# Patient Record
Sex: Female | Born: 1940 | Race: White | Hispanic: No | Smoking: Former smoker
Health system: Southern US, Community
[De-identification: ages and names within clinical notes are randomized; demographics above are authoritative.]

## PROBLEM LIST (undated history)

## (undated) DIAGNOSIS — I639 Cerebral infarction, unspecified: Secondary | ICD-10-CM

## (undated) DIAGNOSIS — D649 Anemia, unspecified: Secondary | ICD-10-CM

## (undated) DIAGNOSIS — E507 Other ocular manifestations of vitamin A deficiency: Secondary | ICD-10-CM

## (undated) DIAGNOSIS — R079 Chest pain, unspecified: Secondary | ICD-10-CM

## (undated) DIAGNOSIS — I776 Arteritis, unspecified: Secondary | ICD-10-CM

## (undated) DIAGNOSIS — E039 Hypothyroidism, unspecified: Secondary | ICD-10-CM

## (undated) DIAGNOSIS — H539 Unspecified visual disturbance: Secondary | ICD-10-CM

## (undated) DIAGNOSIS — I4891 Unspecified atrial fibrillation: Secondary | ICD-10-CM

## (undated) DIAGNOSIS — K219 Gastro-esophageal reflux disease without esophagitis: Secondary | ICD-10-CM

## (undated) DIAGNOSIS — E785 Hyperlipidemia, unspecified: Secondary | ICD-10-CM

## (undated) DIAGNOSIS — I10 Essential (primary) hypertension: Secondary | ICD-10-CM

## (undated) HISTORY — DX: Essential (primary) hypertension: I10

## (undated) HISTORY — PX: TONSILLECTOMY: SUR1361

## (undated) HISTORY — PX: CHOLECYSTECTOMY: SHX55

## (undated) HISTORY — DX: Hypothyroidism, unspecified: E03.9

## (undated) HISTORY — PX: OTHER SURGICAL HISTORY: SHX169

## (undated) HISTORY — DX: Chest pain, unspecified: R07.9

## (undated) HISTORY — DX: Unspecified visual disturbance: H53.9

## (undated) HISTORY — DX: Hyperlipidemia, unspecified: E78.5

## (undated) HISTORY — DX: Cerebral infarction, unspecified: I63.9

---

## 2016-08-21 DIAGNOSIS — I639 Cerebral infarction, unspecified: Secondary | ICD-10-CM

## 2016-08-21 HISTORY — DX: Cerebral infarction, unspecified: I63.9

## 2016-12-07 ENCOUNTER — Encounter: Payer: Self-pay | Admitting: *Deleted

## 2017-01-01 ENCOUNTER — Encounter: Payer: Self-pay | Admitting: Physician Assistant

## 2017-01-01 ENCOUNTER — Encounter (INDEPENDENT_AMBULATORY_CARE_PROVIDER_SITE_OTHER): Payer: Self-pay

## 2017-01-01 ENCOUNTER — Ambulatory Visit (INDEPENDENT_AMBULATORY_CARE_PROVIDER_SITE_OTHER): Payer: Medicare Other | Admitting: Physician Assistant

## 2017-01-01 VITALS — BP 130/74 | HR 94 | Ht 67.0 in | Wt 122.4 lb

## 2017-01-01 DIAGNOSIS — I10 Essential (primary) hypertension: Secondary | ICD-10-CM | POA: Insufficient documentation

## 2017-01-01 DIAGNOSIS — I48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 atrial fibrillation: Secondary | ICD-10-CM | POA: Insufficient documentation

## 2017-01-01 DIAGNOSIS — R0602 Shortness of breath: Secondary | ICD-10-CM | POA: Diagnosis not present

## 2017-01-01 DIAGNOSIS — R600 Localized edema: Secondary | ICD-10-CM | POA: Diagnosis not present

## 2017-01-01 DIAGNOSIS — E039 Hypothyroidism, unspecified: Secondary | ICD-10-CM | POA: Insufficient documentation

## 2017-01-01 DIAGNOSIS — I639 Cerebral infarction, unspecified: Secondary | ICD-10-CM

## 2017-01-01 DIAGNOSIS — I481 Persistent atrial fibrillation: Secondary | ICD-10-CM

## 2017-01-01 DIAGNOSIS — M341 CR(E)ST syndrome: Secondary | ICD-10-CM | POA: Diagnosis not present

## 2017-01-01 DIAGNOSIS — E785 Hyperlipidemia, unspecified: Secondary | ICD-10-CM | POA: Insufficient documentation

## 2017-01-01 MED ORDER — CARVEDILOL 12.5 MG PO TABS: 18.7500 mg | ORAL_TABLET | Freq: Two times a day (BID) | ORAL | 3 refills | Status: DC

## 2017-01-01 NOTE — Patient Instructions (Addendum)
Medication Instructions:  Your physician has recommended you make the following change in your medication:  1.  INCREASE the Coreg to 12.5 mg taking 1 1/2 tablet twice a day  Labwork: None orderd  Testing/Procedures: Your physician has requested that you have an echocardiogram 01/08/17 ARRIVE AT 2:45. Echocardiography is a painless test that uses sound waves to create images of your heart. It provides your doctor with information about the size and shape of your heart and how well your heart's chambers and valves are working. This procedure takes approximately one hour. There are no restrictions for this procedure.    Follow-Up: Your physician recommends that you schedule a follow-up appointment in: 01/14/17 ARRIVE AT 11:30  MICHELE LENZE, PA-C   Any Other Special Instructions Will Be Listed Below (If Applicable). Echocardiogram An echocardiogram, or echocardiography, uses sound waves (ultrasound) to produce an image of your heart. The echocardiogram is simple, painless, obtained within a short period of time, and offers valuable information to your health care provider. The images from an echocardiogram can provide information such as:  Evidence of coronary artery disease (CAD).  Heart size.  Heart muscle function.  Heart valve function.  Aneurysm detection.  Evidence of a past heart attack.  Fluid buildup around the heart.  Heart muscle thickening.  Assess heart valve function.  Tell a health care provider about:  Any allergies you have.  All medicines you are taking, including vitamins, herbs, eye drops, creams, and over-the-counter medicines.  Any problems you or family members have had with anesthetic medicines.  Any blood disorders you have.  Any surgeries you have had.  Any medical conditions you have.  Whether you are pregnant or may be pregnant. What happens before the procedure? No special preparation is needed. Eat and drink normally. What happens  during the procedure?  In order to produce an image of your heart, gel will be applied to your chest and a wand-like tool (transducer) will be moved over your chest. The gel will help transmit the sound waves from the transducer. The sound waves will harmlessly bounce off your heart to allow the heart images to be captured in real-time motion. These images will then be recorded.  You may need an IV to receive a medicine that improves the quality of the pictures. What happens after the procedure? You may return to your normal schedule including diet, activities, and medicines, unless your health care provider tells you otherwise. This information is not intended to replace advice given to you by your health care provider. Make sure you discuss any questions you have with your health care provider. Document Released: 04/06/2000 Document Revised: 11/26/2015 Document Reviewed: 12/15/2012 Elsevier Interactive Patient Education  2017 [employer]     If you need a refill on your cardiac medications before your next appointment, please call your pharmacy.

## 2017-01-01 NOTE — Progress Notes (Signed)
Cardiology Office Note    Date:  01/01/2017   ID:  [name], DOB 1940-09-28, MRN [id number]  PCP:  No primary care provider on file.  Cardiologist: New  Chief Complaint  Patient presents with  . New Patient (Initial Visit)    History of Present Illness:    Kathryn DollySharon Dermody is a 76 y.o. female who is being seen today for the evaluation of Atrial fibrillation, HTN,? CHF at the request of Alois Clicheguilar, [place], [place].  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 Patient is living in long-term care facility and hasn't seen a cardiologist in a long time. She was diagnosed with atrial fibrillation and placed on Eliquis 5 mg BID. She was seen by primary care in the long-term care facility and had some peripheral edema and was referred for question of heart failure.  Back in 07/2016 the patient had traumatic brain injury With CVA where she fell home. She was living with her husband at the time of had dementia and did not call EMS right away. It's unknown how long she had a stroke before she was sent to the ER where she was diagnosed with ischemic right MCA stroke. In 09/2016 she suffered another stroke was diagnosed with aphasia. All of the history is taken from referral records.  Patient comes in today alone. Has memory problems and can't tell me where she lived before or if she's had any heart problems. Has no memory whatsoever. Denies any chest pain, palpitations, dyspnea, dyspnea on exertion, dizziness or presyncope. She works with  rehabilitation and says her son lives in town. She says they tell her she short of breath but she doesn't feel like she is short of breath. She doesn't feels her heart racing at all. She doesn't know that she's had atrial fibrillation.  Past Medical History:  Diagnosis Date  . Chest pain syndrome   . Hyperlipidemia   . Hypertension   . Hypothyroidism     Past  Surgical History:  Procedure Laterality Date  . TONSILLECTOMY      Current Medications: Current Meds  Medication Sig  . apixaban (ELIQUIS) 5 MG TABS tablet Take 5 mg by mouth 2 (two) times daily.  Marland Kitchen. aspirin EC 81 MG tablet Take 81 mg by mouth daily.  Marland Kitchen. atorvastatin (LIPITOR) 40 MG tablet Take 40 mg by mouth daily.  . calcium carbonate (OS-CAL - DOSED IN MG OF ELEMENTAL CALCIUM) 1250 (500 Ca) MG tablet Take 1 tablet by mouth daily with breakfast.  . ergocalciferol (VITAMIN D2) 50000 units capsule Take 50,000 Units by mouth once a week.  . ferrous sulfate 325 (65 FE) MG tablet Take 325 mg by mouth daily with breakfast.  . Levothyroxine Sodium (TIROSINT) 88 MCG CAPS Take 88 mcg by mouth daily before breakfast.  . ramipril (ALTACE) 5 MG capsule Take 5 mg by mouth daily.  . vitamin B-12 (CYANOCOBALAMIN) 500 MCG tablet Take 500 mcg by mouth daily.  . [DISCONTINUED] carvedilol (COREG) 12.5 MG tablet Take 12.5 mg by mouth 2 (two) times daily with a meal.     Allergies:   Sulfa antibiotics   Social History   Social History  . Marital status: Widowed    Spouse name: N/A  . Number of children: N/A  . Years of education: N/A   Social History Main Topics  . Smoking status: Former Games developermoker  . Smokeless tobacco: Never Used  . Alcohol use No  .  Drug use: No  . Sexual activity: Not Asked   Other Topics Concern  . None   Social History Narrative  . None     Family History:  The patientFamily history is unknown by patient.   ROS:   Please see the history of present illness.    Review of Systems  Reason unable to perform ROS: Patient had a stroke and can't answer these questions.   All other systems reviewed and are negative.   PHYSICAL EXAM:   VS:  BP 130/74   Pulse 94   Ht  (1.702 m)   Wt 122 lb 6.4 oz (55.5 kg)   SpO2 90%   BMI 19.17 kg/m   Physical Exam  GEN: Well nourished, well developed, in no acute distress  HEENT: normal  Neck: no JVD, carotid bruits, or  masses Cardiac:Irregular irregular with 2/6 systolic murmur at the left sternal border no rubs, or gallops  Respiratory:  clear to auscultation bilaterally, normal work of breathing GI: soft, nontender, nondistended, + BS Ext: +1 edema bilaterally without cyanosis, clubbing. Good distal pulses bilaterally MS: no deformity or atrophy  Neuro:  Alert slightly a phasic but can speak pretty well. Discussed has no memory Psych: euthymic mood   Wt Readings from Last 3 Encounters:  01/01/17 122 lb 6.4 oz (55.5 kg)      Studies/Labs Reviewed:   EKG:  EKG is  ordered today.  The ekg ordered today demonstrates Atrial fibrillation at 94 bpm  Recent Labs: No results found for requested labs within last 8760 hours.   Lipid Panel No results found for: CHOL, TRIG, HDL, CHOLHDL, VLDL, LDLCALC, LDLDIRECT  Additional studies/ records that were reviewed today include:  Records reviewed from nursing home.    ASSESSMENT:    1. Lower extremity edema   2. SOB (shortness of breath)   3. Persistent atrial fibrillation (HCC)   4. Essential hypertension   5. Cerebrovascular accident (CVA), unspecified mechanism (HCC)   6. CREST syndrome (HCC)   7. Hyperlipidemia, unspecified hyperlipidemia type      PLAN:  In order of problems listed above:  Lower extremity edema and shortness of breath but patient doesn't complain of this. History taken from notes from nursing facility. Patient apparently has a prior cardiac history but has no recall because of her CVA and had no one accompany her today. Discussed in detail with Dr.Nahser she could have some edema because her atrial fibrillation heart rate is not controlled. Will increase Coreg to 18.75 mg twice a day. Check 2-D echo for LV function. Come back for follow-up after echo. Hopefully a family member will accompany her.  Atrial fibrillation is to be chronic rate a little fast. Will increase Coreg as dictated above. Continue Eliquis.  Essential  hypertension blood pressure controlled on low-dose Altace  CVA 24/2018 in 09/2016 notes indicate ischemic right MCA stroke but there are no records in care everywhere and she doesn't know where she was living her hospitalized. On aspirin as well   Crest syndrome managed by primary care  Hyperlipidemia on Lipitor managed by primary care.    Medication Adjustments/Labs and Tests Ordered: Current medicines are reviewed at length with the patient today.  Concerns regarding medicines are outlined above.  Medication changes, Labs and Tests ordered today are listed in the Patient Instructions below. Patient Instructions  Medication Instructions:  Your physician has recommended you make the following change in your medication:  1.  INCREASE the Coreg to 12.5 mg taking  1 1/2 tablet twice a day  Labwork: None orderd  Testing/Procedures: Your physician has requested that you have an echocardiogram 01/08/17 ARRIVE AT 2:45. Echocardiography is a painless test that uses sound waves to create images of your heart. It provides your doctor with information about the size and shape of your heart and how well your heart's chambers and valves are working. This procedure takes approximately one hour. There are no restrictions for this procedure.    Follow-Up: Your physician recommends that you schedule a follow-up appointment in: 01/14/17 ARRIVE AT 11:30  MICHELE LENZE, PA-C   Any Other Special Instructions Will Be Listed Below (If Applicable). Echocardiogram An echocardiogram, or echocardiography, uses sound waves (ultrasound) to produce an image of your heart. The echocardiogram is simple, painless, obtained within a short period of time, and offers valuable information to your health care provider. The images from an echocardiogram can provide information such as:  Evidence of coronary artery disease (CAD).  Heart size.  Heart muscle function.  Heart valve function.  Aneurysm  detection.  Evidence of a past heart attack.  Fluid buildup around the heart.  Heart muscle thickening.  Assess heart valve function.  Tell a health care provider about:  Any allergies you have.  All medicines you are taking, including vitamins, herbs, eye drops, creams, and over-the-counter medicines.  Any problems you or family members have had with anesthetic medicines.  Any blood disorders you have.  Any surgeries you have had.  Any medical conditions you have.  Whether you are pregnant or may be pregnant. What happens before the procedure? No special preparation is needed. Eat and drink normally. What happens during the procedure?  In order to produce an image of your heart, gel will be applied to your chest and a wand-like tool (transducer) will be moved over your chest. The gel will help transmit the sound waves from the transducer. The sound waves will harmlessly bounce off your heart to allow the heart images to be captured in real-time motion. These images will then be recorded.  You may need an IV to receive a medicine that improves the quality of the pictures. What happens after the procedure? You may return to your normal schedule including diet, activities, and medicines, unless your health care provider tells you otherwise. This information is not intended to replace advice given to you by your health care provider. Make sure you discuss any questions you have with your health care provider. Document Released: 04/06/2000 Document Revised: 11/26/2015 Document Reviewed: 12/15/2012 Elsevier Interactive Patient Education  2017 [employer].     If you need a refill on your cardiac medications before your next appointment, please call your pharmacy.      Signed, Jacolyn Reedy, PA-C  01/01/2017 2:12 PM    [facility] [address] [place], [place], [place]  [address] Phone: [phone]; Fax: [phone]

## 2017-01-08 ENCOUNTER — Other Ambulatory Visit (HOSPITAL_COMMUNITY): Payer: Medicare Other

## 2017-01-14 ENCOUNTER — Ambulatory Visit: Payer: Medicare Other | Admitting: Physician Assistant

## 2017-01-22 ENCOUNTER — Encounter: Payer: Self-pay | Admitting: Neurology

## 2017-01-22 ENCOUNTER — Ambulatory Visit (INDEPENDENT_AMBULATORY_CARE_PROVIDER_SITE_OTHER): Payer: Medicare Other | Admitting: Neurology

## 2017-01-22 VITALS — BP 96/54 | HR 64 | Ht 67.0 in | Wt 115.8 lb

## 2017-01-22 DIAGNOSIS — I63411 Cerebral infarction due to embolism of right middle cerebral artery: Secondary | ICD-10-CM | POA: Diagnosis not present

## 2017-01-22 NOTE — Patient Instructions (Signed)
Remember to drink plenty of fluid, eat healthy meals and do not skip any meals. Try to eat protein with a every meal and eat a healthy snack such as fruit or nuts in between meals. Try to keep a regular sleep-wake schedule and try to exercise daily, particularly in the form of walking, 20-30 minutes a day, if you can.   As far as your medications are concerned, I would like to suggest: Continue current medications  I would like to see you back in 1 year, sooner if we need to. Please call us with any interim questions, concerns, problems, updates or refill requests.   Our phone number is [phone]. We also have an after hours call service for urgent matters and there is a physician on-call for urgent questions. For any emergencies you know to call 911 or go to the nearest emergency room   

## 2017-01-22 NOTE — Progress Notes (Signed)
[facility]    Provider:  Dr Lucia Gaskins Referring Provider: Alois Cliche, PA-C Primary Care Physician:  Alois Cliche, PA-C  CC:  PMHx stroke, already on Eliquis and Aspirin  HPI:  [name] is a 75 y.o. female here as a referral from Dr. Cephus Richer for strokes. 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 Patient is on long-term anticoagulation with Apixaban and is also on aspirin. She also has a past medical history of crest syndrome, she has not seen a rheumatologist for years. 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 She has hypertension, hypothyroidism, hyperlipidemia, trouble swallowing since her last stroke, atrial fibrillation and embolus to the middle cerebral artery, frequent falls while walking, gait abnormality,. She is on Lipitor for hyperlipidemia and is managed with hypertension medications. I have very limited documentation. She doesn't appear to be having new symptoms and is currently on anticoagulation and antiplatelet therapy which would be very difficult to improve on. Patient suffers from mild aphasia secondary to CVA and possibly memory loss sp limited history. Most information from chart. Patient says she had her stroke somewhere in [place], she doesn't remember. She feels she is doing well, she feels very well. She has a lack of being able "to hear everything on her brain", She can't remember the name of her facility. She is [facility]. She feels depressed. Otherwise feels her strength has improved. No other focal neurologic deficits, associated symptoms, inciting events or modifiable factors. Patient is here alone.  Reviewed notes, labs and imaging from outside physicians, which showed:  Personally reviewed notes, patient has suffered from aphasia secondary to CVA which is chronic. Patient had a traumatic brain injury when she fell at home in April 2018, she was living  with her husband at the time who has dementia and he did not call EMS right away did not realize what was going on, it unknown how long she had the stroke before she was sent to the emergency room, she was diagnosed with an ischemic right MCA stroke. She has crest syndrome. She has hypertension. She has not seen a rheumatologist for years. Patient has hypothyroidism as well and several other medical problems listed above in HPI. PT OT services are ordered. Speech therapy and swallow evaluation has been ordered. Occupational therapy has been ordered for evaluation and treatment of ADLs. She was placed on a mechanical soft diet, she was referred to neurology for embolic ischemic stroke. She was also referred to cardiology, ophthalmology and rheumatology.  Review of Systems: Patient complains of symptoms per HPI as well as the following symptoms: difficulty walking. Pertinent negatives and positives per HPI. All others negative.   Social History   Social History  . Marital status: Legally Separated    Spouse name: N/A  . Number of children: 2  . Years of education: 16   Occupational History  .      retired   Social History Main Topics  . Smoking status: Former Games developer  . Smokeless tobacco: Never Used  . Alcohol use No  . Drug use: No  . Sexual activity: Not on file   Other Topics Concern  . Not on file   Social History Narrative   01/22/17 lives at [facility]    Family History  Problem Relation Age of Onset  . Family history unknown: Yes    Past Medical History:  Diagnosis Date  . Chest pain syndrome   . Hyperlipidemia   .  Hypertension   . Hypothyroidism   . Stroke (HCC) 08/2016  . Vision abnormalities    as result of stroke    Past Surgical History:  Procedure Laterality Date  . CHOLECYSTECTOMY    . leg surgery    . TONSILLECTOMY      Current Outpatient Prescriptions  Medication Sig Dispense Refill  . apixaban (ELIQUIS) 5 MG TABS tablet Take 5 mg by mouth 2  (two) times daily.    Marland Kitchen aspirin EC 81 MG tablet Take 81 mg by mouth daily.    Marland Kitchen atorvastatin (LIPITOR) 40 MG tablet Take 40 mg by mouth daily.    . calcium carbonate (OS-CAL - DOSED IN MG OF ELEMENTAL CALCIUM) 1250 (500 Ca) MG tablet Take 1 tablet by mouth daily with breakfast.    . carvedilol (COREG) 12.5 MG tablet Take 1.5 tablets (18.75 mg total) by mouth 2 (two) times daily. 180 tablet 3  . ergocalciferol (VITAMIN D2) 50000 units capsule Take 50,000 Units by mouth once a week.    . ferrous sulfate 325 (65 FE) MG tablet Take 325 mg by mouth daily with breakfast.    . Levothyroxine Sodium (TIROSINT) 88 MCG CAPS Take 88 mcg by mouth daily before breakfast.    . Loperamide HCl (IMODIUM A-D PO) Take by mouth as needed.    . ramipril (ALTACE) 5 MG capsule Take 5 mg by mouth daily.    . vitamin B-12 (CYANOCOBALAMIN) 500 MCG tablet Take 500 mcg by mouth daily.     No current facility-administered medications for this visit.     Allergies as of 01/22/2017 - Review Complete 01/22/2017  Allergen Reaction Noted  . Sulfa antibiotics Other (See Comments) 01/01/2017    Vitals: BP (!) 96/54   Pulse 64   Ht  (1.702 m)   Wt 115 lb 12.8 oz (52.5 kg)   BMI 18.14 kg/m  Last Weight:  Wt Readings from Last 1 Encounters:  01/22/17 115 lb 12.8 oz (52.5 kg)   Last Height:   Ht Readings from Last 1 Encounters:  01/22/17  (1.702 m)   Physical exam: Exam: Gen: NAD, conversan                 CV: irregular, no MRG. No Carotid Bruits. No peripheral edema, warm, nontender Eyes: Conjunctivae clear without exudates or hemorrhage  Neuro: Detailed Neurologic Exam  Speech:    She has some expressive and receptive aphasia however able to have a conversation and discuss how she feels Cognition:    The patient is oriented to person only    recent and remote memory Impaired;     Impaired attention, concentration,fund of knowledge  .mmse Cranial Nerves:    The pupils are equal, round, and  reactive to light. Attempted fundoscopic exam could not visualize. left homonomous hemianopia.. Extraocular movements are intact. Trigeminal sensation is intact and the muscles of mastication are normal. The face is symmetric. The palate elevates in the midline. Hearing intact. Voice is normal. Shoulder shrug is normal. The tongue has normal motion without fasciculations.   Coordination and gait: No dymetria,  Impaired, needs walker, good strides and stable with walker however small steps and imbalance without walker.  Motor Observation:    no involuntary movements noted. Tone:    Normal muscle tone.    Posture:    erect    Strength:    Strength is dimoinished throughout 3+-4/5 moreso on the left, difficult motor exam due to aphasia and cognitive impairments  Sensation: intact to LT     Reflex Exam:  DTR's:    Deep tendon reflexes in the upper and lower extremities are symmetrical bilaterally.   Toes:    The toes are equival bilaterally.   Clonus:    Clonus is absent.       Assessment/Plan:     76 y.o. female here as a refe60rral from Dr. Cephus Richer for strokes. 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   Hypotension: She is hypotensive today, needs evaluation of her blood pressure medications and monitoring of fluids Swallowing difficult: She is on a soft diet and is being referred to speech and swallow Weakness and difficulty with ADLs: She has PT and OT currently Aphasia: Speech therapy Afib: Continue Eliquis, cardilogy was consulted by pcp Stroke prevention: Continue Eliquis and asa.  Fall precautions  F/u one year.  Cc: Alois Cliche, PA-C  Naomie Dean, MD  [facility] [address] [place], [place] [address]  Phone [phone] Fax [phone]

## 2017-01-24 ENCOUNTER — Other Ambulatory Visit (HOSPITAL_COMMUNITY): Payer: Medicare Other

## 2017-01-29 ENCOUNTER — Other Ambulatory Visit: Payer: Self-pay

## 2017-01-29 ENCOUNTER — Ambulatory Visit (HOSPITAL_COMMUNITY): Payer: Medicare Other | Attending: Internal Medicine

## 2017-01-29 DIAGNOSIS — I10 Essential (primary) hypertension: Secondary | ICD-10-CM | POA: Diagnosis not present

## 2017-01-29 DIAGNOSIS — R600 Localized edema: Secondary | ICD-10-CM | POA: Insufficient documentation

## 2017-01-29 DIAGNOSIS — E785 Hyperlipidemia, unspecified: Secondary | ICD-10-CM | POA: Insufficient documentation

## 2017-01-29 DIAGNOSIS — I081 Rheumatic disorders of both mitral and tricuspid valves: Secondary | ICD-10-CM | POA: Diagnosis not present

## 2017-01-29 DIAGNOSIS — Z87891 Personal history of nicotine dependence: Secondary | ICD-10-CM | POA: Insufficient documentation

## 2017-01-29 DIAGNOSIS — R0602 Shortness of breath: Secondary | ICD-10-CM

## 2017-01-29 DIAGNOSIS — R079 Chest pain, unspecified: Secondary | ICD-10-CM | POA: Insufficient documentation

## 2017-01-29 DIAGNOSIS — Z8673 Personal history of transient ischemic attack (TIA), and cerebral infarction without residual deficits: Secondary | ICD-10-CM | POA: Insufficient documentation

## 2017-01-30 ENCOUNTER — Telehealth: Payer: Self-pay | Admitting: Physician Assistant

## 2017-01-30 NOTE — Telephone Encounter (Signed)
-----   Message from Dyann Kief, PA-C sent at 01/30/2017  7:50 AM EDT ----- 2-D echo shows good heart function, mild LVH. Keep follow-up.

## 2017-01-30 NOTE — Telephone Encounter (Signed)
New message    Pt son is returning call.

## 2017-01-30 NOTE — Telephone Encounter (Signed)
Patient's son (DPR on file) made aware of results. Son verbalizes understanding and plans to keep follow up appointment on 10/16.

## 2017-02-04 NOTE — Progress Notes (Signed)
Cardiology Office Note    Date:  02/05/2017   ID:  Rickey Farrier, DOB 1940/06/30, MRN [id number]  PCP:  Patient, No Pcp Per  Cardiologist:  New Dr. Elease Hashimoto  Chief Complaint  Patient presents with  . Atrial Fibrillation    History of Present Illness:  [name] is a 76 y.o. female who I saw for the first time 01/01/17 for evaluation of atrial fibrillation and question of CHF. 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 She is living in a long-term care facility and was diagnosed with atrial fibrillation and placed on Eliquis 5 mg BID. 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 Her husband with dementia did not call EMS right away. It's unknown how long she had a stroke before she went to the emergency room. In 09/2016 she suffered another stroke and was diagnosed with a fascia.  When I saw her she came to the office alone. She has memory problems and couldn't tell me anything. All history was taken from notes from nursing facility. I discussed her with Dr. Elease Hashimoto who felt she could have some edema because her atrial fibrillation heart rate was not controlled. We increased her Coreg to 18.75 mg twice a day and ordered a 2-D echo which showed normal LVEF 55-60%, mild LVH, mild MR, moderate LAE.   Patient comes into the office today alone once again. Her driver brought the records of another patient so she has no records with her. The driver left. She says she sometimes has anxiety and has to calm herself down but overall she denies any fast heart rates, swelling, shortness of breath. We called [facility] and they don't take her pulse only her blood pressure. We have no orders to sign.     Past Medical History:  Diagnosis Date  . Chest pain syndrome   . Hyperlipidemia   . Hypertension   . Hypothyroidism   . Stroke (HCC) 08/2016  . Vision abnormalities    as result of stroke     Past Surgical History:  Procedure Laterality Date  . CHOLECYSTECTOMY    . leg surgery    . TONSILLECTOMY      Current Medications: Current Meds  Medication Sig  . apixaban (ELIQUIS) 5 MG TABS tablet Take 5 mg by mouth 2 (two) times daily.  Marland Kitchen aspirin EC 81 MG tablet Take 81 mg by mouth daily.  Marland Kitchen atorvastatin (LIPITOR) 40 MG tablet Take 40 mg by mouth daily.  . calcium carbonate (OS-CAL - DOSED IN MG OF ELEMENTAL CALCIUM) 1250 (500 Ca) MG tablet Take 1 tablet by mouth daily with breakfast.  . carvedilol (COREG) 12.5 MG tablet Take 1.5 tablets (18.75 mg total) by mouth 2 (two) times daily.  . ergocalciferol (VITAMIN D2) 50000 units capsule Take 50,000 Units by mouth once a week.  . ferrous sulfate 325 (65 FE) MG tablet Take 325 mg by mouth daily with breakfast.  . Levothyroxine Sodium (TIROSINT) 88 MCG CAPS Take 88 mcg by mouth daily before breakfast.  . Loperamide HCl (IMODIUM A-D PO) Take by mouth as needed.  . vitamin B-12 (CYANOCOBALAMIN) 500 MCG tablet Take 500 mcg by mouth daily.  . [DISCONTINUED] ramipril (ALTACE) 5 MG capsule Take 5 mg by mouth daily.     Allergies:   Sulfa antibiotics   Social History   Social History  . Marital status: Legally Separated  Spouse name: N/A  . Number of children: 2  . Years of education: 16   Occupational History  .      retired   Social History Main Topics  . Smoking status: Former Games developer  . Smokeless tobacco: Never Used  . Alcohol use No  . Drug use: No  . Sexual activity: Not Asked   Other Topics Concern  . None   Social History Narrative   01/22/17 lives at [facility]     Family History:  The patient's   Family history is unknown by patient.   ROS:   Please see the history of present illness.    Review of Systems  Reason unable to perform ROS: CVA.   All other systems reviewed and are negative.   PHYSICAL EXAM:   VS:  BP (!) 80/60   Pulse 72   Resp 16   Ht  (1.676 m)   Wt 116 lb (52.6 kg)   BMI  18.72 kg/m   Physical Exam  GEN: Thin, in no acute distress  Neck: no JVD, carotid bruits, or masses Cardiac:irreg; no murmurs, rubs, or gallops  Respiratory:  clear to auscultation bilaterally, normal work of breathing GI: soft, nontender, nondistended, + BS Ext: without cyanosis, clubbing, or edema, Good distal pulses bilaterally Neuro:  Alert and Oriented x 3, Strength and sensation are intact Psych: euthymic mood, full affect  Wt Readings from Last 3 Encounters:  02/05/17 116 lb (52.6 kg)  01/22/17 115 lb 12.8 oz (52.5 kg)  01/01/17 122 lb 6.4 oz (55.5 kg)      Studies/Labs Reviewed:   EKG:  EKG is not ordered today.    Recent Labs: No results found for requested labs within last 8760 hours.   Lipid Panel No results found for: CHOL, TRIG, HDL, CHOLHDL, VLDL, LDLCALC, LDLDIRECT  Additional studies/ records that were reviewed today include:  2-D echo 10/9/18Study Conclusions   - Left ventricle: The cavity size was normal. Wall thickness was   increased in a pattern of mild LVH. Systolic function was normal.   The estimated ejection fraction was in the range of 55% to 60%.   Wall motion was normal; there were no regional wall motion   abnormalities. The study is not technically sufficient to allow   evaluation of LV diastolic function. - Mitral valve: Mildly thickened leaflets . There was mild   regurgitation. - Left atrium: Moderately dilated. - Right atrium: The atrium was mildly dilated. - Tricuspid valve: There was mild regurgitation. - Pulmonary arteries: PA peak pressure: 18 mm Hg (S). - Inferior vena cava: The vessel was normal in size. The   respirophasic diameter changes were in the normal range (>= 50%),   consistent with normal central venous pressure.   Impressions:   - LVEF 55-60%, mild LVH, normal wall motion, mild MR, moderate LAE,   mild RAE, mild TR, RVSP 18 mmHg, normal IVC.     ASSESSMENT:    1. Persistent atrial fibrillation (HCC)   2.  Essential hypertension   3. 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 CREST syndrome (HCC)   5. Hyperlipidemia, unspecified hyperlipidemia type      PLAN:  In order of problems listed above:  Atrial fibrillation on Eliquis and Coreg 18.75 mg BID controlled rate. F/U as needed. Can be monitored by nursing facility.  Essential hypertension blood pressure low decrease Altace to 2.5 mg daily  Embolic stroke with aphasia  CREST syndrome managed by  primary care.  Hyperlipidemia on lipitor    Medication Adjustments/Labs and Tests Ordered: Current medicines are reviewed at length with the patient today.  Concerns regarding medicines are outlined above.  Medication changes, Labs and Tests ordered today are listed in the Patient Instructions below. There are no Patient Instructions on file for this visit.   Elson Clan, PA-C  02/05/2017 10:47 AM    [facility] [address] [place], [place], [place]  [address] Phone: [phone]; Fax: [phone]

## 2017-02-05 ENCOUNTER — Ambulatory Visit (INDEPENDENT_AMBULATORY_CARE_PROVIDER_SITE_OTHER): Payer: Medicare Other | Admitting: Physician Assistant

## 2017-02-05 ENCOUNTER — Encounter: Payer: Self-pay | Admitting: Physician Assistant

## 2017-02-05 VITALS — BP 80/60 | HR 72 | Resp 16 | Ht 66.0 in | Wt 116.0 lb

## 2017-02-05 DIAGNOSIS — I481 Persistent atrial fibrillation: Secondary | ICD-10-CM

## 2017-02-05 DIAGNOSIS — I63411 Cerebral infarction due to embolism of right middle cerebral artery: Secondary | ICD-10-CM | POA: Diagnosis not present

## 2017-02-05 DIAGNOSIS — M341 CR(E)ST syndrome: Secondary | ICD-10-CM

## 2017-02-05 DIAGNOSIS — E785 Hyperlipidemia, unspecified: Secondary | ICD-10-CM

## 2017-02-05 DIAGNOSIS — I10 Essential (primary) hypertension: Secondary | ICD-10-CM

## 2017-02-05 DIAGNOSIS — I48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 atrial fibrillation: Secondary | ICD-10-CM

## 2017-02-05 MED ORDER — RAMIPRIL 2.5 MG PO CAPS: 2.5000 mg | ORAL_CAPSULE | Freq: Every day | ORAL | 3 refills | Status: DC

## 2017-02-05 NOTE — Patient Instructions (Signed)
Your physician has recommended you make the following change in your medication:  1) change ramipril (Altace) to 2.5 mg once a day  Check pulse with blood pressure daily   Your physician recommends that you schedule a follow-up appointment as needed.

## 2017-08-29 ENCOUNTER — Other Ambulatory Visit (HOSPITAL_COMMUNITY): Payer: Self-pay | Admitting: Family Medicine

## 2017-08-29 DIAGNOSIS — R1319 Other dysphagia: Secondary | ICD-10-CM

## 2017-09-05 ENCOUNTER — Ambulatory Visit (HOSPITAL_COMMUNITY)
Admission: RE | Admit: 2017-09-05 | Discharge: 2017-09-05 | Disposition: A | Discharge status: Home / Self Care | Payer: Medicare Other | Source: Ambulatory Visit | Attending: Family Medicine | Admitting: Family Medicine

## 2017-09-05 ENCOUNTER — Ambulatory Visit (HOSPITAL_COMMUNITY): Payer: Medicare Other

## 2017-09-05 ENCOUNTER — Encounter (HOSPITAL_COMMUNITY): Payer: Medicare Other

## 2017-09-05 DIAGNOSIS — R1319 Other dysphagia: Secondary | ICD-10-CM

## 2017-12-05 ENCOUNTER — Other Ambulatory Visit: Payer: Self-pay | Admitting: Physician Assistant

## 2018-01-22 NOTE — Progress Notes (Deleted)
[facility]  PATIENT: [name] DOB: February 01, 1941   REASON FOR VISIT: *** HISTORY FROM:    HISTORY OF PRESENT ILLNESS:  Shoua Joseph is a 77 y.o. female here as a referral from Dr. Cephus Richer for strokes. 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 Patient is on long-term anticoagulation with Apixaban and is also on aspirin. She also has a past medical history of crest syndrome, she has not seen a rheumatologist for years. 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 She has hypertension, hypothyroidism, hyperlipidemia, trouble swallowing since her last stroke, atrial fibrillation and embolus to the middle cerebral artery, frequent falls while walking, gait abnormality,. She is on Lipitor for hyperlipidemia and is managed with hypertension medications. I have very limited documentation. She doesn't appear to be having new symptoms and is currently on anticoagulation and antiplatelet therapy which would be very difficult to improve on. Patient suffers from mild aphasia secondary to CVA and possibly memory loss sp limited history. Most information from chart. Patient says she had her stroke somewhere in [place], she doesn't remember. She feels she is doing well, she feels very well. She has a lack of being able "to hear everything on her brain", She can't remember the name of her facility. She is [facility]. She feels depressed. Otherwise feels her strength has improved. No other focal neurologic deficits, associated symptoms, inciting events or modifiable factors. Patient is here alone REVIEW OF SYSTEMS: Full 14 system review of systems performed and notable only for those listed, all others are neg:  Constitutional: neg  Cardiovascular: neg Ear/Nose/Throat: neg  Skin: neg Eyes: neg Respiratory: neg Gastroitestinal: neg  Hematology/Lymphatic: neg  Endocrine:  neg Musculoskeletal:neg Allergy/Immunology: neg Neurological: neg Psychiatric: neg Sleep : neg   ALLERGIES: Allergies  Allergen Reactions  . Sulfa Antibiotics Other (See Comments)    Don't remember reaction    HOME MEDICATIONS: Outpatient Medications Prior to Visit  Medication Sig Dispense Refill  . apixaban (ELIQUIS) 5 MG TABS tablet Take 5 mg by mouth 2 (two) times daily.    Marland Kitchen aspirin EC 81 MG tablet Take 81 mg by mouth daily.    Marland Kitchen atorvastatin (LIPITOR) 40 MG tablet Take 40 mg by mouth daily.    . calcium carbonate (OS-CAL - DOSED IN MG OF ELEMENTAL CALCIUM) 1250 (500 Ca) MG tablet Take 1 tablet by mouth daily with breakfast.    . carvedilol (COREG) 12.5 MG tablet GIVE 1.5 TABS (18.75MG ) BY MOUTH TWICE DAILY 270 tablet 0  . ergocalciferol (VITAMIN D2) 50000 units capsule Take 50,000 Units by mouth once a week.    . ferrous sulfate 325 (65 FE) MG tablet Take 325 mg by mouth daily with breakfast.    . Levothyroxine Sodium (TIROSINT) 88 MCG CAPS Take 88 mcg by mouth daily before breakfast.    . Loperamide HCl (IMODIUM A-D PO) Take by mouth as needed.    . ramipril (ALTACE) 2.5 MG capsule Take 1 capsule (2.5 mg total) by mouth daily. 90 capsule 3  . vitamin B-12 (CYANOCOBALAMIN) 500 MCG tablet Take 500 mcg by mouth daily.     No facility-administered medications prior to visit.     PAST MEDICAL HISTORY: Past Medical History:  Diagnosis Date  . Chest pain syndrome   . Hyperlipidemia   . Hypertension   . Hypothyroidism   . Stroke (HCC) 08/2016  . Vision abnormalities    as result of stroke  PAST SURGICAL HISTORY: Past Surgical History:  Procedure Laterality Date  . CHOLECYSTECTOMY    . leg surgery    . TONSILLECTOMY      FAMILY HISTORY: Family History  Family history unknown: Yes    SOCIAL HISTORY: Social History   Socioeconomic History  . Marital status: Legally Separated    Spouse name: Not on file  . Number of children: 2  . Years of education: 20   . Highest education level: Not on file  Occupational History    Comment: retired  Engineer, production  . Financial resource strain: Not on file  . Food insecurity:    Worry: Not on file    Inability: Not on file  . Transportation needs:    Medical: Not on file    Non-medical: Not on file  Tobacco Use  . Smoking status: Former Games developer  . Smokeless tobacco: Never Used  Substance and Sexual Activity  . Alcohol use: No  . Drug use: No  . Sexual activity: Not on file  Lifestyle  . Physical activity:    Days per week: Not on file    Minutes per session: Not on file  . Stress: Not on file  Relationships  . Social connections:    Talks on phone: Not on file    Gets together: Not on file    Attends religious service: Not on file    Active member of club or organization: Not on file    Attends meetings of clubs or organizations: Not on file    Relationship status: Not on file  . Intimate partner violence:    Fear of current or ex partner: Not on file    Emotionally abused: Not on file    Physically abused: Not on file    Forced sexual activity: Not on file  Other Topics Concern  . Not on file  Social History Narrative   01/22/17 lives at [facility]     PHYSICAL EXAM  There were no vitals filed for this visit. There is no height or weight on file to calculate BMI.  Generalized: Well developed, in no acute distress  Head: normocephalic and atraumatic,. Oropharynx benign  Neck: Supple, no carotid bruits  Cardiac: Regular rate rhythm, no murmur  Musculoskeletal: No deformity   Neurological examination   Mentation: Alert oriented to time, place, history taking. Attention span and concentration appropriate. Recent and remote memory intact.  Follows all commands speech and language fluent.   Cranial nerve II-XII: Fundoscopic exam reveals sharp disc margins.Pupils were equal round reactive to light extraocular movements were full, visual field were full on confrontational test.  Facial sensation and strength were normal. hearing was intact to finger rubbing bilaterally. Uvula tongue midline. head turning and shoulder shrug were normal and symmetric.Tongue protrusion into cheek strength was normal. Motor: normal bulk and tone, full strength in the BUE, BLE, fine finger movements normal, no pronator drift. No focal weakness Sensory: normal and symmetric to light touch, pinprick, and  Vibration, proprioception  Coordination: finger-nose-finger, heel-to-shin bilaterally, no dysmetria Reflexes: Brachioradialis 2/2, biceps 2/2, triceps 2/2, patellar 2/2, Achilles 2/2, plantar responses were flexor bilaterally. Gait and Station: Rising up from seated position without assistance, normal stance,  moderate stride, good arm swing, smooth turning, able to perform tiptoe, and heel walking without difficulty. Tandem gait is steady  DIAGNOSTIC DATA (LABS, IMAGING, TESTING) - I reviewed patient records, labs, notes, testing and imaging myself where available.  No results found for: WBC, HGB, HCT, MCV, PLT  No results found for: NA, K, CL, CO2, GLUCOSE, BUN, CREATININE, CALCIUM, PROT, ALBUMIN, AST, ALT, ALKPHOS, BILITOT, GFRNONAA, GFRAA No results found for: CHOL, HDL, LDLCALC, LDLDIRECT, TRIG, CHOLHDL No results found for: ZOXW9U No results found for: VITAMINB12 No results found for: TSH  ***  ASSESSMENT AND PLAN  77 y.o. year old female  has a past medical history of Chest pain syndrome, Hyperlipidemia, Hypertension, Hypothyroidism, Stroke (HCC) (08/2016), and Vision abnormalities. here with ***  77 y.o. female here as a referral from Dr. Cephus Richer for strokes. 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   Hypotension: She  is hypotensive today, needs evaluation of her blood pressure medications and monitoring of fluids Swallowing difficult: She is on a soft diet and is being referred to speech and swallow Weakness and difficulty with ADLs: She has PT and OT currently Aphasia: Speech therapy Afib: Continue Eliquis, cardilogy was consulted by pcp Stroke prevention: Continue Eliquis and asa.  Fall precautions  F/u one year.  Nilda Riggs, [facility], [facility], APRN  [facility] [address], [address] [place], [place] [address] [phone]

## 2018-01-23 ENCOUNTER — Ambulatory Visit: Payer: Medicare Other | Admitting: Nurse Practitioner

## 2018-06-12 ENCOUNTER — Emergency Department (HOSPITAL_COMMUNITY): Payer: Medicare Other

## 2018-06-12 ENCOUNTER — Emergency Department (HOSPITAL_COMMUNITY)
Admission: EM | Admit: 2018-06-12 | Discharge: 2018-06-12 | Disposition: A | Discharge status: Home / Self Care | Payer: Medicare Other | Attending: Emergency Medicine | Admitting: Emergency Medicine

## 2018-06-12 ENCOUNTER — Encounter (HOSPITAL_COMMUNITY): Payer: Self-pay | Admitting: *Deleted

## 2018-06-12 ENCOUNTER — Other Ambulatory Visit: Payer: Self-pay

## 2018-06-12 DIAGNOSIS — F0390 Unspecified dementia without behavioral disturbance: Secondary | ICD-10-CM | POA: Insufficient documentation

## 2018-06-12 DIAGNOSIS — E039 Hypothyroidism, unspecified: Secondary | ICD-10-CM | POA: Insufficient documentation

## 2018-06-12 DIAGNOSIS — Z8673 Personal history of transient ischemic attack (TIA), and cerebral infarction without residual deficits: Secondary | ICD-10-CM | POA: Insufficient documentation

## 2018-06-12 DIAGNOSIS — I10 Essential (primary) hypertension: Secondary | ICD-10-CM | POA: Diagnosis not present

## 2018-06-12 DIAGNOSIS — Y998 Other external cause status: Secondary | ICD-10-CM | POA: Diagnosis not present

## 2018-06-12 DIAGNOSIS — Y92129 Unspecified place in nursing home as the place of occurrence of the external cause: Secondary | ICD-10-CM | POA: Diagnosis not present

## 2018-06-12 DIAGNOSIS — Z9049 Acquired absence of other specified parts of digestive tract: Secondary | ICD-10-CM | POA: Diagnosis not present

## 2018-06-12 DIAGNOSIS — S0083XA Contusion of other part of head, initial encounter: Secondary | ICD-10-CM | POA: Diagnosis not present

## 2018-06-12 DIAGNOSIS — Y9389 Activity, other specified: Secondary | ICD-10-CM | POA: Diagnosis not present

## 2018-06-12 DIAGNOSIS — M25562 Pain in left knee: Secondary | ICD-10-CM | POA: Diagnosis not present

## 2018-06-12 DIAGNOSIS — W0110XA Fall on same level from slipping, tripping and stumbling with subsequent striking against unspecified object, initial encounter: Secondary | ICD-10-CM | POA: Insufficient documentation

## 2018-06-12 DIAGNOSIS — M542 Cervicalgia: Secondary | ICD-10-CM | POA: Diagnosis not present

## 2018-06-12 DIAGNOSIS — Z7982 Long term (current) use of aspirin: Secondary | ICD-10-CM | POA: Diagnosis not present

## 2018-06-12 DIAGNOSIS — Z87891 Personal history of nicotine dependence: Secondary | ICD-10-CM | POA: Diagnosis not present

## 2018-06-12 DIAGNOSIS — Z7901 Long term (current) use of anticoagulants: Secondary | ICD-10-CM | POA: Diagnosis not present

## 2018-06-12 DIAGNOSIS — Z79899 Other long term (current) drug therapy: Secondary | ICD-10-CM | POA: Diagnosis not present

## 2018-06-12 DIAGNOSIS — W19XXXA Unspecified fall, initial encounter: Secondary | ICD-10-CM

## 2018-06-12 DIAGNOSIS — S0990XA Unspecified injury of head, initial encounter: Secondary | ICD-10-CM | POA: Diagnosis present

## 2018-06-12 DIAGNOSIS — T148XXA Other injury of unspecified body region, initial encounter: Secondary | ICD-10-CM

## 2018-06-12 DIAGNOSIS — M25561 Pain in right knee: Secondary | ICD-10-CM | POA: Insufficient documentation

## 2018-06-12 MED ORDER — ACETAMINOPHEN 500 MG PO TABS
1000.0000 mg | ORAL_TABLET | Freq: Once | ORAL | Status: AC
  Administered 2018-06-12: 1000 mg via ORAL
  Filled 2018-06-12: qty 2

## 2018-06-12 NOTE — ED Provider Notes (Signed)
[facility] EMERGENCY DEPARTMENT Provider Note   CSN: [id number] Arrival date & time: 06/12/18  2104    History   Chief Complaint No chief complaint on file.   HPI [name] is a 78 y.o. female.     HPI   78 year old female with a history of CVA, hypertension, hyperlipidemia, atrial fibrillation on Eliquis, CREST syndrome, dementia, who presents from [facility] with concern for fall.  Patient is unable to state date or location, but she does describe a coherent history of walking from the bathroom, tripping and falling hitting her knees and her head.  Reports she has pain over her forehead.  Reports that she has neck pain from the collar.  Denies numbness, weakness.  Reports that she has some knee pain.  Per report, she reports chronic knee pain.  Patient reports she fell on her knees.  Denies any other symptoms.  Reports chronic memory problems but is working on them.   Past Medical History:  Diagnosis Date  . Chest pain syndrome   . Hyperlipidemia   . Hypertension   . Hypothyroidism   . Stroke (HCC) 08/2016  . Vision abnormalities    as result of stroke    Patient Active Problem List   Diagnosis Date Noted  . 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 CVA (cerebral vascular accident) (HCC) 01/01/2017  . Hypertension 01/01/2017  . Hyperlipidemia 01/01/2017  . Persistent atrial fibrillation 01/01/2017  . Hypothyroidism 01/01/2017  . 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  . Lower extremity edema 01/01/2017    Past Surgical History:  Procedure Laterality Date  . CHOLECYSTECTOMY    . leg surgery    . TONSILLECTOMY       OB History   No obstetric history on file.      Home Medications    Prior to Admission medications   Medication Sig Start Date End Date Taking? Authorizing Provider  apixaban (ELIQUIS) 5 MG TABS tablet Take 5 mg by mouth 2 (two) times daily.    [provider]  aspirin EC 81 MG  tablet Take 81 mg by mouth daily.    [provider]  atorvastatin (LIPITOR) 40 MG tablet Take 40 mg by mouth daily.    [provider]  calcium carbonate (OS-CAL - DOSED IN MG OF ELEMENTAL CALCIUM) 1250 (500 Ca) MG tablet Take 1 tablet by mouth daily with breakfast.    [provider]  carvedilol (COREG) 12.5 MG tablet GIVE 1.5 TABS (18.75MG ) BY MOUTH TWICE DAILY 12/05/17   Dyann Kief, PA-C  ergocalciferol (VITAMIN D2) 50000 units capsule Take 50,000 Units by mouth once a week.    [provider]  ferrous sulfate 325 (65 FE) MG tablet Take 325 mg by mouth daily with breakfast.    [provider]  Levothyroxine Sodium (TIROSINT) 88 MCG CAPS Take 88 mcg by mouth daily before breakfast.    [provider]  Loperamide HCl (IMODIUM A-D PO) Take by mouth as needed.    [provider]  ramipril (ALTACE) 2.5 MG capsule Take 1 capsule (2.5 mg total) by mouth daily. 02/05/17   Dyann Kief, PA-C  vitamin B-12 (CYANOCOBALAMIN) 500 MCG tablet Take 500 mcg by mouth daily.    [provider]    Family History Family History  Family history unknown: Yes    Social History Social History   Tobacco Use  . Smoking status: Former Games developer  . Smokeless tobacco: Never Used  Substance Use Topics  . Alcohol use: No  . Drug use: No     Allergies   Sulfa antibiotics   Review of Systems Review of Systems  Unable to perform ROS: Dementia     Physical Exam Updated Vital Signs BP (!) 121/95   Pulse (!) 31   Temp 97.6 F (36.4 C) (Oral)   Resp 12   SpO2 100%   Physical Exam Vitals signs and nursing note reviewed.  Constitutional:      General: She is not in acute distress.    Appearance: She is well-developed. She is not diaphoretic.  HENT:     Head: Normocephalic.     Comments: Large right forehead hematoma Periorbital ecchymoses right  Eyes:     Conjunctiva/sclera: Conjunctivae normal.  Neck:      Musculoskeletal: Normal range of motion.  Cardiovascular:     Rate and Rhythm: Normal rate and regular rhythm.     Heart sounds: Normal heart sounds. No murmur. No friction rub. No gallop.   Pulmonary:     Effort: Pulmonary effort is normal. No respiratory distress.     Breath sounds: Normal breath sounds. No wheezing or rales.  Abdominal:     General: There is no distension.     Palpations: Abdomen is soft.     Tenderness: There is no abdominal tenderness. There is no guarding.  Musculoskeletal:     Cervical back: She exhibits tenderness.     Thoracic back: She exhibits no tenderness and no bony tenderness.     Lumbar back: She exhibits no tenderness and no bony tenderness.  Skin:    General: Skin is warm and dry.     Findings: No erythema or rash.  Neurological:     Mental Status: She is alert.     Comments: Oriented to self, "room downstairs", year 21---I can't remember      ED Treatments / Results  Labs (all labs ordered are listed, but only abnormal results are displayed) Labs Reviewed - No data to display  EKG None  Radiology Dg Knee 2 Views Left  Result Date: 06/12/2018 CLINICAL DATA:  Larey Seat landing on knees. EXAM: LEFT KNEE - 1-2 VIEW; RIGHT KNEE - 1-2 VIEW COMPARISON:  None. FINDINGS: RIGHT knee: No fracture deformity or dislocation. Severe patellofemoral and lateral compartment narrowing with lateral compartment periarticular sclerosis and mild marginal spurring. Faint intra-articular calcifications seen with CPPD. No destructive bony lesions. Moderate vascular calcifications. LEFT: No fracture deformity or dislocation. Moderate tricompartmental joint space narrowing with minimal marginal spurring. Faint intra-articular calcifications seen with CPPD. No destructive bony lesions. Moderate vascular calcifications. IMPRESSION: RIGHT knee: No acute fracture deformity or dislocation. Severe lateral and patellofemoral compartment osteoarthrosis. LEFT knee: Fracture deformity or  dislocation. Moderate tricompartmental osteoarthrosis. Electronically Signed   By: Awilda Metro M.D.   On: 06/12/2018 22:30   Dg Knee 2 Views Right  Result Date: 06/12/2018 CLINICAL DATA:  Larey Seat landing on knees. EXAM: LEFT KNEE - 1-2 VIEW; RIGHT KNEE - 1-2 VIEW COMPARISON:  None. FINDINGS: RIGHT knee: No fracture deformity or dislocation. Severe patellofemoral and lateral compartment narrowing with lateral compartment periarticular sclerosis and mild marginal spurring. Faint intra-articular calcifications seen with CPPD. No destructive bony lesions. Moderate vascular calcifications. LEFT: No fracture deformity or dislocation. Moderate tricompartmental joint space narrowing with minimal marginal spurring. Faint intra-articular calcifications seen with CPPD. No destructive bony lesions. Moderate vascular calcifications. IMPRESSION: RIGHT knee: No acute fracture deformity or dislocation. Severe lateral and patellofemoral compartment osteoarthrosis. LEFT knee:  Fracture deformity or dislocation. Moderate tricompartmental osteoarthrosis. Electronically Signed   By: Awilda Metro M.D.   On: 06/12/2018 22:30   Ct Head Wo Contrast  Result Date: 06/12/2018 CLINICAL DATA:  78 year old female status post unwitnessed fall. Bruising to the eye. On Eliquis. EXAM: CT HEAD WITHOUT CONTRAST CT CERVICAL SPINE WITHOUT CONTRAST TECHNIQUE: Multidetector CT imaging of the head and cervical spine was performed following the standard protocol without intravenous contrast. Multiplanar CT image reconstructions of the cervical spine were also generated. COMPARISON:  Modified barium swallow 09/05/2017. FINDINGS: CT HEAD FINDINGS Brain: Chronic encephalomalacia throughout the left temporal, occipital, and parietal lobe with ex vacuo enlargement of the left lateral ventricle. Superimposed patchy and confluent bilateral cerebral white matter hypodensity. No midline shift, ventriculomegaly, mass effect, evidence of mass lesion,  intracranial hemorrhage or evidence of cortically based acute infarction. Vascular: Extensive Calcified atherosclerosis at the skull base. Skull: No acute osseous abnormality identified. Sinuses/Orbits: Visualized paranasal sinuses and mastoids are well pneumatized. Other: Broad-based right forehead hematoma measures up to 13 millimeters in thickness. Underlying right frontal bone appears intact. No other acute scalp soft tissue finding. Orbits soft tissues appear negative. CT CERVICAL SPINE FINDINGS Alignment: Degenerative appearing anterolisthesis of C3 on C4, C4 on C5, C6 on C7, and C7 on T1. Degenerative appearing retrolisthesis of C5 on C6. Bilateral posterior element alignment remains normal. Skull base and vertebrae: Visualized skull base is intact. No atlanto-occipital dissociation. Osteopenia. No acute osseous abnormality identified. Soft tissues and spinal canal: No prevertebral fluid or swelling. No visible canal hematoma. Negative noncontrast neck soft tissues aside from calcified carotid atherosclerosis. Disc levels: C4-C5 ankylosis. Severe facet degeneration elsewhere. Advanced disc degeneration except at C2-C3. Up to mild degenerative spinal stenosis. Upper chest: Mild degenerative appearing anterolisthesis also in the upper thoracic spine. Visible upper thoracic levels appear grossly intact. Negative lung apices. Mildly dilated and fluid-filled proximal esophagus (series 9, image 77). IMPRESSION: 1. Right forehead hematoma without underlying skull fracture. 2. No acute intracranial abnormality. Large chronic left PCA territory infarct with ex vacuo enlargement of the left lateral ventricle. 3.  No acute traumatic injury identified in the cervical spine. 4. 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 5. Fluid-filled and mildly dilated upper thoracic esophagus. Esophageal dysmotility was also suspected on the 2019 comparison. Electronically Signed   By: Odessa Fleming M.D.   On:  06/12/2018 22:43   Ct Cervical Spine Wo Contrast  Result Date: 06/12/2018 CLINICAL DATA:  78 year old female status post unwitnessed fall. Bruising to the eye. On Eliquis. EXAM: CT HEAD WITHOUT CONTRAST CT CERVICAL SPINE WITHOUT CONTRAST TECHNIQUE: Multidetector CT imaging of the head and cervical spine was performed following the standard protocol without intravenous contrast. Multiplanar CT image reconstructions of the cervical spine were also generated. COMPARISON:  Modified barium swallow 09/05/2017. FINDINGS: CT HEAD FINDINGS Brain: Chronic encephalomalacia throughout the left temporal, occipital, and parietal lobe with ex vacuo enlargement of the left lateral ventricle. Superimposed patchy and confluent bilateral cerebral white matter hypodensity. No midline shift, ventriculomegaly, mass effect, evidence of mass lesion, intracranial hemorrhage or evidence of cortically based acute infarction. Vascular: Extensive Calcified atherosclerosis at the skull base. Skull: No acute osseous abnormality identified. Sinuses/Orbits: Visualized paranasal sinuses and mastoids are well pneumatized. Other: Broad-based right forehead hematoma measures up to 13 millimeters in thickness. Underlying right frontal bone appears intact. No other acute scalp soft tissue finding. Orbits soft tissues appear negative. CT CERVICAL SPINE FINDINGS Alignment: Degenerative appearing anterolisthesis of C3 on C4, C4 on C5, C6  on C7, and C7 on T1. Degenerative appearing retrolisthesis of C5 on C6. Bilateral posterior element alignment remains normal. Skull base and vertebrae: Visualized skull base is intact. No atlanto-occipital dissociation. Osteopenia. No acute osseous abnormality identified. Soft tissues and spinal canal: No prevertebral fluid or swelling. No visible canal hematoma. Negative noncontrast neck soft tissues aside from calcified carotid atherosclerosis. Disc levels: C4-C5 ankylosis. Severe facet degeneration elsewhere.  Advanced disc degeneration except at C2-C3. Up to mild degenerative spinal stenosis. Upper chest: Mild degenerative appearing anterolisthesis also in the upper thoracic spine. Visible upper thoracic levels appear grossly intact. Negative lung apices. Mildly dilated and fluid-filled proximal esophagus (series 9, image 77). IMPRESSION: 1. Right forehead hematoma without underlying skull fracture. 2. No acute intracranial abnormality. Large chronic left PCA territory infarct with ex vacuo enlargement of the left lateral ventricle. 3.  No acute traumatic injury identified in the cervical spine. 4. 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 5. Fluid-filled and mildly dilated upper thoracic esophagus. Esophageal dysmotility was also suspected on the 2019 comparison. Electronically Signed   By: Odessa FlemingH  Hall M.D.   On: 06/12/2018 22:43    Procedures Procedures (including critical care time)  Medications Ordered in ED Medications  acetaminophen (TYLENOL) tablet 1,000 mg (1,000 mg Oral Given 06/12/18 2304)     Initial Impression / Assessment and Plan / ED Course  I have reviewed the triage vital signs and the nursing notes.  Pertinent labs & imaging results that were available during my care of the patient were reviewed by me and considered in my medical decision making (see chart for details).        78 year old female with a history of CVA, hypertension, hyperlipidemia, atrial fibrillation on Eliquis, CREST syndrome, dementia, who [facility] with concern for fall.  CT head and cervical spine done which show no sign of intracranial bleed or fracture.  X-rays of bilateral knees show no sign of fracture.  She denies any other injuries by history and physical exam.  She is at baseline per report, and has no other concerns.  Recommend Tylenol, ice for scalp hematoma, and primary care physician follow-up.  Final Clinical Impressions(s) / ED Diagnoses   Final  diagnoses:  Fall, initial encounter  Hematoma    ED Discharge Orders    None       Alvira MondaySchlossman, Jaire Pinkham, MD 06/13/18 [phone]

## 2018-06-12 NOTE — ED Triage Notes (Signed)
Pt arrives via [facility] from [facility] in [place]. Per EMS report, she was walking to her room and fell (unwitnessed) forward, hit her head over the right eye. Hematoma and bruising to the eye. No neck or back pain. She is on Eliquis. Hx of MI, stroke, dementia (per nursing facility staff pt is at baseline, oriented to self only). Also c/o bilateral knee pain (caretakers report pt is always c/o knee pain). Ccollar in place. En route, 124/52, hr 50, 98% RA, CBG 157.

## 2018-06-12 NOTE — ED Notes (Signed)
Ptar called for pt 

## 2018-06-12 NOTE — ED Notes (Signed)
Patient ambulated to bathroom with assistance.

## 2019-12-09 ENCOUNTER — Other Ambulatory Visit: Payer: Self-pay

## 2019-12-09 ENCOUNTER — Encounter (HOSPITAL_COMMUNITY): Payer: Self-pay

## 2019-12-09 ENCOUNTER — Emergency Department (HOSPITAL_COMMUNITY): Payer: Medicare Other

## 2019-12-09 ENCOUNTER — Emergency Department (HOSPITAL_COMMUNITY)
Admission: EM | Admit: 2019-12-09 | Discharge: 2019-12-09 | Disposition: A | Discharge status: Home / Self Care | Payer: Medicare Other | Attending: Emergency Medicine | Admitting: Emergency Medicine

## 2019-12-09 DIAGNOSIS — S0181XA Laceration without foreign body of other part of head, initial encounter: Secondary | ICD-10-CM | POA: Diagnosis present

## 2019-12-09 DIAGNOSIS — M25511 Pain in right shoulder: Secondary | ICD-10-CM | POA: Insufficient documentation

## 2019-12-09 DIAGNOSIS — W010XXA Fall on same level from slipping, tripping and stumbling without subsequent striking against object, initial encounter: Secondary | ICD-10-CM | POA: Diagnosis not present

## 2019-12-09 DIAGNOSIS — W19XXXA Unspecified fall, initial encounter: Secondary | ICD-10-CM

## 2019-12-09 DIAGNOSIS — Z87891 Personal history of nicotine dependence: Secondary | ICD-10-CM | POA: Diagnosis not present

## 2019-12-09 DIAGNOSIS — Y999 Unspecified external cause status: Secondary | ICD-10-CM | POA: Insufficient documentation

## 2019-12-09 DIAGNOSIS — I639 Cerebral infarction, unspecified: Secondary | ICD-10-CM | POA: Diagnosis not present

## 2019-12-09 DIAGNOSIS — Z7901 Long term (current) use of anticoagulants: Secondary | ICD-10-CM | POA: Diagnosis not present

## 2019-12-09 DIAGNOSIS — M341 CR(E)ST syndrome: Secondary | ICD-10-CM | POA: Insufficient documentation

## 2019-12-09 DIAGNOSIS — Y9289 Other specified places as the place of occurrence of the external cause: Secondary | ICD-10-CM | POA: Insufficient documentation

## 2019-12-09 DIAGNOSIS — Z7982 Long term (current) use of aspirin: Secondary | ICD-10-CM | POA: Insufficient documentation

## 2019-12-09 DIAGNOSIS — E785 Hyperlipidemia, unspecified: Secondary | ICD-10-CM | POA: Insufficient documentation

## 2019-12-09 DIAGNOSIS — I10 Essential (primary) hypertension: Secondary | ICD-10-CM | POA: Diagnosis not present

## 2019-12-09 DIAGNOSIS — Y939 Activity, unspecified: Secondary | ICD-10-CM | POA: Insufficient documentation

## 2019-12-09 DIAGNOSIS — E039 Hypothyroidism, unspecified: Secondary | ICD-10-CM | POA: Diagnosis not present

## 2019-12-09 DIAGNOSIS — I4811 Longstanding persistent atrial fibrillation: Secondary | ICD-10-CM | POA: Diagnosis not present

## 2019-12-09 LAB — COMPREHENSIVE METABOLIC PANEL
ALT: 9 U/L (ref 0–44)
AST: 16 U/L (ref 15–41)
Albumin: 3.5 g/dL (ref 3.5–5.0)
Alkaline Phosphatase: 55 U/L (ref 38–126)
Anion gap: 8 (ref 5–15)
BUN: 16 mg/dL (ref 8–23)
CO2: 25 mmol/L (ref 22–32)
Calcium: 9.1 mg/dL (ref 8.9–10.3)
Chloride: 105 mmol/L (ref 98–111)
Creatinine, Ser: 1.01 mg/dL — ABNORMAL HIGH (ref 0.44–1.00)
GFR calc Af Amer: >60 mL/min (ref >60)
GFR calc non Af Amer: 53 mL/min — ABNORMAL LOW (ref >60)
Glucose, Bld: 101 mg/dL — ABNORMAL HIGH (ref 70–99)
Potassium: 4.3 mmol/L (ref 3.5–5.1)
Sodium: 138 mmol/L (ref 135–145)
Total Bilirubin: 0.7 mg/dL (ref 0.3–1.2)
Total Protein: 5.9 g/dL — ABNORMAL LOW (ref 6.5–8.1)

## 2019-12-09 LAB — CBC WITH DIFFERENTIAL/PLATELET
Abs Immature Granulocytes: 0.02 10*3/uL (ref 0.00–0.07)
Basophils Absolute: 0 10*3/uL (ref 0.0–0.1)
Basophils Relative: 1 %
Eosinophils Absolute: 0.1 10*3/uL (ref 0.0–0.5)
Eosinophils Relative: 1 %
HCT: 31 % — ABNORMAL LOW (ref 36.0–46.0)
Hemoglobin: 9.4 g/dL — ABNORMAL LOW (ref 12.0–15.0)
Immature Granulocytes: 0 %
Lymphocytes Relative: 27 %
Lymphs Abs: 1.6 10*3/uL (ref 0.7–4.0)
MCH: 27.7 pg (ref 26.0–34.0)
MCHC: 30.3 g/dL (ref 30.0–36.0)
MCV: 91.4 fL (ref 80.0–100.0)
Monocytes Absolute: 0.7 10*3/uL (ref 0.1–1.0)
Monocytes Relative: 12 %
Neutro Abs: 3.7 10*3/uL (ref 1.7–7.7)
Neutrophils Relative %: 59 %
Platelets: 244 10*3/uL (ref 150–400)
RBC: 3.39 MIL/uL — ABNORMAL LOW (ref 3.87–5.11)
RDW: 17 % — ABNORMAL HIGH (ref 11.5–15.5)
WBC: 6.2 10*3/uL (ref 4.0–10.5)
nRBC: 0 % (ref 0.0–0.2)

## 2019-12-09 NOTE — ED Triage Notes (Addendum)
Pt arrived via EMS from [place] [place]. Pt states she fell on her face in her room landing on her chin. Laceration noted to chin. Pt denies LOC, pt is on Eliquis. Pt states she has some discomfort in her right shoulder after the fall. Pt is a/o x2. Pt states she walks with a cane.

## 2019-12-09 NOTE — ED Notes (Signed)
RN gave pt [place] sandwich bag and gingerale.

## 2019-12-09 NOTE — Discharge Instructions (Addendum)
Get help right away if: Your wound reopens and is draining. Your wound becomes red, swollen, hot, or tender. You develop a rash after the glue is applied. You have increasing pain in the wound. You have a red streak going away from the wound. You have pus coming from the wound. You have increased bleeding. You have a fever. You have shaking chills. You notice a bad smell coming from the wound. Your wound or the adhesive breaks open. 

## 2019-12-09 NOTE — ED Provider Notes (Signed)
[facility] EMERGENCY DEPARTMENT Provider Note   CSN: [id number] Arrival date & time: 12/09/19  1205     History Chief Complaint  Patient presents with  . Fall  . Laceration     chin    [name] is a 79 y.o. 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  Patient states that she tripped this morning and hit her chin on the floor.  She is a little bit of pain in her right shoulder but is able to move it.  She denies losing consciousness.  She denies any pain in her teeth.  She did not know that she was taking Eliquis.  She has no other complaints at this time. HPI     Past Medical History:  Diagnosis Date  . Chest pain syndrome   . Hyperlipidemia   . Hypertension   . Hypothyroidism   . Stroke (HCC) 08/2016  . Vision abnormalities    as result of stroke    Patient Active Problem List   Diagnosis Date Noted  . 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 CVA (cerebral vascular accident) (HCC) 01/01/2017  . Hypertension 01/01/2017  . Hyperlipidemia 01/01/2017  . Persistent atrial fibrillation (HCC) 01/01/2017  . Hypothyroidism 01/01/2017  . 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  . Lower extremity edema 01/01/2017    Past Surgical History:  Procedure Laterality Date  . CHOLECYSTECTOMY    . leg surgery    . TONSILLECTOMY       OB History   No obstetric history on file.     Family History  Family history unknown: Yes    Social History   Tobacco Use  . Smoking status: Former Games developer  . Smokeless tobacco: Never Used  Substance Use Topics  . Alcohol use: No  . Drug use: No    Home Medications Prior to Admission medications   Medication Sig Start Date End Date Taking? Authorizing Provider  apixaban (ELIQUIS) 5 MG TABS  tablet Take 5 mg by mouth 2 (two) times daily.    [provider]  aspirin EC 81 MG tablet Take 81 mg by mouth daily.    [provider]  atorvastatin (LIPITOR) 40 MG tablet Take 40 mg by mouth daily.    [provider]  calcium carbonate (OS-CAL - DOSED IN MG OF ELEMENTAL CALCIUM) 1250 (500 Ca) MG tablet Take 1 tablet by mouth daily with breakfast.    [provider]  carvedilol (COREG) 12.5 MG tablet GIVE 1.5 TABS (18.75MG ) BY MOUTH TWICE DAILY 12/05/17   Dyann Kief, PA-C  ergocalciferol (VITAMIN D2) 50000 units capsule Take 50,000 Units by mouth once a week.    [provider]  ferrous sulfate 325 (65 FE) MG tablet Take 325 mg by mouth daily with breakfast.    [provider]  Levothyroxine Sodium (TIROSINT) 88 MCG CAPS Take 88 mcg by mouth daily before breakfast.    [provider]  Loperamide HCl (IMODIUM A-D PO) Take by mouth as needed.    [provider]  ramipril (ALTACE) 2.5 MG capsule Take 1 capsule (2.5 mg total) by mouth daily. 02/05/17   Dyann Kief, PA-C  vitamin B-12 (CYANOCOBALAMIN) 500 MCG tablet Take 500 mcg by mouth daily.    [provider]    Allergies    Sulfa antibiotics  Review of Systems   Review of Systems Ten systems reviewed and are negative for acute change, except as noted in the HPI.   Physical Exam Updated Vital Signs BP 138/85 (BP Location: Right Arm)   Pulse 76   Temp 98.6 F (37 C) (Oral)   Resp 18   Ht 5\' 6"  (1.676 m)   Wt 52.6 kg   SpO2 95%   BMI 18.72 kg/m   Physical Exam Vitals and nursing note reviewed.  Constitutional:      General: She is not in acute distress.    Appearance: She is well-developed. She is not diaphoretic.  HENT:     Head: Normocephalic.     Comments: 2 cm submental laceration Eyes:     General: No scleral icterus.    Conjunctiva/sclera: Conjunctivae normal.  Cardiovascular:     Rate and Rhythm: Normal rate and regular  rhythm.     Heart sounds: Normal heart sounds. No murmur heard.  No friction rub. No gallop.   Pulmonary:     Effort: Pulmonary effort is normal. No respiratory distress.     Breath sounds: Normal breath sounds.  Abdominal:     General: Bowel sounds are normal. There is no distension.     Palpations: Abdomen is soft. There is no mass.     Tenderness: There is no abdominal tenderness. There is no guarding.  Musculoskeletal:     Cervical back: Normal range of motion.     Comments: Multiple missing digits on both hands.  Skin:    General: Skin is warm and dry.  Neurological:     Mental Status: She is alert and oriented to person, place, and time.  Psychiatric:        Behavior: Behavior normal.     ED Results / Procedures / Treatments   Labs (all labs ordered are listed, but only abnormal results are displayed) Labs Reviewed  CBC WITH DIFFERENTIAL/PLATELET - Abnormal; Notable for the following components:      Result Value   RBC 3.39 (*)    Hemoglobin 9.4 (*)    HCT 31.0 (*)    RDW 17.0 (*)    All other components within normal limits  COMPREHENSIVE METABOLIC PANEL - Abnormal; Notable for the following components:   Glucose, Bld 101 (*)    Creatinine, Ser 1.01 (*)    Total Protein 5.9 (*)    GFR calc non Af Amer 53 (*)    All other components within normal limits    EKG None  Radiology CT HEAD WO CONTRAST  Result Date: 12/09/2019 CLINICAL DATA:  Facial trauma. Additional provided: Fall, neck pain. EXAM: CT HEAD WITHOUT CONTRAST CT CERVICAL SPINE WITHOUT CONTRAST TECHNIQUE: Multidetector CT imaging of the head and cervical spine was performed following the standard protocol without intravenous contrast. Multiplanar CT image reconstructions of the cervical spine were also generated. COMPARISON:  CT head/cervical spine 06/12/2018 FINDINGS: CT HEAD FINDINGS Brain: Redemonstrated chronic encephalomalacia within portions of the left temporal, parietal and occipital lobes. Ex  vacuo dilatation of the left lateral ventricle. Stable background mild generalized parenchymal atrophy and moderate chronic small vessel ischemic disease. There is no acute intracranial hemorrhage. No acute demarcated cortical infarct is identified. No extra-axial fluid collection. No evidence of intracranial mass. No midline shift. Vascular: No hyperdense vessel.  Atherosclerotic calcifications Skull: Normal. Negative for fracture or focal lesion. Sinuses/Orbits: Visualized orbits show no acute finding. Mild ethmoid sinus mucosal thickening. No significant mastoid effusion. CT CERVICAL SPINE FINDINGS Alignment: As  before, there is 2 mm C2-C3 grade 1 anterolisthesis, 4 mm C3-C4 grade 1 anterolisthesis. 2 mm C4-C5 grade 1 anterolisthesis and 2 mm C5-C6 grade 1 anterolisthesis. Trace grade 1 anterolisthesis also present at C6-C7, C7-T1, T1-T2 and T2-T3. Skull base and vertebrae: The basion-dental and atlanto-dental intervals are maintained.No evidence of acute fracture to the cervical spine. Soft tissues and spinal canal: No prevertebral fluid or swelling. No visible canal hematoma. Disc levels: Unchanged cervical spondylosis. This includes advanced disc space narrowing at the C3-C4 through C6-C7 levels. Redemonstrated C4-C5 ankylosis. Multilevel uncovertebral and facet hypertrophy. Upper chest: No consolidation within the imaged lung apices. No visible pneumothorax. IMPRESSION: CT head: 1. No evidence of acute intracranial abnormality. 2. Redemonstrated large chronic infarct within portions of the left temporal, parietal and occipital lobes. 3. Stable background mild generalized parenchymal atrophy and moderate chronic small vessel ischemic disease. 4. Mild ethmoid sinus mucosal thickening. CT cervical spine: 1. No evidence of acute fracture to the cervical spine. 2. Unchanged multilevel grade 1 spondylolisthesis. Most notably, there is 4 mm grade 1 anterolisthesis at C3-C4. 3. Unchanged advanced cervical spondylosis  as described. Electronically Signed   By: Jackey LogeKyle  Golden DO   On: 12/09/2019 13:42   CT CERVICAL SPINE WO CONTRAST  Result Date: 12/09/2019 CLINICAL DATA:  Facial trauma. Additional provided: Fall, neck pain. EXAM: CT HEAD WITHOUT CONTRAST CT CERVICAL SPINE WITHOUT CONTRAST TECHNIQUE: Multidetector CT imaging of the head and cervical spine was performed following the standard protocol without intravenous contrast. Multiplanar CT image reconstructions of the cervical spine were also generated. COMPARISON:  CT head/cervical spine 06/12/2018 FINDINGS: CT HEAD FINDINGS Brain: Redemonstrated chronic encephalomalacia within portions of the left temporal, parietal and occipital lobes. Ex vacuo dilatation of the left lateral ventricle. Stable background mild generalized parenchymal atrophy and moderate chronic small vessel ischemic disease. There is no acute intracranial hemorrhage. No acute demarcated cortical infarct is identified. No extra-axial fluid collection. No evidence of intracranial mass. No midline shift. Vascular: No hyperdense vessel.  Atherosclerotic calcifications Skull: Normal. Negative for fracture or focal lesion. Sinuses/Orbits: Visualized orbits show no acute finding. Mild ethmoid sinus mucosal thickening. No significant mastoid effusion. CT CERVICAL SPINE FINDINGS Alignment: As before, there is 2 mm C2-C3 grade 1 anterolisthesis, 4 mm C3-C4 grade 1 anterolisthesis. 2 mm C4-C5 grade 1 anterolisthesis and 2 mm C5-C6 grade 1 anterolisthesis. Trace grade 1 anterolisthesis also present at C6-C7, C7-T1, T1-T2 and T2-T3. Skull base and vertebrae: The basion-dental and atlanto-dental intervals are maintained.No evidence of acute fracture to the cervical spine. Soft tissues and spinal canal: No prevertebral fluid or swelling. No visible canal hematoma. Disc levels: Unchanged cervical spondylosis. This includes advanced disc space narrowing at the C3-C4 through C6-C7 levels. Redemonstrated C4-C5 ankylosis.  Multilevel uncovertebral and facet hypertrophy. Upper chest: No consolidation within the imaged lung apices. No visible pneumothorax. IMPRESSION: CT head: 1. No evidence of acute intracranial abnormality. 2. Redemonstrated large chronic infarct within portions of the left temporal, parietal and occipital lobes. 3. Stable background mild generalized parenchymal atrophy and moderate chronic small vessel ischemic disease. 4. Mild ethmoid sinus mucosal thickening. CT cervical spine: 1. No evidence of acute fracture to the cervical spine. 2. Unchanged multilevel grade 1 spondylolisthesis. Most notably, there is 4 mm grade 1 anterolisthesis at C3-C4. 3. Unchanged advanced cervical spondylosis as described. Electronically Signed   By: Jackey LogeKyle  Golden DO   On: 12/09/2019 13:42   DG Pelvis Portable  Result Date: 12/09/2019 CLINICAL DATA:  Fall, pelvic pain EXAM: PORTABLE PELVIS  1-2 VIEWS COMPARISON:  None. FINDINGS: Moderately displaced right inferior pubic ramus fracture. Nondisplaced right superior pubic ramus fracture. SI joints and pubic symphysis appear intact without diastasis. Bilateral hip joints are intact without evidence of fracture or dislocation. Prominent vascular calcifications. IMPRESSION: Moderately displaced right inferior pubic ramus fracture. Nondisplaced right superior pubic ramus fracture. Electronically Signed   By: [name] D.O.   On: 12/09/2019 13:25   DG Chest Port 1 View  Result Date: 12/09/2019 CLINICAL DATA:  Chest pain after fall EXAM: PORTABLE CHEST 1 VIEW COMPARISON:  None. FINDINGS: Mild cardiomegaly. Atherosclerotic calcification of the aortic knob. Hyperexpanded lungs. No focal airspace consolidation, pleural effusion, or pneumothorax. No acute osseous findings. IMPRESSION: 1. No acute cardiopulmonary findings. 2. Mild cardiomegaly. 3. COPD. Electronically Signed   By: [name] D.O.   On: 12/09/2019 13:23   DG Shoulder Right Port  Result Date: 12/09/2019 CLINICAL  DATA:  Right shoulder pain after fall EXAM: PORTABLE RIGHT SHOULDER COMPARISON:  None. FINDINGS: No acute fracture or dislocation. Humeral head is slightly high-riding relative to the glenoid suggesting underlying rotator cuff pathology. Mild arthropathy of the glenohumeral and acromioclavicular joints. No focal soft tissue abnormality. IMPRESSION: 1. No acute fracture or dislocation of the right shoulder. 2. Mild degenerative changes of the right shoulder with findings suggestive of underlying rotator cuff pathology. Electronically Signed   By: [name] D.O.   On: 12/09/2019 13:27    Procedures .Marland KitchenLaceration Repair  Date/Time: 12/09/2019 10:03 PM Performed by: Arthor Captain, PA-C Authorized by: Arthor Captain, PA-C   Consent:    Consent obtained:  Verbal   Consent given by:  Patient   Risks discussed:  Infection, need for additional repair, pain, poor cosmetic result and poor wound healing   Alternatives discussed:  No treatment and delayed treatment Universal protocol:    Procedure explained and questions answered to patient or proxy's satisfaction: yes     Relevant documents present and verified: yes     Test results available and properly labeled: yes     Imaging studies available: yes     Required blood products, implants, devices, and special equipment available: yes     Site/side marked: yes     Immediately prior to procedure, a time out was called: yes     Patient identity confirmed:  Verbally with patient Laceration details:    Location:  Face   Face location:  Chin   Length (cm):  2 Repair type:    Repair type:  Simple Exploration:    Wound exploration: wound explored through full range of motion   Treatment:    Area cleansed with:  Betadine   Amount of cleaning:  Standard   Irrigation solution:  Sterile saline Skin repair:    Repair method:  Tissue adhesive Approximation:    Approximation:  Close Post-procedure details:    Dressing:  Open (no dressing)    Patient tolerance of procedure:  Tolerated well, no immediate complications   (including critical care time)  Medications Ordered in ED Medications - No data to display  ED Course  I have reviewed the triage vital signs and the nursing notes.  Pertinent labs & imaging results that were available during my care of the patient were reviewed by me and considered in my medical decision making (see chart for details).  Clinical Course as of Dec 09 2202  Wed Dec 09, 2019  1354 X-ray shows pelvic fracture.  Case discussed with Dr. Ferdie Ping states that he cannot tell  if it is old or new.  I evaluated the patient.  I was able to actively and passively range the hip without any pelvic pain, no pain with pressure to the pelvis and palpation of the bones.  DG Pelvis Portable [AH]    Clinical Course User Index [AH] Arthor Captain, PA-C   MDM Rules/Calculators/A&P                         79 year old female here with mechanical fall and chin laceration.  Patient on Eliquis for permanent A. fib and called as a level 2 trauma. I ordered interpreted and reviewed patient's labs.  CMP and CBC without significant abnormality.  Patient does have low hemoglobin and I do not have prior for comparison.  I ordered interpreted and reviewed radiologic images including chest, pelvis, shoulder plain films and CT head and C-spine.  Questionable pelvic fracture new versus old.  Patient does not remember fracturing her pelvis but is a very poor historian.  She has no pain with passive or active range of motion of the hips, palpation of the pelvis and is ambulatory with a cane and I have low suspicion that this is an acute fracture.  The remainder of the patient's images are without significant abnormality.  I repaired the laceration with Dermabond.  Patient appears otherwise appropriate to return to her skilled nursing facility. Final Clinical Impression(s) / ED Diagnoses Final diagnoses:  Fall  Facial laceration, initial  encounter  Fall, initial encounter    Rx / DC Orders ED Discharge Orders    None       Arthor Captain, PA-C 12/09/19 2207    Pricilla Loveless, MD 12/10/19 [phone]

## 2019-12-09 NOTE — Progress Notes (Signed)
Orthopedic Tech Progress Note Patient Details:  [name] 03/08/1941 [id number] Level 2 trauma Patient ID: [name], female   DOB: [birth year], 79 y.o.   MRN: [id number]   [name] 12/09/2019, 1:22 PM

## 2020-06-20 ENCOUNTER — Encounter: Payer: Self-pay | Admitting: Podiatry

## 2020-06-20 ENCOUNTER — Other Ambulatory Visit: Payer: Medicare Other

## 2020-06-20 ENCOUNTER — Other Ambulatory Visit: Payer: Self-pay

## 2020-06-20 ENCOUNTER — Ambulatory Visit (INDEPENDENT_AMBULATORY_CARE_PROVIDER_SITE_OTHER): Payer: Medicare Other | Admitting: Podiatry

## 2020-06-20 ENCOUNTER — Ambulatory Visit (INDEPENDENT_AMBULATORY_CARE_PROVIDER_SITE_OTHER): Payer: Medicare Other

## 2020-06-20 DIAGNOSIS — M19072 Primary osteoarthritis, left ankle and foot: Secondary | ICD-10-CM | POA: Diagnosis not present

## 2020-06-20 DIAGNOSIS — L84 Corns and callosities: Secondary | ICD-10-CM

## 2020-06-20 DIAGNOSIS — M2142 Flat foot [pes planus] (acquired), left foot: Secondary | ICD-10-CM

## 2020-06-20 DIAGNOSIS — M79672 Pain in left foot: Secondary | ICD-10-CM | POA: Diagnosis not present

## 2020-06-20 DIAGNOSIS — M76821 Posterior tibial tendinitis, right leg: Secondary | ICD-10-CM

## 2020-06-20 DIAGNOSIS — M2042 Other hammer toe(s) (acquired), left foot: Secondary | ICD-10-CM

## 2020-06-20 DIAGNOSIS — M2141 Flat foot [pes planus] (acquired), right foot: Secondary | ICD-10-CM

## 2020-06-20 DIAGNOSIS — M79671 Pain in right foot: Secondary | ICD-10-CM | POA: Diagnosis not present

## 2020-06-20 DIAGNOSIS — M76822 Posterior tibial tendinitis, left leg: Secondary | ICD-10-CM

## 2020-06-20 NOTE — Progress Notes (Signed)
°  Subjective:  Patient ID: [name], female    DOB: May 09, 1940,  MRN: [id number]  Chief Complaint  Patient presents with   Foot Pain    Bilateral foot pain. Left foot is worse than right. She is having more pain in the left foot. They are wanting to figure out a way to get custom shoes.    80 y.o. female presents with the above complaint. History confirmed with patient.  She is here with her sisters that live in [place].  She has some memory issues secondary to previous stroke.  She had foot surgery on her left foot many years ago when she lived in [place].  Does not recall who the surgeon was or why she had it.  Her primary complaint is a large plantar callus on the plantar left foot.  There should not custom shoes or bracing to repeat this  Objective:  Physical Exam: warm, good capillary refill, no trophic changes or ulcerative lesions, normal DP and PT pulses and normal sensory exam. Venous insufficiency bilaterally with varicose veins.  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  Well-healed surgical incision of the left foot along the posterior tibial tendon and dorsal midfoot.  She has large plantar bony prominence with callus formation which is painful to touch.  Radiographs: X-ray of both feet: Severe pes planus with left worse than right, there is collapse of the arch and previous attempted talonavicular arthrodesis with a broken screw, 2 culture anchors, one still navicular wound in the dorsal soft tissues, seemingly from previous flexor digitorum longus transfer.  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 Assessment:   1. Pain in both feet   2. Hammertoe of left foot   3. Posterior tibial tendon dysfunction (PTTD) of both lower extremities   4. Arthritis of left foot   5. Pes planus of both feet   6. Callus of foot      Plan:   Patient was evaluated and treated and all questions answered.  I discussed with the patient and her family in detail the etiology of the painful plantar deformity and callus that she has.  We also reviewed her x-rays in detail and discussed what I thought she had from her previous foot surgery.  Assessment she does have severe end-stage hindfoot and midfoot arthritis, the only definitive treatment was be significant multiple metatarsal and hindfoot fusions, and her age and with her previous cardiac and stroke history do not think this would be a good idea.  We also discussed a supportive option as such as a AFO brace, she does not have significant subtalar joint or ankle pain.  I think her chief complaint of the painful plantar prominence and the callus would best be served with an offloading accommodative multidensity insert.  I wrote him a prescription to have this completed angle with a custom orthopedic shoe.  I would agree to the callus to a level of tolerance, discussed with him that this is something we could do but is likely not covered by Medicare she does not have qualifying medical risk factors, if this is something duration we could continue to do this but would likely have to pay out-of-pocket.  I also recommended urea cream which they purchased today for the callus.  Return in about 3 months (around 09/17/2020) for callus.

## 2020-06-21 ENCOUNTER — Other Ambulatory Visit: Payer: Self-pay | Admitting: Podiatry

## 2020-06-21 DIAGNOSIS — M2141 Flat foot [pes planus] (acquired), right foot: Secondary | ICD-10-CM

## 2020-09-20 ENCOUNTER — Ambulatory Visit: Payer: Medicare Other | Admitting: Podiatry

## 2020-12-13 DIAGNOSIS — E559 Vitamin D deficiency, unspecified: Secondary | ICD-10-CM | POA: Insufficient documentation

## 2021-02-10 ENCOUNTER — Telehealth (HOSPITAL_COMMUNITY): Payer: Self-pay | Admitting: *Deleted

## 2021-02-10 NOTE — Telephone Encounter (Signed)
Attempted to contact transportation about scheduling OP MBS for patient. Left VM. RKEEL

## 2021-02-18 ENCOUNTER — Other Ambulatory Visit: Payer: Self-pay

## 2021-02-18 ENCOUNTER — Emergency Department (HOSPITAL_COMMUNITY)
Admission: EM | Admit: 2021-02-18 | Discharge: 2021-02-19 | Disposition: A | Discharge status: Home / Self Care | Payer: Medicare Other | Attending: Emergency Medicine | Admitting: Emergency Medicine

## 2021-02-18 ENCOUNTER — Encounter (HOSPITAL_COMMUNITY): Payer: Self-pay | Admitting: Emergency Medicine

## 2021-02-18 DIAGNOSIS — I10 Essential (primary) hypertension: Secondary | ICD-10-CM | POA: Diagnosis not present

## 2021-02-18 DIAGNOSIS — Z79899 Other long term (current) drug therapy: Secondary | ICD-10-CM | POA: Insufficient documentation

## 2021-02-18 DIAGNOSIS — M79605 Pain in left leg: Secondary | ICD-10-CM | POA: Diagnosis present

## 2021-02-18 DIAGNOSIS — L03116 Cellulitis of left lower limb: Secondary | ICD-10-CM | POA: Insufficient documentation

## 2021-02-18 DIAGNOSIS — I48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 atrial fibrillation: Secondary | ICD-10-CM | POA: Insufficient documentation

## 2021-02-18 DIAGNOSIS — Z7901 Long term (current) use of anticoagulants: Secondary | ICD-10-CM | POA: Diagnosis not present

## 2021-02-18 DIAGNOSIS — Z87891 Personal history of nicotine dependence: Secondary | ICD-10-CM | POA: Diagnosis not present

## 2021-02-18 DIAGNOSIS — Z7982 Long term (current) use of aspirin: Secondary | ICD-10-CM | POA: Diagnosis not present

## 2021-02-18 DIAGNOSIS — E039 Hypothyroidism, unspecified: Secondary | ICD-10-CM | POA: Diagnosis not present

## 2021-02-18 DIAGNOSIS — M7122 Synovial cyst of popliteal space [Baker], left knee: Secondary | ICD-10-CM | POA: Insufficient documentation

## 2021-02-18 LAB — CBC WITH DIFFERENTIAL/PLATELET
Abs Immature Granulocytes: 0.05 10*3/uL (ref 0.00–0.07)
Basophils Absolute: 0 10*3/uL (ref 0.0–0.1)
Basophils Relative: 0 %
Eosinophils Absolute: 0.1 10*3/uL (ref 0.0–0.5)
Eosinophils Relative: 1 %
HCT: 34.7 % — ABNORMAL LOW (ref 36.0–46.0)
Hemoglobin: 11.1 g/dL — ABNORMAL LOW (ref 12.0–15.0)
Immature Granulocytes: 1 %
Lymphocytes Relative: 13 %
Lymphs Abs: 1.2 10*3/uL (ref 0.7–4.0)
MCH: 28 pg (ref 26.0–34.0)
MCHC: 32 g/dL (ref 30.0–36.0)
MCV: 87.6 fL (ref 80.0–100.0)
Monocytes Absolute: 1 10*3/uL (ref 0.1–1.0)
Monocytes Relative: 10 %
Neutro Abs: 7.1 10*3/uL (ref 1.7–7.7)
Neutrophils Relative %: 75 %
Platelets: 245 10*3/uL (ref 150–400)
RBC: 3.96 MIL/uL (ref 3.87–5.11)
RDW: 14.6 % (ref 11.5–15.5)
WBC: 9.4 10*3/uL (ref 4.0–10.5)
nRBC: 0 % (ref 0.0–0.2)

## 2021-02-18 LAB — COMPREHENSIVE METABOLIC PANEL
ALT: 10 U/L (ref 0–44)
AST: 15 U/L (ref 15–41)
Albumin: 3.4 g/dL — ABNORMAL LOW (ref 3.5–5.0)
Alkaline Phosphatase: 72 U/L (ref 38–126)
Anion gap: 8 (ref 5–15)
BUN: 14 mg/dL (ref 8–23)
CO2: 27 mmol/L (ref 22–32)
Calcium: 8.6 mg/dL — ABNORMAL LOW (ref 8.9–10.3)
Chloride: 98 mmol/L (ref 98–111)
Creatinine, Ser: 0.73 mg/dL (ref 0.44–1.00)
GFR, Estimated: >60 mL/min (ref >60)
Glucose, Bld: 114 mg/dL — ABNORMAL HIGH (ref 70–99)
Potassium: 3.7 mmol/L (ref 3.5–5.1)
Sodium: 133 mmol/L — ABNORMAL LOW (ref 135–145)
Total Bilirubin: 0.5 mg/dL (ref 0.3–1.2)
Total Protein: 6.1 g/dL — ABNORMAL LOW (ref 6.5–8.1)

## 2021-02-18 MED ORDER — CEPHALEXIN 500 MG PO CAPS: 500.0000 mg | ORAL_CAPSULE | Freq: Four times a day (QID) | ORAL | 0 refills | Status: DC

## 2021-02-18 MED ORDER — CEPHALEXIN 500 MG PO CAPS
500.0000 mg | ORAL_CAPSULE | Freq: Once | ORAL | Status: AC
  Administered 2021-02-18: 500 mg via ORAL
  Filled 2021-02-18: qty 1

## 2021-02-18 NOTE — Discharge Instructions (Signed)
Return in the morning to get an ultrasound of your leg.  Radiology will tell you what time to come.  If that ultrasound is negative for a blood clot then you should continue the antibiotics and see your family doctor later this week.

## 2021-02-18 NOTE — ED Triage Notes (Signed)
Pt c/o of left leg swelling/redness & drainage.

## 2021-02-18 NOTE — ED Provider Notes (Signed)
[facility] EMERGENCY DEPARTMENT Provider Note   CSN: [id number] Arrival date & time: 02/18/21  1817     History Chief Complaint  Patient presents with   Wound Check    [name] is a 80 y.o. female.  Patient complains of swelling to left lower leg.  Patient complains of some pain I will  The history is provided by the patient and medical records. No language interpreter was used.  Wound Check This is a new problem. The current episode started more than 2 days ago. The problem occurs constantly. The problem has not changed since onset.Pertinent negatives include no chest pain, no abdominal pain and no headaches. Nothing aggravates the symptoms. Nothing relieves the symptoms. She has tried nothing for the symptoms.      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     Family History  Family history unknown: Yes    Social History   Tobacco Use   Smoking status: Former   Smokeless tobacco: Never  Substance Use Topics   Alcohol use: No   Drug use: No    Home Medications Prior to Admission medications   Medication Sig Start Date End Date Taking? Authorizing Provider  apixaban (ELIQUIS) 5 MG TABS tablet Take 5 mg by mouth 2 (two) times daily.   Yes [provider]  aspirin  EC 81 MG tablet Take 81 mg by mouth daily.   Yes [provider]  atorvastatin (LIPITOR) 20 MG tablet Take 40 mg by mouth daily.   Yes [provider]  calcium carbonate (TUMS - DOSED IN MG ELEMENTAL CALCIUM) 500 MG chewable tablet Chew 1 tablet by mouth 2 (two) times daily with a meal.   Yes [provider]  carvedilol (COREG) 12.5 MG tablet GIVE 1.5 TABS (18.75MG ) BY MOUTH TWICE DAILY 12/05/17  Yes Dyann Kief, PA-C  cephALEXin (KEFLEX) 500 MG capsule Take 1 capsule (500 mg total) by mouth 4 (four) times daily. 02/18/21  Yes Bethann Berkshire, MD  cholecalciferol (VITAMIN D3) 25 MCG (1000 UNIT) tablet Take 1,000 Units by mouth daily.   Yes [provider]  escitalopram (LEXAPRO) 5 MG tablet Take 2.5 mg by mouth at bedtime.   Yes [provider]  ferrous sulfate 325 (65 FE) MG tablet Take 325 mg by mouth daily with breakfast.   Yes [provider]  Levothyroxine Sodium 75 MCG CAPS Take 75 mcg by mouth daily before breakfast.   Yes [provider]  Menthol, Topical Analgesic, (BIOFREEZE) 4 % GEL Apply 1 application topically See admin instructions. Apply to right shoulder twice a day   Yes [provider]  mirtazapine (REMERON) 15 MG tablet Take 15 mg by mouth at bedtime.  Yes [provider]  omeprazole (PRILOSEC) 20 MG capsule Take 20 mg by mouth daily.   Yes [provider]  promethazine (PHENERGAN) 12.5 MG tablet Take 12.5 mg by mouth daily as needed for nausea or vomiting.   Yes [provider]  Propylene Glycol (SYSTANE BALANCE OP) Place 1 drop into both eyes 2 (two) times daily.   Yes [provider]  ramipril (ALTACE) 2.5 MG capsule Take 1 capsule (2.5 mg total) by mouth daily. 02/05/17  Yes Dyann Kief, PA-C  vitamin B-12 (CYANOCOBALAMIN) 500 MCG tablet Take 500 mcg by mouth daily.   Yes [provider]  calcium carbonate (OS-CAL - DOSED IN MG OF ELEMENTAL CALCIUM) 1250  (500 Ca) MG tablet Take 1 tablet by mouth daily with breakfast. Patient not taking: Reported on 02/18/2021    [provider]  ergocalciferol (VITAMIN D2) 50000 units capsule Take 50,000 Units by mouth once a week. Patient not taking: No sig reported    [provider]  Loperamide HCl (IMODIUM A-D PO) Take by mouth as needed. Patient not taking: No sig reported    [provider]    Allergies    Sulfa antibiotics  Review of Systems   Review of Systems  Constitutional:  Negative for appetite change and fatigue.  HENT:  Negative for congestion, ear discharge and sinus pressure.   Eyes:  Negative for discharge.  Respiratory:  Negative for cough.   Cardiovascular:  Negative for chest pain.  Gastrointestinal:  Negative for abdominal pain and diarrhea.  Genitourinary:  Negative for frequency and hematuria.  Musculoskeletal:  Negative for back pain.       Swelling left lower leg  Skin:  Negative for rash.  Neurological:  Negative for seizures and headaches.  Psychiatric/Behavioral:  Negative for hallucinations.    Physical Exam Updated Vital Signs BP 118/70   Pulse 90   Temp 98.1 F (36.7 C) (Oral)   Resp 15   Ht 5\' 6"  (1.676 m)   Wt 59 kg   SpO2 100%   BMI 20.98 kg/m   Physical Exam Vitals and nursing note reviewed.  Constitutional:      Appearance: She is well-developed.  HENT:     Head: Normocephalic.     Nose: Nose normal.  Eyes:     General: No scleral icterus.    Conjunctiva/sclera: Conjunctivae normal.  Neck:     Thyroid: No thyromegaly.  Cardiovascular:     Rate and Rhythm: Normal rate and regular rhythm.     Heart sounds: No murmur heard.   No friction rub. No gallop.  Pulmonary:     Breath sounds: No stridor. No wheezing or rales.  Chest:     Chest wall: No tenderness.  Abdominal:     General: There is no distension.     Tenderness: There is no abdominal tenderness. There is no rebound.  Musculoskeletal:     Cervical back:  Neck supple.     Comments: Patient has redness and swelling to left lower leg  Lymphadenopathy:     Cervical: No cervical adenopathy.  Skin:    Findings: No erythema or rash.  Neurological:     Mental Status: She is oriented to person, place, and time.     Motor: No abnormal muscle tone.     Coordination: Coordination normal.  Psychiatric:        Behavior: Behavior normal.    ED Results / Procedures / Treatments   Labs (all labs ordered are  listed, but only abnormal results are displayed) Labs Reviewed  CBC WITH DIFFERENTIAL/PLATELET - Abnormal; Notable for the following components:      Result Value   Hemoglobin 11.1 (*)    HCT 34.7 (*)    All other components within normal limits  COMPREHENSIVE METABOLIC PANEL - Abnormal; Notable for the following components:   Sodium 133 (*)    Glucose, Bld 114 (*)    Calcium 8.6 (*)    Total Protein 6.1 (*)    Albumin 3.4 (*)    All other components within normal limits    EKG None  Radiology No results found.  Procedures Procedures   Medications Ordered in ED Medications  cephALEXin (KEFLEX) capsule 500 mg (has no administration in time range)    ED Course  I have reviewed the triage vital signs and the nursing notes.  Pertinent labs & imaging results that were available during my care of the patient were reviewed by me and considered in my medical decision making (see chart for details).    MDM Rules/Calculators/A&P                           Labs unremarkable.  Patient will be treated for cellulitis with Keflex and will also get a DVT study tomorrow follow-up with her PCP Final Clinical Impression(s) / ED Diagnoses Final diagnoses:  Cellulitis of left lower leg    Rx / DC Orders ED Discharge Orders          Ordered    US Venous Img Lower Unilateral Left        02/18/21 2217    cephALEXin (KEFLEX) 500 MG capsule  4 times daily        02/18/21 2218             Bethann Berkshire, MD 02/18/21 2224

## 2021-02-18 NOTE — ED Notes (Signed)
Pt dressed ; waiting on ems transport to facility.

## 2021-02-18 NOTE — ED Notes (Signed)
Ems arrived

## 2021-02-18 NOTE — ED Notes (Signed)
No answer from facility.

## 2021-02-18 NOTE — ED Notes (Addendum)
Ems arrived to get patient to send back to facility. Pt states that she does not want to go back. Edp to room with rn and ems. Stated that pt is still to be d.c and will need to go back with hx of cognitive impairment.  Facility called and updated on d/c and plan and her hesitation to be sent back., caregiver state that she is the same way over there.

## 2021-02-20 ENCOUNTER — Other Ambulatory Visit: Payer: Self-pay

## 2021-02-20 ENCOUNTER — Emergency Department (HOSPITAL_COMMUNITY)
Admission: EM | Admit: 2021-02-20 | Discharge: 2021-02-21 | Disposition: A | Discharge status: Home / Self Care | Payer: Medicare Other | Attending: Emergency Medicine | Admitting: Emergency Medicine

## 2021-02-20 ENCOUNTER — Encounter (HOSPITAL_COMMUNITY): Payer: Self-pay | Admitting: *Deleted

## 2021-02-20 ENCOUNTER — Telehealth (HOSPITAL_COMMUNITY): Payer: Self-pay | Admitting: Emergency Medicine

## 2021-02-20 ENCOUNTER — Ambulatory Visit (HOSPITAL_COMMUNITY)
Admission: RE | Admit: 2021-02-20 | Discharge: 2021-02-20 | Disposition: A | Discharge status: Home / Self Care | Payer: Medicare Other | Source: Ambulatory Visit | Attending: Emergency Medicine | Admitting: Emergency Medicine

## 2021-02-20 DIAGNOSIS — Z79899 Other long term (current) drug therapy: Secondary | ICD-10-CM | POA: Diagnosis not present

## 2021-02-20 DIAGNOSIS — Z7901 Long term (current) use of anticoagulants: Secondary | ICD-10-CM | POA: Insufficient documentation

## 2021-02-20 DIAGNOSIS — E039 Hypothyroidism, unspecified: Secondary | ICD-10-CM | POA: Insufficient documentation

## 2021-02-20 DIAGNOSIS — N3001 Acute cystitis with hematuria: Secondary | ICD-10-CM | POA: Insufficient documentation

## 2021-02-20 DIAGNOSIS — Z87891 Personal history of nicotine dependence: Secondary | ICD-10-CM | POA: Insufficient documentation

## 2021-02-20 DIAGNOSIS — I10 Essential (primary) hypertension: Secondary | ICD-10-CM | POA: Diagnosis not present

## 2021-02-20 DIAGNOSIS — M7122 Synovial cyst of popliteal space [Baker], left knee: Secondary | ICD-10-CM | POA: Insufficient documentation

## 2021-02-20 DIAGNOSIS — F0390 Unspecified dementia without behavioral disturbance: Secondary | ICD-10-CM | POA: Insufficient documentation

## 2021-02-20 DIAGNOSIS — Z7982 Long term (current) use of aspirin: Secondary | ICD-10-CM | POA: Insufficient documentation

## 2021-02-20 DIAGNOSIS — R300 Dysuria: Secondary | ICD-10-CM | POA: Diagnosis present

## 2021-02-20 DIAGNOSIS — I48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 atrial fibrillation: Secondary | ICD-10-CM | POA: Insufficient documentation

## 2021-02-20 DIAGNOSIS — M79605 Pain in left leg: Secondary | ICD-10-CM | POA: Insufficient documentation

## 2021-02-20 DIAGNOSIS — Z8673 Personal history of transient ischemic attack (TIA), and cerebral infarction without residual deficits: Secondary | ICD-10-CM | POA: Diagnosis not present

## 2021-02-20 LAB — CBC WITH DIFFERENTIAL/PLATELET
Abs Immature Granulocytes: 0.04 10*3/uL (ref 0.00–0.07)
Basophils Absolute: 0 10*3/uL (ref 0.0–0.1)
Basophils Relative: 0 %
Eosinophils Absolute: 0.1 10*3/uL (ref 0.0–0.5)
Eosinophils Relative: 1 %
HCT: 33.5 % — ABNORMAL LOW (ref 36.0–46.0)
Hemoglobin: 10.7 g/dL — ABNORMAL LOW (ref 12.0–15.0)
Immature Granulocytes: 1 %
Lymphocytes Relative: 16 %
Lymphs Abs: 1.3 10*3/uL (ref 0.7–4.0)
MCH: 28.3 pg (ref 26.0–34.0)
MCHC: 31.9 g/dL (ref 30.0–36.0)
MCV: 88.6 fL (ref 80.0–100.0)
Monocytes Absolute: 0.9 10*3/uL (ref 0.1–1.0)
Monocytes Relative: 10 %
Neutro Abs: 6 10*3/uL (ref 1.7–7.7)
Neutrophils Relative %: 72 %
Platelets: 235 10*3/uL (ref 150–400)
RBC: 3.78 MIL/uL — ABNORMAL LOW (ref 3.87–5.11)
RDW: 14.6 % (ref 11.5–15.5)
WBC: 8.4 10*3/uL (ref 4.0–10.5)
nRBC: 0 % (ref 0.0–0.2)

## 2021-02-20 LAB — URINALYSIS, ROUTINE W REFLEX MICROSCOPIC
Bilirubin Urine: NEGATIVE
Glucose, UA: 150 mg/dL — AB
Ketones, ur: NEGATIVE mg/dL
Leukocytes,Ua: NEGATIVE
Nitrite: NEGATIVE
Protein, ur: NEGATIVE mg/dL
RBC / HPF: >50 RBC/hpf — ABNORMAL HIGH (ref 0–5)
Specific Gravity, Urine: 1.006 (ref 1.005–1.030)
pH: 6 (ref 5.0–8.0)

## 2021-02-20 LAB — CBG MONITORING, ED
Glucose-Capillary: 59 mg/dL — ABNORMAL LOW (ref 70–99)
Glucose-Capillary: 60 mg/dL — ABNORMAL LOW (ref 70–99)
Glucose-Capillary: 101 mg/dL — ABNORMAL HIGH (ref 70–99)
Glucose-Capillary: 79 mg/dL (ref 70–99)

## 2021-02-20 LAB — BASIC METABOLIC PANEL
Anion gap: 9 (ref 5–15)
BUN: 16 mg/dL (ref 8–23)
CO2: 25 mmol/L (ref 22–32)
Calcium: 8.3 mg/dL — ABNORMAL LOW (ref 8.9–10.3)
Chloride: 99 mmol/L (ref 98–111)
Creatinine, Ser: 0.69 mg/dL (ref 0.44–1.00)
GFR, Estimated: >60 mL/min (ref >60)
Glucose, Bld: 83 mg/dL (ref 70–99)
Potassium: 4 mmol/L (ref 3.5–5.1)
Sodium: 133 mmol/L — ABNORMAL LOW (ref 135–145)

## 2021-02-20 MED ORDER — SODIUM CHLORIDE 0.9 % IV BOLUS
1000.0000 mL | Freq: Once | INTRAVENOUS | Status: AC
  Administered 2021-02-20: 1000 mL via INTRAVENOUS

## 2021-02-20 MED ORDER — DEXTROSE 50 % IV SOLN
50.0000 mL | Freq: Once | INTRAVENOUS | Status: AC
  Administered 2021-02-20: 50 mL via INTRAVENOUS
  Filled 2021-02-20: qty 50

## 2021-02-20 MED ORDER — SODIUM CHLORIDE 0.9 % IV SOLN
2.0000 g | Freq: Once | INTRAVENOUS | Status: AC
  Administered 2021-02-20: 2 g via INTRAVENOUS
  Filled 2021-02-20: qty 20

## 2021-02-20 NOTE — Telephone Encounter (Signed)
Patient returns for home health. In speaking with nursing facility they feel patient would benefit from home health/nursing. Will place this order. DVT study negative. Patient does have a baker's cyst.

## 2021-02-20 NOTE — Discharge Instructions (Signed)
Continue taking the Keflex antibiotic.  Follow-up with your family doctor 2 to 3 days to check your urine culture

## 2021-02-20 NOTE — ED Triage Notes (Signed)
Patient here to checked for UTI

## 2021-02-20 NOTE — ED Provider Notes (Addendum)
Blood pressure 110/68, pulse 94, temperature 98.1 F (36.7 C), temperature source Oral, resp. rate 17, height 5\' 6"  (1.676 m), weight 59 kg, SpO2 100 %.  In short, [name] is a 80 y.o. female with a chief complaint of Wound Check .  Refer to the original H&P for additional details.   Patient arrives with health aid. 76 reviewed. Plan for PCP follow up. Health aid to arrange. Spoke with staff at facility and placed order for home nursing and PT/OT.   IMPRESSION: 1. Negative for left lower extremity DVT. 2. Left Baker's cyst.     Electronically Signed   By: [place] M.D.   On: 02/20/2021 10:14   02/22/2021, MD 02/20/21 1306    02/22/21, MD 02/20/21 [phone]

## 2021-02-20 NOTE — ED Notes (Signed)
Ensure plus and peanut butter cheese crackers for blood sugar.

## 2021-02-20 NOTE — ED Notes (Signed)
Pt had large bm using bedside commode and assistance.

## 2021-02-20 NOTE — ED Notes (Signed)
Attempted in and out x2 on pt, both tubes clogged, no urine return in either.

## 2021-02-20 NOTE — ED Provider Notes (Signed)
[facility] EMERGENCY DEPARTMENT Provider Note   CSN: [id number] Arrival date & time: 02/20/21  1354     History No chief complaint on file.   [name] is a 80 y.o. female.  Patient with dementia and questional urinary tract infection.  Patient was sent over from the nursing home to check for UTI  The history is provided by the nursing home and medical records. No language interpreter was used.  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     Family History  Family history unknown: Yes    Social History   Tobacco Use   Smoking status: Former   Smokeless tobacco: Never  Substance Use Topics   Alcohol use: No   Drug use: No    Home Medications Prior to Admission medications   Medication Sig Start Date End Date Taking? Authorizing Provider  apixaban (ELIQUIS) 5 MG TABS tablet Take 5 mg by mouth 2 (two) times daily.   Yes [provider]  aspirin EC 81 MG tablet Take 81 mg by mouth daily.   Yes [provider]  atorvastatin  (LIPITOR) 20 MG tablet Take 40 mg by mouth daily.   Yes [provider]  calcium carbonate (TUMS - DOSED IN MG ELEMENTAL CALCIUM) 500 MG chewable tablet Chew 1 tablet by mouth 2 (two) times daily with a meal.   Yes [provider]  carvedilol (COREG) 12.5 MG tablet GIVE 1.5 TABS (18.75MG ) BY MOUTH TWICE DAILY Patient taking differently: Take 12.5 mg by mouth See admin instructions. Take 1 and 1/2 tablet by mouth twice a day 12/05/17  Yes Imogene Burn, PA-C  cholecalciferol (VITAMIN D3) 25 MCG (1000 UNIT) tablet Take 1,000 Units by mouth daily.   Yes [provider]  escitalopram (LEXAPRO) 5 MG tablet Take 2.5 mg by mouth at bedtime.   Yes [provider]  ferrous sulfate 325 (65 FE) MG tablet Take 325 mg by mouth daily with breakfast.   Yes [provider]  Levothyroxine Sodium 75 MCG CAPS Take 75 mcg by mouth daily before breakfast.   Yes [provider]  Menthol, Topical Analgesic, (BIOFREEZE) 4 % GEL Apply 1 application topically See admin instructions. Apply to right shoulder twice a day   Yes [provider]  mirtazapine (REMERON) 15 MG tablet Take 15 mg by mouth at bedtime.   Yes [provider]  omeprazole (PRILOSEC) 20 MG capsule Take 20 mg by mouth daily.  Yes [provider]  promethazine (PHENERGAN) 12.5 MG tablet Take 12.5 mg by mouth daily as needed for nausea or vomiting.   Yes [provider]  Propylene Glycol (SYSTANE BALANCE OP) Place 1 drop into both eyes 2 (two) times daily.   Yes [provider]  ramipril (ALTACE) 2.5 MG capsule Take 1 capsule (2.5 mg total) by mouth daily. 02/05/17  Yes Dyann Kief, PA-C  vitamin B-12 (CYANOCOBALAMIN) 500 MCG tablet Take 500 mcg by mouth daily.   Yes [provider]  calcium carbonate (OS-CAL - DOSED IN MG OF ELEMENTAL CALCIUM) 1250 (500 Ca) MG tablet Take 1 tablet by mouth daily with breakfast. Patient not taking: Reported on  02/18/2021    [provider]  cephALEXin (KEFLEX) 500 MG capsule Take 1 capsule (500 mg total) by mouth 4 (four) times daily. 02/18/21   Bethann Berkshire, MD  ergocalciferol (VITAMIN D2) 50000 units capsule Take 50,000 Units by mouth once a week. Patient not taking: No sig reported    [provider]  Loperamide HCl (IMODIUM A-D PO) Take by mouth as needed. Patient not taking: No sig reported    [provider]    Allergies    Sulfa antibiotics  Review of Systems   Review of Systems  Unable to perform ROS: Dementia  Genitourinary:  Positive for dysuria.   Physical Exam Updated Vital Signs BP 103/67   Pulse (!) 56   Temp 97.6 F (36.4 C) (Oral)   Resp 16   SpO2 100%   Physical Exam Vitals and nursing note reviewed.  Constitutional:      Appearance: She is well-developed.  HENT:     Head: Normocephalic.     Mouth/Throat:     Mouth: Mucous membranes are moist.  Eyes:     General: No scleral icterus.    Conjunctiva/sclera: Conjunctivae normal.  Neck:     Thyroid: No thyromegaly.  Cardiovascular:     Rate and Rhythm: Normal rate and regular rhythm.     Heart sounds: No murmur heard.   No friction rub. No gallop.  Pulmonary:     Breath sounds: No stridor. No wheezing or rales.  Chest:     Chest wall: No tenderness.  Abdominal:     General: There is no distension.     Tenderness: There is no abdominal tenderness. There is no rebound.  Musculoskeletal:        General: Normal range of motion.     Cervical back: Neck supple.  Lymphadenopathy:     Cervical: No cervical adenopathy.  Skin:    Findings: No erythema or rash.  Neurological:     Mental Status: She is alert.     Motor: No abnormal muscle tone.     Coordination: Coordination normal.     Comments: Alert oriented to person only  Psychiatric:        Behavior: Behavior normal.    ED Results / Procedures / Treatments   Labs (all labs ordered are listed, but only abnormal results  are displayed) Labs Reviewed  URINALYSIS, ROUTINE W REFLEX MICROSCOPIC - Abnormal; Notable for the following components:      Result Value   Glucose, UA 150 (*)    Hgb urine dipstick LARGE (*)    RBC / HPF >50 (*)    Bacteria, UA RARE (*)    All other components within normal limits  CBC WITH DIFFERENTIAL/PLATELET - Abnormal; Notable for the following components:   RBC 3.78 (*)  Hemoglobin 10.7 (*)    HCT 33.5 (*)    All other components within normal limits  BASIC METABOLIC PANEL - Abnormal; Notable for the following components:   Sodium 133 (*)    Calcium 8.3 (*)    All other components within normal limits  CBG MONITORING, ED - Abnormal; Notable for the following components:   Glucose-Capillary 60 (*)    All other components within normal limits  CBG MONITORING, ED - Abnormal; Notable for the following components:   Glucose-Capillary 59 (*)    All other components within normal limits  CBG MONITORING, ED - Abnormal; Notable for the following components:   Glucose-Capillary 101 (*)    All other components within normal limits  URINE CULTURE  CBG MONITORING, ED    EKG None  Radiology US Venous Img Lower Unilateral Left  Result Date: 02/20/2021 CLINICAL DATA:  Left lower extremity swelling and pain EXAM: LEFT LOWER EXTREMITY VENOUS DOPPLER ULTRASOUND TECHNIQUE: Gray-scale sonography with compression, as well as color and duplex ultrasound, were performed to evaluate the deep venous system(s) from the level of the common femoral vein through the popliteal and proximal calf veins. COMPARISON:  None. FINDINGS: VENOUS Normal compressibility of the common femoral, superficial femoral, and popliteal veins, as well as the visualized calf veins. Visualized portions of profunda femoral vein and great saphenous vein unremarkable. No filling defects to suggest DVT on grayscale or color Doppler imaging. Doppler waveforms show normal direction of venous flow, normal respiratory plasticity  and response to augmentation. There is limited visualization of calf veins due to overlying bandage, but the seen veins are patent. Limited views of the contralateral common femoral vein are unremarkable. Subcutaneous edema at the level of the leg. Ovoid collection in the left popliteal fossa measuring 29 x 22 x 9 mm. IMPRESSION: 1. Negative for left lower extremity DVT. 2. Left Baker's cyst. Electronically Signed   By: Jorje Guild M.D.   On: 02/20/2021 10:14    Procedures Procedures   Medications Ordered in ED Medications  sodium chloride 0.9 % bolus 1,000 mL (0 mLs Intravenous Stopped 02/20/21 1846)  cefTRIAXone (ROCEPHIN) 2 g in sodium chloride 0.9 % 100 mL IVPB (0 g Intravenous Stopped 02/20/21 1824)  dextrose 50 % solution 50 mL (50 mLs Intravenous Given 02/20/21 1821)    ED Course  I have reviewed the triage vital signs and the nursing notes.  Pertinent labs & imaging results that were available during my care of the patient were reviewed by me and considered in my medical decision making (see chart for details).    MDM Rules/Calculators/A&P                           Patient with urinary tract infection.  She is given a shot of Rocephin and will continue the Keflex she started yesterday and follow-up with her PCP Final Clinical Impression(s) / ED Diagnoses Final diagnoses:  Acute cystitis with hematuria    Rx / DC Orders ED Discharge Orders     None        Milton Ferguson, MD 02/24/21 [phone]

## 2021-02-22 LAB — URINE CULTURE: Culture: NO GROWTH

## 2021-02-28 ENCOUNTER — Telehealth (HOSPITAL_COMMUNITY): Payer: Self-pay | Admitting: *Deleted

## 2021-02-28 NOTE — Telephone Encounter (Signed)
2nd attempt to contact transportation to schedule patient for OP MBS. Left VM. RKEEL

## 2021-03-10 ENCOUNTER — Telehealth (HOSPITAL_COMMUNITY): Payer: Self-pay

## 2021-03-10 NOTE — Telephone Encounter (Signed)
2nd attempted to contact Assisted Living to schedule patient for OP MBS - left voicemail.

## 2021-04-20 ENCOUNTER — Ambulatory Visit (INDEPENDENT_AMBULATORY_CARE_PROVIDER_SITE_OTHER): Payer: Medicare Other | Admitting: Podiatry

## 2021-04-20 ENCOUNTER — Other Ambulatory Visit: Payer: Self-pay

## 2021-04-20 DIAGNOSIS — L89892 Pressure ulcer of other site, stage 2: Secondary | ICD-10-CM

## 2021-04-20 MED ORDER — MUPIROCIN 2 % EX OINT: 1.0000 "application " | TOPICAL_OINTMENT | Freq: Two times a day (BID) | CUTANEOUS | 2 refills | Status: DC

## 2021-04-23 DIAGNOSIS — R1312 Dysphagia, oropharyngeal phase: Secondary | ICD-10-CM | POA: Insufficient documentation

## 2021-04-23 DIAGNOSIS — R1310 Dysphagia, unspecified: Secondary | ICD-10-CM | POA: Insufficient documentation

## 2021-04-25 NOTE — Progress Notes (Signed)
°  Subjective:  Patient ID: [name], female    DOB: January 30, 1941,  MRN: [id number]  Chief Complaint  Patient presents with   Callouses      L large callus on bottom of foot     81 y.o. female presents with the above complaint. History confirmed with patient.  She is here with a care provider.  She lives in assisted nursing facility.  She is on Eliquis.  Objective:  Physical Exam: warm, good capillary refill, no trophic changes or ulcerative lesions, normal DP and PT pulses, and normal sensory exam. Left Foot: Prominent bone and plantar midfoot there is a full-thickness ulceration deep to a preulcerative callus measuring 0.6 x 0.3 x 0.3 cm with exposed subcutaneous tissue no signs of infection cellulitis or exposed tendinous structures or bone.     Assessment:   1. Pressure ulcer of left foot, stage 2 ([place])      Plan:  Patient was evaluated and treated and all questions answered.  Ulcer left foot -We discussed the etiology and factors that are a part of the wound healing process.  We also discussed the risk of infection both soft tissue and osteomyelitis from open ulceration.  Discussed the risk of limb loss if this happens or worsens. -Debridement as below. -Dressed with Iodosorb, DSD. -Continue home dressing changes daily with 4 x 4 gauze and Bactroban which I gave her a prescription for -Continue off-loading with surgical shoe and peg assist device. -Vascular testing pulses are palpable -HgbA1c: She is nondiabetic -Last antibiotics: N/A   Procedure: Excisional Debridement of Wound Rationale: Removal of non-viable soft tissue from the wound to promote healing.  Anesthesia: none Post-Debridement Wound Measurements: 0.6 cm x 0.3 cm x 0.3 cm  Type of Debridement: Sharp Excisional Tissue Removed: Non-viable soft tissue Depth of Debridement: subcutaneous tissue. Technique: Sharp excisional debridement to bleeding, viable wound base.  Dressing: Dry, sterile, compression  dressing. Disposition: Patient tolerated procedure well.         Return in about 3 weeks (around 05/11/2021) for wound care.

## 2021-05-11 ENCOUNTER — Ambulatory Visit (INDEPENDENT_AMBULATORY_CARE_PROVIDER_SITE_OTHER): Payer: Medicare Other | Admitting: Podiatry

## 2021-05-11 ENCOUNTER — Other Ambulatory Visit: Payer: Self-pay

## 2021-05-11 DIAGNOSIS — M2142 Flat foot [pes planus] (acquired), left foot: Secondary | ICD-10-CM

## 2021-05-11 DIAGNOSIS — Q6680 Congenital vertical talus deformity, unspecified foot: Secondary | ICD-10-CM

## 2021-05-11 DIAGNOSIS — M2141 Flat foot [pes planus] (acquired), right foot: Secondary | ICD-10-CM

## 2021-05-11 DIAGNOSIS — L89892 Pressure ulcer of other site, stage 2: Secondary | ICD-10-CM | POA: Diagnosis not present

## 2021-05-11 NOTE — Progress Notes (Signed)
°  Subjective:  Patient ID: [name], female    DOB: 25-Nov-1940,  MRN: [id number]  Chief Complaint  Patient presents with   Callouses      Left foot  - large callus on bottom of foot ; h/o ulcer    81 y.o. female presents with the above complaint. History confirmed with patient.  She is here with a care provider.  She lives in assisted nursing facility.  She is on Eliquis.  Interval history: Since this visit they have been applying the mupirocin ointment as directed and wearing the surgical shoe with the offloading insert and this is helped quite a bit.  She says it feels much better than it did.  Objective:  Physical Exam: warm, good capillary refill, no trophic changes or ulcerative lesions, normal DP and PT pulses, and normal sensory exam. Left Foot: Prominent bone and plantar midfoot there is a full-thickness ulceration deep to a preulcerative callus measuring 0.2 x 0.4 x 0.2 cm with exposed subcutaneous tissue no signs of infection cellulitis or exposed tendinous structures or bone.  She is severe rocker-bottom foot deformity with prominence of the likely cuboid and talonavicular joint on the plantar surface underlying the ulcer      Assessment:   1. Pressure ulcer of left foot, stage 2 ([place])   2. Pes planus of both feet   3. Rocker bottom foot deformity      Plan:  Patient was evaluated and treated and all questions answered.  Ulcer left foot -We discussed the etiology and factors that are a part of the wound healing process.  We also discussed the risk of infection both soft tissue and osteomyelitis from open ulceration.  Discussed the risk of limb loss if this happens or worsens. -Debridement as below. -Dressed with Iodosorb, DSD. -Continue home dressing changes daily with 4 x 4 gauze and Bactroban -Continue off-loading with surgical shoe and peg assist device. -Vascular testing pulses are palpable -HgbA1c: She is nondiabetic -Last antibiotics: N/A -She seems  to be healing well at this point.  I debrided the ulcer again today as noted below.  Long-term I think we need to look towards a offloading solution such as a neuropathic EC walker which I discussed with my orthotist today Aaron Edelman Little.  She will be scheduled to see him as well at the same time for her next visit.    Procedure: Excisional Debridement of Wound Rationale: Removal of non-viable soft tissue from the wound to promote healing.  Anesthesia: none Post-Debridement Wound Measurements: 0.2 to 0.4 x 0.2 type of Debridement: Sharp Excisional Tissue Removed: Non-viable soft tissue Depth of Debridement: subcutaneous tissue. Technique: Sharp excisional debridement to bleeding, viable wound base.  Dressing: Dry, sterile, compression dressing. Disposition: Patient tolerated procedure well.         Return in about 3 weeks (around 06/01/2021) for wound care.

## 2021-06-05 ENCOUNTER — Ambulatory Visit: Payer: Medicare Other | Admitting: Podiatry

## 2021-06-06 ENCOUNTER — Other Ambulatory Visit: Payer: Self-pay

## 2021-06-06 ENCOUNTER — Ambulatory Visit: Payer: Medicare Other

## 2021-06-06 ENCOUNTER — Ambulatory Visit (INDEPENDENT_AMBULATORY_CARE_PROVIDER_SITE_OTHER): Payer: Medicare Other | Admitting: Podiatry

## 2021-06-06 DIAGNOSIS — L97522 Non-pressure chronic ulcer of other part of left foot with fat layer exposed: Secondary | ICD-10-CM | POA: Diagnosis not present

## 2021-06-06 DIAGNOSIS — Q6680 Congenital vertical talus deformity, unspecified foot: Secondary | ICD-10-CM

## 2021-06-06 DIAGNOSIS — M21962 Unspecified acquired deformity of left lower leg: Secondary | ICD-10-CM

## 2021-06-06 DIAGNOSIS — L89892 Pressure ulcer of other site, stage 2: Secondary | ICD-10-CM

## 2021-06-06 NOTE — Progress Notes (Signed)
Patient seen for evaluation for [facility] Neu[facility]. Discussed insurance and out of pocket cost with patient. Patient would like to submit a precertification to verify out of pocket cost before proceeding with fabrication. Precert to be generated and patient to be contacted to determine next step in plan of care.

## 2021-06-06 NOTE — Patient Instructions (Addendum)
Foot ulcer improving in size. New small ulcer on 4th toe today.  Continue mupirocin to foot ulcer daily with bandage. Begin applying to fourth toe as well and change daily with gauze and tape (or coban / compression wrap).    Continue using the offloading shoe for now. She was fitted for a more long term offloading brace/boot.

## 2021-06-11 ENCOUNTER — Encounter: Payer: Self-pay | Admitting: Podiatry

## 2021-06-11 NOTE — Progress Notes (Signed)
°  Subjective:  Patient ID: [name], female    DOB: 03-21-1941,  MRN: [id number]  Chief Complaint  Patient presents with   Foot Ulcer       L large callus on bottom of foot/ WOUND CARE    81 y.o. female presents with the above complaint. History confirmed with patient.  She is here with a care provider.  She lives in assisted nursing facility.  She is on Eliquis.  Interval history: Still applying mupirocin ointment and using the peg assist shoe.  Seems like is making some improvement.  Objective:  Physical Exam: warm, good capillary refill, no trophic changes or ulcerative lesions, normal DP and PT pulses, and normal sensory exam. Left Foot: Prominent bone and plantar midfoot there is a full-thickness ulceration deep to a preulcerative callus measuring 0.2 x 0.2 x 0.2 cm with exposed subcutaneous tissue no signs of infection cellulitis or exposed tendinous structures or bone.  There is also a 0.2 time 0.2 x 0.2 cm full-thickness ulceration to subcutaneous tissue of the fourth toe.  She has severe rocker-bottom foot deformity with prominence of the likely cuboid and talonavicular joint on the plantar surface underlying the ulcer         Assessment:   1. Pressure ulcer of left foot, stage 2 ([place])   2. Ulcer of toe, left, with fat layer exposed ([place])   3. Rocker bottom foot deformity   4. Acquired deformity of left ankle      Plan:  Patient was evaluated and treated and all questions answered.  Ulcer left foot -We discussed the etiology and factors that are a part of the wound healing process.  We also discussed the risk of infection both soft tissue and osteomyelitis from open ulceration.  Discussed the risk of limb loss if this happens or worsens. -Debridement as below. -Dressed with Iodosorb, DSD. -Continue home dressing changes daily with 4 x 4 gauze and Bactroban -Continue off-loading with surgical shoe and peg assist device. -Vascular testing pulses are  palpable -HgbA1c: She is nondiabetic -Last antibiotics: N/A -She is avadomide orthotist today Aaron Edelman Little and was fitted for a neuropathic walker.  Procedure: Excisional Debridement of Wound Rationale: Removal of non-viable soft tissue from the wound to promote healing.  Anesthesia: none Post-Debridement Wound Measurements: 0.2 to 0.3 x 0.2 plantar midfoot and 0.2 x 0.2 x 0.2 cm fourth toe plantar  type of Debridement: Sharp Excisional Tissue Removed: Non-viable soft tissue Depth of Debridement: subcutaneous tissue. Technique: Sharp excisional debridement to bleeding, viable wound base.  Dressing: Dry, sterile, compression dressing. Disposition: Patient tolerated procedure well.         Return in about 4 weeks (around 07/04/2021) for wound care.

## 2021-07-06 ENCOUNTER — Ambulatory Visit: Payer: Medicare Other | Admitting: Podiatry

## 2021-07-07 ENCOUNTER — Encounter (HOSPITAL_COMMUNITY): Payer: Self-pay | Admitting: Emergency Medicine

## 2021-07-07 ENCOUNTER — Emergency Department (HOSPITAL_COMMUNITY): Payer: Medicare Other

## 2021-07-07 ENCOUNTER — Other Ambulatory Visit: Payer: Self-pay

## 2021-07-07 ENCOUNTER — Emergency Department (HOSPITAL_COMMUNITY)
Admission: EM | Admit: 2021-07-07 | Discharge: 2021-07-08 | Disposition: A | Discharge status: Skilled Nursing Facility | Payer: Medicare Other | Attending: Emergency Medicine | Admitting: Emergency Medicine

## 2021-07-07 DIAGNOSIS — E039 Hypothyroidism, unspecified: Secondary | ICD-10-CM | POA: Diagnosis not present

## 2021-07-07 DIAGNOSIS — J1282 Pneumonia due to coronavirus disease 2019: Secondary | ICD-10-CM

## 2021-07-07 DIAGNOSIS — Z79899 Other long term (current) drug therapy: Secondary | ICD-10-CM | POA: Diagnosis not present

## 2021-07-07 DIAGNOSIS — U071 COVID-19: Secondary | ICD-10-CM | POA: Diagnosis not present

## 2021-07-07 DIAGNOSIS — R0602 Shortness of breath: Secondary | ICD-10-CM | POA: Diagnosis not present

## 2021-07-07 DIAGNOSIS — Z7901 Long term (current) use of anticoagulants: Secondary | ICD-10-CM | POA: Diagnosis not present

## 2021-07-07 DIAGNOSIS — R770 Abnormality of albumin: Secondary | ICD-10-CM | POA: Insufficient documentation

## 2021-07-07 DIAGNOSIS — E876 Hypokalemia: Secondary | ICD-10-CM | POA: Insufficient documentation

## 2021-07-07 DIAGNOSIS — D649 Anemia, unspecified: Secondary | ICD-10-CM | POA: Diagnosis not present

## 2021-07-07 DIAGNOSIS — F0390 Unspecified dementia without behavioral disturbance: Secondary | ICD-10-CM | POA: Diagnosis not present

## 2021-07-07 DIAGNOSIS — R7989 Other specified abnormal findings of blood chemistry: Secondary | ICD-10-CM | POA: Diagnosis not present

## 2021-07-07 DIAGNOSIS — J189 Pneumonia, unspecified organism: Secondary | ICD-10-CM | POA: Diagnosis not present

## 2021-07-07 DIAGNOSIS — I10 Essential (primary) hypertension: Secondary | ICD-10-CM | POA: Insufficient documentation

## 2021-07-07 DIAGNOSIS — R5383 Other fatigue: Secondary | ICD-10-CM | POA: Diagnosis present

## 2021-07-07 DIAGNOSIS — Z7982 Long term (current) use of aspirin: Secondary | ICD-10-CM | POA: Insufficient documentation

## 2021-07-07 HISTORY — DX: Gastro-esophageal reflux disease without esophagitis: K21.9

## 2021-07-07 HISTORY — DX: Arteritis, unspecified: I77.6

## 2021-07-07 HISTORY — DX: Other ocular manifestations of vitamin A deficiency: E50.7

## 2021-07-07 HISTORY — DX: Unspecified atrial fibrillation: I48.91

## 2021-07-07 HISTORY — DX: Anemia, unspecified: D64.9

## 2021-07-07 LAB — COMPREHENSIVE METABOLIC PANEL
ALT: 13 U/L (ref 0–44)
AST: 22 U/L (ref 15–41)
Albumin: 3.3 g/dL — ABNORMAL LOW (ref 3.5–5.0)
Alkaline Phosphatase: 89 U/L (ref 38–126)
Anion gap: 10 (ref 5–15)
BUN: 25 mg/dL — ABNORMAL HIGH (ref 8–23)
CO2: 28 mmol/L (ref 22–32)
Calcium: 8.6 mg/dL — ABNORMAL LOW (ref 8.9–10.3)
Chloride: 99 mmol/L (ref 98–111)
Creatinine, Ser: 0.94 mg/dL (ref 0.44–1.00)
GFR, Estimated: >60 mL/min (ref >60)
Glucose, Bld: 88 mg/dL (ref 70–99)
Potassium: 3.4 mmol/L — ABNORMAL LOW (ref 3.5–5.1)
Sodium: 137 mmol/L (ref 135–145)
Total Bilirubin: 0.6 mg/dL (ref 0.3–1.2)
Total Protein: 6.5 g/dL (ref 6.5–8.1)

## 2021-07-07 LAB — TROPONIN I (HIGH SENSITIVITY)
Troponin I (High Sensitivity): 16 ng/L (ref <18)
Troponin I (High Sensitivity): 15 ng/L (ref <18)

## 2021-07-07 LAB — CBC WITH DIFFERENTIAL/PLATELET
Abs Immature Granulocytes: 0.03 10*3/uL (ref 0.00–0.07)
Basophils Absolute: 0 10*3/uL (ref 0.0–0.1)
Basophils Relative: 0 %
Eosinophils Absolute: 0 10*3/uL (ref 0.0–0.5)
Eosinophils Relative: 0 %
HCT: 30.2 % — ABNORMAL LOW (ref 36.0–46.0)
Hemoglobin: 9.1 g/dL — ABNORMAL LOW (ref 12.0–15.0)
Immature Granulocytes: 0 %
Lymphocytes Relative: 21 %
Lymphs Abs: 2.1 10*3/uL (ref 0.7–4.0)
MCH: 26.8 pg (ref 26.0–34.0)
MCHC: 30.1 g/dL (ref 30.0–36.0)
MCV: 88.8 fL (ref 80.0–100.0)
Monocytes Absolute: 0.7 10*3/uL (ref 0.1–1.0)
Monocytes Relative: 8 %
Neutro Abs: 6.9 10*3/uL (ref 1.7–7.7)
Neutrophils Relative %: 71 %
Platelets: 199 10*3/uL (ref 150–400)
RBC: 3.4 MIL/uL — ABNORMAL LOW (ref 3.87–5.11)
RDW: 17.1 % — ABNORMAL HIGH (ref 11.5–15.5)
WBC: 9.8 10*3/uL (ref 4.0–10.5)
nRBC: 0 % (ref 0.0–0.2)

## 2021-07-07 LAB — BRAIN NATRIURETIC PEPTIDE: B Natriuretic Peptide: 179 pg/mL — ABNORMAL HIGH (ref 0.0–100.0)

## 2021-07-07 LAB — PROCALCITONIN: Procalcitonin: 1.36 ng/mL

## 2021-07-07 MED ORDER — ESCITALOPRAM OXALATE 5 MG PO TABS
2.5000 mg | ORAL_TABLET | Freq: Every day | ORAL | Status: DC
  Filled 2021-07-07 (×3): qty 1

## 2021-07-07 MED ORDER — CARVEDILOL 3.125 MG PO TABS
18.7500 mg | ORAL_TABLET | Freq: Once | ORAL | Status: AC
  Administered 2021-07-07: 18.75 mg via ORAL
  Filled 2021-07-07: qty 2

## 2021-07-07 MED ORDER — APIXABAN 5 MG PO TABS
5.0000 mg | ORAL_TABLET | Freq: Two times a day (BID) | ORAL | Status: DC
  Administered 2021-07-07: 5 mg via ORAL
  Filled 2021-07-07: qty 1

## 2021-07-07 MED ORDER — AZITHROMYCIN 250 MG PO TABS
500.0000 mg | ORAL_TABLET | Freq: Every day | ORAL | Status: DC
  Administered 2021-07-07: 500 mg via ORAL
  Filled 2021-07-07: qty 2

## 2021-07-07 MED ORDER — AMOXICILLIN-POT CLAVULANATE 875-125 MG PO TABS: 1.0000 | ORAL_TABLET | Freq: Two times a day (BID) | ORAL | Status: DC

## 2021-07-07 MED ORDER — CEFDINIR 300 MG PO CAPS
300.0000 mg | ORAL_CAPSULE | Freq: Two times a day (BID) | ORAL | Status: DC
  Administered 2021-07-07: 300 mg via ORAL
  Filled 2021-07-07: qty 1

## 2021-07-07 MED ORDER — AZITHROMYCIN 250 MG PO TABS: 250.0000 mg | ORAL_TABLET | Freq: Every day | ORAL | 0 refills | Status: DC

## 2021-07-07 MED ORDER — CEFDINIR 300 MG PO CAPS
300.0000 mg | ORAL_CAPSULE | Freq: Two times a day (BID) | ORAL | 0 refills | Status: DC
Start: 2021-07-07 — End: 2021-08-14

## 2021-07-07 MED ORDER — MOLNUPIRAVIR EUA 200MG CAPSULE
4.0000 | ORAL_CAPSULE | Freq: Two times a day (BID) | ORAL | Status: DC
  Administered 2021-07-07: 800 mg via ORAL
  Filled 2021-07-07: qty 4

## 2021-07-07 MED ORDER — MIRTAZAPINE 15 MG PO TABS
15.0000 mg | ORAL_TABLET | Freq: Every day | ORAL | Status: DC
Start: 2021-07-07 — End: 2021-07-08
  Administered 2021-07-07: 15 mg via ORAL
  Filled 2021-07-07: qty 1

## 2021-07-07 NOTE — Consult Note (Addendum)
?                               ? ?                                                    ? ?Patient Demographics ? ?[name], is a 81 y.o. female   MRN: [id number]   DOB - [birth year] ? ?Admit Date - 07/07/2021   ? ?Outpatient Primary MD for the patient is Default, Provider, MD ? ?Consult requested in the Hospital by Milton Ferguson, MD, On 07/07/2021  ? ? ?Reason for consult :COVID-19 infection, evaluation for need of admission ? ? ?With History of - ? ?Past Medical History:  ?Diagnosis Date  ? Anemia   ? Atrial fibrillation ([place])   ? Chest pain syndrome   ? GERD (gastroesophageal reflux disease)   ? Hyperlipidemia   ? Hypertension   ? Hypothyroidism   ? Stroke [facility]) 08/2016  ? Vasculitis ([place])   ? digital vasculitits per nursing home fl2  ? Vision abnormalities   ? as result of stroke  ? Xerophthalmia   ?   ? ?Past Surgical History:  ?Procedure Laterality Date  ? CHOLECYSTECTOMY    ? leg surgery    ? TONSILLECTOMY    ? ? ?in for  ? ?Chief Complaint  ?Patient presents with  ? Shortness of Breath  ?  ? ?HPI ? ?[name]  is a 81 y.o. female, last medical history of atrial fibrillation, on Eliquis, chronic diastolic CHF, history of crest syndrome, CVA with aphasia, chin, hyperlipidemia, patient lives at assisted living facility, she was brought to ED for low saturation reading, patient was recently diagnosed with COVID-19 infection, she reports she is vaccinated against COVID, her main complaints is dyspnea with activity, has cough, fever, she denies any poor appetite or decreased fluid intake, patient was brought by facility due to low O2 sats, EMS stated they were 99% on room air when they arrived, while in ED she maintained between 95 to 100% on room air, she does report generalized weakness, denies any focal deficits, fever, chills, she currently denies any cough or productive sputum. ?-ED her saturation 95 to 100% on room air, without significant lab abnormalities besides baseline anemia with hemoglobin  of 9, chest x-ray significant for new patchy infiltrate in the left lower lung fields suggesting pneumonia, and small bilateral pleural effusions more so on the left side, her procalcitonin mildly elevated at 1.35, [facility] hospitalist consulted to evaluate for need of admission. ? ? ? ?Review of Systems   ? ?In addition to the HPI above,  ? ?A full 10 point Review of Systems was done, except as stated above, all other Review of Systems were negative. ? ? ?Social History ?Social History  ? ?Tobacco Use  ? Smoking status: Former  ? Smokeless tobacco: Never  ?Substance Use Topics  ? Alcohol use: No  ? ? ?Family History ?Family History  ?Family history unknown: Yes  ? ? ?Prior to Admission medications   ?Medication Sig Start Date End Date Taking? Authorizing Provider  ?apixaban (ELIQUIS) 5 MG TABS tablet Take 5 mg by mouth 2 (two) times daily.   Yes [provider]  ?aspirin EC 81 MG tablet Take 81 mg by mouth daily.  Yes [provider]  ?atorvastatin (LIPITOR) 20 MG tablet Take 40 mg by mouth daily.   Yes [provider]  ?calcium carbonate (TUMS - DOSED IN MG ELEMENTAL CALCIUM) 500 MG chewable tablet Chew 1 tablet by mouth 2 (two) times daily with a meal.   Yes [provider]  ?carvedilol (COREG) 12.5 MG tablet GIVE 1.5 TABS (18.75MG ) BY MOUTH TWICE DAILY ?Patient taking differently: Take 12.5 mg by mouth See admin instructions. Take 1 and 1/2 tablet by mouth twice a day 12/05/17  Yes Imogene Burn, PA-C  ?cholecalciferol (VITAMIN D3) 25 MCG (1000 UNIT) tablet Take 1,000 Units by mouth daily.   Yes [provider]  ?escitalopram (LEXAPRO) 5 MG tablet Take 2.5 mg by mouth at bedtime.   Yes [provider]  ?ferrous sulfate 325 (65 FE) MG tablet Take 325 mg by mouth daily with breakfast.   Yes [provider]  ?furosemide (LASIX) 20 MG tablet Take 20 mg by mouth daily.   Yes [provider]  ?Levothyroxine Sodium 75 MCG CAPS Take 75 mcg by mouth  daily before breakfast.   Yes [provider]  ?Menthol, Topical Analgesic, (BIOFREEZE) 4 % GEL Apply 1 application topically See admin instructions. Apply to right shoulder twice a day   Yes [provider]  ?mirtazapine (REMERON) 15 MG tablet Take 15 mg by mouth at bedtime.   Yes [provider]  ?mupirocin ointment (BACTROBAN) 2 % Apply 1 application topically 2 (two) times daily. 04/20/21  Yes McDonald, Stephan Minister, DPM  ?omeprazole (PRILOSEC) 20 MG capsule Take 20 mg by mouth daily.   Yes [provider]  ?promethazine (PHENERGAN) 12.5 MG tablet Take 12.5 mg by mouth daily as needed for nausea or vomiting.   Yes [provider]  ?Propylene Glycol (SYSTANE BALANCE OP) Place 1 drop into both eyes 2 (two) times daily.   Yes [provider]  ?ramipril (ALTACE) 2.5 MG capsule Take 1 capsule (2.5 mg total) by mouth daily. 02/05/17  Yes Imogene Burn, PA-C  ?vitamin B-12 (CYANOCOBALAMIN) 500 MCG tablet Take 500 mcg by mouth daily.   Yes [provider]  ? ? ?Anti-infectives (From admission, onward)  ? ? Start     Dose/Rate Route Frequency Ordered Stop  ? 07/07/21 2200  molnupiravir EUA (LAGEVRIO) capsule 800 mg       ? 4 capsule Oral 2 times daily 07/07/21 2113 07/12/21 2159  ? 07/07/21 2200  amoxicillin-clavulanate (AUGMENTIN) 875-125 MG per tablet 1 tablet  Status:  Discontinued       ? 1 tablet Oral Every 12 hours 07/07/21 2155 07/07/21 2156  ? 07/07/21 2200  azithromycin (ZITHROMAX) tablet 500 mg       ? 500 mg Oral Daily 07/07/21 2156 07/12/21 0959  ? 07/07/21 2200  cefdinir (OMNICEF) capsule 300 mg       ? 300 mg Oral Every 12 hours 07/07/21 2156 07/12/21 2159  ? ?  ? ? ?Scheduled Meds: ? apixaban  5 mg Oral BID  ? azithromycin  500 mg Oral Daily  ? carvedilol  18.75 mg Oral Once  ? cefdinir  300 mg Oral Q12H  ? escitalopram  2.5 mg Oral QHS  ? mirtazapine  15 mg Oral QHS  ? molnupiravir EUA  4 capsule Oral BID  ? ?Continuous Infusions: ?PRN  Meds:. ? ?Allergies  ?Allergen Reactions  ? Sulfa Antibiotics Other (See Comments)  ?  Don't remember reaction  ? ? ?Physical Exam ? ?Vitals ? ?  Blood pressure (!) 119/105, pulse (!) 103, temperature 98.5 ?F (36.9 ?C), temperature source Oral, resp. rate 16, height 5\' 6"  (1.676 m), weight 55.7 kg, SpO2 92 %. ? ? ?1. General thin appearing appearing female, sitting in bed in no apparent distress ? ?2. Normal affect and insight, Not Suicidal or Homicidal, Awake Alert, Oriented X 3. ? ?3. No F.N deficits, ALL C.Nerves Intact, Strength 5/5 all 4 extremities, Sensation intact all 4 extremities, Plantars down going. ? ?4. Ears and Eyes appear Normal, Conjunctivae clear, PERRLA. Moist Oral Mucosa. ? ?5. Supple Neck, No JVD, No cervical lymphadenopathy appriciated, No Carotid Bruits. ? ?6. Symmetrical Chest wall movement, Good air movement bilaterally, CTAB. ? ?7.  Irregular irregular , No Gallops, Rubs or Murmurs, No Parasternal Heave. ? ?8. Positive Bowel Sounds, Abdomen Soft, No tenderness, No organomegaly appriciated,No rebound -guarding or rigidity. ? ?9.  No Cyanosis, Normal Skin Turgor, No Skin Rash or Bruise.  Patient with typical crest syndrome skin in her extremities and face, and ? ?Data Review ? ?CBC ?Recent Labs  ?Lab 07/07/21 ?1357  ?WBC 9.8  ?HGB 9.1*  ?HCT 30.2*  ?PLT 199  ?MCV 88.8  ?MCH 26.8  ?MCHC 30.1  ?RDW 17.1*  ?LYMPHSABS 2.1  ?MONOABS 0.7  ?EOSABS 0.0  ?BASOSABS 0.0  ? ?------------------------------------------------------------------------------------------------------------------ ? ?Chemistries  ?Recent Labs  ?Lab 07/07/21 ?1357  ?NA 137  ?K 3.4*  ?CL 99  ?CO2 28  ?GLUCOSE 88  ?BUN 25*  ?CREATININE 0.94  ?CALCIUM 8.6*  ?AST 22  ?ALT 13  ?ALKPHOS 89  ?BILITOT 0.6  ? ?------------------------------------------------------------------------------------------------------------------ ?estimated creatinine clearance is 42.7 mL/min (by C-G formula based on SCr of 0.94  mg/dL). ?------------------------------------------------------------------------------------------------------------------ ?No results for input(s): TSH, T4TOTAL, T3FREE, THYROIDAB in the last 72 hours. ? ?Invalid input(s): FREET3 ? ? ?Coagulation

## 2021-07-07 NOTE — ED Provider Notes (Addendum)
?[facility] EMERGENCY DEPARTMENT ?Provider Note ? ? ?CSN: [id number] ?Arrival date & time: 07/07/21  1256 ? ?  ? ?History ?Chief Complaint  ?Patient presents with  ? Shortness of Breath  ? ? ?[name] is a 81 y.o. female with history of hypertension, stroke, A-fib, dementia, hypothyroidism, crest syndrome, hyperlipidemia presents to the emergency department for evaluation of fatigue, runny nose, nasal congestion and some hoarseness with occasional productive cough since yesterday.  Patient reports she tested COVID positive yesterday while at her assisted living facility.  She denies any recorded fever, ear pain, chest pain, hemoptysis, abdominal pain, nausea, or vomiting.  She denies any leg swelling.  The patient is not sure of the rest of her medical history or daily medications due to her dementia. ? ? ?Shortness of Breath ?Associated symptoms: cough   ?Associated symptoms: no abdominal pain, no chest pain, no fever, no headaches, no sore throat and no vomiting   ? ?  ? ?Home Medications ?Prior to Admission medications   ?Medication Sig Start Date End Date Taking? Authorizing Provider  ?apixaban (ELIQUIS) 5 MG TABS tablet Take 5 mg by mouth 2 (two) times daily.   Yes [provider]  ?aspirin EC 81 MG tablet Take 81 mg by mouth daily.   Yes [provider]  ?atorvastatin (LIPITOR) 20 MG tablet Take 40 mg by mouth daily.   Yes [provider]  ?calcium carbonate (TUMS - DOSED IN MG ELEMENTAL CALCIUM) 500 MG chewable tablet Chew 1 tablet by mouth 2 (two) times daily with a meal.   Yes [provider]  ?carvedilol (COREG) 12.5 MG tablet GIVE 1.5 TABS (18.75MG ) BY MOUTH TWICE DAILY ?Patient taking differently: Take 12.5 mg by mouth See admin instructions. Take 1 and 1/2 tablet by mouth twice a day 12/05/17  Yes Dyann Kief, PA-C  ?cholecalciferol (VITAMIN D3) 25 MCG (1000 UNIT) tablet Take 1,000 Units by mouth daily.   Yes [provider]  ?escitalopram (LEXAPRO)  5 MG tablet Take 2.5 mg by mouth at bedtime.   Yes [provider]  ?ferrous sulfate 325 (65 FE) MG tablet Take 325 mg by mouth daily with breakfast.   Yes [provider]  ?furosemide (LASIX) 20 MG tablet Take 20 mg by mouth daily.   Yes [provider]  ?Levothyroxine Sodium 75 MCG CAPS Take 75 mcg by mouth daily before breakfast.   Yes [provider]  ?Menthol, Topical Analgesic, (BIOFREEZE) 4 % GEL Apply 1 application topically See admin instructions. Apply to right shoulder twice a day   Yes [provider]  ?mirtazapine (REMERON) 15 MG tablet Take 15 mg by mouth at bedtime.   Yes [provider]  ?mupirocin ointment (BACTROBAN) 2 % Apply 1 application topically 2 (two) times daily. 04/20/21  Yes McDonald, Rachelle Hora, DPM  ?omeprazole (PRILOSEC) 20 MG capsule Take 20 mg by mouth daily.   Yes [provider]  ?promethazine (PHENERGAN) 12.5 MG tablet Take 12.5 mg by mouth daily as needed for nausea or vomiting.   Yes [provider]  ?Propylene Glycol (SYSTANE BALANCE OP) Place 1 drop into both eyes 2 (two) times daily.   Yes [provider]  ?ramipril (ALTACE) 2.5 MG capsule Take 1 capsule (2.5 mg total) by mouth daily. 02/05/17  Yes Dyann Kief, PA-C  ?vitamin B-12 (CYANOCOBALAMIN) 500 MCG tablet Take 500 mcg by mouth daily.   Yes [provider]  ?azithromycin (ZITHROMAX Z-PAK) 250 MG tablet Take 1 tablet (250  mg total) by mouth daily. 07/07/21  Yes Achille Rich, PA-C  ?cefdinir (OMNICEF) 300 MG capsule Take 1 capsule (300 mg total) by mouth 2 (two) times daily. 07/07/21  Yes Achille Rich, PA-C  ?   ? ?Allergies    ?Sulfa antibiotics   ? ?Review of Systems   ?Review of Systems  ?Constitutional:  Negative for chills and fever.  ?HENT:  Positive for congestion and rhinorrhea. Negative for sore throat.   ?Respiratory:  Positive for cough and shortness of breath.   ?Cardiovascular:  Negative for chest pain.   ?Gastrointestinal:  Negative for abdominal pain, diarrhea, nausea and vomiting.  ?Genitourinary:  Negative for dysuria and hematuria.  ?Musculoskeletal:  Negative for myalgias.  ?Neurological:  Negative for headaches.  ? ?Physical Exam ?Updated Vital Signs ?BP (!) 119/105   Pulse (!) 103   Temp 98.5 ?F (36.9 ?C) (Oral)   Resp 16   Ht 5\' 6"  (1.676 m)   Wt 55.7 kg   SpO2 92%   BMI 19.82 kg/m?  ?Physical Exam ?Constitutional:   ?   Appearance: She is not toxic-appearing.  ?   Comments: Frail, cachectic appearing female  ?HENT:  ?   Mouth/Throat:  ?   Mouth: Mucous membranes are moist.  ?   Pharynx: No pharyngeal swelling or oropharyngeal exudate.  ?   Comments: Moist mucous membranes.  No pharyngeal erythema, edema, or exudate noted.  Uvula midline.  Airway patent. ?Cardiovascular:  ?   Rate and Rhythm: Normal rate. Rhythm irregular.  ?   Comments: Irregularly irregular rhythm.  Patient with known history of A-fib. ?Pulmonary:  ?   Effort: Pulmonary effort is normal. No respiratory distress.  ?   Breath sounds: Normal breath sounds. No decreased breath sounds or wheezing.  ?   Comments: Lungs are clear to auscultation bilaterally.  She speaks in full sentences with ease.  She is satting well on room air without any increased work of breathing.  No respiratory distress, accessory muscle use, tripoding, nasal flaring, or cyanosis present. ?Chest:  ?   Chest wall: No tenderness.  ?Abdominal:  ?   Palpations: Abdomen is soft.  ?   Tenderness: There is no guarding or rebound.  ?Musculoskeletal:  ?   Cervical back: Normal range of motion.  ?   Comments: Post-op shoe on left foot. The patient has multiple digit amputations and doesn't remember why. Looks chronic.  ?Lymphadenopathy:  ?   Cervical: No cervical adenopathy.  ?Neurological:  ?   Mental Status: She is alert.  ?   Comments: A&O x1  ? ? ?ED Results / Procedures / Treatments   ?Labs ?(all labs ordered are listed, but only abnormal results are displayed) ?Labs  Reviewed  ?CBC WITH DIFFERENTIAL/PLATELET - Abnormal; Notable for the following components:  ?    Result Value  ? RBC 3.40 (*)   ? Hemoglobin 9.1 (*)   ? HCT 30.2 (*)   ? RDW 17.1 (*)   ? All other components within normal limits  ?COMPREHENSIVE METABOLIC PANEL - Abnormal; Notable for the following components:  ? Potassium 3.4 (*)   ? BUN 25 (*)   ? Calcium 8.6 (*)   ? Albumin 3.3 (*)   ? All other components within normal limits  ?BRAIN NATRIURETIC PEPTIDE - Abnormal; Notable for the following components:  ? B Natriuretic Peptide 179.0 (*)   ? All other components within normal limits  ?PROCALCITONIN  ?PROCALCITONIN  ?TROPONIN I (HIGH SENSITIVITY)  ?TROPONIN I (HIGH  SENSITIVITY)  ? ? ?EKG ?EKG Interpretation ? ?Date/Time:  Friday July 07 2021 14:06:13 EDT ?Ventricular Rate:  91 ?PR Interval:    ?QRS Duration: 88 ?QT Interval:  369 ?QTC Calculation: 454 ?R Axis:   91 ?Text Interpretation: Atrial fibrillation Right axis deviation Consider left ventricular hypertrophy 12 Lead; Mason-Likar Confirmed by Meridee ScoreButler, Michael [phone]([id number]) on 07/07/2021 2:21:32 PM ? ?Radiology ?DG Chest Port 1 View ? ?Result Date: 07/07/2021 ?CLINICAL DATA:  Shortness of breath EXAM: PORTABLE CHEST 1 VIEW COMPARISON:  12/09/2019 FINDINGS: Transverse diameter of heart is increased. New patchy infiltrate is seen in the left lower lung fields. There is blunting of both lateral CP angles, more so on the left side. There is no pneumothorax. IMPRESSION: There is new patchy infiltrate in the left lower lung fields suggesting pneumonia in the left lower lobe. Small bilateral pleural effusions, more so on the left side. Electronically Signed   By: Ernie AvenaPalani  Rathinasamy M.D.   On: 07/07/2021 13:54   ? ?Procedures ?Procedures  ? ? ?Medications Ordered in ED ?Medications  ?molnupiravir EUA (LAGEVRIO) capsule 800 mg (has no administration in time range)  ?carvedilol (COREG) tablet 18.75 mg (has no administration in time range)  ?escitalopram (LEXAPRO) tablet 2.5 mg  (has no administration in time range)  ?mirtazapine (REMERON) tablet 15 mg (has no administration in time range)  ?apixaban (ELIQUIS) tablet 5 mg (has no administration in time range)  ?azithromycin (ZITHROMAX) tablet 500

## 2021-07-07 NOTE — Discharge Instructions (Signed)
**  She has been given her nightly Eliquis, carvedilol, and remeron** ? ?This patient was seen here today for her SOB symptoms in the setting of COVID. Her XRAY shows pneumonia. We have given her first dose of antibiotics here. I have printed out a prescription for her other antibiotics that will need to be filled. The prescription is attached to the paperwork. If she has any worsening SOB, chest pain, fever not controlled by Tylenol, vomiting, or abdominal pain, please return to the nearest ER.  ?

## 2021-07-07 NOTE — ED Triage Notes (Signed)
Pt arrive via [facility] from [facility]. Pt has no complaints aside from feeling mildly sob when ambulating and recently diagnosed with covid. Facility reports low O2 sats, but ems states they were 99% on RA when they arrived. Pt currently 98% on room air. Reports some generalized weakness as well.  ?

## 2021-07-07 NOTE — ED Notes (Signed)
This nurse called facility, spoke with Rodney Booze, informed that EMS is not transporting pt back to facility as they do not have any extra trucks to do so, facility is responsible for transporting their residents and they would need to provide this. Rodney Booze reports she will call [facility] and then call me back.  ?

## 2021-07-07 NOTE — ED Notes (Signed)
AC to bring Lagevrio from main pharmacy ?

## 2021-07-07 NOTE — ED Notes (Signed)
Spoke with [facility] Rodney Booze) to give pt update. Facility informed that pt will need to be transported via EMS back to facility ?

## 2021-07-08 NOTE — ED Notes (Signed)
Bedside commode placed at bedside.  

## 2021-07-08 NOTE — ED Notes (Signed)
Reggie R from [facility] here to pick up pt. Pt using this bathroom at this time. ?

## 2021-08-03 ENCOUNTER — Ambulatory Visit (INDEPENDENT_AMBULATORY_CARE_PROVIDER_SITE_OTHER): Payer: Medicare Other | Admitting: Podiatry

## 2021-08-03 DIAGNOSIS — Q6680 Congenital vertical talus deformity, unspecified foot: Secondary | ICD-10-CM

## 2021-08-03 DIAGNOSIS — Z872 Personal history of diseases of the skin and subcutaneous tissue: Secondary | ICD-10-CM

## 2021-08-03 NOTE — Progress Notes (Signed)
?  Subjective:  ?Patient ID: [name], female    DOB: 1940/09/15,  MRN: [id number] ? ?Chief Complaint  ?Patient presents with  ? Foot Ulcer  ?   WOUND CARE; LEFT FOOT  ? ? ?81 y.o. female presents with the above complaint. History confirmed with patient.  She is here with a care provider.  She lives in assisted nursing facility.  She is on Eliquis. ? ?Interval history: ?Overall doing well they have been using mupirocin ointment and shoe still. ? ?Objective:  ?Physical Exam: ?warm, good capillary refill, no trophic changes or ulcerative lesions, normal DP and PT pulses, and normal sensory exam. ?Left Foot: Prominent bone and plantar midfoot the ulcerations have completely healed..  She has severe rocker-bottom foot deformity with prominence of the likely cuboid and talonavicular joint on the plantar surface underlying the previous ulcer spot ? ? ? ? ?Assessment:  ? ?1. Rocker bottom foot deformity   ?2. History of foot ulcer   ? ? ? ?Plan:  ?Patient was evaluated and treated and all questions answered. ? ?Ulcer left foot ?-Overall doing very well and ulceration has completely healed.  I discussed the risk of recurrence.  I do think a supportive brace to offload her rocker-bottom foot deformity still beneficial and we are working on looking at precertification for this.  I think she can return to regular shoe gear at this point.  I will see her back in 2 months for reevaluation for possibility of recurrence.  Sooner if it recurs before then. ? ?   ? ? ?Return in about 2 months (around 10/03/2021) for wound check.  ? ?

## 2021-08-07 ENCOUNTER — Inpatient Hospital Stay (HOSPITAL_COMMUNITY): Payer: Medicare Other

## 2021-08-07 ENCOUNTER — Other Ambulatory Visit: Payer: Self-pay

## 2021-08-07 ENCOUNTER — Inpatient Hospital Stay (HOSPITAL_COMMUNITY)
Admission: EM | Admit: 2021-08-07 | Discharge: 2021-08-14 | DRG: 871 | Disposition: A | Discharge status: Skilled Nursing Facility | Payer: Medicare Other | Source: Skilled Nursing Facility | Attending: Internal Medicine | Admitting: Internal Medicine

## 2021-08-07 ENCOUNTER — Encounter (HOSPITAL_COMMUNITY): Payer: Self-pay

## 2021-08-07 ENCOUNTER — Emergency Department (HOSPITAL_COMMUNITY): Payer: Medicare Other

## 2021-08-07 DIAGNOSIS — I10 Essential (primary) hypertension: Secondary | ICD-10-CM | POA: Diagnosis present

## 2021-08-07 DIAGNOSIS — Z79899 Other long term (current) drug therapy: Secondary | ICD-10-CM | POA: Diagnosis not present

## 2021-08-07 DIAGNOSIS — I4891 Unspecified atrial fibrillation: Secondary | ICD-10-CM | POA: Diagnosis not present

## 2021-08-07 DIAGNOSIS — E876 Hypokalemia: Secondary | ICD-10-CM | POA: Diagnosis present

## 2021-08-07 DIAGNOSIS — E039 Hypothyroidism, unspecified: Secondary | ICD-10-CM | POA: Diagnosis present

## 2021-08-07 DIAGNOSIS — F0390 Unspecified dementia without behavioral disturbance: Secondary | ICD-10-CM | POA: Diagnosis not present

## 2021-08-07 DIAGNOSIS — D638 Anemia in other chronic diseases classified elsewhere: Secondary | ICD-10-CM

## 2021-08-07 DIAGNOSIS — I48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 atrial fibrillation: Secondary | ICD-10-CM | POA: Diagnosis not present

## 2021-08-07 DIAGNOSIS — H539 Unspecified visual disturbance: Secondary | ICD-10-CM | POA: Diagnosis present

## 2021-08-07 DIAGNOSIS — K219 Gastro-esophageal reflux disease without esophagitis: Secondary | ICD-10-CM | POA: Diagnosis present

## 2021-08-07 DIAGNOSIS — E785 Hyperlipidemia, unspecified: Secondary | ICD-10-CM | POA: Diagnosis present

## 2021-08-07 DIAGNOSIS — J13 Pneumonia due to Streptococcus pneumoniae: Secondary | ICD-10-CM | POA: Diagnosis present

## 2021-08-07 DIAGNOSIS — J9601 Acute respiratory failure with hypoxia: Secondary | ICD-10-CM | POA: Diagnosis not present

## 2021-08-07 DIAGNOSIS — L89152 Pressure ulcer of sacral region, stage 2: Secondary | ICD-10-CM | POA: Diagnosis not present

## 2021-08-07 DIAGNOSIS — R6521 Severe sepsis with septic shock: Secondary | ICD-10-CM | POA: Diagnosis not present

## 2021-08-07 DIAGNOSIS — I69398 Other sequelae of cerebral infarction: Secondary | ICD-10-CM

## 2021-08-07 DIAGNOSIS — A403 Sepsis due to Streptococcus pneumoniae: Principal | ICD-10-CM | POA: Diagnosis present

## 2021-08-07 DIAGNOSIS — I639 Cerebral infarction, unspecified: Secondary | ICD-10-CM | POA: Diagnosis present

## 2021-08-07 DIAGNOSIS — Z7901 Long term (current) use of anticoagulants: Secondary | ICD-10-CM | POA: Diagnosis not present

## 2021-08-07 DIAGNOSIS — Z882 Allergy status to sulfonamides status: Secondary | ICD-10-CM

## 2021-08-07 DIAGNOSIS — Z20822 Contact with and (suspected) exposure to covid-19: Secondary | ICD-10-CM | POA: Diagnosis not present

## 2021-08-07 DIAGNOSIS — Z7982 Long term (current) use of aspirin: Secondary | ICD-10-CM | POA: Diagnosis not present

## 2021-08-07 DIAGNOSIS — Z87891 Personal history of nicotine dependence: Secondary | ICD-10-CM

## 2021-08-07 DIAGNOSIS — J189 Pneumonia, unspecified organism: Secondary | ICD-10-CM | POA: Diagnosis not present

## 2021-08-07 DIAGNOSIS — R7881 Bacteremia: Secondary | ICD-10-CM | POA: Diagnosis not present

## 2021-08-07 DIAGNOSIS — A419 Sepsis, unspecified organism: Secondary | ICD-10-CM | POA: Diagnosis present

## 2021-08-07 DIAGNOSIS — B953 Streptococcus pneumoniae as the cause of diseases classified elsewhere: Secondary | ICD-10-CM | POA: Diagnosis not present

## 2021-08-07 DIAGNOSIS — G9341 Metabolic encephalopathy: Secondary | ICD-10-CM | POA: Diagnosis not present

## 2021-08-07 DIAGNOSIS — R6520 Severe sepsis without septic shock: Secondary | ICD-10-CM

## 2021-08-07 DIAGNOSIS — D649 Anemia, unspecified: Secondary | ICD-10-CM

## 2021-08-07 LAB — BLOOD GAS, VENOUS
Acid-Base Excess: 0.5 mmol/L (ref 0.0–2.0)
Bicarbonate: 26 mmol/L (ref 20.0–28.0)
Drawn by: 21310
FIO2: 75 %
O2 Saturation: 98.1 %
Patient temperature: 36.7
pCO2, Ven: 43 mmHg — ABNORMAL LOW (ref 44–60)
pH, Ven: 7.38 (ref 7.25–7.43)
pO2, Ven: 112 mmHg — ABNORMAL HIGH (ref 32–45)

## 2021-08-07 LAB — CBC WITH DIFFERENTIAL/PLATELET
Abs Immature Granulocytes: 0.14 10*3/uL — ABNORMAL HIGH (ref 0.00–0.07)
Basophils Absolute: 0 10*3/uL (ref 0.0–0.1)
Basophils Relative: 0 %
Eosinophils Absolute: 0 10*3/uL (ref 0.0–0.5)
Eosinophils Relative: 0 %
HCT: 25.9 % — ABNORMAL LOW (ref 36.0–46.0)
Hemoglobin: 8.2 g/dL — ABNORMAL LOW (ref 12.0–15.0)
Immature Granulocytes: 1 %
Lymphocytes Relative: 3 %
Lymphs Abs: 0.6 10*3/uL — ABNORMAL LOW (ref 0.7–4.0)
MCH: 25.9 pg — ABNORMAL LOW (ref 26.0–34.0)
MCHC: 31.7 g/dL (ref 30.0–36.0)
MCV: 82 fL (ref 80.0–100.0)
Monocytes Absolute: 0.3 10*3/uL (ref 0.1–1.0)
Monocytes Relative: 1 %
Neutro Abs: 22.8 10*3/uL — ABNORMAL HIGH (ref 1.7–7.7)
Neutrophils Relative %: 95 %
Platelets: 258 10*3/uL (ref 150–400)
RBC: 3.16 MIL/uL — ABNORMAL LOW (ref 3.87–5.11)
RDW: 17.1 % — ABNORMAL HIGH (ref 11.5–15.5)
WBC: 23.9 10*3/uL — ABNORMAL HIGH (ref 4.0–10.5)
nRBC: 0 % (ref 0.0–0.2)

## 2021-08-07 LAB — COMPREHENSIVE METABOLIC PANEL
ALT: 13 U/L (ref 0–44)
AST: 23 U/L (ref 15–41)
Albumin: 2.6 g/dL — ABNORMAL LOW (ref 3.5–5.0)
Alkaline Phosphatase: 102 U/L (ref 38–126)
Anion gap: 10 (ref 5–15)
BUN: 26 mg/dL — ABNORMAL HIGH (ref 8–23)
CO2: 25 mmol/L (ref 22–32)
Calcium: 7.9 mg/dL — ABNORMAL LOW (ref 8.9–10.3)
Chloride: 95 mmol/L — ABNORMAL LOW (ref 98–111)
Creatinine, Ser: 0.99 mg/dL (ref 0.44–1.00)
GFR, Estimated: 58 mL/min — ABNORMAL LOW (ref >60)
Glucose, Bld: 123 mg/dL — ABNORMAL HIGH (ref 70–99)
Potassium: 2.8 mmol/L — ABNORMAL LOW (ref 3.5–5.1)
Sodium: 130 mmol/L — ABNORMAL LOW (ref 135–145)
Total Bilirubin: 1.2 mg/dL (ref 0.3–1.2)
Total Protein: 6.4 g/dL — ABNORMAL LOW (ref 6.5–8.1)

## 2021-08-07 LAB — RESP PANEL BY RT-PCR (FLU A&B, COVID) ARPGX2
Influenza A by PCR: NEGATIVE
Influenza B by PCR: NEGATIVE
SARS Coronavirus 2 by RT PCR: NEGATIVE

## 2021-08-07 LAB — LACTIC ACID, PLASMA
Lactic Acid, Venous: 1.5 mmol/L (ref 0.5–1.9)
Lactic Acid, Venous: 2.6 mmol/L (ref 0.5–1.9)
Lactic Acid, Venous: 1.5 mmol/L (ref 0.5–1.9)

## 2021-08-07 LAB — MAGNESIUM: Magnesium: 1.1 mg/dL — ABNORMAL LOW (ref 1.7–2.4)

## 2021-08-07 MED ORDER — SODIUM CHLORIDE 0.9 % IV SOLN
2.0000 g | INTRAVENOUS | Status: DC
  Administered 2021-08-07: 2 g via INTRAVENOUS
  Filled 2021-08-07: qty 20

## 2021-08-07 MED ORDER — NOREPINEPHRINE 4 MG/250ML-% IV SOLN
2.0000 ug/min | INTRAVENOUS | Status: DC
  Administered 2021-08-07 – 2021-08-09 (×2): 2 ug/min via INTRAVENOUS
  Filled 2021-08-07 (×2): qty 250

## 2021-08-07 MED ORDER — LACTATED RINGERS IV BOLUS (SEPSIS)
1000.0000 mL | Freq: Once | INTRAVENOUS | Status: AC
  Administered 2021-08-07: 1000 mL via INTRAVENOUS

## 2021-08-07 MED ORDER — POTASSIUM CHLORIDE 10 MEQ/100ML IV SOLN
10.0000 meq | INTRAVENOUS | Status: AC
  Administered 2021-08-07 (×2): 10 meq via INTRAVENOUS
  Filled 2021-08-07 (×2): qty 100

## 2021-08-07 MED ORDER — LACTATED RINGERS IV BOLUS (SEPSIS)
500.0000 mL | Freq: Once | INTRAVENOUS | Status: AC
  Administered 2021-08-07: 500 mL via INTRAVENOUS

## 2021-08-07 MED ORDER — LACTATED RINGERS IV BOLUS (SEPSIS)
250.0000 mL | Freq: Once | INTRAVENOUS | Status: AC
  Administered 2021-08-07: 250 mL via INTRAVENOUS

## 2021-08-07 MED ORDER — LORAZEPAM 2 MG/ML IJ SOLN
0.5000 mg | Freq: Once | INTRAMUSCULAR | Status: AC
  Administered 2021-08-07: 0.5 mg via INTRAVENOUS
  Filled 2021-08-07: qty 1

## 2021-08-07 MED ORDER — SODIUM CHLORIDE 0.9 % IV SOLN
500.0000 mg | INTRAVENOUS | Status: DC
  Administered 2021-08-07: 500 mg via INTRAVENOUS
  Filled 2021-08-07: qty 5

## 2021-08-07 MED ORDER — SODIUM CHLORIDE 0.9 % IV SOLN
250.0000 mL | INTRAVENOUS | Status: DC
  Administered 2021-08-08: 250 mL via INTRAVENOUS

## 2021-08-07 MED ORDER — LACTATED RINGERS IV BOLUS
1000.0000 mL | Freq: Once | INTRAVENOUS | Status: AC
  Administered 2021-08-07: 1000 mL via INTRAVENOUS

## 2021-08-07 MED ORDER — LACTATED RINGERS IV SOLN
INTRAVENOUS | Status: DC
  Administered 2021-08-07: 1000 mL via INTRAVENOUS

## 2021-08-07 MED ORDER — VANCOMYCIN HCL 1250 MG/250ML IV SOLN
1250.0000 mg | Freq: Once | INTRAVENOUS | Status: AC
  Administered 2021-08-08: 1250 mg via INTRAVENOUS
  Filled 2021-08-07: qty 250

## 2021-08-07 MED ORDER — SODIUM CHLORIDE 0.9 % IV SOLN
2.0000 g | Freq: Once | INTRAVENOUS | Status: AC
  Administered 2021-08-08: 2 g via INTRAVENOUS
  Filled 2021-08-07: qty 12.5

## 2021-08-07 MED ORDER — MAGNESIUM SULFATE 2 GM/50ML IV SOLN
2.0000 g | Freq: Once | INTRAVENOUS | Status: AC
  Administered 2021-08-07: 2 g via INTRAVENOUS
  Filled 2021-08-07: qty 50

## 2021-08-07 NOTE — Assessment & Plan Note (Addendum)
Due to pneumococcal pneumonia with bacteremia.  ?-Required low dose levophed to maintain pressures despite IV fluid administration.  ?- Weaned off levophed am 08/09/21 ?-lactic acid peaked 2.8>>1.5 ?- continued ceftriaxone during hospitalization ?-sepsis physiology resolved ?

## 2021-08-07 NOTE — ED Notes (Signed)
Pt has increased RR,  o2 sat 95-97%.  Pt reports that she is uncomfortable and is having difficulty breathing.  Md informed and RT called.    ?

## 2021-08-07 NOTE — ED Triage Notes (Signed)
Patient arrives via [facility] from [facility]. Patient states she feels short  of breath. Facility reported that patient oxygen sat 70% on room air. EMS placed pt on O2 at 4l/min via [place] sat increase to 89% Patient currently denies chest pain. ?

## 2021-08-07 NOTE — Assessment & Plan Note (Addendum)
-  EKG shows atrial fibrillation.   ?-on Eliquis and carvedilol PTA ?-Hold carvedilol with hypotension initially ?-started IV metoprolol in lieu of coreg due to RVR>>d/c home with metoprolol 75 mg bid ?-restart apixaban ?-echo EF 50-55%, elevated PASP, mod TR ?-add diltiazem po>>increase to 60 mg q 6 hours>>change to cardizem CD 240  ?

## 2021-08-07 NOTE — Assessment & Plan Note (Addendum)
-  Continue ceftriaxone. MRSA negative ?-DC vancomycin. ?-urine streptococcus pneumoniae antigen positive ?-personally reviewed CXR--bilateral opacities ?

## 2021-08-07 NOTE — ED Provider Notes (Signed)
?[place] ?Provider Note ? ? ?CSN: [id number] ?Arrival date & time: 08/07/21  1704 ? ?  ? ?History ? ?Chief Complaint  ?Patient presents with  ? hypoxia  ? ? ?[name] is a 81 y.o. female. ? ?Patient brought in from [facility].  Patient has a history of dementia.  Brought in by EMS for shortness of breath.  Facility reported patient's oxygen sats were 70% on room air EMS put her on 4 L nasal cannula increased to about 89%.  Patient denies any pain.  But she is working hard to breathe respiratory rates upper in the 30s.  Heart rate around 114 blood pressure initially was 94/62.  One of our PAs did screening on her and started her on sepsis protocol. ?Past medical history significant for chest pain syndrome hypertension hypothyroidism hyperlipidemia stroke in 2018 visual abnormalities secondary to stroke history of atrial fibrillation gastroesophageal reflux disease.  Patient's had gallbladder removed.  Based on her medication list it appears she is on Eliquis.  No paperwork stating that she is a DNR. ? ? ? ?  ? ?Home Medications ?Prior to Admission medications   ?Medication Sig Start Date End Date Taking? Authorizing Provider  ?apixaban (ELIQUIS) 5 MG TABS tablet Take 5 mg by mouth 2 (two) times daily.    [provider]  ?aspirin EC 81 MG tablet Take 81 mg by mouth daily.    [provider]  ?atorvastatin (LIPITOR) 20 MG tablet Take 40 mg by mouth daily.    [provider]  ?azithromycin (ZITHROMAX Z-PAK) 250 MG tablet Take 1 tablet (250 mg total) by mouth daily. 07/07/21   Sherrell Puller, PA-C  ?calcium carbonate (TUMS - DOSED IN MG ELEMENTAL CALCIUM) 500 MG chewable tablet Chew 1 tablet by mouth 2 (two) times daily with a meal.    [provider]  ?carvedilol (COREG) 12.5 MG tablet GIVE 1.5 TABS (18.75MG ) BY MOUTH TWICE DAILY ?Patient taking differently: Take 12.5 mg by mouth See admin instructions. Take 1 and 1/2 tablet by mouth twice a day 12/05/17    Imogene Burn, PA-C  ?cefdinir (OMNICEF) 300 MG capsule Take 1 capsule (300 mg total) by mouth 2 (two) times daily. 07/07/21   Sherrell Puller, PA-C  ?cholecalciferol (VITAMIN D3) 25 MCG (1000 UNIT) tablet Take 1,000 Units by mouth daily.    [provider]  ?escitalopram (LEXAPRO) 5 MG tablet Take 2.5 mg by mouth at bedtime.    [provider]  ?ferrous sulfate 325 (65 FE) MG tablet Take 325 mg by mouth daily with breakfast.    [provider]  ?furosemide (LASIX) 20 MG tablet Take 20 mg by mouth daily.    [provider]  ?Levothyroxine Sodium 75 MCG CAPS Take 75 mcg by mouth daily before breakfast.    [provider]  ?Menthol, Topical Analgesic, (BIOFREEZE) 4 % GEL Apply 1 application topically See admin instructions. Apply to right shoulder twice a day    [provider]  ?mirtazapine (REMERON) 15 MG tablet Take 15 mg by mouth at bedtime.    [provider]  ?mupirocin ointment (BACTROBAN) 2 % Apply 1 application topically 2 (two) times daily. 04/20/21   Criselda Peaches, DPM  ?omeprazole (PRILOSEC) 20 MG capsule Take 20 mg by mouth daily.    [provider]  ?promethazine (PHENERGAN) 12.5 MG tablet Take 12.5 mg by mouth daily as needed for nausea or vomiting.    [provider]  ?Propylene Glycol (SYSTANE  BALANCE OP) Place 1 drop into both eyes 2 (two) times daily.    [provider]  ?ramipril (ALTACE) 2.5 MG capsule Take 1 capsule (2.5 mg total) by mouth daily. 02/05/17   Imogene Burn, PA-C  ?vitamin B-12 (CYANOCOBALAMIN) 500 MCG tablet Take 500 mcg by mouth daily.    [provider]  ?   ? ?Allergies    ?Sulfa antibiotics   ? ?Review of Systems   ?Review of Systems  ?Unable to perform ROS: Dementia  ? ?Physical Exam ?Updated Vital Signs ?BP 104/77   Pulse (!) 102   Temp 98.7 ?F (37.1 ?C) (Oral)   Resp (!) 29   Ht 1.676 m (5\' 6" )   Wt 55 kg   SpO2 95%   BMI 19.57 kg/m?  ?Physical Exam ?Vitals and  nursing note reviewed.  ?Constitutional:   ?   General: She is in acute distress.  ?   Appearance: She is well-developed. She is ill-appearing.  ?HENT:  ?   Head: Normocephalic and atraumatic.  ?Eyes:  ?   Conjunctiva/sclera: Conjunctivae normal.  ?Cardiovascular:  ?   Rate and Rhythm: Normal rate and regular rhythm.  ?   Heart sounds: No murmur heard. ?Pulmonary:  ?   Effort: Respiratory distress present.  ?   Breath sounds: Rhonchi present. No wheezing or rales.  ?Abdominal:  ?   General: There is no distension.  ?   Palpations: Abdomen is soft.  ?   Tenderness: There is no abdominal tenderness.  ?Musculoskeletal:     ?   General: No swelling.  ?   Cervical back: Normal range of motion and neck supple. No rigidity.  ?   Right lower leg: No edema.  ?   Left lower leg: No edema.  ?Skin: ?   General: Skin is warm and dry.  ?   Capillary Refill: Capillary refill takes less than 2 seconds.  ?Neurological:  ?   Mental Status: She is alert. Mental status is at baseline.  ?Psychiatric:     ?   Mood and Affect: Mood normal.  ? ? ?ED Results / Procedures / Treatments   ?Labs ?(all labs ordered are listed, but only abnormal results are displayed) ?Labs Reviewed  ?CBC WITH DIFFERENTIAL/PLATELET - Abnormal; Notable for the following components:  ?    Result Value  ? WBC 23.9 (*)   ? RBC 3.16 (*)   ? Hemoglobin 8.2 (*)   ? HCT 25.9 (*)   ? MCH 25.9 (*)   ? RDW 17.1 (*)   ? Neutro Abs 22.8 (*)   ? Lymphs Abs 0.6 (*)   ? Abs Immature Granulocytes 0.14 (*)   ? All other components within normal limits  ?COMPREHENSIVE METABOLIC PANEL - Abnormal; Notable for the following components:  ? Sodium 130 (*)   ? Potassium 2.8 (*)   ? Chloride 95 (*)   ? Glucose, Bld 123 (*)   ? BUN 26 (*)   ? Calcium 7.9 (*)   ? Total Protein 6.4 (*)   ? Albumin 2.6 (*)   ? GFR, Estimated 58 (*)   ? All other components within normal limits  ?RESP PANEL BY RT-PCR (FLU A&B, COVID) [id number]  ?CULTURE, BLOOD (ROUTINE X 2)  ?CULTURE, BLOOD (ROUTINE X 2)  ?LACTIC  ACID, PLASMA  ?LACTIC ACID, PLASMA  ? ? ?EKG ?EKG Interpretation ? ?Date/Time:  Monday August 07 2021 17:29:56 EDT ?Ventricular Rate:  117 ?PR Interval:    ?QRS Duration:  84 ?QT Interval:  306 ?QTC Calculation: 427 ?R Axis:   34 ?Text Interpretation: Atrial fibrillation Confirmed by Fredia Sorrow [phone]) on 08/07/2021 5:36:31 PM ? ?Radiology ?DG Chest 2 View ? ?Result Date: 08/07/2021 ?CLINICAL DATA:  Short of breath, atrial fibrillation, weakness EXAM: CHEST - 2 VIEW COMPARISON:  07/07/2021 FINDINGS: Frontal and lateral views of the chest demonstrate stable enlargement the cardiac silhouette. Since the prior exam, dense right upper lobe consolidation has developed. Increased density at the right hilum, new since prior study, may reflect adenopathy. There is progressive consolidation at the lung bases, with enlarging small bilateral pleural effusions. No pneumothorax. No acute bony abnormalities. IMPRESSION: 1. Progressive multifocal bilateral airspace disease, greatest in the right upper lobe. Findings most consistent with pneumonia. 2. Right hilar prominence may reflect reactive adenopathy. 3. Small bilateral pleural effusions, increased since prior exam. Electronically Signed   By: Randa Ngo M.D.   On: 08/07/2021 18:52  ? ?DG Chest Port 1 View ? ?Result Date: 08/07/2021 ?CLINICAL DATA:  Shortness of breath. EXAM: PORTABLE CHEST 1 VIEW COMPARISON:  Chest radiograph dated 08/07/2021. FINDINGS: Bilateral pulmonary opacities involving the right upper lobe and bibasilar lungs similar to earlier radiograph. Findings most consistent with multi lobar pneumonia. Underlying mass is not excluded clinical correlation and follow-up to resolution recommended. Probable small bilateral pleural effusions. No pneumothorax. Mild cardiomegaly. Atherosclerotic calcification of the aorta. Osteopenia with degenerative changes of the spine. No acute osseous pathology. IMPRESSION: No significant interval change. Electronically Signed    By: Anner Crete M.D.   On: 08/07/2021 21:20   ? ?Procedures ?Procedures  ? ? ?Medications Ordered in ED ?Medications  ?lactated ringers infusion (has no administration in time range)  ?cefTRIAXone (RO

## 2021-08-07 NOTE — H&P (Addendum)
?History and Physical  ? ? ?[name] TGP:[id number] DOB: [birth year] DOA: 08/07/2021 ? ?PCP: Default, Provider, MD  ? ?Patient coming from: [facility] ? ?I have personally briefly reviewed patient's old medical records in [facility] ? ?Chief Complaint: Difficulty breathing, hypoxia ? ?HPI: [name] is a 81 y.o. female with medical history significant for hypertension, atrial fibrillation, CVA. ?Patient was brought to the ED from nursing home with reports of difficulty breathing, O2 sats with 70% on room air, per EMS she was placed on 4 L O2 sat improved to 89%.  On my evaluation patient is awake and alert, agitated, speech is confused. ?I talked to patient's son who is power of attorney-[name].  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  If his mother's breathing gets worse, he would like her to be intubated.  She is a full code. ? ?Patient was in the ED a month ago 3/17-COVID test was positive, chest x-ray showed pneumonia.  She was not septic and not hypoxic.  She was discharged to complete 5 days of cefdinir and azithromycin. ? ?ED Course: Temperature 98.7.  Heart rate 102-114.  Respiratory rate 20-36.  Blood pressure systolic initially in the low 309M now dropping to 83 systolic..  O2 sats had dropped to 89% on 3 L.  She was placed on BiPAP. ?WBC 23.9.  Potassium 2.8.  Lactic acid 1.5 > 2.6. ?VBG showed pH of 7.3, PCO2 of 43. ?0.5 mg of Ativan given without improvement in agitation. ? ?2.7 L of fluid given.  IV ceftriaxone and azithromycin started. ?Hospitalist to admit. ? ?Review of Systems: Unable to ascertain due to baseline dementia.. ? ?Past Medical History:  ?Diagnosis Date  ? Anemia   ? Atrial fibrillation (HCC)   ? Chest pain syndrome   ? GERD (gastroesophageal reflux disease)   ?  Hyperlipidemia   ? Hypertension   ? Hypothyroidism   ? Stroke [facility]) 08/2016  ? 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 Vision abnormalities   ? as result of stroke  ? Xerophthalmia   ? ? ?Past Surgical History:  ?Procedure Laterality Date  ? CHOLECYSTECTOMY    ? leg surgery    ? TONSILLECTOMY    ? ? ? reports that she has quit smoking. She has never used smokeless tobacco. She reports that she does not drink alcohol and does not use drugs. ? ?Allergies  ?Allergen Reactions  ? Sulfa Antibiotics Other (See Comments)  ?  Don't remember reaction  ? ? ?Family History  ?Family history unknown: Yes  ? ? ?Prior to Admission medications   ?Medication Sig Start Date End Date Taking? Authorizing Provider  ?apixaban (ELIQUIS) 5 MG TABS tablet Take 5 mg by mouth 2 (two) times daily.    [provider]  ?aspirin EC 81 MG tablet Take 81 mg by mouth daily.    [provider]  ?atorvastatin (LIPITOR) 20 MG tablet Take 40 mg by mouth daily.    [provider]  ?azithromycin (ZITHROMAX Z-PAK) 250 MG tablet Take 1 tablet (250 mg total) by mouth daily. 07/07/21   Achille Rich, PA-C  ?calcium carbonate (TUMS - DOSED IN MG ELEMENTAL CALCIUM) 500 MG chewable tablet Chew 1 tablet by mouth 2 (two) times daily  with a meal.    [provider]  ?carvedilol (COREG) 12.5 MG tablet GIVE 1.5 TABS (18.75MG ) BY MOUTH TWICE DAILY ?Patient taking differently: Take 12.5 mg by mouth See admin instructions. Take 1 and 1/2 tablet by mouth twice a day 12/05/17   Imogene Burn, PA-C  ?cefdinir (OMNICEF) 300 MG capsule Take 1 capsule (300 mg total) by mouth 2 (two) times daily. 07/07/21   Sherrell Puller, PA-C  ?cholecalciferol (VITAMIN D3) 25 MCG (1000 UNIT) tablet Take 1,000 Units by mouth daily.    [provider]  ?escitalopram (LEXAPRO) 5 MG tablet Take 2.5 mg by mouth at bedtime.    [provider]  ?ferrous sulfate 325 (65 FE) MG tablet Take 325 mg by mouth daily with  breakfast.    [provider]  ?furosemide (LASIX) 20 MG tablet Take 20 mg by mouth daily.    [provider]  ?Levothyroxine Sodium 75 MCG CAPS Take 75 mcg by mouth daily before breakfast.    [provider]  ?Menthol, Topical Analgesic, (BIOFREEZE) 4 % GEL Apply 1 application topically See admin instructions. Apply to right shoulder twice a day    [provider]  ?mirtazapine (REMERON) 15 MG tablet Take 15 mg by mouth at bedtime.    [provider]  ?mupirocin ointment (BACTROBAN) 2 % Apply 1 application topically 2 (two) times daily. 04/20/21   Criselda Peaches, DPM  ?omeprazole (PRILOSEC) 20 MG capsule Take 20 mg by mouth daily.    [provider]  ?promethazine (PHENERGAN) 12.5 MG tablet Take 12.5 mg by mouth daily as needed for nausea or vomiting.    [provider]  ?Propylene Glycol (SYSTANE BALANCE OP) Place 1 drop into both eyes 2 (two) times daily.    [provider]  ?ramipril (ALTACE) 2.5 MG capsule Take 1 capsule (2.5 mg total) by mouth daily. 02/05/17   Imogene Burn, PA-C  ?vitamin B-12 (CYANOCOBALAMIN) 500 MCG tablet Take 500 mcg by mouth daily.    [provider]  ? ? ?Physical Exam: Exam limited by altered mental status ?Vitals:  ? 08/07/21 1900 08/07/21 1930 08/07/21 2030 08/07/21 2049  ?BP: 107/63 105/60 104/77   ?Pulse: 70 (!) 110  (!) 102  ?Resp: (!) 29 (!) 36 (!) 30 (!) 29  ?Temp:      ?TempSrc:      ?SpO2: (!) 80% 93% 97% 95%  ?Weight:      ?Height:      ? ? ?Constitutional: On BiPAP, agitated  ?Vitals:  ? 08/07/21 1900 08/07/21 1930 08/07/21 2030 08/07/21 2049  ?BP: 107/63 105/60 104/77   ?Pulse: 70 (!) 110  (!) 102  ?Resp: (!) 29 (!) 36 (!) 30 (!) 29  ?Temp:      ?TempSrc:      ?SpO2: (!) 80% 93% 97% 95%  ?Weight:      ?Height:      ? ?Eyes: PERRL, lids and conjunctivae normal ?ENMT: Mucous membranes are moist.  ?Neck: normal, supple, no masses, no thyromegaly ?Respiratory: On BiPAP, agitated, equal air  entry, ?Cardiovascular: Tachycardic, regular rate and rhythm, no murmurs / rubs / gallops.  Trace bilateral pitting extremity edema.   ?Abdomen: no tenderness, no masses palpated. No hepatosplenomegaly. Bowel sounds positive.  ?Musculoskeletal: no clubbing / cyanosis. No joint deformity upper and lower extremities.  ?Skin: no rashes, lesions, ulcers. No induration ?Neurologic: Limited exam, moving all extremities.  ?Psychiatric: Agitated, confused.  Not able to answer questions. ? ?Labs on  Admission: I have personally reviewed following labs and imaging studies ? ?CBC: ?Recent Labs  ?Lab 08/07/21 ?1759  ?WBC 23.9*  ?NEUTROABS 22.8*  ?HGB 8.2*  ?HCT 25.9*  ?MCV 82.0  ?PLT 258  ? ?Basic Metabolic Panel: ?Recent Labs  ?Lab 08/07/21 ?1759  ?NA 130*  ?K 2.8*  ?CL 95*  ?CO2 25  ?GLUCOSE 123*  ?BUN 26*  ?CREATININE 0.99  ?CALCIUM 7.9*  ? ?GFR: ?Estimated Creatinine Clearance: 40 mL/min (by C-G formula based on SCr of 0.99 mg/dL). ?Liver Function Tests: ?Recent Labs  ?Lab 08/07/21 ?1759  ?AST 23  ?ALT 13  ?ALKPHOS 102  ?BILITOT 1.2  ?PROT 6.4*  ?ALBUMIN 2.6*  ? ? ?Radiological Exams on Admission: ?DG Chest 2 View ? ?Result Date: 08/07/2021 ?CLINICAL DATA:  Short of breath, atrial fibrillation, weakness EXAM: CHEST - 2 VIEW COMPARISON:  07/07/2021 FINDINGS: Frontal and lateral views of the chest demonstrate stable enlargement the cardiac silhouette. Since the prior exam, dense right upper lobe consolidation has developed. Increased density at the right hilum, new since prior study, may reflect adenopathy. There is progressive consolidation at the lung bases, with enlarging small bilateral pleural effusions. No pneumothorax. No acute bony abnormalities. IMPRESSION: 1. Progressive multifocal bilateral airspace disease, greatest in the right upper lobe. Findings most consistent with pneumonia. 2. Right hilar prominence may reflect reactive adenopathy. 3. Small bilateral pleural effusions, increased since prior exam.  Electronically Signed   By: Randa Ngo M.D.   On: 08/07/2021 18:52  ? ?DG Chest Port 1 View ? ?Result Date: 08/07/2021 ?CLINICAL DATA:  Shortness of breath. EXAM: PORTABLE CHEST 1 VIEW COMPARISON:  Chest radiograph dated 0

## 2021-08-07 NOTE — Assessment & Plan Note (Addendum)
-   supplemented ?Mag 1.8 ?

## 2021-08-07 NOTE — Assessment & Plan Note (Addendum)
secondary to multifocal pneumonia and severe sepsis.  ?-overall improved significantly from admission.  ?-now able to carry conversation and follow commands ?-son states pt is near baseline ?

## 2021-08-08 DIAGNOSIS — J13 Pneumonia due to Streptococcus pneumoniae: Secondary | ICD-10-CM

## 2021-08-08 DIAGNOSIS — G9341 Metabolic encephalopathy: Secondary | ICD-10-CM | POA: Diagnosis not present

## 2021-08-08 DIAGNOSIS — B953 Streptococcus pneumoniae as the cause of diseases classified elsewhere: Secondary | ICD-10-CM

## 2021-08-08 DIAGNOSIS — D649 Anemia, unspecified: Secondary | ICD-10-CM

## 2021-08-08 DIAGNOSIS — J9601 Acute respiratory failure with hypoxia: Secondary | ICD-10-CM | POA: Diagnosis not present

## 2021-08-08 DIAGNOSIS — R7881 Bacteremia: Secondary | ICD-10-CM

## 2021-08-08 DIAGNOSIS — I10 Essential (primary) hypertension: Secondary | ICD-10-CM

## 2021-08-08 DIAGNOSIS — A419 Sepsis, unspecified organism: Secondary | ICD-10-CM | POA: Diagnosis not present

## 2021-08-08 DIAGNOSIS — R6521 Severe sepsis with septic shock: Secondary | ICD-10-CM

## 2021-08-08 DIAGNOSIS — D638 Anemia in other chronic diseases classified elsewhere: Secondary | ICD-10-CM

## 2021-08-08 LAB — BLOOD CULTURE ID PANEL (REFLEXED) - BCID2

## 2021-08-08 LAB — URINALYSIS, ROUTINE W REFLEX MICROSCOPIC
Bilirubin Urine: NEGATIVE
Glucose, UA: NEGATIVE mg/dL
Hgb urine dipstick: NEGATIVE
Ketones, ur: NEGATIVE mg/dL
Leukocytes,Ua: NEGATIVE
Nitrite: NEGATIVE
Protein, ur: 30 mg/dL — AB
Specific Gravity, Urine: 1.015 (ref 1.005–1.030)
pH: 5 (ref 5.0–8.0)

## 2021-08-08 LAB — IRON AND TIBC
Iron: 9 ug/dL — ABNORMAL LOW (ref 28–170)
Saturation Ratios: 7 % — ABNORMAL LOW (ref 10.4–31.8)
TIBC: 136 ug/dL — ABNORMAL LOW (ref 250–450)
UIBC: 127 ug/dL

## 2021-08-08 LAB — BASIC METABOLIC PANEL
Anion gap: 9 (ref 5–15)
BUN: 25 mg/dL — ABNORMAL HIGH (ref 8–23)
CO2: 24 mmol/L (ref 22–32)
Calcium: 7.7 mg/dL — ABNORMAL LOW (ref 8.9–10.3)
Chloride: 98 mmol/L (ref 98–111)
Creatinine, Ser: 0.95 mg/dL (ref 0.44–1.00)
GFR, Estimated: >60 mL/min (ref >60)
Glucose, Bld: 127 mg/dL — ABNORMAL HIGH (ref 70–99)
Potassium: 3.7 mmol/L (ref 3.5–5.1)
Sodium: 131 mmol/L — ABNORMAL LOW (ref 135–145)

## 2021-08-08 LAB — TYPE AND SCREEN
ABO/RH(D): A POS
Antibody Screen: NEGATIVE

## 2021-08-08 LAB — FERRITIN: Ferritin: 296 ng/mL (ref 11–307)

## 2021-08-08 LAB — CBC
HCT: 23.4 % — ABNORMAL LOW (ref 36.0–46.0)
Hemoglobin: 7.5 g/dL — ABNORMAL LOW (ref 12.0–15.0)
MCH: 26.4 pg (ref 26.0–34.0)
MCHC: 32.1 g/dL (ref 30.0–36.0)
MCV: 82.4 fL (ref 80.0–100.0)
Platelets: 234 10*3/uL (ref 150–400)
RBC: 2.84 MIL/uL — ABNORMAL LOW (ref 3.87–5.11)
RDW: 17.2 % — ABNORMAL HIGH (ref 11.5–15.5)
WBC: 25.6 10*3/uL — ABNORMAL HIGH (ref 4.0–10.5)
nRBC: 0 % (ref 0.0–0.2)

## 2021-08-08 LAB — RETICULOCYTES
Immature Retic Fract: 20.1 % — ABNORMAL HIGH (ref 2.3–15.9)
RBC.: 3.14 MIL/uL — ABNORMAL LOW (ref 3.87–5.11)
Retic Count, Absolute: 29.2 10*3/uL (ref 19.0–186.0)
Retic Ct Pct: 0.9 % (ref 0.4–3.1)

## 2021-08-08 LAB — HEMOGLOBIN AND HEMATOCRIT, BLOOD
HCT: 26 % — ABNORMAL LOW (ref 36.0–46.0)
Hemoglobin: 8.1 g/dL — ABNORMAL LOW (ref 12.0–15.0)

## 2021-08-08 LAB — ABO/RH: ABO/RH(D): A POS

## 2021-08-08 LAB — VITAMIN B12: Vitamin B-12: 2062 pg/mL — ABNORMAL HIGH (ref 180–914)

## 2021-08-08 LAB — MRSA NEXT GEN BY PCR, NASAL: MRSA by PCR Next Gen: NOT DETECTED

## 2021-08-08 LAB — MAGNESIUM: Magnesium: 1.6 mg/dL — ABNORMAL LOW (ref 1.7–2.4)

## 2021-08-08 LAB — STREP PNEUMONIAE URINARY ANTIGEN: Strep Pneumo Urinary Antigen: POSITIVE — AB

## 2021-08-08 LAB — FOLATE: Folate: 9.9 ng/mL (ref >5.9)

## 2021-08-08 MED ORDER — ACETAMINOPHEN 650 MG RE SUPP: 650.0000 mg | Freq: Four times a day (QID) | RECTAL | Status: DC | PRN

## 2021-08-08 MED ORDER — ACETAMINOPHEN 325 MG PO TABS: 650.0000 mg | ORAL_TABLET | Freq: Four times a day (QID) | ORAL | Status: DC | PRN

## 2021-08-08 MED ORDER — SODIUM CHLORIDE 0.9 % IV SOLN: 2.0000 g | Freq: Two times a day (BID) | INTRAVENOUS | Status: DC

## 2021-08-08 MED ORDER — POTASSIUM CHLORIDE 10 MEQ/100ML IV SOLN
10.0000 meq | INTRAVENOUS | Status: AC
  Administered 2021-08-08 (×2): 10 meq via INTRAVENOUS
  Filled 2021-08-08 (×2): qty 100

## 2021-08-08 MED ORDER — ENOXAPARIN SODIUM 40 MG/0.4ML IJ SOSY
40.0000 mg | PREFILLED_SYRINGE | INTRAMUSCULAR | Status: DC
  Administered 2021-08-08 – 2021-08-09 (×2): 40 mg via SUBCUTANEOUS
  Filled 2021-08-08 (×2): qty 0.4

## 2021-08-08 MED ORDER — ONDANSETRON HCL 4 MG PO TABS: 4.0000 mg | ORAL_TABLET | Freq: Four times a day (QID) | ORAL | Status: DC | PRN

## 2021-08-08 MED ORDER — CHLORHEXIDINE GLUCONATE CLOTH 2 % EX PADS
6.0000 | MEDICATED_PAD | Freq: Every day | CUTANEOUS | Status: DC
  Administered 2021-08-08 – 2021-08-14 (×6): 6 via TOPICAL

## 2021-08-08 MED ORDER — POLYETHYLENE GLYCOL 3350 17 G PO PACK: 17.0000 g | PACK | Freq: Every day | ORAL | Status: DC | PRN

## 2021-08-08 MED ORDER — VANCOMYCIN HCL 750 MG/150ML IV SOLN
750.0000 mg | INTRAVENOUS | Status: DC
  Filled 2021-08-08: qty 150

## 2021-08-08 MED ORDER — POTASSIUM CHLORIDE IN NACL 20-0.9 MEQ/L-% IV SOLN
INTRAVENOUS | Status: AC
  Filled 2021-08-08: qty 1000

## 2021-08-08 MED ORDER — MAGNESIUM SULFATE 2 GM/50ML IV SOLN
2.0000 g | Freq: Once | INTRAVENOUS | Status: AC
Start: 2021-08-08 — End: 2021-08-08
  Administered 2021-08-08: 2 g via INTRAVENOUS
  Filled 2021-08-08: qty 50

## 2021-08-08 MED ORDER — ONDANSETRON HCL 4 MG/2ML IJ SOLN: 4.0000 mg | Freq: Four times a day (QID) | INTRAMUSCULAR | Status: DC | PRN

## 2021-08-08 MED ORDER — SODIUM CHLORIDE 0.9 % IV SOLN
2.0000 g | INTRAVENOUS | Status: DC
  Administered 2021-08-08 – 2021-08-13 (×6): 2 g via INTRAVENOUS
  Filled 2021-08-08 (×6): qty 20

## 2021-08-08 NOTE — ED Notes (Signed)
Pt checked, Pt dry, [place] in place ?

## 2021-08-08 NOTE — Assessment & Plan Note (Addendum)
No bleeding noted.  ?- Recheck H/H with anemia panel, T&S with intention to transfuse if remains hypotensive with hgb < 8 or if hgb < 7. ?-iron saturation 7%, ferritin 296 ?-B12 2062 ?-folate 9.9 ?-restarted ferrous sulfate ?

## 2021-08-08 NOTE — Progress Notes (Signed)
RT took patient off of BIPAP and placed on 4L nasal cannula. Patient is tolerating well at this time. BIPAP remains at bedside on standby. Will monitor as needed. ?

## 2021-08-08 NOTE — Progress Notes (Signed)
?Progress Note ? ?Patient: [name] [id number] DOB: [birth year]  ?DOA: 08/07/2021  DOS: 08/08/2021  ?  ?Brief hospital course: ?[name] is a 81 y.o. female with medical history significant for hypertension, atrial fibrillation, CVA. ?Patient was brought to the ED from nursing home with reports of difficulty breathing, O2 sats with 70% on room air, per EMS she was placed on 4 L O2 sat improved to 89%.  On my evaluation patient is awake and alert, agitated, speech is confused. ?I talked to patient's son who is power of attorney-[name].  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  If his mother's breathing gets worse, he would like her to be intubated.  She is a full code. ?  ?Patient was in the ED a month ago 3/17-COVID test was positive, chest x-ray showed pneumonia.  She was not septic and not hypoxic.  She was discharged to complete 5 days of cefdinir and azithromycin. ?  ?ED Course: Temperature 98.7.  Heart rate 102-114.  Respiratory rate 20-36.  Blood pressure systolic initially in the low 100s now dropping to 83 systolic..  O2 sats had dropped to 89% on 3 L.  She was placed on BiPAP. ?WBC 23.9.  Potassium 2.8.  Lactic acid 1.5 > 2.6. ?VBG showed pH of 7.3, PCO2 of 43. ?0.5 mg of Ativan given without improvement in agitation. ?  ?2.7 L of fluid given.  IV ceftriaxone and azithromycin started. ?Hospitalist to admit. ? ?Hospital Course: Patient's blood cultures have grown S. pneumoniae, so antibiotics narrowed to ceftriaxone. Respiratory status has improved, weaned off BiPAP to nasal cannula.  ? ?Assessment and Plan: ?* Pneumococcal pneumonia ([place]) ?- Continue ceftriaxone. MRSA negative, can DC vancomycin. ? ?Septic shock ([place]) ?Due to pneumococcal pneumonia with bacteremia. Requiring low dose  levophed to maintain pressures despite IV fluid administration.  ?- Wean pressors as able. Lactic acid improving.  ?- Abx ? ?Acute respiratory failure with hypoxia ([place]) ?- Transition off BiPAP since mentation improved and oxygenation maintained well with decreased FiO2. ? ?Acute metabolic encephalopathy ?Likely secondary to multifocal pneumonia and severe sepsis. This has overall improved significantly from admission.  ?- Treat infection and monitor closely. Now that more coherent, if stable off BiPAP can start diet. ? ?Anemia ?Unclear etiology, though iron and B12 supplements are on home med list. Does have mildly low hgb at available baseline. No bleeding noted.  ?- Recheck H/H with anemia panel, T&S with intention to transfuse if remains hypotensive with hgb < 8 or if hgb < 7. ? ?Pneumococcal bacteremia ?- Repeat Cx's in AM to confirm clearance. Would need echo if persistently positive.  ?- CTX 2g ? ?Hypokalemia ?- Resolved with supplementation ?- Hypomagnesemia persists, will repeat supplement. ? ?Persistent atrial fibrillation ([place]) ?EKG shows atrial fibrillation.  She is on Eliquis and carvedilol. ?-Hold carvedilol with hypotension ?-Hold Eliquis for now, n.p.o. status resume with improvement in mental status. ? ? ?Pressure Injury 08/08/21 Sacrum Anterior Stage 2 -  Partial thickness loss of dermis presenting as a shallow open injury with a red, pink wound bed without slough. Redness (Active)  ?08/08/21 0845  ?Location: Sacrum  ?Location Orientation: Anterior  ?Staging: Stage 2 -  Partial thickness loss of dermis presenting as a shallow open injury with a red, pink wound bed without  slough.  ?Wound Description (Comments): Redness  ?Present on Admission: Yes  ?Dressing Type Foam - Lift dressing to assess site every shift 08/08/21 1150  ? ?Subjective: Pt feeling less weak, would like to come off BiPAP this AM. Shortness of breath improved. Still on levo. No chest pain. ? ?Objective: ?Vitals:  ? 08/08/21 1515  08/08/21 1530 08/08/21 1545 08/08/21 1546  ?BP: 117/76 (!) 81/42 112/65   ?Pulse:  (!) 116 82 (!) 112  ?Resp: (!) 24 (!) 26 (!) 27 (!) 24  ?Temp:    (!) 97.2 ?F (36.2 ?C)  ?TempSrc:    Oral  ?SpO2:  96% 95%   ?Weight:      ?Height:      ? ?Gen: 81 y.o. female in no distress ?Pulm: Nonlabored with BiPAP, pretty distinct right sided diminished sounds without crackles or wheezes. ?CV: Regular rate and rhythm. No murmur, rub, or gallop. No JVD, trace dependent edema. ?GI: Abdomen soft, non-tender, non-distended, with normoactive bowel sounds.  ?Ext: Warm, no deformities ?Skin: No rashes, lesions or ulcers on visualized skin. ?Neuro: Alert and incompletely oriented, no meningismus and no focal neurological deficits on limited exam. ?Psych: Euthymic, calm. ? ?Data Personally reviewed: ?CBC: ?Recent Labs  ?Lab 08/07/21 ?1759 08/08/21 ?0410  ?WBC 23.9* 25.6*  ?NEUTROABS 22.8*  --   ?HGB 8.2* 7.5*  ?HCT 25.9* 23.4*  ?MCV 82.0 82.4  ?PLT 258 234  ? ?Basic Metabolic Panel: ?Recent Labs  ?Lab 08/07/21 ?1759 08/08/21 ?0410  ?NA 130* 131*  ?K 2.8* 3.7  ?CL 95* 98  ?CO2 25 24  ?GLUCOSE 123* 127*  ?BUN 26* 25*  ?CREATININE 0.99 0.95  ?CALCIUM 7.9* 7.7*  ?MG 1.1* 1.6*  ? ?GFR: ?Estimated Creatinine Clearance: 39.7 mL/min (by C-G formula based on SCr of 0.95 mg/dL). ?Liver Function Tests: ?Recent Labs  ?Lab 08/07/21 ?1759  ?AST 23  ?ALT 13  ?ALKPHOS 102  ?BILITOT 1.2  ?PROT 6.4*  ?ALBUMIN 2.6*  ? ? ?Urine analysis: ?   ?Component Value Date/Time  ? [place] YELLOW 08/08/2021 0118  ? APPEARANCEUR CLEAR 08/08/2021 0118  ? LABSPEC 1.015 08/08/2021 0118  ? PHURINE 5.0 08/08/2021 0118  ? GLUCOSEU NEGATIVE 08/08/2021 0118  ? [place] NEGATIVE 08/08/2021 0118  ? [place] NEGATIVE 08/08/2021 0118  ? [place] NEGATIVE 08/08/2021 0118  ? PROTEINUR 30 (A) 08/08/2021 0118  ? NITRITE NEGATIVE 08/08/2021 0118  ? LEUKOCYTESUR NEGATIVE 08/08/2021 0118  ? ?Recent Results (from the past 240 hour(s))  ?Resp Panel by RT-PCR (Flu A&B, Covid)  Nasopharyngeal Swab     Status: None  ? Collection Time: 08/07/21  5:50 PM  ? Specimen: Nasopharyngeal Swab; Nasopharyngeal(NP) swabs in vial transport medium  ?Result Value Ref Range Status  ? SARS Coronavirus 2 by RT PCR NEGATIVE NEGATIVE Final  ?  Comment: (NOTE) ?SARS-CoV-2 target nucleic acids are NOT DETECTED. ? ?The SARS-CoV-2 RNA is generally detectable in upper respiratory ?specimens during the acute phase of infection. The lowest ?concentration of SARS-CoV-2 viral copies this assay can detect is ?138 copies/mL. A negative result does not preclude SARS-Cov-2 ?infection and should not be used as the sole basis for treatment or ?other patient management decisions. A negative result may occur with  ?improper specimen collection/handling, submission of specimen other ?than nasopharyngeal swab, presence of viral mutation(s) within the ?areas targeted by this assay, and inadequate number of viral ?copies(<138 copies/mL). A negative result must be combined with ?clinical observations, patient history, and epidemiological ?information. The expected result is Negative. ? ?Fact Sheet for Patients:  ?  EntrepreneurPulse.com.au ? ?Fact Sheet for Healthcare Providers:  ?IncredibleEmployment.be ? ?This test is no t yet approved or cleared by the [place] FDA and  ?has been authorized for detection and/or diagnosis of SARS-CoV-2 by ?FDA under an Emergency Use Authorization (EUA). This EUA will remain  ?in effect (meaning this test can be used) for the duration of the ?COVID-19 declaration under Section 564(b)(1) of the Act, 21 ?U.S.C.section 360bbb-3(b)(1), unless the authorization is terminated  ?or revoked sooner.  ? ? ?  ? Influenza A by PCR NEGATIVE NEGATIVE Final  ? Influenza B by PCR NEGATIVE NEGATIVE Final  ?  Comment: (NOTE) ?The Xpert Xpress SARS-CoV-2/FLU/RSV plus assay is intended as an aid ?in the diagnosis of influenza from Nasopharyngeal swab specimens and ?should not be  used as a sole basis for treatment. Nasal washings and ?aspirates are unacceptable for Xpert Xpress SARS-CoV-2/FLU/RSV ?testing. ? ?Fact Sheet for Patients: ?EntrepreneurPulse.com.au ? ?Fact Sheet fo

## 2021-08-08 NOTE — Assessment & Plan Note (Addendum)
-   Transition off BiPAP since mentation improved and oxygenation maintained well with decreased FiO2. ?-now stable on 3L with saturation 94-96% ?D/c home with 3L [place] ?

## 2021-08-08 NOTE — Assessment & Plan Note (Addendum)
-   CTX 2g daily ?-follow repeat blood cultures--neg to date ?D/c home with 3 more days cefdinir to complete 10 days of therapy ?

## 2021-08-08 NOTE — Progress Notes (Signed)
Patient currently on 3L [place] with sat of 94% and seems to be tolerating.  Bipap is at bedside if needed.  Will continue to monitor. ?

## 2021-08-08 NOTE — Progress Notes (Signed)
PHARMACY - PHYSICIAN COMMUNICATION ?CRITICAL VALUE ALERT - BLOOD CULTURE IDENTIFICATION (BCID) ? ?[name] is an 81 y.o. female who presented to [facility] on 08/07/2021 with a chief complaint of SOB ? ?Assessment:  Streptococcus pneumoniae in blood (include suspected source if known) ? ?Name of physician (or Provider) Contacted: Dr Jarvis Newcomer ? ?Current antibiotics: cefepime and vancomycin ? ?Changes to prescribed antibiotics recommended:  ?Recommendations accepted by provider ? ?Results for orders placed or performed during the hospital encounter of 08/07/21  ?Blood Culture ID Panel (Reflexed) (Collected: 08/07/2021  7:10 PM)  ?Result Value Ref Range  ? Enterococcus faecalis NOT DETECTED NOT DETECTED  ? Enterococcus Faecium NOT DETECTED NOT DETECTED  ? Listeria monocytogenes NOT DETECTED NOT DETECTED  ? Staphylococcus species NOT DETECTED NOT DETECTED  ? Staphylococcus aureus (BCID) NOT DETECTED NOT DETECTED  ? Staphylococcus epidermidis NOT DETECTED NOT DETECTED  ? Staphylococcus lugdunensis NOT DETECTED NOT DETECTED  ? Streptococcus species DETECTED (A) NOT DETECTED  ? Streptococcus agalactiae NOT DETECTED NOT DETECTED  ? Streptococcus pneumoniae DETECTED (A) NOT DETECTED  ? Streptococcus pyogenes NOT DETECTED NOT DETECTED  ? A.calcoaceticus-baumannii NOT DETECTED NOT DETECTED  ? Bacteroides fragilis NOT DETECTED NOT DETECTED  ? Enterobacterales NOT DETECTED NOT DETECTED  ? Enterobacter cloacae complex NOT DETECTED NOT DETECTED  ? Escherichia coli NOT DETECTED NOT DETECTED  ? Klebsiella aerogenes NOT DETECTED NOT DETECTED  ? Klebsiella oxytoca NOT DETECTED NOT DETECTED  ? Klebsiella pneumoniae NOT DETECTED NOT DETECTED  ? Proteus species NOT DETECTED NOT DETECTED  ? Salmonella species NOT DETECTED NOT DETECTED  ? Serratia marcescens NOT DETECTED NOT DETECTED  ? Haemophilus influenzae NOT DETECTED NOT DETECTED  ? Neisseria meningitidis NOT DETECTED NOT DETECTED  ? Pseudomonas aeruginosa NOT DETECTED NOT DETECTED   ? Stenotrophomonas maltophilia NOT DETECTED NOT DETECTED  ? Candida albicans NOT DETECTED NOT DETECTED  ? Candida auris NOT DETECTED NOT DETECTED  ? Candida glabrata NOT DETECTED NOT DETECTED  ? Candida krusei NOT DETECTED NOT DETECTED  ? Candida parapsilosis NOT DETECTED NOT DETECTED  ? Candida tropicalis NOT DETECTED NOT DETECTED  ? Cryptococcus neoformans/gattii NOT DETECTED NOT DETECTED  ? ? ?[name] ?08/08/2021  10:50 AM ? ?

## 2021-08-08 NOTE — Progress Notes (Signed)
Pharmacy Antibiotic Note ? ?[name] is a 81 y.o. female admitted on 08/07/2021 with worsening pneumonia despite outpt treatment.  Pharmacy has been consulted for vancomycin and cefepime dosing. ? ?Plan: ?Vancomycin 1250mg  x1 then 750mg  IV Q24H. Goal AUC 400-550.  Expected AUC 500.  SCr 0.99.  ?Cefepime 2g IV Q12H. ? ?Height: 5\' 6"  (167.6 cm) ?Weight: 55 kg (121 lb 4.1 oz) ?IBW/kg (Calculated) : 59.3 ? ?Temp (24hrs), Avg:98.7 ?F (37.1 ?C), Min:98.7 ?F (37.1 ?C), Max:98.7 ?F (37.1 ?C) ? ?Recent Labs  ?Lab 08/07/21 ?1759 08/07/21 ?2047 08/07/21 ?2313  ?WBC 23.9*  --   --   ?CREATININE 0.99  --   --   ?LATICACIDVEN 1.5 2.6* 1.5  ?  ?Estimated Creatinine Clearance: 40 mL/min (by C-G formula based on SCr of 0.99 mg/dL).   ? ?Allergies  ?Allergen Reactions  ? Sulfa Antibiotics Other (See Comments)  ?  Don't remember reaction  ? ? ?Thank you for allowing pharmacy to be a part of this patient?s care. ? ?2048, PharmD, BCPS  ?08/08/2021 12:38 AM ? ?

## 2021-08-09 DIAGNOSIS — G9341 Metabolic encephalopathy: Secondary | ICD-10-CM | POA: Diagnosis not present

## 2021-08-09 DIAGNOSIS — A419 Sepsis, unspecified organism: Secondary | ICD-10-CM | POA: Diagnosis not present

## 2021-08-09 DIAGNOSIS — J9601 Acute respiratory failure with hypoxia: Secondary | ICD-10-CM | POA: Diagnosis not present

## 2021-08-09 LAB — CBC
HCT: 25.9 % — ABNORMAL LOW (ref 36.0–46.0)
Hemoglobin: 8 g/dL — ABNORMAL LOW (ref 12.0–15.0)
MCH: 25.4 pg — ABNORMAL LOW (ref 26.0–34.0)
MCHC: 30.9 g/dL (ref 30.0–36.0)
MCV: 82.2 fL (ref 80.0–100.0)
Platelets: 304 10*3/uL (ref 150–400)
RBC: 3.15 MIL/uL — ABNORMAL LOW (ref 3.87–5.11)
RDW: 17.2 % — ABNORMAL HIGH (ref 11.5–15.5)
WBC: 21.4 10*3/uL — ABNORMAL HIGH (ref 4.0–10.5)
nRBC: 0 % (ref 0.0–0.2)

## 2021-08-09 LAB — GLUCOSE, CAPILLARY: Glucose-Capillary: 108 mg/dL — ABNORMAL HIGH (ref 70–99)

## 2021-08-09 LAB — BASIC METABOLIC PANEL
Anion gap: 7 (ref 5–15)
BUN: 25 mg/dL — ABNORMAL HIGH (ref 8–23)
CO2: 23 mmol/L (ref 22–32)
Calcium: 8.1 mg/dL — ABNORMAL LOW (ref 8.9–10.3)
Chloride: 104 mmol/L (ref 98–111)
Creatinine, Ser: 0.81 mg/dL (ref 0.44–1.00)
GFR, Estimated: >60 mL/min (ref >60)
Glucose, Bld: 98 mg/dL (ref 70–99)
Potassium: 3.6 mmol/L (ref 3.5–5.1)
Sodium: 134 mmol/L — ABNORMAL LOW (ref 135–145)

## 2021-08-09 LAB — LEGIONELLA PNEUMOPHILA SEROGP 1 UR AG: L. pneumophila Serogp 1 Ur Ag: NEGATIVE

## 2021-08-09 LAB — URINE CULTURE: Culture: NO GROWTH

## 2021-08-09 LAB — MAGNESIUM: Magnesium: 2.1 mg/dL (ref 1.7–2.4)

## 2021-08-09 MED ORDER — LEVALBUTEROL HCL 0.63 MG/3ML IN NEBU
0.6300 mg | INHALATION_SOLUTION | Freq: Three times a day (TID) | RESPIRATORY_TRACT | Status: DC
  Administered 2021-08-09 – 2021-08-13 (×12): 0.63 mg via RESPIRATORY_TRACT
  Filled 2021-08-09 (×11): qty 3

## 2021-08-09 MED ORDER — ATORVASTATIN CALCIUM 10 MG PO TABS
20.0000 mg | ORAL_TABLET | Freq: Every day | ORAL | Status: DC
  Administered 2021-08-09 – 2021-08-14 (×6): 20 mg via ORAL
  Filled 2021-08-09 (×5): qty 1
  Filled 2021-08-09: qty 2

## 2021-08-09 MED ORDER — METOPROLOL TARTRATE 25 MG PO TABS
12.5000 mg | ORAL_TABLET | Freq: Two times a day (BID) | ORAL | Status: DC
  Administered 2021-08-09 – 2021-08-10 (×3): 12.5 mg via ORAL
  Filled 2021-08-09 (×3): qty 1

## 2021-08-09 MED ORDER — APIXABAN 5 MG PO TABS
5.0000 mg | ORAL_TABLET | Freq: Two times a day (BID) | ORAL | Status: DC
  Administered 2021-08-09 – 2021-08-14 (×11): 5 mg via ORAL
  Filled 2021-08-09 (×11): qty 1

## 2021-08-09 MED ORDER — FERROUS SULFATE 325 (65 FE) MG PO TABS
325.0000 mg | ORAL_TABLET | Freq: Every day | ORAL | Status: DC
  Administered 2021-08-09 – 2021-08-14 (×6): 325 mg via ORAL
  Filled 2021-08-09 (×6): qty 1

## 2021-08-09 MED ORDER — LEVOTHYROXINE SODIUM 75 MCG PO CAPS
75.0000 ug | ORAL_CAPSULE | Freq: Every day | ORAL | Status: DC
Start: 2021-08-10 — End: 2021-08-09

## 2021-08-09 MED ORDER — LEVALBUTEROL HCL 0.63 MG/3ML IN NEBU
0.6300 mg | INHALATION_SOLUTION | Freq: Three times a day (TID) | RESPIRATORY_TRACT | Status: DC
Start: 2021-08-09 — End: 2021-08-09
  Administered 2021-08-09: 0.63 mg via RESPIRATORY_TRACT
  Filled 2021-08-09: qty 3

## 2021-08-09 MED ORDER — LEVOTHYROXINE SODIUM 75 MCG PO TABS
75.0000 ug | ORAL_TABLET | Freq: Every day | ORAL | Status: DC
  Administered 2021-08-09 – 2021-08-14 (×6): 75 ug via ORAL
  Filled 2021-08-09 (×6): qty 1

## 2021-08-09 NOTE — Progress Notes (Signed)
?  ?       ?PROGRESS NOTE ? ?[name] [id number] DOB: Apr 09, 1941 DOA: 08/07/2021 ?PCP: Default, Provider, MD ? ?Brief History:  ?81 y.o. female with medical history significant for hypertension, atrial fibrillation, CVA. ?Patient was brought to the ED from nursing home with reports of difficulty breathing, O2 sats with 70% on room air, per EMS she was placed on 4 L O2 sat improved to 89%.  On my evaluation patient is awake and alert, agitated, speech is confused. ?I talked to patient's son who is power of attorney-[name].  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  If his mother's breathing gets worse, he would like her to be intubated.  She is a full code. ?  ?Patient was in the ED a month ago 3/17-COVID test was positive, chest x-ray showed pneumonia.  She was not septic and not hypoxic.  She was discharged to complete 5 days of cefdinir and azithromycin. ?  ?ED Course: Temperature 98.7.  Heart rate 102-114.  Respiratory rate 20-36.  Blood pressure systolic initially in the low 100s now dropping to 83 systolic..  O2 sats had dropped to 89% on 3 L.  She was placed on BiPAP. ?WBC 23.9.  Potassium 2.8.  Lactic acid 1.5 > 2.6. ?VBG showed pH of 7.3, PCO2 of 43. ?0.5 mg of Ativan given without improvement in agitation. ?  ?2.7 L of fluid given.  IV ceftriaxone and azithromycin started. ?Hospitalist to admit. ?  ?Hospital Course: Patient's blood cultures have grown S. pneumoniae, so antibiotics narrowed to ceftriaxone. Respiratory status has improved, weaned off BiPAP to nasal cannula.  Weaned off levophed on 08/09/21 AM  ? ?Assessment/Plan: ? ? ?Principal Problem: ?  Pneumococcal pneumonia ([place]) ?Active Problems: ?  Acute metabolic encephalopathy ?  Acute respiratory failure with hypoxia ([place]) ?  Septic shock  ([place]) ?  CVA (cerebral vascular accident) [facility]) ?  Hypertension ?  Persistent atrial fibrillation ([place]) ?  Hypothyroidism ?  Hypokalemia ?  Pneumococcal bacteremia ?  Anemia ?  Hypomagnesemia ? ?Assessment and Plan: ?* Pneumococcal pneumonia ([place]) ?- Continue ceftriaxone. MRSA negative ?-DC vancomycin. ?-personally reviewed CXR--bilateral opacities ? ?Septic shock ([place]) ?Due to pneumococcal pneumonia with bacteremia.  ?-Required low dose levophed to maintain pressures despite IV fluid administration.  ?- Weaned off levophed am 08/09/21 ?-lactic acid peaked 2.8>>1.5 ?- continue ceftriaxone ? ?Acute respiratory failure with hypoxia ([place]) ?- Transition off BiPAP since mentation improved and oxygenation maintained well with decreased FiO2. ? ?Acute metabolic encephalopathy ?Likely secondary to multifocal pneumonia and severe sepsis. This has overall improved significantly from admission.  ?- Treat infection and monitor closely. Now that more coherent, if stable off BiPAP can start diet. ? ?Hypomagnesemia ?repleted ? ?Anemia ?Unclear etiology, though iron and B12 supplements are on home med list. Does have mildly low hgb at available baseline. No bleeding noted.  ?- Recheck H/H with anemia panel, T&S with intention to transfuse if remains hypotensive with hgb < 8 or if hgb < 7. ? ?Pneumococcal bacteremia ?- Repeat Cx's in AM to confirm clearance. Would need echo if persistently positive.  ?- CTX 2g daily ?-follow repeat blood cultures ? ?Hypokalemia ?- Resolved with supplementation ? ?Hypothyroidism ?Restart synthroid ? ?Persistent atrial fibrillation ([place]) ?-EKG shows atrial fibrillation.  She is on  Eliquis and carvedilol PTA ?-Hold carvedilol with hypotension initially ?-start metoprolol in lieu of coreg due to RVR ?-restart apixaban ? ?Hypertension ?Holding ramipril and coreg due to hypotension initially ?-starting metoprolol in lieu of coreg due to afib RVR ? ? ? ? ? ? ?Family Communication:   son updated  4/19 ? ?Consultants:  none ? ?Code Status:  FULL  ? ?DVT Prophylaxis:  apixaban ? ? ?Procedures: ?As Listed in Progress Note Above ? ?Antibiotics: ?Ceftriaxone 4/17>> ? ?RN Pressure Injury Documentation: ?Pressure Injury 08/08/21 Sacrum Anterior Stage 2 -  Partial thickness loss of dermis presenting as a shallow open injury with a red, pink wound bed without slough. Redness (Active)  ?08/08/21 0845  ?Location: Sacrum  ?Location Orientation: Anterior  ?Staging: Stage 2 -  Partial thickness loss of dermis presenting as a shallow open injury with a red, pink wound bed without slough.  ?Wound Description (Comments): Redness  ?Present on Admission: Yes  ?Dressing Type Foam - Lift dressing to assess site every shift 08/08/21 2015  ? ? ? ? ? ? ?Subjective: ?Patient continues to have sob, but is improving.  Denies cp, n/v/d, abd pain, f/c ? ?Objective: ?Vitals:  ? 08/09/21 0400 08/09/21 0500 08/09/21 0600 08/09/21 0700  ?BP: (!) 113/53 132/89 111/60 115/75  ?Pulse: (!) 105 (!) 113 96 (!) 104  ?Resp: (!) 26 (!) 32 (!) 22 (!) 21  ?Temp: 97.7 ?F (36.5 ?C)     ?TempSrc: Oral     ?SpO2: 97% 96% 98% 96%  ?Weight:      ?Height:      ? ? ?Intake/Output Summary (Last 24 hours) at 08/09/2021 0907 ?Last data filed at 08/09/2021 [id number] ?Gross per 24 hour  ?Intake 2397.3 ml  ?Output 750 ml  ?Net 1647.3 ml  ? ?Weight change: 7.8 kg ?Exam: ? ?General:  Pt is alert, follows commands appropriately, not in acute distress ?HEENT: No icterus, No thrush, No neck mass, [place]/AT ?Cardiovascular: RRR, S1/S2, no rubs, no gallops ?Respiratory: bilateral rales.  No wheeze ?Abdomen: Soft/+BS, non tender, non distended, no guarding ?Extremities: 1 + LE edema, No lymphangitis, No petechiae, No rashes, no synovitis ? ? ?Data Reviewed: ?I have personally reviewed following labs and imaging studies ?Basic Metabolic Panel: ?Recent Labs  ?Lab 08/07/21 ?1759 08/08/21 ?0410 08/09/21 ?0351  ?NA 130* 131* 134*  ?K 2.8* 3.7 3.6  ?CL 95* 98 104  ?CO2 25 24 23   ?GLUCOSE  123* 127* 98  ?BUN 26* 25* 25*  ?CREATININE 0.99 0.95 0.81  ?CALCIUM 7.9* 7.7* 8.1*  ?MG 1.1* 1.6* 2.1  ? ?Liver Function Tests: ?Recent Labs  ?Lab 08/07/21 ?1759  ?AST 23  ?ALT 13  ?ALKPHOS 102  ?BILITOT 1.2  ?PROT 6.4*  ?ALBUMIN 2.6*  ? ?No results for input(s): LIPASE, AMYLASE in the last 168 hours. ?No results for input(s): AMMONIA in the last 168 hours. ?Coagulation Profile: ?No results for input(s): INR, PROTIME in the last 168 hours. ?CBC: ?Recent Labs  ?Lab 08/07/21 ?1759 08/08/21 ?0410 08/08/21 ?1705 08/09/21 ?0351  ?WBC 23.9* 25.6*  --  21.4*  ?NEUTROABS 22.8*  --   --   --   ?HGB 8.2* 7.5* 8.1* 8.0*  ?HCT 25.9* 23.4* 26.0* 25.9*  ?MCV 82.0 82.4  --  82.2  ?PLT 258 234  --  304  ? ?Cardiac Enzymes: ?No results for input(s): CKTOTAL, CKMB, CKMBINDEX, TROPONINI in the last 168 hours. ?BNP: ?Invalid input(s): POCBNP ?CBG: ?Recent Labs  ?Lab 08/09/21 ?0726  ?GLUCAP 108*  ? ?HbA1C: ?No  results for input(s): HGBA1C in the last 72 hours. ?Urine analysis: ?   ?Component Value Date/Time  ? [place] YELLOW 08/08/2021 0118  ? APPEARANCEUR CLEAR 08/08/2021 0118  ? LABSPEC 1.015 08/08/2021 0118  ? PHURINE 5.0 08/08/2021 0118  ? GLUCOSEU NEGATIVE 08/08/2021 0118  ? [place] NEGATIVE 08/08/2021 0118  ? [place] NEGATIVE 08/08/2021 0118  ? [place] NEGATIVE 08/08/2021 0118  ? PROTEINUR 30 (A) 08/08/2021 0118  ? NITRITE NEGATIVE 08/08/2021 0118  ? LEUKOCYTESUR NEGATIVE 08/08/2021 0118  ? ?Sepsis Labs: ?@[email or web address] (procalcitonin:4,lacticidven:4) ?) ?Recent Results (from the past 240 hour(s))  ?Resp Panel by RT-PCR (Flu A&B, Covid) Nasopharyngeal Swab     Status: None  ? Collection Time: 08/07/21  5:50 PM  ? Specimen: Nasopharyngeal Swab; Nasopharyngeal(NP) swabs in vial transport medium  ?Result Value Ref Range Status  ? SARS Coronavirus 2 by RT PCR NEGATIVE NEGATIVE Final  ?  Comment: (NOTE) ?SARS-CoV-2 target nucleic acids are NOT DETECTED. ? ?The SARS-CoV-2 RNA is generally detectable in upper respiratory ?specimens  during the acute phase of infection. The lowest ?concentration of SARS-CoV-2 viral copies this assay can detect is ?138 copies/mL. A negative result does not preclude SARS-Cov-2 ?infection and should not be used as

## 2021-08-09 NOTE — Hospital Course (Addendum)
81 y.o. female with medical history significant for hypertension, atrial fibrillation, CVA. ?Patient was brought to the ED from nursing home with reports of difficulty breathing, O2 sats with 70% on room air, per EMS she was placed on 4 L O2 sat improved to 89%.  On my evaluation patient is awake and alert, agitated, speech is confused. ?I talked to patient's son who is power of attorney-[name].  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  If his mother's breathing gets worse, he would like her to be intubated.  She is a full code. ?  ?Patient was in the ED a month ago 3/17-COVID test was positive, chest x-ray showed pneumonia.  She was not septic and not hypoxic.  She was discharged to complete 5 days of cefdinir and azithromycin. ?  ?ED Course: Temperature 98.7.  Heart rate 102-114.  Respiratory rate 20-36.  Blood pressure systolic initially in the low 737T now dropping to 83 systolic..  O2 sats had dropped to 89% on 3 L.  She was placed on BiPAP. ?WBC 23.9.  Potassium 2.8.  Lactic acid 1.5 > 2.6. ?VBG showed pH of 7.3, PCO2 of 43. ?0.5 mg of Ativan given without improvement in agitation. ?  ?2.7 L of fluid given.  IV ceftriaxone and azithromycin started. ?Hospitalist to admit. ?  ?Hospital Course: Patient's blood cultures have grown S. pneumoniae, so antibiotics narrowed to ceftriaxone. Respiratory status has improved, weaned off BiPAP to  2Lnasal cannula.  Weaned off levophed on 08/09/21 AM.  Afib RVR with HR in 150s on 4/20, so IV lopressor ordered and Echo ordered.  HRs remained elevated with IV lopressor titration, so diltiazem was started. ?

## 2021-08-09 NOTE — Assessment & Plan Note (Addendum)
Holding ramipril and coreg due to hypotension initially ?-starting metoprolol in lieu of coreg due to afib RVR ?-now on diltiazem for afib ?-restart ramipril ?

## 2021-08-09 NOTE — TOC Initial Note (Signed)
Transition of Care (TOC) - Initial/Assessment Note  ? ? ?Patient Details  ?Name: [name] ?MRN: [id number] ?Date of Birth: 02-28-1941 ? ?Transition of Care (TOC) CM/SW Contact:    ?Salome Arnt, LCSW ?Phone Number: ?08/09/2021, 2:39 PM ? ?Clinical Narrative:  Pt admitted due to multifocal pneumonia. Pt has been a resident at [facility] for about 6 months. Her son, Alroy Dust reports he is HCPOA. Pt oriented to self only per chart. Alroy Dust requests return to [facility] when medically stable. Per Aram Beecham at [facility], pt requires assist with bathing and dressing. She ambulates with a walker and occasional stand-by assist. Okay to return if ALF appropriate. PT evaluation pending. TOC will continue to follow.                ? ? ?Expected Discharge Plan: Assisted Living ?Barriers to Discharge: Continued Medical Work up ? ? ?Patient Goals and CMS Choice ?Patient states their goals for this hospitalization and ongoing recovery are:: return to ALF ?  ?Choice offered to / list presented to : Adult Children ? ?Expected Discharge Plan and Services ?Expected Discharge Plan: Assisted Living ?In-house Referral: Clinical Social Work ?  ?Post Acute Care Choice: Resumption of Svcs/PTA Provider ?Living arrangements for the past 2 months: [place] ?                ?  ?  ?  ?  ?  ?  ?  ?  ?  ?  ? ?Prior Living Arrangements/Services ?Living arrangements for the past 2 months: [place] ?  ?Patient language and need for interpreter reviewed:: Yes ?Do you feel safe going back to the place where you live?: Yes      ?Need for Family Participation in Patient Care: Yes (Comment) ?Care giver support system in place?: Yes (comment) ?Current home services: DME (walker) ?Criminal Activity/Legal Involvement Pertinent to Current Situation/Hospitalization: No - Comment as needed ? ?Activities of Daily Living ?Home Assistive Devices/Equipment: Gilford Rile (specify type) ?ADL Screening (condition at time of  admission) ?Patient's cognitive ability adequate to safely complete daily activities?: Yes ?Is the patient deaf or have difficulty hearing?: No ?Does the patient have difficulty seeing, even when wearing glasses/contacts?: No ?Does the patient have difficulty concentrating, remembering, or making decisions?: Yes ?Patient able to express need for assistance with ADLs?: Yes ?Does the patient have difficulty dressing or bathing?: Yes ?Independently performs ADLs?: No ?Communication: Independent ?Dressing (OT): Needs assistance ?Is this a change from baseline?: Pre-admission baseline ?Grooming: Needs assistance ?Is this a change from baseline?: Pre-admission baseline ?Feeding: Independent ?Bathing: Dependent ?Is this a change from baseline?: Pre-admission baseline ?Toileting: Needs assistance ?Is this a change from baseline?: Pre-admission baseline ?In/Out Bed: Independent with device (comment) Gilford Rile) ?Walks in Home: Independent with device (comment) Gilford Rile) ?Does the patient have difficulty walking or climbing stairs?: No ?Weakness of Legs: None ?Weakness of Arms/Hands: None ? ?Permission Sought/Granted ?  ?  ?   ? Permission granted to share info w AGENCY: [place] granted to share info w Relationship: ALF ?   ? ?Emotional Assessment ?  ?Attitude/Demeanor/Rapport: Unable to Assess ?Affect (typically observed): Unable to Assess ?Orientation: : Oriented to Self ?Alcohol / Substance Use: Not Applicable ?Psych Involvement: No (comment) ? ?Admission diagnosis:  Acute respiratory failure with hypoxia ([place]) [J96.01] ?Multifocal pneumonia [J18.9] ?Community acquired pneumonia of right upper lobe of lung [J18.9] ?Sepsis, due to unspecified organism, unspecified whether acute organ dysfunction present ([place]) [A41.9] ?Patient Active Problem List  ?  Diagnosis Date Noted  ? Hypomagnesemia 08/09/2021  ? Pneumococcal bacteremia 08/08/2021  ? Anemia of chronic disease   ? Pneumococcal pneumonia ([place]) 08/07/2021  ?  Acute metabolic encephalopathy 123456  ? Acute respiratory failure with hypoxia ([place]) 08/07/2021  ? Septic shock ([place]) 08/07/2021  ? Hypokalemia 08/07/2021  ? COVID-19 virus infection 07/07/2021  ? CAP (community acquired pneumonia) 07/07/2021  ? 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 CVA (cerebral vascular accident) ([place]) 01/01/2017  ? Hypertension 01/01/2017  ? Hyperlipidemia 01/01/2017  ? Persistent atrial fibrillation ([place]) 01/01/2017  ? Hypothyroidism 01/01/2017  ? 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 Lower extremity edema 01/01/2017  ? ?PCP:  Default, Provider, MD ?Pharmacy:   ?[facility], [address] ?[place] [address] ?Phone: [phone] Fax: [phone] ? ?[place], [address] ?[address] ?[place] [address] ?Phone: [phone] Fax: [phone] ? ? ? ? ?Social Determinants of Health (SDOH) Interventions ?  ? ?Readmission Risk Interventions ? ?  08/09/2021  ?  2:34 PM  ?Readmission Risk Prevention Plan  ?Transportation Screening Complete  ?[place] or Home Care Consult Complete  ?Social Work Consult for [place] Planning/Counseling Complete  ?Palliative Care Screening Not Applicable  ?Medication Review Press photographer) Complete  ? ? ? ?

## 2021-08-09 NOTE — Assessment & Plan Note (Addendum)
repleted ?Increased mag ox to bid ?

## 2021-08-09 NOTE — Assessment & Plan Note (Signed)
Restart synthroid ?

## 2021-08-10 ENCOUNTER — Inpatient Hospital Stay (HOSPITAL_COMMUNITY): Payer: Medicare Other

## 2021-08-10 DIAGNOSIS — A419 Sepsis, unspecified organism: Secondary | ICD-10-CM | POA: Diagnosis not present

## 2021-08-10 DIAGNOSIS — I4891 Unspecified atrial fibrillation: Secondary | ICD-10-CM

## 2021-08-10 DIAGNOSIS — G9341 Metabolic encephalopathy: Secondary | ICD-10-CM | POA: Diagnosis not present

## 2021-08-10 DIAGNOSIS — J9601 Acute respiratory failure with hypoxia: Secondary | ICD-10-CM | POA: Diagnosis not present

## 2021-08-10 DIAGNOSIS — E876 Hypokalemia: Secondary | ICD-10-CM | POA: Diagnosis not present

## 2021-08-10 LAB — CULTURE, BLOOD (ROUTINE X 2)
Special Requests: ADEQUATE
Special Requests: ADEQUATE

## 2021-08-10 LAB — ECHOCARDIOGRAM COMPLETE
AR max vel: 1.86 cm2
AV Area VTI: 1.76 cm2
AV Area mean vel: 1.66 cm2
AV Mean grad: 3 mmHg
AV Peak grad: 5.4 mmHg
Ao pk vel: 1.16 m/s
Area-P 1/2: 4.41 cm2
Height: 63 in
MV VTI: 2.16 cm2
S' Lateral: 2.9 cm
Weight: 2317.48 oz

## 2021-08-10 LAB — BASIC METABOLIC PANEL
Anion gap: 9 (ref 5–15)
BUN: 23 mg/dL (ref 8–23)
CO2: 22 mmol/L (ref 22–32)
Calcium: 8.1 mg/dL — ABNORMAL LOW (ref 8.9–10.3)
Chloride: 103 mmol/L (ref 98–111)
Creatinine, Ser: 0.78 mg/dL (ref 0.44–1.00)
GFR, Estimated: >60 mL/min (ref >60)
Glucose, Bld: 83 mg/dL (ref 70–99)
Potassium: 3.7 mmol/L (ref 3.5–5.1)
Sodium: 134 mmol/L — ABNORMAL LOW (ref 135–145)

## 2021-08-10 LAB — CBC
HCT: 26.6 % — ABNORMAL LOW (ref 36.0–46.0)
Hemoglobin: 8.2 g/dL — ABNORMAL LOW (ref 12.0–15.0)
MCH: 25.6 pg — ABNORMAL LOW (ref 26.0–34.0)
MCHC: 30.8 g/dL (ref 30.0–36.0)
MCV: 83.1 fL (ref 80.0–100.0)
Platelets: 264 10*3/uL (ref 150–400)
RBC: 3.2 MIL/uL — ABNORMAL LOW (ref 3.87–5.11)
RDW: 17.2 % — ABNORMAL HIGH (ref 11.5–15.5)
WBC: 19.2 10*3/uL — ABNORMAL HIGH (ref 4.0–10.5)
nRBC: 0 % (ref 0.0–0.2)

## 2021-08-10 LAB — MAGNESIUM: Magnesium: 1.9 mg/dL (ref 1.7–2.4)

## 2021-08-10 MED ORDER — CHLORHEXIDINE GLUCONATE 0.12 % MT SOLN
15.0000 mL | Freq: Two times a day (BID) | OROMUCOSAL | Status: DC
  Administered 2021-08-10 – 2021-08-14 (×7): 15 mL via OROMUCOSAL
  Filled 2021-08-10 (×8): qty 15

## 2021-08-10 MED ORDER — DILTIAZEM HCL 25 MG/5ML IV SOLN
10.0000 mg | Freq: Once | INTRAVENOUS | Status: AC
  Administered 2021-08-10: 10 mg via INTRAVENOUS
  Filled 2021-08-10: qty 5

## 2021-08-10 MED ORDER — ORAL CARE MOUTH RINSE
15.0000 mL | Freq: Two times a day (BID) | OROMUCOSAL | Status: DC
  Administered 2021-08-11 – 2021-08-13 (×5): 15 mL via OROMUCOSAL

## 2021-08-10 MED ORDER — GUAIFENESIN-DM 100-10 MG/5ML PO SYRP
5.0000 mL | ORAL_SOLUTION | ORAL | Status: DC | PRN
  Administered 2021-08-10: 5 mL via ORAL
  Filled 2021-08-10: qty 5

## 2021-08-10 MED ORDER — DILTIAZEM LOAD VIA INFUSION: 10.0000 mg | Freq: Once | INTRAVENOUS | Status: DC

## 2021-08-10 MED ORDER — METOPROLOL TARTRATE 5 MG/5ML IV SOLN
5.0000 mg | Freq: Four times a day (QID) | INTRAVENOUS | Status: DC
  Administered 2021-08-10 (×3): 5 mg via INTRAVENOUS
  Filled 2021-08-10 (×3): qty 5

## 2021-08-10 MED ORDER — METOPROLOL TARTRATE 5 MG/5ML IV SOLN
7.5000 mg | Freq: Four times a day (QID) | INTRAVENOUS | Status: DC
  Administered 2021-08-10 – 2021-08-14 (×13): 7.5 mg via INTRAVENOUS
  Filled 2021-08-10 (×13): qty 10

## 2021-08-10 NOTE — Evaluation (Signed)
Physical Therapy Evaluation ?Patient Details ?Name: [name] ?MRN: [id number] ?DOB: 21-Aug-1940 ?Today's Date: 08/10/2021 ? ?History of Present Illness ? [name] is a 82 y.o. female with medical history significant for hypertension, atrial fibrillation, CVA.  Patient was brought to the ED from nursing home with reports of difficulty breathing, O2 sats with 70% on room air, per EMS she was placed on 4 L O2 sat improved to 89%.  On my evaluation patient is awake and alert, agitated, speech is confused.  I talked to patient's son who is power of attorney-[name].  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  If his mother's breathing gets worse, he would like her to be intubated.  She is a full code. ?  ?Clinical Impression ? Patient demonstrates slow labored movement for sitting up at bedside with c/o pain left foot with pressure/movement, unsteady on feet and limited to a few side steps before having to sit mostly due to c/o SOB, fatigue and HR increased to 150's.  Patient tolerated sitting up in chair after therapy - nursing staff notified.  Patient will benefit from continued skilled physical therapy in hospital and recommended venue below to increase strength, balance, endurance for safe ADLs and gait.  ? ?   ?   ? ?Recommendations for follow up therapy are one component of a multi-disciplinary discharge planning process, led by the attending physician.  Recommendations may be updated based on patient status, additional functional criteria and insurance authorization. ? ?Follow Up Recommendations Skilled nursing-short term rehab (<3 hours/day) ? ?  ?Assistance Recommended at Discharge Intermittent Supervision/Assistance  ?Patient can return home with the following ? A lot of help with  bathing/dressing/bathroom;A little help with bathing/dressing/bathroom;Help with stairs or ramp for entrance;Assistance with cooking/housework ? ?  ?Equipment Recommendations None recommended by PT  ?Recommendations for Other Services ?    ?  ?Functional Status Assessment Patient has had a recent decline in their functional status and demonstrates the ability to make significant improvements in function in a reasonable and predictable amount of time.  ? ?  ?Precautions / Restrictions Precautions ?Precautions: Fall ?Restrictions ?Weight Bearing Restrictions: No  ? ?  ? ?Mobility ? Bed Mobility ?Overal bed mobility: Needs Assistance ?Bed Mobility: Supine to Sit ?  ?  ?Supine to sit: Min guard, Min assist ?  ?  ?General bed mobility comments: increased time, labored movement ?  ? ?Transfers ?Overall transfer level: Needs assistance ?Equipment used: Rolling walker (2 wheels) ?Transfers: Sit to/from Stand, Bed to chair/wheelchair/BSC ?Sit to Stand: Min assist ?  ?Step pivot transfers: Min assist, Mod assist ?  ?  ?  ?General transfer comment: slow labored movement ?  ? ?Ambulation/Gait ?Ambulation/Gait assistance: Mod assist ?Gait Distance (Feet): 4 Feet ?Assistive device: Rolling walker (2 wheels) ?Gait Pattern/deviations: Decreased step length - right, Decreased step length - left, Decreased stride length ?Gait velocity: decreased ?  ?  ?General Gait Details: limited to a few slow labored unsteady side steps before having to sit due to fatigue, SOB ? ?Stairs ?  ?  ?  ?  ?  ? ?Wheelchair Mobility ?  ? ?Modified Rankin (Stroke Patients Only) ?  ? ?  ? ?Balance Overall balance assessment: Needs assistance ?Sitting-balance support: Feet supported, No upper extremity supported ?Sitting balance-Leahy Scale:  Fair ?Sitting balance - Comments: fair/good seated at EOB ?  ?Standing balance support: During functional activity, No upper extremity supported ?Standing balance-Leahy Scale: Poor ?Standing balance comment: fair/poor  using RW ?  ?  ?  ?  ?  ?  ?  ?  ?  ?  ?  ?   ? ? ? ?Pertinent Vitals/Pain Pain Assessment ?Pain Assessment: Faces ?Faces Pain Scale: Hurts little more ?Pain Location: left foot ?Pain Descriptors / Indicators: Grimacing, Guarding, Sore ?Pain Intervention(s): Limited activity within patient's tolerance, Monitored during session, Repositioned  ? ? ?Home Living Family/patient expects to be discharged to:: Assisted living ?  ?  ?  ?  ?  ?  ?  ?  ?Home Equipment: Conservation officer, nature (2 wheels) ?   ?  ?Prior Function Prior Level of Function : Needs assist ?  ?  ?  ?Physical Assist : Mobility (physical);ADLs (physical) ?Mobility (physical): Bed mobility;Transfers;Gait;Stairs ?  ?Mobility Comments: household ambulator using RW ?ADLs Comments: assisted by ALF staff ?  ? ? ?Hand Dominance  ?   ? ?  ?Extremity/Trunk Assessment  ? Upper Extremity Assessment ?Upper Extremity Assessment: Generalized weakness ?  ? ?Lower Extremity Assessment ?Lower Extremity Assessment: Generalized weakness ?  ? ?Cervical / Trunk Assessment ?Cervical / Trunk Assessment: Normal  ?Communication  ?    ?Cognition Arousal/Alertness: Awake/alert ?Behavior During Therapy: [facility] for tasks assessed/performed, Anxious ?Overall Cognitive Status: History of cognitive impairments - at baseline ?  ?  ?  ?  ?  ?  ?  ?  ?  ?  ?  ?  ?  ?  ?  ?  ?  ?  ?  ? ?  ?General Comments   ? ?  ?Exercises    ? ?Assessment/Plan  ?  ?PT Assessment Patient needs continued PT services  ?PT Problem List Decreased strength;Decreased activity tolerance;Decreased balance;Decreased mobility ? ?   ?  ?PT Treatment Interventions DME instruction;Gait training;Stair training;Functional mobility training;Therapeutic activities;Therapeutic exercise;Patient/family education;Balance training   ? ?PT Goals (Current goals can be found in the Care Plan section)  ?Acute Rehab PT Goals ?Patient Stated Goal: return home with ALF staff to assist ?PT Goal Formulation: With patient ?Time For Goal  Achievement: 08/24/21 ?Potential to Achieve Goals: Good ? ?  ?Frequency Min 3X/week ?  ? ? ?Co-evaluation   ?  ?  ?  ?  ? ? ?  ?AM-PAC PT "6 Clicks" Mobility  ?Outcome Measure Help needed turning from your back to your side while in a flat bed without using bedrails?: A Little ?Help needed moving from lying on your back to sitting on the side of a flat bed without using bedrails?: A Little ?Help needed moving to and from a bed to a chair (including a wheelchair)?: A Lot ?Help needed standing up from a chair using your arms (e.g., wheelchair or bedside chair)?: A Little ?Help needed to walk in hospital room?: A Lot ?Help needed climbing 3-5 steps with a railing? : A Lot ?6 Click Score: 15 ? ?  ?End of Session Equipment Utilized During Treatment: Oxygen ?Activity Tolerance: Patient tolerated treatment well;Patient limited by fatigue ?Patient left: in chair;with call bell/phone within reach ?Nurse Communication: Mobility status ?PT Visit Diagnosis: Unsteadiness on feet (R26.81);Other abnormalities of gait and mobility (R26.89);Muscle weakness (generalized) (M62.81) ?  ? ?Time: [id number] ?PT Time Calculation (min) (ACUTE ONLY): 22 min ? ? ?Charges:   PT Evaluation ?$PT Eval Moderate Complexity: 1 Mod ?PT Treatments ?$Therapeutic Activity: 8-22 mins ?  ?   ? ? ?  4:02 PM, 08/10/21 ?Lonell Grandchild, MPT ?Physical Therapist with  ?[facility] ?[phone] office ?[phone] mobile phone ? ? ?

## 2021-08-10 NOTE — Plan of Care (Signed)

## 2021-08-10 NOTE — Progress Notes (Signed)
?  ?       ?PROGRESS NOTE ? ?[name] [id number] DOB: January 04, 1941 DOA: 08/07/2021 ?PCP: Default, Provider, MD ? ?Brief History:  ?81 y.o. female with medical history significant for hypertension, atrial fibrillation, CVA. ?Patient was brought to the ED from nursing home with reports of difficulty breathing, O2 sats with 70% on room air, per EMS she was placed on 4 L O2 sat improved to 89%.  On my evaluation patient is awake and alert, agitated, speech is confused. ?I talked to patient's son who is power of attorney-[name].  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  If his mother's breathing gets worse, he would like her to be intubated.  She is a full code. ?  ?Patient was in the ED a month ago 3/17-COVID test was positive, chest x-ray showed pneumonia.  She was not septic and not hypoxic.  She was discharged to complete 5 days of cefdinir and azithromycin. ?  ?ED Course: Temperature 98.7.  Heart rate 102-114.  Respiratory rate 20-36.  Blood pressure systolic initially in the low 100s now dropping to 83 systolic..  O2 sats had dropped to 89% on 3 L.  She was placed on BiPAP. ?WBC 23.9.  Potassium 2.8.  Lactic acid 1.5 > 2.6. ?VBG showed pH of 7.3, PCO2 of 43. ?0.5 mg of Ativan given without improvement in agitation. ?  ?2.7 L of fluid given.  IV ceftriaxone and azithromycin started. ?Hospitalist to admit. ?  ?Hospital Course: Patient's blood cultures have grown S. pneumoniae, so antibiotics narrowed to ceftriaxone. Respiratory status has improved, weaned off BiPAP to nasal cannula.  Weaned off levophed on 08/09/21 AM  ? ? ?Assessment and Plan: ?* Septic shock ([place]) ?Due to pneumococcal pneumonia with bacteremia.  ?-Required low dose levophed to maintain pressures despite IV fluid administration.  ?- Weaned  off levophed am 08/09/21 ?-lactic acid peaked 2.8>>1.5 ?- continue ceftriaxone ? ?Acute metabolic encephalopathy ?secondary to multifocal pneumonia and severe sepsis.  ?-overall improved significantly from admission.  ?- Treat infection and monitor closely.  ? ?Pneumococcal pneumonia ([place]) ?- Continue ceftriaxone. MRSA negative ?-DC vancomycin. ?-urine streptococcus pneumoniae antigen positive ?-personally reviewed CXR--bilateral opacities ? ?Acute respiratory failure with hypoxia ([place]) ?- Transition off BiPAP since mentation improved and oxygenation maintained well with decreased FiO2. ?-now stable on 2L ? ?Persistent atrial fibrillation ([place]) ?-EKG shows atrial fibrillation.   ?-on Eliquis and carvedilol PTA ?-Hold carvedilol with hypotension initially ?-increase IV metoprolol in lieu of coreg due to RVR ?-restart apixaban ?-echo ? ?Hypomagnesemia ?repleted ? ?Anemia of chronic disease ?No bleeding noted.  ?- Recheck H/H with anemia panel, T&S with intention to transfuse if remains hypotensive with hgb < 8 or if hgb < 7. ?-iron saturation 7%, ferritin 296 ?-B12 2062 ?-folate 9.9 ?-restarted ferrous sulfate ? ?Pneumococcal bacteremia ? - CTX 2g daily ?-follow repeat blood cultures--neg to date ? ?Hypokalemia ?- supplemented ? ?Hypothyroidism ?Restart synthroid ? ?Hypertension ?Holding ramipril and coreg due to hypotension initially ?-starting metoprolol in lieu of coreg due to afib RVR ? ?Family Communication:   son updated 4/20 ?  ?Consultants:  none ?  ?Code Status:  FULL  ?  ?DVT Prophylaxis:  apixaban ?  ?  ?Procedures: ?As Listed in Progress Note Above ?  ?Antibiotics: ?Ceftriaxone 4/17>> ? ? ? ? ? ? ?  RN Pressure Injury Documentation: ?Pressure Injury 08/08/21 Sacrum Anterior Stage 2 -  Partial thickness loss of dermis presenting as a shallow open injury with a red, pink wound bed without slough. Redness (Active)  ?08/08/21 0845  ?Location: Sacrum  ?Location Orientation: Anterior  ?Staging: Stage 2 -  Partial  thickness loss of dermis presenting as a shallow open injury with a red, pink wound bed without slough.  ?Wound Description (Comments): Redness  ?Present on Admission: Yes  ?Dressing Type Foam - Lift dressing to assess site every shift 08/10/21 0840  ? ? ? ? ? ? ?Subjective: ?Patient complains of dyspnea on exertion.  Denies f/c, cp, n/v/d, abd pain. ? ?Objective: ?Vitals:  ? 08/10/21 1245 08/10/21 1300 08/10/21 1400 08/10/21 1500  ?BP:  137/73 (!) 121/100 (!) 136/95  ?Pulse: (!) 114 (!) 103 (!) 104 (!) 128  ?Resp: 19 (!) 22 17 14   ?Temp:      ?TempSrc:      ?SpO2: 100% 95% 100% 92%  ?Weight:      ?Height:      ? ? ?Intake/Output Summary (Last 24 hours) at 08/10/2021 1552 ?Last data filed at 08/10/2021 1502 ?Gross per 24 hour  ?Intake 100 ml  ?Output 550 ml  ?Net -450 ml  ? ?Weight change: 2.9 kg ?Exam: ? ?General:  Pt is alert, follows commands appropriately, not in acute distress ?HEENT: No icterus, No thrush, No neck mass, [place]/AT ?Cardiovascular: IRRR, S1/S2, no rubs, no gallops ?Respiratory: bibasilar rales, L>R. No wheeze ?Abdomen: Soft/+BS, non tender, non distended, no guarding ?Extremities: No edema, No lymphangitis, No petechiae, No rashes, no synovitis ? ? ?Data Reviewed: ?I have personally reviewed following labs and imaging studies ?Basic Metabolic Panel: ?Recent Labs  ?Lab 08/07/21 ?1759 08/08/21 ?0410 08/09/21 ?[id number] 08/10/21 ?[id number]  ?NA 130* 131* 134* 134*  ?K 2.8* 3.7 3.6 3.7  ?CL 95* 98 104 103  ?CO2 25 24 23 22   ?GLUCOSE 123* 127* 98 83  ?BUN 26* 25* 25* 23  ?CREATININE 0.99 0.95 0.81 0.78  ?CALCIUM 7.9* 7.7* 8.1* 8.1*  ?MG 1.1* 1.6* 2.1 1.9  ? ?Liver Function Tests: ?Recent Labs  ?Lab 08/07/21 ?1759  ?AST 23  ?ALT 13  ?ALKPHOS 102  ?BILITOT 1.2  ?PROT 6.4*  ?ALBUMIN 2.6*  ? ?No results for input(s): LIPASE, AMYLASE in the last 168 hours. ?No results for input(s): AMMONIA in the last 168 hours. ?Coagulation Profile: ?No results for input(s): INR, PROTIME in the last 168 hours. ?CBC: ?Recent Labs  ?Lab  08/07/21 ?1759 08/08/21 ?0410 08/08/21 ?1705 08/09/21 ?[id number] 08/10/21 ?[id number]  ?WBC 23.9* 25.6*  --  21.4* 19.2*  ?NEUTROABS 22.8*  --   --   --   --   ?HGB 8.2* 7.5* 8.1* 8.0* 8.2*  ?HCT 25.9* 23.4* 26.0* 25.9* 26.6*  ?MCV 82.0 82.4  --  82.2 83.1  ?PLT 258 234  --  304 264  ? ?Cardiac Enzymes: ?No results for input(s): CKTOTAL, CKMB, CKMBINDEX, TROPONINI in the last 168 hours. ?BNP: ?Invalid input(s): POCBNP ?CBG: ?Recent Labs  ?Lab 08/09/21 ?0726  ?GLUCAP 108*  ? ?HbA1C: ?No results for input(s): HGBA1C in the last 72 hours. ?Urine analysis: ?   ?Component Value Date/Time  ? [place] YELLOW 08/08/2021 0118  ? APPEARANCEUR CLEAR 08/08/2021 0118  ? LABSPEC 1.015 08/08/2021 0118  ? PHURINE 5.0 08/08/2021 0118  ? GLUCOSEU NEGATIVE 08/08/2021 0118  ? [place] NEGATIVE 08/08/2021 0118  ? [place] NEGATIVE 08/08/2021 0118  ? [place] NEGATIVE 08/08/2021 0118  ? PROTEINUR 30 (  A) 08/08/2021 0118  ? NITRITE NEGATIVE 08/08/2021 0118  ? LEUKOCYTESUR NEGATIVE 08/08/2021 0118  ? ?Sepsis Labs: ?@LABRCNTIP (procalcitonin:4,lacticidven:4) ?) ?Recent Results (from the past 240 hour(s))  ?Resp Panel by RT-PCR (Flu A&B, Covid) Nasopharyngeal Swab     Status: None  ? Collection Time: 08/07/21  5:50 PM  ? Specimen: Nasopharyngeal Swab; Nasopharyngeal(NP) swabs in vial transport medium  ?Result Value Ref Range Status  ? SARS Coronavirus 2 by RT PCR NEGATIVE NEGATIVE Final  ?  Comment: (NOTE) ?SARS-CoV-2 target nucleic acids are NOT DETECTED. ? ?The SARS-CoV-2 RNA is generally detectable in upper respiratory ?specimens during the acute phase of infection. The lowest ?concentration of SARS-CoV-2 viral copies this assay can detect is ?138 copies/mL. A negative result does not preclude SARS-Cov-2 ?infection and should not be used as the sole basis for treatment or ?other patient management decisions. A negative result may occur with  ?improper specimen collection/handling, submission of specimen other ?than nasopharyngeal swab, presence of  viral mutation(s) within the ?areas targeted by this assay, and inadequate number of viral ?copies(<138 copies/mL). A negative result must be combined with ?clinical observations, patient history, and e

## 2021-08-10 NOTE — Plan of Care (Signed)
?  Problem: Acute Rehab PT Goals(only PT should resolve) ?Goal: Pt Will Go Supine/Side To Sit ?Outcome: Progressing ?Flowsheets (Taken 08/10/2021 1604) ?Pt will go Supine/Side to Sit: ? with min guard assist ? with minimal assist ?Goal: Patient Will Transfer Sit To/From Stand ?Outcome: Progressing ?Flowsheets (Taken 08/10/2021 1604) ?Patient will transfer sit to/from stand: ? with min guard assist ? with minimal assist ?Goal: Pt Will Transfer Bed To Chair/Chair To Bed ?Outcome: Progressing ?Flowsheets (Taken 08/10/2021 1604) ?Pt will Transfer Bed to Chair/Chair to Bed: ? with min assist ? min guard assist ?Goal: Pt Will Ambulate ?Outcome: Progressing ?Flowsheets (Taken 08/10/2021 1604) ?Pt will Ambulate: ? 25 feet ? with minimal assist ? with moderate assist ? with rolling walker ?  ?4:04 PM, 08/10/21 ?Lonell Grandchild, MPT ?Physical Therapist with [place] ?[facility] ?[phone] office ?[phone] mobile phone ? ?

## 2021-08-10 NOTE — Progress Notes (Addendum)
RT was going to pull BIPAP machine out of room (because it looked like patient had not wore machine since 4/18) but the nightshift RN informed this RT that patient was placed on BIPAP several times during the day today and yesterday. This RT could not find any documentation that the patient was on BIPAP. Left unit at bedside if needed and told RN to call RT if patient needed BIPAP so that proper documentation could be done and we could keep track of how often patient is needing BIPAP. ?

## 2021-08-10 NOTE — Progress Notes (Signed)
*  PRELIMINARY RESULTS* ?Echocardiogram ?2D Echocardiogram has been performed. ? ?Carolyne Fiscal ?08/10/2021, 12:18 PM ?

## 2021-08-11 DIAGNOSIS — J9601 Acute respiratory failure with hypoxia: Secondary | ICD-10-CM | POA: Diagnosis not present

## 2021-08-11 DIAGNOSIS — D638 Anemia in other chronic diseases classified elsewhere: Secondary | ICD-10-CM | POA: Diagnosis not present

## 2021-08-11 DIAGNOSIS — G9341 Metabolic encephalopathy: Secondary | ICD-10-CM | POA: Diagnosis not present

## 2021-08-11 DIAGNOSIS — A419 Sepsis, unspecified organism: Secondary | ICD-10-CM | POA: Diagnosis not present

## 2021-08-11 LAB — BASIC METABOLIC PANEL
Anion gap: 10 (ref 5–15)
BUN: 27 mg/dL — ABNORMAL HIGH (ref 8–23)
CO2: 23 mmol/L (ref 22–32)
Calcium: 8.3 mg/dL — ABNORMAL LOW (ref 8.9–10.3)
Chloride: 105 mmol/L (ref 98–111)
Creatinine, Ser: 0.72 mg/dL (ref 0.44–1.00)
GFR, Estimated: >60 mL/min (ref >60)
Glucose, Bld: 90 mg/dL (ref 70–99)
Potassium: 3.6 mmol/L (ref 3.5–5.1)
Sodium: 138 mmol/L (ref 135–145)

## 2021-08-11 LAB — CBC
HCT: 25.8 % — ABNORMAL LOW (ref 36.0–46.0)
Hemoglobin: 7.9 g/dL — ABNORMAL LOW (ref 12.0–15.0)
MCH: 25.1 pg — ABNORMAL LOW (ref 26.0–34.0)
MCHC: 30.6 g/dL (ref 30.0–36.0)
MCV: 81.9 fL (ref 80.0–100.0)
Platelets: 286 10*3/uL (ref 150–400)
RBC: 3.15 MIL/uL — ABNORMAL LOW (ref 3.87–5.11)
RDW: 17.4 % — ABNORMAL HIGH (ref 11.5–15.5)
WBC: 15.9 10*3/uL — ABNORMAL HIGH (ref 4.0–10.5)
nRBC: 0 % (ref 0.0–0.2)

## 2021-08-11 LAB — MAGNESIUM: Magnesium: 1.8 mg/dL (ref 1.7–2.4)

## 2021-08-11 MED ORDER — MAGNESIUM OXIDE -MG SUPPLEMENT 400 (240 MG) MG PO TABS
400.0000 mg | ORAL_TABLET | Freq: Every day | ORAL | Status: DC
  Administered 2021-08-11 – 2021-08-12 (×2): 400 mg via ORAL
  Filled 2021-08-11 (×2): qty 1

## 2021-08-11 MED ORDER — DILTIAZEM HCL 30 MG PO TABS
30.0000 mg | ORAL_TABLET | Freq: Four times a day (QID) | ORAL | Status: DC
  Administered 2021-08-11 – 2021-08-12 (×5): 30 mg via ORAL
  Filled 2021-08-11 (×6): qty 1

## 2021-08-11 NOTE — Progress Notes (Signed)
?  ?       ?PROGRESS NOTE ? ?[name] BUL:[id number] DOB: Aug 25, 1940 DOA: 08/07/2021 ?PCP: Default, Provider, MD ? ?Brief History:  ?81 y.o. female with medical history significant for hypertension, atrial fibrillation, CVA. ?Patient was brought to the ED from nursing home with reports of difficulty breathing, O2 sats with 70% on room air, per EMS she was placed on 4 L O2 sat improved to 89%.  On my evaluation patient is awake and alert, agitated, speech is confused. ?I talked to patient's son who is power of attorney-[name].  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  If his mother's breathing gets worse, he would like her to be intubated.  She is a full code. ?  ?Patient was in the ED a month ago 3/17-COVID test was positive, chest x-ray showed pneumonia.  She was not septic and not hypoxic.  She was discharged to complete 5 days of cefdinir and azithromycin. ?  ?ED Course: Temperature 98.7.  Heart rate 102-114.  Respiratory rate 20-36.  Blood pressure systolic initially in the low 321Y now dropping to 83 systolic..  O2 sats had dropped to 89% on 3 L.  She was placed on BiPAP. ?WBC 23.9.  Potassium 2.8.  Lactic acid 1.5 > 2.6. ?VBG showed pH of 7.3, PCO2 of 43. ?0.5 mg of Ativan given without improvement in agitation. ?  ?2.7 L of fluid given.  IV ceftriaxone and azithromycin started. ?Hospitalist to admit. ?  ?Hospital Course: Patient's blood cultures have grown S. pneumoniae, so antibiotics narrowed to ceftriaxone. Respiratory status has improved, weaned off BiPAP to  2Lnasal cannula.  Weaned off levophed on 08/09/21 AM.  Afib RVR with HR in 150s on 4/20, so IV lopressor ordered and Echo ordered  ? ?Assessment/Plan: ? ? ?Principal Problem: ?  Septic shock (HCC) ?Active Problems: ?  Acute metabolic  encephalopathy ?  Pneumococcal pneumonia (HCC) ?  Acute respiratory failure with hypoxia (HCC) ?  Persistent atrial fibrillation (HCC) ?  Hypertension ?  Hypothyroidism ?  Hypokalemia ?  Pneumococcal bacteremia ?  Anemia of chronic disease ?  Hypomagnesemia ? ?Assessment and Plan: ?* Septic shock (HCC) ?Due to pneumococcal pneumonia with bacteremia.  ?-Required low dose levophed to maintain pressures despite IV fluid administration.  ?- Weaned off levophed am 08/09/21 ?-lactic acid peaked 2.8>>1.5 ?- continue ceftriaxone ? ?Acute metabolic encephalopathy ?secondary to multifocal pneumonia and severe sepsis.  ?-overall improved significantly from admission.  ?-now able to carry conversation and follow commands ? ?Pneumococcal pneumonia (HCC) ?- Continue ceftriaxone. MRSA negative ?-DC vancomycin. ?-urine streptococcus pneumoniae antigen positive ?-personally reviewed CXR--bilateral opacities ? ?Acute respiratory failure with hypoxia (HCC) ?- Transition off BiPAP since mentation improved and oxygenation maintained well with decreased FiO2. ?-now stable on 2L>>weaned to RA ? ?Persistent atrial fibrillation (HCC) ?-EKG shows atrial fibrillation.   ?-on Eliquis and carvedilol PTA ?-Hold carvedilol with hypotension initially ?-increase IV metoprolol in lieu of coreg due to RVR ?-restart apixaban ?-echo EF 50-55%, elevated PASP, mod TR ?-HR increases to 130-140 with any activity ?-add diltiazem po ? ?Hypomagnesemia ?repleted ? ?Anemia of chronic disease ?No bleeding noted.  ?- Recheck H/H with anemia panel, T&S with intention to transfuse if remains hypotensive with hgb < 8 or if hgb < 7. ?-iron saturation 7%,  ferritin 296 ?-B12 2062 ?-folate 9.9 ?-restarted ferrous sulfate ? ?Pneumococcal bacteremia ? - CTX 2g daily ?-follow repeat blood cultures--neg to date ? ?Hypokalemia ?- supplemented ?Mag 1.8 ? ?Hypothyroidism ?Restart synthroid ? ?Hypertension ?Holding ramipril and coreg due to hypotension initially ?-starting  metoprolol in lieu of coreg due to afib RVR ? ?Family Communication:   son updated 4/20 ?  ?Consultants:  none ?  ?Code Status:  FULL  ?  ?DVT Prophylaxis:  apixaban ?  ?  ?Procedures: ?As Listed in Progress Note Above ?  ?Antibiotics: ?Ceftriaxone 4/17>> ?  ? ? ?RN Pressure Injury Documentation: ?Pressure Injury 08/08/21 Sacrum Anterior Stage 2 -  Partial thickness loss of dermis presenting as a shallow open injury with a red, pink wound bed without slough. Redness (Active)  ?08/08/21 0845  ?Location: Sacrum  ?Location Orientation: Anterior  ?Staging: Stage 2 -  Partial thickness loss of dermis presenting as a shallow open injury with a red, pink wound bed without slough.  ?Wound Description (Comments): Redness  ?Present on Admission: Yes  ?Dressing Type Foam - Lift dressing to assess site every shift 08/11/21 0956  ? ? ? ? ? ? ?Subjective: ?Patient denies fevers, chills, headache, chest pain, dyspnea, nausea, vomiting, diarrhea, abdominal pain, dysuria, hematuria, ? ? ?Objective: ?Vitals:  ? 08/11/21 1000 08/11/21 1004 08/11/21 1026 08/11/21 1100  ?BP: (!) 126/94   138/86  ?Pulse: (!) 130 (!) 138 (!) 129 (!) 112  ?Resp: (!) 30 (!) 22 20 20   ?Temp:      ?TempSrc:      ?SpO2: (!) 89% 90% 91% 90%  ?Weight:      ?Height:      ? ? ?Intake/Output Summary (Last 24 hours) at 08/11/2021 1139 ?Last data filed at 08/11/2021 0900 ?Gross per 24 hour  ?Intake 369.72 ml  ?Output 350 ml  ?Net 19.72 ml  ? ?Weight change: -2.6 kg ?Exam: ? ?General:  Pt is alert, follows commands appropriately, not in acute distress ?HEENT: No icterus, No thrush, No neck mass, [place]/AT ?Cardiovascular: IRRR, S1/S2, no rubs, no gallops ?Respiratory:diminshed BS.  No wheeze ?Abdomen: Soft/+BS, non tender, non distended, no guarding ?Extremities: trace LE edema, No lymphangitis, No petechiae, No rashes, no synovitis ? ? ?Data Reviewed: ?I have personally reviewed following labs and imaging studies ?Basic Metabolic Panel: ?Recent Labs  ?Lab 08/07/21 ?1759  08/08/21 ?0410 08/09/21 ?0351 08/10/21 ?08/12/21 08/11/21 ?08/13/21  ?NA 130* 131* 134* 134* 138  ?K 2.8* 3.7 3.6 3.7 3.6  ?CL 95* 98 104 103 105  ?CO2 25 24 23 22 23   ?GLUCOSE 123* 127* 98 83 90  ?BUN 26* 25* 25* 23 27*  ?CREATININE 0.99 0.95 0.81 0.78 0.72  ?CALCIUM 7.9* 7.7* 8.1* 8.1* 8.3*  ?MG 1.1* 1.6* 2.1 1.9 1.8  ? ?Liver Function Tests: ?Recent Labs  ?Lab 08/07/21 ?1759  ?AST 23  ?ALT 13  ?ALKPHOS 102  ?BILITOT 1.2  ?PROT 6.4*  ?ALBUMIN 2.6*  ? ?No results for input(s): LIPASE, AMYLASE in the last 168 hours. ?No results for input(s): AMMONIA in the last 168 hours. ?Coagulation Profile: ?No results for input(s): INR, PROTIME in the last 168 hours. ?CBC: ?Recent Labs  ?Lab 08/07/21 ?1759 08/08/21 ?0410 08/08/21 ?1705 08/09/21 ?0351 08/10/21 ?08/11/21 08/11/21 ?7846  ?WBC 23.9* 25.6*  --  21.4* 19.2* 15.9*  ?NEUTROABS 22.8*  --   --   --   --   --   ?HGB 8.2* 7.5* 8.1* 8.0* 8.2* 7.9*  ?HCT 25.9* 23.4* 26.0* 25.9* 26.6* 25.8*  ?  MCV 82.0 82.4  --  82.2 83.1 81.9  ?PLT 258 234  --  304 264 286  ? ?Cardiac Enzymes: ?No results for input(s): CKTOTAL, CKMB, CKMBINDEX, TROPONINI in the last 168 hours. ?BNP: ?Invalid input(s): POCBNP ?CBG: ?Recent Labs  ?Lab 08/09/21 ?0726  ?GLUCAP 108*  ? ?HbA1C: ?No results for input(s): HGBA1C in the last 72 hours. ?Urine analysis: ?   ?Component Value Date/Time  ? COLORUR[place] YELLOW 08/08/2021 0118  ? APPEARANCEUR CLEAR 08/08/2021 0118  ? LABSPEC 1.015 08/08/2021 0118  ? PHURINE 5.0 08/08/2021 0118  ? GLUCOSEU NEGATIVE 08/08/2021 0118  ? HGBUR NEGATIVE 08/08/2021 0118  ? BILIRUBINUR NEGATIVE 08/08/2021 0118  ? KETONESUR NEGATIVE 08/08/2021 0118  ? PROTEINUR 30 (A) 08/08/2021 0118  ? NITRITE NEGATIVE 08/08/2021 0118  ? LEUKOCYTESUR NEGATIVE 08/08/2021 0118  ? ?Sepsis Labs: ?@[email or web address] (procalcitonin:4,lacticidven:4) ?) ?Recent Results (from the past 240 hour(s))  ?Resp Panel by RT-PCR (Flu A&B, Covid) Nasopharyngeal Swab     Status: None  ? Collection Time: 08/07/21  5:50 PM  ? Specimen:  Nasopharyngeal Swab; Nasopharyngeal(NP) swabs in vial transport medium  ?Result Value Ref Range Status  ? SARS Coronavirus 2 by RT PCR NEGATIVE NEGATIVE Final  ?  Comment: (NOTE) ?SARS-CoV-2 target nucleic acids are NOT DETE

## 2021-08-11 NOTE — TOC Progression Note (Signed)
Transition of Care (TOC) - Progression Note  ? ? ?Patient Details  ?Name: [name] ?MRN: [id number] ?Date of Birth: 04-21-1941 ? ?Transition of Care (TOC) CM/SW Contact  ?Kathryn Bleak, RN ?Phone Number: ?08/11/2021, 10:29 AM ? ?Clinical Narrative:   Review PT note with [name] at [facility], she is at baseline. She will accept back. Patient needing the weekend to triturate medications. Updated [name] discharge planning for Monday.  ? ?Expected Discharge Plan: Assisted Living ?Barriers to Discharge: Continued Medical Work up ? ?Expected Discharge Plan and Services ?Expected Discharge Plan: Assisted Living ?In-house Referral: Clinical Social Work ?  ?Post Acute Care Choice: Resumption of Svcs/PTA Provider ?Living arrangements for the past 2 months: Assisted Living Facility ?               ?  ? Readmission Risk Interventions ? ?  08/11/2021  ? 10:29 AM 08/09/2021  ?  2:34 PM  ?Readmission Risk Prevention Plan  ?Transportation Screening Complete Complete  ?PCP or Specialist Appt within 3-5 Days Complete   ?HRI or Home Care Consult Complete Complete  ?Social Work Consult for Recovery Care Planning/Counseling Complete Complete  ?Palliative Care Screening Not Applicable Not Applicable  ?Medication Review Oceanographer) Complete Complete  ? ? ?

## 2021-08-11 NOTE — Progress Notes (Signed)
Patient has been off BIPAP all night and done well. Unit still at bedside if needed. ?

## 2021-08-11 NOTE — Progress Notes (Signed)
When walking in the room patient had the nasal cannula on, but no O2 flow going through it. Oxygen saturations were stable anywhere from 91% and above on room air. I asked the patient if she was on any oxygen at home and she said no, she still thought she was on oxygen at the moment so I asked if she wanted to take the cannula off to see how she would do. She stated she felt she wasn't ready for that yet. Since patient's O2 sat's are stable, I just left the cannula on the patient for comfort, although no oxygen is flowing through it at the moment. Will continue to monitor.  ?

## 2021-08-12 DIAGNOSIS — G9341 Metabolic encephalopathy: Secondary | ICD-10-CM | POA: Diagnosis not present

## 2021-08-12 DIAGNOSIS — E876 Hypokalemia: Secondary | ICD-10-CM | POA: Diagnosis not present

## 2021-08-12 DIAGNOSIS — A419 Sepsis, unspecified organism: Secondary | ICD-10-CM | POA: Diagnosis not present

## 2021-08-12 DIAGNOSIS — J9601 Acute respiratory failure with hypoxia: Secondary | ICD-10-CM | POA: Diagnosis not present

## 2021-08-12 LAB — MAGNESIUM: Magnesium: 1.7 mg/dL (ref 1.7–2.4)

## 2021-08-12 LAB — BASIC METABOLIC PANEL
Anion gap: 8 (ref 5–15)
BUN: 21 mg/dL (ref 8–23)
CO2: 25 mmol/L (ref 22–32)
Calcium: 8.3 mg/dL — ABNORMAL LOW (ref 8.9–10.3)
Chloride: 106 mmol/L (ref 98–111)
Creatinine, Ser: 0.7 mg/dL (ref 0.44–1.00)
GFR, Estimated: >60 mL/min (ref >60)
Glucose, Bld: 104 mg/dL — ABNORMAL HIGH (ref 70–99)
Potassium: 3.4 mmol/L — ABNORMAL LOW (ref 3.5–5.1)
Sodium: 139 mmol/L (ref 135–145)

## 2021-08-12 MED ORDER — DILTIAZEM HCL 60 MG PO TABS
60.0000 mg | ORAL_TABLET | Freq: Four times a day (QID) | ORAL | Status: DC
  Administered 2021-08-12 – 2021-08-13 (×3): 60 mg via ORAL
  Filled 2021-08-12 (×2): qty 1

## 2021-08-12 MED ORDER — POTASSIUM CHLORIDE CRYS ER 20 MEQ PO TBCR
20.0000 meq | EXTENDED_RELEASE_TABLET | Freq: Once | ORAL | Status: AC
  Administered 2021-08-12: 20 meq via ORAL
  Filled 2021-08-12: qty 1

## 2021-08-12 MED ORDER — MAGNESIUM OXIDE -MG SUPPLEMENT 400 (240 MG) MG PO TABS
400.0000 mg | ORAL_TABLET | Freq: Two times a day (BID) | ORAL | Status: DC
Start: 2021-08-12 — End: 2021-08-14
  Administered 2021-08-12 – 2021-08-14 (×4): 400 mg via ORAL
  Filled 2021-08-12 (×4): qty 1

## 2021-08-12 NOTE — Progress Notes (Addendum)
?  ?       ?PROGRESS NOTE ? ?[name] [id number] DOB: 15-Jan-1941 DOA: 08/07/2021 ?PCP: Default, Provider, MD ? ?Brief History:  ?81 y.o. female with medical history significant for hypertension, atrial fibrillation, CVA. ?Patient was brought to the ED from nursing home with reports of difficulty breathing, O2 sats with 70% on room air, per EMS she was placed on 4 L O2 sat improved to 89%.  On my evaluation patient is awake and alert, agitated, speech is confused. ?I talked to patient's son who is power of attorney-[name].  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  If his mother's breathing gets worse, he would like her to be intubated.  She is a full code. ?  ?Patient was in the ED a month ago 3/17-COVID test was positive, chest x-ray showed pneumonia.  She was not septic and not hypoxic.  She was discharged to complete 5 days of cefdinir and azithromycin. ?  ?ED Course: Temperature 98.7.  Heart rate 102-114.  Respiratory rate 20-36.  Blood pressure systolic initially in the low 100s now dropping to 83 systolic..  O2 sats had dropped to 89% on 3 L.  She was placed on BiPAP. ?WBC 23.9.  Potassium 2.8.  Lactic acid 1.5 > 2.6. ?VBG showed pH of 7.3, PCO2 of 43. ?0.5 mg of Ativan given without improvement in agitation. ?  ?2.7 L of fluid given.  IV ceftriaxone and azithromycin started. ?Hospitalist to admit. ?  ?Hospital Course: Patient's blood cultures have grown S. pneumoniae, so antibiotics narrowed to ceftriaxone. Respiratory status has improved, weaned off BiPAP to  2Lnasal cannula.  Weaned off levophed on 08/09/21 AM.  Afib RVR with HR in 150s on 4/20, so IV lopressor ordered and Echo ordered  ? ? ? ?Assessment and Plan: ?* Septic shock ([place]) ?Due to pneumococcal pneumonia with bacteremia.  ?-Required  low dose levophed to maintain pressures despite IV fluid administration.  ?- Weaned off levophed am 08/09/21 ?-lactic acid peaked 2.8>>1.5 ?- continue ceftriaxone ? ?Acute metabolic encephalopathy ?secondary to multifocal pneumonia and severe sepsis.  ?-overall improved significantly from admission.  ?-now able to carry conversation and follow commands ?-pleasantly confused ? ?Pneumococcal pneumonia ([place]) ?- Continue ceftriaxone. MRSA negative ?-DC vancomycin. ?-urine streptococcus pneumoniae antigen positive ?-personally reviewed CXR--bilateral opacities ? ?Acute respiratory failure with hypoxia ([place]) ?- Transition off BiPAP since mentation improved and oxygenation maintained well with decreased FiO2. ?-now stable on 2L>>weaned to RA ? ?Persistent atrial fibrillation ([place]) ?-EKG shows atrial fibrillation.   ?-on Eliquis and carvedilol PTA ?-Hold carvedilol with hypotension initially ?-increase IV metoprolol in lieu of coreg due to RVR ?-restart apixaban ?-echo EF 50-55%, elevated PASP, mod TR ?-HR increases to 130-140 with any activity ?-add diltiazem po>>increase to 60 mg q 6 hours ? ?Hypomagnesemia ?repleted ?Increase mag ox to bid ? ?Anemia of chronic disease ?No bleeding noted.  ?- Recheck H/H with anemia panel, T&S with intention to transfuse if remains hypotensive with hgb < 8 or if hgb < 7. ?-iron saturation 7%, ferritin 296 ?-B12 2062 ?-folate 9.9 ?-restarted ferrous sulfate ? ?Pneumococcal bacteremia ? - CTX 2g daily ?-follow repeat blood cultures--neg to date ? ?Hypokalemia ?- supplemented ?Mag 1.8 ? ?Hypothyroidism ?Restart synthroid ? ?Hypertension ?Holding ramipril and coreg due to hypotension initially ?-starting  metoprolol in lieu of coreg due to afib RVR ? ? ?Family Communication:   son updated 4/20 ?  ?Consultants:  none ?  ?Code Status:  FULL  ?  ?DVT Prophylaxis:  apixaban ?  ?  ?Procedures: ?As Listed in Progress Note Above ?  ?Antibiotics: ?Ceftriaxone 4/17>> ?  ? ? ? ? ?RN Pressure Injury  Documentation: ?Pressure Injury 08/08/21 Sacrum Anterior Stage 2 -  Partial thickness loss of dermis presenting as a shallow open injury with a red, pink wound bed without slough. Redness (Active)  ?08/08/21 0845  ?Location: Sacrum  ?Location Orientation: Anterior  ?Staging: Stage 2 -  Partial thickness loss of dermis presenting as a shallow open injury with a red, pink wound bed without slough.  ?Wound Description (Comments): Redness  ?Present on Admission: Yes  ?Dressing Type Foam - Lift dressing to assess site every shift 08/12/21 0820  ? ? ? ? ? ? ?Subjective: ?Patient denies fevers, chills, headache, chest pain, dyspnea, nausea, vomiting, diarrhea, abdominal pain, ? ? ?Objective: ?Vitals:  ? 08/12/21 1502 08/12/21 1605 08/12/21 1630 08/12/21 1700  ?BP: (!) 147/77 (!) 157/84  (!) 156/100  ?Pulse:  73  (!) 102  ?Resp: (!) 22 (!) 23  (!) 22  ?Temp:   98 ?F (36.7 ?C)   ?TempSrc:   Oral   ?SpO2: 98% 92%  94%  ?Weight:      ?Height:      ? ? ?Intake/Output Summary (Last 24 hours) at 08/12/2021 1803 ?Last data filed at 08/11/2021 2341 ?Gross per 24 hour  ?Intake 150 ml  ?Output --  ?Net 150 ml  ? ?Weight change:  ?Exam: ? ?General:  Pt is alert, follows commands appropriately, not in acute distress ?HEENT: No icterus, No thrush, No neck mass, [place]/AT ?Cardiovascular: IRRR, S1/S2, no rubs, no gallops ?Respiratory: bibasilar rales L>R ?Abdomen: Soft/+BS, non tender, non distended, no guarding ?Extremities: No edema, No lymphangitis, No petechiae, No rashes, no synovitis ? ? ?Data Reviewed: ?I have personally reviewed following labs and imaging studies ?Basic Metabolic Panel: ?Recent Labs  ?Lab 08/08/21 ?0410 08/09/21 ?0351 08/10/21 ?0352 08/11/21 ?[id number] 08/12/21 ?[id number]  ?NA 131* 134* 134* 138 139  ?K 3.7 3.6 3.7 3.6 3.4*  ?CL 98 104 103 105 106  ?CO2 24 23 22 23 25   ?GLUCOSE 127* 98 83 90 104*  ?BUN 25* 25* 23 27* 21  ?CREATININE 0.95 0.81 0.78 0.72 0.70  ?CALCIUM 7.7* 8.1* 8.1* 8.3* 8.3*  ?MG 1.6* 2.1 1.9 1.8 1.7  ? ?Liver  Function Tests: ?Recent Labs  ?Lab 08/07/21 ?1759  ?AST 23  ?ALT 13  ?ALKPHOS 102  ?BILITOT 1.2  ?PROT 6.4*  ?ALBUMIN 2.6*  ? ?No results for input(s): LIPASE, AMYLASE in the last 168 hours. ?No results for input(s): AMMONIA in the last 168 hours. ?Coagulation Profile: ?No results for input(s): INR, PROTIME in the last 168 hours. ?CBC: ?Recent Labs  ?Lab 08/07/21 ?1759 08/08/21 ?0410 08/08/21 ?1705 08/09/21 ?0351 08/10/21 ?[id number] 08/11/21 ?[id number]  ?WBC 23.9* 25.6*  --  21.4* 19.2* 15.9*  ?NEUTROABS 22.8*  --   --   --   --   --   ?HGB 8.2* 7.5* 8.1* 8.0* 8.2* 7.9*  ?HCT 25.9* 23.4* 26.0* 25.9* 26.6* 25.8*  ?MCV 82.0 82.4  --  82.2 83.1 81.9  ?PLT 258 234  --  304 264 286  ? ?Cardiac Enzymes: ?No results for input(s): CKTOTAL, CKMB, CKMBINDEX, TROPONINI in the last 168 hours. ?BNP: ?Invalid input(s): POCBNP ?CBG: ?Recent Labs  ?  Lab 08/09/21 ?0726  ?GLUCAP 108*  ? ?HbA1C: ?No results for input(s): HGBA1C in the last 72 hours. ?Urine analysis: ?   ?Component Value Date/Time  ? [place] YELLOW 08/08/2021 0118  ? APPEARANCEUR CLEAR 08/08/2021 0118  ? LABSPEC 1.015 08/08/2021 0118  ? PHURINE 5.0 08/08/2021 0118  ? GLUCOSEU NEGATIVE 08/08/2021 0118  ? [place] NEGATIVE 08/08/2021 0118  ? [place] NEGATIVE 08/08/2021 0118  ? [place] NEGATIVE 08/08/2021 0118  ? PROTEINUR 30 (A) 08/08/2021 0118  ? NITRITE NEGATIVE 08/08/2021 0118  ? LEUKOCYTESUR NEGATIVE 08/08/2021 0118  ? ?Sepsis Labs: ?[email or web address] (procalcitonin:4,lacticidven:4) ?) ?Recent Results (from the past 240 hour(s))  ?Resp Panel by RT-PCR (Flu A&B, Covid) Nasopharyngeal Swab     Status: None  ? Collection Time: 08/07/21  5:50 PM  ? Specimen: Nasopharyngeal Swab; Nasopharyngeal(NP) swabs in vial transport medium  ?Result Value Ref Range Status  ? SARS Coronavirus 2 by RT PCR NEGATIVE NEGATIVE Final  ?  Comment: (NOTE) ?SARS-CoV-2 target nucleic acids are NOT DETECTED. ? ?The SARS-CoV-2 RNA is generally detectable in upper respiratory ?specimens during the acute phase  of infection. The lowest ?concentration of SARS-CoV-2 viral copies this assay can detect is ?138 copies/mL. A negative result does not preclude SARS-Cov-2 ?infection and should not be used as the sole basis f

## 2021-08-13 DIAGNOSIS — R7881 Bacteremia: Secondary | ICD-10-CM | POA: Diagnosis not present

## 2021-08-13 DIAGNOSIS — G9341 Metabolic encephalopathy: Secondary | ICD-10-CM | POA: Diagnosis not present

## 2021-08-13 DIAGNOSIS — J9601 Acute respiratory failure with hypoxia: Secondary | ICD-10-CM | POA: Diagnosis not present

## 2021-08-13 DIAGNOSIS — A419 Sepsis, unspecified organism: Secondary | ICD-10-CM | POA: Diagnosis not present

## 2021-08-13 LAB — BASIC METABOLIC PANEL
Anion gap: 6 (ref 5–15)
BUN: 19 mg/dL (ref 8–23)
CO2: 28 mmol/L (ref 22–32)
Calcium: 8.3 mg/dL — ABNORMAL LOW (ref 8.9–10.3)
Chloride: 106 mmol/L (ref 98–111)
Creatinine, Ser: 0.57 mg/dL (ref 0.44–1.00)
GFR, Estimated: >60 mL/min (ref >60)
Glucose, Bld: 100 mg/dL — ABNORMAL HIGH (ref 70–99)
Potassium: 3.7 mmol/L (ref 3.5–5.1)
Sodium: 140 mmol/L (ref 135–145)

## 2021-08-13 LAB — CBC
HCT: 26.8 % — ABNORMAL LOW (ref 36.0–46.0)
Hemoglobin: 8.4 g/dL — ABNORMAL LOW (ref 12.0–15.0)
MCH: 26 pg (ref 26.0–34.0)
MCHC: 31.3 g/dL (ref 30.0–36.0)
MCV: 83 fL (ref 80.0–100.0)
Platelets: 296 10*3/uL (ref 150–400)
RBC: 3.23 MIL/uL — ABNORMAL LOW (ref 3.87–5.11)
RDW: 17.5 % — ABNORMAL HIGH (ref 11.5–15.5)
WBC: 15.3 10*3/uL — ABNORMAL HIGH (ref 4.0–10.5)
nRBC: 0.1 % (ref 0.0–0.2)

## 2021-08-13 LAB — MAGNESIUM: Magnesium: 1.8 mg/dL (ref 1.7–2.4)

## 2021-08-13 MED ORDER — DILTIAZEM HCL ER COATED BEADS 240 MG PO CP24
240.0000 mg | ORAL_CAPSULE | Freq: Every day | ORAL | Status: DC
  Administered 2021-08-13 – 2021-08-14 (×2): 240 mg via ORAL
  Filled 2021-08-13 (×2): qty 1

## 2021-08-13 MED ORDER — LEVALBUTEROL HCL 0.63 MG/3ML IN NEBU: 0.6300 mg | INHALATION_SOLUTION | Freq: Four times a day (QID) | RESPIRATORY_TRACT | Status: DC | PRN

## 2021-08-13 NOTE — Discharge Summary (Addendum)
?Physician Discharge Summary ?  ?Patient: [name] MRN: [id number] DOB: Apr 06, 1941  ?Admit date:     08/07/2021  ?Discharge date: 08/14/2021  ?Discharge Physician: Shanon Brow Denora Wysocki  ? ?PCP: Default, Provider, MD  ? ?Recommendations at discharge:  ? Please follow up with primary care provider within 1-2 weeks ? Please repeat BMP and CBC and mag in one week ? ? ?Hospital Course: ?81 y.o. female with medical history significant for hypertension, atrial fibrillation, CVA. ?Patient was brought to the ED from nursing home with reports of difficulty breathing, O2 sats with 70% on room air, per EMS she was placed on 4 L O2 sat improved to 89%.  On my evaluation patient is awake and alert, agitated, speech is confused. ?I talked to patient's son who is power of attorney-[name].  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  If his mother's breathing gets worse, he would like her to be intubated.  She is a full code. ?  ?Patient was in the ED a month ago 3/17-COVID test was positive, chest x-ray showed pneumonia.  She was not septic and not hypoxic.  She was discharged to complete 5 days of cefdinir and azithromycin. ?  ?ED Course: Temperature 98.7.  Heart rate 102-114.  Respiratory rate 20-36.  Blood pressure systolic initially in the low 100s now dropping to 83 systolic..  O2 sats had dropped to 89% on 3 L.  She was placed on BiPAP. ?WBC 23.9.  Potassium 2.8.  Lactic acid 1.5 > 2.6. ?VBG showed pH of 7.3, PCO2 of 43. ?0.5 mg of Ativan given without improvement in agitation. ?  ?2.7 L of fluid given.  IV ceftriaxone and azithromycin started. ?Hospitalist to admit. ?  ?Hospital Course: Patient's blood cultures have grown S. pneumoniae, so antibiotics narrowed to ceftriaxone. Respiratory status has improved, weaned off BiPAP to   2Lnasal cannula.  Weaned off levophed on 08/09/21 AM.  Afib RVR with HR in 150s on 4/20, so IV lopressor ordered and Echo ordered.  HRs remained elevated with IV lopressor titration, so diltiazem was started. ? ?Assessment and Plan: ?* Septic shock ([place]) ?Due to pneumococcal pneumonia with bacteremia.  ?-Required low dose levophed to maintain pressures despite IV fluid administration.  ?- Weaned off levophed am 08/09/21 ?-lactic acid peaked 2.8>>1.5 ?- continued ceftriaxone during hospitalization ?-sepsis physiology resolved ? ?Pneumococcal pneumonia ([place]) ?-Continue ceftriaxone. MRSA negative ?-DC vancomycin. ?-urine streptococcus pneumoniae antigen positive ?-personally reviewed CXR--bilateral opacities ? ?Acute respiratory failure with hypoxia ([place]) ?- Transition off BiPAP since mentation improved and oxygenation maintained well with decreased FiO2. ?-now stable on 3L with saturation 94-96% ?D/c home with 3L [place] ? ?Persistent atrial fibrillation ([place]) ?-EKG shows atrial fibrillation.   ?-on Eliquis and carvedilol PTA ?-Hold carvedilol with hypotension initially ?-started IV metoprolol in lieu of coreg due to RVR>>d/c home with metoprolol 75 mg bid ?-restart apixaban ?-echo EF 50-55%, elevated PASP, mod TR ?-add diltiazem po>>increase to 60 mg q 6 hours>>change to cardizem CD 240  ? ?Acute metabolic encephalopathy ?secondary to multifocal pneumonia and severe sepsis.  ?-overall improved significantly from admission.  ?-now able to carry conversation and follow commands ?-son states pt is near baseline ? ?Hypomagnesemia ?repleted ?Increased mag ox to bid ? ?Anemia of chronic disease ?No bleeding noted.  ?- Recheck H/H  with anemia panel, T&S with intention to transfuse if remains hypotensive with hgb < 8 or if hgb < 7. ?-iron saturation 7%, ferritin 296 ?-B12 2062 ?-folate 9.9 ?-restarted ferrous sulfate ? ?Pneumococcal bacteremia ? - CTX 2g daily ?-follow repeat blood cultures--neg to date ?D/c home with 3 more days  cefdinir to complete 10 days of therapy ? ?Hypokalemia ?- supplemented ?Mag 1.8 ? ?Hypothyroidism ?Restart synthroid ? ?Hypertension ?Holding ramipril and coreg due to hypotension initially ?-starting metoprolol in lieu of coreg due to afib RVR ?-now on diltiazem for afib ?-restart ramipril ? ? ? ? ?  ? ? ?Consultants: none ?Procedures performed: none  ?Disposition: Home ?Diet recommendation:  ?Cardiac diet ?DISCHARGE MEDICATION: ?Allergies as of 08/14/2021   ? ?   Reactions  ? Sulfa Antibiotics Other (See Comments)  ? Don't remember reaction  ? ?  ? ?  ?Medication List  ?  ? ?STOP taking these medications   ? ?azithromycin 250 MG tablet ?Commonly known as: Zithromax Z-Pak ?  ?carvedilol 12.5 MG tablet ?Commonly known as: COREG ?  ?mupirocin ointment 2 % ?Commonly known as: BACTROBAN ?  ? ?  ? ?TAKE these medications   ? ?apixaban 5 MG Tabs tablet ?Commonly known as: ELIQUIS ?Take 5 mg by mouth 2 (two) times daily. ?  ?aspirin EC 81 MG tablet ?Take 81 mg by mouth daily. ?  ?atorvastatin 20 MG tablet ?Commonly known as: LIPITOR ?Take 20 mg by mouth daily. ?  ?Biofreeze 4 % Gel ?Generic drug: Menthol (Topical Analgesic) ?Apply 1 application topically See admin instructions. Apply to right shoulder twice a day ?  ?calcium carbonate 500 MG chewable tablet ?Commonly known as: TUMS - dosed in mg elemental calcium ?Chew 1 tablet by mouth 2 (two) times daily with a meal. ?  ?cefdinir 300 MG capsule ?Commonly known as: OMNICEF ?Take 1 capsule (300 mg total) by mouth every 12 (twelve) hours. ?Start taking on: August 15, 2021 ?What changed: when to take this ?  ?cholecalciferol 25 MCG (1000 UNIT) tablet ?Commonly known as: VITAMIN D3 ?Take 1,000 Units by mouth daily. ?  ?diltiazem 240 MG 24 hr capsule ?Commonly known as: CARDIZEM CD ?Take 1 capsule (240 mg total) by mouth daily. ?  ?escitalopram 5 MG tablet ?Commonly known as: LEXAPRO ?Take 2.5 mg by mouth at bedtime. ?  ?ferrous sulfate 325 (65 FE) MG tablet ?Take 325 mg by  mouth daily with breakfast. ?  ?furosemide 20 MG tablet ?Commonly known as: LASIX ?Take 20 mg by mouth daily. ?  ?Levothyroxine Sodium 75 MCG Caps ?Take 75 mcg by mouth daily before breakfast. ?  ?magnesium oxide 400 (240 Mg) MG tablet ?Commonly known as: MAG-OX ?Take 1 tablet (400 mg total) by mouth 2 (two) times daily. ?  ?Metoprolol Tartrate 75 MG Tabs ?Take 75 mg by mouth 2 (two) times daily. ?  ?mirtazapine 15 MG tablet ?Commonly known as: REMERON ?Take 15 mg by mouth at bedtime. ?  ?omeprazole 20 MG capsule ?Commonly known as: PRILOSEC ?Take 20 mg by mouth daily. ?  ?promethazine 12.5 MG tablet ?Commonly known as: PHENERGAN ?Take 12.5 mg by mouth daily as needed for nausea or vomiting. ?  ?ramipril 2.5 MG capsule ?Commonly known as: ALTACE ?Take 1 capsule (2.5 mg total) by mouth daily. ?  ?SYSTANE BALANCE OP ?Place 1 drop into both eyes 2 (two) times daily. ?  ?vitamin B-12 500 MCG tablet ?Commonly known as: CYANOCOBALAMIN ?Take 500 mcg by mouth daily. ?  ? ?  ? ?  ?  ? ? ?  ?  Durable Medical Equipment  ?(From admission, onward)  ?  ? ? ?  ? ?  Start     Ordered  ? 08/14/21 0802  For home use only DME oxygen  Once       ?Question Answer Comment  ?Length of Need 6 Months   ?Mode or (Route) Nasal cannula   ?Liters per Minute 3   ?Frequency Continuous (stationary and portable oxygen unit needed)   ?Oxygen conserving device Yes   ?Oxygen delivery system Gas   ?  ? 08/14/21 0801  ? ?  ?  ? ?  ? ? ?Discharge Exam: ?Filed Weights  ? 08/08/21 0835 08/10/21 0519 08/11/21 0600  ?Weight: 62.8 kg 65.7 kg 63.1 kg  ? ?HEENT:  [place]/AT, No thrush, no icterus ?CV:  IRRR, no rub, no S3, no S4 ?Lung:  bibasilar rales, R>L ?Abd:  soft/+BS, NT ?Ext:  No edema, no lymphangitis, no synovitis, no rash ? ? ?Condition at discharge: stable ? ?The results of significant diagnostics from this hospitalization (including imaging, microbiology, ancillary and laboratory) are listed below for reference.  ? ?Imaging Studies: ?DG Chest 2  View ? ?Result Date: 08/07/2021 ?CLINICAL DATA:  Short of breath, atrial fibrillation, weakness EXAM: CHEST - 2 VIEW COMPARISON:  07/07/2021 FINDINGS: Frontal and lateral views of the chest demonstrate stable enlargem

## 2021-08-13 NOTE — Progress Notes (Signed)
?  ?       ?PROGRESS NOTE ? ?Marsh DollySharon Cueto [id number] DOB: 10/30/1940 DOA: 08/07/2021 ?PCP: Default, Provider, MD ? ?Brief History:  ?81 y.o. female with medical history significant for hypertension, atrial fibrillation, CVA. ?Patient was brought to the ED from nursing home with reports of difficulty breathing, O2 sats with 70% on room air, per EMS she was placed on 4 L O2 sat improved to 89%.  On my evaluation patient is awake and alert, agitated, speech is confused. ?I talked to patient's son who is power of attorney-[name].  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  If his mother's breathing gets worse, he would like her to be intubated.  She is a full code. ?  ?Patient was in the ED a month ago 3/17-COVID test was positive, chest x-ray showed pneumonia.  She was not septic and not hypoxic.  She was discharged to complete 5 days of cefdinir and azithromycin. ?  ?ED Course: Temperature 98.7.  Heart rate 102-114.  Respiratory rate 20-36.  Blood pressure systolic initially in the low [id number] now dropping to 83 systolic..  O2 sats had dropped to 89% on 3 L.  She was placed on BiPAP. ?WBC 23.9.  Potassium 2.8.  Lactic acid 1.5 > 2.6. ?VBG showed pH of 7.3, PCO2 of 43. ?0.5 mg of Ativan given without improvement in agitation. ?  ?2.7 L of fluid given.  IV ceftriaxone and azithromycin started. ?Hospitalist to admit. ?  ?Hospital Course: Patient's blood cultures have grown S. pneumoniae, so antibiotics narrowed to ceftriaxone. Respiratory status has improved, weaned off BiPAP to  2Lnasal cannula.  Weaned off levophed on 08/09/21 AM.  Afib RVR with HR in 150s on 4/20, so IV lopressor ordered and Echo ordered.  HRs remained elevated with IV lopressor titration, so diltiazem was started.  ? ? ?Assessment and Plan: ?*  Septic shock ([facility]) ?Due to pneumococcal pneumonia with bacteremia.  ?-Required low dose levophed to maintain pressures despite IV fluid administration.  ?- Weaned off levophed am 08/09/21 ?-lactic acid peaked 2.8>>1.5 ?- continue ceftriaxone ?-sepsis physiology resolved ? ?Pneumococcal pneumonia ([facility]) ?- Continue ceftriaxone. MRSA negative ?-DC vancomycin. ?-urine streptococcus pneumoniae antigen positive ?-personally reviewed CXR--bilateral opacities ? ?Acute respiratory failure with hypoxia ([facility]) ?- Transition off BiPAP since mentation improved and oxygenation maintained well with decreased FiO2. ?-now stable on 2L with saturation 94-96% ? ?Persistent atrial fibrillation ([facility]) ?-EKG shows atrial fibrillation.   ?-on Eliquis and carvedilol PTA ?-Hold carvedilol with hypotension initially ?-started IV metoprolol in lieu of coreg due to RVR ?-restart apixaban ?-echo EF 50-55%, elevated PASP, mod TR ?-add diltiazem po>>increase to 60 mg q 6 hours>>change to cardizem CD 240  ? ?Acute metabolic encephalopathy ?secondary to multifocal pneumonia and severe sepsis.  ?-overall improved significantly from admission.  ?-now able to carry conversation and follow commands ?-pleasantly confused ? ?Hypomagnesemia ?repleted ?Increased mag ox to bid ? ?Anemia of chronic disease ?No bleeding noted.  ?- Recheck H/H with anemia panel, T&S with intention to transfuse if remains hypotensive with hgb < 8 or if hgb < 7. ?-iron saturation 7%, ferritin 296 ?-B12 2062 ?-folate 9.9 ?-restarted ferrous sulfate ? ?Pneumococcal bacteremia ? - CTX 2g daily ?-follow repeat blood cultures--neg to date ? ?Hypokalemia ?- supplemented ?Mag 1.8 ? ?Hypothyroidism ?  Restart synthroid ? ?Hypertension ?Holding ramipril and coreg due to hypotension initially ?-starting metoprolol in lieu of coreg due to afib RVR ?-now on diltiazem for afib ? ? ? ? ? ? ? ? ? ?Family Communication:   son updated 4/23 ? ?Consultants:  none ? ?Code Status:  FULL  ? ?DVT  Prophylaxis:  apixaban ? ? ?Procedures: ?As Listed in Progress Note Above ? ?Antibiotics: ?Ceftriaxone 4/17>> ? ?RN Pressure Injury Documentation: ?Pressure Injury 08/08/21 Sacrum Anterior Stage 2 -  Partial thickness loss of dermis presenting as a shallow open injury with a red, pink wound bed without slough. Redness (Active)  ?08/08/21 0845  ?Location: Sacrum  ?Location Orientation: Anterior  ?Staging: Stage 2 -  Partial thickness loss of dermis presenting as a shallow open injury with a red, pink wound bed without slough.  ?Wound Description (Comments): Redness  ?Present on Admission: Yes  ?Dressing Type Foam - Lift dressing to assess site every shift 08/13/21 0729  ? ? ? ? ? ? ?Subjective: ?Patient denies fevers, chills, headache, chest pain, dyspnea, nausea, vomiting, diarrhea, abdominal pain, dysuria, hematuria, hematochezia, and melena. ? ? ?Objective: ?Vitals:  ? 08/13/21 0700 08/13/21 0729 08/13/21 0800 08/13/21 0900  ?BP: 121/72  (!) 124/57 119/63  ?Pulse: 87 91 (!) 103 (!) 45  ?Resp: (!) 22 (!) 26 19 (!) 26  ?Temp:  (!) 97.4 ?F (36.3 ?C)    ?TempSrc:  Oral    ?SpO2: 97% 93% 92% 93%  ?Weight:      ?Height:      ? ?No intake or output data in the 24 hours ending 08/13/21 0957 ?Weight change:  ?Exam: ? ?General:  Pt is alert, follows commands appropriately, not in acute distress ?HEENT: No icterus, No thrush, No neck mass, [place]/AT ?Cardiovascular: IRRR, S1/S2, no rubs, no gallops ?Respiratory: bibasilar rales; no wheeze ?Abdomen: Soft/+BS, non tender, non distended, no guarding ?Extremities: No edema, No lymphangitis, No petechiae, No rashes, no synovitis ? ? ?Data Reviewed: ?I have personally reviewed following labs and imaging studies ?Basic Metabolic Panel: ?Recent Labs  ?Lab 08/09/21 ?0351 08/10/21 ?0352 08/11/21 ?4944 08/12/21 ?9675 08/13/21 ?9163  ?NA 134* 134* 138 139 140  ?K 3.6 3.7 3.6 3.4* 3.7  ?CL 104 103 105 106 106  ?CO2 23 22 23 25 28   ?GLUCOSE 98 83 90 104* 100*  ?BUN 25* 23 27* 21 19   ?CREATININE 0.81 0.78 0.72 0.70 0.57  ?CALCIUM 8.1* 8.1* 8.3* 8.3* 8.3*  ?MG 2.1 1.9 1.8 1.7 1.8  ? ?Liver Function Tests: ?Recent Labs  ?Lab 08/07/21 ?1759  ?AST 23  ?ALT 13  ?ALKPHOS 102  ?BILITOT 1.2  ?PROT 6.4*  ?ALBUMIN 2.6*  ? ?No results for input(s): LIPASE, AMYLASE in the last 168 hours. ?No results for input(s): AMMONIA in the last 168 hours. ?Coagulation Profile: ?No results for input(s): INR, PROTIME in the last 168 hours. ?CBC: ?Recent Labs  ?Lab 08/07/21 ?1759 08/08/21 ?0410 08/08/21 ?1705 08/09/21 ?0351 08/10/21 ?08/12/21 08/11/21 ?08/13/21 08/13/21 ?08/15/21  ?WBC 23.9* 25.6*  --  21.4* 19.2* 15.9* 15.3*  ?NEUTROABS 22.8*  --   --   --   --   --   --   ?HGB 8.2* 7.5* 8.1* 8.0* 8.2* 7.9* 8.4*  ?HCT 25.9* 23.4* 26.0* 25.9* 26.6* 25.8* 26.8*  ?MCV 82.0 82.4  --  82.2 83.1 81.9 83.0  ?PLT 258 234  --  304 264 286 296  ? ?Cardiac Enzymes: ?No results for input(s): CKTOTAL, CKMB, CKMBINDEX, TROPONINI in the last  168 hours. ?BNP: ?Invalid input(s): POCBNP ?CBG: ?Recent Labs  ?Lab 08/09/21 ?0726  ?GLUCAP 108*  ? ?HbA1C: ?No results for input(s): HGBA1C in the last 72 hours. ?Urine analysis: ?   ?Component Value Date/Time  ? COLORURINE YELLOW 08/08/2021 0118  ? APPEARANCEUR CLEAR 08/08/2021 0118  ? LABSPEC 1.015 08/08/2021 0118  ? PHURINE 5.0 08/08/2021 0118  ? GLUCOSEU NEGATIVE 08/08/2021 0118  ? HGBUR NEGATIVE 08/08/2021 0118  ? BILIRUBINUR NEGATIVE 08/08/2021 0118  ? KETONESUR NEGATIVE 08/08/2021 0118  ? PROTEINUR 30 (A) 08/08/2021 0118  ? NITRITE NEGATIVE 08/08/2021 0118  ? LEUKOCYTESUR NEGATIVE 08/08/2021 0118  ? ?Sepsis Labs: ?@[email or web address] (procalcitonin:4,lacticidven:4) ?) ?Recent Results (from the past 240 hour(s))  ?Resp Panel by RT-PCR (Flu A&B, Covid) Nasopharyngeal Swab     Status: None  ? Collection Time: 08/07/21  5:50 PM  ? Specimen: Nasopharyngeal Swab; Nasopharyngeal(NP) swabs in vial transport medium  ?Result Value Ref Range Status  ? SARS Coronavirus 2 by RT PCR NEGATIVE NEGATIVE Final  ?  Comment:  (NOTE) ?SARS-CoV-2 target nucleic acids are NOT DETECTED. ? ?The SARS-CoV-2 RNA is generally detectable in upper respiratory ?specimens during the acute phase of infection. The lowest ?concentration of SARS-CoV-2 viral copies th

## 2021-08-14 DIAGNOSIS — G9341 Metabolic encephalopathy: Secondary | ICD-10-CM | POA: Diagnosis not present

## 2021-08-14 DIAGNOSIS — A419 Sepsis, unspecified organism: Secondary | ICD-10-CM | POA: Diagnosis not present

## 2021-08-14 DIAGNOSIS — J9601 Acute respiratory failure with hypoxia: Secondary | ICD-10-CM | POA: Diagnosis not present

## 2021-08-14 DIAGNOSIS — I48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 atrial fibrillation: Secondary | ICD-10-CM | POA: Diagnosis not present

## 2021-08-14 LAB — BASIC METABOLIC PANEL
Anion gap: 9 (ref 5–15)
BUN: 16 mg/dL (ref 8–23)
CO2: 27 mmol/L (ref 22–32)
Calcium: 8.6 mg/dL — ABNORMAL LOW (ref 8.9–10.3)
Chloride: 106 mmol/L (ref 98–111)
Creatinine, Ser: 0.67 mg/dL (ref 0.44–1.00)
GFR, Estimated: >60 mL/min (ref >60)
Glucose, Bld: 114 mg/dL — ABNORMAL HIGH (ref 70–99)
Potassium: 4 mmol/L (ref 3.5–5.1)
Sodium: 142 mmol/L (ref 135–145)

## 2021-08-14 LAB — MAGNESIUM: Magnesium: 1.8 mg/dL (ref 1.7–2.4)

## 2021-08-14 MED ORDER — SODIUM CHLORIDE 0.9 % IV SOLN
2.0000 g | INTRAVENOUS | Status: DC
  Administered 2021-08-14: 2 g via INTRAVENOUS
  Filled 2021-08-14: qty 20

## 2021-08-14 MED ORDER — CEFDINIR 300 MG PO CAPS
300.0000 mg | ORAL_CAPSULE | Freq: Two times a day (BID) | ORAL | Status: DC
Start: 2021-08-15 — End: 2021-08-14

## 2021-08-14 MED ORDER — METOPROLOL TARTRATE 75 MG PO TABS: 75.0000 mg | ORAL_TABLET | Freq: Two times a day (BID) | ORAL | 1 refills | Status: DC

## 2021-08-14 MED ORDER — METOPROLOL TARTRATE 50 MG PO TABS
75.0000 mg | ORAL_TABLET | Freq: Two times a day (BID) | ORAL | Status: DC
  Administered 2021-08-14: 75 mg via ORAL
  Filled 2021-08-14: qty 1

## 2021-08-14 MED ORDER — MAGNESIUM OXIDE -MG SUPPLEMENT 400 (240 MG) MG PO TABS: 400.0000 mg | ORAL_TABLET | Freq: Two times a day (BID) | ORAL | 1 refills | Status: DC

## 2021-08-14 MED ORDER — DILTIAZEM HCL ER COATED BEADS 240 MG PO CP24: 240.0000 mg | ORAL_CAPSULE | Freq: Every day | ORAL | 1 refills | Status: DC

## 2021-08-14 MED ORDER — CEFDINIR 300 MG PO CAPS
300.0000 mg | ORAL_CAPSULE | Freq: Two times a day (BID) | ORAL | 0 refills | Status: DC
Start: 2021-08-15 — End: 2021-11-16

## 2021-08-14 NOTE — Plan of Care (Signed)

## 2021-08-14 NOTE — Plan of Care (Signed)
  Problem: Activity: Goal: Ability to tolerate increased activity will improve Outcome: Progressing   Problem: Clinical Measurements: Goal: Ability to maintain a body temperature in the normal range will improve Outcome: Progressing   

## 2021-08-14 NOTE — NC FL2 (Signed)
?[place] MEDICAID FL2 LEVEL OF CARE SCREENING TOOL  ?  ? ?IDENTIFICATION  ?Patient Name: ?[name] Birthdate: 1940/05/08 Sex: female Admission Date (Current Location): ?08/07/2021  ?[place] and [place] Number: [facility] ?[id number] Ama and Address:  ?[facility] [address] [place] ?     Provider Number: ?[id number]  ?Attending Physician Name and Address:  ?Orson Eva, MD ? Relative Name and Phone Number:  ?  ?   ?Current Level of Care: ?Hospital Recommended Level of Care: ?Assisted Living Facility Prior Approval Number: ?  ? ?Date Approved/Denied: ?  PASRR Number: ?  ? ?Discharge Plan: ?Other (Comment) (ALF) ?  ? ?Current Diagnoses: ?Patient Active Problem List  ? Diagnosis Date Noted  ? Hypomagnesemia 08/09/2021  ? Pneumococcal bacteremia 08/08/2021  ? Anemia of chronic disease   ? Pneumococcal pneumonia ([place]) 08/07/2021  ? Acute metabolic encephalopathy [id number]  ? Acute respiratory failure with hypoxia ([place]) 08/07/2021  ? Septic shock ([place]) 08/07/2021  ? Hypokalemia 08/07/2021  ? COVID-19 virus infection 07/07/2021  ? CAP (community acquired pneumonia) 07/07/2021  ? 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 Hypertension 01/01/2017  ? Hyperlipidemia 01/01/2017  ? Persistent atrial fibrillation ([place]) 01/01/2017  ? Hypothyroidism 01/01/2017  ? 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 Lower extremity edema 01/01/2017  ? ? ?Orientation RESPIRATION BLADDER Height & Weight   ?  ?Self ? O2 (3L) Incontinent Weight: 139 lb 1.8 oz (63.1 kg) ?Height:  5\' 3"  (160 cm)  ?BEHAVIORAL SYMPTOMS/MOOD NEUROLOGICAL BOWEL NUTRITION STATUS  ?    Incontinent Diet (Heart healthy)  ?AMBULATORY STATUS COMMUNICATION OF NEEDS Skin   ?Extensive Assist Verbally Other (Comment) (Stage II to sacrum with foam dressing. Redness to buttocks.) ?  ?  ?  ?    ?     ?     ? ? ?Personal Care Assistance Level of Assistance  ?Bathing, Feeding, Dressing Bathing Assistance: Maximum  assistance ?Feeding assistance: Limited assistance ?Dressing Assistance: Maximum assistance ?   ? ?Functional Limitations Info  ?Sight, Hearing, Speech Sight Info: Adequate ?Hearing Info: Adequate ?Speech Info: Adequate  ? ? ?SPECIAL CARE FACTORS FREQUENCY  ?PT (By licensed PT), OT (By licensed OT)   ?  ?PT Frequency: home health ?OT Frequency: home health ?  ?  ?  ?   ? ? ?Contractures    ? ? ?Additional Factors Info  ?Code Status, Allergies, Psychotropic Code Status Info: Full code ?Allergies Info: Sulfa antibiotics ?Psychotropic Info: Lexapro, Remeron ?  ?  ?   ? ?Current Medications (08/14/2021):  This is the current hospital active medication list ?Current Facility-Administered Medications  ?Medication Dose Route Frequency Provider Last Rate Last Admin  ? 0.9 %  sodium chloride infusion  250 mL Intravenous Continuous Tat, David, MD   Stopped at 08/09/21 2014  ? acetaminophen (TYLENOL) tablet 650 mg  650 mg Oral Q6H PRN Tat, David, MD      ? Or  ? acetaminophen (TYLENOL) suppository 650 mg  650 mg Rectal Q6H PRN Tat, Shanon Brow, MD      ? apixaban [place]) tablet 5 mg  5 mg Oral BID Tat, Shanon Brow, MD   5 mg at 08/13/21 2150  ? atorvastatin (LIPITOR) tablet 20 mg  20 mg Oral Daily Tat, Shanon Brow, MD   20 mg at 08/13/21 1057  ? [START ON 08/15/2021] cefdinir (OMNICEF) capsule 300 mg  300 mg Oral Q12H Tat, David, MD      ? cefTRIAXone (ROCEPHIN) 2 g  in sodium chloride 0.9 % 100 mL IVPB  2 g Intravenous Q24H Tat, David, MD      ? chlorhexidine (PERIDEX) 0.12 % solution 15 mL  15 mL Mouth Rinse BID Orson Eva, MD   15 mL at 08/13/21 2149  ? Chlorhexidine Gluconate Cloth 2 % PADS 6 each  6 each Topical [id number] Orson Eva, MD   6 each at 08/14/21 0541  ? diltiazem (CARDIZEM CD) 24 hr capsule 240 mg  240 mg Oral Daily Tat, David, MD   240 mg at 08/13/21 1058  ? ferrous sulfate tablet 325 mg  325 mg Oral Q breakfast Tat, Shanon Brow, MD   325 mg at 08/13/21 0739  ? guaiFENesin-dextromethorphan (ROBITUSSIN DM) 100-10 MG/5ML syrup 5 mL  5 mL  Oral Q4H PRN Tat, David, MD   5 mL at 08/10/21 1817  ? levalbuterol (XOPENEX) nebulizer solution 0.63 mg  0.63 mg Nebulization Q6H PRN Tat, Shanon Brow, MD      ? levothyroxine (SYNTHROID) tablet 75 mcg  75 mcg Oral QAC breakfast Tat, Shanon Brow, MD   75 mcg at 08/14/21 0541  ? magnesium oxide (MAG-OX) tablet 400 mg  400 mg Oral BID Orson Eva, MD   400 mg at 08/13/21 2151  ? MEDLINE mouth rinse  15 mL Mouth Rinse q12n4p Orson Eva, MD   15 mL at 08/13/21 1742  ? metoprolol tartrate (LOPRESSOR) tablet 75 mg  75 mg Oral BID Tat, David, MD      ? norepinephrine (LEVOPHED) 4mg  in 262mL (0.016 mg/mL) premix infusion  2-10 mcg/min Intravenous Titrated Orson Eva, MD   Stopped at 08/09/21 [phone]  ? ondansetron (ZOFRAN) tablet 4 mg  4 mg Oral Q6H PRN Tat, David, MD      ? Or  ? ondansetron (ZOFRAN) injection 4 mg  4 mg Intravenous Q6H PRN Tat, David, MD      ? polyethylene glycol (MIRALAX / GLYCOLAX) packet 17 g  17 g Oral Daily PRN Orson Eva, MD      ? ? ? ?Discharge Medications: ?TAKE these medications   ?  ?apixaban 5 MG Tabs tablet ?Commonly known as: ELIQUIS ?Take 5 mg by mouth 2 (two) times daily. ?   ?aspirin EC 81 MG tablet ?Take 81 mg by mouth daily. ?   ?atorvastatin 20 MG tablet ?Commonly known as: LIPITOR ?Take 20 mg by mouth daily. ?   ?Biofreeze 4 % Gel ?Generic drug: Menthol (Topical Analgesic) ?Apply 1 application topically See admin instructions. Apply to right shoulder twice a day ?   ?calcium carbonate 500 MG chewable tablet ?Commonly known as: TUMS - dosed in mg elemental calcium ?Chew 1 tablet by mouth 2 (two) times daily with a meal. ?   ?cefdinir 300 MG capsule ?Commonly known as: OMNICEF ?Take 1 capsule (300 mg total) by mouth every 12 (twelve) hours. ?Start taking on: August 15, 2021 ?What changed: when to take this ?   ?cholecalciferol 25 MCG (1000 UNIT) tablet ?Commonly known as: VITAMIN D3 ?Take 1,000 Units by mouth daily. ?   ?diltiazem 240 MG 24 hr capsule ?Commonly known as: CARDIZEM CD ?Take 1 capsule  (240 mg total) by mouth daily. ?   ?escitalopram 5 MG tablet ?Commonly known as: LEXAPRO ?Take 2.5 mg by mouth at bedtime. ?   ?ferrous sulfate 325 (65 FE) MG tablet ?Take 325 mg by mouth daily with breakfast. ?   ?furosemide 20 MG tablet ?Commonly known as: LASIX ?Take 20 mg by mouth daily. ?   ?Levothyroxine Sodium  [place] ?Take 75 mcg by mouth daily before breakfast. ?   ?magnesium oxide 400 (240 Mg) MG tablet ?Commonly known as: MAG-OX ?Take 1 tablet (400 mg total) by mouth 2 (two) times daily. ?   ?Metoprolol Tartrate 75 MG Tabs ?Take 75 mg by mouth 2 (two) times daily. ?   ?mirtazapine 15 MG tablet ?Commonly known as: REMERON ?Take 15 mg by mouth at bedtime. ?   ?omeprazole 20 MG capsule ?Commonly known as: PRILOSEC ?Take 20 mg by mouth daily. ?   ?promethazine 12.5 MG tablet ?Commonly known as: PHENERGAN ?Take 12.5 mg by mouth daily as needed for nausea or vomiting. ?   ?ramipril 2.5 MG capsule ?Commonly known as: ALTACE ?Take 1 capsule (2.5 mg total) by mouth daily. ?   ?SYSTANE BALANCE OP ?Place 1 drop into both eyes 2 (two) times daily. ?   ?vitamin B-12 500 MCG tablet ?Commonly known as: CYANOCOBALAMIN ?Take 500 mcg by mouth daily. ?   ? ? ?Relevant Imaging Results: ? ?Relevant Lab Results: ? ? ?Additional Information ?  ? ?Salome Arnt, LCSW ? ? ? ? ?

## 2021-08-14 NOTE — Progress Notes (Signed)
Patient suffers from acute respiratory failure with hypoxia which impairs their ability to perform daily activities like ambulating in the home. A walker will not resolve issue with performing activities of daily living. A wheelchair will allow patient to safely perform daily activities. Patient can safely propel the wheelchair in the home or has a caregiver who can provide assistance. ? ?

## 2021-08-14 NOTE — Plan of Care (Signed)
?  Problem: Activity: ?Goal: Ability to tolerate increased activity will improve ?08/14/2021 1627 by Karolee Ohs, RN ?Outcome: Adequate for Discharge ?08/14/2021 6568 by Karolee Ohs, RN ?Outcome: Progressing ?  ?Problem: Clinical Measurements: ?Goal: Ability to maintain a body temperature in the normal range will improve ?08/14/2021 1627 by Karolee Ohs, RN ?Outcome: Adequate for Discharge ?08/14/2021 1275 by Karolee Ohs, RN ?Outcome: Progressing ?  ?Problem: Respiratory: ?Goal: Ability to maintain adequate ventilation will improve ?Outcome: Adequate for Discharge ?Goal: Ability to maintain a clear airway will improve ?Outcome: Adequate for Discharge ?  ?Problem: Education: ?Goal: Knowledge of General Education information will improve ?Description: Including pain rating scale, medication(s)/side effects and non-pharmacologic comfort measures ?Outcome: Adequate for Discharge ?  ?Problem: Health Behavior/Discharge Planning: ?Goal: Ability to manage health-related needs will improve ?Outcome: Adequate for Discharge ?  ?Problem: Clinical Measurements: ?Goal: Ability to maintain clinical measurements within normal limits will improve ?Outcome: Adequate for Discharge ?Goal: Will remain free from infection ?Outcome: Adequate for Discharge ?Goal: Diagnostic test results will improve ?Outcome: Adequate for Discharge ?Goal: Respiratory complications will improve ?Outcome: Adequate for Discharge ?Goal: Cardiovascular complication will be avoided ?Outcome: Adequate for Discharge ?  ?Problem: Activity: ?Goal: Risk for activity intolerance will decrease ?Outcome: Adequate for Discharge ?  ?Problem: Nutrition: ?Goal: Adequate nutrition will be maintained ?Outcome: Adequate for Discharge ?  ?Problem: Coping: ?Goal: Level of anxiety will decrease ?Outcome: Adequate for Discharge ?  ?Problem: Elimination: ?Goal: Will not experience complications related to bowel motility ?Outcome: Adequate for Discharge ?Goal: Will not  experience complications related to urinary retention ?Outcome: Adequate for Discharge ?  ?Problem: Pain Managment: ?Goal: General experience of comfort will improve ?Outcome: Adequate for Discharge ?  ?Problem: Safety: ?Goal: Ability to remain free from injury will improve ?Outcome: Adequate for Discharge ?  ?Problem: Skin Integrity: ?Goal: Risk for impaired skin integrity will decrease ?Outcome: Adequate for Discharge ?  ?Problem: Acute Rehab PT Goals(only PT should resolve) ?Goal: Pt Will Go Supine/Side To Sit ?Outcome: Adequate for Discharge ?Goal: Patient Will Transfer Sit To/From Stand ?Outcome: Adequate for Discharge ?Goal: Pt Will Transfer Bed To Chair/Chair To Bed ?Outcome: Adequate for Discharge ?Goal: Pt Will Ambulate ?Outcome: Adequate for Discharge ?  ?

## 2021-08-14 NOTE — Care Management Important Message (Signed)
Important Message ? ?Patient Details  ?Name: [name] ?MRN: [id number] ?Date of Birth: 1940-08-07 ? ? ?Medicare Important Message Given:  Yes ? ? ? ? ?Tommy Medal ?08/14/2021, 12:17 PM ?

## 2021-08-14 NOTE — Progress Notes (Signed)
Nsg Discharge Note ? ?Admit Date:  08/07/2021 ?Discharge date: 08/14/2021 ?  ?Tonianne Fine to be D/C'd [facility] per MD order.  AVS completed.   ?Removed Ivs-CDI. Reviewed d/c paperwork with patient. Answered all questions. [facility] came to transport patient and belongings-oxygen tank, oxygen concentrator, and wheelchair.  ? ? ?Discharge Medication: ?Allergies as of 08/14/2021   ? ?   Reactions  ? Sulfa Antibiotics Other (See Comments)  ? Don't remember reaction  ? ?  ? ?  ?Medication List  ?  ? ?STOP taking these medications   ? ?azithromycin 250 MG tablet ?Commonly known as: Zithromax Z-Pak ?  ?carvedilol 12.5 MG tablet ?Commonly known as: COREG ?  ?mupirocin ointment 2 % ?Commonly known as: BACTROBAN ?  ? ?  ? ?TAKE these medications   ? ?apixaban 5 MG Tabs tablet ?Commonly known as: ELIQUIS ?Take 5 mg by mouth 2 (two) times daily. ?  ?aspirin EC 81 MG tablet ?Take 81 mg by mouth daily. ?  ?atorvastatin 20 MG tablet ?Commonly known as: LIPITOR ?Take 20 mg by mouth daily. ?  ?Biofreeze 4 % Gel ?Generic drug: Menthol (Topical Analgesic) ?Apply 1 application topically See admin instructions. Apply to right shoulder twice a day ?  ?calcium carbonate 500 MG chewable tablet ?Commonly known as: TUMS - dosed in mg elemental calcium ?Chew 1 tablet by mouth 2 (two) times daily with a meal. ?  ?cefdinir 300 MG capsule ?Commonly known as: OMNICEF ?Take 1 capsule (300 mg total) by mouth every 12 (twelve) hours. ?Start taking on: August 15, 2021 ?What changed: when to take this ?  ?cholecalciferol 25 MCG (1000 UNIT) tablet ?Commonly known as: VITAMIN D3 ?Take 1,000 Units by mouth daily. ?  ?diltiazem 240 MG 24 hr capsule ?Commonly known as: CARDIZEM CD ?Take 1 capsule (240 mg total) by mouth daily. ?  ?escitalopram 5 MG tablet ?Commonly known as: LEXAPRO ?Take 2.5 mg by mouth at bedtime. ?  ?ferrous sulfate 325 (65 FE) MG tablet ?Take 325 mg by mouth daily with breakfast. ?  ?furosemide 20 MG tablet ?Commonly known as:  LASIX ?Take 20 mg by mouth daily. ?  ?Levothyroxine Sodium 75 MCG Caps ?Take 75 mcg by mouth daily before breakfast. ?  ?magnesium oxide 400 (240 Mg) MG tablet ?Commonly known as: MAG-OX ?Take 1 tablet (400 mg total) by mouth 2 (two) times daily. ?  ?Metoprolol Tartrate 75 MG Tabs ?Take 75 mg by mouth 2 (two) times daily. ?  ?mirtazapine 15 MG tablet ?Commonly known as: REMERON ?Take 15 mg by mouth at bedtime. ?  ?omeprazole 20 MG capsule ?Commonly known as: PRILOSEC ?Take 20 mg by mouth daily. ?  ?promethazine 12.5 MG tablet ?Commonly known as: PHENERGAN ?Take 12.5 mg by mouth daily as needed for nausea or vomiting. ?  ?ramipril 2.5 MG capsule ?Commonly known as: ALTACE ?Take 1 capsule (2.5 mg total) by mouth daily. ?  ?SYSTANE BALANCE OP ?Place 1 drop into both eyes 2 (two) times daily. ?  ?vitamin B-12 500 MCG tablet ?Commonly known as: CYANOCOBALAMIN ?Take 500 mcg by mouth daily. ?  ? ?  ? ?  ?  ? ? ?  ?Durable Medical Equipment  ?(From admission, onward)  ?  ? ? ?  ? ?  Start     Ordered  ? 08/14/21 0830  For home use only DME lightweight manual wheelchair with seat cushion  Once       ?Comments: Patient suffers from sepsis, falls, deconditining which impairs their  ability to perform daily activities like toileting in the home.  A walker will not resolve  ?issue with performing activities of daily living. A wheelchair will allow patient to safely perform daily activities. Patient is not able to propel themselves in the home using a standard weight wheelchair due to general weakness. Patient can self propel in the lightweight wheelchair. Length of need Lifetime. ?Accessories: elevating leg rests (ELRs), wheel locks, extensions and anti-tippers.  ? 08/14/21 0830  ? 08/14/21 0802  For home use only DME oxygen  Once       ?Question Answer Comment  ?Length of Need 6 Months   ?Mode or (Route) Nasal cannula   ?Liters per Minute 3   ?Frequency Continuous (stationary and portable oxygen unit needed)   ?Oxygen conserving  device Yes   ?Oxygen delivery system Gas   ?  ? 08/14/21 0801  ? ?  ?  ? ?  ? ? ?Discharge Assessment: ?Vitals:  ? 08/14/21 0935 08/14/21 1435  ?BP: (!) 116/92 (!) 136/95  ?Pulse: (!) 103 96  ?Resp:  20  ?Temp:  (!) 97.4 ?F (36.3 ?C)  ?SpO2:  94%  ? Skin clean, dry and intact without evidence of skin break down, no evidence of skin tears noted. ?IV catheter discontinued intact. Site without signs and symptoms of complications - no redness or edema noted at insertion site, patient denies c/o pain - only slight tenderness at site.  Dressing with slight pressure applied. ? ?D/c Instructions-Education: ?Discharge instructions given to patient/family with verbalized understanding. ?D/c education completed with patient/family including follow up instructions, medication list, d/c activities limitations if indicated, with other d/c instructions as indicated by MD - patient able to verbalize understanding, all questions fully answered. ?Patient instructed to return to ED, call 911, or call MD for any changes in condition.  ?Patient escorted via WC, and D/C home via private auto. ? ?Karolee Ohs, RN ?08/14/2021 5:14 PM  ?

## 2021-08-14 NOTE — Progress Notes (Signed)
SATURATION QUALIFICATIONS: (This note is used to comply with regulatory documentation for home oxygen) ?  ?Patient Saturations on Room Air at Rest = 87 ?  ?Patient Saturations on Room Air while Ambulating = n/a ?  ?Patient Saturations on 3 Liters of oxygen while Ambulating = 95 ?  ?Please briefly explain why patient needs home oxygen: To maintain 02 sat at 90% or above during ambulation. ? ? ?Catarina Hartshorn, DO ?

## 2021-08-14 NOTE — TOC Transition Note (Signed)
Transition of Care (TOC) - CM/SW Discharge Note ? ? ?Patient Details  ?Name: [name] ?MRN: [id number] ?Date of Birth: [birth year] ? ?Transition of Care (TOC) CM/SW Contact:  ?Karn Cassis, LCSW ?Phone Number: ?08/14/2021, 1:57 PM ? ? ?Clinical Narrative:  Pt d/c today back to [facility]. Pt's son and facility aware and agreeable. Pt needs home O2 and wheelchair. Discussed with [name] at [facility] who requests [facility]. Referral made. Awaiting delivery of DME and then transport will be set up with [facility] as [facility] does not have wheelchair [name]. Referred to [facility] for home health PT, OT, RN at [name]'s request. Clydie Braun with [facility] accepts and aware of d/c today. D/C summary and FL2 sent to facility.    ? ? ? ?Final next level of care: Assisted Living ?Barriers to Discharge: Barriers Resolved ? ? ?Patient Goals and CMS Choice ?Patient states their goals for this hospitalization and ongoing recovery are:: return to ALF ?  ?Choice offered to / list presented to : Adult Children ? ?Discharge Placement ?  ?           ?  ?Patient to be transferred to facility by: [facility] ?Name of family member notified: [name]- son ?Patient and family notified of of transfer: 08/14/21 ? ?Discharge Plan and Services ?In-house Referral: Clinical Social Work ?  ?Post Acute Care Choice: Resumption of Svcs/PTA Provider          ?DME Arranged: Wheelchair manual, Oxygen ?DME Agency: [facility] ?Date DME Agency Contacted: 08/14/21 ?Time DME Agency Contacted: 1357 ?Representative spoke with at DME Agency: Morrie Sheldon ?HH Arranged: RN, PT, OT ?HH Agency: [facility] ?Date HH Agency Contacted: 08/14/21 ?Time HH Agency Contacted: 1357 ?Representative spoke with at [facility] Agency: Clydie Braun ? ?Social Determinants of Health (SDOH) Interventions ?  ? ? ?Readmission Risk Interventions ? ?  08/11/2021  ? 10:29 AM 08/09/2021  ?  2:34 PM  ?Readmission Risk Prevention Plan  ?Transportation Screening Complete Complete  ?PCP or Specialist  Appt within 3-5 Days Complete   ?HRI or Home Care Consult Complete Complete  ?Social Work Consult for Recovery Care Planning/Counseling Complete Complete  ?Palliative Care Screening Not Applicable Not Applicable  ?Medication Review Oceanographer) Complete Complete  ? ? ? ? ? ?

## 2021-08-15 ENCOUNTER — Encounter (HOSPITAL_COMMUNITY): Payer: Self-pay | Admitting: *Deleted

## 2021-08-15 ENCOUNTER — Other Ambulatory Visit: Payer: Self-pay

## 2021-08-15 ENCOUNTER — Emergency Department (HOSPITAL_COMMUNITY)
Admission: EM | Admit: 2021-08-15 | Discharge: 2021-08-15 | Disposition: A | Discharge status: Home / Self Care | Payer: Medicare Other | Attending: Emergency Medicine | Admitting: Emergency Medicine

## 2021-08-15 ENCOUNTER — Emergency Department (HOSPITAL_COMMUNITY): Payer: Medicare Other

## 2021-08-15 DIAGNOSIS — Z7982 Long term (current) use of aspirin: Secondary | ICD-10-CM | POA: Insufficient documentation

## 2021-08-15 DIAGNOSIS — J441 Chronic obstructive pulmonary disease with (acute) exacerbation: Secondary | ICD-10-CM

## 2021-08-15 DIAGNOSIS — I480 Paroxysmal atrial fibrillation: Secondary | ICD-10-CM

## 2021-08-15 DIAGNOSIS — Z7901 Long term (current) use of anticoagulants: Secondary | ICD-10-CM | POA: Diagnosis not present

## 2021-08-15 DIAGNOSIS — R0602 Shortness of breath: Secondary | ICD-10-CM | POA: Diagnosis present

## 2021-08-15 MED ORDER — METOPROLOL TARTRATE 25 MG PO TABS
75.0000 mg | ORAL_TABLET | Freq: Once | ORAL | Status: AC
  Administered 2021-08-15: 75 mg via ORAL
  Filled 2021-08-15: qty 1

## 2021-08-15 MED ORDER — CEFTRIAXONE SODIUM 1 G IJ SOLR
1.0000 g | Freq: Once | INTRAMUSCULAR | Status: AC
  Administered 2021-08-15: 1 g via INTRAMUSCULAR
  Filled 2021-08-15: qty 10

## 2021-08-15 NOTE — Discharge Instructions (Signed)
Patient needs to take her metoprolol as prescribed when she was discharged yesterday ?

## 2021-08-15 NOTE — ED Triage Notes (Signed)
Pt brought in by [facility] for c/o sob and weakness; pt was 82% on 3L and ems increased O2 to 6L and pt increased to 96% ?

## 2021-08-15 NOTE — ED Notes (Signed)
Pt is dry and dressed ready for transport back to center ?

## 2021-08-15 NOTE — ED Provider Notes (Signed)
?[facility] ?Provider Note ? ? ?CSN: [id number] ?Arrival date & time: 08/15/21  1058 ? ?  ? ?History ? ?Chief Complaint  ?Patient presents with  ? Shortness of Breath  ? ? ?[name] is a 81 y.o. female. ? ?Patient with a history of pneumonia and atrial fibs.  Patient was discharged from the hospital yesterday with pneumonia and was supposed to start metoprolol 75 mg twice a day along with Rocephin once a day at the nursing home.  The nursing home sent her here for shortness of breath and rapid A-fib.  She did not receive her metoprolol there ? ?The history is provided by the patient and the nursing home. No language interpreter was used.  ?Shortness of Breath ?Severity:  Mild ?Onset quality:  Sudden ?Timing:  Intermittent ?Progression:  Resolved ?Chronicity:  Recurrent ?Context: activity   ?Relieved by:  Nothing ?Worsened by:  Nothing ?Ineffective treatments:  None tried ?Associated symptoms: no abdominal pain, no chest pain, no cough, no headaches and no rash   ? ?  ? ?Home Medications ?Prior to Admission medications   ?Medication Sig Start Date End Date Taking? Authorizing Provider  ?apixaban (ELIQUIS) 5 MG TABS tablet Take 5 mg by mouth 2 (two) times daily.    [provider]  ?aspirin EC 81 MG tablet Take 81 mg by mouth daily.    [provider]  ?atorvastatin (LIPITOR) 20 MG tablet Take 20 mg by mouth daily.    [provider]  ?calcium carbonate (TUMS - DOSED IN MG ELEMENTAL CALCIUM) 500 MG chewable tablet Chew 1 tablet by mouth 2 (two) times daily with a meal.    [provider]  ?cefdinir (OMNICEF) 300 MG capsule Take 1 capsule (300 mg total) by mouth every 12 (twelve) hours. 08/15/21   Orson Eva, MD  ?cholecalciferol (VITAMIN D3) 25 MCG (1000 UNIT) tablet Take 1,000 Units by mouth daily.    [provider]  ?diltiazem (CARDIZEM CD) 240 MG 24 hr capsule Take 1 capsule (240 mg total) by mouth daily. 08/14/21   Orson Eva, MD  ?escitalopram  (LEXAPRO) 5 MG tablet Take 2.5 mg by mouth at bedtime.    [provider]  ?ferrous sulfate 325 (65 FE) MG tablet Take 325 mg by mouth daily with breakfast.    [provider]  ?furosemide (LASIX) 20 MG tablet Take 20 mg by mouth daily.    [provider]  ?Levothyroxine Sodium 75 MCG CAPS Take 75 mcg by mouth daily before breakfast.    [provider]  ?magnesium oxide (MAG-OX) 400 (240 Mg) MG tablet Take 1 tablet (400 mg total) by mouth 2 (two) times daily. 08/14/21   Orson Eva, MD  ?Menthol, Topical Analgesic, (BIOFREEZE) 4 % GEL Apply 1 application topically See admin instructions. Apply to right shoulder twice a day    [provider]  ?Metoprolol Tartrate 75 MG TABS Take 75 mg by mouth 2 (two) times daily. 08/14/21   Orson Eva, MD  ?mirtazapine (REMERON) 15 MG tablet Take 15 mg by mouth at bedtime.    [provider]  ?omeprazole (PRILOSEC) 20 MG capsule Take 20 mg by mouth daily.    [provider]  ?promethazine (PHENERGAN) 12.5 MG tablet Take 12.5 mg by mouth daily as needed for nausea or vomiting.    [provider]  ?Propylene Glycol (SYSTANE BALANCE OP) Place 1 drop into both eyes 2 (two) times daily.    [provider]  ?ramipril (ALTACE)  2.5 MG capsule Take 1 capsule (2.5 mg total) by mouth daily. 02/05/17   Imogene Burn, PA-C  ?vitamin B-12 (CYANOCOBALAMIN) 500 MCG tablet Take 500 mcg by mouth daily.    [provider]  ?   ? ?Allergies    ?Sulfa antibiotics   ? ?Review of Systems   ?Review of Systems  ?Constitutional:  Negative for appetite change and fatigue.  ?HENT:  Negative for congestion, ear discharge and sinus pressure.   ?Eyes:  Negative for discharge.  ?Respiratory:  Positive for shortness of breath. Negative for cough.   ?Cardiovascular:  Negative for chest pain.  ?Gastrointestinal:  Negative for abdominal pain and diarrhea.  ?Genitourinary:  Negative for frequency and hematuria.  ?Musculoskeletal:   Negative for back pain.  ?Skin:  Negative for rash.  ?Neurological:  Negative for seizures and headaches.  ?Psychiatric/Behavioral:  Negative for hallucinations.   ? ?Physical Exam ?Updated Vital Signs ?BP (!) 142/99   Pulse (!) 103   Temp (!) 97.5 ?F (36.4 ?C) (Oral)   Resp (!) 27   Ht 5\' 3"  (1.6 m)   Wt 63 kg   SpO2 94%   BMI 24.60 kg/m?  ?Physical Exam ?Vitals and nursing note reviewed.  ?Constitutional:   ?   Appearance: She is well-developed.  ?HENT:  ?   Head: Normocephalic.  ?   Nose: Nose normal.  ?Eyes:  ?   General: No scleral icterus. ?   Conjunctiva/sclera: Conjunctivae normal.  ?Neck:  ?   Thyroid: No thyromegaly.  ?Cardiovascular:  ?   Rate and Rhythm: Tachycardia present. Rhythm irregular.  ?   Heart sounds: No murmur heard. ?  No friction rub. No gallop.  ?Pulmonary:  ?   Breath sounds: No stridor. No wheezing or rales.  ?Chest:  ?   Chest wall: No tenderness.  ?Abdominal:  ?   General: There is no distension.  ?   Tenderness: There is no abdominal tenderness. There is no rebound.  ?Musculoskeletal:     ?   General: Normal range of motion.  ?   Cervical back: Neck supple.  ?Lymphadenopathy:  ?   Cervical: No cervical adenopathy.  ?Skin: ?   Findings: No erythema or rash.  ?Neurological:  ?   Mental Status: She is alert and oriented to person, place, and time.  ?   Motor: No abnormal muscle tone.  ?   Coordination: Coordination normal.  ?Psychiatric:     ?   Behavior: Behavior normal.  ? ? ?ED Results / Procedures / Treatments   ?Labs ?(all labs ordered are listed, but only abnormal results are displayed) ?Labs Reviewed - No data to display ? ?EKG ?None ? ?Radiology ?DG Chest Port 1 View ? ?Result Date: 08/15/2021 ?CLINICAL DATA:  Shortness of breath, hypoxia EXAM: PORTABLE CHEST 1 VIEW COMPARISON:  Previous studies including the examination of 08/07/2021 FINDINGS: Transverse diameter of heart is increased. There are alveolar infiltrates in the right upper lobe with interval worsening. There is  increased density in both lower lung fields with blunting of costophrenic angles with interval worsening. There is no pneumothorax. IMPRESSION: Multifocal pneumonia with interval worsening. There is possible increase in size of bilateral pleural effusions. Electronically Signed   By: Elmer Picker M.D.   On: 08/15/2021 12:52   ? ?Procedures ?Procedures  ? ? ?Medications Ordered in ED ?Medications  ?metoprolol tartrate (LOPRESSOR) tablet 75 mg (75 mg Oral Given 08/15/21 1259)  ?cefTRIAXone (ROCEPHIN) injection 1 g (1 g Intramuscular Given 08/15/21  1614)  ? ? ?ED Course/ Medical Decision Making/ A&P ?Patient was given metoprolol and her heart rate came down to the normal range.  We contacted the nursing home and told them that she needed to get her metoprolol and Rocephin as ordered.  Also the patient's O2 sats on nasal oxygen the same milliliters she was discharged on was 95% ?                        ?Medical Decision Making ?Amount and/or Complexity of Data Reviewed ?Radiology: ordered. ? ?Risk ?Prescription drug management. ? ?This patient presents to the ED for concern of rapid A-fib and shortness of breath, this involves an extensive number of treatment options, and is a complaint that carries with it a high risk of complications and morbidity.  The differential diagnosis includes COPD, resolving pneumonia, atrial fibs ? ? ?Co morbidities that complicate the patient evaluation ? ?Atrial fibs ? ? ?Additional history obtained: ? ?Additional history obtained from nursing home ?External records from outside source obtained and reviewed including hospital records ? ? ?Lab Tests: ? ?No labs ordered ? ?Imaging Studies ordered: ? ?I ordered imaging studies including chest x-ray ?I independently visualized and interpreted imaging which showed pneumonia as seen with admission from yesterday ?I agree with the radiologist interpretation ? ? ?Cardiac Monitoring: / EKG: ? ?The patient was maintained on a cardiac monitor.   I personally viewed and interpreted the cardiac monitored which showed an underlying rhythm of: Atrial fibs ? ? ?Consultations Obtained: ? ?No consulted ? ?Problem List / ED Course / Critical intervention

## 2021-08-16 LAB — CULTURE, BLOOD (ROUTINE X 2)
Culture: NO GROWTH
Culture: NO GROWTH
Special Requests: ADEQUATE
Special Requests: ADEQUATE

## 2021-08-21 ENCOUNTER — Other Ambulatory Visit: Payer: Self-pay

## 2021-08-21 ENCOUNTER — Emergency Department (HOSPITAL_COMMUNITY)
Admission: EM | Admit: 2021-08-21 | Discharge: 2021-08-22 | Disposition: A | Discharge status: Home / Self Care | Payer: Medicare Other | Attending: Emergency Medicine | Admitting: Emergency Medicine

## 2021-08-21 ENCOUNTER — Emergency Department (HOSPITAL_COMMUNITY): Payer: Medicare Other

## 2021-08-21 ENCOUNTER — Encounter (HOSPITAL_COMMUNITY): Payer: Self-pay | Admitting: Emergency Medicine

## 2021-08-21 DIAGNOSIS — I700 Atherosclerosis of aorta: Secondary | ICD-10-CM | POA: Diagnosis not present

## 2021-08-21 DIAGNOSIS — E039 Hypothyroidism, unspecified: Secondary | ICD-10-CM | POA: Insufficient documentation

## 2021-08-21 DIAGNOSIS — I10 Essential (primary) hypertension: Secondary | ICD-10-CM | POA: Insufficient documentation

## 2021-08-21 DIAGNOSIS — I4891 Unspecified atrial fibrillation: Secondary | ICD-10-CM | POA: Diagnosis not present

## 2021-08-21 DIAGNOSIS — K6289 Other specified diseases of anus and rectum: Secondary | ICD-10-CM | POA: Insufficient documentation

## 2021-08-21 DIAGNOSIS — K649 Unspecified hemorrhoids: Secondary | ICD-10-CM | POA: Insufficient documentation

## 2021-08-21 DIAGNOSIS — Z79899 Other long term (current) drug therapy: Secondary | ICD-10-CM | POA: Diagnosis not present

## 2021-08-21 DIAGNOSIS — R935 Abnormal findings on diagnostic imaging of other abdominal regions, including retroperitoneum: Secondary | ICD-10-CM | POA: Insufficient documentation

## 2021-08-21 DIAGNOSIS — Z7982 Long term (current) use of aspirin: Secondary | ICD-10-CM | POA: Insufficient documentation

## 2021-08-21 DIAGNOSIS — Z7901 Long term (current) use of anticoagulants: Secondary | ICD-10-CM | POA: Insufficient documentation

## 2021-08-21 LAB — BASIC METABOLIC PANEL
Anion gap: 7 (ref 5–15)
BUN: 15 mg/dL (ref 8–23)
CO2: 30 mmol/L (ref 22–32)
Calcium: 8.1 mg/dL — ABNORMAL LOW (ref 8.9–10.3)
Chloride: 102 mmol/L (ref 98–111)
Creatinine, Ser: 0.82 mg/dL (ref 0.44–1.00)
GFR, Estimated: >60 mL/min (ref >60)
Glucose, Bld: 107 mg/dL — ABNORMAL HIGH (ref 70–99)
Potassium: 4 mmol/L (ref 3.5–5.1)
Sodium: 139 mmol/L (ref 135–145)

## 2021-08-21 LAB — CBC WITH DIFFERENTIAL/PLATELET
Abs Immature Granulocytes: 0.13 10*3/uL — ABNORMAL HIGH (ref 0.00–0.07)
Basophils Absolute: 0.1 10*3/uL (ref 0.0–0.1)
Basophils Relative: 0 %
Eosinophils Absolute: 0.1 10*3/uL (ref 0.0–0.5)
Eosinophils Relative: 0 %
HCT: 32.3 % — ABNORMAL LOW (ref 36.0–46.0)
Hemoglobin: 9.7 g/dL — ABNORMAL LOW (ref 12.0–15.0)
Immature Granulocytes: 1 %
Lymphocytes Relative: 6 %
Lymphs Abs: 1.1 10*3/uL (ref 0.7–4.0)
MCH: 26 pg (ref 26.0–34.0)
MCHC: 30 g/dL (ref 30.0–36.0)
MCV: 86.6 fL (ref 80.0–100.0)
Monocytes Absolute: 0.8 10*3/uL (ref 0.1–1.0)
Monocytes Relative: 4 %
Neutro Abs: 15.9 10*3/uL — ABNORMAL HIGH (ref 1.7–7.7)
Neutrophils Relative %: 89 %
Platelets: 397 10*3/uL (ref 150–400)
RBC: 3.73 MIL/uL — ABNORMAL LOW (ref 3.87–5.11)
RDW: 20.2 % — ABNORMAL HIGH (ref 11.5–15.5)
WBC: 18.1 10*3/uL — ABNORMAL HIGH (ref 4.0–10.5)
nRBC: 0 % (ref 0.0–0.2)

## 2021-08-21 LAB — POC OCCULT BLOOD, ED: Lab: POSITIVE

## 2021-08-21 MED ORDER — MIRTAZAPINE 15 MG PO TABS
15.0000 mg | ORAL_TABLET | Freq: Every day | ORAL | Status: DC
  Administered 2021-08-21: 15 mg via ORAL
  Filled 2021-08-21: qty 1

## 2021-08-21 MED ORDER — IOHEXOL 300 MG/ML  SOLN
100.0000 mL | Freq: Once | INTRAMUSCULAR | Status: AC | PRN
  Administered 2021-08-21: 75 mL via INTRAVENOUS

## 2021-08-21 MED ORDER — APIXABAN 5 MG PO TABS
5.0000 mg | ORAL_TABLET | Freq: Two times a day (BID) | ORAL | Status: DC
  Administered 2021-08-21: 5 mg via ORAL
  Filled 2021-08-21: qty 1

## 2021-08-21 NOTE — ED Triage Notes (Signed)
Pt to the ED with [facility] from [facility] with complaints of hemorrhoids. Pt does complain of leg pain during triage. ? ?

## 2021-08-21 NOTE — ED Notes (Signed)
2 unsuccessful Iv attempts by this nurse  ?

## 2021-08-21 NOTE — Discharge Instructions (Addendum)
Based on what you and staff at your place of residence described, I suspect that you had a rectal prolapse.  This was resolved by the time you got to the emergency department.  On your CT imaging, there was some mild inflammation of the rectal area.  Take medicine to help with stool softening to help with this.  If you experience another rectal prolapse, you can apply gentle pressure to put the rectum back in place.  This is something that you should discuss with a surgeon.  This can be done in the clinic.  There is a telephone number below that you can call to set up this follow-up appointment.  If you experience a prolapse that is not able to go back in place, you should return to the emergency department. ? ?On CT scan, there was also a collection of fluid around the area of your right hip.  From what you described, this is something that has been identified before.  Talk to your primary care doctor about this and they may want to do repeat imaging at some point to ensure that this fluid collection is not getting any worse. ?

## 2021-08-21 NOTE — ED Notes (Signed)
Notified [facility] of patient needing transportation back to [facility]. ?

## 2021-08-21 NOTE — ED Provider Notes (Signed)
?[facility] EMERGENCY DEPARTMENT ?Provider Note ? ? ?CSN: [id number] ?Arrival date & time: 08/21/21  1514 ? ?  ? ?History ? ?Chief Complaint  ?Patient presents with  ? Hemorrhoids  ? ? ?[name] is a 81 y.o. female. ? ?HPI ?Patient presents from skilled nursing facility for concern of possible hemorrhoids.  This was first noticed today.  At the time, patient has some discomfort.  A staff member at her facility identified an area of swelling and took a picture of the new finding and showed it to the patient.  Patient states that it "looked like hell".  On further questioning, she does state that there was a red mass.  She is not sure if this was following a bowel movement or urination.  She states that since this episode, she has had resolution of her discomfort.  Her medical history includes HTN, HLD, atrial fibrillation, crest syndrome, CVA, GERD, anemia, and hypothyroidism.  She is on 3 L of supplemental oxygen at baseline. ? ?History per staff at facility: Patient had a new finding in the area of her rectum that appeared to be a possible hemorrhoid.  This was first identified today.  Nurse practitioner advised them to send her to the ED. ?  ? ?Home Medications ?Prior to Admission medications   ?Medication Sig Start Date End Date Taking? Authorizing Provider  ?apixaban (ELIQUIS) 5 MG TABS tablet Take 5 mg by mouth 2 (two) times daily.   Yes [provider]  ?aspirin EC 81 MG tablet Take 81 mg by mouth daily.   Yes [provider]  ?atorvastatin (LIPITOR) 20 MG tablet Take 20 mg by mouth daily.   Yes [provider]  ?calcium carbonate (TUMS - DOSED IN MG ELEMENTAL CALCIUM) 500 MG chewable tablet Chew 1 tablet by mouth 2 (two) times daily with a meal.   Yes [provider]  ?cefdinir (OMNICEF) 300 MG capsule Take 1 capsule (300 mg total) by mouth every 12 (twelve) hours. 08/15/21  Yes Tat, Onalee Hua, MD  ?cholecalciferol (VITAMIN D3) 25 MCG (1000 UNIT) tablet Take 1,000 Units by  mouth daily.   Yes [provider]  ?diltiazem (CARDIZEM CD) 240 MG 24 hr capsule Take 1 capsule (240 mg total) by mouth daily. 08/14/21  Yes Tat, Onalee Hua, MD  ?escitalopram (LEXAPRO) 5 MG tablet Take 2.5 mg by mouth at bedtime.   Yes [provider]  ?ferrous sulfate 325 (65 FE) MG tablet Take 325 mg by mouth daily with breakfast.   Yes [provider]  ?furosemide (LASIX) 20 MG tablet Take 20 mg by mouth daily.   Yes [provider]  ?levothyroxine (SYNTHROID) 75 MCG tablet Take 75 mcg by mouth daily. 08/19/21  Yes [provider]  ?magnesium oxide (MAG-OX) 400 (240 Mg) MG tablet Take 1 tablet (400 mg total) by mouth 2 (two) times daily. 08/14/21  Yes Tat, Onalee Hua, MD  ?Menthol, Topical Analgesic, (BIOFREEZE) 4 % GEL Apply 1 application topically See admin instructions. Apply to right shoulder twice a day   Yes [provider]  ?Metoprolol Tartrate 75 MG TABS Take 75 mg by mouth 2 (two) times daily. 08/14/21  Yes Tat, Onalee Hua, MD  ?mirtazapine (REMERON) 15 MG tablet Take 15 mg by mouth at bedtime.   Yes [provider]  ?omeprazole (PRILOSEC) 20 MG capsule Take 20 mg by mouth daily.   Yes [provider]  ?Propylene Glycol (SYSTANE BALANCE OP) Place 1 drop into both eyes 2 (two) times daily.  Yes [provider]  ?ramipril (ALTACE) 2.5 MG capsule Take 1 capsule (2.5 mg total) by mouth daily. 02/05/17  Yes Dyann Kief, PA-C  ?vitamin B-12 (CYANOCOBALAMIN) 500 MCG tablet Take 500 mcg by mouth daily.   Yes [provider]  ?   ? ?Allergies    ?Sulfa antibiotics   ? ?Review of Systems   ?Review of Systems  ?Gastrointestinal:  Positive for rectal pain.  ?All other systems reviewed and are negative. ? ?Physical Exam ?Updated Vital Signs ?BP 128/63   Pulse 70   Temp 98.2 ?F (36.8 ?C)   Resp 18   Ht 5\' 3"  (1.6 m)   Wt 63 kg   SpO2 96%   BMI 24.60 kg/m?  ?Physical Exam ?Vitals and nursing note reviewed. Exam conducted with a  chaperone present.  ?Constitutional:   ?   General: She is not in acute distress. ?   Appearance: Normal appearance. She is well-developed and normal weight. She is not ill-appearing, toxic-appearing or diaphoretic.  ?HENT:  ?   Head: Normocephalic and atraumatic.  ?   Right Ear: External ear normal.  ?   Left Ear: External ear normal.  ?   Nose: Nose normal.  ?   Mouth/Throat:  ?   Mouth: Mucous membranes are moist.  ?   Pharynx: Oropharynx is clear.  ?Eyes:  ?   Extraocular Movements: Extraocular movements intact.  ?   Conjunctiva/sclera: Conjunctivae normal.  ?Cardiovascular:  ?   Rate and Rhythm: Normal rate and regular rhythm.  ?   Heart sounds: No murmur heard. ?Pulmonary:  ?   Effort: Pulmonary effort is normal. No respiratory distress.  ?Abdominal:  ?   Palpations: Abdomen is soft.  ?   Tenderness: There is no abdominal tenderness.  ?Genitourinary: ?   Comments: No evidence of urinary or rectal prolapse. No hemmorhoids. There is evidence of dried blood in her diaper.  No masses appreciated on DRE.  No gross blood on DRE.  Small area of swelling on distal aspect of left gluteal region without tenderness.   ?Musculoskeletal:     ?   General: No swelling or tenderness.  ?   Cervical back: Normal range of motion and neck supple.  ?Skin: ?   General: Skin is warm and dry.  ?   Capillary Refill: Capillary refill takes less than 2 seconds.  ?   Coloration: Skin is not jaundiced.  ?   Findings: No bruising.  ?Neurological:  ?   General: No focal deficit present.  ?   Mental Status: She is alert.  ?   Cranial Nerves: No cranial nerve deficit.  ?   Sensory: No sensory deficit.  ?   Motor: No weakness.  ?   Coordination: Coordination normal.  ?Psychiatric:     ?   Mood and Affect: Mood normal.     ?   Behavior: Behavior normal.     ?   Thought Content: Thought content normal.     ?   Judgment: Judgment normal.  ? ? ?ED Results / Procedures / Treatments   ?Labs ?(all labs ordered are listed, but only abnormal results  are displayed) ?Labs Reviewed  ?CBC WITH DIFFERENTIAL/PLATELET - Abnormal; Notable for the following components:  ?    Result Value  ? WBC 18.1 (*)   ? RBC 3.73 (*)   ? Hemoglobin 9.7 (*)   ? HCT 32.3 (*)   ? RDW 20.2 (*)   ? Neutro Abs 15.9 (*)   ?  Abs Immature Granulocytes 0.13 (*)   ? All other components within normal limits  ?BASIC METABOLIC PANEL - Abnormal; Notable for the following components:  ? Glucose, Bld 107 (*)   ? Calcium 8.1 (*)   ? All other components within normal limits  ?POC OCCULT BLOOD, ED - Abnormal  ? ? ?EKG ?None ? ?Radiology ?CT PELVIS W CONTRAST ? ?Result Date: 08/21/2021 ?CLINICAL DATA:  Evaluate for abscess/fistula.  Hemorrhoids. EXAM: CT PELVIS WITH CONTRAST TECHNIQUE: Multidetector CT imaging of the pelvis was performed using the standard protocol following the bolus administration of intravenous contrast. RADIATION DOSE REDUCTION: This exam was performed according to the departmental dose-optimization program which includes automated exposure control, adjustment of the mA and/or kV according to patient size and/or use of iterative reconstruction technique. CONTRAST:  75mL OMNIPAQUE IOHEXOL 300 MG/ML  SOLN COMPARISON:  None. FINDINGS: Urinary Tract:  No abnormality visualized. Bowel: No dilated bowel loops are seen. There is sigmoid colon diverticulosis without evidence for acute diverticulitis. There is diffuse rectal wall thickening. No perianal fluid collection or fistula identified. Vascular/Lymphatic: There are atherosclerotic calcifications of the aorta and iliac arteries. Aorta is normal in size. No enlarged lymph nodes are seen. Reproductive: Uterus is surgically absent. Adnexa are within normal limits. Other: Small amount of free fluid in the pelvis. No focal hernia. There is diffuse subcutaneous edema. Musculoskeletal: There is a 2.1 x 3.4 by 4.6 cm fluid collection lateral to and adjacent to the right greater trochanter without significant surrounding enhancement or soft  tissue gas. The bones are osteopenic. Degenerative changes affect the spine. There is a healed right inferior pubic ramus fracture. IMPRESSION: 1. Rectal soft tissue thickening worrisome for proctitis. No evidence for a

## 2021-10-03 ENCOUNTER — Ambulatory Visit: Payer: Medicare Other

## 2021-10-03 ENCOUNTER — Ambulatory Visit (INDEPENDENT_AMBULATORY_CARE_PROVIDER_SITE_OTHER): Payer: Medicare Other | Admitting: Podiatry

## 2021-10-03 DIAGNOSIS — Q6680 Congenital vertical talus deformity, unspecified foot: Secondary | ICD-10-CM

## 2021-10-03 DIAGNOSIS — M21962 Unspecified acquired deformity of left lower leg: Secondary | ICD-10-CM

## 2021-10-03 DIAGNOSIS — Z872 Personal history of diseases of the skin and subcutaneous tissue: Secondary | ICD-10-CM

## 2021-10-03 DIAGNOSIS — M2141 Flat foot [pes planus] (acquired), right foot: Secondary | ICD-10-CM

## 2021-10-03 NOTE — Progress Notes (Signed)
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 Acquired deformity of left ankle  M21.962     3. Pes planus of both feet  M21.41    M21.42     4. History of foot ulcer  Z87.2         GOALS, NECESSITIES, & JUSTIFICATIONS Recommended Device: [place] AFO EC Neurowalker Color:    Black Closure:   Velcro  Laterality HCPCS Code Description Justification  left [id number] Plastic orthosis, custom molded from a model of the patient, custom fabricated, includes casting and cast preparation. Necessary to provide triplanar support to the foot/ankle complex  left L2330 Addition to lower extremity, lacer molded to patient model Necessary to ensure secure hold of orthosis to patient's limb  left L2820 Addition to lower extremity orthosis, soft interface for molded plastic below knee section Necessary to relieve pressure on bony prominences  left L3400 Metatarsal bar wedge, rocker Necessary to allow for roll-over in single limb stance with a locked ankle    I certify that [name] qualifies for and will benefit from an ankle foot orthosis used during ambulation based on meeting all of the following criteria;   The patient is: - Ambulatory, and - Has weakness or deformity of the foot and ankle, and - Requires stabilization for medical reasons, and - Has the potential to benefit functionally  The patient's medical record contains sufficient documentation of the patients medical condition to substantiate the  necessity for the type and quantity of the items ordered.  The goals of this therapy: - Improve Mobility - Improve Lower Extremity Stability - Decrease Pain - Facilitate Soft Tissue Healing - Facilitate Immobilization, healing and treatment of an injury  Necessity of Ankle Foot Orthotic molded to patient model: A custom (vs. prefabricated) ankle foot orthosis has been prescribed based on the following criteria which are specific to the condition of this patient; - The patient could not be fit with a prefabricated AFO - The condition necessitating the orthosis is expected to be permanent or of longstanding duration (more than 6 months) - There is need to control the ankle or foot in more than one plane - The patient has a documented neurological, circulatory, or orthopedic condition that requires custom fabrication over a model to prevent tissue injury - The patient has a healing fracture that lacks normal anatomical integrity or anthropometric proportions  I hereby certify that the ankle foot orthotic described above is a rigid or semi-rigid device which is used for the purpose of supporting a weak or deformed body member or restricting or eliminating motion in a diseased or injured part of the body. It is designed to provide support and counterforce on the limb or body part that is being braced. In my opinion, the custom molded ankle foot orthosis is both reasonable and necessary in reference to accepted standards of medical practice in the treatment  of the patient condition and rehabilitation.  ACTIONS PERFORMED Patient was evaluated and casted for Specialty  AFO via [employer] Cast[employer] Sock. Procedure was explained to patient and caregiver. Patient tolerated procedure. patient and caregiver selected device color and closure method.   PLAN Patient to return in four to six weeks for fitting and delivery of device. Plan of care was explained to and agreed upon by patient and caregiver. All questions  were answered and concerns addressed.

## 2021-10-06 ENCOUNTER — Encounter: Payer: Self-pay | Admitting: Podiatry

## 2021-10-06 NOTE — Progress Notes (Signed)
  Subjective:  Patient ID: [name], female    DOB: 1940-06-11,  MRN: [id number]  Chief Complaint  Patient presents with   Wound Check    WOUND CARE; LEFT FOOT    81 y.o. female presents with the above complaint. History confirmed with patient.  She is here with a care provider.  She lives in assisted nursing facility.  She is on Eliquis.  Interval history: The wounds remain healed  Objective:  Physical Exam: warm, good capillary refill, no trophic changes or ulcerative lesions, normal DP and PT pulses, and normal sensory exam. Left Foot: Prominent bone and plantar midfoot the ulcerations have remained completely healed..  She has severe rocker-bottom foot deformity with prominence of the likely cuboid and talonavicular joint on the plantar surface underlying the previous ulcer spot     Assessment:   1. Rocker bottom foot deformity   2. History of foot ulcer   3. Acquired deformity of left ankle      Plan:  Patient was evaluated and treated and all questions answered.  Ulcer left foot -Overall doing well the wounds remained healed.  We again discussed recurrence.  She was casted for an AFO offloading brace neuropathic walker today to accommodate her rocker-bottom foot deformity as well as reduce the risk of recurrence of ulceration.  I will see her back in 2 months for follow-up hopefully brace is completed by then.  For now continue WBAT in the surgical shoe with offloading insert.       No follow-ups on file.

## 2021-10-31 ENCOUNTER — Ambulatory Visit: Payer: Medicare Other | Admitting: General Surgery

## 2021-11-16 ENCOUNTER — Encounter: Payer: Self-pay | Admitting: Surgery

## 2021-11-16 ENCOUNTER — Ambulatory Visit (INDEPENDENT_AMBULATORY_CARE_PROVIDER_SITE_OTHER): Payer: Medicare Other | Admitting: Surgery

## 2021-11-16 VITALS — BP 124/77 | HR 76 | Temp 98.3°F | Resp 16

## 2021-11-16 DIAGNOSIS — K640 First degree hemorrhoids: Secondary | ICD-10-CM

## 2021-11-16 MED ORDER — DOCUSATE SODIUM 100 MG PO CAPS: 100.0000 mg | ORAL_CAPSULE | Freq: Every day | ORAL | 0 refills | Status: DC

## 2021-11-16 MED ORDER — POLYETHYLENE GLYCOL 3350 17 G PO PACK: 17.0000 g | PACK | Freq: Every day | ORAL | 0 refills | Status: AC | PRN

## 2021-11-16 NOTE — Patient Instructions (Signed)
-  Take Colace (stool softener) 100 mg daily  -Take Miralax as needed for constipation -No need for surgery for hemorrhoids at this time -Call if having further issues with rectal pain

## 2021-11-17 NOTE — Progress Notes (Signed)
[facility] History and Physical  Reason for Referral: Rectal pain  Referring Physician: Lubertha Sayres, FNP  Chief Complaint   New Patient (Initial Visit)     [name] is a 81 y.o. female.  HPI: Patient presents for evaluation for rectal pain.  She had a visit to the emergency department at [facility] on 5/1 for evaluation of rectal pain.  At that time she was noted to have no significant abnormalities or hemorrhoids on examination and was thought to have had a rectal prolapse that had since reduced.  She had underwent a CT of the pelvis, which demonstrated rectal soft tissue thickening worrisome for proctitis, no evidence of abscess or fistula, and fluid collection adjacent to right greater trochanter.  Since that time, she does not believe that she has been having any further rectal pain.  She has been having normal bowel movements without any blood.  She currently denies abdominal pain, nausea, and vomiting.  She does have dementia and therefore is not a great historian.  She takes Eliquis and baby aspirin daily.  Her surgical history significant for cholecystectomy.  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 Allergies as of 11/16/2021        Reactions   Sulfa Antibiotics Other (See Comments)   Don't remember reaction        Medication List        Accurate as of November 16, 2021 11:59 PM. If you have any questions, ask your nurse or doctor.          STOP taking these medications    cefdinir 300 MG capsule Commonly known as: OMNICEF Stopped by: Mariel Gaudin A Asencion Guisinger, DO   cholecalciferol 25 MCG (1000 UNIT) tablet Commonly known as: VITAMIN D3 Stopped by: Keyleigh Manninen A Naviah Belfield, DO   magnesium oxide 400 (240 Mg) MG tablet Commonly known as: MAG-OX Stopped by: Clorinda Wyble A Kasi Lasky, DO       TAKE these medications    apixaban 5 MG Tabs tablet Commonly known as: ELIQUIS Take 5 mg by mouth 2 (two) times daily.   aspirin EC 81 MG tablet Take 81 mg by mouth daily.   atorvastatin 20 MG tablet Commonly known as: LIPITOR Take 20 mg by mouth daily.   Biofreeze 4 % Gel Generic drug: Menthol (Topical Analgesic) Apply 1 application topically See admin instructions. Apply to right shoulder twice a day   calcium carbonate 500 MG chewable tablet Commonly known as: TUMS - dosed in mg elemental calcium Chew 1 tablet by mouth 2 (two) times daily with a meal.   clindamycin 150 MG  capsule Commonly known as: CLEOCIN Take 150 mg by mouth in the morning and at bedtime.   clobetasol 0.05 % external solution Commonly known as: TEMOVATE Apply 1 Application topically every other day.   diltiazem 240 MG 24 hr capsule Commonly known as: CARDIZEM CD Take 1 capsule (240 mg total) by mouth daily.   docusate sodium 100 MG capsule Commonly known as: Colace Take 1 capsule (100 mg total) by mouth daily. Started by: Gustavus Messing Malic Rosten, DO   escitalopram 5 MG tablet Commonly known as: LEXAPRO Take 2.5 mg by mouth at bedtime.   ferrous sulfate 325 (65 FE) MG tablet Take 325 mg by mouth daily with breakfast.   furosemide 20 MG tablet Commonly known as: LASIX Take 20 mg by mouth 2 (two) times daily.    levothyroxine 112 MCG tablet Commonly known as: SYNTHROID Take 112 mcg by mouth daily before breakfast. What changed: Another medication with the same name was removed. Continue taking this medication, and follow the directions you see here. Changed by: Selah Zelman A Sly Parlee, DO   Metoprolol Tartrate 75 MG Tabs Take 75 mg by mouth 2 (two) times daily.   mirtazapine 15 MG tablet Commonly known as: REMERON Take 15 mg by mouth at bedtime.   omeprazole 20 MG capsule Commonly known as: PRILOSEC Take 20 mg by mouth daily.   polyethylene glycol 17 g packet Commonly known as: MIRALAX / GLYCOLAX Take 17 g by mouth daily as needed for mild constipation or moderate constipation. Started by: Gustavus Messing Shelvy Perazzo, DO   promethazine 12.5 MG tablet Commonly known as: PHENERGAN Take 12.5 mg by mouth daily as needed for nausea or vomiting.   ramipril 2.5 MG capsule Commonly known as: ALTACE Take 1 capsule (2.5 mg total) by mouth daily.   SYSTANE BALANCE OP Place 1 drop into both eyes 2 (two) times daily.   vitamin B-12 500 MCG tablet Commonly known as: CYANOCOBALAMIN Take 500 mcg by mouth daily.         ROS:  Constitutional: negative for chills, fatigue, and fevers Eyes: negative for visual disturbance and pain Ears, nose, mouth, throat, and face: negative for ear drainage, sore throat, and sinus problems Respiratory: negative for cough, wheezing, and shortness of breath Cardiovascular: negative for chest pain and palpitations Gastrointestinal: negative for abdominal pain, nausea, reflux symptoms, and vomiting Genitourinary:negative for dysuria, frequency, and urinary retention Integument/breast: negative for dryness and rash Hematologic/lymphatic: negative for bleeding and lymphadenopathy Musculoskeletal:negative for back pain, neck pain, and joint pain Neurological: negative for dizziness, tremors, and weakness Endocrine: negative for temperature intolerance  Blood  pressure 124/77, pulse 76, temperature 98.3 F (36.8 C), temperature source Oral, resp. rate 16, SpO2 95 %. Physical Exam Vitals reviewed.  Constitutional:      Appearance: Normal appearance.  HENT:     Head: Normocephalic and atraumatic.  Eyes:     Extraocular Movements: Extraocular movements intact.     Pupils: Pupils are equal, round, and reactive to light.  Cardiovascular:     Rate and Rhythm: Normal rate and regular rhythm.  Pulmonary:     Effort: Pulmonary effort is normal.     Breath sounds: Normal breath sounds.  Abdominal:     General: There is no distension.     Palpations: Abdomen is soft.     Tenderness: There is no abdominal tenderness.  Genitourinary:    Comments: Digital rectal exam performed with normal anorectal tone, small internal hemorrhoids and external hemorrhoid skin tags noted, no evidence of thrombosed  hemorrhoid, no palpable masses or blood noted Musculoskeletal:        General: Normal range of motion.     Cervical back: Normal range of motion.  Skin:    General: Skin is warm and dry.  Neurological:     General: No focal deficit present.     Mental Status: She is alert. Mental status is at baseline.  Psychiatric:        Mood and Affect: Mood normal.        Behavior: Behavior normal.     Results: No results found for this or any previous visit (from the past 48 hour(s)).  No results found.   Assessment & Plan:  [name] is a 81 y.o. female who presents for evaluation of rectal pain.  -Patient's symptoms of rectal pain have since resolved from her ED evaluation.  While she does have hemorrhoids on rectal exam noted, if she is not currently tender from these hemorrhoids, would would recommend against any surgical intervention -Recommend bowel regimen to keep her stools regular.  Prescriptions provided for daily Colace 100 mg and PRN MiraLAX for constipation -Advised her to call if she is having further issues of rectal pain -Follow up as  needed  All questions were answered to the satisfaction of the patient and nurse tech.   Theophilus Kinds, DO [facility] [address] [place] [place], [place] [address] [phone] (office)

## 2021-12-05 ENCOUNTER — Ambulatory Visit (INDEPENDENT_AMBULATORY_CARE_PROVIDER_SITE_OTHER): Payer: Medicare Other | Admitting: Podiatry

## 2021-12-05 ENCOUNTER — Ambulatory Visit (INDEPENDENT_AMBULATORY_CARE_PROVIDER_SITE_OTHER): Payer: Medicare Other

## 2021-12-05 DIAGNOSIS — R609 Edema, unspecified: Secondary | ICD-10-CM

## 2021-12-05 DIAGNOSIS — R600 Localized edema: Secondary | ICD-10-CM

## 2021-12-05 DIAGNOSIS — M19072 Primary osteoarthritis, left ankle and foot: Secondary | ICD-10-CM | POA: Diagnosis not present

## 2021-12-05 NOTE — Progress Notes (Signed)
  Subjective:  Patient ID: [name], female    DOB: 1941-02-27,  MRN: [id number]  Chief Complaint  Patient presents with   Foot Problem    swelling in left foot no known injury    81 y.o. female presents with the above complaint. History confirmed with patient.  She is here with a care provider.  She lives in assisted nursing facility.  She is on Eliquis.  Interval history: She developed swelling its not painful she did not injure it or fall on it.  Slowly has been developing.  Her records indicate she has been started on Lasix for this  Objective:  Physical Exam: warm, good capillary refill, no trophic changes or ulcerative lesions, normal DP and PT pulses, and normal sensory exam. Left Foot: +1 pitting edema throughout the left lower extremity, negative Homan or Pratt sign.  No evidence of DVT.  No tenderness   Radiographs show significant midfoot arthritis, prior failed flatfoot reconstruction  Assessment:   1. Peripheral edema   2. Arthritis of left foot      Plan:  Patient was evaluated and treated and all questions answered.  We discussed etiology and treatment options of peripheral edema and how this relates to arthritis.  She is on Lasix and this may help to alleviate some of this.  I recommend elevating the legs when off of them as well as compression stockings.  They will be working this and I put this in my recommendations to the facility.   Return if symptoms worsen or fail to improve.

## 2021-12-22 ENCOUNTER — Encounter (HOSPITAL_COMMUNITY): Payer: Self-pay

## 2021-12-22 ENCOUNTER — Other Ambulatory Visit: Payer: Self-pay

## 2021-12-22 ENCOUNTER — Emergency Department (HOSPITAL_COMMUNITY)
Admission: EM | Admit: 2021-12-22 | Discharge: 2021-12-22 | Disposition: A | Discharge status: Skilled Nursing Facility | Payer: Medicare Other | Attending: Emergency Medicine | Admitting: Emergency Medicine

## 2021-12-22 DIAGNOSIS — Z7901 Long term (current) use of anticoagulants: Secondary | ICD-10-CM | POA: Insufficient documentation

## 2021-12-22 DIAGNOSIS — Z7982 Long term (current) use of aspirin: Secondary | ICD-10-CM | POA: Insufficient documentation

## 2021-12-22 DIAGNOSIS — K649 Unspecified hemorrhoids: Secondary | ICD-10-CM

## 2021-12-22 DIAGNOSIS — K644 Residual hemorrhoidal skin tags: Secondary | ICD-10-CM | POA: Insufficient documentation

## 2021-12-22 DIAGNOSIS — K625 Hemorrhage of anus and rectum: Secondary | ICD-10-CM | POA: Diagnosis present

## 2021-12-22 DIAGNOSIS — F0390 Unspecified dementia without behavioral disturbance: Secondary | ICD-10-CM | POA: Diagnosis not present

## 2021-12-22 LAB — CBC WITH DIFFERENTIAL/PLATELET
Abs Immature Granulocytes: 0.05 10*3/uL (ref 0.00–0.07)
Basophils Absolute: 0 10*3/uL (ref 0.0–0.1)
Basophils Relative: 0 %
Eosinophils Absolute: 0 10*3/uL (ref 0.0–0.5)
Eosinophils Relative: 0 %
HCT: 34.9 % — ABNORMAL LOW (ref 36.0–46.0)
Hemoglobin: 10.5 g/dL — ABNORMAL LOW (ref 12.0–15.0)
Immature Granulocytes: 0 %
Lymphocytes Relative: 5 %
Lymphs Abs: 0.7 10*3/uL (ref 0.7–4.0)
MCH: 25.2 pg — ABNORMAL LOW (ref 26.0–34.0)
MCHC: 30.1 g/dL (ref 30.0–36.0)
MCV: 83.7 fL (ref 80.0–100.0)
Monocytes Absolute: 1.5 10*3/uL — ABNORMAL HIGH (ref 0.1–1.0)
Monocytes Relative: 10 %
Neutro Abs: 13.3 10*3/uL — ABNORMAL HIGH (ref 1.7–7.7)
Neutrophils Relative %: 85 %
Platelets: 241 10*3/uL (ref 150–400)
RBC: 4.17 MIL/uL (ref 3.87–5.11)
RDW: 18.1 % — ABNORMAL HIGH (ref 11.5–15.5)
WBC: 15.7 10*3/uL — ABNORMAL HIGH (ref 4.0–10.5)
nRBC: 0 % (ref 0.0–0.2)

## 2021-12-22 LAB — COMPREHENSIVE METABOLIC PANEL
ALT: 14 U/L (ref 0–44)
AST: 22 U/L (ref 15–41)
Albumin: 3.4 g/dL — ABNORMAL LOW (ref 3.5–5.0)
Alkaline Phosphatase: 96 U/L (ref 38–126)
Anion gap: 9 (ref 5–15)
BUN: 27 mg/dL — ABNORMAL HIGH (ref 8–23)
CO2: 26 mmol/L (ref 22–32)
Calcium: 8.9 mg/dL (ref 8.9–10.3)
Chloride: 104 mmol/L (ref 98–111)
Creatinine, Ser: 0.99 mg/dL (ref 0.44–1.00)
GFR, Estimated: 58 mL/min — ABNORMAL LOW (ref >60)
Glucose, Bld: 143 mg/dL — ABNORMAL HIGH (ref 70–99)
Potassium: 3.3 mmol/L — ABNORMAL LOW (ref 3.5–5.1)
Sodium: 139 mmol/L (ref 135–145)
Total Bilirubin: 0.8 mg/dL (ref 0.3–1.2)
Total Protein: 7 g/dL (ref 6.5–8.1)

## 2021-12-22 LAB — HEMOGLOBIN AND HEMATOCRIT, BLOOD
HCT: 33.6 % — ABNORMAL LOW (ref 36.0–46.0)
HCT: 33.1 % — ABNORMAL LOW (ref 36.0–46.0)
Hemoglobin: 10.2 g/dL — ABNORMAL LOW (ref 12.0–15.0)
Hemoglobin: 10.3 g/dL — ABNORMAL LOW (ref 12.0–15.0)

## 2021-12-22 LAB — TYPE AND SCREEN
ABO/RH(D): A POS
Antibody Screen: NEGATIVE

## 2021-12-22 LAB — PROTIME-INR
INR: 1.9 — ABNORMAL HIGH (ref 0.8–1.2)
Prothrombin Time: 21.8 seconds — ABNORMAL HIGH (ref 11.4–15.2)

## 2021-12-22 MED ORDER — HYDROCORTISONE (PERIANAL) 2.5 % EX CREA: 1.0000 | TOPICAL_CREAM | Freq: Two times a day (BID) | CUTANEOUS | 0 refills | Status: AC

## 2021-12-22 NOTE — Discharge Instructions (Addendum)
[name] has bleeding hemorrhoids on her exam today.  I recommend that you HOLD eliquis for today and tomorrow (12/22/21 and 12/23/21) and then resume the eliquis.  I also recommend you apply the hemorrhoid cream to her anus.  Please have her PCP or facility provider recheck her hemoglobin level in 2 days.  Her hemoglobin was stable on repeat checks here at 10.5 and 10.3 after multiple hours.

## 2021-12-22 NOTE — ED Provider Notes (Signed)
[facility] EMERGENCY DEPARTMENT Provider Note   CSN: [id number] Arrival date & time: 12/22/21  3299     History  Chief Complaint  Patient presents with   Rectal Bleeding    [name] is a 81 y.o. female who is on Eliquis presenting from nursing facility with concern for bright red blood in her stool.  They noticed this this morning.  The patient has no acute complaints, does not feel lightheaded, does not have abdominal pain.  She does have a history of dementia and cannot provide a reliable history otherwise.  HPI     Home Medications Prior to Admission medications   Medication Sig Start Date End Date Taking? Authorizing Provider  apixaban (ELIQUIS) 5 MG TABS tablet Take 5 mg by mouth 2 (two) times daily.   Yes [provider]  aspirin EC 81 MG tablet Take 81 mg by mouth daily.   Yes [provider]  atorvastatin (LIPITOR) 20 MG tablet Take 20 mg by mouth daily.   Yes [provider]  calcium carbonate (TUMS - DOSED IN MG ELEMENTAL CALCIUM) 500 MG chewable tablet Chew 1 tablet by mouth 2 (two) times daily with a meal.   Yes [provider]  clobetasol (TEMOVATE) 0.05 % external solution Apply 1 Application topically every other day.   Yes [provider]  diltiazem (CARDIZEM CD) 240 MG 24 hr capsule Take 1 capsule (240 mg total) by mouth daily. 08/14/21  Yes Tat, Onalee Hua, MD  docusate sodium (COLACE) 100 MG capsule Take 1 capsule (100 mg total) by mouth daily. 11/16/21 11/16/22 Yes Pappayliou, Santina Evans A, DO  escitalopram (LEXAPRO) 5 MG tablet Take 2.5 mg by mouth at bedtime.   Yes [provider]  ferrous sulfate 325 (65 FE) MG tablet Take 325 mg by mouth daily with breakfast.   Yes [provider]  furosemide (LASIX) 20 MG tablet Take 20 mg by mouth 2 (two) times daily.   Yes [provider]  hydrocortisone (ANUSOL-HC) 2.5 % rectal cream Place 1 Application rectally 2 (two) times daily for 10 days. 12/22/21  01/01/22 Yes Cid Agena, Kermit Balo, MD  levothyroxine (SYNTHROID) 112 MCG tablet Take 112 mcg by mouth daily before breakfast.   Yes [provider]  Menthol, Topical Analgesic, (BIOFREEZE) 4 % GEL Apply 1 application topically See admin instructions. Apply to right shoulder twice a day   Yes [provider]  Metoprolol Tartrate 75 MG TABS Take 75 mg by mouth 2 (two) times daily. 08/14/21  Yes Tat, Onalee Hua, MD  mirtazapine (REMERON) 15 MG tablet Take 15 mg by mouth at bedtime.   Yes [provider]  omeprazole (PRILOSEC) 20 MG capsule Take 20 mg by mouth daily.   Yes [provider]  polyethylene glycol (MIRALAX / GLYCOLAX) 17 g packet Take 17 g by mouth daily as needed for mild constipation or moderate constipation. 11/16/21  Yes Pappayliou, Santina Evans A, DO  promethazine (PHENERGAN) 12.5 MG tablet Take 12.5 mg by mouth daily as needed for nausea or vomiting.   Yes [provider]  Propylene Glycol (SYSTANE BALANCE OP) Place 1 drop into both eyes 2 (two) times daily.   Yes [provider]  ramipril (ALTACE) 2.5 MG capsule Take 1 capsule (2.5 mg total) by mouth daily. 02/05/17  Yes Dyann Kief, PA-C  vitamin B-12 (CYANOCOBALAMIN) 500 MCG tablet Take 500 mcg by mouth daily.   Yes [provider]      Allergies    Sulfa antibiotics  Review of Systems   Review of Systems  Physical Exam Updated Vital Signs BP (!) 141/62   Pulse 82   Temp 97.7 F (36.5 C) (Oral)   Resp 18   Ht 5\' 3"  (1.6 m)   Wt 63 kg   SpO2 99%   BMI 24.60 kg/m  Physical Exam Constitutional:      General: She is not in acute distress. HENT:     Head: Normocephalic and atraumatic.  Eyes:     Conjunctiva/sclera: Conjunctivae normal.     Pupils: Pupils are equal, round, and reactive to light.  Cardiovascular:     Rate and Rhythm: Normal rate and regular rhythm.  Pulmonary:     Effort: Pulmonary effort is normal. No respiratory distress.  Genitourinary:     Comments: Patient does have some external hemorrhoids which are bleeding and irritated, bright red blood mixed with stool, but also blood on internal digital rectal exam Skin:    General: Skin is warm and dry.  Neurological:     General: No focal deficit present.     Mental Status: She is alert. Mental status is at baseline.  Psychiatric:        Mood and Affect: Mood normal.        Behavior: Behavior normal.     ED Results / Procedures / Treatments   Labs (all labs ordered are listed, but only abnormal results are displayed) Labs Reviewed  CBC WITH DIFFERENTIAL/PLATELET - Abnormal; Notable for the following components:      Result Value   WBC 15.7 (*)    Hemoglobin 10.5 (*)    HCT 34.9 (*)    MCH 25.2 (*)    RDW 18.1 (*)    Neutro Abs 13.3 (*)    Monocytes Absolute 1.5 (*)    All other components within normal limits  COMPREHENSIVE METABOLIC PANEL - Abnormal; Notable for the following components:   Potassium 3.3 (*)    Glucose, Bld 143 (*)    BUN 27 (*)    Albumin 3.4 (*)    GFR, Estimated 58 (*)    All other components within normal limits  PROTIME-INR - Abnormal; Notable for the following components:   Prothrombin Time 21.8 (*)    INR 1.9 (*)    All other components within normal limits  HEMOGLOBIN AND HEMATOCRIT, BLOOD - Abnormal; Notable for the following components:   Hemoglobin 10.2 (*)    HCT 33.6 (*)    All other components within normal limits  HEMOGLOBIN AND HEMATOCRIT, BLOOD - Abnormal; Notable for the following components:   Hemoglobin 10.3 (*)    HCT 33.1 (*)    All other components within normal limits  TYPE AND SCREEN    EKG None  Radiology No results found.  Procedures Procedures    Medications Ordered in ED Medications - No data to display  ED Course/ Medical Decision Making/ A&P Clinical Course as of 12/22/21 1704  Fri Dec 22, 2021  1302 Second hemoglobin was drawn a little early, shows 10.2 which is within the margin of error on  repeat hgb level.  We'll recheck at 3 pm [MT]    Clinical Course User Index [MT] Rasheen Bells, Dec 24, 2021, MD                           Medical Decision Making Amount and/or Complexity of Data Reviewed Labs: ordered.  Risk Prescription drug management.   Blood in stool/diapers  Ddx  includes bleeding hemorrhoids vs diverticular bleed vs other Blood is bright consistent with lower GI source Pt observed for period of 7 hours in ED, remained stable with vitals, repeat hgb trended and stable.  I suspect this is likely bleeding hemorrhoid which I can visualize Recommend holding eliquis 1-2 days and application of proctocream   Hospitalization was considered but I believe this is a relatively minor bleed and stable for close outpatient f/u.  Encouraged facility to have patient hgb rechecked in 1-2 days.  Remainder of labs personally reviewed and no emergent findings Chronic WBC elevation noted - no clear nidus of infection; no significant rectal tenderness or fluctuance to suspect abscess.  No GI pain to suspect diverticulitis.        Final Clinical Impression(s) / ED Diagnoses Final diagnoses:  Hemorrhoids, unspecified hemorrhoid type    Rx / DC Orders ED Discharge Orders          Ordered    hydrocortisone (ANUSOL-HC) 2.5 % rectal cream  2 times daily        12/22/21 1539              Breton Berns, Kermit Balo, MD 12/22/21 1706

## 2021-12-22 NOTE — ED Triage Notes (Signed)
BIB by [facility] with complaints of N/V/D that started this am around 0230. Staff reports that patient had bright red blood in stool prompting ED eval. Patient reports hx of same. Denies any symptoms at this time. Per facility staff patient has Dementia. Currently on Eliquis. Only complaint to EMS was generalized weakness.

## 2022-01-04 ENCOUNTER — Telehealth: Payer: Self-pay | Admitting: *Deleted

## 2022-01-04 NOTE — Telephone Encounter (Signed)
Received call from facility that patient is currently at.   Reports that patient noted increased pain and blood at rectum. States that patient was seen in ER and advised to follow up with PCP. States that facility NP evaluated patient and recommended follow up to assess if excision is appropriate.   Appointment scheduled.

## 2022-01-05 NOTE — Telephone Encounter (Signed)
Noted  

## 2022-01-09 ENCOUNTER — Encounter: Payer: Self-pay | Admitting: Surgery

## 2022-01-09 ENCOUNTER — Telehealth: Payer: Self-pay | Admitting: Gastroenterology

## 2022-01-09 ENCOUNTER — Ambulatory Visit (INDEPENDENT_AMBULATORY_CARE_PROVIDER_SITE_OTHER): Payer: Medicare Other | Admitting: Surgery

## 2022-01-09 VITALS — BP 118/76 | HR 77 | Temp 97.9°F | Resp 14 | Ht 63.0 in | Wt 130.0 lb

## 2022-01-09 DIAGNOSIS — K640 First degree hemorrhoids: Secondary | ICD-10-CM

## 2022-01-09 NOTE — Telephone Encounter (Signed)
Mandy:  Can you arrange a new patient appt for me due to hemorrhoids? Thanks!

## 2022-01-10 NOTE — Progress Notes (Signed)
[facility] Note   HPI:  81 y.o. Female presents to clinic for follow-up of hemorrhoids.  She was recently sent to the emergency department by her facility for rectal bleeding.  Her hemoglobin was noted to be stable at that time, and she was discharged with follow-up with her primary care doctor.  Her primary care doctor subsequently referred her to me for evaluation for hemorrhoid excision.  She denies any pain with bowel movements, and is currently having daily bowel movements.  She also denies any bleeding with bowel movements since evaluation in the ED.  She is taking Eliquis and an 80mg  aspirin daily.  Review of Systems:  All other review of systems: otherwise negative   Vital Signs:  BP 118/76   Pulse 77   Temp 97.9 F (36.6 C) (Oral)   Resp 14   Ht 5\' 3"  (1.6 m)   Wt 130 lb (59 kg)   SpO2 90%   BMI 23.03 kg/m    Physical Exam:  Physical Exam Vitals reviewed.  Constitutional:      Appearance: Normal appearance.  Abdominal:     General: There is no distension.     Palpations: Abdomen is soft.     Tenderness: There is no abdominal tenderness.  Genitourinary:    Comments: Small external hemorrhoid skin tags noted without evidence of thrombosis or bleeding Neurological:     Mental Status: She is alert.    Laboratory studies: None   Imaging:  None  Assessment:  81 y.o. yo Female who presents for evaluation of hemorrhoids.  Plan:  -I again explained to the patient that hemorrhoid surgery is one of the most painful surgeries that I perform.  Given that she has no pain related to her hemorrhoids, I would recommend against hemorrhoid excision -Since she had a small amount of bleeding, she may benefit from hemorrhoidal banding, which is a much more tolerated procedure than a full hemorrhoid excision -Referral sent to GI, they will call the patient directly to schedule an appointment -Follow up as needed  All of the above recommendations were discussed  with the patient and patient's caregiver, and all of patient's and caregiver's questions were answered to their expressed satisfaction.  Graciella Freer, DO [facility] [address] [place], [place] [address] [phone] (office)

## 2022-02-07 ENCOUNTER — Ambulatory Visit: Payer: Medicare Other | Admitting: Gastroenterology

## 2022-02-21 ENCOUNTER — Ambulatory Visit (INDEPENDENT_AMBULATORY_CARE_PROVIDER_SITE_OTHER): Payer: Medicare Other

## 2022-02-21 DIAGNOSIS — M19072 Primary osteoarthritis, left ankle and foot: Secondary | ICD-10-CM

## 2022-02-21 DIAGNOSIS — Q6680 Congenital vertical talus deformity, unspecified foot: Secondary | ICD-10-CM

## 2022-02-21 DIAGNOSIS — M21962 Unspecified acquired deformity of left lower leg: Secondary | ICD-10-CM

## 2022-02-21 DIAGNOSIS — R609 Edema, unspecified: Secondary | ICD-10-CM

## 2022-02-21 NOTE — Progress Notes (Signed)
Patient in office today to pickup Specialty AFO brace.   Patient tried on the [facility] Neurowalker black in color with Velcro. Patient was able to ambulate around the room without difficult.   Patient reports being satisfied with the fit and feel of the speciality AFO brace.   Advised patient and care give to call the office with any questions, comments, or concerns. Patient and caregiver both verbalized understanding.   Order # [id number]

## 2022-03-13 ENCOUNTER — Ambulatory Visit: Payer: Medicare Other | Admitting: Gastroenterology

## 2022-04-26 ENCOUNTER — Ambulatory Visit (INDEPENDENT_AMBULATORY_CARE_PROVIDER_SITE_OTHER): Payer: Medicare Other

## 2022-04-26 DIAGNOSIS — Q6680 Congenital vertical talus deformity, unspecified foot: Secondary | ICD-10-CM

## 2022-05-01 ENCOUNTER — Ambulatory Visit: Payer: Medicare Other | Admitting: Podiatry

## 2022-05-02 ENCOUNTER — Telehealth: Payer: Self-pay | Admitting: *Deleted

## 2022-05-02 ENCOUNTER — Ambulatory Visit (INDEPENDENT_AMBULATORY_CARE_PROVIDER_SITE_OTHER): Payer: Medicare Other | Admitting: Podiatry

## 2022-05-02 ENCOUNTER — Encounter: Payer: Self-pay | Admitting: Podiatry

## 2022-05-02 DIAGNOSIS — Q6680 Congenital vertical talus deformity, unspecified foot: Secondary | ICD-10-CM

## 2022-05-02 DIAGNOSIS — L97522 Non-pressure chronic ulcer of other part of left foot with fat layer exposed: Secondary | ICD-10-CM

## 2022-05-02 MED ORDER — CEPHALEXIN 500 MG PO CAPS: 500.0000 mg | ORAL_CAPSULE | Freq: Three times a day (TID) | ORAL | 0 refills | Status: DC

## 2022-05-02 MED ORDER — MUPIROCIN 2 % EX OINT: 1.0000 | TOPICAL_OINTMENT | Freq: Every day | CUTANEOUS | 2 refills | Status: DC

## 2022-05-02 NOTE — Progress Notes (Signed)
  Subjective:  Patient ID: [name], female    DOB: 1940-09-25,  MRN: [id number]  Chief Complaint  Patient presents with   Foot Ulcer    wound after wearing afo on Right foot    82 y.o. female presents with the above complaint. History confirmed with patient.  She returns for follow-up she has been wearing the AFO, her aide with her today discusses that she has been wearing the AFO nearly constantly even sleeping on it and has rubbed a sore on the great toe  Objective:  Physical Exam: warm, good capillary refill, no trophic changes or ulcerative lesions, normal DP and PT pulses, normal sensory exam, and left hallux IPJ dorsal medial there is a full-thickness ulceration measuring 0.7 x 1.0 x 0.5 cm.  Some erythema surrounding this.  Serous drainage.  No exposed bone tendon or joint cellulitis or purulence.  No malodor fibrous wound bed.     Assessment:   1. 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 Rocker bottom foot deformity      Plan:  Patient was evaluated and treated and all questions answered.  Regarding her AFO and deformity I discussed with her that I would have this adjusted by [facility] and a referral order was given to them to schedule this to be adjusted for extra padding and offloading of the great toe.  Long-term this has benefited her rocker-bottom deformity and plantar prominence and I think she will still need this long-term.  Ulcer left great toe -We discussed the etiology and factors that are a part of the wound healing process.  We also discussed the risk of infection both soft tissue and osteomyelitis from open ulceration.  Discussed the risk of limb loss if this happens or worsens. -Debridement as below. -Dressed with Iodosorb., DSD. -Continue home dressing changes daily with 4 x 4 gauze and mupirocin -Continue off-loading with surgical shoe and peg assist device.  Which she has -Last antibiotics: Rx Keflex sent to  pharmacy   Procedure: Excisional Debridement of Wound Rationale: Removal of non-viable soft tissue from the wound to promote healing.  Anesthesia: none Post-Debridement Wound Measurements: Noted above Type of Debridement: Sharp Excisional Tissue Removed: Non-viable soft tissue Depth of Debridement: subcutaneous tissue. Technique: Sharp excisional debridement to bleeding, viable wound base.  Dressing: Dry, sterile, compression dressing. Disposition: Patient tolerated procedure well.    No follow-ups on file.       No follow-ups on file.

## 2022-05-02 NOTE — Telephone Encounter (Signed)
[name] with [facility] is requesting orders for wound care and how often should the patient be seen? Please advise.

## 2022-05-03 NOTE — Progress Notes (Signed)
Patient presented to the office regarding her AFO brace. Her aide states that it has caused a wound. Dr Sherryle Lis was unable to see the patient today. Patients aide stated that they have taken her to see her PCP and she was given an oral antibiotic. Advised her to continue the oral medication and follow up with Dr Sherryle Lis. Appointment scheduled for 05/02/2022.

## 2022-05-03 NOTE — Telephone Encounter (Signed)
Order has been faxed over today

## 2022-05-21 ENCOUNTER — Ambulatory Visit: Payer: Medicare Other | Admitting: Podiatry

## 2022-06-12 ENCOUNTER — Encounter (HOSPITAL_BASED_OUTPATIENT_CLINIC_OR_DEPARTMENT_OTHER): Payer: Medicare Other | Attending: General Surgery | Admitting: General Surgery

## 2022-06-12 DIAGNOSIS — I48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 atrial fibrillation: Secondary | ICD-10-CM | POA: Insufficient documentation

## 2022-06-12 DIAGNOSIS — Z7901 Long term (current) use of anticoagulants: Secondary | ICD-10-CM | POA: Insufficient documentation

## 2022-06-12 DIAGNOSIS — M1990 Unspecified osteoarthritis, unspecified site: Secondary | ICD-10-CM | POA: Insufficient documentation

## 2022-06-12 DIAGNOSIS — I10 Essential (primary) hypertension: Secondary | ICD-10-CM | POA: Diagnosis not present

## 2022-06-12 DIAGNOSIS — L97522 Non-pressure chronic ulcer of other part of left foot with fat layer exposed: Secondary | ICD-10-CM | POA: Diagnosis present

## 2022-06-13 NOTE — Progress Notes (Addendum)
HODES, Sheretha ([id number]) [id number] Page 1 of 11 Visit Report for 06/12/2022 Chief Complaint Document Details Patient Name: Date of Service: [name] RO N 06/12/2022 12:30 PM Medical Record Number: [id number] Patient Account Number: [id number] Date of Birth/Sex: Treating RN: [birth year] (82 y.o. F) Primary Care Provider: Jenne Pane, [facility] Other Clinician: Referring Provider: Treating Provider/Extender: Devoria Albe, [facility] Weeks in Treatment: 0 Information Obtained from: Patient Chief Complaint Patient seen for complaints of Non-Healing Wound. Electronic Signature(s) Signed: 06/12/2022 1:44:10 PM By: Fredirick Maudlin MD FACS Entered By: Fredirick Maudlin on 06/12/2022 13:44:10 -------------------------------------------------------------------------------- Debridement Details Patient Name: Date of Service: [name] [facility] RO N 06/12/2022 12:30 PM Medical Record Number: [id number] Patient Account Number: [id number] Date of Birth/Sex: Treating RN: 1940-05-03 (82 y.o. Harlow Ohms Primary Care Provider: Jenne Pane, [facility] Other Clinician: Referring Provider: Treating Provider/Extender: Devoria Albe, [facility] Weeks in Treatment: 0 Debridement Performed for Assessment: Wound #1 Left,Dorsal T Great oe Performed By: Physician Fredirick Maudlin, MD Debridement Type: Debridement Level of Consciousness (Pre-procedure): Awake and Alert Pre-procedure Verification/Time Out Yes - 13:24 Taken: Start Time: 13:24 Pain Control: Lidocaine 4% T opical Solution T Area Debrided (L x W): otal 0.7 (cm) x 0.8 (cm) = 0.56 (cm) Tissue and other material debrided: Non-Viable, Slough, Slough Level: Non-Viable Tissue Debridement Description: Selective/Open Wound Instrument: Curette Bleeding: Minimum Hemostasis Achieved: Pressure Response to Treatment: Procedure was tolerated well Level of Consciousness (Post- Awake and Alert procedure): Post  Debridement Measurements of Total Wound Length: (cm) 0.7 Width: (cm) 0.8 Depth: (cm) 0.1 Volume: (cm) 0.044 Character of Wound/Ulcer Post Debridement: Improved Post Procedure Diagnosis Same as Pre-procedure Hellums, [place] ([id number]) [phone].pdf Page 2 of 11 Notes scribed for Dr. Celine Ahr by Adline Peals, RN Electronic Signature(s) Signed: 06/12/2022 3:07:11 PM By: Fredirick Maudlin MD FACS Signed: 06/12/2022 4:08:42 PM By: Adline Peals Entered By: Adline Peals on 06/12/2022 13:25:30 -------------------------------------------------------------------------------- Debridement Details Patient Name: Date of Service: [name] [facility] RO N 06/12/2022 12:30 PM Medical Record Number: [id number] Patient Account Number: [id number] Date of Birth/Sex: Treating RN: 09-Sep-1940 (82 y.o. Harlow Ohms Primary Care Provider: Jenne Pane, [facility] Other Clinician: Referring Provider: Treating Provider/Extender: Devoria Albe, [facility] Weeks in Treatment: 0 Debridement Performed for Assessment: Wound #2 Left,Lateral T Great oe Performed By: Physician Fredirick Maudlin, MD Debridement Type: Debridement Level of Consciousness (Pre-procedure): Awake and Alert Pre-procedure Verification/Time Out Yes - 13:24 Taken: Start Time: 13:24 Pain Control: Lidocaine 4% T opical Solution T Area Debrided (L x W): otal 0.5 (cm) x 0.5 (cm) = 0.25 (cm) Tissue and other material debrided: Non-Viable, Slough, Slough Level: Non-Viable Tissue Debridement Description: Selective/Open Wound Instrument: Curette Bleeding: Minimum Hemostasis Achieved: Pressure Response to Treatment: Procedure was tolerated well Level of Consciousness (Post- Awake and Alert procedure): Post Debridement Measurements of Total Wound Length: (cm) 1.9 Width: (cm) 1.5 Depth: (cm) 0.1 Volume: (cm) 0.224 Character of Wound/Ulcer Post Debridement: Improved Post Procedure Diagnosis Same as  Pre-procedure Notes scribed for Dr. Celine Ahr by Adline Peals, RN Electronic Signature(s) Signed: 06/12/2022 3:07:11 PM By: Fredirick Maudlin MD FACS Signed: 06/12/2022 4:08:42 PM By: Adline Peals Entered By: Adline Peals on 06/12/2022 13:27:18 -------------------------------------------------------------------------------- HPI Details Patient Name: Date of Service: [name], [facility] RO N 06/12/2022 12:30 PM Medical Record Number: [id number] Patient Account Number: [id number] Date of Birth/Sex: Treating RN: 1941-01-03 (82 y.o. F) Primary Care Provider: Jenne Pane, [facility] Other Clinician: IZARIA, [name] ([id number]) [id number] Page 3 of 11 Referring Provider: Treating Provider/Extender: Fredirick Maudlin SHO KES, [facility] Weeks in Treatment: 0  History of Present Illness HPI Description: ADMISSION 06/12/2022 This is an 82 year old woman with a history of CVA, crest syndrome, atrial fibrillation, rocker-bottom foot deformity. 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 She has been followed by podiatry for this and I am not entirely clear as to how she came to be referred here. She resides in an assisted living facility. It is not clear what they have been putting on her wound, but on intake, she was noted to have denuded skin on her medial third toe, as well as ulcers on her dorsal great toe and lateral great toe. There is slough accumulation on both of the toe ulcers. There was an odor noted at intake, but after her foot was washed, the odor dissipated. Her toes are folded on top of each other creating areas of abrasion and pockets for moisture collection, which seems to be the primary cause of her ulceration. Electronic Signature(s) Signed: 06/12/2022 1:49:14 PM By: Fredirick Maudlin MD FACS Previous Signature: 06/12/2022 1:47:50 PM Version By: Fredirick Maudlin MD FACS Entered By: Fredirick Maudlin on 06/12/2022  13:49:14 -------------------------------------------------------------------------------- Physical Exam Details Patient Name: Date of Service: [name] [facility] RO N 06/12/2022 12:30 PM Medical Record Number: [id number] Patient Account Number: [id number] Date of Birth/Sex: Treating RN: 1941-03-13 (82 y.o. F) Primary Care Provider: Jenne Pane, [facility] Other Clinician: Referring Provider: Treating Provider/Extender: Fredirick Maudlin SHO KES, [facility] Weeks in Treatment: 0 Constitutional . . . . No acute distress. Respiratory Normal work of breathing on room air. Notes 06/12/2022: She has denuded skin on her medial third toe, as well as ulcers on her dorsal great toe and lateral great toe. There is slough accumulation on both of the toe ulcers. Her toes are folded on top of each other creating areas of abrasion and pockets for moisture collection. Electronic Signature(s) Signed: 06/12/2022 1:50:22 PM By: Fredirick Maudlin MD FACS Previous Signature: 06/12/2022 1:50:09 PM Version By: Fredirick Maudlin MD FACS Entered By: Fredirick Maudlin on 06/12/2022 13:50:22 -------------------------------------------------------------------------------- Physician Orders Details Patient Name: Date of Service: [name] [facility] RO N 06/12/2022 12:30 PM Medical Record Number: [id number] Patient Account Number: [id number] Date of Birth/Sex: Treating RN: 28-Mar-1941 (82 y.o. 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 - Dr. Celine Ahr - room 2 Return A Anesthetic (In clinic) Topical Lidocaine 4% applied to wound bed Bathing/ Shower/ Hygiene May shower and wash wound with soap and water. - keep toes and in between very dry Wound Treatment Wound #1 - T Great oe Wound Laterality: Dorsal, Left Cleanser: Soap and Water Every Other Day/30 Days Discharge Instructions: May shower and wash wound with dial antibacterial soap and water prior to dressing change. Cleanser: Wound Cleanser Every Other Day/30 Days Discharge Instructions: Cleanse the wound with wound cleanser prior to applying a clean dressing using gauze sponges, not tissue or cotton balls. Prim Dressing: [place] 2x2 (in/in) Every  Other Day/30 Days ary Discharge Instructions: Apply to wound bed as instructed Secondary Dressing: Woven Gauze Sponge, Non-Sterile 4x4 in Every Other Day/30 Days Discharge Instructions: Apply over primary dressing as directed. Secured With: Scientist, forensic, Sterile 4x75 (in/in) Every Other Day/30 Days Discharge Instructions: Secure with stretch gauze as directed. Secured With: Transpore Surgical Tape, 2x10 (in/yd) Every Other Day/30 Days Discharge Instructions: Secure dressing with tape as directed. Wound #2 - T Great oe Wound Laterality: Left, Lateral Cleanser: Soap and Water Every Other Day/30 Days Discharge Instructions: May shower and wash wound with dial antibacterial soap and water prior to dressing change. Cleanser: Wound Cleanser Every Other Day/30 Days Discharge Instructions: Cleanse the wound with wound cleanser prior to applying a clean dressing using gauze sponges, not tissue or cotton balls. Prim Dressing: Sorbalgon AG Dressing 2x2 (in/in) Every Other  Day/30 Days ary Discharge Instructions: Apply to wound bed as instructed Secondary Dressing: Woven Gauze Sponge, Non-Sterile 4x4 in Every Other Day/30 Days Discharge Instructions: Apply over primary dressing as directed. Secured With: Scientist, forensic, Sterile 4x75 (in/in) Every Other Day/30 Days Discharge Instructions: Secure with stretch gauze as directed. Secured With: Transpore Surgical Tape, 2x10 (in/yd) Every Other Day/30 Days Discharge Instructions: Secure dressing with tape as directed. Wound #3 - T Third oe Wound Laterality: Left, Medial Cleanser: Soap and Water Every Other Day/30 Days Discharge Instructions: May shower and wash wound with dial antibacterial soap and water prior to dressing change. Cleanser: Wound Cleanser Every Other Day/30 Days Discharge Instructions: Cleanse the wound with wound cleanser prior to applying a clean dressing using gauze sponges, not tissue or cotton balls. Prim Dressing: Sorbalgon AG Dressing 2x2 (in/in) Every Other Day/30 Days ary Discharge Instructions: Apply to wound bed as instructed Secondary Dressing: Woven Gauze Sponge, Non-Sterile 4x4 in Every Other Day/30 Days Discharge Instructions: Apply over primary dressing as directed. Secured With: Scientist, forensic, Sterile 4x75 (in/in) Every Other Day/30 Days Discharge Instructions: Secure with stretch gauze as directed. Secured With: Transpore Surgical Tape, 2x10 (in/yd) Every Other Day/30 Days Discharge Instructions: Secure dressing with tape as directed. Joseph, Kathryn ([id number]) [id number] Page 5 of 11 Patient Medications llergies: Sulfa (Sulfonamide Antibiotics) A Notifications Medication Indication Start End 06/12/2022 lidocaine DOSE topical 4 % cream - cream topical Electronic Signature(s) Signed: 06/12/2022 3:07:11 PM By: Fredirick Maudlin MD FACS Entered By: Fredirick Maudlin on 06/12/2022  13:50:37 -------------------------------------------------------------------------------- Problem List Details Patient Name: Date of Service: [name] [facility] RO N 06/12/2022 12:30 PM Medical Record Number: [id number] Patient Account Number: [id number] Date of Birth/Sex: Treating RN: 13-Oct-1940 (82 y.o. 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 06/12/2022 No Yes 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 06/12/2022 No Yes 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 atrial fibrillation 06/12/2022 No Yes Z79.01 Long term (current) use of anticoagulants 06/12/2022 No Yes Inactive Problems Resolved Problems Electronic Signature(s) Signed: 06/12/2022 1:41:41 PM By: Fredirick Maudlin MD FACS Previous Signature: 06/12/2022 12:43:59 PM Version By: Fredirick Maudlin MD FACS Entered By: Fredirick Maudlin on 06/12/2022 [name], [name] ([id number] Page 6 of 11 --------------------------------------------------------------------------------  Progress Note Details Patient Name: Date of Service: [name] 06/12/2022 12:30 PM Medical Record Number: [id number] Patient Account Number: [id number] Date of Birth/Sex: Treating RN: 12/27/1940 (82 y.o. F) Primary Care Provider: Jenne Pane, [facility] Other Clinician: Referring Provider: Treating Provider/Extender: Devoria Albe, [facility] Weeks in Treatment: 0 Subjective Chief Complaint Information obtained from  Patient Patient seen for complaints of Non-Healing Wound. History of Present Illness (HPI) ADMISSION 06/12/2022 This is an 82 year old woman with a history of CVA, crest syndrome, atrial fibrillation, rocker-bottom foot deformity. 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 She has been followed by podiatry for this and I am not entirely clear as to how she came to be referred here. She resides in an assisted living facility. It is not clear what they have been putting on her wound, but on intake, she was noted to have denuded skin on her medial third toe, as well as ulcers on her dorsal great toe and lateral great toe. There is slough accumulation on both of the toe ulcers. There was an odor noted at intake, but after her foot was washed, the odor dissipated. Her toes are folded on top of each other creating areas of abrasion and pockets for moisture collection, which seems to be the primary cause of her ulceration. Patient History Information obtained from Patient. Allergies Sulfa (Sulfonamide Antibiotics) Family History Unknown History. Social History Former smoker - smoked when she was young, Marital Status - Widowed, Alcohol Use - Never, Drug Use - No History, Caffeine Use - Daily. Medical History Hematologic/Lymphatic Patient has history of Anemia Cardiovascular Patient has history of Angina - a-fib, Hypertension, Vasculitis Endocrine Denies history of Type I Diabetes, Type II Diabetes Musculoskeletal Patient has history of Osteoarthritis Hospitalization/Surgery History - cholecystectomy. - leg surgery. - tonsillectomy. 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 Eyes Complains or has  symptoms of Vision Changes - d/t stroke. Denies complaints or symptoms of Dry Eyes, Glasses / Contacts, xerophthalmia Ear/Nose/Mouth/Throat Denies complaints or symptoms of Chronic sinus problems or rhinitis. Respiratory Denies complaints or symptoms of Chronic or frequent coughs, Shortness of Breath. Gastrointestinal Denies complaints or symptoms of Frequent diarrhea, Nausea, Vomiting. Genitourinary Denies complaints or symptoms of Frequent urination. Integumentary (Skin) Complains or has symptoms of Wounds. Psychiatric [name], [name] ([id number]) [id number] Page 7 of 11 Denies complaints or symptoms of Claustrophobia. Objective Constitutional No acute distress. Vitals Time Taken: 12:50 PM, Height: 67 in, Source: Stated, Weight: 153 lbs, Source: Stated, BMI: 24, Temperature: 97.5 F, Pulse: 70 bpm, Respiratory Rate: 16 breaths/min, Blood Pressure: 100/57 mmHg. Respiratory Normal work of breathing on room air. General Notes: 06/12/2022: She has denuded skin on her medial third toe, as well as ulcers on her dorsal great toe and lateral great toe. There is slough accumulation on both of the toe ulcers. Her toes are folded on top of each other creating areas of abrasion and pockets for moisture collection. Integumentary (Hair, Skin) Wound #1 status is Open. Original cause of wound was Gradually Appeared. The date acquired was: 05/24/2021. The wound is located on the Left,Dorsal T oe Great. The wound measures 0.7cm length x 0.8cm width x 0.1cm depth; 0.44cm^2 area and 0.044cm^3 volume. There is Fat Layer (Subcutaneous Tissue) exposed. There is no tunneling or undermining noted. There is a  medium amount of serosanguineous drainage noted. The wound margin is distinct with the outline attached to the wound base. There is small (1-33%) red granulation within the wound bed. There is a large (67-100%) amount of necrotic tissue within the wound bed including Adherent  Slough. The periwound skin appearance had no abnormalities noted for texture. The periwound skin appearance exhibited: Dry/Scaly, Maceration, Rubor. Periwound temperature was noted as No Abnormality. Wound #2 status is Open. Original cause of wound was Gradually Appeared. The date acquired was: 05/24/2021. The wound is located on the Left,Lateral T oe Great. The wound measures 1.9cm length x 1.5cm width x 0.1cm depth; 2.238cm^2 area and 0.224cm^3 volume. There is Fat Layer (Subcutaneous Tissue) exposed. There is no tunneling or undermining noted. There is a medium amount of serosanguineous drainage noted. The wound margin is distinct with the outline attached to the wound base. There is small (1-33%) red granulation within the wound bed. There is a large (67-100%) amount of necrotic tissue within the wound bed. The periwound skin appearance had no abnormalities noted for texture. The periwound skin appearance exhibited: Maceration, Rubor. Periwound temperature was noted as No Abnormality. Wound #3 status is Open. Original cause of wound was Gradually Appeared. The date acquired was: 05/24/2021. The wound is located on the Left,Medial T oe Third. The wound measures 1cm length x 0.7cm width x 0.1cm depth; 0.55cm^2 area and 0.055cm^3 volume. There is Fat Layer (Subcutaneous Tissue) exposed. There is no tunneling or undermining noted. There is a medium amount of serosanguineous drainage noted. The wound margin is distinct with the outline attached to the wound base. There is small (1-33%) red granulation within the wound bed. There is a large (67-100%) amount of necrotic tissue within the wound bed including Adherent Slough. The periwound skin appearance had no abnormalities noted for texture. The periwound skin appearance exhibited: Maceration, Rubor. Periwound temperature was noted as No Abnormality. 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 There was a Selective/Open Wound Non-Viable Tissue o oe Debridement with a total area of 0.56 sq cm performed by Fredirick Maudlin, MD. With the following instrument(s): Curette to remove Non-Viable tissue/material. Material removed includes [facility] after achieving pain control using Lidocaine 4% Topical Solution. No specimens were taken. A time out was conducted at 13:24, prior to the start of the procedure. A Minimum amount of bleeding was controlled with Pressure. The procedure was tolerated well. Post Debridement Measurements: 0.7cm length x 0.8cm width x 0.1cm depth; 0.044cm^3 volume. Character of Wound/Ulcer Post Debridement is improved. Post procedure Diagnosis Wound #1: Same as Pre-Procedure General Notes: scribed for Dr. Celine Ahr by Adline Peals, RN. Wound #2 Pre-procedure diagnosis of Wound #2 is a T be determined located on the Left,Lateral T Great . There was a Selective/Open Wound Non-Viable Tissue o oe Debridement with a total area of 0.25 sq cm performed by Fredirick Maudlin, MD. With the following instrument(s): Curette to remove Non-Viable tissue/material. Material removed includes [facility] after achieving pain control using Lidocaine 4% Topical Solution. No specimens were taken. A time out was conducted at 13:24, prior to the start of the procedure.  A Minimum amount of bleeding was controlled with Pressure. The procedure was tolerated well. Post Debridement Measurements: 1.9cm length x 1.5cm width x 0.1cm depth; 0.224cm^3 volume. Character of Wound/Ulcer Post Debridement is  improved. [name], [name] ([id number]) [id number] Page 8 of 11 Post procedure Diagnosis Wound #2: Same as Pre-Procedure General Notes: scribed for Dr. Celine Ahr by Adline Peals, RN. Plan Follow-up Appointments: Return Appointment in 1 week. - Dr. Celine Ahr - room 2 Anesthetic: (In clinic) Topical Lidocaine 4% applied to wound bed Bathing/ Shower/ Hygiene: May shower and wash wound with soap and water. - keep toes and in between very dry The following medication(s) was prescribed: lidocaine topical 4 % cream cream topical was prescribed at facility WOUND #1: - T Great Wound Laterality: Dorsal, Left oe Cleanser: Soap and Water Every Other Day/30 Days Discharge Instructions: May shower and wash wound with dial antibacterial soap and water prior to dressing change. Cleanser: Wound Cleanser Every Other Day/30 Days Discharge Instructions: Cleanse the wound with wound cleanser prior to applying a clean dressing using gauze sponges, not tissue or cotton balls. Prim Dressing: Sorbalgon AG Dressing 2x2 (in/in) Every Other Day/30 Days ary Discharge Instructions: Apply to wound bed as instructed Secondary Dressing: Woven Gauze Sponge, Non-Sterile 4x4 in Every Other Day/30 Days Discharge Instructions: Apply over primary dressing as directed. Secured With: Scientist, forensic, Sterile 4x75 (in/in) Every Other Day/30 Days Discharge Instructions: Secure with stretch gauze as directed. Secured With: Transpore Surgical T ape, 2x10 (in/yd) Every Other Day/30 Days Discharge Instructions: Secure dressing with tape as directed. WOUND #2: - T Great Wound Laterality: Left, Lateral oe Cleanser: Soap and Water Every Other Day/30 Days Discharge Instructions: May shower and wash wound with dial antibacterial soap and water prior to dressing change. Cleanser: Wound Cleanser Every Other Day/30 Days Discharge Instructions: Cleanse the wound with wound cleanser prior to  applying a clean dressing using gauze sponges, not tissue or cotton balls. Prim Dressing: Sorbalgon AG Dressing 2x2 (in/in) Every Other Day/30 Days ary Discharge Instructions: Apply to wound bed as instructed Secondary Dressing: Woven Gauze Sponge, Non-Sterile 4x4 in Every Other Day/30 Days Discharge Instructions: Apply over primary dressing as directed. Secured With: Scientist, forensic, Sterile 4x75 (in/in) Every Other Day/30 Days Discharge Instructions: Secure with stretch gauze as directed. Secured With: Transpore Surgical T ape, 2x10 (in/yd) Every Other Day/30 Days Discharge Instructions: Secure dressing with tape as directed. WOUND #3: - T Third Wound Laterality: Left, Medial oe Cleanser: Soap and Water Every Other Day/30 Days Discharge Instructions: May shower and wash wound with dial antibacterial soap and water prior to dressing change. Cleanser: Wound Cleanser Every Other Day/30 Days Discharge Instructions: Cleanse the wound with wound cleanser prior to applying a clean dressing using gauze sponges, not tissue or cotton balls. Prim Dressing: Sorbalgon AG Dressing 2x2 (in/in) Every Other Day/30 Days ary Discharge Instructions: Apply to wound bed as instructed Secondary Dressing: Woven Gauze Sponge, Non-Sterile 4x4 in Every Other Day/30 Days Discharge Instructions: Apply over primary dressing as directed. Secured With: Scientist, forensic, Sterile 4x75 (in/in) Every Other Day/30 Days Discharge Instructions: Secure with stretch gauze as directed. Secured With: Transpore Surgical T ape, 2x10 (in/yd) Every Other Day/30 Days Discharge Instructions: Secure dressing with tape as directed. 06/12/2022: This is an 82 year old woman who has multiple foot deformities and presents with several wounds on her left foot. There is denuded skin on her medial third toe, as well as ulcers on her dorsal great toe and lateral  great toe. There is slough accumulation on  both of the toe ulcers. There was an odor noted at intake, but after her foot was washed, the odor dissipated. Her toes are folded on top of each other creating areas of abrasion and pockets for moisture collection, which seems to be the primary cause of her ulceration. I used a curette to debride the slough from her dorsal and lateral great toe ulcers. We will apply silver alginate to her wounds and make sure that there is gauze in between her toes to help alleviate friction and moisture accumulation. I put a note to her facility that it is critical that her toes are dried thoroughly after bathing and when performing wound care. She already has a surgical shoe so she will continue to wear that. Follow-up in 1 week. Electronic Signature(s) Signed: 06/12/2022 1:52:47 PM By: Fredirick Maudlin MD FACS Entered By: Fredirick Maudlin on 06/12/2022 13:52:47 -------------------------------------------------------------------------------- HxROS Details Patient Name: Date of Service: [name], [name] RO N 06/12/2022 12:30 PM Marcelline Deist, [name] ([id number] Page 9 of 11 Medical Record Number: [id number] Patient Account Number: [id number] Date of Birth/Sex: Treating RN: 1940/11/07 (82 y.o. Harlow Ohms Primary Care Provider: Jenne Pane, [facility] Other Clinician: Referring Provider: Treating Provider/Extender: Fredirick Maudlin SHO Carlynn Spry, [facility] Weeks in Treatment: 0 Information Obtained From Patient Constitutional Symptoms (General Health) Complaints and Symptoms: Negative for: Fatigue; Fever; Chills; Marked Weight Change Eyes Complaints and Symptoms: Positive for: Vision Changes - d/t stroke Negative for: Dry Eyes; Glasses / Contacts Review of System Notes: xerophthalmia Ear/Nose/Mouth/Throat Complaints and Symptoms: Negative for: Chronic sinus problems or rhinitis Respiratory Complaints and Symptoms: Negative for: Chronic or frequent coughs; Shortness of  Breath Gastrointestinal Complaints and Symptoms: Negative for: Frequent diarrhea; Nausea; Vomiting Genitourinary Complaints and Symptoms: Negative for: Frequent urination Integumentary (Skin) Complaints and Symptoms: Positive for: Wounds Psychiatric Complaints and Symptoms: Negative for: Claustrophobia Hematologic/Lymphatic Medical History: Positive for: Anemia Cardiovascular Medical History: Positive for: Angina - a-fib; Hypertension; 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 Endocrine Medical History: Negative for: Type I Diabetes; Type II Diabetes Past Medical History Notes: hypothyroidism Immunological Musculoskeletal Medical History: Positive for: 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 Joseph, Kathryn ([id number]) [phone].pdf Page 10 of 11 Neurologic Medical History: Past Medical History Notes: stroke Oncologic Immunizations Pneumococcal Vaccine: Received Pneumococcal Vaccination: Yes Received Pneumococcal Vaccination On or After 60th Birthday: Yes Implantable Devices None Hospitalization / Surgery History Type of Hospitalization/Surgery cholecystectomy leg surgery tonsillectomy Family and Social History Unknown History: Yes; Former smoker - smoked when she was young; Marital Status - Widowed; Alcohol Use: Never; Drug Use: No History; Caffeine Use: Daily; Financial Concerns: No; Food, Clothing or Shelter Needs: No; Support System Lacking: No; Transportation Concerns: No Engineer, maintenance) Signed: 06/12/2022 3:07:11 PM By: Fredirick Maudlin MD FACS Signed: 06/12/2022 4:08:42 PM By: Adline Peals Entered By: Adline Peals on 06/12/2022 12:53:59 -------------------------------------------------------------------------------- SuperBill Details Patient Name: Date of Service: [name] [facility] RO N 06/12/2022 Medical Record Number: [id number] Patient Account Number: [id number] Date of  Birth/Sex: Treating RN: [birth year] (82 y.o. 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 Z79.01 Long term (current) use of anticoagulants Facility Procedures : CPT4 Code: [id number] Description: 99213 - WOUND CARE VISIT-LEV 3 EST PT Modifier: 25 Quantity: 1 Physician Procedures : CPT4 Code Description Modifier [id number] - WC PHYS LEVEL 4 - NEW PT 25 ICD-10 Diagnosis Description L97.522 Non-pressure chronic ulcer of other part of left foot with fat layer exposed L97.521 Non-pressure chronic ulcer of other part of left foot  limited to breakdown of skin 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

## 2022-06-13 NOTE — Progress Notes (Signed)
Joseph, Kathryn ([id number]) [id number] Page 1 of 4 Visit Report for 06/12/2022 Abuse Risk Screen Details Patient Name: Date of Service: [name] 06/12/2022 12:30 PM Medical Record Number: [id number] Patient Account Number: [id number] Date of Birth/Sex: Treating RN: 02/10/1941 (82 y.o. [name] Primary Care Jaymes Revels: Jenne Pane, [facility] Other Clinician: Referring Camara Rosander: Treating Robynne Roat/Extender: Fredirick Maudlin SHO Carlynn Spry, [facility] Weeks in Treatment: 0 Abuse Risk Screen Items Answer ABUSE RISK SCREEN: Has anyone close to you tried to hurt or harm you recentlyo No Do you feel uncomfortable with anyone in your familyo No Has anyone forced you do things that you didnt want to doo No Electronic Signature(s) Signed: 06/12/2022 4:08:42 PM By: Adline Peals Entered By: Adline Peals on 06/12/2022 12:54:08 -------------------------------------------------------------------------------- Activities of Daily Living Details Patient Name: Date of ServiceJannette Joseph 06/12/2022 12:30 PM Medical Record Number: [id number] Patient Account Number: [id number] Date of Birth/Sex: Treating RN: 18-Jun-1940 (82 y.o. [name] Primary Care Stacee Earp: Jenne Pane, [facility] Other Clinician: Referring Aften Lipsey: Treating Aayliah Rotenberry/Extender: Fredirick Maudlin SHO Carlynn Spry, [facility] Weeks in Treatment: 0 Activities of Daily Living Items Answer Activities of Daily Living (Please select one for each item) Drive Automobile Not Able T Medications ake Completely Able Use T elephone Completely Able Care for Appearance Completely Able Use T oilet Completely Able Bath / Shower Completely Able Dress Self Completely Able Feed Self Completely Able Walk Need Assistance Get In / Out Bed Completely [place] Need Assistance Shop for Self Need Assistance Electronic Signature(s) Signed:  06/12/2022 4:08:42 PM By: Adline Peals Entered By: Adline Peals on 06/12/2022 12:54:47 [place], [place] ([id number]) [id number] Page 2 of 4 -------------------------------------------------------------------------------- Education Screening Details Patient Name: Date of Service: [name] 06/12/2022 12:30 PM Medical Record Number: [id number] Patient Account Number: [id number] Date of Birth/Sex: Treating RN: Dec 09, 1940 (82 y.o. [name] Primary Care Chyrel Taha: Jenne Pane, [facility] Other Clinician: Referring Vanya Carberry: Treating Kamden Reber/Extender: Devoria Albe, [facility] Weeks in Treatment: 0 Primary Learner Assessed: Patient Learning Preferences/Education Level/Primary Language Learning Preference: Explanation, Demonstration, Video, Printed Material Highest Education Level: College or Above Preferred Language: English Cognitive Barrier Language Barrier: No Translator Needed: No Memory Deficit: No Emotional Barrier: No Cultural/Religious Beliefs Affecting Medical Care: No Physical Barrier Impaired Vision: Yes Impaired Hearing: No Decreased Hand dexterity: No Knowledge/Comprehension Knowledge Level: Medium Comprehension Level: Medium Ability to understand written instructions: Medium Ability to understand verbal instructions: Medium Motivation Anxiety Level: Calm Cooperation: Cooperative Education Importance: Acknowledges Need Interest in Health Problems: Asks Questions Perception: Coherent Willingness to Engage in Self-Management Medium Activities: Readiness to Engage in Self-Management Medium Activities: Electronic Signature(s) Signed: 06/12/2022 4:08:42 PM By: Adline Peals Entered By: Adline Peals on 06/12/2022 12:55:41 -------------------------------------------------------------------------------- Fall Risk Assessment Details Patient Name: Date of Service: [place], [facility] RO N 06/12/2022  12:30 PM Medical Record Number: [id number] Patient Account Number: [id number] Date of Birth/Sex: Treating RN: [birth year] (82 y.o. [name] Primary Care Gennifer Potenza: Jenne Pane, [facility] Other Clinician: Referring Ordean Fouts: Treating Nailyn Dearinger/Extender: Devoria Albe, [facility] Weeks in Treatment: 0 Fall Risk Assessment Items Have you had 2 or more falls in the last 12 monthso 0 No [place], [place] ([id number]) [phone] [id number] Page 3 of 4 Have you had any fall that resulted in injury in the last 12 monthso 0 No FALLS RISK SCREEN History of falling - immediate or within 3 months 0 No Secondary diagnosis (Do you have 2 or more medical diagnoseso)  15 Yes Ambulatory aid None/bed rest/wheelchair/nurse 0 Yes Crutches/cane/walker 15 Yes Furniture 0 No Intravenous therapy Access/Saline/Heparin Lock 0 No Gait/Transferring Normal/ bed rest/ wheelchair 0 Yes Weak (short steps with or without shuffle, stooped but able to lift head while walking, may seek 0 No support from furniture) Impaired (short steps with shuffle, may have difficulty arising from chair, head down, impaired 0 No balance) Mental Status Oriented to own ability 0 No Electronic Signature(s) Signed: 06/12/2022 4:08:42 PM By: Adline Peals Entered By: Adline Peals on 06/12/2022 12:56:04 -------------------------------------------------------------------------------- Foot Assessment Details Patient Name: Date of Service: Danton Clap [facility] RO N 06/12/2022 12:30 PM Medical Record Number: [id number] Patient Account Number: [id number] Date of Birth/Sex: Treating RN: 06-12-1940 (82 y.o. [name] Primary Care Chaia Ikard: Jenne Pane, [facility] Other Clinician: Referring Shireen Rayburn: Treating Kearston Putman/Extender: Fredirick Maudlin SHO Carlynn Spry, [facility] Weeks in Treatment: 0 Foot Assessment Items Site Locations + = Sensation present, - = Sensation absent, C = Callus, U = Ulcer R = Redness, W =  Warmth, M = Maceration, PU = Pre-ulcerative lesion F = Fissure, S = Swelling, D = Dryness Assessment Right: Left: Other Deformity: No No Prior Foot Ulcer: No No Prior Amputation: No No Charcot Joint: No No Ambulatory Status: Ambulatory With Help Assistance Device: Britteney Grambo, Ivin Booty ([id number]) [id number] Page 4 of 4 Gait: Steady Electronic Signature(s) Signed: 06/12/2022 4:08:42 PM By: Adline Peals Entered By: Adline Peals on 06/12/2022 13:00:53 -------------------------------------------------------------------------------- Nutrition Risk Screening Details Patient Name: Date of Service: [name] 06/12/2022 12:30 PM Medical Record Number: [id number] Patient Account Number: [id number] Date of Birth/Sex: Treating RN: 11-Jan-1941 (82 y.o. [name] Primary Care Dak Szumski: Jenne Pane, [facility] Other Clinician: Referring Lashawne Dura: Treating Miro Balderson/Extender: Fredirick Maudlin SHO KES, [facility] Weeks in Treatment: 0 Height (in): 67 Weight (lbs): 153 Body Mass Index (BMI): 24 Nutrition Risk Screening Items Score Screening NUTRITION RISK SCREEN: I have an illness or condition that made me change the kind and/or amount of food I eat 0 No I eat fewer than two meals per day 0 No I eat few fruits and vegetables, or milk products 0 No I have three or more drinks of beer, liquor or wine almost every day 0 No I have tooth or mouth problems that make it hard for me to eat 0 No I don't always have enough money to buy the food I need 0 No I eat alone most of the time 0 No I take three or more different prescribed or over-the-counter drugs a day 1 Yes Without wanting to, I have lost or gained 10 pounds in the last six months 0 No I am not always physically able to shop, cook and/or feed myself 2 Yes Nutrition Protocols Good Risk Protocol Moderate Risk Protocol 0 Provide education on nutrition High Risk Proctocol Risk Level:  Moderate Risk Score: 3 Electronic Signature(s) Signed: 06/12/2022 4:08:42 PM By: Adline Peals Entered By: Adline Peals on 06/12/2022 12:56:26

## 2022-06-13 NOTE — Progress Notes (Signed)
[name], [name] ([id number]) [id number] Page 1 of 10 Visit Report for 06/12/2022 Allergy List Details Patient Name: Date of Service: [name] RO N 06/12/2022 12:30 PM Medical Record Number: [id number] Patient Account Number: [id number] Date of Birth/Sex: Treating RN: Jul 06, 1940 (82 y.o. [name] Primary Care Enzio Buchler: Jenne Pane, [facility] Other Clinician: Referring Dafna Romo: Treating Jadzia Ibsen/Extender: Fredirick Maudlin SHO KES, [facility] Weeks in Treatment: 0 Allergies Active Allergies Sulfa (Sulfonamide Antibiotics) Allergy Notes Electronic Signature(s) Signed: 06/12/2022 4:08:42 PM By: Adline Peals Entered By: Adline Peals on 06/12/2022 12:48:12 -------------------------------------------------------------------------------- Arrival Information Details Patient Name: Date of Service: [name] [facility] RO N 06/12/2022 12:30 PM Medical Record Number: [id number] Patient Account Number: [id number] Date of Birth/Sex: Treating RN: [birth year] (82 y.o. [name] Primary Care Emrys Mceachron: Jenne Pane, [facility] Other Clinician: Referring Eleonora Peeler: Treating Marcos Ruelas/Extender: Devoria Albe, [facility] Weeks in Treatment: 0 Visit Information Patient Arrived: Wheel Chair Arrival Time: 12:46 Accompanied By: friend Transfer Assistance: Manual Patient Identification Verified: Yes Secondary Verification Process Completed: Yes Electronic Signature(s) Signed: 06/12/2022 4:08:42 PM By: Adline Peals Entered By: Adline Peals on 06/12/2022 12:47:55 -------------------------------------------------------------------------------- Clinic Level of Care Assessment Details Patient Name: Date of Service: [name] 06/12/2022 12:30 PM Medical Record Number: [id number] Patient Account Number: [id number] Date of Birth/Sex: Treating RN: November 21, 1940 (82 y.o. [name] Primary Care Franciszek Platten: Jenne Pane, [facility] Other  Clinician: LILLIS, BEIER ([id number]) [id number] Page 2 of 10 Referring Cailan General: Treating Selisa Tensley/Extender: Fredirick Maudlin SHO KES, [facility] Weeks in Treatment: 0 Clinic Level of Care Assessment Items TOOL 1 Quantity Score X- 1 0 Use when EandM and Procedure is performed on INITIAL visit ASSESSMENTS - Nursing Assessment / Reassessment X- 1 20 General Physical Exam (combine w/ comprehensive assessment (listed just below) when performed on new pt. evals) X- 1 25 Comprehensive Assessment (HX, ROS, Risk Assessments, Wounds Hx, etc.) ASSESSMENTS - Wound and Skin Assessment / Reassessment []$  - 0 Dermatologic / Skin Assessment (not related to wound area) ASSESSMENTS - Ostomy and/or Continence Assessment and Care []$  - 0 Incontinence Assessment and Management []$  - 0 Ostomy Care Assessment and Management (repouching, etc.) PROCESS - Coordination of Care X - Simple Patient / Family Education for ongoing care 1 15 []$  - 0 Complex (extensive) Patient / Family Education for ongoing care X- 1 10 Staff obtains Programmer, systems, Records, T Results / Process Orders est []$  - 0 Staff telephones HHA, Nursing Homes / Clarify orders / etc []$  - 0 Routine Transfer to another Facility (non-emergent condition) []$  - 0 Routine Hospital Admission (non-emergent condition) X- 1 15 New Admissions / Biomedical engineer / Ordering NPWT Apligraf, etc. , []$  - 0 Emergency Hospital Admission (emergent condition) PROCESS - Special Needs []$  - 0 Pediatric / Minor Patient Management []$  - 0 Isolation Patient Management []$  - 0 Hearing / Language / Visual special needs []$  - 0 Assessment of Community assistance (transportation, D/C planning, etc.) []$  - 0 Additional assistance / Altered mentation []$  - 0 Support Surface(s) Assessment (bed, cushion, seat, etc.) INTERVENTIONS - Miscellaneous []$  - 0 External ear exam []$  - 0 Patient Transfer (multiple staff / Civil Service fast streamer / Similar  devices) []$  - 0 Simple Staple / Suture removal (25 or less) []$  - 0 Complex Staple / Suture removal (26 or more) []$  - 0 Hypo/Hyperglycemic Management (do not check if billed separately) X- 1 15 Ankle / Brachial Index (ABI) - do not check if billed separately Has the patient been seen at the hospital within the last three years: Yes  Total Score: 100 Level Of Care: New/Established - Level 3 Electronic Signature(s) Signed: 06/12/2022 4:08:42 PM By: Adline Peals Entered By: Adline Peals on 06/12/2022 13:44:14 Encounter Discharge Information Details -------------------------------------------------------------------------------- [name] ([id number]) [id number] Page 3 of 10 Patient Name: Date of Service: [name] 06/12/2022 12:30 PM Medical Record Number: [id number] Patient Account Number: [id number] Date of Birth/Sex: Treating RN: 1940/06/25 (82 y.o. [name] Primary Care Jakyiah Briones: Jenne Pane, [facility] Other Clinician: Referring Zaden Sako: Treating Toa Mia/Extender: Fredirick Maudlin SHO Carlynn Spry, [facility] Weeks in Treatment: 0 Encounter Discharge Information Items Post Procedure Vitals Discharge Condition: Stable Temperature (F): 97.5 Ambulatory Status: Wheelchair Pulse (bpm): 70 Discharge Destination: [place] Respiratory Rate (breaths/min): 16 Telephoned: No Blood Pressure (mmHg): 100/57 Orders Sent: Yes Transportation: Private Auto Accompanied By: friend Schedule Follow-up Appointment: Yes Clinical Summary of Care: Patient Declined Electronic Signature(s) Signed: 06/12/2022 4:08:42 PM By: Adline Peals Entered By: Adline Peals on 06/12/2022 13:45:03 -------------------------------------------------------------------------------- Lower Extremity Assessment Details Patient Name: Date of Service: [name] RO N 06/12/2022 12:30 PM Medical Record Number: [id number] Patient Account Number:  [id number] Date of Birth/Sex: Treating RN: April 30, 1940 (82 y.o. [name] Primary Care Brynnlie Unterreiner: Jenne Pane, [facility] Other Clinician: Referring Shaheem Pichon: Treating Emilene Roma/Extender: Fredirick Maudlin SHO KES, [facility] Weeks in Treatment: 0 Edema Assessment Assessed: [Left: No] [Right: No] [Left: Edema] [Right: :] Calf Left: Right: Point of Measurement: From Medial Instep 37 cm Ankle Left: Right: Point of Measurement: From Medial Instep 24.5 cm Vascular Assessment Pulses: Dorsalis Pedis Palpable: [Left:No] Blood Pressure: Brachial: [Left:100] Ankle: [Left:Dorsalis Pedis: 88 0.88] Electronic Signature(s) Signed: 06/12/2022 4:08:42 PM By: Adline Peals Entered By: Adline Peals on 06/12/2022 13:11:39 Tieken, Liley ([id number].pdf Page 4 of 10 -------------------------------------------------------------------------------- Multi Wound Chart Details Patient Name: Date of Service: [name] 06/12/2022 12:30 PM Medical Record Number: [id number] Patient Account Number: [id number] Date of Birth/Sex: Treating RN: 09/06/1940 (82 y.o. F) Primary Care Maniya Donovan: Jenne Pane, [facility] Other Clinician: Referring Candiss Galeana: Treating Giulian Goldring/Extender: Fredirick Maudlin SHO Carlynn Spry, [facility] Weeks in Treatment: 0 Vital Signs Height(in): 60 Pulse(bpm): 77 Weight(lbs): 153 Blood Pressure(mmHg): 100/57 Body Mass Index(BMI): 24 Temperature(F): 97.5 Respiratory Rate(breaths/min): 16 [1:Photos: No Photos Left, Dorsal T Great oe Wound Location: Gradually Appeared Wounding Event: T be determined o Primary Etiology: Anemia, Angina, Hypertension, Comorbid History: Vasculitis, Osteoarthritis 05/24/2021 Date Acquired: 0 Weeks of Treatment: Open Wound  Status: No Wound Recurrence: 0.7x0.8x0.1 Measurements L x W x D (cm) 0.44 A (cm) : rea 0.044 Volume (cm) : 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 Classification: Support Structures Medium Exudate A mount:  Serosanguineous Exudate Type: red, brown Exudate Color:  Distinct, outline attached Wound Margin: Small (1-33%) Granulation A mount: Red Granulation Quality: Large (67-100%) Necrotic A mount: Fat Layer (Subcutaneous Tissue): Yes Fat Layer (Subcutaneous Tissue): Yes Fat Layer (Subcutaneous Tissue): Yes Exposed  Structures: Fascia: No Tendon: No Muscle: No Joint: No Bone: No None Epithelialization: Debridement - Selective/Open Wound Debridement - Selective/Open Wound N/A Debridement: Pre-procedure Verification/Time Out 13:24 Taken: Lidocaine 4% Topical Solution  Pain Control: Slough Tissue Debrided: Non-Viable Tissue Level: 0.56 Debridement A (sq cm): rea Curette Instrument: Minimum Bleeding: Pressure Hemostasis A chieved: Procedure was tolerated well Debridement Treatment Response: 0.7x0.8x0.1 Post Debridement  Measurements L x W x D (cm) 0.044 Post Debridement Volume: (cm) No Abnormalities Noted Periwound Skin Texture: Maceration: Yes Periwound Skin Moisture: Dry/Scaly: Yes Rubor: Yes Periwound Skin Color: No Abnormality Temperature: Debridement Procedures  Performed:] [2:No Photos Left, Medial T Great oe Gradually Appeared T be determined Anemia, Angina, Hypertension,  Vasculitis, Osteoarthritis 05/24/2021 0 Open No 1.9x1.5x0.1 2.238 0.224 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 Support Structures Medium  Serosanguineous red, brown Distinct, outline attached Small (1-33%) Red Large (67-100%) Fascia: No Tendon: No Muscle: No Joint: No Bone: No None 13:24 Lidocaine 4% Topical Solution Slough Non-Viable Tissue 0.25 Curette Minimum Pressure Procedure was  tolerated well 1.9x1.5x0.1 0.224 No Abnormalities Noted Maceration: Yes Rubor: Yes No Abnormality Debridement] [3:No Photos Left, Medial T Third oe Gradually Appeared o Anemia, Angina, Hypertension, Vasculitis, Osteoarthritis 05/24/2021 0 Open No 1x0.7x0.1  0.55 0.055 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 Support Structures Medium Serosanguineous red, brown Distinct, outline  attached Small (1-33%) Red Large (67-100%) Fascia: No Tendon: No Muscle: No Joint: No Bone: No None N/A N/A N/A N/A N/A N/A N/A N/A N/A N/A  N/A No Abnormalities Noted Maceration: Yes Rubor: Yes No Abnormality N/A] Treatment Notes Electronic Signature(s) Signed: 06/12/2022 1:43:43 PM By: Fredirick Maudlin MD [place], [place] ([id number]) [id number] Page 5 of 10 Entered By: Fredirick Maudlin on 06/12/2022 13:43:42 -------------------------------------------------------------------------------- Multi-Disciplinary Care Plan Details Patient Name: Date of Service: [name] 06/12/2022 12:30 PM Medical Record Number: [id number] Patient Account Number: [id number] Date of Birth/Sex: Treating RN: 09-11-1940 (82 y.o. [name] Primary Care Aleksandra Raben: Jenne Pane, [facility] Other Clinician: Referring Chael Urenda: Treating Ky Rumple/Extender: Fredirick Maudlin SHO Carlynn Spry, [facility] Weeks in Treatment: 0 Active Inactive Necrotic Tissue Nursing Diagnoses: Impaired tissue integrity related to necrotic/devitalized tissue Knowledge deficit related to management of necrotic/devitalized tissue Goals: Necrotic/devitalized tissue will be minimized in the wound bed Date Initiated: 06/12/2022 Target Resolution Date: 07/27/2022 Goal Status: Active Patient/caregiver will verbalize understanding of reason and process for debridement of necrotic tissue Date Initiated: 06/12/2022 Target Resolution Date: 07/27/2022 Goal Status: Active Interventions: Assess patient pain level pre-, during and post procedure and prior to discharge Provide education on necrotic tissue and debridement process Treatment Activities: Apply topical anesthetic as ordered : 06/12/2022 Notes: Wound/Skin Impairment Nursing Diagnoses: Impaired tissue integrity Knowledge deficit related to ulceration/compromised skin integrity Goals: Patient/caregiver will verbalize understanding of skin care regimen Date  Initiated: 06/12/2022 Target Resolution Date: 07/27/2022 Goal Status: Active Interventions: Assess ulceration(s) every visit Treatment Activities: Skin care regimen initiated : 06/12/2022 Topical wound management initiated : 06/12/2022 Notes: Electronic Signature(s) Signed: 06/12/2022 4:08:42 PM By: Adline Peals Entered By: Adline Peals on 06/12/2022 13:43:58 [name], [name] ([id number].pdf Page 6 of 10 -------------------------------------------------------------------------------- Pain Assessment Details Patient Name: Date of Service: [name] 06/12/2022 12:30 PM Medical Record Number: [id number] Patient Account Number: [id number] Date of Birth/Sex: Treating RN: Jun 20, 1940 (82 y.o. [name] Primary Care Chele Cornell: Jenne Pane, [facility] Other Clinician: Referring Jasper Hanf: Treating Justen Fonda/Extender: Fredirick Maudlin SHO Carlynn Spry, [facility] Weeks in Treatment: 0 Active Problems Location of Pain Severity and Description of Pain Patient Has Paino No Site Locations Rate the pain. Current Pain Level: 0 Pain Management and Medication Current Pain Management: Electronic Signature(s) Signed: 06/12/2022 4:08:42 PM By: Adline Peals Entered By: Adline Peals on 06/12/2022 13:15:38 -------------------------------------------------------------------------------- Patient/Caregiver Education Details Patient Name: Date of Service: [name] RO N 2/20/2024andnbsp12:30 PM Medical Record Number: [id number] Patient Account Number: [id number] Date of Birth/Gender: Treating RN: 01/03/1941 (81 y.o. [name] Primary Care Physician: Jenne Pane, [facility] Other Clinician: Referring Physician: Treating Physician/Extender: Devoria Albe, [facility] Weeks in Treatment: 0 Education Assessment Education Provided To: Patient Education Topics Provided Wound Debridement: Methods: Explain/Verbal Responses: Reinforcements  needed, State content correctly [place], [place] ([id number]) [id number].pdf Page 7 of 10 Electronic Signature(s) Signed: 06/12/2022 4:08:42 PM By: Sabas Sous  By: Adline Peals on 06/12/2022 13:20:52 -------------------------------------------------------------------------------- Wound Assessment Details Patient Name: Date of Service: [name] 06/12/2022 12:30 PM Medical Record Number: [id number] Patient Account Number: [id number] Date of Birth/Sex: Treating RN: 08-04-1940 (82 y.o. [name] Primary Care Osiris Charles: Jenne Pane, [facility] Other Clinician: Referring Nachelle Negrette: Treating Kelleigh Skerritt/Extender: Fredirick Maudlin SHO Carlynn Spry, [facility] Weeks in Treatment: 0 Wound Status Wound Number: 1 Primary Etiology: T be determined o Wound Location: Left, Dorsal T Great oe Wound Status: Open Wounding Event: Gradually Appeared Comorbid History: Anemia, Angina, Hypertension, Vasculitis, Osteoarthritis Date Acquired: 05/24/2021 Weeks Of Treatment: 0 Clustered Wound: No Wound Measurements Length: (cm) 0.7 Width: (cm) 0.8 Depth: (cm) 0.1 Area: (cm) 0.44 Volume: (cm) 0.044 % Reduction in Area: % Reduction in Volume: Epithelialization: None Tunneling: No Undermining: No Wound Description Classification: 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 Support Structures Wound Margin: Distinct, outline attached Exudate Amount: Medium Exudate Type: Serosanguineous Exudate Color: red, brown Foul Odor After Cleansing: No Slough/Fibrino Yes Wound Bed Granulation Amount: Small (1-33%) Exposed Structure Granulation Quality: Red Fascia Exposed: No Necrotic Amount: Large (67-100%) Fat Layer (Subcutaneous Tissue) Exposed: Yes Necrotic Quality: Adherent Slough Tendon Exposed: No Muscle Exposed: No Joint Exposed: No Bone Exposed: No Periwound Skin Texture Texture Color No Abnormalities Noted: Yes No Abnormalities Noted: No Rubor: Yes Moisture No  Abnormalities Noted: No Temperature / Pain Dry / Scaly: Yes Temperature: No Abnormality Maceration: Yes Electronic Signature(s) Signed: 06/12/2022 4:08:42 PM By: Adline Peals Entered By: Adline Peals on 06/12/2022 13:08:36 [name], [name] ([id number].pdf Page 8 of 10 -------------------------------------------------------------------------------- Wound Assessment Details Patient Name: Date of Service: [name] 06/12/2022 12:30 PM Medical Record Number: [id number] Patient Account Number: [id number] Date of Birth/Sex: Treating RN: August 01, 1940 (82 y.o. F) Primary Care Honorio Devol: Jenne Pane, [facility] Other Clinician: Referring Justun Anaya: Treating Ebrima Ranta/Extender: Fredirick Maudlin SHO Carlynn Spry, [facility] Weeks in Treatment: 0 Wound Status Wound Number: 2 Primary Etiology: T be determined o Wound Location: Left, Lateral T Great oe Wound Status: Open Wounding Event: Gradually Appeared Comorbid History: Anemia, Angina, Hypertension, Vasculitis, Osteoarthritis Date Acquired: 05/24/2021 Weeks Of Treatment: 0 Clustered Wound: No Wound Measurements Length: (cm) 1.9 Width: (cm) 1.5 Depth: (cm) 0.1 Area: (cm) 2.238 Volume: (cm) 0.224 % Reduction in Area: % Reduction in Volume: Epithelialization: None Tunneling: No Undermining: No Wound Description Classification: 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 Support Wound Margin: Distinct, outline attached Exudate Amount: Medium Exudate Type: Serosanguineous Exudate Color: red, brown Structures Foul Odor After Cleansing: No Slough/Fibrino Yes Wound Bed Granulation Amount: Small (1-33%) Exposed Structure Granulation Quality: Red Fascia Exposed: No Necrotic Amount: Large (67-100%) Fat Layer (Subcutaneous Tissue) Exposed: Yes Tendon Exposed: No Muscle Exposed: No Joint Exposed: No Bone Exposed: No Periwound Skin Texture Texture Color No Abnormalities Noted: Yes No Abnormalities Noted: No Rubor:  Yes Moisture No Abnormalities Noted: No Temperature / Pain Maceration: Yes Temperature: No Abnormality Electronic Signature(s) Signed: 06/12/2022 1:48:21 PM By: Fredirick Maudlin MD FACS Previous Signature: 06/12/2022 1:43:22 PM Version By: Fredirick Maudlin MD FACS Entered By: Fredirick Maudlin on 06/12/2022 13:48:21 -------------------------------------------------------------------------------- Wound Assessment Details Patient Name: Date of Service: [name] [facility] RO N 06/12/2022 12:30 PM Medical Record Number: [id number] Patient Account Number: [id number] Date of Birth/Sex: Treating RN: 09-13-1940 (82 y.o. [name] Primary Care Donnie Panik: Jenne Pane, [facility] Other Clinician: Referring Keltie Labell: Treating Riot Barrick/Extender: Devoria Albe, [place], Ivin Booty ([id number]) [id number] Page 9 of 10 Weeks in Treatment: 0 Wound Status Wound Number: 3 Primary Etiology: T be determined o Wound Location: Left, Medial T Third oe  Wound Status: Open Wounding Event: Gradually Appeared Comorbid History: Anemia, Angina, Hypertension, Vasculitis, Osteoarthritis Date Acquired: 05/24/2021 Weeks Of Treatment: 0 Clustered Wound: No Wound Measurements Length: (cm) 1 Width: (cm) 0.7 Depth: (cm) 0.1 Area: (cm) 0.55 Volume: (cm) 0.055 % Reduction in Area: % Reduction in Volume: Epithelialization: None Tunneling: No Undermining: No Wound Description Classification: 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 Support Structures Wound Margin: Distinct, outline attached Exudate Amount: Medium Exudate Type: Serosanguineous Exudate Color: red, brown Foul Odor After Cleansing: No Slough/Fibrino Yes Wound Bed Granulation Amount: Small (1-33%) Exposed Structure Granulation Quality: Red Fascia Exposed: No Necrotic Amount: Large (67-100%) Fat Layer (Subcutaneous Tissue) Exposed: Yes Necrotic Quality: Adherent Slough Tendon Exposed: No Muscle Exposed: No Joint  Exposed: No Bone Exposed: No Periwound Skin Texture Texture Color No Abnormalities Noted: Yes No Abnormalities Noted: No Rubor: Yes Moisture No Abnormalities Noted: No Temperature / Pain Maceration: Yes Temperature: No Abnormality Electronic Signature(s) Signed: 06/12/2022 4:08:42 PM By: Adline Peals Entered By: Adline Peals on 06/12/2022 13:13:54 -------------------------------------------------------------------------------- Vitals Details Patient Name: Date of Service: [name], [facility] RO N 06/12/2022 12:30 PM Medical Record Number: [id number] Patient Account Number: [id number] Date of Birth/Sex: Treating RN: 1940/09/06 (82 y.o. [name] Primary Care Juaquina Machnik: Jenne Pane, [facility] Other Clinician: Referring Katriel Cutsforth: Treating Hillel Card/Extender: Fredirick Maudlin SHO Carlynn Spry, [facility] Weeks in Treatment: 0 Vital Signs Time Taken: 12:50 Temperature (F): 97.5 Height (in): 67 Pulse (bpm): 70 Source: Stated Respiratory Rate (breaths/min): 16 Weight (lbs): 153 Blood Pressure (mmHg): 100/57 Source: Stated Reference Range: 80 - 120 mg / dl Body Mass Index (BMI): 24 Electronic Signature(s) Signed: 06/12/2022 4:08:42 PM By: Marye Round, [name] ([id number]) By: Janice Norrie.pdf Page 10 of 10 Signed: 06/12/2022 4:08:42 PM aylor Entered By: Adline Peals on 06/12/2022 12:50:49

## 2022-06-20 ENCOUNTER — Encounter (HOSPITAL_BASED_OUTPATIENT_CLINIC_OR_DEPARTMENT_OTHER): Payer: Medicare Other | Admitting: General Surgery

## 2022-06-20 DIAGNOSIS — L97522 Non-pressure chronic ulcer of other part of left foot with fat layer exposed: Secondary | ICD-10-CM | POA: Diagnosis not present

## 2022-06-21 NOTE — Progress Notes (Signed)
[name], [name] ([id number]) [id number] Page 1 of 10 Visit Report for 06/20/2022 Chief Complaint Document Details Patient Name: Date of Service: [name] RO N 06/20/2022 1:30 PM Medical Record Number: [id number] Patient Account Number: [id number] Date of Birth/Sex: Treating RN: 04-May-1940 (82 y.o. F) Primary Care Provider: Jenne Pane, [facility] Other Clinician: Referring Provider: Treating Provider/Extender: Devoria Albe, [facility] Weeks in Treatment: 1 Information Obtained from: Patient Chief Complaint Patient seen for complaints of Non-Healing Wound. Electronic Signature(s) Signed: 06/20/2022 2:30:06 PM By: Fredirick Maudlin MD FACS Entered By: Fredirick Maudlin on 06/20/2022 14:30:06 -------------------------------------------------------------------------------- Debridement Details Patient Name: Date of Service: [name], [name] RO N 06/20/2022 1:30 PM Medical Record Number: [id number] Patient Account Number: [id number] Date of Birth/Sex: Treating RN: Jun 24, 1940 (82 y.o. [name] Primary Care Provider: Jenne Pane, [facility] Other Clinician: Referring Provider: Treating Provider/Extender: Devoria Albe, [facility] Weeks in Treatment: 1 Debridement Performed for Assessment: Wound #1 Left,Dorsal T Great oe Performed By: Physician Fredirick Maudlin, MD Debridement Type: Debridement Level of Consciousness (Pre-procedure): Awake and Alert Pre-procedure Verification/Time Out Yes - 14:21 Taken: Start Time: 14:21 Pain Control: Lidocaine 4% T opical Solution T Area Debrided (L x W): otal 0.5 (cm) x 0.6 (cm) = 0.3 (cm) Tissue and other material debrided: Non-Viable, Slough, Slough Level: Non-Viable Tissue Debridement Description: Selective/Open Wound Instrument: Curette Bleeding: Minimum Hemostasis Achieved: Pressure Response to Treatment: Procedure was tolerated well Level of Consciousness (Post- Awake and Alert procedure): Post  Debridement Measurements of Total Wound Length: (cm) 0.5 Width: (cm) 0.6 Depth: (cm) 0.1 Volume: (cm) 0.024 Character of Wound/Ulcer Post Debridement: Improved Post Procedure Diagnosis Same as Pre-procedure Notes Scribed for Dr. Celine Ahr by Adline Peals, RN Electronic Signature(s) Signed: 06/20/2022 3:42:50 PM By: Adline Peals Signed: 06/20/2022 4:13:19 PM By: Fredirick Maudlin MD FACS Entered By: Adline Peals on 06/20/2022 14:22:18 Bucklin, Skyleen ([id number]) [id number] Page 2 of 10 -------------------------------------------------------------------------------- Debridement Details Patient Name: Date of Service: [name] 06/20/2022 1:30 PM Medical Record Number: [id number] Patient Account Number: [id number] Date of Birth/Sex: Treating RN: 09-15-1940 (83 y.o. [name] Primary Care Provider: Jenne Pane, [facility] Other Clinician: Referring Provider: Treating Provider/Extender: Devoria Albe, [facility] Weeks in Treatment: 1 Debridement Performed for Assessment: Wound #3 Left,Medial T Third oe Performed By: Physician Fredirick Maudlin, MD Debridement Type: Debridement Level of Consciousness (Pre-procedure): Awake and Alert Pre-procedure Verification/Time Out Yes - 14:21 Taken: Start Time: 14:21 Pain Control: Lidocaine 4% T opical Solution T Area Debrided (L x W): otal 1 (cm) x 1 (cm) = 1 (cm) Tissue and other material debrided: Non-Viable, Slough, Slough Level: Non-Viable Tissue Debridement Description: Selective/Open Wound Instrument: Curette Bleeding: Minimum Hemostasis Achieved: Pressure Response to Treatment: Procedure was tolerated well Level of Consciousness (Post- Awake and Alert procedure): Post Debridement Measurements of Total Wound Length: (cm) 1 Width: (cm) 1 Depth: (cm) 0.1 Volume: (cm) 0.079 Character of Wound/Ulcer Post Debridement: Improved Post Procedure Diagnosis Same as  Pre-procedure Notes Scribed for Dr. Celine Ahr by Adline Peals, RN Electronic Signature(s) Signed: 06/20/2022 3:42:50 PM By: Adline Peals Signed: 06/20/2022 4:13:19 PM By: Fredirick Maudlin MD FACS Entered By: Adline Peals on 06/20/2022 14:23:32 -------------------------------------------------------------------------------- HPI Details Patient Name: Date of Service: [name], [facility] RO N 06/20/2022 1:30 PM Medical Record Number: [id number] Patient Account Number: [id number] Date of Birth/Sex: Treating RN: 04-06-1941 (82 y.o. F) Primary Care Provider: Jenne Pane, [facility] Other Clinician: Referring Provider: Treating Provider/Extender: Devoria Albe, [facility] Weeks in Treatment: 1 History of Present Illness HPI Description: ADMISSION 06/12/2022  This is an 82 year old woman with a history of CVA, crest syndrome, atrial fibrillation, rocker-bottom foot deformity. 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 She has been followed by podiatry for this and I am not entirely clear as to how she came to be referred here. She resides in an assisted living facility. It is not clear what they have been putting on her wound, but on intake, she was noted to have denuded skin on her medial third toe, as well as ulcers on her dorsal great toe and lateral great toe. There is slough accumulation on both of the toe ulcers. There was an odor noted at intake, but after her foot was washed, the odor dissipated. Her toes are folded on top of each other creating areas of abrasion and pockets for moisture collection, which seems to be the primary cause of her ulceration. 06/20/2022: The skin between her toes and on the ball of her foot is completely macerated. There has been more tissue breakdown. The wound on her great toe has some slough accumulation. Electronic Signature(s) Signed: 06/20/2022 2:31:27 PM By: Fredirick Maudlin MD [place], Ivin Booty  ([id number]) By: Fredirick Maudlin MD FACS [phone] Page 3 of 10 Signed: 06/20/2022 2:31:27 PM Entered By: Fredirick Maudlin on 06/20/2022 14:31:26 -------------------------------------------------------------------------------- Physical Exam Details Patient Name: Date of Service: [name] 06/20/2022 1:30 PM Medical Record Number: [id number] Patient Account Number: [id number] Date of Birth/Sex: Treating RN: May 10, 1940 (82 y.o. F) Primary Care Provider: Jenne Pane, [facility] Other Clinician: Referring Provider: Treating Provider/Extender: Fredirick Maudlin SHO KES, [facility] Weeks in Treatment: 1 Constitutional . . . . no acute distress. Respiratory Normal work of breathing on room air. Notes 06/20/2022: The skin between her toes and on the ball of her foot is completely macerated. There has been more tissue breakdown. The wound on her great toe has some slough accumulation. Electronic Signature(s) Signed: 06/20/2022 2:32:18 PM By: Fredirick Maudlin MD FACS Entered By: Fredirick Maudlin on 06/20/2022 14:32:18 -------------------------------------------------------------------------------- Physician Orders Details Patient Name: Date of Service: [name], [facility] RO N 06/20/2022 1:30 PM Medical Record Number: [id number] Patient Account Number: [id number] Date of Birth/Sex: Treating RN: 24-Apr-1940 (82 y.o. 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 - Dr. Celine Ahr - room 2 Return A Anesthetic (In clinic) Topical Lidocaine 4% applied to wound bed Bathing/ Shower/ Hygiene May shower and wash wound with soap and water. - keep toes and in between very dry Wound Treatment Wound #1 - T Great oe Wound Laterality: Dorsal, Left Cleanser: Soap and Water 1 x Per Day/30 Days Discharge Instructions: May shower and wash wound with dial antibacterial soap and water prior to dressing change. Cleanser: Wound Cleanser 1 x Per Day/30 Days Discharge Instructions: Cleanse the wound with wound cleanser prior to applying a clean dressing using gauze sponges, not tissue or cotton balls. Prim Dressing: Sorbalgon AG Dressing 2x2 (in/in) 1 x Per Day/30 Days ary Discharge  Instructions: Apply to wound bed as instructed KRZYWICKI, [place] ([id number]) [id number].pdf Page 4 of 10 Secondary Dressing: Woven Gauze Sponge, Non-Sterile 4x4 in 1 x Per Day/30 Days Discharge Instructions: Apply over primary dressing as directed. Secured With: Scientist, forensic, Sterile 4x75 (in/in) 1 x Per Day/30 Days Discharge Instructions: Secure with stretch gauze as directed. Secured With: Transpore Surgical Tape, 2x10 (in/yd) 1 x Per Day/30 Days Discharge Instructions: Secure dressing with tape as directed. Wound #2 - T Great oe Wound Laterality: Left, Lateral Cleanser: Soap and Water 1 x Per Day/30 Days Discharge Instructions: May shower and wash wound with dial antibacterial soap and water prior to dressing change. Cleanser: Wound Cleanser 1 x Per Day/30 Days Discharge Instructions: Cleanse the wound with wound cleanser prior to applying a clean dressing using gauze sponges, not tissue or cotton balls. Prim Dressing: Sorbalgon AG Dressing 2x2 (in/in) 1 x Per Day/30 Days ary Discharge  Instructions: Apply to wound bed as instructed Secondary Dressing: Woven Gauze Sponge, Non-Sterile 4x4 in 1 x Per Day/30 Days Discharge Instructions: Apply over primary dressing as directed. Secured With: Scientist, forensic, Sterile 4x75 (in/in) 1 x Per Day/30 Days Discharge Instructions: Secure with stretch gauze as directed. Secured With: Transpore Surgical Tape, 2x10 (in/yd) 1 x Per Day/30 Days Discharge Instructions: Secure dressing with tape as directed. Wound #3 - T Third oe Wound Laterality: Left, Medial Cleanser: Soap and Water 1 x Per Day/30 Days Discharge Instructions: May shower and wash wound with dial antibacterial soap and water prior to dressing change. Cleanser: Wound Cleanser 1 x Per Day/30 Days Discharge Instructions: Cleanse the wound with wound cleanser prior to applying a clean dressing using gauze sponges, not tissue or cotton balls. Prim Dressing: Sorbalgon AG Dressing 2x2 (in/in) 1 x Per Day/30 Days ary Discharge Instructions: Apply to wound bed as instructed Secondary Dressing: Woven Gauze Sponge, Non-Sterile 4x4 in 1 x Per Day/30 Days Discharge Instructions: Apply over primary dressing as directed. Secured With: Scientist, forensic, Sterile 4x75 (in/in) 1 x Per Day/30 Days Discharge Instructions: Secure with stretch gauze as directed. Secured With: Transpore Surgical Tape, 2x10 (in/yd) 1 x Per Day/30 Days Discharge Instructions: Secure dressing with tape as directed. Wound #4 - Foot Wound Laterality: Plantar, Left Cleanser: Soap and Water 1 x Per Day/30 Days Discharge Instructions: May shower and wash wound with dial antibacterial soap and water prior to dressing change. Cleanser: Wound Cleanser 1 x Per Day/30 Days Discharge Instructions: Cleanse the wound with wound cleanser prior to applying a clean dressing using gauze sponges, not tissue or cotton balls. Prim Dressing: Sorbalgon AG Dressing 2x2 (in/in) 1 x Per Day/30  Days ary Discharge Instructions: Apply to wound bed as instructed Secondary Dressing: Woven Gauze Sponge, Non-Sterile 4x4 in 1 x Per Day/30 Days Discharge Instructions: Apply over primary dressing as directed. Secured With: Scientist, forensic, Sterile 4x75 (in/in) 1 x Per Day/30 Days Discharge Instructions: Secure with stretch gauze as directed. Secured With: Transpore Surgical Tape, 2x10 (in/yd) 1 x Per Day/30 Days Discharge Instructions: Secure dressing with tape as directed. Patient Medications llergies: Sulfa (Sulfonamide Antibiotics) A Notifications Medication Indication Start End 06/20/2022 lidocaine DOSE topical 4 % cream - cream topical Detweiler, [place] ([id number]) [phone].pdf Page 5 of 10 Electronic Signature(s) Signed: 06/20/2022 4:13:19 PM By: Fredirick Maudlin MD FACS Entered By: Fredirick Maudlin on 06/20/2022 14:32:31 -------------------------------------------------------------------------------- Problem List Details Patient Name: Date of Service: [name], [facility] RO N 06/20/2022 1:30 PM Medical Record Number:  [id number] Patient Account Number: [id number] Date of Birth/Sex: Treating RN: [birth year] (82 y.o. 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 06/12/2022 No Yes 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 06/12/2022 No Yes 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 atrial fibrillation 06/12/2022 No Yes Z79.01 Long term (current) use of  anticoagulants 06/12/2022 No Yes Inactive Problems Resolved Problems Electronic Signature(s) Signed: 06/20/2022 2:28:00 PM By: Fredirick Maudlin MD FACS Entered By: Fredirick Maudlin on 06/20/2022 14:28:00 -------------------------------------------------------------------------------- Progress Note Details Patient Name: Date of Service: [name], [facility] RO N 06/20/2022 1:30 PM Medical Record Number: [id number] Patient Account Number: [id number] Date of Birth/Sex: Treating RN: 12-Aug-1940 (82 y.o. F) Primary Care Provider: Jenne Pane, [facility] Other Clinician: Referring Provider: Treating Provider/Extender: Devoria Albe, [facility] Weeks in Treatment: [place], [place] ([id number]) [id number] Page 6 of 10 Subjective Chief Complaint Information obtained from Patient Patient seen for complaints of Non-Healing Wound. History of Present Illness (HPI) ADMISSION 06/12/2022 This is an 82 year old woman with a history of CVA, crest syndrome, atrial fibrillation, rocker-bottom foot deformity. 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 She has been followed by podiatry for this and I am not entirely clear as to how she came to be referred here. She resides in an assisted living facility. It is not clear what they have been putting on her wound, but on intake, she was noted to have denuded skin on her medial third toe, as well as ulcers on her dorsal great toe and lateral great toe. There is slough accumulation on both of the toe ulcers. There was an odor noted at intake, but after her foot was washed, the odor dissipated. Her toes are folded on top of each other creating areas of abrasion and pockets for moisture collection, which seems to be the primary cause of her ulceration. 06/20/2022: The skin between her toes and on the ball of her foot is completely macerated. There has been more tissue breakdown. The wound on her great  toe has some slough accumulation. Patient History Information obtained from Patient. Family History Unknown History. Social History Former smoker - smoked when she was young, Marital Status - Widowed, Alcohol Use - Never, Drug Use - No History, Caffeine Use - Daily. Medical History Hematologic/Lymphatic Patient has history of Anemia Cardiovascular Patient has history of Angina - a-fib, Hypertension, Vasculitis Endocrine Denies history of Type I Diabetes, Type II Diabetes Musculoskeletal Patient has history of Osteoarthritis Hospitalization/Surgery History - cholecystectomy. - leg surgery. - tonsillectomy. 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 Vitals Time Taken: 1:51 AM, Height: 67 in, Weight: 153 lbs, BMI: 24, Temperature: 97.5 F, Pulse: 96 bpm, Respiratory Rate: 20 breaths/min, Blood Pressure: 121/71 mmHg. Respiratory Normal work of breathing on room air. General Notes: 06/20/2022: The skin between her toes and on the ball of her foot is completely macerated. There has been more tissue breakdown. The wound on her great toe has some slough accumulation. Integumentary (Hair, Skin) Wound #1 status is Open.  Original cause of wound was Footwear Injury. The date acquired was: 05/24/2021. The wound has been in treatment 1 weeks. The wound is located on the Left,Dorsal T Great. The wound measures 0.5cm length x 0.6cm width x 0.1cm depth; 0.236cm^2 area and 0.024cm^3 volume. There oe is Fat Layer (Subcutaneous Tissue) exposed. There is no tunneling or undermining noted. There is a medium amount of serosanguineous drainage noted. The wound margin is distinct with the outline attached to the wound base. There is small (1-33%) red granulation within the wound bed. There is a large (67-100%) amount of necrotic tissue within the wound bed  including Adherent Slough. The periwound skin appearance had no abnormalities noted for texture. The periwound skin appearance exhibited: Dry/Scaly, Maceration, Rubor. Periwound temperature was noted as No Abnormality. Wound #2 status is Open. Original cause of wound was Shear/Friction. The date acquired was: 05/24/2021. The wound has been in treatment 1 weeks. The wound is located on the [employer]. The wound measures 1.3cm length x 2.6cm width x 0.1cm depth; 2.655cm^2 area and 0.265cm^3 volume. There is Fat oe Layer (Subcutaneous Tissue) exposed. There is no tunneling or undermining noted. There is a medium amount of serosanguineous drainage noted. The wound margin is distinct with the outline attached to the wound base. There is small (1-33%) red granulation within the wound bed. There is a large (67-100%) amount of Gan, Jeniffer ([id number]) [id number].pdf Page 7 of 10 necrotic tissue within the wound bed including Adherent Slough. The periwound skin appearance had no abnormalities noted for texture. The periwound skin appearance exhibited: Maceration, Rubor. Periwound temperature was noted as No Abnormality. Wound #3 status is Open. Original cause of wound was Shear/Friction. The date acquired was: 05/24/2021. The wound has been in treatment 1 weeks. The wound is located on the Left,Medial T Third. The wound measures 1cm length x 1cm width x 0.1cm depth; 0.785cm^2 area and 0.079cm^3 volume. There is Fat oe Layer (Subcutaneous Tissue) exposed. There is no tunneling or undermining noted. There is a medium amount of serosanguineous drainage noted. The wound margin is distinct with the outline attached to the wound base. There is small (1-33%) red granulation within the wound bed. There is a large (67-100%) amount of necrotic tissue within the wound bed including Adherent Slough. The periwound skin appearance had no abnormalities noted for texture. The periwound  skin appearance exhibited: Maceration, Rubor. Periwound temperature was noted as No Abnormality. Wound #4 status is Open. Original cause of wound was Skin T ear/Laceration. The date acquired was: 06/20/2022. The wound is located on the [place]. The wound measures 0.2cm length x 0.2cm width x 0.1cm depth; 0.031cm^2 area and 0.003cm^3 volume. There is Fat Layer (Subcutaneous Tissue) exposed. There is no tunneling or undermining noted. There is a medium amount of serosanguineous drainage noted. The wound margin is distinct with the outline attached to the wound base. There is small (1-33%) red granulation within the wound bed. There is a large (67-100%) amount of necrotic tissue within the wound bed including Adherent Slough. The periwound skin appearance had no abnormalities noted for texture. The periwound skin appearance exhibited: Dry/Scaly, Maceration, Rubor. Periwound temperature was noted as No Abnormality. 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 There was a Selective/Open Wound Non-Viable Tissue Debridement oe with a total area of 0.3 sq cm performed by Fredirick Maudlin, MD. With the following instrument(s): Curette to remove Non-Viable tissue/material. Material removed includes [facility] after achieving pain control using Lidocaine 4% Topical Solution. No specimens were taken. A time out was conducted at 14:21, prior to the start of the procedure. A Minimum amount of bleeding was controlled with Pressure. The procedure was  tolerated well. Post Debridement Measurements: 0.5cm length x 0.6cm width x 0.1cm depth; 0.024cm^3 volume. Character of Wound/Ulcer Post Debridement is improved. Post procedure Diagnosis Wound #1: Same as Pre-Procedure General Notes: Scribed for Dr. Celine Ahr by Adline Peals, RN. Wound #3 Pre-procedure diagnosis of Wound #3 is an Abrasion located on the Left,Medial T Third . There was a Selective/Open Wound Non-Viable Tissue Debridement oe with a total area of 1 sq cm performed by Fredirick Maudlin, MD. With the following instrument(s): Curette to remove Non-Viable tissue/material. Material removed includes [facility] after achieving pain control using Lidocaine 4% Topical Solution. No specimens were taken. A time out was conducted at 14:21, prior to the start of the procedure. A Minimum amount of bleeding was controlled with Pressure. The procedure was tolerated well. Post Debridement Measurements: 1cm length x 1cm width x 0.1cm depth; 0.079cm^3 volume. Character of Wound/Ulcer Post Debridement is improved. Post procedure Diagnosis Wound #3: Same as Pre-Procedure General Notes: Scribed for Dr. Celine Ahr by Adline Peals, RN. Plan Follow-up Appointments: Return Appointment in 1 week. - Dr. Celine Ahr - room 2 Anesthetic: (In clinic) Topical Lidocaine 4% applied to wound bed Bathing/ Shower/ Hygiene: May shower and wash wound with soap and water. - keep toes and in between very dry The following medication(s) was prescribed: lidocaine topical 4 % cream cream topical was prescribed at facility WOUND #1: - T Great Wound Laterality: Dorsal, Left oe Cleanser: Soap and Water 1 x Per Day/30 Days Discharge Instructions: May shower and wash wound with dial antibacterial soap and water prior to dressing change. Cleanser: Wound Cleanser 1 x Per Day/30 Days Discharge Instructions: Cleanse the wound with wound cleanser prior to applying a clean dressing using gauze sponges, not tissue or cotton balls. Prim  Dressing: Sorbalgon AG Dressing 2x2 (in/in) 1 x Per Day/30 Days ary Discharge Instructions: Apply to wound bed as instructed Secondary Dressing: Woven Gauze Sponge, Non-Sterile 4x4 in 1 x Per Day/30 Days Discharge Instructions: Apply over primary dressing as directed. Secured With: Scientist, forensic, Sterile 4x75 (in/in) 1 x Per Day/30 Days Discharge Instructions: Secure with stretch gauze as directed. Secured With: Transpore Surgical T ape, 2x10 (in/yd) 1 x Per Day/30 Days Discharge Instructions: Secure dressing with tape as directed. WOUND #2: Darien Ramus Wound Laterality: Left, jaylinne slappey, [place] ([id number]) [id number] Page 8 of 10 Cleanser: Soap and Water 1 x Per Day/30 Days Discharge Instructions: May shower and wash wound with dial antibacterial soap and water prior to dressing change. Cleanser: Wound Cleanser 1 x Per Day/30 Days Discharge Instructions: Cleanse the wound with wound cleanser prior to applying a clean dressing using gauze sponges, not tissue or cotton balls. Prim Dressing: Sorbalgon AG Dressing 2x2 (in/in) 1 x Per Day/30 Days ary Discharge Instructions: Apply to wound bed as instructed Secondary Dressing: Woven Gauze Sponge, Non-Sterile 4x4 in 1 x Per Day/30 Days Discharge Instructions: Apply over primary dressing as directed. Secured With: Scientist, forensic,  Sterile 4x75 (in/in) 1 x Per Day/30 Days Discharge Instructions: Secure with stretch gauze as directed. Secured With: Transpore Surgical Tape, 2x10 (in/yd) 1 x Per Day/30 Days Discharge Instructions: Secure dressing with tape as directed. WOUND #3: - T Third Wound Laterality: Left, Medial oe Cleanser: Soap and Water 1 x Per Day/30 Days Discharge Instructions: May shower and wash wound with dial antibacterial soap and water prior to dressing change. Cleanser: Wound Cleanser 1 x Per Day/30 Days Discharge Instructions: Cleanse the wound  with wound cleanser prior to applying a clean dressing using gauze sponges, not tissue or cotton balls. Prim Dressing: Sorbalgon AG Dressing 2x2 (in/in) 1 x Per Day/30 Days ary Discharge Instructions: Apply to wound bed as instructed Secondary Dressing: Woven Gauze Sponge, Non-Sterile 4x4 in 1 x Per Day/30 Days Discharge Instructions: Apply over primary dressing as directed. Secured With: Scientist, forensic, Sterile 4x75 (in/in) 1 x Per Day/30 Days Discharge Instructions: Secure with stretch gauze as directed. Secured With: Transpore Surgical Tape, 2x10 (in/yd) 1 x Per Day/30 Days Discharge Instructions: Secure dressing with tape as directed. WOUND #4: - Foot Wound Laterality: Plantar, Left Cleanser: Soap and Water 1 x Per Day/30 Days Discharge Instructions: May shower and wash wound with dial antibacterial soap and water prior to dressing change. Cleanser: Wound Cleanser 1 x Per Day/30 Days Discharge Instructions: Cleanse the wound with wound cleanser prior to applying a clean dressing using gauze sponges, not tissue or cotton balls. Prim Dressing: Sorbalgon AG Dressing 2x2 (in/in) 1 x Per Day/30 Days ary Discharge Instructions: Apply to wound bed as instructed Secondary Dressing: Woven Gauze Sponge, Non-Sterile 4x4 in 1 x Per Day/30 Days Discharge Instructions: Apply over primary dressing as directed. Secured With: Scientist, forensic, Sterile 4x75 (in/in) 1 x Per Day/30 Days Discharge Instructions: Secure with stretch gauze as directed. Secured With: Transpore Surgical Tape, 2x10 (in/yd) 1 x Per Day/30 Days Discharge Instructions: Secure dressing with tape as directed. 06/20/2022: The skin between her toes and on the ball of her foot is completely macerated. There has been more tissue breakdown. The wound on her great toe has some slough accumulation. I used a curette to debride slough from the third toe and the great toe wounds. I am not sure how why  her toes or not being properly dried at her facility. I wrote a note asking them to be sure to thoroughly dry between all of her toes, even using a blow dryer on the cool setting to facilitate this. I am concerned that her toe deformities are going to lead to ongoing tissue breakdown unless this is carefully monitored. We will continue silver alginate to all sites. Follow-up in 1 week. Electronic Signature(s) Signed: 06/20/2022 2:33:37 PM By: Fredirick Maudlin MD FACS Entered By: Fredirick Maudlin on 06/20/2022 14:33:37 -------------------------------------------------------------------------------- HxROS Details Patient Name: Date of Service: [name], [facility] RO N 06/20/2022 1:30 PM Medical Record Number: [id number] Patient Account Number: [id number] Date of Birth/Sex: Treating RN: 05/22/1940 (82 y.o. 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 [name], [name] ([id number]) [phone].pdf Page 9 of 10 Endocrine Medical History: Negative for: Type I Diabetes; Type II Diabetes Past Medical History Notes: hypothyroidism Musculoskeletal Medical History: Positive for: 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 Neurologic Medical  History: Past Medical History Notes: stroke Immunizations Pneumococcal Vaccine: Received Pneumococcal Vaccination: Yes Received Pneumococcal Vaccination On or After 60th Birthday: Yes Implantable Devices None Hospitalization / Surgery History Type of Hospitalization/Surgery cholecystectomy leg surgery tonsillectomy Family and Social History Unknown History: Yes; Former  smoker - smoked when she was young; Marital Status - Widowed; Alcohol Use: Never; Drug Use: No History; Caffeine Use: Daily; Financial Concerns: No; Food, Clothing or Shelter Needs: No; Support System Lacking: No; Transportation Concerns: No Electronic Signature(s) Signed: 06/20/2022 4:13:19 PM By: Fredirick Maudlin MD FACS Entered By: Fredirick Maudlin on 06/20/2022 14:31:52 -------------------------------------------------------------------------------- SuperBill Details Patient Name: Date of Service: [name], [facility] RO N 06/20/2022 Medical Record Number: [id number] Patient Account Number: [id number] Date of Birth/Sex: Treating RN: 1940-06-05 (82 y.o. 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 Z79.01 Long term (current) use of anticoagulants Facility Procedures : [place] CPT4 Code: [id number] , [place] ([id number] L Description: 97597 - DEBRIDE WOUND 1ST 20 SQ CM OR < ICD-10 Diagnosis Description L97.522 Non-pressure chronic ulcer of other part of left foot with fat layer exposed 4) [id number] 97.521 Non-pressure chronic ulcer of other part of left foot  limited to breakdown of skin Modifier: [id number] Quantity: 1 27.pdf Page 10 of 10 Physician Procedures : CPT4 Code Description Modifier [id number] - WC PHYS LEVEL 3 - EST PT 25 ICD-10 Diagnosis Description L97.522 Non-pressure chronic ulcer of other part of left foot with fat layer exposed L97.521 Non-pressure  chronic ulcer of other part of left foot  limited to breakdown of skin 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

## 2022-06-21 NOTE — Progress Notes (Signed)
Kathryn [name] ([id number]) [id number] Page 1 of 11 Visit Report for 06/20/2022 Arrival Information Details Patient Name: Date of Service: Kathryn [name] RO N 06/20/2022 1:30 PM Medical Record Number: [id number] Patient Account Number: [id number] Date of Birth/Sex: Treating RN: [birth year] (82 y.o. F) Primary Care Kathryn [name]: Kathryn [name], [facility] [name] Other Clinician: Referring Kathryn [name]: Treating Kathryn [name]/Extender: Kathryn [name], [name] Weeks in Treatment: 1 Visit Information History Since Last Visit All ordered tests and consults were completed: No Patient Arrived: Kathryn [name] Added or deleted any medications: No Arrival Time: 13:51 Any new allergies or adverse reactions: No Accompanied By: staff Had a fall or experienced change in No Transfer Assistance: None activities of daily living that may affect Patient Identification Verified: Yes risk of falls: Secondary Verification Process Completed: Yes Signs or symptoms of abuse/neglect since last visito No Hospitalized since last visit: No Implantable device outside of the clinic excluding No cellular tissue based products placed in the center since last visit: Pain Present Now: No Electronic Signature(s) Signed: 06/20/2022 2:12:12 PM By: Kathryn [name] Entered By: Kathryn [name] on 06/20/2022 13:51:50 -------------------------------------------------------------------------------- Encounter Discharge Information Details Patient Name: Date of Service: Kathryn [name], Kathryn [name] RO N 06/20/2022 1:30 PM Medical Record Number: [id number] Patient Account Number: [id number] Date of Birth/Sex: Treating RN: [birth year] Kathryn [name] (82 y.o. Kathryn [name] Primary Care Kathryn [name]: Kathryn [name], [place] [facility] [name] Other Clinician: Referring Kathryn [name]: Treating Kathryn [name]/Extender: Kathryn [name] Kathryn [name], [name], [facility] Weeks in Treatment: 1 Encounter Discharge Information Items Post Procedure Vitals Discharge Condition: Stable Temperature (F): 97.5 Ambulatory Status:  Wheelchair Pulse (bpm): 96 Discharge Destination: Kathryn [name] Respiratory Rate (breaths/min): 20 Telephoned: No Blood Pressure (mmHg): 121/71 Orders Sent: Yes Transportation: Private Auto Accompanied By: caregiver Schedule Follow-up Appointment: Yes Clinical Summary of Care: Patient Declined Electronic Signature(s) Signed: 06/20/2022 3:42:50 PM By: Kathryn [name] Entered By: Kathryn [name] on 06/20/2022 [name]:39:38 -------------------------------------------------------------------------------- Lower Extremity Assessment Details Patient Name: Date of Service: Kathryn [name] RO N 06/20/2022 1:30 PM Medical Record Number: [id number] Patient Account Number: [id number] Date of Birth/Sex: Treating RN: [birth year] [name] (82 y.o. Kathryn [name] Primary Care Kathryn [name]: Kathryn [name], [facility] Other Clinician: Referring Kennedie Pardoe: Treating Netasha Wehrli/Extender: Kathryn [name] Kathryn [name] Weeks in Treatment: 1 Edema Assessment Left: [Left: Right] [Right: :] Assessed: [Left: No] [Right: No] [Left: Edema] [Right: :] Calf Left: Right: Point of Measurement: From Medial Instep 37 cm Ankle Left: Right: Point of Measurement: From Medial Instep 24.5 cm Electronic Signature(s) Signed: 06/20/2022 3:42:50 PM By: Kathryn [name] Entered By: Kathryn [name] on 06/20/2022 [name]:06:58 -------------------------------------------------------------------------------- Multi Wound Chart Details Patient Name: Date of Service: Kathryn [name], [facility] [name] RO N 06/20/2022 1:30 PM Medical Record Number: [id number] Patient Account Number: [id number] Date of Birth/Sex: Treating RN: Kathryn [name][birth year] (82 y.o. F) Primary Care Jacere Pangborn: Kathryn [name], [facility] [name] [place] Other Clinician: Referring Zamiya Dillard: Treating Josi Roediger/Extender: Kathryn [name], [name] Weeks in Treatment: 1 Vital Signs Height(in): 28 Pulse(bpm): 40 Weight(lbs): 153 Blood Pressure(mmHg): 121/71 Body Mass Index(BMI): 24 Temperature(F): 97.5 Respiratory  Rate(breaths/min): 20 [1:Photos:] Left, Dorsal T Great oe Left, Lateral T Great oe Left, Medial T Third oe Wound Location: Footwear Injury Shear/Friction Shear/Friction Wounding Event: Abrasion Abrasion Abrasion Primary Etiology: Anemia, Angina, Hypertension, Anemia, Angina, Hypertension, Anemia, Angina, Hypertension, Comorbid History: Vasculitis, Osteoarthritis Vasculitis, Osteoarthritis Vasculitis, Osteoarthritis 05/24/2021 05/24/2021 05/24/2021 Date Acquired: '1 1 1 '$ Weeks of Treatment: Open Open Open Wound Status: No No No Wound Recurrence: 0.5x0.6x0.1 1.3x2.6x0.1 1x1x0.1 Measurements L x W x D (cm) 0.236 2.655 0.785 A (cm) : rea 0.024 0.265 0.079 Volume (cm) : 46.40% -18.60% -42.70% % Reduction in Area: 45.50% -  18.30% -43.60% % Reduction in Volume: 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 Classification: Support Structures Support Structures Support Structures Medium Medium Medium Exudate Amount: Serosanguineous Serosanguineous Serosanguineous Exudate Type: red, brown red, brown red, brown Exudate Color: Distinct, outline attached Distinct, outline attached Distinct, outline attached Wound Margin: Small (1-33%) Small (1-33%) Small (1-33%) Granulation Amount: Red Red Red Granulation Quality: Large (67-100%) Large (67-100%) Large (67-100%) Necrotic Amount: Fat Layer (Subcutaneous Tissue): Yes Fat Layer (Subcutaneous Tissue): Yes Fat Layer (Subcutaneous Tissue): Yes Exposed Structures: Fascia: No Fascia: No Fascia: No Tendon: No Tendon: No Tendon: No Muscle: No Muscle: No Muscle: No Joint: No Joint: No Joint: No Bone: No Bone: No Bone: No None None None Epithelialization: [name], [name] ([id number] Page 3 of 11 Debridement - Selective/Open Wound N/A Debridement - Selective/Open Wound Debridement: [name]:21 N/A [name]:21 Pre-procedure Verification/Time  Out Taken: Lidocaine 4% Topical Solution N/A Lidocaine 4% Topical Solution Pain Control: [place] Tissue Debrided: Non-Viable Tissue N/A Non-Viable Tissue Level: 0.3 N/A 1 Debridement A (sq cm): rea Curette N/A Curette Instrument: Minimum N/A Minimum Bleeding: Pressure N/A Pressure Hemostasis A chieved: Procedure was tolerated well N/A Procedure was tolerated well Debridement Treatment Response: 0.5x0.6x0.1 N/A 1x1x0.1 Post Debridement Measurements L x W x D (cm) 0.024 N/A 0.079 Post Debridement Volume: (cm) No Abnormalities Noted No Abnormalities Noted No Abnormalities Noted Periwound Skin Texture: Maceration: Yes Maceration: Yes Maceration: Yes Periwound Skin Moisture: Dry/Scaly: Yes Rubor: Yes Rubor: Yes Rubor: Yes Periwound Skin Color: No Abnormality No Abnormality No Abnormality Temperature: Debridement N/A Debridement Procedures Performed: Wound Number: 4 N/A N/A Photos: N/A N/A Left, Plantar Foot N/A N/A Wound Location: Skin T ear/Laceration N/A N/A Wounding Event: Skin T ear N/A N/A Primary Etiology: Anemia, Angina, Hypertension, N/A N/A Comorbid History: Vasculitis, Osteoarthritis 06/20/2022 N/A N/A Date A cquired: 0 N/A N/A Weeks of Treatment: Open N/A N/A Wound Status: No N/A N/A Wound Recurrence: 0.2x0.2x0.1 N/A N/A Measurements L x W x D (cm) 0.031 N/A N/A A (cm) : rea 0.003 N/A N/A Volume (cm) : N/A N/A N/A % Reduction in A rea: N/A N/A N/A % Reduction in Volume: 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 N/A N/A Classification: Support Structures Medium N/A N/A Exudate A mount: Serosanguineous N/A N/A Exudate Type: red, brown N/A N/A Exudate Color: Distinct, outline attached N/A N/A Wound Margin: Small (1-33%) N/A N/A Granulation A mount: Red N/A N/A Granulation Quality: Large (67-100%) N/A N/A Necrotic A mount: Fat Layer (Subcutaneous Tissue): Yes N/A N/A Exposed Structures: Fascia: No Tendon: No Muscle:  No Joint: No Bone: No None N/A N/A Epithelialization: N/A N/A N/A Debridement: N/A N/A N/A Pain Control: N/A N/A N/A Tissue Debrided: N/A N/A N/A Level: N/A N/A N/A Debridement A (sq cm): rea N/A N/A N/A Instrument: N/A N/A N/A Bleeding: N/A N/A N/A Hemostasis A chieved: Debridement Treatment Response: N/A N/A N/A Post Debridement Measurements L x N/A N/A N/A W x D (cm) N/A N/A N/A Post Debridement Volume: (cm) No Abnormalities Noted N/A N/A Periwound Skin Texture: Maceration: Yes N/A N/A Periwound Skin Moisture: Dry/Scaly: Yes Rubor: Yes N/A N/A Periwound Skin Color: No Abnormality N/A N/A Temperature: N/A N/A N/A Procedures Performed: Treatment Notes Lien, [place] ([id number]) [phone].pdf Page 4 of 11 Electronic Signature(s) Signed: 06/20/2022 2:28:12 PM By: Kathryn Maudlin MD FACS Entered By: Kathryn [name] on 06/20/2022 [name]:28:12 -------------------------------------------------------------------------------- Multi-Disciplinary Care Plan Details Patient Name: Date of Service: Kathryn [place], Kathryn [name] RO N 06/20/2022 1:30 PM Medical Record Number: [id number] Patient Account Number: [id number] Date of  Birth/Sex: Treating RN: [name]-Feb-Kathryn [name] (82 y.o. Kathryn [name] Primary Care Tanya Crothers: Kathryn [name], [facility] Other Clinician: Referring Mykayla Brinton: Treating Azarian Starace/Extender: Kathryn [name], [facility] Weeks in Treatment: 1 Active Inactive Necrotic Tissue Nursing Diagnoses: Impaired tissue integrity related to necrotic/devitalized tissue Knowledge deficit related to management of necrotic/devitalized tissue Goals: Necrotic/devitalized tissue will be minimized in the wound bed Date Initiated: 06/12/2022 Target Resolution Date: 07/27/2022 Goal Status: Active Patient/caregiver will verbalize understanding of reason and process for debridement of necrotic tissue Date Initiated: 06/12/2022 Target Resolution Date: 07/27/2022 Goal Status:  Active Interventions: Assess patient pain level pre-, during and post procedure and prior to discharge Provide education on necrotic tissue and debridement process Treatment Activities: Apply topical anesthetic as ordered : 06/12/2022 Notes: Wound/Skin Impairment Nursing Diagnoses: Impaired tissue integrity Knowledge deficit related to ulceration/compromised skin integrity Goals: Patient/caregiver will verbalize understanding of skin care regimen Date Initiated: 06/12/2022 Target Resolution Date: 07/27/2022 Goal Status: Active Interventions: Assess ulceration(s) every visit Treatment Activities: Skin care regimen initiated : 06/12/2022 Topical wound management initiated : 06/12/2022 Notes: Electronic Signature(s) Signed: 06/20/2022 3:42:50 PM By: Kathryn [name] Entered By: Kathryn [name] on 06/20/2022 [name]:38:30 -------------------------------------------------------------------------------- Pain Assessment Details Patient Name: Date of Service: Kathryn [name] [facility] RO N 06/20/2022 1:30 PM Medical Record Number: [id number] Patient Account Number: [id number] Date of Birth/Sex: Treating RN: Kathryn [name][birth year] (82 y.o. F) Primary Care Aarsh Fristoe: Kathryn [name], [facility] Other Clinician: NAZARI, TOURANGEAU ([id number]) [id number] Page 5 of 11 Referring Gaetana Kawahara: Treating Lilla Callejo/Extender: Kathryn [name] Kathryn [name], [facility] Weeks in Treatment: 1 Active Problems Location of Pain Severity and Description of Pain Patient Has Paino No Site Locations Pain Management and Medication Current Pain Management: Electronic Signature(s) Signed: 06/20/2022 2:12:12 PM By: Kathryn [name] Entered By: Kathryn [name] on 06/20/2022 13:52:24 -------------------------------------------------------------------------------- Patient/Caregiver Education Details Patient Name: Date of Service: Kathryn [name] [facility] RO N 2/28/2024andnbsp1:30 PM Medical Record Number: [id number] Patient Account Number: [id number] Date of  Birth/Gender: Treating RN: [birth year]Kathryn [name] (82 y.o. Kathryn [name] Primary Care Physician: Kathryn [name], [facility] [name] Other Clinician: Referring Physician: Treating Physician/Extender: Kathryn [name], [facility] [name] Weeks in Treatment: 1 Education Assessment Education Provided To: Patient Education Topics Provided Wound/Skin Impairment: Methods: Explain/Verbal Responses: Reinforcements needed, State content correctly Electronic Signature(s) Signed: 06/20/2022 3:42:50 PM By: Kathryn [name] Entered By: Kathryn [name] on 06/20/2022 [name]:38:42 -------------------------------------------------------------------------------- Wound Assessment Details Patient Name: Date of Service: Kathryn [name] [facility] RO N 06/20/2022 1:30 PM Medical Record Number: [id number] Patient Account Number: [id number] Date of Birth/Sex: Treating RN: [birth year]Kathryn [name] (82 y.o. Kathryn [name] Primary Care Kevan Prouty: Kathryn [name], [facility] [name] Other Clinician: Referring Leilynn Pilat: Treating Teshaun Olarte/Extender: Kathryn [name], [facility] Weeks in Treatment: [place], [place] ([id number]) [id number] Page 6 of 11 Wound Status Wound Number: 1 Primary Etiology: Abrasion Wound Location: Left, Dorsal T Great oe Wound Status: Open Wounding Event: Footwear Injury Comorbid History: Anemia, Angina, Hypertension, Vasculitis, Osteoarthritis Date Acquired: 05/24/2021 Weeks Of Treatment: 1 Clustered Wound: No Photos Wound Measurements Length: (cm) 0.5 Width: (cm) 0.6 Depth: (cm) 0.1 Area: (cm) 0.236 Volume: (cm) 0.024 % Reduction in Area: 46.4% % Reduction in Volume: 45.5% Epithelialization: None Tunneling: No Undermining: No Wound Description Classification: 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 Support Structures Wound Margin: Distinct, outline attached Exudate Amount: Medium Exudate Type: Serosanguineous Exudate Color: red, brown Foul Odor After Cleansing: No Slough/Fibrino Yes Wound Bed Granulation Amount:  Small (1-33%) Exposed Structure Granulation Quality: Red Fascia Exposed: No Necrotic Amount: Large (67-100%) Fat Layer (Subcutaneous Tissue) Exposed: Yes Necrotic Quality: Adherent Slough Tendon Exposed: No Muscle Exposed: No Joint Exposed: No Bone Exposed: No  Periwound Skin Texture Texture Color No Abnormalities Noted: Yes No Abnormalities Noted: No Rubor: Yes Moisture No Abnormalities Noted: No Temperature / Pain Dry / Scaly: Yes Temperature: No Abnormality Maceration: Yes Treatment Notes Wound #1 (Toe Great) Wound Laterality: Dorsal, Left Cleanser Soap and Water Discharge Instruction: May shower and wash wound with dial antibacterial soap and water prior to dressing change. Wound Cleanser Discharge Instruction: Cleanse the wound with wound cleanser prior to applying a clean dressing using gauze sponges, not tissue or cotton balls. Peri-Wound Care Topical Primary Dressing Sorbalgon AG Dressing 2x2 (in/in) Discharge Instruction: Apply to wound bed as instructed Secondary Dressing TALITA, MATIN ([id number]) [id number] Page 7 of 11 Woven Gauze Sponge, Non-Sterile 4x4 in Discharge Instruction: Apply over primary dressing as directed. Secured With Conforming Stretch Gauze Bandage Roll, Sterile 4x75 (in/in) Discharge Instruction: Secure with stretch gauze as directed. Transpore Surgical Tape, 2x10 (in/yd) Discharge Instruction: Secure dressing with tape as directed. Compression Wrap Compression Stockings Add-Ons Electronic Signature(s) Signed: 06/20/2022 3:42:50 PM By: Kathryn [name] Entered By: Kathryn [name] on 06/20/2022 [name]:13:34 -------------------------------------------------------------------------------- Wound Assessment Details Patient Name: Date of Service: Kathryn [name] [facility] [name] RO N 06/20/2022 1:30 PM Medical Record Number: [id number] Patient Account Number: [id number] Date of Birth/Sex: Treating RN: [birth year] (82 y.o. Kathryn [name] Primary Care Phoebie Shad: Kathryn [name], [facility] Other Clinician: Referring Dade Rodin: Treating Taite Baldassari/Extender: Kathryn [name] Kathryn [name], [facility] Weeks in Treatment: 1 Wound Status Wound Number: 2 Primary Etiology: Abrasion Wound Location: Left, Lateral T Great oe Wound Status: Open Wounding Event: Shear/Friction Comorbid History: Anemia, Angina, Hypertension, Vasculitis, Osteoarthritis Date Acquired: 05/24/2021 Weeks Of Treatment: 1 Clustered Wound: No Photos Wound Measurements Length: (cm) 1.3 Width: (cm) 2.6 Depth: (cm) 0.1 Area: (cm) 2.655 Volume: (cm) 0.265 % Reduction in Area: -18.6% % Reduction in Volume: -18.3% Epithelialization: None Tunneling: No Undermining: No Wound Description Classification: 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 Support Structures Wound Margin: Distinct, outline attached Exudate Amount: Medium Exudate Type: Serosanguineous Exudate Color: red, brown Foul Odor After Cleansing: No Slough/Fibrino Yes Wound Bed Granulation Amount: Small (1-33%) Exposed Structure Granulation Quality: Red Fascia Exposed: No Necrotic Amount: Large (67-100%) Fat Layer (Subcutaneous Tissue) Exposed: Yes Necrotic Quality: Adherent Slough Tendon Exposed: No Muscle Exposed: No Joint Exposed: No Kathryn [name], Kathryn [name] ([id number] Page 8 of 11 Bone Exposed: No Periwound Skin Texture Texture Color No Abnormalities Noted: Yes No Abnormalities Noted: No Rubor: Yes Moisture No Abnormalities Noted: No Temperature / Pain Maceration: Yes Temperature: No Abnormality Treatment Notes Wound #2 (Toe Great) Wound Laterality: Left, Lateral Cleanser Soap and Water Discharge Instruction: May shower and wash wound with dial antibacterial soap and water prior to dressing change. Wound Cleanser Discharge Instruction: Cleanse the wound with wound cleanser prior to applying a clean dressing using gauze sponges, not tissue or cotton  balls. Peri-Wound Care Topical Primary Dressing Sorbalgon AG Dressing 2x2 (in/in) Discharge Instruction: Apply to wound bed as instructed Secondary Dressing Woven Gauze Sponge, Non-Sterile 4x4 in Discharge Instruction: Apply over primary dressing as directed. Secured With Conforming Stretch Gauze Bandage Roll, Sterile 4x75 (in/in) Discharge Instruction: Secure with stretch gauze as directed. Transpore Surgical Tape, 2x10 (in/yd) Discharge Instruction: Secure dressing with tape as directed. Compression Wrap Compression Stockings Add-Ons Electronic Signature(s) Signed: 06/20/2022 3:42:50 PM By: Kathryn [name] Entered By: Kathryn [name] on 06/20/2022 [name]:[name]:11 -------------------------------------------------------------------------------- Wound Assessment Details Patient Name: Date of Service: Kathryn [name] RO N 06/20/2022 1:30 PM Medical Record Number: [id number] Patient Account Number: [id number] Date of Birth/Sex: Treating RN: [birth year]-Kathryn [name] (82 y.o. Kathryn [name] Primary Care Hayato Guaman: Joseph  Kathryn [name] [facility] Other Clinician: Referring Oneda Duffett: Treating Iana Buzan/Extender: Kathryn [name] KES, WENDY Weeks in Treatment: 1 Wound Status Wound Number: 3 Primary Etiology: Abrasion Wound Location: Left, Medial T Third oe Wound Status: Open Wounding Event: Shear/Friction Comorbid History: Anemia, Angina, Hypertension, Vasculitis, Osteoarthritis Date Acquired: 05/24/2021 Weeks Of Treatment: 1 Clustered Wound: No Photos Kathryn [name], Kathryn [name] ([id number]) [id number] Page 9 of 11 Wound Measurements Length: (cm) 1 Width: (cm) 1 Depth: (cm) 0.1 Area: (cm) 0.785 Volume: (cm) 0.079 % Reduction in Area: -42.7% % Reduction in Volume: -43.6% Epithelialization: None Tunneling: No Undermining: No Wound Description Classification: 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 Support Structures Wound Margin: Distinct, outline attached Exudate Amount: Medium Exudate  Type: Serosanguineous Exudate Color: red, brown Foul Odor After Cleansing: No Slough/Fibrino Yes Wound Bed Granulation Amount: Small (1-33%) Exposed Structure Granulation Quality: Red Fascia Exposed: No Necrotic Amount: Large (67-100%) Fat Layer (Subcutaneous Tissue) Exposed: Yes Necrotic Quality: Adherent Slough Tendon Exposed: No Muscle Exposed: No Joint Exposed: No Bone Exposed: No Periwound Skin Texture Texture Color No Abnormalities Noted: Yes No Abnormalities Noted: No Rubor: Yes Moisture No Abnormalities Noted: No Temperature / Pain Maceration: Yes Temperature: No Abnormality Treatment Notes Wound #3 (Toe Third) Wound Laterality: Left, Medial Cleanser Soap and Water Discharge Instruction: May shower and wash wound with dial antibacterial soap and water prior to dressing change. Wound Cleanser Discharge Instruction: Cleanse the wound with wound cleanser prior to applying a clean dressing using gauze sponges, not tissue or cotton balls. Peri-Wound Care Topical Primary Dressing Sorbalgon AG Dressing 2x2 (in/in) Discharge Instruction: Apply to wound bed as instructed Secondary Dressing Woven Gauze Sponge, Non-Sterile 4x4 in Discharge Instruction: Apply over primary dressing as directed. Secured With Conforming Stretch Gauze Bandage Roll, Sterile 4x75 (in/in) Discharge Instruction: Secure with stretch gauze as directed. Transpore Surgical Tape, 2x10 (in/yd) Discharge Instruction: Secure dressing with tape as directed. Compression Kathryn [name], Kathryn [name] ([id number]) [id number] Page 10 of 11 Compression Stockings Add-Ons Electronic Signature(s) Signed: 06/20/2022 3:42:50 PM By: Sabas Sous By: Kathryn [name] on 06/20/2022 [name]:[name]:39 -------------------------------------------------------------------------------- Wound Assessment Details Patient Name: Date of Service: Kathryn [name] RO N 06/20/2022 1:30 PM Medical Record  Number: [id number] Patient Account Number: [id number] Date of Birth/Sex: Treating RN: [birth year]-Kathryn [name] (82 y.o. Kathryn [name] Primary Care Izetta Sakamoto: Kathryn [name], [place] [name] Other Clinician: Referring Angeligue Bowne: Treating Marcie Shearon/Extender: Kathryn [name] Kathryn [name], [name] Weeks in Treatment: 1 Wound Status Wound Number: 4 Primary Etiology: Skin Tear Wound Location: Left, Plantar Foot Wound Status: Open Wounding Event: Skin Tear/Laceration Comorbid History: Anemia, Angina, Hypertension, Vasculitis, Osteoarthritis Date Acquired: 06/20/2022 Weeks Of Treatment: 0 Clustered Wound: No Photos Wound Measurements Length: (cm) 0.2 Width: (cm) 0.2 Depth: (cm) 0.1 Area: (cm) 0.031 Volume: (cm) 0.003 % Reduction in Area: % Reduction in Volume: Epithelialization: None Tunneling: No Undermining: No Wound Description Classification: 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 Support Structures Wound Margin: Distinct, outline attached Exudate Amount: Medium Exudate Type: Serosanguineous Exudate Color: red, brown Foul Odor After Cleansing: No Slough/Fibrino Yes Wound Bed Granulation Amount: Small (1-33%) Exposed Structure Granulation Quality: Red Fascia Exposed: No Necrotic Amount: Large (67-100%) Fat Layer (Subcutaneous Tissue) Exposed: Yes Necrotic Quality: Adherent Slough Tendon Exposed: No Muscle Exposed: No Joint Exposed: No Bone Exposed: No Periwound Skin Texture Texture Color No Abnormalities Noted: Yes No Abnormalities Noted: No Rubor: Yes Moisture No Abnormalities Noted: No Temperature / Pain Dry / Scaly: Yes Temperature: No Abnormality Maceration: Yes Kathryn [name], Kathryn [name] ([id number].pdf Page 11 of 11 Treatment Notes Wound #4 (Foot) Wound Laterality: Plantar, Left Cleanser Soap and Water Discharge Instruction:  May shower and wash wound with dial antibacterial soap and water prior to dressing change. Wound Cleanser Discharge Instruction: Cleanse the  wound with wound cleanser prior to applying a clean dressing using gauze sponges, not tissue or cotton balls. Peri-Wound Care Topical Primary Dressing Sorbalgon AG Dressing 2x2 (in/in) Discharge Instruction: Apply to wound bed as instructed Secondary Dressing Woven Gauze Sponge, Non-Sterile 4x4 in Discharge Instruction: Apply over primary dressing as directed. Secured With Conforming Stretch Gauze Bandage Roll, Sterile 4x75 (in/in) Discharge Instruction: Secure with stretch gauze as directed. Transpore Surgical Tape, 2x10 (in/yd) Discharge Instruction: Secure dressing with tape as directed. Compression Wrap Compression Stockings Add-Ons Electronic Signature(s) Signed: 06/20/2022 3:42:50 PM By: Kathryn [name] Entered By: Kathryn [name] on 06/20/2022 [name]:15:08 -------------------------------------------------------------------------------- Vitals Details Patient Name: Date of Service: Kathryn [name], [facility] [name] RO N 06/20/2022 1:30 PM Medical Record Number: [id number] Patient Account Number: [id number] Date of Birth/Sex: Treating RN: Kathryn [name], Kathryn [name] (82 y.o. F) Primary Care Ginni Eichler: Kathryn [name], [facility] [name] Other Clinician: Referring Phelan Schadt: Treating Ariyona Eid/Extender: Kathryn [name], [employer] [name] Weeks in Treatment: 1 Vital Signs Time Taken: 01:51 Temperature (F): 97.5 Height (in): 67 Pulse (bpm): 96 Weight (lbs): 153 Respiratory Rate (breaths/min): 20 Body Mass Index (BMI): 24 Blood Pressure (mmHg): 121/71 Reference Range: 80 - 120 mg / dl Electronic Signature(s) Signed: 06/20/2022 2:12:12 PM By: Kathryn [name] Entered By: Kathryn [name] on 06/20/2022 13:52:17

## 2022-06-27 ENCOUNTER — Encounter (HOSPITAL_BASED_OUTPATIENT_CLINIC_OR_DEPARTMENT_OTHER): Payer: Medicare Other | Attending: General Surgery | Admitting: General Surgery

## 2022-06-27 ENCOUNTER — Inpatient Hospital Stay (HOSPITAL_COMMUNITY)
Admission: EM | Admit: 2022-06-27 | Discharge: 2022-07-03 | DRG: 683 | Disposition: A | Discharge status: Home / Self Care | Payer: Medicare Other | Source: Ambulatory Visit | Attending: Internal Medicine | Admitting: Internal Medicine

## 2022-06-27 ENCOUNTER — Other Ambulatory Visit: Payer: Self-pay

## 2022-06-27 ENCOUNTER — Emergency Department (HOSPITAL_COMMUNITY): Payer: Medicare Other

## 2022-06-27 ENCOUNTER — Encounter (HOSPITAL_COMMUNITY): Payer: Self-pay | Admitting: Internal Medicine

## 2022-06-27 DIAGNOSIS — E785 Hyperlipidemia, unspecified: Secondary | ICD-10-CM | POA: Diagnosis present

## 2022-06-27 DIAGNOSIS — N179 Acute kidney failure, unspecified: Principal | ICD-10-CM | POA: Diagnosis present

## 2022-06-27 DIAGNOSIS — E871 Hypo-osmolality and hyponatremia: Secondary | ICD-10-CM | POA: Diagnosis present

## 2022-06-27 DIAGNOSIS — Z8673 Personal history of transient ischemic attack (TIA), and cerebral infarction without residual deficits: Secondary | ICD-10-CM

## 2022-06-27 DIAGNOSIS — M1990 Unspecified osteoarthritis, unspecified site: Secondary | ICD-10-CM | POA: Insufficient documentation

## 2022-06-27 DIAGNOSIS — K219 Gastro-esophageal reflux disease without esophagitis: Secondary | ICD-10-CM | POA: Diagnosis present

## 2022-06-27 DIAGNOSIS — Z7901 Long term (current) use of anticoagulants: Secondary | ICD-10-CM

## 2022-06-27 DIAGNOSIS — R001 Bradycardia, unspecified: Secondary | ICD-10-CM | POA: Diagnosis present

## 2022-06-27 DIAGNOSIS — I48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 atrial fibrillation: Secondary | ICD-10-CM | POA: Insufficient documentation

## 2022-06-27 DIAGNOSIS — E872 Acidosis, unspecified: Secondary | ICD-10-CM | POA: Diagnosis present

## 2022-06-27 DIAGNOSIS — L089 Local infection of the skin and subcutaneous tissue, unspecified: Secondary | ICD-10-CM | POA: Diagnosis present

## 2022-06-27 DIAGNOSIS — Z79899 Other long term (current) drug therapy: Secondary | ICD-10-CM

## 2022-06-27 DIAGNOSIS — Z79890 Hormone replacement therapy (postmenopausal): Secondary | ICD-10-CM

## 2022-06-27 DIAGNOSIS — L97522 Non-pressure chronic ulcer of other part of left foot with fat layer exposed: Secondary | ICD-10-CM | POA: Diagnosis not present

## 2022-06-27 DIAGNOSIS — I10 Essential (primary) hypertension: Secondary | ICD-10-CM | POA: Diagnosis present

## 2022-06-27 DIAGNOSIS — Z7982 Long term (current) use of aspirin: Secondary | ICD-10-CM

## 2022-06-27 DIAGNOSIS — M341 CR(E)ST syndrome: Secondary | ICD-10-CM | POA: Insufficient documentation

## 2022-06-27 DIAGNOSIS — Z881 Allergy status to other antibiotic agents status: Secondary | ICD-10-CM

## 2022-06-27 DIAGNOSIS — Z882 Allergy status to sulfonamides status: Secondary | ICD-10-CM

## 2022-06-27 DIAGNOSIS — E039 Hypothyroidism, unspecified: Secondary | ICD-10-CM | POA: Diagnosis present

## 2022-06-27 DIAGNOSIS — D649 Anemia, unspecified: Secondary | ICD-10-CM | POA: Diagnosis present

## 2022-06-27 DIAGNOSIS — Z87891 Personal history of nicotine dependence: Secondary | ICD-10-CM

## 2022-06-27 LAB — URINALYSIS, W/ REFLEX TO CULTURE (INFECTION SUSPECTED)
Bilirubin Urine: NEGATIVE
Glucose, UA: NEGATIVE mg/dL
Ketones, ur: NEGATIVE mg/dL
Leukocytes,Ua: NEGATIVE
Nitrite: NEGATIVE
Protein, ur: NEGATIVE mg/dL
Specific Gravity, Urine: 1.005 (ref 1.005–1.030)
pH: 5 (ref 5.0–8.0)

## 2022-06-27 LAB — CBC WITH DIFFERENTIAL/PLATELET
Abs Immature Granulocytes: 0.06 10*3/uL (ref 0.00–0.07)
Basophils Absolute: 0.1 10*3/uL (ref 0.0–0.1)
Basophils Relative: 1 %
Eosinophils Absolute: 0.1 10*3/uL (ref 0.0–0.5)
Eosinophils Relative: 1 %
HCT: 32.7 % — ABNORMAL LOW (ref 36.0–46.0)
Hemoglobin: 10 g/dL — ABNORMAL LOW (ref 12.0–15.0)
Immature Granulocytes: 1 %
Lymphocytes Relative: 23 %
Lymphs Abs: 2 10*3/uL (ref 0.7–4.0)
MCH: 26.3 pg (ref 26.0–34.0)
MCHC: 30.6 g/dL (ref 30.0–36.0)
MCV: 86.1 fL (ref 80.0–100.0)
Monocytes Absolute: 0.7 10*3/uL (ref 0.1–1.0)
Monocytes Relative: 8 %
Neutro Abs: 6 10*3/uL (ref 1.7–7.7)
Neutrophils Relative %: 66 %
Platelets: 264 10*3/uL (ref 150–400)
RBC: 3.8 MIL/uL — ABNORMAL LOW (ref 3.87–5.11)
RDW: 18.8 % — ABNORMAL HIGH (ref 11.5–15.5)
WBC: 8.9 10*3/uL (ref 4.0–10.5)
nRBC: 0 % (ref 0.0–0.2)

## 2022-06-27 LAB — COMPREHENSIVE METABOLIC PANEL
ALT: 10 U/L (ref 0–44)
AST: 18 U/L (ref 15–41)
Albumin: 3.9 g/dL (ref 3.5–5.0)
Alkaline Phosphatase: 88 U/L (ref 38–126)
Anion gap: 11 (ref 5–15)
BUN: 53 mg/dL — ABNORMAL HIGH (ref 8–23)
CO2: 21 mmol/L — ABNORMAL LOW (ref 22–32)
Calcium: 8.7 mg/dL — ABNORMAL LOW (ref 8.9–10.3)
Chloride: 98 mmol/L (ref 98–111)
Creatinine, Ser: 1.55 mg/dL — ABNORMAL HIGH (ref 0.44–1.00)
GFR, Estimated: 34 mL/min — ABNORMAL LOW (ref >60)
Glucose, Bld: 98 mg/dL (ref 70–99)
Potassium: 4.3 mmol/L (ref 3.5–5.1)
Sodium: 130 mmol/L — ABNORMAL LOW (ref 135–145)
Total Bilirubin: 0.5 mg/dL (ref 0.3–1.2)
Total Protein: 7.4 g/dL (ref 6.5–8.1)

## 2022-06-27 LAB — BRAIN NATRIURETIC PEPTIDE: B Natriuretic Peptide: 254.9 pg/mL — ABNORMAL HIGH (ref 0.0–100.0)

## 2022-06-27 LAB — TROPONIN I (HIGH SENSITIVITY)
Troponin I (High Sensitivity): 6 ng/L (ref <18)
Troponin I (High Sensitivity): 5 ng/L (ref <18)

## 2022-06-27 LAB — PROTIME-INR
INR: 1.7 — ABNORMAL HIGH (ref 0.8–1.2)
Prothrombin Time: 20.1 seconds — ABNORMAL HIGH (ref 11.4–15.2)

## 2022-06-27 LAB — LACTIC ACID, PLASMA
Lactic Acid, Venous: 2.2 mmol/L (ref 0.5–1.9)
Lactic Acid, Venous: 2.5 mmol/L (ref 0.5–1.9)
Lactic Acid, Venous: 6 mmol/L (ref 0.5–1.9)

## 2022-06-27 LAB — MRSA NEXT GEN BY PCR, NASAL: MRSA by PCR Next Gen: NOT DETECTED

## 2022-06-27 MED ORDER — ESCITALOPRAM OXALATE 5 MG PO TABS
2.5000 mg | ORAL_TABLET | Freq: Every day | ORAL | Status: DC
  Administered 2022-06-27 – 2022-07-02 (×6): 2.5 mg via ORAL
  Filled 2022-06-27 (×7): qty 1

## 2022-06-27 MED ORDER — SODIUM CHLORIDE 0.9 % IV SOLN: INTRAVENOUS | Status: AC

## 2022-06-27 MED ORDER — POLYETHYLENE GLYCOL 3350 17 G PO PACK: 17.0000 g | PACK | Freq: Every day | ORAL | Status: DC | PRN

## 2022-06-27 MED ORDER — ACETAMINOPHEN 325 MG PO TABS
650.0000 mg | ORAL_TABLET | Freq: Four times a day (QID) | ORAL | Status: DC | PRN
  Administered 2022-06-30 – 2022-07-02 (×4): 650 mg via ORAL
  Filled 2022-06-27 (×4): qty 2

## 2022-06-27 MED ORDER — ASPIRIN 81 MG PO TBEC
81.0000 mg | DELAYED_RELEASE_TABLET | Freq: Every day | ORAL | Status: DC
  Administered 2022-06-28 – 2022-07-03 (×6): 81 mg via ORAL
  Filled 2022-06-27 (×6): qty 1

## 2022-06-27 MED ORDER — FERROUS SULFATE 325 (65 FE) MG PO TABS
325.0000 mg | ORAL_TABLET | Freq: Three times a day (TID) | ORAL | Status: DC
  Administered 2022-06-28 – 2022-07-03 (×17): 325 mg via ORAL
  Filled 2022-06-27 (×17): qty 1

## 2022-06-27 MED ORDER — SODIUM CHLORIDE 0.9 % IV BOLUS
500.0000 mL | Freq: Once | INTRAVENOUS | Status: AC
  Administered 2022-06-27: 500 mL via INTRAVENOUS

## 2022-06-27 MED ORDER — ONDANSETRON HCL 4 MG PO TABS: 4.0000 mg | ORAL_TABLET | Freq: Four times a day (QID) | ORAL | Status: DC | PRN

## 2022-06-27 MED ORDER — ACETAMINOPHEN 650 MG RE SUPP: 650.0000 mg | Freq: Four times a day (QID) | RECTAL | Status: DC | PRN

## 2022-06-27 MED ORDER — LEVOTHYROXINE SODIUM 112 MCG PO TABS
112.0000 ug | ORAL_TABLET | Freq: Every day | ORAL | Status: DC
  Administered 2022-06-28 – 2022-07-03 (×6): 112 ug via ORAL
  Filled 2022-06-27 (×6): qty 1

## 2022-06-27 MED ORDER — ONDANSETRON HCL 4 MG/2ML IJ SOLN
4.0000 mg | Freq: Four times a day (QID) | INTRAMUSCULAR | Status: DC | PRN
  Administered 2022-06-27: 4 mg via INTRAVENOUS
  Filled 2022-06-27: qty 2

## 2022-06-27 MED ORDER — MIRTAZAPINE 15 MG PO TABS
15.0000 mg | ORAL_TABLET | Freq: Every day | ORAL | Status: DC
  Administered 2022-06-27 – 2022-07-02 (×6): 15 mg via ORAL
  Filled 2022-06-27 (×6): qty 1

## 2022-06-27 MED ORDER — APIXABAN 2.5 MG PO TABS
2.5000 mg | ORAL_TABLET | Freq: Two times a day (BID) | ORAL | Status: DC
  Administered 2022-06-27 – 2022-07-03 (×12): 2.5 mg via ORAL
  Filled 2022-06-27 (×12): qty 1

## 2022-06-27 MED ORDER — LACTATED RINGERS IV BOLUS: 500.0000 mL | Freq: Once | INTRAVENOUS | Status: DC

## 2022-06-27 MED ORDER — PANTOPRAZOLE SODIUM 40 MG PO TBEC
40.0000 mg | DELAYED_RELEASE_TABLET | Freq: Every day | ORAL | Status: DC
  Administered 2022-06-27 – 2022-07-03 (×7): 40 mg via ORAL
  Filled 2022-06-27 (×7): qty 1

## 2022-06-27 MED ORDER — LACTATED RINGERS IV BOLUS
1000.0000 mL | Freq: Once | INTRAVENOUS | Status: AC
  Administered 2022-06-27: 1000 mL via INTRAVENOUS

## 2022-06-27 MED ORDER — ATORVASTATIN CALCIUM 20 MG PO TABS
20.0000 mg | ORAL_TABLET | Freq: Every day | ORAL | Status: DC
  Administered 2022-06-28 – 2022-07-03 (×6): 20 mg via ORAL
  Filled 2022-06-27 (×6): qty 1

## 2022-06-27 NOTE — ED Notes (Signed)
Pt from [facility]- [phone] Transported by Rae Halsted [phone]

## 2022-06-27 NOTE — ED Triage Notes (Addendum)
Pt sent from wound care center for Bp 88/52 and HR 49. Bp in triage 94/50 with HR 70 Pt denies sob, dizziness, cp, fever, bodyaches, chills

## 2022-06-27 NOTE — ED Notes (Signed)
Manual bp 94/50

## 2022-06-27 NOTE — H&P (Signed)
History and Physical    Patient: [name] [id number] DOB: 08/02/1940 DOA: 06/27/2022 DOS: the patient was seen and examined on 06/27/2022 PCP: Lubertha Sayres, FNP  Patient coming from: Home  Chief Complaint:  Chief Complaint  Patient presents with   Hypotension   HPI: [name] is a 82 y.o. female with medical history significant of anemia, atrial fibrillation, chest pain syndrome, GERD, hyperlipidemia, hypertension, hypothyroidism, history of CVA, history of digital vasculitis, unspecified vision abnormalities, xerophthalmia who was sent from the wound care center due to hypotension and bradycardia.  She has been weak and occasionally mildly lightheaded. No fever, chills or night sweats. No sore throat, rhinorrhea, dyspnea, wheezing or hemoptysis.  No chest pain, palpitations, diaphoresis, PND, orthopnea or pitting edema of the lower extremities.  No appetite changes, abdominal pain, diarrhea, constipation, melena or hematochezia.  No flank pain, dysuria, frequency or hematuria.  No polyuria, polydipsia, polyphagia or blurred vision.  ED course: Initial vital signs were temperature 97.2 F, pulse 70, respiration 20, BP 94/50 mmHg O2 sat 90% on room air.  The patient received NS 500 mL bolus x 1.  Lab work: CBC showed a white count of 8.9, hemoglobin 10.0 g/dL platelets 264.  PT 20.1 and INR 1.7.  Troponin x 2 normal.  CMP showed a sodium 130 and CO2 of 21 mmol/L with a normal anion gap.  BUN was 53, creatinine 1.55 and calcium 8.7 mg/dL.  The rest of the CMP measurements were normal.  Lactic acid was 2.2 mmol/L.  Imaging: Portable 1 view chest radiograph showed resolution of prior heterogenous opacities and prior bilateral pleural effusions.   Review of Systems: As mentioned in the history of present illness. All other systems reviewed and are negative. 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 She has never used smokeless tobacco. She reports that she does not drink alcohol and does not use drugs.  Allergies  Allergen Reactions   Sulfa Antibiotics Other (See Comments)    Reaction     Family History  Family history unknown: Yes    Prior to Admission medications   Medication Sig Start Date End Date Taking? Authorizing Provider  apixaban (ELIQUIS) 5 MG TABS tablet Take 5 mg by mouth 2 (two) times daily.   Yes [provider]  aspirin EC 81 MG tablet Take 81 mg by mouth daily.   Yes [provider]  atorvastatin (LIPITOR) 20 MG tablet Take 20 mg by mouth daily.   Yes [provider]  calcium carbonate (TUMS - DOSED IN MG ELEMENTAL CALCIUM) 500 MG chewable tablet Chew 1 tablet by mouth 2 (two) times daily with a meal.   Yes [provider]  clobetasol (TEMOVATE) 0.05 % external solution Apply 1 Application topically every other day.   Yes [provider]  diltiazem (CARDIZEM CD) 240 MG 24 hr capsule Take 1 capsule (240 mg total) by mouth daily.  08/14/21  Yes Tat, Shanon Brow, MD  docusate sodium (COLACE) 100 MG capsule Take 1 capsule (100 mg total) by mouth daily. 11/16/21 11/16/22 Yes Pappayliou, Barnetta Chapel A, DO  escitalopram (LEXAPRO) 5 MG tablet Take 2.5 mg by mouth at bedtime.   Yes [provider]  ferrous sulfate 325 (65 FE) MG tablet Take 325 mg by mouth 3 (three) times daily with meals.   Yes [provider]  furosemide (LASIX) 20 MG tablet Take 20 mg by mouth 2 (two) times daily.   Yes [provider]  levothyroxine (SYNTHROID)  112 MCG tablet Take 112 mcg by mouth daily before breakfast.   Yes [provider]  Menthol, Topical Analgesic, (BIOFREEZE) 4 % GEL Apply 1 application topically See admin instructions. Apply to right shoulder twice a day   Yes [provider]  Metoprolol Tartrate 75 MG TABS Take 75 mg by mouth 2 (two) times daily. 08/14/21  Yes Tat, Shanon Brow, MD  mirtazapine (REMERON) 15 MG tablet Take 15 mg by mouth at bedtime.   Yes [provider]  mupirocin ointment (BACTROBAN) 2 % Apply 1 Application topically daily. 05/02/22  Yes McDonald, Stephan Minister, DPM  omeprazole (PRILOSEC) 20 MG capsule Take 20 mg by mouth daily.   Yes [provider]  polyethylene glycol (MIRALAX / GLYCOLAX) 17 g packet Take 17 g by mouth daily as needed for mild constipation or moderate constipation. 11/16/21  Yes Pappayliou, Barnetta Chapel A, DO  promethazine (PHENERGAN) 12.5 MG tablet Take 12.5 mg by mouth daily as needed for nausea or vomiting.   Yes [provider]  Propylene Glycol (SYSTANE BALANCE OP) Place 1 drop into both eyes 2 (two) times daily.   Yes [provider]  ramipril (ALTACE) 2.5 MG capsule Take 1 capsule (2.5 mg total) by mouth daily. 02/05/17  Yes Imogene Burn, PA-C  cephALEXin (KEFLEX) 500 MG capsule Take 1 capsule (500 mg total) by mouth 3 (three) times daily. Patient not taking: Reported on 06/27/2022 05/02/22   Criselda Peaches, DPM  vitamin B-12 (CYANOCOBALAMIN) 500 MCG tablet Take 500 mcg by mouth daily. Patient not taking: Reported on 06/27/2022    [provider]    Physical Exam: Vitals:   06/27/22 1230 06/27/22 1245 06/27/22 1300 06/27/22 1330  BP: (!) 95/53 (!) 96/44 (!) 91/46 100/76  Pulse: (!) 107 81 67 (!) 106  Resp: '16 14 15 16  '$ Temp:      TempSrc:      SpO2:   (!) 88%   Weight:       Physical Exam Vitals and nursing note reviewed.  Constitutional:      General: She is awake. She is not in acute distress. HENT:     Head: Normocephalic.      Nose: No rhinorrhea.     Mouth/Throat:     Mouth: Mucous membranes are moist.  Eyes:     General: No scleral icterus.    Pupils: Pupils are equal, round, and reactive to light.  Neck:     Vascular: No JVD.  Cardiovascular:     Rate and Rhythm: Bradycardia present. Rhythm irregular.     Heart sounds: S1 normal and S2 normal.  Pulmonary:     Effort: Pulmonary effort is normal.     Breath sounds: Normal breath sounds.  Abdominal:     General: Bowel sounds are normal. There is no distension.     Palpations: Abdomen is soft.     Tenderness: There is no abdominal tenderness.  There is no guarding.  Musculoskeletal:     Cervical back: Neck supple.     Right lower leg: No edema.     Left lower leg: No edema.  Skin:    General: Skin is warm and dry.  Neurological:     General: No focal deficit present.     Mental Status: She is alert and oriented to person, place, and time.  Psychiatric:        Mood and Affect: Mood normal.        Behavior: Behavior normal. Behavior is cooperative.     Data Reviewed:  Results are pending, will review when available.  Assessment and Plan: Principal Problem:   AKI (acute kidney injury) ([place]) Observation/telemetry. Continue IV fluids. Hold ARB/ACE. Hold diuretic. Avoid hypotension. Avoid nephrotoxins. Monitor intake and output. Monitor renal function electrolytes.  Active Problems:   Persistent atrial fibrillation (HCC) Continue apixaban at lower dose per pharmacy recommendation. Hold calcium channel and beta-blocker for now. Resume in a.m. once GFR, blood pressure and bradycardia better.    Hypertension Hold antihypertensives for now. Continue to monitor blood pressure closely.    Hyperlipidemia Continue atorvastatin 20 mg p.o. daily.    Hypothyroidism Continue levothyroxine 112 mcg p.o. daily.    Normocytic anemia Monitor hematocrit and hemoglobin.      Advance Care Planning:   Code Status: Full Code   Consults:    Family Communication:   Severity of Illness: The appropriate patient status for this patient is OBSERVATION. Observation status is judged to be reasonable and necessary in order to provide the required intensity of service to ensure the patient's safety. The patient's presenting symptoms, physical exam findings, and initial radiographic and laboratory data in the context of their medical condition is felt to place them at decreased risk for further clinical deterioration. Furthermore, it is anticipated that the patient will be medically stable for discharge from the hospital within 2 midnights of admission.   Author: Reubin Milan, MD 06/27/2022 2:17 PM  For on call review [email or web address].   This document was prepared using Dragon voice recognition software and may contain some unintended transcription errors.

## 2022-06-27 NOTE — Progress Notes (Signed)
   06/27/22 1557  Provider Notification  Provider Name/Title Dr. [name]  Date Provider Notified 06/27/22  Time Provider Notified 1557  Method of Notification Page  Notification Reason Critical Result  Test performed and critical result lactic acid 2.5  Date Critical Result Received 06/27/22  Time Critical Result Received 1553  Provider response See new orders  Date of Provider Response 06/27/22  Time of Provider Response 1559

## 2022-06-27 NOTE — ED Provider Notes (Signed)
[place] EMERGENCY DEPARTMENT AT [facility] Provider Note   CSN: [id number] Arrival date & time: 06/27/22  1116     History  Chief Complaint  Patient presents with   Hypotension    [name] is a 82 y.o. female.  82 year old female with prior medical history as detailed below presents for evaluation.  Patient was at wound care clinic.  She was having her left foot evaluated.  She has a chronic wound to the left foot.  Staff at wound care clinic noted that her heart rate was down into the 40s.  Patient was sent to the ED for evaluation.  Patient is without acute complaint.  She denies syncope, lightheadedness, chest pain, shortness of breath, fever, nausea, vomiting, other complaint.  The history is provided by the patient and medical records.       Home Medications Prior to Admission medications   Medication Sig Start Date End Date Taking? Authorizing Provider  apixaban (ELIQUIS) 5 MG TABS tablet Take 5 mg by mouth 2 (two) times daily.   Yes [provider]  aspirin EC 81 MG tablet Take 81 mg by mouth daily.   Yes [provider]  atorvastatin (LIPITOR) 20 MG tablet Take 20 mg by mouth daily.   Yes [provider]  calcium carbonate (TUMS - DOSED IN MG ELEMENTAL CALCIUM) 500 MG chewable tablet Chew 1 tablet by mouth 2 (two) times daily with a meal.   Yes [provider]  clobetasol (TEMOVATE) 0.05 % external solution Apply 1 Application topically every other day.   Yes [provider]  diltiazem (CARDIZEM CD) 240 MG 24 hr capsule Take 1 capsule (240 mg total) by mouth daily. 08/14/21  Yes Tat, Shanon Brow, MD  docusate sodium (COLACE) 100 MG capsule Take 1 capsule (100 mg total) by mouth daily. 11/16/21 11/16/22 Yes Pappayliou, Barnetta Chapel A, DO  escitalopram (LEXAPRO) 5 MG tablet Take 2.5 mg by mouth at bedtime.   Yes [provider]  ferrous sulfate 325 (65 FE) MG tablet Take 325 mg by mouth 3 (three) times daily  with meals.   Yes [provider]  furosemide (LASIX) 20 MG tablet Take 20 mg by mouth 2 (two) times daily.   Yes [provider]  levothyroxine (SYNTHROID) 112 MCG tablet Take 112 mcg by mouth daily before breakfast.   Yes [provider]  Menthol, Topical Analgesic, (BIOFREEZE) 4 % GEL Apply 1 application topically See admin instructions. Apply to right shoulder twice a day   Yes [provider]  Metoprolol Tartrate 75 MG TABS Take 75 mg by mouth 2 (two) times daily. 08/14/21  Yes Tat, Shanon Brow, MD  mirtazapine (REMERON) 15 MG tablet Take 15 mg by mouth at bedtime.   Yes [provider]  mupirocin ointment (BACTROBAN) 2 % Apply 1 Application topically daily. 05/02/22  Yes McDonald, Stephan Minister, DPM  omeprazole (PRILOSEC) 20 MG capsule Take 20 mg by mouth daily.   Yes [provider]  polyethylene glycol (MIRALAX / GLYCOLAX) 17 g packet Take 17 g by mouth daily as needed for mild constipation or moderate constipation. 11/16/21  Yes Pappayliou, Barnetta Chapel A, DO  promethazine (PHENERGAN) 12.5 MG tablet Take 12.5 mg by mouth daily as needed for nausea or vomiting.   Yes [provider]  Propylene Glycol (SYSTANE BALANCE OP) Place 1 drop into both eyes 2 (two) times daily.   Yes [provider]  ramipril (ALTACE) 2.5 MG capsule Take 1 capsule (2.5 mg total) by  mouth daily. 02/05/17  Yes Imogene Burn, PA-C  cephALEXin (KEFLEX) 500 MG capsule Take 1 capsule (500 mg total) by mouth 3 (three) times daily. Patient not taking: Reported on 06/27/2022 05/02/22   Criselda Peaches, DPM  vitamin B-12 (CYANOCOBALAMIN) 500 MCG tablet Take 500 mcg by mouth daily. Patient not taking: Reported on 06/27/2022    [provider]      Allergies    Sulfa antibiotics    Review of Systems   Review of Systems  All other systems reviewed and are negative.   Physical Exam Updated Vital Signs BP (!) 95/54   Pulse (!) 55   Temp (!) 97.2 F (36.2 C)  (Axillary)   Resp 15   Wt 59 kg   SpO2 93%   BMI 23.04 kg/m  Physical Exam Vitals and nursing note reviewed.  Constitutional:      General: She is not in acute distress.    Appearance: Normal appearance. She is well-developed.  HENT:     Head: Normocephalic and atraumatic.     Mouth/Throat:     Mouth: Mucous membranes are dry.  Eyes:     Conjunctiva/sclera: Conjunctivae normal.     Pupils: Pupils are equal, round, and reactive to light.  Cardiovascular:     Rate and Rhythm: Bradycardia present. Rhythm irregular.     Heart sounds: Normal heart sounds.  Pulmonary:     Effort: Pulmonary effort is normal. No respiratory distress.     Breath sounds: Normal breath sounds.  Abdominal:     General: There is no distension.     Palpations: Abdomen is soft.     Tenderness: There is no abdominal tenderness.  Musculoskeletal:        General: No deformity. Normal range of motion.     Cervical back: Normal range of motion and neck supple.  Skin:    General: Skin is warm and dry.     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  No surrounding erythema or suggestion of cellulitis.  Patient with prior history of CREST syndrome.  Distal extremities with changes consistent with same.  Neurological:     General: No focal deficit present.     Mental Status: She is alert.     Comments: Alert, oriented to person, place, time.  Patient is pleasantly confused when it comes to specifics as to her current medications etc.     ED Results / Procedures / Treatments   Labs (all labs ordered are listed, but only abnormal results are displayed) Labs Reviewed  CBC WITH DIFFERENTIAL/PLATELET - Abnormal; Notable for the following components:      Result Value   RBC 3.80 (*)    Hemoglobin 10.0 (*)    HCT 32.7 (*)    RDW 18.8 (*)    All other components within normal limits  PROTIME-INR - Abnormal; Notable for the following components:   Prothrombin Time 20.1 (*)    INR 1.7 (*)    All other components  within normal limits  BRAIN NATRIURETIC PEPTIDE - Abnormal; Notable for the following components:   B Natriuretic Peptide 254.9 (*)    All other components within normal limits  COMPREHENSIVE METABOLIC PANEL - Abnormal; Notable for the following components:   Sodium 130 (*)    CO2 21 (*)    BUN 53 (*)    Creatinine, Ser 1.55 (*)    Calcium 8.7 (*)    GFR, Estimated 34 (*)    All other components within normal  limits  LACTIC ACID, PLASMA - Abnormal; Notable for the following components:   Lactic Acid, Venous 2.2 (*)    All other components within normal limits  CULTURE, BLOOD (ROUTINE X 2)  CULTURE, BLOOD (ROUTINE X 2)  LACTIC ACID, PLASMA  URINALYSIS, W/ REFLEX TO CULTURE (INFECTION SUSPECTED)  TROPONIN I (HIGH SENSITIVITY)  TROPONIN I (HIGH SENSITIVITY)    EKG EKG Interpretation  Date/Time:  Wednesday June 27 2022 11:34:56 EST Ventricular Rate:  44 PR Interval:    QRS Duration: 91 QT Interval:  463 QTC Calculation: 396 R Axis:   91 Text Interpretation: Atrial fibrillation Right axis deviation Consider left ventricular hypertrophy Borderline T abnormalities, anterior leads Confirmed by Dene Gentry [id number]) on 06/27/2022 11:42:44 AM  Radiology DG Chest Port 1 View  Result Date: 06/27/2022 CLINICAL DATA:  Shortness of breath.  Hypotension and bradycardia. EXAM: PORTABLE CHEST 1 VIEW COMPARISON:  Chest radiographs 08/15/2021, 08/07/2021, 07/07/2021 FINDINGS: Cardiac silhouette is again moderately enlarged. Moderate calcifications are again seen within the aortic arch. Resolution of the prior heterogeneous airspace opacities within the right upper lobe and right lower lobe. Resolution of the prior bilateral pleural effusions. No pneumothorax. No acute skeletal abnormality. IMPRESSION: Resolution of the prior heterogeneous airspace opacities within the right upper lobe and right lower lobe. Resolution of the prior bilateral pleural effusions. Electronically Signed   By: Yvonne Kendall  M.D.   On: 06/27/2022 12:53    Procedures Procedures    Medications Ordered in ED Medications  sodium chloride 0.9 % bolus 500 mL (500 mLs Intravenous New Bag/Given 06/27/22 1304)    ED Course/ Medical Decision Making/ A&P                             Medical Decision Making Amount and/or Complexity of Data Reviewed Labs: ordered. Radiology: ordered.  Risk Decision regarding hospitalization.    Medical Screen Complete  This patient presented to the ED with complaint of bradycardia.  This complaint involves an extensive number of treatment options. The initial differential diagnosis includes, but is not limited to, metabolic abnormality, ACS, arrhythmia, etc.  This presentation is: Acute, Chronic, Self-Limited, Previously Undiagnosed, Uncertain Prognosis, Complicated, Systemic Symptoms, and Threat to Life/Bodily Function  Patient with history of A-fib presents from wound care clinic for evaluation of apparent bradycardia.  Patient is asymptomatic.  Onset of symptoms is unclear.  Patient with longstanding history of A-fib.  Heart rate in the ED is in the 40s to 50s.  Patient's blood pressure is for is hovering in the 0000000 to [id number] systolic.  Exam is suggestive of mild dehydration.  Workup suggest same.  Patient with mild AKI.  Patient appears to be on both beta-blocker and possibly Cardizem for rate control of her A-fib.  Gentle hydration initiated here in the ED.  Case discussed briefly with [facility] cardiology.  They agree with ED plan of care.  Cardiology will consult if hospitalist service requires.    Hospitalist service is aware of case and will evaluate for admission.  Patient's son contacted through phone contact.  He confirms patient's FULL CODE STATUS.   Additional history obtained:  External records from outside sources obtained and reviewed including prior ED visits and prior Inpatient records.    Lab Tests:  I ordered and personally interpreted labs.   The pertinent results include: CBC, CMP, lactic acid, INR, BMP, troponin   Imaging Studies ordered:  I ordered imaging studies including CXR  I independently  visualized and interpreted obtained imaging which showed NAD I agree with the radiologist interpretation.   Cardiac Monitoring:  The patient was maintained on a cardiac monitor.  I personally viewed and interpreted the cardiac monitor which showed an underlying rhythm of: AFIB, brady   Medicines ordered:  I ordered medication including IVF  for dehydration  Reevaluation of the patient after these medicines showed that the patient: improved   Problem List / ED Course:  Bradycardia, AKI   Reevaluation:  After the interventions noted above, I reevaluated the patient and found that they have: improved   Disposition:  After consideration of the diagnostic results and the patients response to treatment, I feel that the patent would benefit from admission.          Final Clinical Impression(s) / ED Diagnoses Final diagnoses:  AKI (acute kidney injury) ([place])  Bradycardia    Rx / DC Orders ED Discharge Orders     None         Valarie Merino, MD 06/27/22 1428

## 2022-06-27 NOTE — Progress Notes (Signed)
Dr. [name] made aware of difficulty obtaining O2 sats due to pt hx of digital vasculitis causing multiple amputations. Ear and forehead not working. ED also reported difficulty obtaining O2 sats. Dr. [name] acknowledged and states to attempt and document when unable to obtain due to above mentioned hx.

## 2022-06-27 NOTE — ED Notes (Signed)
Provided pt with sandwich, crackers and coffee. No other request at this time.

## 2022-06-28 ENCOUNTER — Observation Stay (HOSPITAL_COMMUNITY): Payer: Medicare Other

## 2022-06-28 DIAGNOSIS — Z881 Allergy status to other antibiotic agents status: Secondary | ICD-10-CM | POA: Diagnosis not present

## 2022-06-28 DIAGNOSIS — N179 Acute kidney failure, unspecified: Secondary | ICD-10-CM | POA: Diagnosis not present

## 2022-06-28 DIAGNOSIS — E039 Hypothyroidism, unspecified: Secondary | ICD-10-CM | POA: Diagnosis present

## 2022-06-28 DIAGNOSIS — R001 Bradycardia, unspecified: Secondary | ICD-10-CM | POA: Diagnosis present

## 2022-06-28 DIAGNOSIS — Z7901 Long term (current) use of anticoagulants: Secondary | ICD-10-CM | POA: Diagnosis not present

## 2022-06-28 DIAGNOSIS — E871 Hypo-osmolality and hyponatremia: Secondary | ICD-10-CM | POA: Diagnosis present

## 2022-06-28 DIAGNOSIS — Z882 Allergy status to sulfonamides status: Secondary | ICD-10-CM | POA: Diagnosis not present

## 2022-06-28 DIAGNOSIS — E785 Hyperlipidemia, unspecified: Secondary | ICD-10-CM | POA: Diagnosis present

## 2022-06-28 DIAGNOSIS — D649 Anemia, unspecified: Secondary | ICD-10-CM | POA: Diagnosis present

## 2022-06-28 DIAGNOSIS — Z87891 Personal history of nicotine dependence: Secondary | ICD-10-CM | POA: Diagnosis not present

## 2022-06-28 DIAGNOSIS — Z8673 Personal history of transient ischemic attack (TIA), and cerebral infarction without residual deficits: Secondary | ICD-10-CM | POA: Diagnosis not present

## 2022-06-28 DIAGNOSIS — Z7982 Long term (current) use of aspirin: Secondary | ICD-10-CM | POA: Diagnosis not present

## 2022-06-28 DIAGNOSIS — Z79890 Hormone replacement therapy (postmenopausal): Secondary | ICD-10-CM | POA: Diagnosis not present

## 2022-06-28 DIAGNOSIS — Z79899 Other long term (current) drug therapy: Secondary | ICD-10-CM | POA: Diagnosis not present

## 2022-06-28 DIAGNOSIS — I10 Essential (primary) hypertension: Secondary | ICD-10-CM | POA: Diagnosis present

## 2022-06-28 DIAGNOSIS — K219 Gastro-esophageal reflux disease without esophagitis: Secondary | ICD-10-CM | POA: Diagnosis present

## 2022-06-28 DIAGNOSIS — E872 Acidosis, unspecified: Secondary | ICD-10-CM | POA: Diagnosis present

## 2022-06-28 DIAGNOSIS — I48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 atrial fibrillation: Secondary | ICD-10-CM | POA: Diagnosis present

## 2022-06-28 DIAGNOSIS — L089 Local infection of the skin and subcutaneous tissue, unspecified: Secondary | ICD-10-CM | POA: Diagnosis present

## 2022-06-28 LAB — COMPREHENSIVE METABOLIC PANEL
ALT: 9 U/L (ref 0–44)
AST: 14 U/L — ABNORMAL LOW (ref 15–41)
Albumin: 3 g/dL — ABNORMAL LOW (ref 3.5–5.0)
Alkaline Phosphatase: 71 U/L (ref 38–126)
Anion gap: 7 (ref 5–15)
BUN: 42 mg/dL — ABNORMAL HIGH (ref 8–23)
CO2: 22 mmol/L (ref 22–32)
Calcium: 8.2 mg/dL — ABNORMAL LOW (ref 8.9–10.3)
Chloride: 109 mmol/L (ref 98–111)
Creatinine, Ser: 1.19 mg/dL — ABNORMAL HIGH (ref 0.44–1.00)
GFR, Estimated: 46 mL/min — ABNORMAL LOW (ref >60)
Glucose, Bld: 90 mg/dL (ref 70–99)
Potassium: 3.8 mmol/L (ref 3.5–5.1)
Sodium: 138 mmol/L (ref 135–145)
Total Bilirubin: 0.6 mg/dL (ref 0.3–1.2)
Total Protein: 5.9 g/dL — ABNORMAL LOW (ref 6.5–8.1)

## 2022-06-28 LAB — CBC
HCT: 30.3 % — ABNORMAL LOW (ref 36.0–46.0)
Hemoglobin: 9 g/dL — ABNORMAL LOW (ref 12.0–15.0)
MCH: 26 pg (ref 26.0–34.0)
MCHC: 29.7 g/dL — ABNORMAL LOW (ref 30.0–36.0)
MCV: 87.6 fL (ref 80.0–100.0)
Platelets: 210 10*3/uL (ref 150–400)
RBC: 3.46 MIL/uL — ABNORMAL LOW (ref 3.87–5.11)
RDW: 18.9 % — ABNORMAL HIGH (ref 11.5–15.5)
WBC: 7.4 10*3/uL (ref 4.0–10.5)
nRBC: 0 % (ref 0.0–0.2)

## 2022-06-28 LAB — LACTIC ACID, PLASMA: Lactic Acid, Venous: 1.5 mmol/L (ref 0.5–1.9)

## 2022-06-28 LAB — PROCALCITONIN: Procalcitonin: <0.1 ng/mL

## 2022-06-28 MED ORDER — ENSURE ENLIVE PO LIQD
237.0000 mL | Freq: Three times a day (TID) | ORAL | Status: DC
  Administered 2022-06-28 – 2022-07-03 (×15): 237 mL via ORAL

## 2022-06-28 MED ORDER — LACTATED RINGERS IV BOLUS
1000.0000 mL | Freq: Once | INTRAVENOUS | Status: AC
  Administered 2022-06-28: 1000 mL via INTRAVENOUS

## 2022-06-28 MED ORDER — METRONIDAZOLE 500 MG/100ML IV SOLN
500.0000 mg | Freq: Two times a day (BID) | INTRAVENOUS | Status: DC
  Administered 2022-06-28 – 2022-07-01 (×7): 500 mg via INTRAVENOUS
  Filled 2022-06-28 (×7): qty 100

## 2022-06-28 MED ORDER — VANCOMYCIN HCL 1250 MG/250ML IV SOLN
1250.0000 mg | Freq: Once | INTRAVENOUS | Status: AC
  Administered 2022-06-28: 1250 mg via INTRAVENOUS
  Filled 2022-06-28: qty 250

## 2022-06-28 MED ORDER — VANCOMYCIN HCL IN DEXTROSE 1-5 GM/200ML-% IV SOLN: 1000.0000 mg | INTRAVENOUS | Status: DC

## 2022-06-28 MED ORDER — SODIUM CHLORIDE 0.9 % IV SOLN
2.0000 g | INTRAVENOUS | Status: DC
  Administered 2022-06-28 – 2022-07-01 (×4): 2 g via INTRAVENOUS
  Filled 2022-06-28 (×4): qty 20

## 2022-06-28 MED ORDER — SODIUM CHLORIDE 0.9 % IV SOLN: INTRAVENOUS | Status: DC

## 2022-06-28 NOTE — Consult Note (Signed)
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 Seen in the outpatient wound care center by Dr. Celine Ahr last week on 06/20/22.Previously followed by podiatric medicine. Wound type:full thickness,neuropathic Pressure Injury POA: N/A Measurement:Per Dr. Celine Ahr on 06/20/22 after debridement: 1cm x 1cm x 0.1cm Wound bed:red, moist Drainage (amount, consistency, odor) serous, moderate Periwound:Macerated. Dr. Celine Ahr has requested that gauze be placed between digits to absorb moisture, we will continue this in house Dressing procedure/placement/frequency:I will continue the POC put in place by Dr. Celine Ahr last week and patient to follow with her in the outpatient wound care center as directed.  Foot is to be cleansed with soap and water, rinsed and dried thoroughly. As noted above, folded gauze is to be placed between the digits to prevent maceration. Topical care to the wounds at the great toe and other digits is with silver hydrofiber (Aquacel Ag+ Advantage, [employer] # [id number]) secured with Kerlix roll gauze/paper tape. I have added a pressure redistribution heel boot for elevation (Prevalon) and a sacral foam for PI prevention.  [facility] nursing team will not follow, but will remain available to this patient, the nursing and medical teams.  Please re-consult if needed.  Thank you for inviting [place] to participate in this patient's Plan of Care.  Maudie Flakes, MSN, RN, CNS, [place], Serita Grammes, [employer], [employer] phone:  [phone]

## 2022-06-28 NOTE — Progress Notes (Signed)
[name] ([id number]) [id number] Page 1 of 8 Visit Report for 06/27/2022 Chief Complaint Document Details Patient Name: Date of Service: [name] RO N 06/27/2022 10:45 A M Medical Record Number: [id number] Patient Account Number: [id number] Date of Birth/Sex: Treating RN: 1941/03/01 (82 y.o. F) Primary Care Provider: Jenne Pane, [facility] Other Clinician: Referring Provider: Treating Provider/Extender: Devoria Albe, [facility] Weeks in Treatment: 2 Information Obtained from: Patient Chief Complaint Patient seen for complaints of Non-Healing Wound. Electronic Signature(s) Signed: 06/27/2022 12:17:14 PM By: Fredirick Maudlin MD FACS Entered By: Fredirick Maudlin on 06/27/2022 12:17:14 -------------------------------------------------------------------------------- HPI Details Patient Name: Date of Service: [name], [name] 06/27/2022 10:45 A M Medical Record Number: [id number] Patient Account Number: [id number] Date of Birth/Sex: Treating RN: Dec 02, 1940 (82 y.o. F) Primary Care Provider: Jenne Pane, [facility] Other Clinician: Referring Provider: Treating Provider/Extender: Devoria Albe, [facility] Weeks in Treatment: 2 History of Present Illness HPI Description: ADMISSION 06/12/2022 This is an 82 year old woman with a history of CVA, crest syndrome, atrial fibrillation, rocker-bottom foot deformity. 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 She has been followed by podiatry for this and I am not entirely clear as to how she came to be referred here. She resides in an assisted living facility. It is not clear what they have been putting on her wound, but on intake, she was noted to have denuded skin on her medial third toe, as well as ulcers on her dorsal great toe and lateral great toe. There is slough accumulation on both of the toe ulcers. There was an odor noted at intake, but after her foot was  washed, the odor dissipated. Her toes are folded on top of each other creating areas of abrasion and pockets for moisture collection, which seems to be the primary cause of her ulceration. 06/20/2022: The skin between her toes and on the ball of her foot is completely macerated. There has been more tissue breakdown. The wound on her great toe has some slough accumulation. 06/27/2022: No change to her wounds today. There has been no further deterioration, but no significant improvement. She was both hypotensive and bradycardic on intake. Electronic Signature(s) Signed: 06/27/2022 12:18:00 PM By: Fredirick Maudlin MD FACS Entered By: Fredirick Maudlin on 06/27/2022 12:18:00 -------------------------------------------------------------------------------- Physical Exam Details Patient Name: Date of Service: [name] [facility] RO N 06/27/2022 10:45 A M Medical Record Number: [id number] Patient Account Number: [id number] Date of Birth/Sex: Treating RN: July 29, 1940 (82 y.o. F) Primary Care Provider: Jenne Pane, [facility] Other Clinician: Referring Provider: Treating Provider/Extender: Fredirick Maudlin SHO KES, [facility] Weeks in Treatment: 2 Constitutional Hypotensive. Bradycardic. . . no acute distress. Respiratory [name], [name] ([id number]) [id number] Page 2 of 8 Normal work of breathing on room air. Notes 06/27/2022: No change to her wounds today. There has been no further deterioration, but no significant improvement. Electronic Signature(s) Signed: 06/27/2022 12:19:23 PM By: Fredirick Maudlin MD FACS Entered By: Fredirick Maudlin on 06/27/2022 12:19:23 -------------------------------------------------------------------------------- Physician Orders Details Patient Name: Date of Service: [name], [facility] RO N 06/27/2022 10:45 A M Medical Record Number: [id number] Patient Account Number: [id number] Date of Birth/Sex: Treating RN: 1940-06-13 (81 y.o. 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 - Dr. Celine Ahr - room 2 - pt to go ED due to too low BP and heart rate Return A Anesthetic (In clinic) Topical Lidocaine 4% applied to wound bed Bathing/ Shower/ Hygiene May shower and wash wound with soap and water. - keep toes and in between very dry Wound Treatment Wound #1 - T Great oe Wound Laterality: Dorsal, Left Cleanser: Soap and Water 1 x Per Day/30 Days Discharge Instructions: May shower and wash wound with dial antibacterial soap and water prior to dressing change. Cleanser: Wound Cleanser 1 x Per Day/30 Days Discharge Instructions: Cleanse the wound with wound cleanser prior to applying a clean dressing using gauze sponges, not tissue or cotton balls. Prim Dressing: Sorbalgon AG Dressing 2x2 (in/in) 1 x Per Day/30 Days ary Discharge Instructions: Apply to wound bed as instructed Secondary Dressing: Woven Gauze Sponge, Non-Sterile 4x4 in 1 x Per Day/30 Days Discharge Instructions: Apply over primary dressing as directed. Secured With: Scientist, forensic, Sterile 4x75 (in/in) 1 x Per Day/30 Days Discharge Instructions: Secure with stretch gauze as directed. Secured With: Transpore Surgical Tape, 2x10 (in/yd) 1 x Per Day/30 Days Discharge Instructions: Secure dressing with tape as directed. Wound  #2 - T Great oe Wound Laterality: Left, Lateral Cleanser: Soap and Water 1 x Per Day/30 Days Discharge Instructions: May shower and wash wound with dial antibacterial soap and water prior to dressing change. Cleanser: Wound Cleanser 1 x Per Day/30 Days Discharge Instructions: Cleanse the wound with wound cleanser prior to applying a clean dressing using gauze sponges, not tissue or cotton balls. Prim Dressing: Sorbalgon AG Dressing 2x2 (in/in) 1 x Per Day/30 Days ary Discharge Instructions: Apply to wound bed as instructed Joseph, Kathryn ([id number]) [phone].pdf Page 3 of 8 Secondary Dressing: Woven Gauze Sponge, Non-Sterile 4x4 in 1 x Per Day/30 Days Discharge Instructions: Apply over primary dressing as directed. Secured With: Scientist, forensic, Sterile 4x75 (in/in) 1 x Per Day/30 Days Discharge Instructions: Secure with stretch gauze as directed. Secured With: Transpore Surgical Tape, 2x10 (in/yd) 1 x Per Day/30 Days Discharge Instructions: Secure dressing with tape as directed. Wound #3 - T Third oe Wound Laterality: Left, Medial Cleanser: Soap and Water 1 x Per Day/30 Days Discharge Instructions: May shower and wash wound with dial antibacterial soap and water prior to dressing change. Cleanser: Wound Cleanser 1 x Per Day/30 Days Discharge Instructions: Cleanse the wound with wound cleanser prior to applying a clean dressing using gauze sponges, not tissue or cotton balls. Prim Dressing: Sorbalgon AG Dressing 2x2 (in/in) 1 x Per Day/30 Days ary Discharge Instructions: Apply to wound bed as instructed Secondary Dressing: Woven Gauze Sponge, Non-Sterile 4x4 in 1 x Per Day/30 Days Discharge Instructions: Apply over primary dressing as directed. Secured With: Scientist, forensic, Sterile 4x75 (in/in) 1 x Per Day/30 Days Discharge Instructions: Secure with stretch gauze as directed. Secured With: Transpore Surgical Tape,  2x10 (in/yd) 1 x Per Day/30 Days Discharge Instructions: Secure dressing with tape as directed. Wound #4 - Foot Wound Laterality: Plantar, Left Cleanser: Soap and Water 1 x Per Day/30 Days Discharge Instructions: May shower and wash wound with dial antibacterial soap and water prior to dressing change. Cleanser: Wound Cleanser 1 x  Per Day/30 Days Discharge Instructions: Cleanse the wound with wound cleanser prior to applying a clean dressing using gauze sponges, not tissue or cotton balls. Prim Dressing: Sorbalgon AG Dressing 2x2 (in/in) 1 x Per Day/30 Days ary Discharge Instructions: Apply to wound bed as instructed Secondary Dressing: Woven Gauze Sponge, Non-Sterile 4x4 in 1 x Per Day/30 Days Discharge Instructions: Apply over primary dressing as directed. Secured With: Scientist, forensic, Sterile 4x75 (in/in) 1 x Per Day/30 Days Discharge Instructions: Secure with stretch gauze as directed. Secured With: Transpore Surgical Tape, 2x10 (in/yd) 1 x Per Day/30 Days Discharge Instructions: Secure dressing with tape as directed. Patient Medications llergies: Sulfa (Sulfonamide Antibiotics) A Notifications Medication Indication Start End 06/27/2022 lidocaine DOSE topical 4 % cream - cream topical Electronic Signature(s) Signed: 06/27/2022 12:55:37 PM By: Fredirick Maudlin MD FACS Entered By: Fredirick Maudlin on 06/27/2022 12:19:42 -------------------------------------------------------------------------------- Problem List Details Patient Name: Date of Service: [name], [facility] RO N 06/27/2022 10:45 A M Medical Record Number: [id number] Patient Account Number: [id number] Date of Birth/Sex: Treating RN: 07/17/1940 (82 y.o. F) Primary Care Provider: Jenne Pane, [facility] Other Clinician: Referring Provider: Treating Provider/Extender: Devoria Albe, [facility] Weeks in Treatment: [place], [place] ([id number]) [id number] Page 4 of 8 Active  Problems ICD-10 Encounter Code Description Active Date MDM Diagnosis L97.522 Non-pressure chronic ulcer of other part of left foot with fat layer exposed 06/12/2022 No Yes L97.521 Non-pressure chronic ulcer of other part of left foot limited to breakdown of 06/12/2022 No Yes skin I10 Essential (primary) hypertension 06/12/2022 No Yes 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 06/12/2022 No Yes 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 06/12/2022 No Yes 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 F) Primary Care Provider: Jenne Pane, [facility] Other Clinician: Referring Provider: Treating Provider/Extender: Devoria Albe, [facility] Weeks in Treatment: 2 Subjective Chief Complaint Information obtained from Patient Patient seen for complaints of Non-Healing Wound. History of Present Illness (HPI) ADMISSION 06/12/2022 This is an 82 year old woman with a history of CVA, crest syndrome, atrial fibrillation, rocker-bottom foot deformity. 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 She has been followed by podiatry for this and I am not entirely clear as to how she came to be referred here. She resides in an assisted living facility. It is not clear what they have been putting on her wound, but on intake, she was noted to have denuded skin on her medial third toe, as well as ulcers on her dorsal  great toe and lateral great toe. There is slough accumulation on both of the toe ulcers. There was an odor noted at intake, but after her foot was washed, the odor dissipated. Her toes are folded on top of each other creating areas of abrasion and pockets for moisture collection, which seems to be the primary cause of her ulceration. 06/20/2022: The skin between her toes and on the ball of her foot is completely macerated. There has been more tissue breakdown. The wound on her great toe has some slough accumulation. 06/27/2022: No change to her wounds today.  There has been no further deterioration, but no significant improvement. She was both hypotensive and bradycardic on intake. Kathryn, Joseph ([id number]) [id number] Page 5 of 8 Patient History Information obtained from Patient. Family History Unknown History. Social History Former smoker - smoked when she was young, Marital Status - Widowed, Alcohol Use - Never, Drug Use - No History, Caffeine Use - Daily. Medical History Hematologic/Lymphatic Patient has history of Anemia Cardiovascular Patient has history of Angina - a-fib, Hypertension, Vasculitis Endocrine Denies history of Type I Diabetes, Type II Diabetes Musculoskeletal Patient has history of Osteoarthritis Hospitalization/Surgery History - cholecystectomy. - leg surgery. - tonsillectomy. 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 Bradycardic. no acute distress. Vitals Time Taken: 10:22 AM, Height: 67 in, Weight: 153 lbs, BMI: 24, Temperature: 97.6 F, Pulse: 42 bpm, Respiratory Rate: 18 breaths/min, Blood Pressure: 78/54 mmHg. General Notes: BP and HR readings too low, advised to go to ED after visit. Water provided to patient. Respiratory Normal work of breathing on room air. General Notes:  06/27/2022: No change to her wounds today. There has been no further deterioration, but no significant improvement. Integumentary (Hair, Skin) Wound #1 status is Open. Original cause of wound was Footwear Injury. The date acquired was: 05/24/2021. The wound has been in treatment 2 weeks. The wound is located on the Left,Dorsal T Great. The wound measures 0.5cm length x 0.7cm width x 0.1cm depth; 0.275cm^2 area and 0.027cm^3 volume. There oe is Fat Layer (Subcutaneous Tissue) exposed. There is no tunneling or undermining noted. There is a medium amount of serosanguineous drainage noted. The wound margin is distinct with the outline attached to the wound base. There is small (1-33%) red granulation within the wound bed. There is a large (67-100%) amount of necrotic tissue within the wound bed including Adherent Slough. The periwound skin appearance had no abnormalities noted for texture. The periwound skin appearance exhibited: Dry/Scaly, Maceration, Rubor. Periwound temperature was noted as No Abnormality. Wound #2 status is Open. Original cause of wound was Shear/Friction. The date acquired was: 05/24/2021. The wound has been in treatment 2 weeks. The wound is located on the [employer]. The wound measures 1cm length x 1.2cm width x 0.1cm depth; 0.942cm^2 area and 0.094cm^3 volume. There is Fat oe Layer (Subcutaneous Tissue) exposed. There is a medium amount of serosanguineous drainage noted. The wound margin is distinct with the outline attached to the wound base. There is small (1-33%) red granulation within the wound bed. There is a large (67-100%) amount of necrotic tissue within the wound bed including Adherent Slough. The periwound skin appearance had no abnormalities noted for texture. The periwound skin appearance exhibited: Maceration, Rubor. Periwound temperature was noted as No Abnormality. Wound #3 status is Open. Original cause of wound was Shear/Friction. The date acquired was:  05/24/2021. The wound has been in treatment 2 weeks. The wound is located on the Left,Medial T Third. The wound measures 0.5cm length x 1cm width x 0.1cm depth; 0.393cm^2 area and 0.039cm^3 volume. There is Fat oe Layer (Subcutaneous Tissue) exposed. There is a medium amount of serosanguineous drainage noted. The wound margin is distinct with the outline attached to the wound base. There is small (1-33%) red granulation within the wound bed. There is a large (67-100%) amount of necrotic tissue within the wound bed including Adherent Slough. The periwound skin appearance had no abnormalities noted for texture. The periwound skin appearance exhibited: Maceration, Rubor. Periwound temperature was  noted as No Abnormality. Wound #4 status is Open. Original cause of wound was Skin T ear/Laceration. The date acquired was: 06/20/2022. The wound has been in treatment 1 weeks. The wound is located on the [place]. The wound measures 0.1cm length x 0.1cm width x 0.1cm depth; 0.008cm^2 area and 0.001cm^3 volume. There is no tunneling or undermining noted. There is a none present amount of drainage noted. The wound margin is distinct with the outline attached to the wound base. There is no granulation within the wound bed. There is no necrotic tissue within the wound bed. The periwound skin appearance had no abnormalities noted for texture. The periwound skin appearance had no abnormalities noted for moisture. The periwound skin appearance exhibited: Rubor. Periwound temperature was noted as No Abnormality. 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 - Dr. Celine Ahr - room 2 - pt to go ED due to too low BP and heart rate Anesthetic: (In clinic) Topical Lidocaine 4% applied to wound bed Bathing/ Shower/ Hygiene: May shower and wash wound with soap and water. - keep toes and in between very dry The following medication(s) was prescribed: lidocaine topical 4 % cream cream topical was prescribed at facility WOUND #1: - T Great Wound Laterality: Dorsal, Left oe Cleanser: Soap and Water 1 x Per Day/30 Days Discharge Instructions: May shower and wash wound with dial antibacterial soap and water prior to dressing change. Cleanser: Wound Cleanser 1 x Per Day/30 Days Discharge Instructions: Cleanse the wound with wound cleanser prior to applying a clean dressing using gauze sponges, not tissue or cotton balls. Prim Dressing: Sorbalgon AG Dressing 2x2 (in/in) 1 x Per Day/30 Days ary Discharge Instructions: Apply to wound bed as instructed Secondary Dressing: Woven Gauze Sponge, Non-Sterile 4x4 in 1 x Per Day/30 Days Discharge Instructions: Apply over primary dressing as directed. Secured With: Scientist, forensic, Sterile 4x75 (in/in) 1 x Per Day/30 Days Discharge Instructions: Secure with stretch gauze as directed. Secured With: Transpore Surgical T ape, 2x10 (in/yd) 1 x Per Day/30 Days Discharge Instructions: Secure dressing with tape as directed. WOUND #2: - T Great Wound Laterality: Left, Lateral oe Cleanser: Soap and Water 1 x Per Day/30 Days Discharge Instructions: May shower and wash wound with dial antibacterial soap and water prior to dressing change. Cleanser: Wound Cleanser 1 x Per Day/30 Days Discharge Instructions: Cleanse the wound with wound cleanser prior to applying a clean dressing using gauze sponges, not tissue or cotton balls. Prim Dressing: Sorbalgon AG Dressing 2x2 (in/in) 1  x Per Day/30 Days ary Discharge Instructions: Apply to wound bed as instructed Secondary Dressing: Woven Gauze Sponge, Non-Sterile 4x4 in 1 x Per Day/30 Days Discharge Instructions: Apply over primary dressing as directed. Secured With: Scientist, forensic, Sterile 4x75 (in/in) 1 x Per Day/30 Days Discharge Instructions: Secure with stretch gauze as directed. Secured With: Transpore Surgical T ape, 2x10 (in/yd) 1 x Per Day/30 Days Discharge Instructions: Secure dressing with tape as directed. WOUND #3: - T Third Wound Laterality: Left, Medial oe Cleanser: Soap and Water 1 x Per Day/30 Days Discharge Instructions: May shower and  wash wound with dial antibacterial soap and water prior to dressing change. Cleanser: Wound Cleanser 1 x Per Day/30 Days Discharge Instructions: Cleanse the wound with wound cleanser prior to applying a clean dressing using gauze sponges, not tissue or cotton balls. Prim Dressing: Sorbalgon AG Dressing 2x2 (in/in) 1 x Per Day/30 Days ary Discharge Instructions: Apply to wound bed as instructed Secondary Dressing: Woven Gauze Sponge, Non-Sterile 4x4 in 1 x Per Day/30 Days Discharge Instructions: Apply over primary dressing as directed. Secured With: Scientist, forensic, Sterile 4x75 (in/in) 1 x Per Day/30 Days Discharge Instructions: Secure with stretch gauze as directed. Secured With: Transpore Surgical T ape, 2x10 (in/yd) 1 x Per Day/30 Days Discharge Instructions: Secure dressing with tape as directed. WOUND #4: - Foot Wound Laterality: Plantar, Left Cleanser: Soap and Water 1 x Per Day/30 Days Discharge Instructions: May shower and wash wound with dial antibacterial soap and water prior to dressing change. Cleanser: Wound Cleanser 1 x Per Day/30 Days Discharge Instructions: Cleanse the wound with wound cleanser prior to applying a clean dressing using gauze sponges, not tissue or cotton balls. Prim Dressing: Sorbalgon AG  Dressing 2x2 (in/in) 1 x Per Day/30 Days ary Discharge Instructions: Apply to wound bed as instructed Secondary Dressing: Woven Gauze Sponge, Non-Sterile 4x4 in 1 x Per Day/30 Days Discharge Instructions: Apply over primary dressing as directed. Secured With: Scientist, forensic, Sterile 4x75 (in/in) 1 x Per Day/30 Days Discharge Instructions: Secure with stretch gauze as directed. Secured With: Transpore Surgical T ape, 2x10 (in/yd) 1 x Per Day/30 Days Discharge Instructions: Secure dressing with tape as directed. 06/27/2022: No change to her wounds today. There has been no further deterioration, but no significant improvement. No debridement was necessary today. We will continue silver alginate. The aide from the patient's care facility indicated that a different wound care nurse was there this week and used foam instead of gauze and silver alginate between the patient's toes, which in the past has resulted in more moisture being retained. I made a specific note on the patient's paperwork to not use foam to try and minimize this occurrence. Given that the blood pressure and heart rate obtained [name], [name] ([id number]) [phone].pdf Page 7 of 8 today are unusual for her and she was feeling more tired than is normal for her, we elected to take her to the emergency department for further evaluation and management. Of note, she did receive both of her blood pressure medications, ramipril and diltiazem this morning. She will follow-up in 1 week. Electronic Signature(s) Signed: 06/27/2022 12:21:31 PM By: Fredirick Maudlin MD FACS Entered By: Fredirick Maudlin on 06/27/2022 12:21:31 -------------------------------------------------------------------------------- HxROS Details Patient Name: Date of Service: [name], [facility] RO N 06/27/2022 10:45 A M Medical Record Number: [id number] Patient Account Number: [id number] Date of Birth/Sex: Treating RN: Feb 18, 1941  (82 y.o. F) Primary Care Provider: Jenne Pane, [facility] Other Clinician: Referring Provider: Treating Provider/Extender: Devoria Albe, [facility] Weeks in Treatment: 2 Information Obtained From Patient Hematologic/Lymphatic Medical History: Positive for: Anemia Cardiovascular Medical History: Positive for: Angina - a-fib; Hypertension; 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 Endocrine Medical History: Negative for: Type I Diabetes; Type II Diabetes Past Medical History Notes: hypothyroidism Musculoskeletal Medical History: Positive for: 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 Neurologic Medical History: Past Medical History Notes: stroke Immunizations Pneumococcal Vaccine: Received Pneumococcal Vaccination: Yes Received Pneumococcal Vaccination On or After 60th Birthday: Yes Implantable Devices None Hospitalization / Surgery History Type of Hospitalization/Surgery cholecystectomy  leg surgery tonsillectomy Family and Social History Unknown History: Yes; Former smoker - smoked when she was young; Marital Status - Widowed; Alcohol Use: Never; Drug Use: No History; Caffeine Use: Daily; Financial Concerns: No; Food, Clothing or Shelter Needs: No; Support System Lacking: No; Transportation Concerns: No Electronic Signature(s) Signed: 06/27/2022 12:55:37 PM By: Fredirick Maudlin MD FACS [name], [name] ([id number]) By: Fredirick Maudlin MD FACS [phone].pdf Page 8 of 8 Signed: 06/27/2022 12:55:37 PM Entered By: Fredirick Maudlin on 06/27/2022 12:18:11 -------------------------------------------------------------------------------- SuperBill Details Patient Name: Date of Service: [name] [facility] RO N 06/27/2022 Medical Record Number: [id number] Patient Account Number: [id number] Date of Birth/Sex: Treating RN: [birth year] (82 y.o. 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 atrial fibrillation Z79.01 Long term (current) use of anticoagulants Facility Procedures : CPT4 Code: [id number] Description: 99213 - WOUND CARE VISIT-LEV 3 EST PT Modifier: Quantity: 1 Physician Procedures : CPT4 Code Description Modifier [id number] - WC PHYS LEVEL 3 - EST PT ICD-10 Diagnosis Description L97.522 Non-pressure chronic ulcer of other part of left foot with fat layer exposed L97.521 Non-pressure chronic ulcer of other part of left foot  limited to breakdown of skin 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

## 2022-06-28 NOTE — Progress Notes (Addendum)
PROGRESS NOTE    [name]  [id number] DOB: 1940/07/20 DOA: 06/27/2022 PCP: Lubertha Sayres, FNP   Brief Narrative: 82 year old with past medical history significant for anemia, A-fib, chest pain syndrome, GERD, hyperlipidemia, hypertension, hypothyroidism, history of CVA, history of digital vasculitis, unspecified facial abnormality, who presented from wound care center due to hypotension and bradycardia.  Patient reports feeling weak and having occasionally lightheaded.  Evaluation in the ED she was found to have a blood pressure 94/50, heart rate in the 70s.  She received IV bolus.  Lactic acid 2.2 overnight increased to 6.  Chest x-ray showed resolution of prior heterogeneous opacities and prior bilateral pleural effusion.   Assessment & Plan:   Principal Problem:   AKI (acute kidney injury) ([place]) Active Problems:   Persistent atrial fibrillation (HCC)   Hypertension   Hyperlipidemia   Hypothyroidism   Normocytic anemia   1-Lactic acidosis, Hypotension; Concern for infection left foot. She has mild redness.  Repeat lactic acid.  Received IV fluids.  Continue to hold metoprolol , Cardizem.  Started on IV antibiotics to cover foot infection.   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  Continue with wound care.  X ray ordered.  Started IV antibiotics.   Bradycardia: hold Cardizem and metoprolol/  Will need to adjust medications prior to discharge.   Hyponatremia; Received IV fluids.  Monitor.   AKI: in setting hypotension.  Improved with Fluids.  Continue to hold diuretics and ramipril.   Persistent A-fib: Holding metoprolol and Cardizem due to bradycardia and hypotension.   Hypertension:  Hold metoprolol, Cardizem due to hypotension.   Hypothyroidism: Continue with synthroid.   Normocytic anemia: Monitor hb.     Estimated body mass index is 19.03 kg/m as calculated from the  following:   Height as of this encounter: '5\' 7"'$  (1.702 m).   Weight as of this encounter: 55.1 kg.   DVT prophylaxis: eliquis Code Status: Wishes to be full code Family Communication: son over phone Disposition Plan:  Status is: Observation The patient remains OBS appropriate and will d/c before 2 midnights.    Consultants:  None  Procedures:    Antimicrobials:    Subjective: She is feeling better. Last week she did had some episode of diarrhea every other day.  She denies cough. She had some times dysuria   Objective: Vitals:   06/27/22 1835 06/27/22 2015 06/28/22 0026 06/28/22 0404  BP:  (!) 96/46 (!) 97/50 (!) 100/52  Pulse:  71 (!) 56 61  Resp:  '18 16 18  '$ Temp:  97.7 F (36.5 C) 97.7 F (36.5 C) 97.7 F (36.5 C)  TempSrc:  Oral Oral Oral  SpO2:   94% 95%  Weight: 55.1 kg     Height: '5\' 7"'$  (1.702 m)       Intake/Output Summary (Last 24 hours) at 06/28/2022 0749 Last data filed at 06/28/2022 0430 Gross per 24 hour  Intake 60 ml  Output 950 ml  Net -890 ml   Filed Weights   06/27/22 1127 06/27/22 1835  Weight: 59 kg 55.1 kg    Examination:  General exam: Appears calm and comfortable  Respiratory system: Clear to auscultation. Respiratory effort normal. Cardiovascular system: S1 & S2 heard, RRR.  Gastrointestinal system: Abdomen is nondistended, soft and nontender. Central nervous system: Alert and oriented.  Extremities: Symmetric 5 x 5 power. Skin: Left foot with redness, deformity toes.     Data Reviewed: I have personally  reviewed following labs and imaging studies  CBC: Recent Labs  Lab 06/27/22 1159 06/28/22 0431  WBC 8.9 7.4  NEUTROABS 6.0  --   HGB 10.0* 9.0*  HCT 32.7* 30.3*  MCV 86.1 87.6  PLT 264 [id number]   Basic Metabolic Panel: Recent Labs  Lab 06/27/22 1159 06/28/22 0431  NA 130* 138  K 4.3 3.8  CL 98 109  CO2 21* 22  GLUCOSE 98 90  BUN 53* 42*  CREATININE 1.55* 1.19*  CALCIUM 8.7* 8.2*   GFR: Estimated Creatinine  Clearance: 32.8 mL/min (A) (by C-G formula based on SCr of 1.19 mg/dL (H)). Liver Function Tests: Recent Labs  Lab 06/27/22 1159 06/28/22 0431  AST 18 14*  ALT 10 9  ALKPHOS 88 71  BILITOT 0.5 0.6  PROT 7.4 5.9*  ALBUMIN 3.9 3.0*   No results for input(s): "LIPASE", "AMYLASE" in the last 168 hours. No results for input(s): "AMMONIA" in the last 168 hours. Coagulation Profile: Recent Labs  Lab 06/27/22 1159  INR 1.7*   Cardiac Enzymes: No results for input(s): "CKTOTAL", "CKMB", "CKMBINDEX", "TROPONINI" in the last 168 hours. BNP (last 3 results) No results for input(s): "PROBNP" in the last 8760 hours. HbA1C: No results for input(s): "HGBA1C" in the last 72 hours. CBG: No results for input(s): "GLUCAP" in the last 168 hours. Lipid Profile: No results for input(s): "CHOL", "HDL", "LDLCALC", "TRIG", "CHOLHDL", "LDLDIRECT" in the last 72 hours. Thyroid Function Tests: No results for input(s): "TSH", "T4TOTAL", "FREET4", "T3FREE", "THYROIDAB" in the last 72 hours. Anemia Panel: No results for input(s): "VITAMINB12", "FOLATE", "FERRITIN", "TIBC", "IRON", "RETICCTPCT" in the last 72 hours. Sepsis Labs: Recent Labs  Lab 06/27/22 1159 06/27/22 1430 06/27/22 1802  LATICACIDVEN 2.2* 2.5* 6.0*    Recent Results (from the past 240 hour(s))  MRSA Next Gen by PCR, Nasal     Status: None   Collection Time: 06/27/22  9:22 PM   Specimen: Nasal Mucosa; Nasal Swab  Result Value Ref Range Status   MRSA by PCR Next Gen NOT DETECTED NOT DETECTED Final    Comment: (NOTE) The GeneXpert MRSA Assay (FDA approved for NASAL specimens only), is one component of a comprehensive MRSA colonization surveillance program. It is not intended to diagnose MRSA infection nor to guide or monitor treatment for MRSA infections. Test performance is not FDA approved in patients less than 64 years old. Performed at [facility], [place] [address] [place], [place] [address]           Radiology Studies: [facility] Chest Port 1 View  Result Date: 06/27/2022 CLINICAL DATA:  Shortness of breath.  Hypotension and bradycardia. EXAM: PORTABLE CHEST 1 VIEW COMPARISON:  Chest radiographs 08/15/2021, 08/07/2021, 07/07/2021 FINDINGS: Cardiac silhouette is again moderately enlarged. Moderate calcifications are again seen within the aortic arch. Resolution of the prior heterogeneous airspace opacities within the right upper lobe and right lower lobe. Resolution of the prior bilateral pleural effusions. No pneumothorax. No acute skeletal abnormality. IMPRESSION: Resolution of the prior heterogeneous airspace opacities within the right upper lobe and right lower lobe. Resolution of the prior bilateral pleural effusions. Electronically Signed   By: Yvonne Kendall M.D.   On: 06/27/2022 12:53        Scheduled Meds:  apixaban  2.5 mg Oral BID   aspirin EC  81 mg Oral Daily   atorvastatin  20 mg Oral Daily   escitalopram  2.5 mg Oral QHS   ferrous sulfate  325 mg Oral TID WC  levothyroxine  112 mcg Oral QAC breakfast   mirtazapine  15 mg Oral QHS   pantoprazole  40 mg Oral Daily   Continuous Infusions:  sodium chloride 100 mL/hr at 06/28/22 0307   cefTRIAXone (ROCEPHIN)  IV 2 g (06/28/22 0739)   metronidazole 500 mg (06/28/22 0741)   vancomycin       LOS: 0 days    Time spent: 35 minutes    Masiah Woody A Zahira Brummond, MD [facility]   If 7PM-7AM, please contact night-coverage [email or web address]  06/28/2022, 7:49 AM

## 2022-06-28 NOTE — TOC Initial Note (Signed)
Transition of Care [facility]) - Initial/Assessment Note    Patient Details  Name: [name] MRN: [id number] Date of Birth: [birth year]  [facility] CM/SW Contact:    Henrietta Dine, RN Phone Number: 06/28/2022, 10:20 AM  Clinical Narrative:                 TOC for d/c needs; pt oriented x 1; called pt's son Morton Stall [phone]); he says pt is from [facility] and he plans for her to return at d/c; spoke w/ Cristie Hem, owner of facility; she verified pt's residency and level of care; Alex says pt is on oxygen, and needs help w/ dressing and baths; pt has chronic wounds chronic wounds; see note per WOC; TOC will follow.  Expected Discharge Plan:  ([place]) Barriers to Discharge: Continued Medical Work up   Patient Goals and CMS Choice Patient states their goals for this hospitalization and ongoing recovery are:: per pt's son Morton Stall back to [place]          Expected Discharge Plan and Services   Discharge Planning Services: CM Consult   Living arrangements for the past 2 months: [place]                                      Prior Living Arrangements/Services Living arrangements for the past 2 months: [place] Lives with:: Facility Resident Patient language and need for interpreter reviewed:: Yes Do you feel safe going back to the place where you live?: Yes      Need for Family Participation in Patient Care: Yes (Comment) Care giver support system in place?: Yes (comment) Current home services: DME ([facility], wheel chair. home, oxygen) Criminal Activity/Legal Involvement Pertinent to Current Situation/Hospitalization: No - Comment as needed  Activities of Daily Living Home Assistive Devices/Equipment: Environmental consultant (specify type), Wheelchair ADL Screening (condition at time of admission) Patient's cognitive ability adequate to safely complete daily activities?: No Is the  patient deaf or have difficulty hearing?: Yes Does the patient have difficulty seeing, even when wearing glasses/contacts?: No Does the patient have difficulty concentrating, remembering, or making decisions?: Yes Patient able to express need for assistance with ADLs?: Yes Does the patient have difficulty dressing or bathing?: Yes Independently performs ADLs?: No Communication: Independent Dressing (OT): Needs assistance Is this a change from baseline?: Pre-admission baseline Grooming: Needs assistance Is this a change from baseline?: Pre-admission baseline Feeding: Independent Bathing: Dependent Is this a change from baseline?: Pre-admission baseline Toileting: Needs assistance Is this a change from baseline?: Pre-admission baseline In/Out Bed: Needs assistance Is this a change from baseline?: Pre-admission baseline Walks in Home: Needs assistance Does the patient have difficulty walking or climbing stairs?: No Weakness of Legs: Both Weakness of Arms/Hands: Both  Permission Sought/Granted Permission sought to share information with : Case Manager Permission granted to share information with : Yes, Verbal Permission Granted        Permission granted to share info w Relationship: Morton Stall (son) [phone]     Emotional Assessment   Attitude/Demeanor/Rapport: Unable to Assess Affect (typically observed): Unable to Assess Orientation: : Oriented to Self Alcohol / Substance Use: Not Applicable Psych Involvement: No (comment)  Admission diagnosis:  Bradycardia [R00.1] AKI (acute kidney injury) ([place]) [N17.9] Patient Active Problem List   Diagnosis Date Noted   AKI (acute kidney injury) ([place]) 06/27/2022   Normocytic anemia  06/27/2022   Hypomagnesemia 08/09/2021   Pneumococcal bacteremia 08/08/2021   Anemia of chronic disease    Pneumococcal pneumonia ([place]) 123456   Acute metabolic encephalopathy 123456   Acute respiratory failure with hypoxia ([place]) 08/07/2021    Septic shock ([place]) 08/07/2021   Hypokalemia 08/07/2021   COVID-19 virus infection 07/07/2021   CAP (community acquired pneumonia) 07/07/2021   Dysphagia, oropharyngeal phase 04/23/2021   Vitamin D deficiency A999333   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, [place], [address] [place], [place] [address] Phone: [phone] Fax: [phone]     Social Determinants of Health (SDOH) Social History: SDOH Screenings   Food Insecurity: No Food Insecurity (06/27/2022)  Housing: Low Risk  (06/27/2022)  Transportation Needs: No Transportation Needs (06/27/2022)  Utilities: Not At Risk (06/27/2022)  Tobacco Use: Medium Risk (06/27/2022)   SDOH Interventions:     Readmission Risk Interventions    08/11/2021   10:29 AM 08/09/2021    2:34 PM  Readmission Risk Prevention Plan  Transportation Screening Complete Complete  PCP or Specialist Appt within 3-5 Days Complete   HRI or Home Care Consult Complete Complete  Social Work Consult for [place] Planning/Counseling Complete Complete  Palliative Care Screening Not Applicable Not Applicable  Medication Review Press photographer) Complete Complete

## 2022-06-28 NOTE — Progress Notes (Signed)
Pharmacy Antibiotic Note  [name] is a 82 y.o. female admitted on 06/27/2022 with hypotension. Lactic acid trending up overnight. Patient remains afebrile. SCr 1.55 on admission, down to 1.19 today (baseline < 1.0). Pharmacy has been consulted for vancomycin dosing for sepsis.  Plan: -Vancomycin 1250 mg IV x 1 followed by 1000 mg IV q36h -Rocephin/flagyl per MD -Continue to follow renal function, cultures and clinical progress for dose adjustments and de-escalation as indicated  Height: '5\' 7"'$  (170.2 cm) Weight: 55.1 kg (121 lb 7.6 oz) IBW/kg (Calculated) : 61.6  Temp (24hrs), Avg:97.6 F (36.4 C), Min:97.2 F (36.2 C), Max:97.8 F (36.6 C)  Recent Labs  Lab 06/27/22 1159 06/27/22 1430 06/27/22 1802 06/28/22 0431  WBC 8.9  --   --  7.4  CREATININE 1.55*  --   --  1.19*  LATICACIDVEN 2.2* 2.5* 6.0*  --     Estimated Creatinine Clearance: 32.8 mL/min (A) (by C-G formula based on SCr of 1.19 mg/dL (H)).    Allergies  Allergen Reactions   Sulfa Antibiotics Other (See Comments)    Reaction     Antimicrobials this admission: Rocephin 3/7 >> Flagyl 3/7 >> Vancomycin 3/7 >>  Dose adjustments this admission: NA  Microbiology results: 3/6 BCx: ngtd 3/6 MRSA PCR: negative  Thank you for allowing pharmacy to be a part of this patient's care.  Tawnya Crook, PharmD, BCPS Clinical Pharmacist 06/28/2022 10:28 AM

## 2022-06-29 DIAGNOSIS — N179 Acute kidney failure, unspecified: Secondary | ICD-10-CM | POA: Diagnosis not present

## 2022-06-29 LAB — CREATININE, SERUM
Creatinine, Ser: 0.87 mg/dL (ref 0.44–1.00)
GFR, Estimated: >60 mL/min (ref >60)

## 2022-06-29 MED ORDER — METOPROLOL TARTRATE 25 MG PO TABS
12.5000 mg | ORAL_TABLET | Freq: Two times a day (BID) | ORAL | Status: DC
  Administered 2022-06-29 (×2): 12.5 mg via ORAL
  Filled 2022-06-29 (×2): qty 1

## 2022-06-29 MED ORDER — VANCOMYCIN HCL 750 MG/150ML IV SOLN
750.0000 mg | INTRAVENOUS | Status: DC
  Administered 2022-06-29 – 2022-07-01 (×3): 750 mg via INTRAVENOUS
  Filled 2022-06-29 (×4): qty 150

## 2022-06-29 MED ORDER — MELATONIN 5 MG PO TABS
10.0000 mg | ORAL_TABLET | Freq: Every evening | ORAL | Status: DC | PRN
  Administered 2022-06-29 – 2022-06-30 (×2): 10 mg via ORAL
  Filled 2022-06-29 (×2): qty 2

## 2022-06-29 MED ORDER — METOPROLOL TARTRATE 25 MG PO TABS: 25.0000 mg | ORAL_TABLET | Freq: Two times a day (BID) | ORAL | Status: DC

## 2022-06-29 NOTE — Progress Notes (Signed)
SORANGEL, LOPRESTI ([id number]) [id number] Page 1 of 13 Visit Report for 06/27/2022 Arrival Information Details Patient Name: Date of Service: [name] 06/27/2022 10:45 A M Medical Record Number: [id number] Patient Account Number: [id number] Date of Birth/Sex: Treating RN: April 03, 1941 (82 y.o. [name], Lovena Le Primary Care Taelor Waymire: Jenne Pane, [facility] Other Clinician: Referring Marquita Lias: Treating [name]/Extender: Devoria Albe, [facility] Weeks in Treatment: 2 Visit Information History Since Last Visit Added or deleted any medications: No Patient Arrived: Wheel Chair Any new allergies or adverse reactions: No Arrival Time: 10:19 Had a fall or experienced change in No Accompanied By: caregiver activities of daily living that may affect Transfer Assistance: Manual risk of falls: Patient Identification Verified: Yes Signs or symptoms of abuse/neglect since last visito No Secondary Verification Process Completed: Yes Hospitalized since last visit: No Implantable device outside of the clinic excluding No cellular tissue based products placed in the center since last visit: Has Dressing in Place as Prescribed: Yes Pain Present Now: No Electronic Signature(s) Signed: 06/27/2022 4:53:16 PM By: Adline Peals Entered By: Adline Peals on 06/27/2022 10:22:38 -------------------------------------------------------------------------------- Clinic Level of Care Assessment Details Patient Name: Date of Service: [name] 06/27/2022 10:45 A M Medical Record Number: [id number] Patient Account Number: [id number] Date of Birth/Sex: Treating RN: 1940/05/07 (82 y.o. [name] Primary Care Kahealani Yankovich: Jenne Pane, [facility] Other Clinician: Referring Ainara Eldridge: Treating Sindhu Nguyen/Extender: Fredirick Maudlin SHO Carlynn Spry, [facility] Weeks in Treatment: 2 Clinic Level of Care Assessment Items TOOL 4 Quantity Score X- 1 0 Use when only an EandM is  performed on FOLLOW-UP visit ASSESSMENTS - Nursing Assessment / Reassessment X- 1 10 Reassessment of Co-morbidities (includes updates in patient status) X- 1 5 Reassessment of Adherence to Treatment Plan ASSESSMENTS - Wound and Skin A ssessment / Reassessment X - Simple Wound Assessment / Reassessment - one wound 1 5 '[]'$  - 0 Complex Wound Assessment / Reassessment - multiple wounds '[]'$  - 0 Dermatologic / Skin Assessment (not related to wound area) ASSESSMENTS - Focused Assessment '[]'$  - 0 Circumferential Edema Measurements - multi extremities '[]'$  - 0 Nutritional Assessment / Counseling / Intervention '[]'$  - 0 Lower Extremity Assessment (monofilament, tuning fork, pulses) '[]'$  - 0 Peripheral Arterial Disease Assessment (using hand held doppler) ASSESSMENTS - Ostomy and/or Continence Assessment and Care '[]'$  - 0 Incontinence Assessment and Management '[]'$  - 0 Ostomy Care Assessment and Management (repouching, etc.) PROCESS - Coordination of Care X - Simple Patient / Family Education for ongoing care 1 [place], [place] ([id number]) [phone].pdf Page 2 of 13 '[]'$  - 0 Complex (extensive) Patient / Family Education for ongoing care X- 1 10 Staff obtains Programmer, systems, Records, T Results / Process Orders est '[]'$  - 0 Staff telephones HHA, Nursing Homes / Clarify orders / etc '[]'$  - 0 Routine Transfer to another Facility (non-emergent condition) X- 1 10 Routine Hospital Admission (non-emergent condition) '[]'$  - 0 New Admissions / Biomedical engineer / Ordering NPWT Apligraf, etc. , '[]'$  - 0 Emergency Hospital Admission (emergent condition) X- 1 10 Simple Discharge Coordination '[]'$  - 0 Complex (extensive) Discharge Coordination PROCESS - Special Needs '[]'$  - 0 Pediatric / Minor Patient Management '[]'$  - 0 Isolation Patient Management '[]'$  - 0 Hearing / Language / Visual special needs '[]'$  - 0 Assessment of Community assistance (transportation, D/C planning, etc.) '[]'$  -  0 Additional assistance / Altered mentation '[]'$  - 0 Support Surface(s) Assessment (bed, cushion, seat, etc.) INTERVENTIONS - Wound Cleansing / Measurement '[]'$  - 0 Simple Wound Cleansing - one wound  X- 1 5 Complex Wound Cleansing - multiple wounds '[]'$  - 0 Wound Imaging (photographs - any number of wounds) '[]'$  - 0 Wound Tracing (instead of photographs) '[]'$  - 0 Simple Wound Measurement - one wound X- 4 5 Complex Wound Measurement - multiple wounds INTERVENTIONS - Wound Dressings X - Small Wound Dressing one or multiple wounds 1 10 '[]'$  - 0 Medium Wound Dressing one or multiple wounds '[]'$  - 0 Large Wound Dressing one or multiple wounds X- 1 5 Application of Medications - topical '[]'$  - 0 Application of Medications - injection INTERVENTIONS - Miscellaneous '[]'$  - 0 External ear exam '[]'$  - 0 Specimen Collection (cultures, biopsies, blood, body fluids, etc.) '[]'$  - 0 Specimen(s) / Culture(s) sent or taken to Lab for analysis '[]'$  - 0 Patient Transfer (multiple staff / Civil Service fast streamer / Similar devices) '[]'$  - 0 Simple Staple / Suture removal (25 or less) '[]'$  - 0 Complex Staple / Suture removal (26 or more) '[]'$  - 0 Hypo / Hyperglycemic Management (close monitor of Blood Glucose) '[]'$  - 0 Ankle / Brachial Index (ABI) - do not check if billed separately X- 1 5 Vital Signs Has the patient been seen at the hospital within the last three years: Yes Total Score: 110 Level Of Care: New/Established - Level 3 Electronic Signature(s) Signed: 06/27/2022 4:53:16 PM By: Adline Peals Entered By: Adline Peals on 06/27/2022 11:30:03 [place], [place] ([id number] Page 3 of 13 -------------------------------------------------------------------------------- Encounter Discharge Information Details Patient Name: Date of Service: [name] 06/27/2022 10:45 A M Medical Record Number: [id number] Patient Account Number: [id number] Date of Birth/Sex: Treating  RN: Jul 11, 1940 (82 y.o. [name] Primary Care Alistar Mcenery: Jenne Pane, [facility] Other Clinician: Referring Aviyon Hocevar: Treating Mindel Friscia/Extender: Fredirick Maudlin SHO Carlynn Spry, [facility] Weeks in Treatment: 2 Encounter Discharge Information Items Discharge Condition: Stable Ambulatory Status: Wheelchair Discharge Destination: Emergency Room Telephoned: No Orders Sent: Yes Transportation: Private Auto Accompanied By: caregiver Schedule Follow-up Appointment: Yes Clinical Summary of Care: Patient Declined Electronic Signature(s) Signed: 06/27/2022 4:53:16 PM By: Sabas Sous By: Adline Peals on 06/27/2022 11:30:43 -------------------------------------------------------------------------------- Lower Extremity Assessment Details Patient Name: Date of Service: [name] 06/27/2022 10:45 A M Medical Record Number: [id number] Patient Account Number: [id number] Date of Birth/Sex: Treating RN: [birth year] (82 y.o. [name] Primary Care Korissa Horsford: Jenne Pane, [facility] Other Clinician: Referring Demosthenes Virnig: Treating Garry Bochicchio/Extender: Fredirick Maudlin SHO KES, [facility] Weeks in Treatment: 2 Edema Assessment Assessed: [Left: No] [Right: No] [Left: Edema] [Right: :] Calf Left: Right: Point of Measurement: From Medial Instep 37 cm Ankle Left: Right: Point of Measurement: From Medial Instep 24.5 cm Electronic Signature(s) Signed: 06/27/2022 4:53:16 PM By: Adline Peals Entered By: Adline Peals on 06/27/2022 10:30:24 -------------------------------------------------------------------------------- Multi Wound Chart Details Patient Name: Date of Service: Danton Clap, [facility] [name] 06/27/2022 10:45 A M Medical Record Number: [id number] Patient Account Number: [id number] Date of Birth/Sex: Treating RN: 1941/03/19 (82 y.o. F) Primary Care Malayna Noori: Jenne Pane, [facility] Other Clinician: Referring Franchot Pollitt: Treating Anshi Jalloh/Extender: Fredirick Maudlin SHO Carlynn Spry, [facility] Weeks in  Treatment: 2 Vital Signs Height(in): 67 Pulse(bpm): 42 Weight(lbs): 153 Blood Pressure(mmHg): 78/54 [place], [place] ([id number]) [id number].pdf Page 4 of 13 Body Mass Index(BMI): 24 Temperature(F): 97.6 Respiratory Rate(breaths/min): 18 [1:Photos: No Photos Left, Dorsal T Great oe Wound Location: Footwear Injury Wounding Event: Abrasion Primary Etiology: Anemia, Angina, Hypertension, Comorbid History: Vasculitis, Osteoarthritis 05/24/2021 Date Acquired: 2 Weeks of Treatment: Open Wound Status: No  Wound Recurrence: 0.5x0.7x0.1 Measurements L x W x D (cm) 0.275 A (cm) :  rea 0.027 Volume (cm) : 37.50% % Reduction in Area: 38.60% % Reduction in Volume: 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 Classification: Support Structures Medium Exudate Amount:  Serosanguineous Exudate Type: red, brown Exudate Color: Distinct, outline attached Wound Margin: Small (1-33%) Granulation Amount: Red Granulation Quality: Large (67-100%) Necrotic Amount: Fat Layer (Subcutaneous Tissue): Yes Fat Layer (Subcutaneous  Tissue): Yes Fat Layer (Subcutaneous Tissue): Yes Exposed Structures: Fascia: No Tendon: No Muscle: No Joint: No Bone: No None Epithelialization: No Abnormalities Noted Periwound Skin Texture: Maceration: Yes Periwound Skin Moisture: Dry/Scaly: Yes  Rubor: Yes Periwound Skin Color: No Abnormality Temperature:] [2:No Photos Left, Lateral T Great oe Shear/Friction Abrasion Anemia, Angina, Hypertension, Vasculitis, Osteoarthritis 05/24/2021 2 Open No 1x1.2x0.1 0.942 0.094 57.90% 58.00% Full Thickness  Without Exposed Support Structures Medium Serosanguineous red, brown Distinct, outline attached Small (1-33%) Red Large (67-100%) Fascia: No Tendon: No Muscle: No Joint: No Bone: No None No Abnormalities Noted Maceration: Yes Rubor: Yes No Abnormality]  [3:No Photos Left, Medial T Third oe Shear/Friction Abrasion Anemia, Angina, Hypertension, Vasculitis, Osteoarthritis 05/24/2021 2 Open No 0.5x1x0.1 0.393  0.039 28.50% 29.10% 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 Support Structures Medium Serosanguineous red,  brown Distinct, outline attached Small (1-33%) Red Large (67-100%) Fascia: No Tendon: No Muscle: No Joint: No Bone: No None No Abnormalities Noted Maceration: Yes Rubor: Yes No Abnormality] Wound Number: 4 [name] [name] Photos: No Photos [name] [name] Left, Plantar Foot [name] [name] Wound Location: Skin T ear/Laceration [name] [name] Wounding Event: Skin T ear [name] [name] Primary Etiology: Anemia, Angina, Hypertension, [name] [name] Comorbid History: Vasculitis, Osteoarthritis 06/20/2022 [name] [name] Date Acquired: 1 [name] [name] Weeks of Treatment: Open [name] [name] Wound Status: No [name] [name] Wound Recurrence: 0.1x0.1x0.1 [name] [name] Measurements L x W x D (cm) 0.008 [name] [name] A (cm) : rea 0.001 [name] [name] Volume (cm) : 74.20% [name] [name] % Reduction in Area: 66.70% [name] [name] % Reduction in Volume: 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 [name] [name] Classification: Support Structures None Present [name] [name] Exudate Amount: [name] [name] [name] Exudate Type: [name] [name] [name] Exudate Color: Distinct, outline attached [name] [name] Wound Margin: None Present (0%) [name] [name] Granulation Amount: [name] [name] [name] Granulation Quality: None Present (0%) [name] [name] Necrotic Amount: Fascia: No [name] [name] Exposed Structures: Fat Layer (Subcutaneous Tissue): No Tendon: No Muscle: No Joint: No Bone: No Large (67-100%) [name] [name] Epithelialization: No Abnormalities Noted [name] [name] Periwound Skin Texture: Maceration: Yes [name] [name] Periwound Skin Moisture: Dry/Scaly: Yes Rubor: Yes [name] [name] Periwound Skin Color: No Abnormality [name] [name] Temperature: Treatment Notes Lazen[place], Lou ([id number]) [phone].pdf Page 5 of 13 Wound #1 (Toe Great) Wound Laterality: Dorsal, Left Cleanser Soap and Water Discharge Instruction: May shower and wash wound with dial antibacterial soap and water prior to dressing change. Wound Cleanser Discharge Instruction: Cleanse the  wound with wound cleanser prior to applying a clean dressing using gauze sponges, not tissue or cotton balls. Peri-Wound Care Topical Primary Dressing Sorbalgon AG Dressing 2x2 (in/in) Discharge Instruction: Apply to wound bed as instructed Secondary Dressing Woven Gauze Sponge, Non-Sterile 4x4 in Discharge Instruction: Apply over primary dressing as directed. Secured With Conforming Stretch Gauze Bandage Roll, Sterile 4x75 (in/in) Discharge Instruction: Secure with stretch gauze as directed. Transpore Surgical Tape, 2x10 (in/yd) Discharge Instruction: Secure dressing with tape as directed. Compression Wrap Compression Stockings Add-Ons Wound #2 (Toe Great) Wound Laterality: Left, Lateral Cleanser Soap and Water Discharge Instruction: May shower and wash wound with dial antibacterial soap and water prior to dressing change. Wound Cleanser Discharge Instruction: Cleanse the wound with  wound cleanser prior to applying a clean dressing using gauze sponges, not tissue or cotton balls. Peri-Wound Care Topical Primary Dressing Sorbalgon AG Dressing 2x2 (in/in) Discharge Instruction: Apply to wound bed as instructed Secondary Dressing Woven Gauze Sponge, Non-Sterile 4x4 in Discharge Instruction: Apply over primary dressing as directed. Secured With Conforming Stretch Gauze Bandage Roll, Sterile 4x75 (in/in) Discharge Instruction: Secure with stretch gauze as directed. Transpore Surgical Tape, 2x10 (in/yd) Discharge Instruction: Secure dressing with tape as directed. Compression Wrap Compression Stockings Add-Ons Wound #3 (Toe Third) Wound Laterality: Left, Medial Cleanser Soap and Water Discharge Instruction: May shower and wash wound with dial antibacterial soap and water prior to dressing change. Wound Cleanser Discharge Instruction: Cleanse the wound with wound cleanser prior to applying a clean dressing using gauze sponges, not tissue or cotton balls. Peri-Wound  Care Topical [place], [place] ([id number]) [id number] Page 6 of 13 Primary Dressing Sorbalgon AG Dressing 2x2 (in/in) Discharge Instruction: Apply to wound bed as instructed Secondary Dressing Woven Gauze Sponge, Non-Sterile 4x4 in Discharge Instruction: Apply over primary dressing as directed. Secured With Conforming Stretch Gauze Bandage Roll, Sterile 4x75 (in/in) Discharge Instruction: Secure with stretch gauze as directed. Transpore Surgical Tape, 2x10 (in/yd) Discharge Instruction: Secure dressing with tape as directed. Compression Wrap Compression Stockings Add-Ons Wound #4 (Foot) Wound Laterality: Plantar, Left Cleanser Soap and Water Discharge Instruction: May shower and wash wound with dial antibacterial soap and water prior to dressing change. Wound Cleanser Discharge Instruction: Cleanse the wound with wound cleanser prior to applying a clean dressing using gauze sponges, not tissue or cotton balls. Peri-Wound Care Topical Primary Dressing Sorbalgon AG Dressing 2x2 (in/in) Discharge Instruction: Apply to wound bed as instructed Secondary Dressing Woven Gauze Sponge, Non-Sterile 4x4 in Discharge Instruction: Apply over primary dressing as directed. Secured With Conforming Stretch Gauze Bandage Roll, Sterile 4x75 (in/in) Discharge Instruction: Secure with stretch gauze as directed. Transpore Surgical Tape, 2x10 (in/yd) Discharge Instruction: Secure dressing with tape as directed. Compression Wrap Compression Stockings Add-Ons Electronic Signature(s) Signed: 06/27/2022 12:17:00 PM By: Fredirick Maudlin MD FACS Entered By: Fredirick Maudlin on 06/27/2022 12:16:59 -------------------------------------------------------------------------------- Multi-Disciplinary Care Plan Details Patient Name: Date of Service: Danton Clap, [facility] [name] 06/27/2022 10:45 A M Medical Record Number: [id number] Patient Account Number: [id number] Date of Birth/Sex: Treating  RN: [birth year] (82 y.o. [name] Primary Care Marilin Kofman: Jenne Pane, [facility] Other Clinician: Referring Raneen Jaffer: Treating Delno Blaisdell/Extender: Devoria Albe, [facility] Weeks in Treatment: [id number] [place], [place] ([id number]) [id number] Page 7 of 13 Necrotic Tissue Nursing Diagnoses: Impaired tissue integrity related to necrotic/devitalized tissue Knowledge deficit related to management of necrotic/devitalized tissue Goals: Necrotic/devitalized tissue will be minimized in the wound bed Date Initiated: 06/12/2022 Target Resolution Date: 07/27/2022 Goal Status: Active Patient/caregiver will verbalize understanding of reason and process for debridement of necrotic tissue Date Initiated: 06/12/2022 Target Resolution Date: 07/27/2022 Goal Status: Active Interventions: Assess patient pain level pre-, during and post procedure and prior to discharge Provide education on necrotic tissue and debridement process Treatment Activities: Apply topical anesthetic as ordered : 06/12/2022 Notes: Wound/Skin Impairment Nursing Diagnoses: Impaired tissue integrity Knowledge deficit related to ulceration/compromised skin integrity Goals: Patient/caregiver will verbalize understanding of skin care regimen Date Initiated: 06/12/2022 Target Resolution Date: 07/27/2022 Goal Status: Active Interventions: Assess ulceration(s) every visit Treatment Activities: Skin care regimen initiated : 06/12/2022 Topical wound management initiated : 06/12/2022 Notes: Electronic Signature(s) Signed: 06/27/2022 4:53:16 PM By: Adline Peals Entered By: Adline Peals on 06/27/2022 11:28:52 -------------------------------------------------------------------------------- Pain Assessment Details Patient Name: Date of Service:  [name] [facility] [name] 06/27/2022 10:45 A M Medical Record Number: [id number] Patient Account Number: [id number] Date of Birth/Sex: Treating  RN: October 29, 1940 (82 y.o. [name] Primary Care Kary Colaizzi: Jenne Pane, [facility] Other Clinician: Referring Gabbi Whetstone: Treating Marquan Vokes/Extender: Devoria Albe, [facility] Weeks in Treatment: 2 Active Problems Location of Pain Severity and Description of Pain Patient Has Paino No Site Locations Rate the pain. KELLEA, POTTHOFF ([id number]) [id number] Page 8 of 13 Rate the pain. Current Pain Level: 0 Pain Management and Medication Current Pain Management: Electronic Signature(s) Signed: 06/27/2022 4:53:16 PM By: Adline Peals Entered By: Adline Peals on 06/27/2022 10:25:31 -------------------------------------------------------------------------------- Patient/Caregiver Education Details Patient Name: Date of Service: Danton Clap, [facility] [name] 3/6/2024andnbsp10:45 A M Medical Record Number: [id number] Patient Account Number: [id number] Date of Birth/Gender: Treating RN: 06/03/1940 (82 y.o. [name] Primary Care Physician: Jenne Pane, [facility] Other Clinician: Referring Physician: Treating Physician/Extender: Devoria Albe, [facility] Weeks in Treatment: 2 Education Assessment Education Provided To: Patient Education Topics Provided Wound/Skin Impairment: Methods: Explain/Verbal Responses: Reinforcements needed, State content correctly Electronic Signature(s) Signed: 06/27/2022 4:53:16 PM By: Adline Peals Entered By: Adline Peals on 06/27/2022 11:29:02 -------------------------------------------------------------------------------- Wound Assessment Details Patient Name: Date of Service: Danton Clap [facility] [name] 06/27/2022 10:45 A M Medical Record Number: [id number] Patient Account Number: [id number] Date of Birth/Sex: Treating RN: 1940-06-02 (82 y.o. [name] Primary Care Kirby Cortese: Jenne Pane, [facility] Other Clinician: Referring Cerise Lieber: Treating Taytem Ghattas/Extender: Fredirick Maudlin SHO KES, [facility] Weeks in  Treatment: 2 Wound Status Wound Number: 1 Primary Etiology: Abrasion Wound Location: Left, Dorsal T Great oe Wound Status: Open Wounding Event: Footwear Injury Comorbid History: Anemia, Angina, Hypertension, Vasculitis, Osteoarthritis Date Acquired: 05/24/2021 Weeks Of Treatment: 2 Clustered Wound: No [place], [place] ([id number]) [phone].pdf Page 9 of 13 Wound Measurements Length: (cm) 0.5 Width: (cm) 0.7 Depth: (cm) 0.1 Area: (cm) 0.275 Volume: (cm) 0.027 % Reduction in Area: 37.5% % Reduction in Volume: 38.6% Epithelialization: None Tunneling: No Undermining: No Wound Description Classification: 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 Support Structures Wound Margin: Distinct, outline attached Exudate Amount: Medium Exudate Type: Serosanguineous Exudate Color: red, brown Foul Odor After Cleansing: No Slough/Fibrino Yes Wound Bed Granulation Amount: Small (1-33%) Exposed Structure Granulation Quality: Red Fascia Exposed: No Necrotic Amount: Large (67-100%) Fat Layer (Subcutaneous Tissue) Exposed: Yes Necrotic Quality: Adherent Slough Tendon Exposed: No Muscle Exposed: No Joint Exposed: No Bone Exposed: No Periwound Skin Texture Texture Color No Abnormalities Noted: Yes No Abnormalities Noted: No Rubor: Yes Moisture No Abnormalities Noted: No Temperature / Pain Dry / Scaly: Yes Temperature: No Abnormality Maceration: Yes Treatment Notes Wound #1 (Toe Great) Wound Laterality: Dorsal, Left Cleanser Soap and Water Discharge Instruction: May shower and wash wound with dial antibacterial soap and water prior to dressing change. Wound Cleanser Discharge Instruction: Cleanse the wound with wound cleanser prior to applying a clean dressing using gauze sponges, not tissue or cotton balls. Peri-Wound Care Topical Primary Dressing Sorbalgon AG Dressing 2x2 (in/in) Discharge Instruction: Apply to wound bed as instructed Secondary Dressing Woven  Gauze Sponge, Non-Sterile 4x4 in Discharge Instruction: Apply over primary dressing as directed. Secured With Conforming Stretch Gauze Bandage Roll, Sterile 4x75 (in/in) Discharge Instruction: Secure with stretch gauze as directed. Transpore Surgical Tape, 2x10 (in/yd) Discharge Instruction: Secure dressing with tape as directed. Compression Wrap Compression Stockings Add-Ons Electronic Signature(s) Signed: 06/27/2022 4:53:16 PM By: Adline Peals Entered By: Adline Peals on 06/27/2022 10:39:43 Goren, Joselle ([id number] Page 10 of 13 -------------------------------------------------------------------------------- Wound Assessment Details Patient Name: Date of Service:  [name] [facility] [name] 06/27/2022 10:45 A M Medical Record Number: [id number] Patient Account Number: [id number] Date of Birth/Sex: Treating RN: [birth year] (82 y.o. [name] Primary Care Bracken Moffa: Jenne Pane, [facility] Other Clinician: Referring Rucha Wissinger: Treating Adrieana Fennelly/Extender: Fredirick Maudlin SHO Carlynn Spry, [facility] Weeks in Treatment: 2 Wound Status Wound Number: 2 Primary Etiology: Abrasion Wound Location: Left, Lateral T Great oe Wound Status: Open Wounding Event: Shear/Friction Comorbid History: Anemia, Angina, Hypertension, Vasculitis, Osteoarthritis Date Acquired: 05/24/2021 Weeks Of Treatment: 2 Clustered Wound: No Wound Measurements Length: (cm) 1 Width: (cm) 1.2 Depth: (cm) 0.1 Area: (cm) 0.942 Volume: (cm) 0.094 % Reduction in Area: 57.9% % Reduction in Volume: 58% Epithelialization: None Wound Description Classification: 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 Support Structures Wound Margin: Distinct, outline attached Exudate Amount: Medium Exudate Type: Serosanguineous Exudate Color: red, brown Foul Odor After Cleansing: No Slough/Fibrino Yes Wound Bed Granulation Amount: Small (1-33%) Exposed Structure Granulation Quality: Red Fascia Exposed:  No Necrotic Amount: Large (67-100%) Fat Layer (Subcutaneous Tissue) Exposed: Yes Necrotic Quality: Adherent Slough Tendon Exposed: No Muscle Exposed: No Joint Exposed: No Bone Exposed: No Periwound Skin Texture Texture Color No Abnormalities Noted: Yes No Abnormalities Noted: No Rubor: Yes Moisture No Abnormalities Noted: No Temperature / Pain Maceration: Yes Temperature: No Abnormality Treatment Notes Wound #2 (Toe Great) Wound Laterality: Left, Lateral Cleanser Soap and Water Discharge Instruction: May shower and wash wound with dial antibacterial soap and water prior to dressing change. Wound Cleanser Discharge Instruction: Cleanse the wound with wound cleanser prior to applying a clean dressing using gauze sponges, not tissue or cotton balls. Peri-Wound Care Topical Primary Dressing Sorbalgon AG Dressing 2x2 (in/in) Discharge Instruction: Apply to wound bed as instructed Secondary Dressing Woven Gauze Sponge, Non-Sterile 4x4 in Discharge Instruction: Apply over primary dressing as directed. Secured With Conforming Stretch Gauze Bandage Roll, Sterile 4x75 (in/in) Discharge Instruction: Secure with stretch gauze as directed. Transpore Surgical Tape, 2x10 (in/yd) Yonts, Vonette ([id number]) [phone].pdf Page 11 of 13 Discharge Instruction: Secure dressing with tape as directed. Compression Wrap Compression Stockings Add-Ons Electronic Signature(s) Signed: 06/27/2022 4:53:16 PM By: Adline Peals Entered By: Adline Peals on 06/27/2022 10:39:51 -------------------------------------------------------------------------------- Wound Assessment Details Patient Name: Date of Service: Danton Clap [name] 06/27/2022 10:45 A M Medical Record Number: [id number] Patient Account Number: [id number] Date of Birth/Sex: Treating RN: 1940/09/08 (82 y.o. [name] Primary Care Satvik Parco: Jenne Pane, [facility] Other Clinician: Referring  Berkeley Veldman: Treating Tylor Gambrill/Extender: Fredirick Maudlin SHO Carlynn Spry, [facility] Weeks in Treatment: 2 Wound Status Wound Number: 3 Primary Etiology: Abrasion Wound Location: Left, Medial T Third oe Wound Status: Open Wounding Event: Shear/Friction Comorbid History: Anemia, Angina, Hypertension, Vasculitis, Osteoarthritis Date Acquired: 05/24/2021 Weeks Of Treatment: 2 Clustered Wound: No Wound Measurements Length: (cm) 0.5 Width: (cm) 1 Depth: (cm) 0.1 Area: (cm) 0.393 Volume: (cm) 0.039 % Reduction in Area: 28.5% % Reduction in Volume: 29.1% Epithelialization: None Wound Description Classification: 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 Support Structures Wound Margin: Distinct, outline attached Exudate Amount: Medium Exudate Type: Serosanguineous Exudate Color: red, brown Foul Odor After Cleansing: No Slough/Fibrino Yes Wound Bed Granulation Amount: Small (1-33%) Exposed Structure Granulation Quality: Red Fascia Exposed: No Necrotic Amount: Large (67-100%) Fat Layer (Subcutaneous Tissue) Exposed: Yes Necrotic Quality: Adherent Slough Tendon Exposed: No Muscle Exposed: No Joint Exposed: No Bone Exposed: No Periwound Skin Texture Texture Color No Abnormalities Noted: Yes No Abnormalities Noted: No Rubor: Yes Moisture No Abnormalities Noted: No Temperature / Pain Maceration: Yes Temperature: No Abnormality Treatment Notes Wound #3 (Toe Third) Wound Laterality: Left, Medial  Cleanser Soap and Water Discharge Instruction: May shower and wash wound with dial antibacterial soap and water prior to dressing change. Wound Cleanser Discharge Instruction: Cleanse the wound with wound cleanser prior to applying a clean dressing using gauze sponges, not tissue or cotton balls. Peri-Wound Care Neilton, Penndel ([id number]) [id number] Page 12 of 13 Topical Primary Dressing Sorbalgon AG Dressing 2x2 (in/in) Discharge Instruction: Apply to wound bed as  instructed Secondary Dressing Woven Gauze Sponge, Non-Sterile 4x4 in Discharge Instruction: Apply over primary dressing as directed. Secured With Conforming Stretch Gauze Bandage Roll, Sterile 4x75 (in/in) Discharge Instruction: Secure with stretch gauze as directed. Transpore Surgical Tape, 2x10 (in/yd) Discharge Instruction: Secure dressing with tape as directed. Compression Wrap Compression Stockings Add-Ons Electronic Signature(s) Signed: 06/27/2022 4:53:16 PM By: Adline Peals Entered By: Adline Peals on 06/27/2022 10:39:55 -------------------------------------------------------------------------------- Wound Assessment Details Patient Name: Date of Service: Danton Clap [name] 06/27/2022 10:45 A M Medical Record Number: [id number] Patient Account Number: [id number] Date of Birth/Sex: Treating RN: [birth year] (82 y.o. [name] Primary Care Farren Landa: Jenne Pane, [facility] Other Clinician: Referring Sara Keys: Treating Brittanie Dosanjh/Extender: Fredirick Maudlin SHO Carlynn Spry, [facility] Weeks in Treatment: 2 Wound Status Wound Number: 4 Primary Etiology: Skin Tear Wound Location: Left, Plantar Foot Wound Status: Open Wounding Event: Skin Tear/Laceration Comorbid History: Anemia, Angina, Hypertension, Vasculitis, Osteoarthritis Date Acquired: 06/20/2022 Weeks Of Treatment: 1 Clustered Wound: No Wound Measurements Length: (cm) 0.1 Width: (cm) 0.1 Depth: (cm) 0.1 Area: (cm) 0.008 Volume: (cm) 0.001 % Reduction in Area: 74.2% % Reduction in Volume: 66.7% Epithelialization: Large (67-100%) Tunneling: No Undermining: No Wound Description Classification: 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 Support Structures Wound Margin: Distinct, outline attached Exudate Amount: None Present Foul Odor After Cleansing: No Slough/Fibrino No Wound Bed Granulation Amount: None Present (0%) Exposed Structure Necrotic Amount: None Present (0%) Fascia Exposed: No Fat Layer (Subcutaneous Tissue)  Exposed: No Tendon Exposed: No Muscle Exposed: No Joint Exposed: No Bone Exposed: No Periwound Skin Texture Texture Color No Abnormalities Noted: Yes No Abnormalities Noted: No Rubor: Yes Moisture No Abnormalities Noted: Yes Temperature / Pain Temperature: No Abnormality Kendrick, Shanen ([id number].pdf Page 13 of 13 Treatment Notes Wound #4 (Foot) Wound Laterality: Plantar, Left Cleanser Soap and Water Discharge Instruction: May shower and wash wound with dial antibacterial soap and water prior to dressing change. Wound Cleanser Discharge Instruction: Cleanse the wound with wound cleanser prior to applying a clean dressing using gauze sponges, not tissue or cotton balls. Peri-Wound Care Topical Primary Dressing Sorbalgon AG Dressing 2x2 (in/in) Discharge Instruction: Apply to wound bed as instructed Secondary Dressing Woven Gauze Sponge, Non-Sterile 4x4 in Discharge Instruction: Apply over primary dressing as directed. Secured With Conforming Stretch Gauze Bandage Roll, Sterile 4x75 (in/in) Discharge Instruction: Secure with stretch gauze as directed. Transpore Surgical Tape, 2x10 (in/yd) Discharge Instruction: Secure dressing with tape as directed. Compression Wrap Compression Stockings Add-Ons Electronic Signature(s) Signed: 06/27/2022 4:53:16 PM By: Adline Peals Entered By: Adline Peals on 06/27/2022 10:40:12 -------------------------------------------------------------------------------- Vitals Details Patient Name: Date of Service: [name], [facility] [name] 06/27/2022 10:45 A M Medical Record Number: [id number] Patient Account Number: [id number] Date of Birth/Sex: Treating RN: 1940/06/08 (82 y.o. [name] Primary Care Geoffrey Hynes: Jenne Pane, [facility] Other Clinician: Referring Chisom Muntean: Treating Delorise Hunkele/Extender: Fredirick Maudlin SHO Carlynn Spry, [facility] Weeks in Treatment: 2 Vital Signs Time Taken: 10:22 Temperature (F):  97.6 Height (in): 67 Pulse (bpm): 42 Weight (lbs): 153 Respiratory Rate (breaths/min): 18 Body Mass Index (BMI): 24 Blood Pressure (mmHg): 78/54 Reference Range: 80 - 120 mg /  dl Notes BP and HR readings too low, advised to go to ED after visit. Water provided to patient. Electronic Signature(s) Signed: 06/27/2022 4:53:16 PM By: Adline Peals Entered By: Adline Peals on 06/27/2022 10:37:44

## 2022-06-29 NOTE — Progress Notes (Signed)
PROGRESS NOTE    [name]  [id number] DOB: Nov 20, 1940 DOA: 06/27/2022 PCP: Lubertha Sayres, FNP   Brief Narrative: 82 year old with past medical history significant for anemia, A-fib, chest pain syndrome, GERD, hyperlipidemia, hypertension, hypothyroidism, history of CVA, history of digital vasculitis, unspecified facial abnormality, who presented from wound care center due to hypotension and bradycardia.  Patient reports feeling weak and having occasionally lightheaded.  Evaluation in the ED she was found to have a blood pressure 94/50, heart rate in the 70s.  She received IV bolus.  Lactic acid 2.2 overnight increased to 6.  Chest x-ray showed resolution of prior heterogeneous opacities and prior bilateral pleural effusion.   Assessment & Plan:   Principal Problem:   AKI (acute kidney injury) ([place]) Active Problems:   Persistent atrial fibrillation (HCC)   Hypertension   Hyperlipidemia   Hypothyroidism   Normocytic anemia   1-Lactic Acidosis, Hypotension; Concern for infection left foot. She has mild redness.  Repeated lactic acid. Down to 1 from 6.  Received IV fluids.  Continue to hold Cardizem.  Started on IV antibiotics to cover foot infection.  BP improved.   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  Continue with wound care.  X ray ordered.  No definite evidence of  osteomyelitis.  Continue with IV antibiotics  Bradycardia: Heart rate increasing.  Will continue to hold Cardizem.  Will resume lower dose metoprolol.  Hyponatremia; Received IV fluids.  Monitor.   AKI: in setting hypotension.  Improved with Fluids. NSL  Continue to hold diuretics and ramipril.   Persistent A-fib: Holding metoprolol and Cardizem due to bradycardia and hypotension.   Hypertension:  Hold  Cardizem due to hypotension.  Low dose metoprolol resume.   Hypothyroidism: Continue with synthroid.    Normocytic anemia: Monitor hb.     Estimated body mass index is 19.03 kg/m as calculated from the following:   Height as of this encounter: '5\' 7"'$  (1.702 m).   Weight as of this encounter: 55.1 kg.   DVT prophylaxis: eliquis Code Status: Wishes to be full code Family Communication: son over phone 3/07 Disposition Plan:  Status is: Observation The patient remains OBS appropriate and will d/c before 2 midnights.    Consultants:  None  Procedures:    Antimicrobials:    Subjective: She does not have any new complaint, she is feeling better.  Objective: Vitals:   06/28/22 2054 06/29/22 0506 06/29/22 1208 06/29/22 1305  BP: (!) 114/95 123/80 127/82 121/69  Pulse: 100 93  79  Resp:  16  17  Temp: 97.7 F (36.5 C) 98.3 F (36.8 C)  (!) 97.4 F (36.3 C)  TempSrc: Oral Oral  Oral  SpO2: 96% 98%  (!) 87%  Weight:      Height:        Intake/Output Summary (Last 24 hours) at 06/29/2022 1408 Last data filed at 06/29/2022 1215 Gross per 24 hour  Intake 975.05 ml  Output 2250 ml  Net -1274.95 ml    Filed Weights   06/27/22 1127 06/27/22 1835  Weight: 59 kg 55.1 kg    Examination:  General exam: NAD Respiratory system: CTA Cardiovascular system: S 1, S 2 RRR  Gastrointestinal system: BS present, soft, nt Central nervous system: Alert Extremities: No edema Skin: Left foot with redness, deformity toes.     Data Reviewed: I have personally reviewed following labs and imaging studies  CBC: Recent Labs  Lab  06/27/22 1159 06/28/22 0431  WBC 8.9 7.4  NEUTROABS 6.0  --   HGB 10.0* 9.0*  HCT 32.7* 30.3*  MCV 86.1 87.6  PLT 264 [id number]    Basic Metabolic Panel: Recent Labs  Lab 06/27/22 1159 06/28/22 0431 06/29/22 0444  NA 130* 138  --   K 4.3 3.8  --   CL 98 109  --   CO2 21* 22  --   GLUCOSE 98 90  --   BUN 53* 42*  --   CREATININE 1.55* 1.19* 0.87  CALCIUM 8.7* 8.2*  --     GFR: Estimated Creatinine Clearance: 44.9 mL/min (by C-G formula  based on SCr of 0.87 mg/dL). Liver Function Tests: Recent Labs  Lab 06/27/22 1159 06/28/22 0431  AST 18 14*  ALT 10 9  ALKPHOS 88 71  BILITOT 0.5 0.6  PROT 7.4 5.9*  ALBUMIN 3.9 3.0*    No results for input(s): "LIPASE", "AMYLASE" in the last 168 hours. No results for input(s): "AMMONIA" in the last 168 hours. Coagulation Profile: Recent Labs  Lab 06/27/22 1159  INR 1.7*    Cardiac Enzymes: No results for input(s): "CKTOTAL", "CKMB", "CKMBINDEX", "TROPONINI" in the last 168 hours. BNP (last 3 results) No results for input(s): "PROBNP" in the last 8760 hours. HbA1C: No results for input(s): "HGBA1C" in the last 72 hours. CBG: No results for input(s): "GLUCAP" in the last 168 hours. Lipid Profile: No results for input(s): "CHOL", "HDL", "LDLCALC", "TRIG", "CHOLHDL", "LDLDIRECT" in the last 72 hours. Thyroid Function Tests: No results for input(s): "TSH", "T4TOTAL", "FREET4", "T3FREE", "THYROIDAB" in the last 72 hours. Anemia Panel: No results for input(s): "VITAMINB12", "FOLATE", "FERRITIN", "TIBC", "IRON", "RETICCTPCT" in the last 72 hours. Sepsis Labs: Recent Labs  Lab 06/27/22 1159 06/27/22 1430 06/27/22 1802 06/28/22 0950  PROCALCITON  --   --   --  <0.10  LATICACIDVEN 2.2* 2.5* 6.0* 1.5     Recent Results (from the past 240 hour(s))  Culture, blood (routine x 2)     Status: None (Preliminary result)   Collection Time: 06/27/22 11:59 AM   Specimen: BLOOD RIGHT HAND  Result Value Ref Range Status   Specimen Description   Final    BLOOD RIGHT HAND Performed at [facility], [place] [address] [place], [place] [address]    Special Requests   Final    BOTTLES DRAWN AEROBIC AND ANAEROBIC Blood Culture adequate volume Performed at [facility] [address] [place], [place] [address]    Culture   Final    NO GROWTH 2 DAYS Performed at [facility] [address], [place], [place] [address]    Report Status  PENDING  Incomplete  Culture, blood (routine x 2)     Status: None (Preliminary result)   Collection Time: 06/27/22 12:05 PM   Specimen: BLOOD RIGHT ARM  Result Value Ref Range Status   Specimen Description   Final    BLOOD RIGHT ARM Performed at [place] [address], [place], [place] [address]    Special Requests   Final    BOTTLES DRAWN AEROBIC AND ANAEROBIC Blood Culture adequate volume Performed at [facility] [address], [place],  [address]    Culture   Final    NO GROWTH 2 DAYS Performed at [facility] [address] [place],  [address]    Report Status PENDING  Incomplete  MRSA Next Gen by PCR, Nasal  Status: None   Collection Time: 06/27/22  9:22 PM   Specimen: Nasal Mucosa; Nasal Swab  Result Value Ref Range Status   MRSA by PCR Next Gen NOT DETECTED NOT DETECTED Final    Comment: (NOTE) The GeneXpert MRSA Assay (FDA approved for NASAL specimens only), is one component of a comprehensive MRSA colonization surveillance program. It is not intended to diagnose MRSA infection nor to guide or monitor treatment for MRSA infections. Test performance is not FDA approved in patients less than 25 years old. Performed at [facility], [place] [address] [place], [place] [address]          Radiology Studies: DG Foot 2 Views Left  Result Date: 06/28/2022 CLINICAL DATA:  Wound cellulitis EXAM: LEFT FOOT - 2 VIEW COMPARISON:  Radiograph 12/05/2021 FINDINGS: Postsurgical changes of prior hindfoot arthrodesis with unchanged fixation screws. There is bony fusion in the midfoot with flatfoot deformity. There is an unchanged displaced anchor overlying the soft tissue of the anterior/dorsal aspect of the ankle joint. Diffuse osteopenia. No evidence of acute fracture. Chronic periosteal change along the fifth metatarsal shaft. Lateral dislocation of the fourth toe PIP joint. Joint deformity with  medial angulation and bony fusion of the third toe PIP joint. Multiple toe flexion deformities. Chronic deformity of the great toe proximal phalanx. There is no definite new bone erosion or frank bony destruction radiographically. Plantar calcaneal spur. Small Haglund deformity. Diffuse soft tissue swelling of the foot and ankle. IMPRESSION: Soft tissue swelling of the foot and ankle. Diffuse osteopenia. No definite radiographic evidence of osteomyelitis. Electronically Signed   By: Maurine Simmering M.D.   On: 06/28/2022 10:46        Scheduled Meds:  apixaban  2.5 mg Oral BID   aspirin EC  81 mg Oral Daily   atorvastatin  20 mg Oral Daily   escitalopram  2.5 mg Oral QHS   feeding supplement  237 mL Oral TID BM   ferrous sulfate  325 mg Oral TID WC   levothyroxine  112 mcg Oral QAC breakfast   metoprolol tartrate  12.5 mg Oral BID   mirtazapine  15 mg Oral QHS   pantoprazole  40 mg Oral Daily   Continuous Infusions:  sodium chloride 75 mL/hr at 06/29/22 1357   cefTRIAXone (ROCEPHIN)  IV 2 g (06/29/22 0850)   metronidazole 500 mg (06/29/22 0849)   vancomycin 750 mg (06/29/22 1042)     LOS: 1 day    Time spent: 35 minutes    Makinzy Cleere A Chia Rock, MD [facility]   If 7PM-7AM, please contact night-coverage [email or web address]  06/29/2022, 2:08 PM

## 2022-06-29 NOTE — Progress Notes (Addendum)
Pharmacy Antibiotic Note  [name] is a 82 y.o. female admitted on 06/27/2022 with hypotension. Lactic acid trending up on admission. Patient remains afebrile, WBC normal. SCr 1.55 on admission, has improved (baseline < 1.0). Pharmacy has been consulted for vancomycin dosing for sepsis.  Plan: -Change vancomycin to 750 mg IV q24h -Rocephin/flagyl per MD -Continue to follow renal function, cultures and clinical progress for dose adjustments and de-escalation as indicated  Height: '5\' 7"'$  (170.2 cm) Weight: 55.1 kg (121 lb 7.6 oz) IBW/kg (Calculated) : 61.6  Temp (24hrs), Avg:98 F (36.7 C), Min:97.7 F (36.5 C), Max:98.3 F (36.8 C)  Recent Labs  Lab 06/27/22 1159 06/27/22 1430 06/27/22 1802 06/28/22 0431 06/28/22 0950 06/29/22 0444  WBC 8.9  --   --  7.4  --   --   CREATININE 1.55*  --   --  1.19*  --  0.87  LATICACIDVEN 2.2* 2.5* 6.0*  --  1.5  --      Estimated Creatinine Clearance: 44.9 mL/min (by C-G formula based on SCr of 0.87 mg/dL).    Allergies  Allergen Reactions   Sulfa Antibiotics Other (See Comments)    Reaction     Antimicrobials this admission: Rocephin 3/7 >> Flagyl 3/7 >> Vancomycin 3/7 >>  Dose adjustments this admission:  3/8 Vanc 1000 mg q36h >> 750 mg q24h  Microbiology results: 3/6 BCx: ngtd 3/6 MRSA PCR: negative  Thank you for allowing pharmacy to be a part of this patient's care.  Tawnya Crook, PharmD, BCPS Clinical Pharmacist 06/29/2022 7:48 AM

## 2022-06-29 NOTE — Evaluation (Signed)
Physical Therapy Evaluation Patient Details Name: [name] MRN: [id number] DOB: May 16, 1940 Today's Date: 06/29/2022  History of Present Illness  Pt is an 82 y.o. female who presented from wound care center due to hypotension and bradycardia.  Patient reports feeling weak and having occasionally lightheaded. 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   Clinical Impression  Pt is an 82 y.o. female with above HPI resulting in the deficits listed below (see PT Problem List). Per chart review, pt resides at [facility]. Pt a questionable historian, no family or caregiver present to confirm PLOF/baseline cognitive status. Pt reports that she is modified independent with use of SPC/RW and is independent with bathing/dressing.  Pt performed sit to stand transfers with MOD A for power up to stand with cues for hand placement. Pt performed step pivot to recliner with MIN A- goal to ambulate,  but limited by L foot pain so transferred to recliner only. Recommend return to ALF with HHPT if staff able to provide required level of assist, if not recommend  short term rehab stay at SNF to progress toward PLOF prior to return to ALF. Pt will benefit from skilled PT to maximize functional mobility to increase independence.         Recommendations for follow up therapy are one component of a multi-disciplinary discharge planning process, led by the attending physician.  Recommendations may be updated based on patient status, additional functional criteria and insurance authorization.  Follow Up Recommendations Home health PT (Recommend return to ALF with HHPT if staff able to provide required level of assist, if not recommend SNF prior to return to ALF.)      Assistance Recommended at Discharge Frequent or constant Supervision/Assistance  Patient  can return home with the following  A lot of help with walking and/or transfers;A little help with bathing/dressing/bathroom;Help with stairs or ramp for entrance;Direct supervision/assist for financial management;Direct supervision/assist for medications management;Assistance with cooking/housework    Equipment Recommendations None recommended by PT (pt reports she has RW)  Recommendations for Other Services       Functional Status Assessment Patient has had a recent decline in their functional status and demonstrates the ability to make significant improvements in function in a reasonable and predictable amount of time.     Precautions / Restrictions Precautions Precautions: Fall Restrictions Weight Bearing Restrictions: No      Mobility  Bed Mobility Overal bed mobility: Needs Assistance Bed Mobility: Supine to Sit     Supine to sit: Supervision, HOB elevated          Transfers Overall transfer level: Needs assistance Equipment used: Rolling walker (2 wheels) Transfers: Sit to/from Stand, Bed to chair/wheelchair/BSC Sit to Stand: Mod assist   Step pivot transfers: Min assist       General transfer comment: sit to stand x1 from EOB and x1 from recliner. Increased time for achieving full stand and MOD A to power up due to L foot pain when weightbearing. Cues for use of B UEs on armrest from recliner to assist. Planned for ambulating, but pt limited by pain once standing and performed ,multiple steps for step pivot over to chair.    Ambulation/Gait                  Science writer  Modified Rankin (Stroke Patients Only)       Balance Overall balance assessment: Needs assistance Sitting-balance support: Feet supported Sitting balance-Leahy Scale: Good     Standing balance support: Bilateral upper extremity supported, During functional activity, Reliant on assistive device for balance Standing balance-Leahy Scale: Poor                                Pertinent Vitals/Pain Pain Assessment Pain Assessment: Faces Faces Pain Scale: Hurts even more Pain Location: Lt foot Pain Descriptors / Indicators: Grimacing, Sore Pain Intervention(s): Limited activity within patient's tolerance, Monitored during session, Repositioned    Home Living Family/patient expects to be discharged to:: Assisted living                 Home Equipment: Conservation officer, nature (2 wheels);Cane - single point Additional Comments: Per chart review, pt resides at [facility]    Prior Function Prior Level of Function : Independent/Modified Independent             Mobility Comments: reports using SPC mostly, but RW as needed. States independent with mobility at baseline ADLs Comments: reports able to bathe/dress self. Assist from staff with meals     Hand Dominance        Extremity/Trunk Assessment   Upper Extremity Assessment Upper Extremity Assessment: Overall WFL for tasks assessed    Lower Extremity Assessment Lower Extremity Assessment: Generalized weakness    Cervical / Trunk Assessment Cervical / Trunk Assessment: Normal  Communication   Communication: No difficulties  Cognition Arousal/Alertness: Awake/alert Behavior During Therapy: WFL for tasks assessed/performed Overall Cognitive Status: No family/caregiver present to determine baseline cognitive functioning                                 General Comments: pleasant, noted short term memory impairments , not oriented to time. place., siutation. Report she has diffuclty recalling dates at baseline. Unable to repeat to therapist she is in hospital or reason why despite multilpe reorientation attempts throughout session.        General Comments      Exercises     Assessment/Plan    PT Assessment Patient needs continued PT services  PT Problem List Decreased strength;Decreased activity tolerance;Decreased  balance;Decreased mobility;Decreased cognition;Pain       PT Treatment Interventions DME instruction;Gait training;Functional mobility training;Therapeutic activities;Therapeutic exercise;Balance training;Patient/family education    PT Goals (Current goals can be found in the Care Plan section)  Acute Rehab PT Goals Patient Stated Goal: Have less pain to be able to move better PT Goal Formulation: With patient Time For Goal Achievement: 07/13/22 Potential to Achieve Goals: Good    Frequency Min 3X/week     Co-evaluation               AM-PAC PT "6 Clicks" Mobility  Outcome Measure Help needed turning from your back to your side while in a flat bed without using bedrails?: None Help needed moving from lying on your back to sitting on the side of a flat bed without using bedrails?: A Little Help needed moving to and from a bed to a chair (including a wheelchair)?: A Lot Help needed standing up from a chair using your arms (e.g., wheelchair or bedside chair)?: A Lot Help needed to walk in hospital room?: A Little Help needed climbing 3-5 steps with a railing? :  A Lot 6 Click Score: 16    End of Session Equipment Utilized During Treatment: Gait belt Activity Tolerance: Patient limited by pain Patient left: in chair;with call bell/phone within reach;with chair alarm set;with nursing/sitter in room Nurse Communication: Mobility status PT Visit Diagnosis: Pain;Difficulty in walking, not elsewhere classified (R26.2);Muscle weakness (generalized) (M62.81) Pain - Right/Left: Left Pain - part of body: Ankle and joints of foot    Time: 1330-1350 PT Time Calculation (min) (ACUTE ONLY): 20 min   Charges:   PT Evaluation $PT Eval Low Complexity: 1 Low          Festus Barren PT, DPT  Acute Rehabilitation Services  Office [phone]  06/29/2022, 2:40 PM

## 2022-06-30 DIAGNOSIS — N179 Acute kidney failure, unspecified: Secondary | ICD-10-CM | POA: Diagnosis not present

## 2022-06-30 LAB — BASIC METABOLIC PANEL
Anion gap: 7 (ref 5–15)
BUN: 24 mg/dL — ABNORMAL HIGH (ref 8–23)
CO2: 22 mmol/L (ref 22–32)
Calcium: 8.2 mg/dL — ABNORMAL LOW (ref 8.9–10.3)
Chloride: 107 mmol/L (ref 98–111)
Creatinine, Ser: 0.8 mg/dL (ref 0.44–1.00)
GFR, Estimated: >60 mL/min (ref >60)
Glucose, Bld: 127 mg/dL — ABNORMAL HIGH (ref 70–99)
Potassium: 3.8 mmol/L (ref 3.5–5.1)
Sodium: 136 mmol/L (ref 135–145)

## 2022-06-30 MED ORDER — DOXYCYCLINE HYCLATE 100 MG PO CAPS: 100.0000 mg | ORAL_CAPSULE | Freq: Two times a day (BID) | ORAL | 0 refills | Status: DC

## 2022-06-30 MED ORDER — DOXYCYCLINE HYCLATE 100 MG PO TBEC: 100.0000 mg | DELAYED_RELEASE_TABLET | Freq: Two times a day (BID) | ORAL | 0 refills | Status: DC

## 2022-06-30 MED ORDER — TRAMADOL HCL 50 MG PO TABS
25.0000 mg | ORAL_TABLET | Freq: Four times a day (QID) | ORAL | Status: DC | PRN
  Administered 2022-07-01 – 2022-07-03 (×3): 25 mg via ORAL
  Filled 2022-06-30 (×3): qty 1

## 2022-06-30 MED ORDER — METOPROLOL TARTRATE 25 MG PO TABS: 25.0000 mg | ORAL_TABLET | Freq: Two times a day (BID) | ORAL | 0 refills | Status: DC

## 2022-06-30 MED ORDER — APIXABAN 2.5 MG PO TABS: 2.5000 mg | ORAL_TABLET | Freq: Two times a day (BID) | ORAL | 0 refills | Status: DC

## 2022-06-30 MED ORDER — DOXYCYCLINE HYCLATE 50 MG PO CAPS: 100.0000 mg | ORAL_CAPSULE | Freq: Two times a day (BID) | ORAL | 0 refills | Status: DC

## 2022-06-30 MED ORDER — METOPROLOL TARTRATE 25 MG PO TABS
25.0000 mg | ORAL_TABLET | Freq: Two times a day (BID) | ORAL | Status: DC
  Administered 2022-06-30 – 2022-07-02 (×5): 25 mg via ORAL
  Filled 2022-06-30 (×5): qty 1

## 2022-06-30 MED ORDER — AMOXICILLIN-POT CLAVULANATE 875-125 MG PO TABS: 1.0000 | ORAL_TABLET | Freq: Two times a day (BID) | ORAL | 0 refills | Status: DC

## 2022-06-30 NOTE — Progress Notes (Signed)
OT Cancellation Note  Patient Details Name: [name] MRN: [id number] DOB: 1940-08-21   Cancelled Treatment:    Reason Eval/Treat Not Completed: Other (comment). Patient discharging back to facility today. Patient appears to be near her baseline - she needs assistance with ADLs and transfers at baseline. Will defer to facility for need for OT evaluation.   Lenward Chancellor 06/30/2022, 12:47 PM

## 2022-06-30 NOTE — TOC Progression Note (Signed)
Transition of Care [facility]) - Progression Note    Patient Details  Name: Basilisa Bozarth MRN: [id number] Date of Birth: [birth year]  Transition of Care [facility]) CM/SW Contact  Henrietta Dine, RN Phone Number: 06/30/2022, 5:04 PM  Clinical Narrative:    Called facility x 2 to see if able to admit today; no answer.   Expected Discharge Plan:  ([place]) Barriers to Discharge: Continued Medical Work up  Expected Discharge Plan and Services   Discharge Planning Services: CM Consult   Living arrangements for the past 2 months: [place] Expected Discharge Date: 06/30/22                                     Social Determinants of Health (SDOH) Interventions SDOH Screenings   Food Insecurity: No Food Insecurity (06/27/2022)  Housing: Low Risk  (06/27/2022)  Transportation Needs: No Transportation Needs (06/27/2022)  Utilities: Not At Risk (06/27/2022)  Tobacco Use: Medium Risk (06/27/2022)    Readmission Risk Interventions    08/11/2021   10:29 AM 08/09/2021    2:34 PM  Readmission Risk Prevention Plan  Transportation Screening Complete Complete  PCP or Specialist Appt within 3-5 Days Complete   HRI or [place] Complete Complete  Social Work Consult for [place] Planning/Counseling Complete Complete  Palliative Care Screening Not Applicable Not Applicable  Medication Review Press photographer) Complete Complete

## 2022-06-30 NOTE — Discharge Summary (Addendum)
Physician Discharge Summary   Patient: [name] MRN: [id number] DOB: 12/25/1940  Admit date:     06/27/2022  Discharge date: 06/30/22  Discharge Physician: Elmarie Shiley   PCP: Lubertha Sayres, FNP   Recommendations at discharge:   Wound care to left foot, great toe and 2nd and 3rd digits:  Cleanse with soap and water, rinse and dry thoroughly. Place folded gauze between toes to keep dry. Cover wounds with size appropriate pieces of silver hydrofiber (Aquacel Ag+ Advantage, Kellie Simmering # [id number]).  Secure with Kerlix roll gauze/paper tape. Change daily. Place foot into Prevalon boot.      2-Lasix As Needed.     Discharge Diagnoses: Principal Problem:   AKI (acute kidney injury) ([facility] [facility]) Active Problems:   Persistent atrial fibrillation (HCC)   Hypertension   Hyperlipidemia   Hypothyroidism   Normocytic anemia  Resolved Problems:   * No resolved hospital problems. *  Hospital Course: 82 year old with past medical history significant for anemia, A-fib, chest pain syndrome, GERD, hyperlipidemia, hypertension, hypothyroidism, history of CVA, history of digital vasculitis, unspecified facial abnormality, who presented from wound care center due to hypotension and bradycardia.  Patient reports feeling weak and having occasionally lightheaded.   Evaluation in the ED she was found to have a blood pressure 94/50, heart rate in the 70s.  She received IV bolus.  Lactic acid 2.2 overnight increased to 6.  Chest x-ray showed resolution of prior heterogeneous opacities and prior bilateral pleural effusion.   Assessment and Plan: 1-Lactic Acidosis, Hypotension; Concern for infection left foot. She has mild redness.  Repeated lactic acid. Down to 1 from 6.  Received IV fluids.  Continue to hold Cardizem.  Started on IV antibiotics to cover foot infection.  BP improved.    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  Continue with wound care.  X ray ordered.  No definite evidence of  osteomyelitis.  Treated with IV antibiotics Vancomycin, ceftriaxone and Flagyl.  Discharge on Augmentin and Doxy for 5 days.  Continue with wound care.    Bradycardia: Heart rate increasing.  Will continue to hold Cardizem.  Will resume lower dose metoprolol.  tolerating lower dose metoprolol.   Hyponatremia; Received IV fluids.  Monitor.    AKI: in setting hypotension.  Improved with Fluids. NSL  Continue to hold diuretics and ramipril.     Persistent A-fib: Holding metoprolol and Cardizem due to bradycardia and hypotension.    Hypertension:  Hold  Cardizem due to hypotension.  Low dose metoprolol resume.    Hypothyroidism: Continue with synthroid.    Normocytic anemia:        Consultants: None Procedures performed: None Disposition: Assisted living Diet recommendation:  Discharge Diet Orders (From admission, onward)     Start     Ordered   06/30/22 0000  Diet - low sodium heart healthy        06/30/22 1017           Cardiac diet DISCHARGE MEDICATION: Allergies as of 06/30/2022       Reactions   Sulfa Antibiotics Other (See Comments)   Reaction         Medication List     STOP taking these medications    cephALEXin 500 MG capsule Commonly known as: Keflex   diltiazem 240 MG 24 hr capsule Commonly known as: CARDIZEM CD   docusate sodium 100 MG capsule Commonly known as: Colace  furosemide 20 MG tablet Commonly known as: LASIX   ramipril 2.5 MG capsule Commonly known as: ALTACE       TAKE these medications    amoxicillin-clavulanate 875-125 MG tablet Commonly known as: AUGMENTIN Take 1 tablet by mouth 2 (two) times daily for 5 days.   apixaban 2.5 MG Tabs tablet Commonly known as: ELIQUIS Take 1 tablet (2.5 mg total) by mouth 2 (two) times daily. What changed:  medication strength how much to take   aspirin EC 81 MG tablet Take 81 mg by mouth  daily.   atorvastatin 20 MG tablet Commonly known as: LIPITOR Take 20 mg by mouth daily.   Biofreeze 4 % Gel Generic drug: Menthol (Topical Analgesic) Apply 1 application topically See admin instructions. Apply to right shoulder twice a day   calcium carbonate 500 MG chewable tablet Commonly known as: TUMS - dosed in mg elemental calcium Chew 1 tablet by mouth 2 (two) times daily with a meal.   clobetasol 0.05 % external solution Commonly known as: TEMOVATE Apply 1 Application topically every other day.   doxycycline 100 MG EC tablet Commonly known as: DORYX Take 1 tablet (100 mg total) by mouth 2 (two) times daily for 5 days.   escitalopram 5 MG tablet Commonly known as: LEXAPRO Take 2.5 mg by mouth at bedtime.   ferrous sulfate 325 (65 FE) MG tablet Take 325 mg by mouth 3 (three) times daily with meals.   levothyroxine 112 MCG tablet Commonly known as: SYNTHROID Take 112 mcg by mouth daily before breakfast.   metoprolol tartrate 25 MG tablet Commonly known as: LOPRESSOR Take 1 tablet (25 mg total) by mouth 2 (two) times daily. What changed:  medication strength how much to take   mirtazapine 15 MG tablet Commonly known as: REMERON Take 15 mg by mouth at bedtime.   mupirocin ointment 2 % Commonly known as: BACTROBAN Apply 1 Application topically daily.   omeprazole 20 MG capsule Commonly known as: PRILOSEC Take 20 mg by mouth daily.   polyethylene glycol 17 g packet Commonly known as: MIRALAX / GLYCOLAX Take 17 g by mouth daily as needed for mild constipation or moderate constipation.   promethazine 12.5 MG tablet Commonly known as: PHENERGAN Take 12.5 mg by mouth daily as needed for nausea or vomiting.   SYSTANE BALANCE OP Place 1 drop into both eyes 2 (two) times daily.   vitamin B-12 500 MCG tablet Commonly known as: CYANOCOBALAMIN Take 500 mcg by mouth daily.               Discharge Care Instructions  (From admission, onward)            Start     Ordered   06/30/22 0000  Discharge wound care:       Comments: See above   06/30/22 1017            Discharge Exam: Filed Weights   06/27/22 1127 06/27/22 1835  Weight: 59 kg 55.1 kg   General;  NAD Condition at discharge: stable  The results of significant diagnostics from this hospitalization (including imaging, microbiology, ancillary and laboratory) are listed below for reference.   Imaging Studies: DG Foot 2 Views Left  Result Date: 06/28/2022 CLINICAL DATA:  Wound cellulitis EXAM: LEFT FOOT - 2 VIEW COMPARISON:  Radiograph 12/05/2021 FINDINGS: Postsurgical changes of prior hindfoot arthrodesis with unchanged fixation screws. There is bony fusion in the midfoot with flatfoot deformity. There is an unchanged displaced anchor overlying the soft  tissue of the anterior/dorsal aspect of the ankle joint. Diffuse osteopenia. No evidence of acute fracture. Chronic periosteal change along the fifth metatarsal shaft. Lateral dislocation of the fourth toe PIP joint. Joint deformity with medial angulation and bony fusion of the third toe PIP joint. Multiple toe flexion deformities. Chronic deformity of the great toe proximal phalanx. There is no definite new bone erosion or frank bony destruction radiographically. Plantar calcaneal spur. Small Haglund deformity. Diffuse soft tissue swelling of the foot and ankle. IMPRESSION: Soft tissue swelling of the foot and ankle. Diffuse osteopenia. No definite radiographic evidence of osteomyelitis. Electronically Signed   By: Maurine Simmering M.D.   On: 06/28/2022 10:46   DG Chest Port 1 View  Result Date: 06/27/2022 CLINICAL DATA:  Shortness of breath.  Hypotension and bradycardia. EXAM: PORTABLE CHEST 1 VIEW COMPARISON:  Chest radiographs 08/15/2021, 08/07/2021, 07/07/2021 FINDINGS: Cardiac silhouette is again moderately enlarged. Moderate calcifications are again seen within the aortic arch. Resolution of the prior heterogeneous airspace  opacities within the right upper lobe and right lower lobe. Resolution of the prior bilateral pleural effusions. No pneumothorax. No acute skeletal abnormality. IMPRESSION: Resolution of the prior heterogeneous airspace opacities within the right upper lobe and right lower lobe. Resolution of the prior bilateral pleural effusions. Electronically Signed   By: Yvonne Kendall M.D.   On: 06/27/2022 12:53    Microbiology: Results for orders placed or performed during the hospital encounter of 06/27/22  Culture, blood (routine x 2)     Status: None (Preliminary result)   Collection Time: 06/27/22 11:59 AM   Specimen: BLOOD RIGHT HAND  Result Value Ref Range Status   Specimen Description   Final    BLOOD RIGHT HAND Performed at [facility] [address] [place], [place] [address]    Special Requests   Final    BOTTLES DRAWN AEROBIC AND ANAEROBIC Blood Culture adequate volume Performed at [facility] [address] [place], [place] [address]    Culture   Final    NO GROWTH 3 DAYS Performed at [facility], [place] [address], [place], [place] [address]    Report Status PENDING  Incomplete  Culture, blood (routine x 2)     Status: None (Preliminary result)   Collection Time: 06/27/22 12:05 PM   Specimen: BLOOD RIGHT ARM  Result Value Ref Range Status   Specimen Description   Final    BLOOD RIGHT ARM Performed at [facility] [address], [place], [place] [address]    Special Requests   Final    BOTTLES DRAWN AEROBIC AND ANAEROBIC Blood Culture adequate volume Performed at [facility] [address] [place], [place] [address]    Culture   Final    NO GROWTH 3 DAYS Performed at [facility], [place] [address] [place], [place] [address]    Report Status PENDING  Incomplete  MRSA Next Gen by PCR, Nasal     Status: None   Collection Time: 06/27/22  9:22 PM   Specimen: Nasal Mucosa; Nasal Swab   Result Value Ref Range Status   MRSA by PCR Next Gen NOT DETECTED NOT DETECTED Final    Comment: (NOTE) The GeneXpert MRSA Assay (FDA approved for NASAL specimens only), is one component of a comprehensive MRSA colonization surveillance program. It is not intended to diagnose MRSA infection nor to guide or monitor treatment for MRSA infections. Test performance is not FDA approved in patients  less than 89 years old. Performed at [facility], [place] [address], [place],  [address]     Labs: CBC: Recent Labs  Lab 06/27/22 1159 06/28/22 0431  WBC 8.9 7.4  NEUTROABS 6.0  --   HGB 10.0* 9.0*  HCT 32.7* 30.3*  MCV 86.1 87.6  PLT 264 [id number]   Basic Metabolic Panel: Recent Labs  Lab 06/27/22 1159 06/28/22 0431 06/29/22 0444 06/30/22 0430  NA 130* 138  --  136  K 4.3 3.8  --  3.8  CL 98 109  --  107  CO2 21* 22  --  22  GLUCOSE 98 90  --  127*  BUN 53* 42*  --  24*  CREATININE 1.55* 1.19* 0.87 0.80  CALCIUM 8.7* 8.2*  --  8.2*   Liver Function Tests: Recent Labs  Lab 06/27/22 1159 06/28/22 0431  AST 18 14*  ALT 10 9  ALKPHOS 88 71  BILITOT 0.5 0.6  PROT 7.4 5.9*  ALBUMIN 3.9 3.0*   CBG: No results for input(s): "GLUCAP" in the last 168 hours.  Discharge time spent: greater than 30 minutes.  Signed: Elmarie Shiley, MD [facility] 06/30/2022

## 2022-07-01 DIAGNOSIS — N179 Acute kidney failure, unspecified: Secondary | ICD-10-CM | POA: Diagnosis not present

## 2022-07-01 MED ORDER — AMOXICILLIN-POT CLAVULANATE 875-125 MG PO TABS
1.0000 | ORAL_TABLET | Freq: Two times a day (BID) | ORAL | Status: DC
  Administered 2022-07-01 – 2022-07-03 (×4): 1 via ORAL
  Filled 2022-07-01 (×4): qty 1

## 2022-07-01 MED ORDER — DOXYCYCLINE HYCLATE 100 MG PO TABS
100.0000 mg | ORAL_TABLET | Freq: Two times a day (BID) | ORAL | Status: DC
  Administered 2022-07-01 – 2022-07-03 (×4): 100 mg via ORAL
  Filled 2022-07-01 (×4): qty 1

## 2022-07-01 NOTE — NC FL2 (Signed)
[place] LEVEL OF CARE FORM     IDENTIFICATION  Patient Name: [name] Birthdate: 1940/04/24 Sex: female Admission Date (Current Location): 06/27/2022  [facility] and [place] Number:  Herbalist and Address:  [facility],  [place] [address] [place]      Provider Number:    Attending Physician Name and Address:  Elmarie Shiley, MD  Relative Name and Phone Number:  Morton Stall (son) [phone]    Current Level of Care: Hospital Recommended Level of Care: [place] Prior Approval Number:    Date Approved/Denied:   PASRR Number:    Discharge Plan: Other (Comment) ([place])    Current Diagnoses: Patient Active Problem List   Diagnosis Date Noted   AKI (acute kidney injury) ([place]) 06/27/2022   Normocytic anemia 06/27/2022   Hypomagnesemia 08/09/2021   Pneumococcal bacteremia 08/08/2021   Anemia of chronic disease    Pneumococcal pneumonia ([place]) 123456   Acute metabolic encephalopathy 123456   Acute respiratory failure with hypoxia ([place]) 08/07/2021   Septic shock ([place]) 08/07/2021   Hypokalemia 08/07/2021   COVID-19 virus infection 07/07/2021   CAP (community acquired pneumonia) 07/07/2021   Dysphagia, oropharyngeal phase 04/23/2021   Vitamin D deficiency [id number]   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 ([place]) 01/22/2017   Hypertension 01/01/2017   Hyperlipidemia 01/01/2017   Persistent atrial fibrillation ([place]) 01/01/2017   Hypothyroidism 01/01/2017   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 Wound type:full thickness,neuropathic  Pressure Injury POA: N/A)                       Personal Care Assistance Level of Assistance  Bathing, Feeding, Dressing Bathing Assistance: Limited assistance Feeding assistance: Limited assistance Dressing Assistance: Limited assistance     Functional Limitations Info  Sight, Hearing, Speech Sight Info: Impaired Hearing Info: Adequate Speech Info: Adequate    SPECIAL CARE FACTORS FREQUENCY  PT (By licensed PT) (HHPT at facility if able to provide; if not recc SNF)                    Contractures Contractures Info: Not present    Additional Factors Info  Code Status, Allergies Code Status Info: full Allergies Info: Sulfa Antibiotics           Current Medications (07/01/2022):  This is the current hospital active medication list Current Facility-Administered Medications  Medication Dose Route Frequency Provider Last Rate Last Admin   acetaminophen (TYLENOL) tablet 650 mg  650 mg Oral Q6H PRN Reubin Milan, MD   650 mg at 07/01/22 1419   Or   acetaminophen (TYLENOL) suppository 650 mg  650 mg Rectal Q6H PRN Reubin Milan, MD       amoxicillin-clavulanate (AUGMENTIN) 875-125 MG per tablet 1 tablet  1 tablet Oral Q12H Regalado, Belkys A, MD       apixaban (ELIQUIS) tablet 2.5 mg  2.5 mg Oral BID Reubin Milan, MD   2.5 mg at 07/01/22 [phone]  aspirin EC tablet 81 mg  81 mg Oral Daily Reubin Milan, MD   81 mg at 07/01/22 [id number]   atorvastatin (LIPITOR) tablet 20 mg  20 mg Oral Daily Reubin Milan, MD   20 mg at 07/01/22 0959   doxycycline (VIBRA-TABS) tablet 100 mg  100 mg Oral Q12H Regalado, Belkys A, MD       escitalopram (LEXAPRO) tablet 2.5 mg  2.5 mg Oral QHS Reubin Milan, MD   2.5 mg at 06/30/22 2102   feeding supplement (ENSURE ENLIVE / ENSURE PLUS) liquid 237 mL  237 mL Oral TID BM Regalado, Belkys A, MD   237 mL at 07/01/22  1411   ferrous sulfate tablet 325 mg  325 mg Oral TID WC Reubin Milan, MD   325 mg at 07/01/22 1238   levothyroxine (SYNTHROID) tablet 112 mcg  112 mcg Oral QAC breakfast Reubin Milan, MD   112 mcg at 07/01/22 0555   melatonin tablet 10 mg  10 mg Oral QHS PRN Raenette Rover, NP   10 mg at 06/30/22 0059   metoprolol tartrate (LOPRESSOR) tablet 25 mg  25 mg Oral BID Regalado, Belkys A, MD   25 mg at 07/01/22 0959   mirtazapine (REMERON) tablet 15 mg  15 mg Oral QHS Reubin Milan, MD   15 mg at 06/30/22 2102   ondansetron (ZOFRAN) tablet 4 mg  4 mg Oral Q6H PRN Reubin Milan, MD       Or   ondansetron [facility]) injection 4 mg  4 mg Intravenous Q6H PRN Reubin Milan, MD   4 mg at 06/27/22 1959   pantoprazole (PROTONIX) EC tablet 40 mg  40 mg Oral Daily Reubin Milan, MD   40 mg at 07/01/22 0959   polyethylene glycol (MIRALAX / GLYCOLAX) packet 17 g  17 g Oral Daily PRN Reubin Milan, MD       traMADol Veatrice [place]) tablet 25 mg  25 mg Oral Q6H PRN Regalado, Belkys A, MD   25 mg at 07/01/22 [id number]     Discharge Medications: Please see discharge summary for a list of discharge medications.  Relevant Imaging Results:  Relevant Lab Results:   Additional Information SSN [id number]  Henrietta Dine, RN

## 2022-07-01 NOTE — TOC Progression Note (Addendum)
Transition of Care [facility]) - Progression Note    Patient Details  Name: [name] MRN: [id number] Date of Birth: Sep 01, 1940  Transition of [facility]) CM/SW Contact  Henrietta Dine, RN Phone Number: 07/01/2022, 12:58 PM  Clinical Narrative:    Pt from [place]; called facility and spoke w/ Maudie [place], [facility]; she says the facility does not admit on weekends, and pt can return tomorrow; Dr Tyrell Antonio notified; PT recc HHPT at AL vs SNF; please see PT note; will pass on to oncoming TOC.   Expected Discharge Plan:  ([place]) Barriers to Discharge: Continued Medical Work up  Expected Discharge Plan and Services   Discharge Planning Services: CM Consult   Living arrangements for the past 2 months: [place] Expected Discharge Date: 07/01/22                                     Social Determinants of Health (SDOH) Interventions SDOH Screenings   Food Insecurity: No Food Insecurity (06/27/2022)  Housing: Low Risk  (06/27/2022)  Transportation Needs: No Transportation Needs (06/27/2022)  Utilities: Not At Risk (06/27/2022)  Tobacco Use: Medium Risk (06/27/2022)    Readmission Risk Interventions    08/11/2021   10:29 AM 08/09/2021    2:34 PM  Readmission Risk Prevention Plan  Transportation Screening Complete Complete  PCP or Specialist Appt within 3-5 Days Complete   HRI or [place] Complete Complete  Social Work Consult for [place] Planning/Counseling Complete Complete  Palliative Care Screening Not Applicable Not Applicable  Medication Review Press photographer) Complete Complete

## 2022-07-01 NOTE — Progress Notes (Addendum)
PROGRESS NOTE    [name]  [id number] DOB: 02/15/1941 DOA: 06/27/2022 PCP: Lubertha Sayres, FNP   Brief Narrative: 82 year old with past medical history significant for anemia, A-fib, chest pain syndrome, GERD, hyperlipidemia, hypertension, hypothyroidism, history of CVA, history of digital vasculitis, unspecified facial abnormality, who presented from wound care center due to hypotension and bradycardia.  Patient reports feeling weak and having occasionally lightheaded.  Evaluation in the ED she was found to have a blood pressure 94/50, heart rate in the 70s.  She received IV bolus.  Lactic acid 2.2 overnight increased to 6.  Chest x-ray showed resolution of prior heterogeneous opacities and prior bilateral pleural effusion.   Assessment & Plan:   Principal Problem:   AKI (acute kidney injury) ([place]) Active Problems:   Persistent atrial fibrillation (HCC)   Hypertension   Hyperlipidemia   Hypothyroidism   Normocytic anemia   1-Lactic Acidosis, Hypotension; Concern for infection left foot. She has mild redness.  Repeated lactic acid. Down to 1 from 6.  Received IV fluids.  Continue to hold Cardizem.  Treated with Antibiotics.  BP improved.   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  Continue with wound care.  X ray ordered.  No definite evidence of  osteomyelitis.  Transition to oral antibiotics.   Bradycardia: Heart rate increasing.  Will continue to hold Cardizem.  Resume  metoprolol.  Hyponatremia; Received IV fluids.  Monitor.   AKI: in setting hypotension.  Improved with Fluids. NSL  Continue to hold diuretics and ramipril.   Persistent A-fib: Holding metoprolol and Cardizem due to bradycardia and hypotension.   Hypertension:  Hold  Cardizem due to hypotension.  Low dose metoprolol resume.   Hypothyroidism: Continue with synthroid.   Normocytic anemia: Monitor hb.      Estimated body mass index is 19.03 kg/m as calculated from the following:   Height as of this encounter: '5\' 7"'$  (1.702 m).   Weight as of this encounter: 55.1 kg.   DVT prophylaxis: eliquis Code Status: Wishes to be full code Family Communication: son over phone 3/10 Disposition Plan:  Status is: Observation The patient will require care spanning > 2 midnights and should be moved to inpatient because: management foot infection  Facility does not accept patient over weekend.     Consultants:  None  Procedures:    Antimicrobials:    Subjective: Stable for discharge. No new complaints.   Objective: Vitals:   06/30/22 0612 06/30/22 1238 06/30/22 1948 07/01/22 0600  BP: 128/78 126/69 121/67 139/76  Pulse: 100 79 (!) 110 80  Resp: '18 18 16 16  '$ Temp: 98.4 F (36.9 C) 98.2 F (36.8 C) 98.7 F (37.1 C) 97.6 F (36.4 C)  TempSrc: Oral Oral Oral Oral  SpO2: 98%  97%   Weight:      Height:        Intake/Output Summary (Last 24 hours) at 07/01/2022 1305 Last data filed at 07/01/2022 1030 Gross per 24 hour  Intake 1607.53 ml  Output --  Net 1607.53 ml    Filed Weights   06/27/22 1127 06/27/22 1835  Weight: 59 kg 55.1 kg    Examination:  General exam: NAD Respiratory system: CTA Cardiovascular system: S 1, S 2 RRR Gastrointestinal system: BS present, soft, nt Central nervous system: Alert Extremities: no edema Skin: Left foot with redness, deformity toes.     Data Reviewed: I have personally reviewed following labs and imaging studies  CBC: Recent Labs  Lab 06/27/22 1159 06/28/22 0431  WBC 8.9 7.4  NEUTROABS 6.0  --   HGB 10.0* 9.0*  HCT 32.7* 30.3*  MCV 86.1 87.6  PLT 264 [id number]    Basic Metabolic Panel: Recent Labs  Lab 06/27/22 1159 06/28/22 0431 06/29/22 0444 06/30/22 0430  NA 130* 138  --  136  K 4.3 3.8  --  3.8  CL 98 109  --  107  CO2 21* 22  --  22  GLUCOSE 98 90  --  127*  BUN 53* 42*  --  24*  CREATININE 1.55* 1.19* 0.87  0.80  CALCIUM 8.7* 8.2*  --  8.2*    GFR: Estimated Creatinine Clearance: 48.8 mL/min (by C-G formula based on SCr of 0.8 mg/dL). Liver Function Tests: Recent Labs  Lab 06/27/22 1159 06/28/22 0431  AST 18 14*  ALT 10 9  ALKPHOS 88 71  BILITOT 0.5 0.6  PROT 7.4 5.9*  ALBUMIN 3.9 3.0*    No results for input(s): "LIPASE", "AMYLASE" in the last 168 hours. No results for input(s): "AMMONIA" in the last 168 hours. Coagulation Profile: Recent Labs  Lab 06/27/22 1159  INR 1.7*    Cardiac Enzymes: No results for input(s): "CKTOTAL", "CKMB", "CKMBINDEX", "TROPONINI" in the last 168 hours. BNP (last 3 results) No results for input(s): "PROBNP" in the last 8760 hours. HbA1C: No results for input(s): "HGBA1C" in the last 72 hours. CBG: No results for input(s): "GLUCAP" in the last 168 hours. Lipid Profile: No results for input(s): "CHOL", "HDL", "LDLCALC", "TRIG", "CHOLHDL", "LDLDIRECT" in the last 72 hours. Thyroid Function Tests: No results for input(s): "TSH", "T4TOTAL", "FREET4", "T3FREE", "THYROIDAB" in the last 72 hours. Anemia Panel: No results for input(s): "VITAMINB12", "FOLATE", "FERRITIN", "TIBC", "IRON", "RETICCTPCT" in the last 72 hours. Sepsis Labs: Recent Labs  Lab 06/27/22 1159 06/27/22 1430 06/27/22 1802 06/28/22 0950  PROCALCITON  --   --   --  <0.10  LATICACIDVEN 2.2* 2.5* 6.0* 1.5     Recent Results (from the past 240 hour(s))  Culture, blood (routine x 2)     Status: None (Preliminary result)   Collection Time: 06/27/22 11:59 AM   Specimen: BLOOD RIGHT HAND  Result Value Ref Range Status   Specimen Description   Final    BLOOD RIGHT HAND Performed at [facility], [place] [address], [place], [place] [address]    Special Requests   Final    BOTTLES DRAWN AEROBIC AND ANAEROBIC Blood Culture adequate volume Performed at [place] [address] [place], [place] [address]    Culture   Final    NO GROWTH 4  DAYS Performed at [facility], [place] [address] [place], [place] [address]    Report Status PENDING  Incomplete  Culture, blood (routine x 2)     Status: None (Preliminary result)   Collection Time: 06/27/22 12:05 PM   Specimen: BLOOD RIGHT ARM  Result Value Ref Range Status   Specimen Description   Final    BLOOD RIGHT ARM Performed at [place] [address], [place], [place] [address]    Special Requests   Final    BOTTLES DRAWN AEROBIC AND ANAEROBIC Blood Culture adequate volume Performed at [place] [address], [place], [place] [address]    Culture   Final    NO GROWTH 4 DAYS Performed at [facility], [place] [address] [place], [place] [address]  Report Status PENDING  Incomplete  MRSA Next Gen by PCR, Nasal     Status: None   Collection Time: 06/27/22  9:22 PM   Specimen: Nasal Mucosa; Nasal Swab  Result Value Ref Range Status   MRSA by PCR Next Gen NOT DETECTED NOT DETECTED Final    Comment: (NOTE) The GeneXpert MRSA Assay (FDA approved for NASAL specimens only), is one component of a comprehensive MRSA colonization surveillance program. It is not intended to diagnose MRSA infection nor to guide or monitor treatment for MRSA infections. Test performance is not FDA approved in patients less than 66 years old. Performed at [facility], [place] [address], [place],  [address]          Radiology Studies: No results found.      Scheduled Meds:  amoxicillin-clavulanate  1 tablet Oral Q12H   apixaban  2.5 mg Oral BID   aspirin EC  81 mg Oral Daily   atorvastatin  20 mg Oral Daily   doxycycline  100 mg Oral Q12H   escitalopram  2.5 mg Oral QHS   feeding supplement  237 mL Oral TID BM   ferrous sulfate  325 mg Oral TID WC   levothyroxine  112 mcg Oral QAC breakfast   metoprolol tartrate  25 mg Oral BID   mirtazapine  15 mg Oral QHS   pantoprazole  40 mg Oral Daily    Continuous Infusions:     LOS: 3 days    Time spent: 35 minutes    Lawonda Pretlow A Djuna Frechette, MD [facility]   If 7PM-7AM, please contact night-coverage [email or web address]  07/01/2022, 1:05 PM

## 2022-07-02 DIAGNOSIS — N179 Acute kidney failure, unspecified: Secondary | ICD-10-CM | POA: Diagnosis not present

## 2022-07-02 LAB — CULTURE, BLOOD (ROUTINE X 2)
Culture: NO GROWTH
Culture: NO GROWTH
Special Requests: ADEQUATE
Special Requests: ADEQUATE

## 2022-07-02 MED ORDER — LACTATED RINGERS IV BOLUS
250.0000 mL | Freq: Once | INTRAVENOUS | Status: AC
  Administered 2022-07-02: 250 mL via INTRAVENOUS

## 2022-07-02 MED ORDER — METOPROLOL TARTRATE 25 MG PO TABS
25.0000 mg | ORAL_TABLET | Freq: Once | ORAL | Status: AC
  Administered 2022-07-02: 25 mg via ORAL
  Filled 2022-07-02: qty 1

## 2022-07-02 MED ORDER — METOPROLOL TARTRATE 50 MG PO TABS
50.0000 mg | ORAL_TABLET | Freq: Two times a day (BID) | ORAL | Status: DC
  Administered 2022-07-02 – 2022-07-03 (×2): 50 mg via ORAL
  Filled 2022-07-02 (×2): qty 1

## 2022-07-02 NOTE — Care Management Important Message (Signed)
Important Message  Patient Details  Name: [name] MRN: [id number] Date of Birth: May 21, 1940   Medicare Important Message Given:  Yes     Memory Argue 07/02/2022, 1:13 PM

## 2022-07-02 NOTE — Progress Notes (Signed)
Physical Therapy Treatment Patient Details Name: [name] MRN: [id number] DOB: [birth year] Today's Date: 07/02/2022   History of Present Illness Pt is an 82 y.o. female who presented from wound care center due to hypotension and bradycardia.  Patient reports feeling weak and having occasionally lightheaded. 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    PT Comments    Pt agreeable to working with therapy. She required Min A to mobilize on today. She stood and took a few side steps along the bedside with a RW. Encouraged her to limit WBing on L foot as needed to help with pain control . Assisted pt back to bed. She requested to take a break from the [place] so I left it off and floated her foot with a pillow.     Recommendations for follow up therapy are one component of a multi-disciplinary discharge planning process, led by the attending physician.  Recommendations may be updated based on patient status, additional functional criteria and insurance authorization.  Follow Up Recommendations  Home health PT (at ALF as long as facility staff can provide current assist level)     Assistance Recommended at Discharge Frequent or constant Supervision/Assistance  Patient can return home with the following A lot of help with walking and/or transfers;A little help with bathing/dressing/bathroom;Help with stairs or ramp for entrance;Direct supervision/assist for financial management;Direct supervision/assist for medications management;Assistance with cooking/housework   Equipment Recommendations  None recommended by PT    Recommendations for Other Services       Precautions / Restrictions Precautions Precautions: Fall Precaution Comments: L foot wound (dressing in place) Restrictions Weight Bearing Restrictions: No      Mobility  Bed Mobility Overal bed mobility: Needs Assistance Bed Mobility: Supine to Sit, Sit to Supine     Supine to sit: Supervision, HOB elevated Sit to supine: Min assist   General bed mobility comments: Supv for safety. Cues provided. Increased time. Min A for LEs back onto bed.    Transfers Overall transfer level: Needs assistance Equipment used: Rolling walker (2 wheels) Transfers: Sit to/from Stand Sit to Stand: Min assist, From elevated surface          General transfer comment: Assist to power up, steady, control descent. Cues for hand placement and for pt to limit weightbearing on L foot as needed to help with pain control.    Ambulation/Gait Ambulation/Gait assistance: Min assist   Assistive device: Rolling walker (2 wheels)         General Gait Details: side steps along bedside with RW. cues for safety, technique. assist to stabilize and manage RW intermittently.   Stairs             Wheelchair Mobility    Modified Rankin (Stroke Patients Only)       Balance Overall balance assessment: Needs assistance         Standing balance support: Bilateral upper extremity supported, During functional activity, Reliant on assistive device for balance Standing balance-Leahy Scale: Poor                              Cognition Arousal/Alertness: Awake/alert Behavior During Therapy: WFL for tasks assessed/performed Overall Cognitive Status: No family/caregiver present to determine baseline cognitive functioning  General Comments: pleasant, noted short term memory impairments , not oriented to time. place., siutation. Report she has diffuclty recalling dates at baseline.        Exercises      General Comments        Pertinent Vitals/Pain Pain Assessment Pain Assessment: Faces Faces Pain Scale: Hurts even more Pain Location: L LE, foot Pain Descriptors / Indicators: Grimacing,  Sore, Discomfort Pain Intervention(s): Limited activity within patient's tolerance, Monitored during session, Repositioned    Home Living                          Prior Function            PT Goals (current goals can now be found in the care plan section) Acute Rehab PT Goals Patient Stated Goal: Have less pain to be able to move better PT Goal Formulation: With patient Time For Goal Achievement: 07/16/22 Potential to Achieve Goals: Good    Frequency    Min 3X/week      PT Plan      Co-evaluation              AM-PAC PT "6 Clicks" Mobility   Outcome Measure  Help needed turning from your back to your side while in a flat bed without using bedrails?: A Little Help needed moving from lying on your back to sitting on the side of a flat bed without using bedrails?: A Little Help needed moving to and from a bed to a chair (including a wheelchair)?: A Lot Help needed standing up from a chair using your arms (e.g., wheelchair or bedside chair)?: A Little Help needed to walk in hospital room?: A Lot Help needed climbing 3-5 steps with a railing? : Total 6 Click Score: 14    End of Session   Activity Tolerance: Patient limited by pain Patient left: in bed;with call bell/phone within reach;with bed alarm set   PT Visit Diagnosis: Pain;Difficulty in walking, not elsewhere classified (R26.2);Muscle weakness (generalized) (M62.81) Pain - Right/Left: Left Pain - part of body: Ankle and joints of foot;Leg     Time: 1620-1640 PT Time Calculation (min) (ACUTE ONLY): 20 min  Charges:  $Gait Training: 8-22 mins              Doreatha Massed, PT Acute Rehabilitation  Office: [phone]

## 2022-07-02 NOTE — TOC Progression Note (Signed)
Transition of Care [facility]) - Progression Note    Patient Details  Name: [name] MRN: [id number] Date of Birth: 11/02/1940  [facility]) CM/SW [place], LCSW Phone Number: 07/02/2022, 2:40 PM  Clinical Narrative:    CSW spoke with ALF they reported pt is active with [facility].They are requesting CSW faxed pt's FL2, [place] orders and d/c summary to ALF for review. TOC to follow   Expected Discharge Plan:  ([place]) Barriers to Discharge: Continued Medical Work up  Expected Discharge Plan and Services   Discharge Planning Services: CM Consult   Living arrangements for the past 2 months: [place] Expected Discharge Date: 07/01/22                                     Social Determinants of Health (SDOH) Interventions SDOH Screenings   Food Insecurity: No Food Insecurity (06/27/2022)  Housing: Low Risk  (06/27/2022)  Transportation Needs: No Transportation Needs (06/27/2022)  Utilities: Not At Risk (06/27/2022)  Tobacco Use: Medium Risk (06/27/2022)    Readmission Risk Interventions    08/11/2021   10:29 AM 08/09/2021    2:34 PM  Readmission Risk Prevention Plan  Transportation Screening Complete Complete  PCP or Specialist Appt within 3-5 Days Complete   HRI or [place] Complete Complete  Social Work Consult for [place] Planning/Counseling Complete Complete  Palliative Care Screening Not Applicable Not Applicable  Medication Review Press photographer) Complete Complete

## 2022-07-02 NOTE — Progress Notes (Signed)
PROGRESS NOTE    [name]  [id number] DOB: Sep 05, 1940 DOA: 06/27/2022 PCP: Lubertha Sayres, FNP   Brief Narrative: 82 year old with past medical history significant for anemia, A-fib, chest pain syndrome, GERD, hyperlipidemia, hypertension, hypothyroidism, history of CVA, history of digital vasculitis, unspecified facial abnormality, who presented from wound care center due to hypotension and bradycardia.  Patient reports feeling weak and having occasionally lightheaded.  Evaluation in the ED she was found to have a blood pressure 94/50, heart rate in the 70s.  She received IV bolus.  Lactic acid 2.2 overnight increased to 6.  Chest x-ray showed resolution of prior heterogeneous opacities and prior bilateral pleural effusion.   Assessment & Plan:   Principal Problem:   AKI (acute kidney injury) ([place]) Active Problems:   Persistent atrial fibrillation (HCC)   Hypertension   Hyperlipidemia   Hypothyroidism   Normocytic anemia   1-Lactic Acidosis, Hypotension; Concern for infection left foot. She has mild redness.  Repeated lactic acid. Down to 1 from 6.  Received IV fluids.  Continue to hold Cardizem.  Treated with Antibiotics.  BP improved.   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  Continue with wound care.  X ray ordered.  No definite evidence of  osteomyelitis.  Transition to oral antibiotics.   Bradycardia: Heart rate increasing.  Will continue to hold Cardizem.   Hyponatremia; Received IV fluids.  Monitor.   AKI: in setting hypotension.  Improved with Fluids. NSL  Continue to hold diuretics and ramipril.   Persistent A-fib: Initially metoprolol and Cardizem held due to bradycardia and hypotension.  Had increased HR overnight.  Will increase metoprolol to 50 mg BID.  Holding Cardizem.   Hypertension:  Hold  Cardizem due to hypotension.  Low dose metoprolol resume.    Hypothyroidism: Continue with synthroid.   Normocytic anemia: Monitor hb.     Estimated body mass index is 19.03 kg/m as calculated from the following:   Height as of this encounter: '5\' 7"'$  (1.702 m).   Weight as of this encounter: 55.1 kg.   DVT prophylaxis: eliquis Code Status: Wishes to be full code Family Communication: son over phone 3/10 Disposition Plan:  Status is: Observation The patient will require care spanning > 2 midnights and should be moved to inpatient because: management foot infection  Facility does not accept patient over weekend.     Consultants:  None  Procedures:    Antimicrobials:    Subjective: HR increased this am. Denies dyspnea, chest pain.   Objective: Vitals:   07/02/22 0655 07/02/22 0730 07/02/22 0818 07/02/22 1021  BP:  121/64 101/87   Pulse: (!) 122  (!) 117 98  Resp:  17 18   Temp:  98.3 F (36.8 C) 98 F (36.7 C)   TempSrc:      SpO2:  95% 96%   Weight:      Height:        Intake/Output Summary (Last 24 hours) at 07/02/2022 1409 Last data filed at 07/02/2022 0900 Gross per 24 hour  Intake 600 ml  Output 1700 ml  Net -1100 ml    Filed Weights   06/27/22 1127 06/27/22 1835  Weight: 59 kg 55.1 kg    Examination:  General exam: NAD Respiratory system: CTA Cardiovascular system: S 1, S 2 RRR Gastrointestinal system: BS present, soft, nt Central nervous system: Alert Extremities: No edema Skin: Left foot with redness, deformity toes.  Data Reviewed: I have personally reviewed following labs and imaging studies  CBC: Recent Labs  Lab 06/27/22 1159 06/28/22 0431  WBC 8.9 7.4  NEUTROABS 6.0  --   HGB 10.0* 9.0*  HCT 32.7* 30.3*  MCV 86.1 87.6  PLT 264 [id number]    Basic Metabolic Panel: Recent Labs  Lab 06/27/22 1159 06/28/22 0431 06/29/22 0444 06/30/22 0430  NA 130* 138  --  136  K 4.3 3.8  --  3.8  CL 98 109  --  107  CO2 21* 22  --  22  GLUCOSE 98 90  --  127*  BUN 53* 42*  --  24*   CREATININE 1.55* 1.19* 0.87 0.80  CALCIUM 8.7* 8.2*  --  8.2*    GFR: Estimated Creatinine Clearance: 48.8 mL/min (by C-G formula based on SCr of 0.8 mg/dL). Liver Function Tests: Recent Labs  Lab 06/27/22 1159 06/28/22 0431  AST 18 14*  ALT 10 9  ALKPHOS 88 71  BILITOT 0.5 0.6  PROT 7.4 5.9*  ALBUMIN 3.9 3.0*    No results for input(s): "LIPASE", "AMYLASE" in the last 168 hours. No results for input(s): "AMMONIA" in the last 168 hours. Coagulation Profile: Recent Labs  Lab 06/27/22 1159  INR 1.7*    Cardiac Enzymes: No results for input(s): "CKTOTAL", "CKMB", "CKMBINDEX", "TROPONINI" in the last 168 hours. BNP (last 3 results) No results for input(s): "PROBNP" in the last 8760 hours. HbA1C: No results for input(s): "HGBA1C" in the last 72 hours. CBG: No results for input(s): "GLUCAP" in the last 168 hours. Lipid Profile: No results for input(s): "CHOL", "HDL", "LDLCALC", "TRIG", "CHOLHDL", "LDLDIRECT" in the last 72 hours. Thyroid Function Tests: No results for input(s): "TSH", "T4TOTAL", "FREET4", "T3FREE", "THYROIDAB" in the last 72 hours. Anemia Panel: No results for input(s): "VITAMINB12", "FOLATE", "FERRITIN", "TIBC", "IRON", "RETICCTPCT" in the last 72 hours. Sepsis Labs: Recent Labs  Lab 06/27/22 1159 06/27/22 1430 06/27/22 1802 06/28/22 0950  PROCALCITON  --   --   --  <0.10  LATICACIDVEN 2.2* 2.5* 6.0* 1.5     Recent Results (from the past 240 hour(s))  Culture, blood (routine x 2)     Status: None (Preliminary result)   Collection Time: 06/27/22 11:59 AM   Specimen: BLOOD RIGHT HAND  Result Value Ref Range Status   Specimen Description   Final    BLOOD RIGHT HAND Performed at [facility], [place] [address], [place], [place] [address]    Special Requests   Final    BOTTLES DRAWN AEROBIC AND ANAEROBIC Blood Culture adequate volume Performed at [facility] [address], [place], [place] [address]     Culture   Final    NO GROWTH 4 DAYS Performed at [facility], [place] [address] [place], [place] [address]    Report Status PENDING  Incomplete  Culture, blood (routine x 2)     Status: None (Preliminary result)   Collection Time: 06/27/22 12:05 PM   Specimen: BLOOD RIGHT ARM  Result Value Ref Range Status   Specimen Description   Final    BLOOD RIGHT ARM Performed at [facility] [address] [place], [place] [address]    Special Requests   Final    BOTTLES DRAWN AEROBIC AND ANAEROBIC Blood Culture adequate volume Performed at [facility] [address], [place], [place] [address]    Culture   Final    NO GROWTH 4 DAYS Performed at  [facility], [place] [address] [place], [place] [address]    Report Status PENDING  Incomplete  MRSA Next Gen by PCR, Nasal     Status: None   Collection Time: 06/27/22  9:22 PM   Specimen: Nasal Mucosa; Nasal Swab  Result Value Ref Range Status   MRSA by PCR Next Gen NOT DETECTED NOT DETECTED Final    Comment: (NOTE) The GeneXpert MRSA Assay (FDA approved for NASAL specimens only), is one component of a comprehensive MRSA colonization surveillance program. It is not intended to diagnose MRSA infection nor to guide or monitor treatment for MRSA infections. Test performance is not FDA approved in patients less than 57 years old. Performed at [facility], [place] [address] [place], [place] [address]          Radiology Studies: No results found.      Scheduled Meds:  amoxicillin-clavulanate  1 tablet Oral Q12H   apixaban  2.5 mg Oral BID   aspirin EC  81 mg Oral Daily   atorvastatin  20 mg Oral Daily   doxycycline  100 mg Oral Q12H   escitalopram  2.5 mg Oral QHS   feeding supplement  237 mL Oral TID BM   ferrous sulfate  325 mg Oral TID WC   levothyroxine  112 mcg Oral QAC breakfast   metoprolol tartrate  50 mg Oral BID   mirtazapine  15 mg Oral QHS    pantoprazole  40 mg Oral Daily   Continuous Infusions:     LOS: 4 days    Time spent: 35 minutes    Khala Tarte A Estefani Bateson, MD [facility]   If 7PM-7AM, please contact night-coverage [email or web address]  07/02/2022, 2:09 PM

## 2022-07-03 DIAGNOSIS — N179 Acute kidney failure, unspecified: Secondary | ICD-10-CM | POA: Diagnosis not present

## 2022-07-03 LAB — BASIC METABOLIC PANEL
Anion gap: 8 (ref 5–15)
BUN: 23 mg/dL (ref 8–23)
CO2: 25 mmol/L (ref 22–32)
Calcium: 8 mg/dL — ABNORMAL LOW (ref 8.9–10.3)
Chloride: 103 mmol/L (ref 98–111)
Creatinine, Ser: 0.76 mg/dL (ref 0.44–1.00)
GFR, Estimated: >60 mL/min (ref >60)
Glucose, Bld: 132 mg/dL — ABNORMAL HIGH (ref 70–99)
Potassium: 4 mmol/L (ref 3.5–5.1)
Sodium: 136 mmol/L (ref 135–145)

## 2022-07-03 MED ORDER — DOXYCYCLINE HYCLATE 100 MG PO CAPS: 100.0000 mg | ORAL_CAPSULE | Freq: Two times a day (BID) | ORAL | 0 refills | Status: DC

## 2022-07-03 MED ORDER — ASPIRIN EC 81 MG PO TBEC: 81.0000 mg | DELAYED_RELEASE_TABLET | Freq: Every day | ORAL | 11 refills | Status: DC

## 2022-07-03 MED ORDER — APIXABAN 2.5 MG PO TABS: 2.5000 mg | ORAL_TABLET | Freq: Two times a day (BID) | ORAL | 0 refills | Status: AC

## 2022-07-03 MED ORDER — AMOXICILLIN-POT CLAVULANATE 875-125 MG PO TABS: 1.0000 | ORAL_TABLET | Freq: Two times a day (BID) | ORAL | 0 refills | Status: DC

## 2022-07-03 MED ORDER — METOPROLOL TARTRATE 50 MG PO TABS: 50.0000 mg | ORAL_TABLET | Freq: Two times a day (BID) | ORAL | 2 refills | Status: DC

## 2022-07-03 MED ORDER — VITAMIN B-12 500 MCG PO TABS: 250.0000 ug | ORAL_TABLET | Freq: Every day | ORAL | 0 refills | Status: DC

## 2022-07-03 MED ORDER — METOPROLOL TARTRATE 50 MG PO TABS: 50.0000 mg | ORAL_TABLET | Freq: Three times a day (TID) | ORAL | 0 refills | Status: DC

## 2022-07-03 MED ORDER — METOPROLOL TARTRATE 50 MG PO TABS
50.0000 mg | ORAL_TABLET | Freq: Three times a day (TID) | ORAL | Status: DC
  Administered 2022-07-03: 50 mg via ORAL
  Filled 2022-07-03: qty 1

## 2022-07-03 MED ORDER — DOXYCYCLINE HYCLATE 100 MG PO CAPS: 100.0000 mg | ORAL_CAPSULE | Freq: Two times a day (BID) | ORAL | 0 refills | Status: AC

## 2022-07-03 MED ORDER — AMOXICILLIN-POT CLAVULANATE 875-125 MG PO TABS: 1.0000 | ORAL_TABLET | Freq: Two times a day (BID) | ORAL | 0 refills | Status: AC

## 2022-07-03 NOTE — Plan of Care (Signed)

## 2022-07-03 NOTE — Discharge Summary (Addendum)
Physician Discharge Summary   Patient: [name] MRN: [id number] DOB: [birth year]  Admit date:     06/27/2022  Discharge date: 07/03/22  Discharge Physician: Elmarie Shiley   PCP: Lubertha Sayres, FNP   Recommendations at discharge:   Wound care to left foot, great toe and 2nd and 3rd digits:  Cleanse with soap and water, rinse and dry thoroughly. Place folded gauze between toes to keep dry. Cover wounds with size appropriate pieces of silver hydrofiber (Aquacel Ag+ Advantage, Kellie Simmering # [id number]).  Secure with Kerlix roll gauze/paper tape. Change daily. Place foot into Prevalon boot.      2-Lasix As Needed.  3-Metoprolol change to 82m TID,Cardizem discontinue due to hypotension and bradycardia on admission.     Discharge Diagnoses: Principal Problem:   AK[place] ([place] injury) ([place] Active Problems:   Persistent atrial fibrillation (HCC)   Hypertension   Hyperlipidemia   Hypothyroidism   Normocytic anemia  Resolved Problems:   * No resolved hospital problems. *  Hospital Course: 82year old with past medical history significant for anemia, A-fib, chest pain syndrome, GERD, hyperlipidemia, hypertension, hypothyroidism, history of CVA, history of digital vasculitis, unspecified facial abnormality, who presented from wound care center due to hypotension and bradycardia.  Patient reports feeling weak and having occasionally lightheaded.   Evaluation in the ED she was found to have a blood pressure 94/50, heart rate in the 70s.  She received IV bolus.  Lactic acid 2.2 overnight increased to 6.  Chest x-ray showed resolution of prior heterogeneous opacities and prior bilateral pleural effusion.   Assessment and Plan: 1-Lactic Acidosis, Hypotension; Concern for infection left foot. She has mild redness.  Repeated lactic acid. Down to 1 from 6.  Received IV fluids.  Continue to hold Cardizem.  Treated with Antibiotics.  BP improved.    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  Continue with wound care.  X ray ordered.  No definite evidence of  osteomyelitis.  Transition to oral antibiotics. Augmentin and Doxy for 5 days.    Bradycardia: Heart rate increased. .  Will continue to hold Cardizem.    Hyponatremia; Received IV fluids.  Monitor.    AKI: in setting hypotension.  Improved with Fluids. NSL  Continue to hold diuretics and ramipril.    Persistent A-fib: Initially metoprolol and Cardizem held due to bradycardia and hypotension.  Increased  metoprolol to 50 mg BID.  Holding Cardizem.  HR yesterday better on 50 mg on metoprolol BID. If HR is stable today plan to discharge today.  HR still mildly elevated, will change metoprolol to 50 mg TID.   Hypertension:  Hold  Cardizem due to hypotension.  Low dose metoprolol resume.    Hypothyroidism: Continue with synthroid.    Normocytic anemia: Monitor hb.           Consultants: None Procedures performed: None Disposition: Assisted living Diet recommendation:  Discharge Diet Orders (From admission, onward)     Start     Ordered   06/30/22 0000  Diet - low sodium heart healthy        06/30/22 1017           Cardiac diet DISCHARGE MEDICATION: Allergies as of 07/03/2022       Reactions   Sulfa Antibiotics Other (See Comments)   Reaction         Medication List     STOP taking these medications  cephALEXin 500 MG capsule Commonly known as: Keflex   diltiazem 240 MG 24 hr capsule Commonly known as: CARDIZEM CD   docusate sodium 100 MG capsule Commonly known as: Colace   furosemide 20 MG tablet Commonly known as: LASIX   ramipril 2.5 MG capsule Commonly known as: ALTACE       TAKE these medications    amoxicillin-clavulanate 875-125 MG tablet Commonly known as: AUGMENTIN Take 1 tablet by mouth 2 (two) times daily for 5 days.   apixaban 2.5 MG Tabs tablet Commonly known as:  ELIQUIS Take 1 tablet (2.5 mg total) by mouth 2 (two) times daily. What changed:  medication strength how much to take   aspirin EC 81 MG tablet Take 81 mg by mouth daily.   atorvastatin 20 MG tablet Commonly known as: LIPITOR Take 20 mg by mouth daily.   Biofreeze 4 % Gel Generic drug: Menthol (Topical Analgesic) Apply 1 application topically See admin instructions. Apply to right shoulder twice a day   calcium carbonate 500 MG chewable tablet Commonly known as: TUMS - dosed in mg elemental calcium Chew 1 tablet by mouth 2 (two) times daily with a meal.   clobetasol 0.05 % external solution Commonly known as: TEMOVATE Apply 1 Application topically every other day.   doxycycline 100 MG capsule Commonly known as: VIBRAMYCIN Take 1 capsule (100 mg total) by mouth 2 (two) times daily for 5 days.   escitalopram 5 MG tablet Commonly known as: LEXAPRO Take 2.5 mg by mouth at bedtime.   ferrous sulfate 325 (65 FE) MG tablet Take 325 mg by mouth 3 (three) times daily with meals.   levothyroxine 112 MCG tablet Commonly known as: SYNTHROID Take 112 mcg by mouth daily before breakfast.   metoprolol tartrate 50 MG tablet Commonly known as: LOPRESSOR Take 1 tablet (50 mg total) by mouth every 8 (eight) hours. What changed:  medication strength how much to take when to take this   mirtazapine 15 MG tablet Commonly known as: REMERON Take 15 mg by mouth at bedtime.   mupirocin ointment 2 % Commonly known as: BACTROBAN Apply 1 Application topically daily.   omeprazole 20 MG capsule Commonly known as: PRILOSEC Take 20 mg by mouth daily.   polyethylene glycol 17 g packet Commonly known as: MIRALAX / GLYCOLAX Take 17 g by mouth daily as needed for mild constipation or moderate constipation.   promethazine 12.5 MG tablet Commonly known as: PHENERGAN Take 12.5 mg by mouth daily as needed for nausea or vomiting.   SYSTANE BALANCE OP Place 1 drop into both eyes 2 (two)  times daily.   vitamin B-12 500 MCG tablet Commonly known as: CYANOCOBALAMIN Take 0.5 tablets (250 mcg total) by mouth daily. What changed: how much to take               Discharge Care Instructions  (From admission, onward)           Start     Ordered   07/03/22 0000  Discharge wound care:       Comments: See above   07/03/22 1008   07/01/22 0000  Discharge wound care:       Comments: See above   07/01/22 1256   06/30/22 0000  Discharge wound care:       Comments: See above   06/30/22 1017            Discharge Exam: Filed Weights   06/27/22 1127 06/27/22 1835  Weight: 59 kg 55.1  kg   General;  NAD Condition at discharge: stable  The results of significant diagnostics from this hospitalization (including imaging, microbiology, ancillary and laboratory) are listed below for reference.   Imaging Studies: DG Foot 2 Views Left  Result Date: 06/28/2022 CLINICAL DATA:  Wound cellulitis EXAM: LEFT FOOT - 2 VIEW COMPARISON:  Radiograph 12/05/2021 FINDINGS: Postsurgical changes of prior hindfoot arthrodesis with unchanged fixation screws. There is bony fusion in the midfoot with flatfoot deformity. There is an unchanged displaced anchor overlying the soft tissue of the anterior/dorsal aspect of the ankle joint. Diffuse osteopenia. No evidence of acute fracture. Chronic periosteal change along the fifth metatarsal shaft. Lateral dislocation of the fourth toe PIP joint. Joint deformity with medial angulation and bony fusion of the third toe PIP joint. Multiple toe flexion deformities. Chronic deformity of the great toe proximal phalanx. There is no definite new bone erosion or frank bony destruction radiographically. Plantar calcaneal spur. Small Haglund deformity. Diffuse soft tissue swelling of the foot and ankle. IMPRESSION: Soft tissue swelling of the foot and ankle. Diffuse osteopenia. No definite radiographic evidence of osteomyelitis. Electronically Signed   By: Maurine Simmering M.D.   On: 06/28/2022 10:46   DG Chest Port 1 View  Result Date: 06/27/2022 CLINICAL DATA:  Shortness of breath.  Hypotension and bradycardia. EXAM: PORTABLE CHEST 1 VIEW COMPARISON:  Chest radiographs 08/15/2021, 08/07/2021, 07/07/2021 FINDINGS: Cardiac silhouette is again moderately enlarged. Moderate calcifications are again seen within the aortic arch. Resolution of the prior heterogeneous airspace opacities within the right upper lobe and right lower lobe. Resolution of the prior bilateral pleural effusions. No pneumothorax. No acute skeletal abnormality. IMPRESSION: Resolution of the prior heterogeneous airspace opacities within the right upper lobe and right lower lobe. Resolution of the prior bilateral pleural effusions. Electronically Signed   By: Yvonne Kendall M.D.   On: 06/27/2022 12:53    Microbiology: Results for orders placed or performed during the hospital encounter of 06/27/22  Culture, blood (routine x 2)     Status: None   Collection Time: 06/27/22 11:59 AM   Specimen: BLOOD RIGHT HAND  Result Value Ref Range Status   Specimen Description   Final    BLOOD RIGHT HAND Performed at [facility] [address], [place], [place] [address]    Special Requests   Final    BOTTLES DRAWN AEROBIC AND ANAEROBIC Blood Culture adequate volume Performed at [facility] [address], [place], [place] [address]    Culture   Final    NO GROWTH 5 DAYS Performed at [facility], [place] [address], [place], [place] [address]    Report Status 07/02/2022 FINAL  Final  Culture, blood (routine x 2)     Status: None   Collection Time: 06/27/22 12:05 PM   Specimen: BLOOD RIGHT ARM  Result Value Ref Range Status   Specimen Description   Final    BLOOD RIGHT ARM Performed at [facility] [address], [place], [place] [address]    Special Requests   Final    BOTTLES DRAWN AEROBIC AND ANAEROBIC Blood Culture adequate  volume Performed at [facility] [address], [place], [place] [address]    Culture   Final    NO GROWTH 5 DAYS Performed at [facility], [place] [address], [place], [place] [address]    Report Status 07/02/2022 FINAL  Final  MRSA Next Gen by PCR, Nasal  Status: None   Collection Time: 06/27/22  9:22 PM   Specimen: Nasal Mucosa; Nasal Swab  Result Value Ref Range Status   MRSA by PCR Next Gen NOT DETECTED NOT DETECTED Final    Comment: (NOTE) The GeneXpert MRSA Assay (FDA approved for NASAL specimens only), is one component of a comprehensive MRSA colonization surveillance program. It is not intended to diagnose MRSA infection nor to guide or monitor treatment for MRSA infections. Test performance is not FDA approved in patients less than 62 years old. Performed at [facility], [place] [address] [place], [place] [address]     Labs: CBC: Recent Labs  Lab 06/27/22 1159 06/28/22 0431  WBC 8.9 7.4  NEUTROABS 6.0  --   HGB 10.0* 9.0*  HCT 32.7* 30.3*  MCV 86.1 87.6  PLT 264 [id number]   Basic Metabolic Panel: Recent Labs  Lab 06/27/22 1159 06/28/22 0431 06/29/22 0444 06/30/22 0430 07/03/22 1016  NA 130* 138  --  136 136  K 4.3 3.8  --  3.8 4.0  CL 98 109  --  107 103  CO2 21* 22  --  22 25  GLUCOSE 98 90  --  127* 132*  BUN 53* 42*  --  24* 23  CREATININE 1.55* 1.19* 0.87 0.80 0.76  CALCIUM 8.7* 8.2*  --  8.2* 8.0*   Liver Function Tests: Recent Labs  Lab 06/27/22 1159 06/28/22 0431  AST 18 14*  ALT 10 9  ALKPHOS 88 71  BILITOT 0.5 0.6  PROT 7.4 5.9*  ALBUMIN 3.9 3.0*   CBG: No results for input(s): "GLUCAP" in the last 168 hours.  Discharge time spent: greater than 30 minutes.  Signed: Elmarie Shiley, MD [facility] 07/03/2022

## 2022-07-03 NOTE — TOC Progression Note (Signed)
Transition of Care [facility]) - Progression Note    Patient Details  Name: [name] MRN: [id number] Date of Birth: 1941/03/11  Transition of Care [facility]) CM/SW Contact  Purcell Mouton, RN Phone Number: 07/03/2022, 12:01 PM  Clinical Narrative:     [facility] was also called spoke with CSW. [facility] was called, RN and [facility] are aware.   Expected Discharge Plan:  ([place]) Barriers to Discharge: Continued Medical Work up  Expected Discharge Plan and Services   Discharge Planning Services: CM Consult   Living arrangements for the past 2 months: [place] Expected Discharge Date: 07/03/22                                     Social Determinants of Health (SDOH) Interventions SDOH Screenings   Food Insecurity: No Food Insecurity (06/27/2022)  Housing: Low Risk  (06/27/2022)  Transportation Needs: No Transportation Needs (06/27/2022)  Utilities: Not At Risk (06/27/2022)  Tobacco Use: Medium Risk (06/27/2022)    Readmission Risk Interventions    08/11/2021   10:29 AM 08/09/2021    2:34 PM  Readmission Risk Prevention Plan  Transportation Screening Complete Complete  PCP or Specialist Appt within 3-5 Days Complete   HRI or [place] Complete Complete  Social Work Consult for [place] Planning/Counseling Complete Complete  Palliative Care Screening Not Applicable Not Applicable  Medication Review [employer] photographer) Complete Complete

## 2022-07-03 NOTE — Plan of Care (Signed)
  Problem: Clinical Measurements: Goal: Will remain free from infection Outcome: Progressing Goal: Diagnostic test results will improve Outcome: Progressing   Problem: Pain Managment: Goal: General experience of comfort will improve Outcome: Progressing   Problem: Skin Integrity: Goal: Risk for impaired skin integrity will decrease Outcome: Progressing

## 2022-07-06 ENCOUNTER — Encounter (HOSPITAL_BASED_OUTPATIENT_CLINIC_OR_DEPARTMENT_OTHER): Payer: Medicare Other | Admitting: General Surgery

## 2022-07-11 ENCOUNTER — Encounter (HOSPITAL_BASED_OUTPATIENT_CLINIC_OR_DEPARTMENT_OTHER): Payer: Medicare Other | Admitting: General Surgery

## 2022-07-11 DIAGNOSIS — Z7901 Long term (current) use of anticoagulants: Secondary | ICD-10-CM | POA: Diagnosis not present

## 2022-07-11 DIAGNOSIS — I10 Essential (primary) hypertension: Secondary | ICD-10-CM | POA: Diagnosis not present

## 2022-07-11 DIAGNOSIS — I48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 atrial fibrillation: Secondary | ICD-10-CM | POA: Diagnosis not present

## 2022-07-11 DIAGNOSIS — M1990 Unspecified osteoarthritis, unspecified site: Secondary | ICD-10-CM | POA: Diagnosis not present

## 2022-07-11 DIAGNOSIS — L97522 Non-pressure chronic ulcer of other part of left foot with fat layer exposed: Secondary | ICD-10-CM | POA: Diagnosis not present

## 2022-07-11 DIAGNOSIS — M341 CR(E)ST syndrome: Secondary | ICD-10-CM | POA: Diagnosis not present

## 2022-07-12 NOTE — Progress Notes (Addendum)
WIEDERHOLT, Brittley ([id number]) [id number] Page 1 of 9 Visit Report for 07/11/2022 Chief Complaint Document Details Patient Name: Date of Service: [name] RO N 07/11/2022 10:45 A M Medical Record Number: [id number] Patient Account Number: [id number] Date of Birth/Sex: Treating RN: [birth year] (82 y.o. F) Primary Care Provider: Jenne Pane, [facility] Other Clinician: Referring Provider: Treating Provider/Extender: Devoria Albe, [facility] Weeks in Treatment: 4 Information Obtained from: Patient Chief Complaint Patient seen for complaints of Non-Healing Wound. Electronic Signature(s) Signed: 07/11/2022 11:56:48 AM By: Fredirick Maudlin MD FACS Entered By: Fredirick Maudlin on 07/11/2022 11:56:48 -------------------------------------------------------------------------------- HPI Details Patient Name: Date of Service: [name], [facility] RO N 07/11/2022 10:45 A M Medical Record Number: [id number] Patient Account Number: [id number] Date of Birth/Sex: Treating RN: 10/20/1940 (82 y.o. F) Primary Care Provider: Jenne Pane, [facility] Other Clinician: Referring Provider: Treating Provider/Extender: Devoria Albe, [facility] Weeks in Treatment: 4 History of Present Illness HPI Description: ADMISSION 06/12/2022 This is an 82 year old woman with a history of CVA, crest syndrome, atrial fibrillation, rocker-bottom foot deformity. 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 She has been followed by podiatry for this and I am not entirely clear as to how she came to be referred here. She resides in an assisted living facility. It is not clear what they have been putting on her wound, but on intake, she was noted to have denuded skin on her medial third toe, as well as ulcers on her dorsal great toe and lateral great toe. There is slough accumulation on both of the toe ulcers. There was an odor noted at intake, but after her foot was  washed, the odor dissipated. Her toes are folded on top of each other creating areas of abrasion and pockets for moisture collection, which seems to be the primary cause of her ulceration. 06/20/2022: The skin between her toes and on the ball of her foot is completely macerated. There has been more tissue breakdown. The wound on her great toe has some slough accumulation. 06/27/2022: No change to her wounds today. There has been no further deterioration, but no significant improvement. She was both hypotensive and bradycardic on intake. 07/11/2022: Today, her foot is completely macerated. She reports that the wound care nurse actually soaked her foot and then applied foam, despite our specific orders to not use foam at all. She also is draining serous fluid from both legs and has 2+ pitting edema. She is on furosemide 20 mg twice a day. Electronic Signature(s) Signed: 07/11/2022 11:57:53 AM By: Fredirick Maudlin MD FACS Entered By: Fredirick Maudlin on 07/11/2022 11:57:53 -------------------------------------------------------------------------------- Physical Exam Details Patient Name: Date of Service: [name] [facility] RO N 07/11/2022 10:45 A M Medical Record Number: [id number] Patient Account Number: [id number] Date of Birth/Sex: Treating RN: [birth year] (82 y.o. F) Primary Care Provider: Jenne Pane, [facility] Other Clinician: Referring Provider: Treating Provider/Extender: Fredirick Maudlin SHO KES, [facility] Weeks in Treatment: 4 Constitutional . . . . no acute distress. [name], [name] ([id number]) [id number] Page 2 of 9 Respiratory Normal work of breathing on room air. Notes 07/11/2022: Today, her foot is completely macerated. She also is draining serous fluid from both legs and has 2+ pitting edema. Electronic Signature(s) Signed: 07/11/2022 11:58:54 AM By: Fredirick Maudlin MD FACS Entered By: Fredirick Maudlin on 07/11/2022  11:58:54 -------------------------------------------------------------------------------- Physician Orders Details Patient Name: Date of Service: [name], [facility] RO N 07/11/2022 10:45 A M Medical Record Number: [id number] Patient Account Number: [id number] Date of Birth/Sex: Treating  RN: 12/20/1940 (82 y.o. Harlow Ohms Primary Care Provider: Jenne Pane, 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 - Dr. Celine Ahr - room 2 Return A Other: - DO NOT USE FOAM BETWEEN TOES - LEAVE COMPRESSION WRAPS ON UNTIL NEXT WEEK, ONLY NEED TO CHANGE TOE DRESSING, PLEASE FOLLOW ORDERS ON WHAT TO USE Anesthetic (In clinic) Topical Lidocaine 4% applied to wound bed Bathing/ Shower/ Hygiene May shower and wash wound with soap and water. - keep toes and in between very dry Edema Control - Lymphedema / SCD / Other Elevate legs to the level of the heart or above for 30 minutes daily and/or when sitting for 3-4 times a day throughout the day. Non Wound Condition Other Non Wound Condition Orders/Instructions: - ZINC COFLEX WRAPS BILATERALLY - leave compression wraps on legs until next wound care appointment, only need to change the dressing on the toes. Wound Treatment Wound #1 - T Great oe Wound Laterality: Dorsal, Left Cleanser: Soap and Water 1 x Per Day/30 Days Discharge Instructions: May shower and wash  wound with dial antibacterial soap and water prior to dressing change. Cleanser: Wound Cleanser 1 x Per Day/30 Days Discharge Instructions: Cleanse the wound with wound cleanser prior to applying a clean dressing using gauze sponges, not tissue or cotton balls. Prim Dressing: Sorbalgon AG Dressing 2x2 (in/in) 1 x Per Day/30 Days ary Discharge Instructions: Apply to wound bed as instructed Secondary Dressing: Woven Gauze Sponge, Non-Sterile 4x4 in 1 x Per Day/30 Days Discharge Instructions: Apply over primary dressing as directed. Secured With: Scientist, forensic, Sterile 4x75 (in/in) 1 x Per Day/30 Days Discharge Instructions: Secure with stretch gauze as directed. Secured With: Transpore Surgical Tape, 2x10 (in/yd) 1 x Per Day/30 Days Discharge Instructions: Secure dressing with tape as directed. [name], [name] ([id number]) [id number] Page 3 of 9 Wound #2 - T Great oe Wound Laterality: Left, Lateral Cleanser: Soap and Water 1 x Per Day/30 Days Discharge Instructions: May shower and wash wound with dial antibacterial soap and water prior to dressing change. Cleanser: Wound Cleanser 1 x Per Day/30 Days Discharge Instructions: Cleanse the wound with wound cleanser prior to applying a clean dressing using gauze sponges, not tissue or cotton balls. Prim Dressing: Sorbalgon AG Dressing 2x2 (in/in) 1 x Per Day/30 Days ary Discharge Instructions: Apply to wound bed as instructed Secondary Dressing: Woven Gauze Sponge, Non-Sterile 4x4 in 1 x Per Day/30 Days Discharge Instructions: Apply over primary dressing as directed. Secured With: Scientist, forensic, Sterile 4x75 (in/in) 1 x Per Day/30 Days Discharge Instructions: Secure with stretch gauze as directed. Secured With: Transpore Surgical Tape, 2x10 (in/yd) 1 x Per Day/30 Days Discharge Instructions: Secure dressing with tape as directed. Wound #3 - T Third oe Wound Laterality:  Left, Medial Cleanser: Soap and Water 1 x Per Day/30 Days Discharge Instructions: May shower and wash wound with dial antibacterial soap and water prior to dressing change. Cleanser: Wound Cleanser 1 x Per Day/30 Days Discharge Instructions: Cleanse the wound  with wound cleanser prior to applying a clean dressing using gauze sponges, not tissue or cotton balls. Prim Dressing: Sorbalgon AG Dressing 2x2 (in/in) 1 x Per Day/30 Days ary Discharge Instructions: Apply to wound bed as instructed Secondary Dressing: Woven Gauze Sponge, Non-Sterile 4x4 in 1 x Per Day/30 Days Discharge Instructions: Apply over primary dressing as directed. Secured With: Scientist, forensic, Sterile 4x75 (in/in) 1 x Per Day/30 Days Discharge Instructions: Secure with stretch gauze as directed. Secured With: Transpore Surgical Tape, 2x10 (in/yd) 1 x Per Day/30 Days Discharge Instructions: Secure dressing with tape as directed. Wound #4 - Foot Wound Laterality: Plantar, Left Cleanser: Soap and Water 1 x Per Day/30 Days Discharge Instructions: May shower and wash wound with dial antibacterial soap and water prior to dressing change. Cleanser: Wound Cleanser 1 x Per Day/30 Days Discharge Instructions: Cleanse the wound with wound cleanser prior to applying a clean dressing using gauze sponges, not tissue or cotton balls. Prim Dressing: Sorbalgon AG Dressing 2x2 (in/in) 1 x Per Day/30 Days ary Discharge Instructions: Apply to wound bed as instructed Secondary Dressing: Woven Gauze Sponge, Non-Sterile 4x4 in 1 x Per Day/30 Days Discharge Instructions: Apply over primary dressing as directed. Secured With: Scientist, forensic, Sterile 4x75 (in/in) 1 x Per Day/30 Days Discharge Instructions: Secure with stretch gauze as directed. Secured With: Transpore Surgical Tape, 2x10 (in/yd) 1 x Per Day/30 Days Discharge Instructions: Secure dressing with tape as directed. Patient  Medications llergies: Sulfa (Sulfonamide Antibiotics) A Notifications Medication Indication Start End 07/11/2022 lidocaine DOSE topical 4 % cream - cream topical Electronic Signature(s) Signed: 07/11/2022 3:48:41 PM By: Fredirick Maudlin MD FACS Entered By: Fredirick Maudlin on 07/11/2022 11:59:44 [name], [name] ([id number]) [id number] Page 4 of 9 -------------------------------------------------------------------------------- Problem List Details Patient Name: Date of Service: [name] 07/11/2022 10:45 A M Medical Record Number: [id number] Patient Account Number: [id number] Date of Birth/Sex: Treating RN: 1941/01/19 (82 y.o. 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 06/12/2022 No Yes 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 06/12/2022 No Yes 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  F) Primary Care Provider: Jenne Pane, [facility] Other Clinician: Referring Provider: Treating Provider/Extender: Devoria Albe, [facility] Weeks in Treatment: 4 Subjective Chief Complaint Information obtained from Patient Patient seen for complaints of Non-Healing Wound. History of Present Illness (HPI) ADMISSION 06/12/2022 This is an 82 year old woman with a history of CVA, crest syndrome, atrial fibrillation, rocker-bottom foot deformity. She apparently developed an ulcer on her [name], [name] ([id number]) [id number] Page 5 of 9 left great toe secondary to her AFO prosthetic. She has been followed by podiatry for this and I am not entirely clear  as to how she came to be referred here. She resides in an assisted living facility. It is not clear what they have been putting on her wound, but on intake, she was noted to have denuded skin on her medial third toe, as well as ulcers on her dorsal great toe and lateral great toe. There is slough accumulation on both of the toe ulcers. There was an odor noted at intake, but after her foot was washed, the odor dissipated. Her toes are folded on top of each other creating areas of abrasion and pockets for moisture collection, which seems to be the primary cause of her ulceration. 06/20/2022: The skin between her toes and on the ball of her foot is completely macerated. There has been more tissue breakdown. The wound on her great toe has some slough accumulation. 06/27/2022: No change to her wounds today. There has been no further deterioration, but no significant improvement. She was both hypotensive and bradycardic on intake. 07/11/2022: Today, her foot is completely macerated. She reports that the wound care nurse actually soaked her foot and then applied foam, despite our specific orders to not use foam at all. She also is draining serous fluid from both legs and has 2+ pitting edema. She is on furosemide 20 mg twice a  day. Patient History Information obtained from Patient. Family History Unknown History. Social History Former smoker - smoked when she was young, Marital Status - Widowed, Alcohol Use - Never, Drug Use - No History, Caffeine Use - Daily. Medical History Hematologic/Lymphatic Patient has history of Anemia Cardiovascular Patient has history of Angina - a-fib, Hypertension, Vasculitis Endocrine Denies history of Type I Diabetes, Type II Diabetes Musculoskeletal Patient has history of Osteoarthritis Hospitalization/Surgery History - cholecystectomy. - leg surgery. - tonsillectomy. 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 Vitals Time Taken: 10:59 AM, Height: 67 in, Weight: 153 lbs, BMI: 24, Temperature: 97.9 F, Pulse: 96 bpm, Respiratory Rate: 20 breaths/min, Blood Pressure: 133/76 mmHg. Respiratory Normal work of breathing on room air. General Notes: 07/11/2022: Today, her foot is completely macerated. She also is draining serous fluid from both legs and has 2+ pitting edema. Integumentary (Hair, Skin) Wound #1 status is Open. Original cause of wound was Footwear Injury. The date acquired was: 05/24/2021. The wound has been in treatment 4 weeks. The wound is located on the Left,Dorsal T Great. The wound measures 0.5cm length x 0.8cm width x 0.1cm depth; 0.314cm^2 area and 0.031cm^3 volume. There oe is Fat Layer (Subcutaneous Tissue) exposed. There is no tunneling or undermining noted. There is a medium amount of serous drainage noted. The wound margin is distinct with the outline attached to the wound base. There is small (1-33%) red granulation within the wound bed. There is a large (67-100%) amount of necrotic tissue within the wound bed including Adherent Slough. The periwound skin appearance had no abnormalities noted for texture.  The periwound skin appearance exhibited: Dry/Scaly, Maceration, Rubor. Periwound temperature was noted as No Abnormality. Wound #2 status is Open. Original cause of wound was Shear/Friction. The date acquired was: 05/24/2021. The wound has been in treatment 4 weeks. The wound is located on the [employer]. The wound measures 0.7cm length x 1cm width x 0.1cm depth; 0.55cm^2 area and 0.055cm^3 volume. There is Fat oe Layer (Subcutaneous Tissue) exposed. There is no tunneling or undermining noted. There is a medium amount of serous drainage noted. The wound  margin is distinct with the outline attached to the wound base. There is small (1-33%) red granulation within the wound bed. There is a large (67-100%) amount of necrotic tissue within the wound bed including Adherent Slough. The periwound skin appearance had no abnormalities noted for texture. The periwound skin appearance exhibited: Maceration, Rubor. Periwound temperature was noted as No Abnormality. Wound #3 status is Open. Original cause of wound was Shear/Friction. The date acquired was: 05/24/2021. The wound has been in treatment 4 weeks. The wound is located on the Left,Medial T Third. The wound measures 0.5cm length x 1cm width x 0.1cm depth; 0.393cm^2 area and 0.039cm^3 volume. There is Fat oe Layer (Subcutaneous Tissue) exposed. There is no tunneling or undermining noted. There is a medium amount of serous drainage noted. The wound margin is distinct with the outline attached to the wound base. There is large (67-100%) red granulation within the wound bed. There is a small (1-33%) amount of necrotic [name], [name] ([id number]) [id number] Page 6 of 9 tissue within the wound bed including Adherent Slough. The periwound skin appearance had no abnormalities noted for texture. The periwound skin appearance exhibited: Maceration, Rubor. Periwound temperature was noted as No Abnormality. Wound #4 status is  Open. Original cause of wound was Skin T ear/Laceration. The date acquired was: 06/20/2022. The wound has been in treatment 3 weeks. The wound is located on the [place]. The wound measures 0cm length x 0cm width x 0cm depth; 0cm^2 area and 0cm^3 volume. There is no tunneling or undermining noted. There is a none present amount of drainage noted. The wound margin is distinct with the outline attached to the wound base. There is no granulation within the wound bed. There is no necrotic tissue within the wound bed. The periwound skin appearance had no abnormalities noted for texture. The periwound skin appearance had no abnormalities noted for moisture. The periwound skin appearance exhibited: Rubor. Periwound temperature was noted as No Abnormality. 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 Post procedure Diagnosis Wound #: Same as Pre-Procedure Plan Follow-up Appointments: Return Appointment in 1 week. - Dr. Celine Ahr - room 2 Other: - DO NOT USE FOAM BETWEEN TOES - LEAVE COMPRESSION WRAPS ON UNTIL NEXT WEEK, ONLY NEED TO CHANGE TOE DRESSING, PLEASE FOLLOW ORDERS ON WHAT TO USE Anesthetic: (In clinic) Topical Lidocaine 4% applied to wound bed Bathing/ Shower/ Hygiene: May shower and wash wound with soap and water. - keep toes and in between very dry Edema Control - Lymphedema / SCD / Other: Elevate legs to the level of the heart or above for 30 minutes daily and/or when sitting for 3-4 times a day throughout the day. Non Wound Condition: Other Non  Wound Condition Orders/Instructions: - ZINC COFLEX WRAPS BILATERALLY - leave compression wraps on legs until next wound care appointment, only need to change the dressing on the toes. The following medication(s) was prescribed: lidocaine topical 4 % cream cream topical was prescribed at facility WOUND #1: - T Great Wound Laterality: Dorsal, Left oe Cleanser: Soap and Water 1 x Per Day/30 Days Discharge Instructions: May shower and wash wound with dial antibacterial soap and water  prior to dressing change. Cleanser: Wound Cleanser 1 x Per Day/30 Days Discharge Instructions: Cleanse the wound with wound cleanser prior to applying a clean dressing using gauze sponges, not tissue or cotton balls. Prim Dressing: Sorbalgon AG Dressing 2x2 (in/in) 1 x Per Day/30 Days ary Discharge Instructions: Apply to wound bed as instructed Secondary Dressing: Woven Gauze Sponge, Non-Sterile 4x4 in 1 x Per Day/30 Days Discharge Instructions: Apply over primary dressing as directed. Secured With: Scientist, forensic, Sterile 4x75 (in/in) 1 x Per Day/30 Days Discharge Instructions: Secure with stretch gauze as directed. Secured With: Transpore Surgical T ape, 2x10 (in/yd) 1 x Per Day/30 Days Discharge Instructions: Secure dressing with tape as directed. WOUND #2: - T Great Wound Laterality: Left, Lateral oe Cleanser: Soap and Water 1 x Per Day/30 Days Discharge Instructions: May shower and wash wound with dial antibacterial soap and water prior to dressing change. Cleanser: Wound Cleanser 1 x Per Day/30 Days Discharge Instructions: Cleanse the wound with wound cleanser prior to applying a clean dressing using gauze sponges, not tissue or cotton balls. Prim Dressing: Sorbalgon AG Dressing 2x2 (in/in) 1 x Per Day/30 Days ary Discharge Instructions: Apply to wound bed as instructed Secondary Dressing: Woven Gauze Sponge, Non-Sterile 4x4 in 1 x Per Day/30 Days Discharge Instructions: Apply over  primary dressing as directed. Secured With: Scientist, forensic, Sterile 4x75 (in/in) 1 x Per Day/30 Days Discharge Instructions: Secure with stretch gauze as directed. Secured With: Transpore Surgical T ape, 2x10 (in/yd) 1 x Per Day/30 Days Discharge Instructions: Secure dressing with tape as directed. WOUND #3: - T Third Wound Laterality: Left, Medial oe Cleanser: Soap and Water 1 x Per Day/30 Days Discharge Instructions: May shower and wash wound with dial antibacterial soap and water prior to dressing change. Cleanser: Wound Cleanser 1 x Per Day/30 Days [name], [name] ([id number]) [id number] Page 7 of 9 Discharge Instructions: Cleanse the wound with wound cleanser prior to applying a clean dressing using gauze sponges, not tissue or cotton balls. Prim Dressing: Sorbalgon AG Dressing 2x2 (in/in) 1 x Per Day/30 Days ary Discharge Instructions: Apply to wound bed as instructed Secondary Dressing: Woven Gauze Sponge, Non-Sterile 4x4 in 1 x Per Day/30 Days Discharge Instructions: Apply over primary dressing as directed. Secured With: Scientist, forensic, Sterile 4x75 (in/in) 1 x Per Day/30 Days Discharge Instructions: Secure with stretch gauze as directed. Secured With: Transpore Surgical Tape, 2x10 (in/yd) 1 x Per Day/30 Days Discharge Instructions: Secure dressing with tape as directed. WOUND #4: - Foot Wound Laterality: Plantar, Left Cleanser: Soap and Water 1 x Per Day/30 Days Discharge Instructions: May shower and wash wound with dial antibacterial soap and water prior to dressing change. Cleanser: Wound Cleanser 1 x Per Day/30 Days Discharge Instructions: Cleanse the wound with wound cleanser prior to applying a clean dressing using gauze sponges, not tissue or cotton balls. Prim Dressing: Sorbalgon AG Dressing 2x2 (in/in) 1 x Per Day/30 Days ary Discharge Instructions: Apply to wound bed as instructed Secondary  Dressing: Woven Gauze Sponge, Non-Sterile 4x4 in 1 x Per Day/30 Days Discharge Instructions: Apply over primary dressing as directed. Secured With: Scientist, forensic, Sterile 4x75 (in/in) 1 x Per Day/30 Days Discharge Instructions: Secure with stretch gauze as directed. Secured With: Transpore Surgical Tape, 2x10 (in/yd) 1 x Per Day/30 Days Discharge Instructions: Secure dressing with tape as directed. 07/11/2022: Today, her foot is completely macerated. She reports that the wound care nurse actually soaked her foot  and then applied foam, despite our specific orders to not use foam at all. She also is draining serous fluid from both legs and has 2+ pitting edema. No debridement was necessary today. We are going to apply bilateral zinc Coflex wraps to her legs. We will apply liberal amounts of zinc oxide to her foot and put silver alginate on her wounds in between her toes. Very explicit instructions were written on the paperwork that came with the patient to clinic and the aide that came with her today also indicated that she would report back to her facility regarding the need to follow our instructions correctly. She will follow-up in 1 week. Electronic Signature(s) Signed: 07/11/2022 12:01:00 PM By: Fredirick Maudlin MD FACS Entered By: Fredirick Maudlin on 07/11/2022 12:01:00 -------------------------------------------------------------------------------- HxROS Details Patient Name: Date of Service: [name], [facility] RO N 07/11/2022 10:45 A M Medical Record Number: [id number] Patient Account Number: [id number] Date of Birth/Sex: Treating RN: 01-Aug-1940 (82 y.o. 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 Endocrine Medical History: Negative for: Type I Diabetes; Type II Diabetes Past Medical History Notes: hypothyroidism Musculoskeletal Medical History: Positive for: 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 [name], [name] ([id number]) [id number] Page 8 of 9 Neurologic Medical History: Past Medical History Notes: stroke Immunizations Pneumococcal Vaccine: Received Pneumococcal Vaccination: Yes Received Pneumococcal Vaccination On or After 60th Birthday: Yes Implantable Devices None Hospitalization / Surgery History Type of Hospitalization/Surgery cholecystectomy leg surgery tonsillectomy Family and Social History Unknown History: Yes; Former smoker - smoked when she was young; Marital Status - Widowed; Alcohol Use: Never; Drug Use: No History; Caffeine Use: Daily; Financial Concerns: No; Food, Clothing or Shelter Needs: No; Support System Lacking: No; Transportation Concerns: No Engineer, maintenance) Signed: 07/11/2022 3:48:41 PM By: Fredirick Maudlin MD FACS Entered By: Fredirick Maudlin on 07/11/2022 11:58:15 -------------------------------------------------------------------------------- SuperBill Details Patient Name: Date of Service: [name] [facility] RO N 07/11/2022 Medical Record Number: [id number] Patient Account Number: [id number] Date of Birth/Sex: Treating RN: [birth year] (82 y.o. 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 ICD-10 Diagnosis Description L97.522 Non-pressure chronic ulcer of other part of left foot with fat layer exposed L97.521  Non-pressure chronic ulcer of other part of left foot limited to breakdown of skin Modifier: Quantity: 1 Physician Procedures : CPT4 Code Description Modifier [id number] - WC PHYS LEVEL 3 - EST PT 25 ICD-10 Diagnosis Description L97.522 Non-pressure chronic ulcer of other part of left foot with fat layer exposed L97.521 Non-pressure chronic ulcer of other part of left foot  limited to breakdown of skin [name], [name] ([id number]) [id number] Page 9 Quantity: 1 of 9 Electronic Signature(s) Signed: 07/16/2022 1:41:19 PM By: Lonell Face  Signed: 07/16/2022 2:38:29 PM By: Fredirick Maudlin MD FACS Previous Signature: 07/11/2022 12:01:55 PM Version By: Fredirick Maudlin MD FACS Entered By: Lonell Face on 07/16/2022 13:41:18

## 2022-07-12 NOTE — Progress Notes (Signed)
ORLOFF, Ingrid ([id number]) [id number] Page 1 of 13 Visit Report for 07/11/2022 Arrival Information Details Patient Name: Date of Service: Meredith Staggers RO N 07/11/2022 10:45 A M Medical Record Number: [id number] Patient Account Number: [id number] Date of Birth/Sex: Treating RN: [birth year] (82 y.o. F) Primary Care Connor Meacham: Jenne Pane, [facility] Other Clinician: Referring Jashae Wiggs: Treating Carrington Mullenax/Extender: Devoria Albe, [facility] Weeks in Treatment: 4 Visit Information History Since Last Visit All ordered tests and consults were completed: No Patient Arrived: Wheel Chair Added or deleted any medications: No Arrival Time: 10:56 Any new allergies or adverse reactions: No Accompanied By: aide Had a fall or experienced change in No Transfer Assistance: Manual activities of daily living that may affect Patient Identification Verified: Yes risk of falls: Secondary Verification Process Completed: Yes Signs or symptoms of abuse/neglect since last visito No Hospitalized since last visit: No Implantable device outside of the clinic excluding No cellular tissue based products placed in the center since last visit: Pain Present Now: No Electronic Signature(s) Signed: 07/11/2022 12:45:59 PM By: Worthy Rancher Entered By: Worthy Rancher on 07/11/2022 10:56:58 -------------------------------------------------------------------------------- Compression Therapy Details Patient Name: Date of Service: Danton Clap [facility] RO N 07/11/2022 10:45 A M Medical Record Number: [id number] Patient Account Number: [id number] Date of Birth/Sex: Treating RN: 12/30/1940 (82 y.o. Harlow Ohms Primary Care Jolea Dolle: Jenne Pane, [facility] Other Clinician: Referring Harce Volden: Treating Cameron Schwinn/Extender: Fredirick Maudlin SHO Carlynn Spry, [facility] Weeks in Treatment: 4 Compression Therapy Performed for Wound Assessment: NonWound Condition Lymphedema - Bilateral Leg Performed By: Clinician Adline Peals, RN Compression Type: Double Layer Post Procedure Diagnosis Same as Pre-procedure Electronic Signature(s) Signed: 07/11/2022 4:37:09 PM By: Sabas Sous By: Adline Peals on 07/11/2022 11:22:07 -------------------------------------------------------------------------------- Encounter Discharge Information Details Patient Name: Date of Service: Danton Clap [facility] RO N 07/11/2022 10:45 A M Medical Record Number: [id number] Patient Account Number: [id number] Date of Birth/Sex: Treating RN: Mar 01, 1941 (82 y.o. Harlow Ohms Primary Care Katlin Ciszewski: Jenne Pane, [facility] Other Clinician: Referring Tisha Cline: Treating Ji Feldner/Extender: Devoria Albe, [facility] Weeks in Treatment: 4 Encounter Discharge Information Items Discharge Condition: Stable Ambulatory Status: Wheelchair Discharge Destination: [place] Telephoned: No Orders SentMahogani Kleiber, Trannie ([id number]) [id number] Page 2 of 13 Transportation: Private Auto Accompanied By: caregiver Schedule Follow-up Appointment: Yes Clinical Summary of Care: Patient Declined Electronic Signature(s) Signed: 07/11/2022 4:37:09 PM By: Adline Peals Entered By: Adline Peals on 07/11/2022 11:42:09 -------------------------------------------------------------------------------- Lower Extremity Assessment Details Patient Name: Date of Service: Meredith Staggers RO N 07/11/2022 10:45 A M Medical Record Number: [id number] Patient Account Number: [id number] Date of Birth/Sex: Treating RN: 1940-08-26 (82 y.o. Harlow Ohms Primary Care Lillia Lengel: Jenne Pane, [facility] Other Clinician: Referring Shiane Wenberg: Treating Vertis Scheib/Extender: Fredirick Maudlin SHO KES, [facility] Weeks in Treatment: 4 Edema Assessment Assessed: [Left: No] [Right: No] [Left: Edema] [Right: :] Calf Left: Right: Point of Measurement: From Medial Instep 37 cm Ankle Left: Right: Point of Measurement:  From Medial Instep 24.5 cm Electronic Signature(s) Signed: 07/11/2022 4:37:09 PM By: Adline Peals Entered By: Adline Peals on 07/11/2022 11:07:13 -------------------------------------------------------------------------------- Multi Wound Chart Details Patient Name: Date of Service: Danton Clap, [facility] RO N 07/11/2022 10:45 A M Medical Record Number: [id number] Patient Account Number: [id number] Date of Birth/Sex: Treating RN: 1941/04/16 (82 y.o. F) Primary Care Montford Barg: Jenne Pane, [facility] Other Clinician: Referring Tishawn Friedhoff: Treating Marnisha Stampley/Extender: Devoria Albe, [facility] Weeks in Treatment: 4 Vital Signs Height(in): 67 Pulse(bpm): 96 Weight(lbs): 153 Blood Pressure(mmHg): 133/76 Body Mass Index(BMI): 24 Temperature(F): 97.9 Respiratory Rate(breaths/min): 20 [1:Photos:] Left, Dorsal T Doristine Devoid  oe Left, Lateral T Great oe Left, Medial T Third oe Wound Location: Footwear Injury Shear/Friction Shear/Friction Wounding Event: Abrasion Abrasion Abrasion Primary EtiologyKathren Char, Elyce ([id number]) [id number] Page 3 of 13 Anemia, Angina, Hypertension, Anemia, Angina, Hypertension, Anemia, Angina, Hypertension, Comorbid History: Vasculitis, Osteoarthritis Vasculitis, Osteoarthritis Vasculitis, Osteoarthritis 05/24/2021 05/24/2021 05/24/2021 Date Acquired: 4 4 4  Weeks of Treatment: Open Open Open Wound Status: No No No Wound Recurrence: 0.5x0.8x0.1 0.7x1x0.1 0.5x1x0.1 Measurements L x W x D (cm) 0.314 0.55 0.393 A (cm) : rea 0.031 0.055 0.039 Volume (cm) : 28.60% 75.40% 28.50% % Reduction in Area: 29.50% 75.40% 29.10% % Reduction in Volume: 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 Classification: Support Structures Support Structures Support Structures Medium Medium Medium Exudate Amount: Serous Serous Serous Exudate Type: Geophysical data processor Exudate Color: Distinct, outline  attached Distinct, outline attached Distinct, outline attached Wound Margin: Small (1-33%) Small (1-33%) Large (67-100%) Granulation Amount: Red Red Red Granulation Quality: Large (67-100%) Large (67-100%) Small (1-33%) Necrotic Amount: Fat Layer (Subcutaneous Tissue): Yes Fat Layer (Subcutaneous Tissue): Yes Fat Layer (Subcutaneous Tissue): Yes Exposed Structures: Fascia: No Fascia: No Fascia: No Tendon: No Tendon: No Tendon: No Muscle: No Muscle: No Muscle: No Joint: No Joint: No Joint: No Bone: No Bone: No Bone: No None None None Epithelialization: No Abnormalities Noted No Abnormalities Noted No Abnormalities Noted Periwound Skin Texture: Maceration: Yes Maceration: Yes Maceration: Yes Periwound Skin Moisture: Dry/Scaly: Yes Rubor: Yes Rubor: Yes Rubor: Yes Periwound Skin Color: No Abnormality No Abnormality No Abnormality Temperature: Wound Number: 4 N/A N/A Photos: N/A N/A Left, Plantar Foot N/A N/A Wound Location: Skin T ear/Laceration N/A N/A Wounding Event: Skin T ear N/A N/A Primary Etiology: Anemia, Angina, Hypertension, N/A N/A Comorbid History: Vasculitis, Osteoarthritis 06/20/2022 N/A N/A Date Acquired: 3 N/A N/A Weeks of Treatment: Open N/A N/A Wound Status: No N/A N/A Wound Recurrence: 0x0x0 N/A N/A Measurements L x W x D (cm) 0 N/A N/A A (cm) : rea 0 N/A N/A Volume (cm) : 100.00% N/A N/A % Reduction in Area: 100.00% N/A N/A % Reduction in Volume: 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 N/A N/A Classification: Support Structures None Present N/A N/A Exudate Amount: N/A N/A N/A Exudate Type: N/A N/A N/A Exudate Color: Distinct, outline attached N/A N/A Wound Margin: None Present (0%) N/A N/A Granulation Amount: N/A N/A N/A Granulation Quality: None Present (0%) N/A N/A Necrotic Amount: Fascia: No N/A N/A Exposed Structures: Fat Layer (Subcutaneous Tissue): No Tendon: No Muscle: No Joint: No Bone: No Large (67-100%)  N/A N/A Epithelialization: No Abnormalities Noted N/A N/A Periwound Skin Texture: Maceration: Yes N/A N/A Periwound Skin Moisture: Dry/Scaly: Yes Rubor: Yes N/A N/A Periwound Skin Color: No Abnormality N/A N/A Temperature: Treatment Notes Wound #1 (Toe Great) Wound Laterality: Dorsal, Left Cleanser Ginty, Addalie ([id number]) [id number] Page 4 of 13 Soap and Water Discharge Instruction: May shower and wash wound with dial antibacterial soap and water prior to dressing change. Wound Cleanser Discharge Instruction: Cleanse the wound with wound cleanser prior to applying a clean dressing using gauze sponges, not tissue or cotton balls. Peri-Wound Care Topical Primary Dressing Sorbalgon AG Dressing 2x2 (in/in) Discharge Instruction: Apply to wound bed as instructed Secondary Dressing Woven Gauze Sponge, Non-Sterile 4x4 in Discharge Instruction: Apply over primary dressing as directed. Secured With Conforming Stretch Gauze Bandage Roll, Sterile 4x75 (in/in) Discharge Instruction: Secure with stretch gauze as directed. Transpore Surgical Tape, 2x10 (in/yd) Discharge Instruction: Secure dressing with tape as directed. Compression Wrap Compression Stockings Add-Ons Wound #2 (  Toe Great) Wound Laterality: Left, Lateral Cleanser Soap and Water Discharge Instruction: May shower and wash wound with dial antibacterial soap and water prior to dressing change. Wound Cleanser Discharge Instruction: Cleanse the wound with wound cleanser prior to applying a clean dressing using gauze sponges, not tissue or cotton balls. Peri-Wound Care Topical Primary Dressing Sorbalgon AG Dressing 2x2 (in/in) Discharge Instruction: Apply to wound bed as instructed Secondary Dressing Woven Gauze Sponge, Non-Sterile 4x4 in Discharge Instruction: Apply over primary dressing as directed. Secured With Conforming Stretch Gauze Bandage Roll, Sterile 4x75 (in/in) Discharge  Instruction: Secure with stretch gauze as directed. Transpore Surgical Tape, 2x10 (in/yd) Discharge Instruction: Secure dressing with tape as directed. Compression Wrap Compression Stockings Add-Ons Wound #3 (Toe Third) Wound Laterality: Left, Medial Cleanser Soap and Water Discharge Instruction: May shower and wash wound with dial antibacterial soap and water prior to dressing change. Wound Cleanser Discharge Instruction: Cleanse the wound with wound cleanser prior to applying a clean dressing using gauze sponges, not tissue or cotton balls. Peri-Wound Care Topical Primary Dressing Sorbalgon AG Dressing 2x2 (in/in) [place], [place] ([id number]) [id number] Page 5 of 13 Discharge Instruction: Apply to wound bed as instructed Secondary Dressing Woven Gauze Sponge, Non-Sterile 4x4 in Discharge Instruction: Apply over primary dressing as directed. Secured With Conforming Stretch Gauze Bandage Roll, Sterile 4x75 (in/in) Discharge Instruction: Secure with stretch gauze as directed. Transpore Surgical Tape, 2x10 (in/yd) Discharge Instruction: Secure dressing with tape as directed. Compression Wrap Compression Stockings Add-Ons Wound #4 (Foot) Wound Laterality: Plantar, Left Cleanser Soap and Water Discharge Instruction: May shower and wash wound with dial antibacterial soap and water prior to dressing change. Wound Cleanser Discharge Instruction: Cleanse the wound with wound cleanser prior to applying a clean dressing using gauze sponges, not tissue or cotton balls. Peri-Wound Care Topical Primary Dressing Sorbalgon AG Dressing 2x2 (in/in) Discharge Instruction: Apply to wound bed as instructed Secondary Dressing Woven Gauze Sponge, Non-Sterile 4x4 in Discharge Instruction: Apply over primary dressing as directed. Secured With Conforming Stretch Gauze Bandage Roll, Sterile 4x75 (in/in) Discharge Instruction: Secure with stretch gauze as  directed. Transpore Surgical Tape, 2x10 (in/yd) Discharge Instruction: Secure dressing with tape as directed. Compression Wrap Compression Stockings Add-Ons Electronic Signature(s) Signed: 07/11/2022 11:53:10 AM By: Fredirick Maudlin MD FACS Entered By: Fredirick Maudlin on 07/11/2022 11:53:10 -------------------------------------------------------------------------------- Multi-Disciplinary Care Plan Details Patient Name: Date of Service: Danton Clap, [facility] RO N 07/11/2022 10:45 A M Medical Record Number: [id number] Patient Account Number: [id number] Date of Birth/Sex: Treating RN: July 14, 1940 (82 y.o. Harlow Ohms Primary Care Jennilee Demarco: Jenne Pane, [facility] Other Clinician: Referring Amandine Covino: Treating Loye Reininger/Extender: Devoria Albe, [facility] Weeks in Treatment: 4 Active Inactive Necrotic Tissue Nursing Diagnoses: CHINAZA, SADRI ([id number]) [id number] Page 6 of 13 Impaired tissue integrity related to necrotic/devitalized tissue Knowledge deficit related to management of necrotic/devitalized tissue Goals: Necrotic/devitalized tissue will be minimized in the wound bed Date Initiated: 06/12/2022 Target Resolution Date: 07/27/2022 Goal Status: Active Patient/caregiver will verbalize understanding of reason and process for debridement of necrotic tissue Date Initiated: 06/12/2022 Target Resolution Date: 07/27/2022 Goal Status: Active Interventions: Assess patient pain level pre-, during and post procedure and prior to discharge Provide education on necrotic tissue and debridement process Treatment Activities: Apply topical anesthetic as ordered : 06/12/2022 Notes: Wound/Skin Impairment Nursing Diagnoses: Impaired tissue integrity Knowledge deficit related to ulceration/compromised skin integrity Goals: Patient/caregiver will verbalize understanding of skin care regimen Date Initiated: 06/12/2022 Target Resolution Date: 07/27/2022 Goal Status:  Active Interventions: Assess ulceration(s) every visit Treatment Activities: Skin care  regimen initiated : 06/12/2022 Topical wound management initiated : 06/12/2022 Notes: Electronic Signature(s) Signed: 07/11/2022 4:37:09 PM By: Adline Peals Entered By: Adline Peals on 07/11/2022 11:00:44 -------------------------------------------------------------------------------- Pain Assessment Details Patient Name: Date of Service: Danton Clap [facility] RO N 07/11/2022 10:45 A M Medical Record Number: [id number] Patient Account Number: [id number] Date of Birth/Sex: Treating RN: 03-13-1941 (82 y.o. F) Primary Care Luvia Orzechowski: Jenne Pane, [facility] Other Clinician: Referring Lavante Toso: Treating Joleen Stuckert/Extender: Devoria Albe, [facility] Weeks in Treatment: 4 Active Problems Location of Pain Severity and Description of Pain Patient Has Paino No Site Locations Rate the pain. BALUYOT, Onalee ([id number]) [id number] Page 7 of 13 Rate the pain. Current Pain Level: 3 Worst Pain Level: 10 Least Pain Level: 0 Tolerable Pain Level: 1 Pain Management and Medication Current Pain Management: Electronic Signature(s) Signed: 07/11/2022 12:45:59 PM By: Worthy Rancher Entered By: Worthy Rancher on 07/11/2022 11:02:08 -------------------------------------------------------------------------------- Patient/Caregiver Education Details Patient Name: Date of Service: Danton Clap, [facility] RO N 3/20/2024andnbsp10:45 A M Medical Record Number: [id number] Patient Account Number: [id number] Date of Birth/Gender: Treating RN: 11/17/1940 (82 y.o. Harlow Ohms Primary Care Physician: Jenne Pane, [facility] Other Clinician: Referring Physician: Treating Physician/Extender: Devoria Albe, [facility] Weeks in Treatment: 4 Education Assessment Education Provided To: Patient Education Topics Provided Wound/Skin Impairment: Methods: Explain/Verbal Responses: Reinforcements needed, State  content correctly Electronic Signature(s) Signed: 07/11/2022 4:37:09 PM By: Adline Peals Entered By: Adline Peals on 07/11/2022 11:00:54 -------------------------------------------------------------------------------- Wound Assessment Details Patient Name: Date of Service: Danton Clap [facility] RO N 07/11/2022 10:45 A M Medical Record Number: [id number] Patient Account Number: [id number] Date of Birth/Sex: Treating RN: [birth year] (82 y.o. F) Primary Care Oryan Winterton: Jenne Pane, [facility] Other Clinician: Referring Kylyn Sookram: Treating Burnell Hurta/Extender: Fredirick Maudlin SHO KES, [facility] Weeks in Treatment: 4 Wound Status Wound Number: 1 Primary Etiology: Abrasion Wound Location: Left, Dorsal T Great oe Wound Status: Open Wounding Event: Footwear Injury Comorbid History: Anemia, Angina, Hypertension, Vasculitis, Osteoarthritis Date Acquired: 05/24/2021 Weeks Of Treatment: 4 Clustered Wound: No Mcconico, Shanik ([id number]) [id number] Page 8 of 13 Photos Wound Measurements Length: (cm) 0.5 Width: (cm) 0.8 Depth: (cm) 0.1 Area: (cm) 0.314 Volume: (cm) 0.031 % Reduction in Area: 28.6% % Reduction in Volume: 29.5% Epithelialization: None Tunneling: No Undermining: No Wound Description Classification: 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 Support Structures Wound Margin: Distinct, outline attached Exudate Amount: Medium Exudate Type: Serous Exudate Color: amber Foul Odor After Cleansing: No Slough/Fibrino Yes Wound Bed Granulation Amount: Small (1-33%) Exposed Structure Granulation Quality: Red Fascia Exposed: No Necrotic Amount: Large (67-100%) Fat Layer (Subcutaneous Tissue) Exposed: Yes Necrotic Quality: Adherent Slough Tendon Exposed: No Muscle Exposed: No Joint Exposed: No Bone Exposed: No Periwound Skin Texture Texture Color No Abnormalities Noted: Yes No Abnormalities Noted: No Rubor: Yes Moisture No Abnormalities Noted: No Temperature /  Pain Dry / Scaly: Yes Temperature: No Abnormality Maceration: Yes Treatment Notes Wound #1 (Toe Great) Wound Laterality: Dorsal, Left Cleanser Soap and Water Discharge Instruction: May shower and wash wound with dial antibacterial soap and water prior to dressing change. Wound Cleanser Discharge Instruction: Cleanse the wound with wound cleanser prior to applying a clean dressing using gauze sponges, not tissue or cotton balls. Peri-Wound Care Topical Primary Dressing Sorbalgon AG Dressing 2x2 (in/in) Discharge Instruction: Apply to wound bed as instructed Secondary Dressing Woven Gauze Sponge, Non-Sterile 4x4 in Discharge Instruction: Apply over primary dressing as directed. Secured With Conforming Stretch Gauze Bandage Roll, Sterile 4x75 (in/in) Discharge Instruction: Secure with stretch gauze as directed. Transpore Surgical Tape, 2x10 (in/yd) Discharge Instruction: Secure  dressing with tape as directed. AMOAH, Zonia ([id number]) [id number] Page 9 of 13 Compression Wrap Compression Stockings Add-Ons Electronic Signature(s) Signed: 07/11/2022 4:37:09 PM By: Adline Peals Entered By: Adline Peals on 07/11/2022 11:10:06 -------------------------------------------------------------------------------- Wound Assessment Details Patient Name: Date of Service: Meredith Staggers RO N 07/11/2022 10:45 A M Medical Record Number: [id number] Patient Account Number: [id number] Date of Birth/Sex: Treating RN: Aug 05, 1940 (82 y.o. F) Primary Care Joeleen Wortley: Jenne Pane, [facility] Other Clinician: Referring Erskine Steinfeldt: Treating Thora Scherman/Extender: Fredirick Maudlin SHO Carlynn Spry, [facility] Weeks in Treatment: 4 Wound Status Wound Number: 2 Primary Etiology: Abrasion Wound Location: Left, Lateral T Great oe Wound Status: Open Wounding Event: Shear/Friction Comorbid History: Anemia, Angina, Hypertension, Vasculitis, Osteoarthritis Date Acquired: 05/24/2021 Weeks Of Treatment:  4 Clustered Wound: No Photos Wound Measurements Length: (cm) 0.7 Width: (cm) 1 Depth: (cm) 0.1 Area: (cm) 0.55 Volume: (cm) 0.055 % Reduction in Area: 75.4% % Reduction in Volume: 75.4% Epithelialization: None Tunneling: No Undermining: No Wound Description Classification: 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 Support Structures Wound Margin: Distinct, outline attached Exudate Amount: Medium Exudate Type: Serous Exudate Color: amber Foul Odor After Cleansing: No Slough/Fibrino Yes Wound Bed Granulation Amount: Small (1-33%) Exposed Structure Granulation Quality: Red Fascia Exposed: No Necrotic Amount: Large (67-100%) Fat Layer (Subcutaneous Tissue) Exposed: Yes Necrotic Quality: Adherent Slough Tendon Exposed: No Muscle Exposed: No Joint Exposed: No Bone Exposed: No Periwound Skin Texture Texture Color No Abnormalities Noted: Yes No Abnormalities Noted: No Rubor: Yes Moisture No Abnormalities Noted: No Temperature / Pain Ransdell, Bobie ([id number]) [id number] Page 10 of 13 Maceration: Yes Temperature: No Abnormality Treatment Notes Wound #2 (Toe Great) Wound Laterality: Left, Lateral Cleanser Soap and Water Discharge Instruction: May shower and wash wound with dial antibacterial soap and water prior to dressing change. Wound Cleanser Discharge Instruction: Cleanse the wound with wound cleanser prior to applying a clean dressing using gauze sponges, not tissue or cotton balls. Peri-Wound Care Topical Primary Dressing Sorbalgon AG Dressing 2x2 (in/in) Discharge Instruction: Apply to wound bed as instructed Secondary Dressing Woven Gauze Sponge, Non-Sterile 4x4 in Discharge Instruction: Apply over primary dressing as directed. Secured With Conforming Stretch Gauze Bandage Roll, Sterile 4x75 (in/in) Discharge Instruction: Secure with stretch gauze as directed. Transpore Surgical Tape, 2x10 (in/yd) Discharge Instruction: Secure dressing  with tape as directed. Compression Wrap Compression Stockings Add-Ons Electronic Signature(s) Signed: 07/11/2022 4:37:09 PM By: Adline Peals Entered By: Adline Peals on 07/11/2022 11:10:57 -------------------------------------------------------------------------------- Wound Assessment Details Patient Name: Date of Service: Meredith Staggers RO N 07/11/2022 10:45 A M Medical Record Number: [id number] Patient Account Number: [id number] Date of Birth/Sex: Treating RN: [birth year] (82 y.o. F) Primary Care Elvyn Krohn: Jenne Pane, [facility] Other Clinician: Referring Kim Oki: Treating Brooklee Michelin/Extender: Fredirick Maudlin SHO KES, [facility] Weeks in Treatment: 4 Wound Status Wound Number: 3 Primary Etiology: Abrasion Wound Location: Left, Medial T Third oe Wound Status: Open Wounding Event: Shear/Friction Comorbid History: Anemia, Angina, Hypertension, Vasculitis, Osteoarthritis Date Acquired: 05/24/2021 Weeks Of Treatment: 4 Clustered Wound: No Photos Sandford, Graciemae ([id number]) [id number] Page 11 of 13 Wound Measurements Length: (cm) 0.5 Width: (cm) 1 Depth: (cm) 0.1 Area: (cm) 0.393 Volume: (cm) 0.039 % Reduction in Area: 28.5% % Reduction in Volume: 29.1% Epithelialization: None Tunneling: No Undermining: No Wound Description Classification: 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 Support Structures Wound Margin: Distinct, outline attached Exudate Amount: Medium Exudate Type: Serous Exudate Color: amber Foul Odor After Cleansing: No Slough/Fibrino Yes Wound Bed Granulation Amount: Large (67-100%) Exposed Structure Granulation Quality: Red Fascia Exposed: No Necrotic Amount: Small (1-33%) Fat Layer (Subcutaneous Tissue) Exposed:  Yes Necrotic Quality: Adherent Slough Tendon Exposed: No Muscle Exposed: No Joint Exposed: No Bone Exposed: No Periwound Skin Texture Texture Color No Abnormalities Noted: Yes No Abnormalities Noted: No Rubor:  Yes Moisture No Abnormalities Noted: No Temperature / Pain Maceration: Yes Temperature: No Abnormality Treatment Notes Wound #3 (Toe Third) Wound Laterality: Left, Medial Cleanser Soap and Water Discharge Instruction: May shower and wash wound with dial antibacterial soap and water prior to dressing change. Wound Cleanser Discharge Instruction: Cleanse the wound with wound cleanser prior to applying a clean dressing using gauze sponges, not tissue or cotton balls. Peri-Wound Care Topical Primary Dressing Sorbalgon AG Dressing 2x2 (in/in) Discharge Instruction: Apply to wound bed as instructed Secondary Dressing Woven Gauze Sponge, Non-Sterile 4x4 in Discharge Instruction: Apply over primary dressing as directed. Secured With Conforming Stretch Gauze Bandage Roll, Sterile 4x75 (in/in) Discharge Instruction: Secure with stretch gauze as directed. Transpore Surgical Tape, 2x10 (in/yd) Discharge Instruction: Secure dressing with tape as directed. Compression Wrap Compression Stockings Add-Ons Electronic Signature(s) Signed: 07/11/2022 4:37:09 PM By: Adline Peals Entered By: Adline Peals on 07/11/2022 11:11:18 Wound Assessment Details -------------------------------------------------------------------------------- [name] ([id number]) [id number] Page 12 of 13 Patient Name: Date of Service: Jannette Spanner 07/11/2022 10:45 A M Medical Record Number: [id number] Patient Account Number: [id number] Date of Birth/Sex: Treating RN: 03/21/1941 (82 y.o. F) Primary Care Kaya Klausing: Jenne Pane, [facility] Other Clinician: Referring Selwyn Reason: Treating Ostin Mathey/Extender: Fredirick Maudlin SHO Carlynn Spry, [facility] Weeks in Treatment: 4 Wound Status Wound Number: 4 Primary Etiology: Skin Tear Wound Location: Left, Plantar Foot Wound Status: Open Wounding Event: Skin Tear/Laceration Comorbid History: Anemia, Angina, Hypertension, Vasculitis, Osteoarthritis Date  Acquired: 06/20/2022 Weeks Of Treatment: 3 Clustered Wound: No Photos Wound Measurements Length: (cm) 0 % Reduction in Area: 100% Width: (cm) 0 % Reduction in Volume: 100% Depth: (cm) 0 Epithelialization: Large (67-100%) Area: (cm) 0 Tunneling: No Volume: (cm) 0 Undermining: No Wound Description Classification: 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 Support Structures Foul Odor After Cleansing: No Wound Margin: Distinct, outline attached Slough/Fibrino No Exudate Amount: None Present Wound Bed Granulation Amount: None Present (0%) Exposed Structure Necrotic Amount: None Present (0%) Fascia Exposed: No Fat Layer (Subcutaneous Tissue) Exposed: No Tendon Exposed: No Muscle Exposed: No Joint Exposed: No Bone Exposed: No Periwound Skin Texture Texture Color No Abnormalities Noted: Yes No Abnormalities Noted: No Rubor: Yes Moisture No Abnormalities Noted: Yes Temperature / Pain Temperature: No Abnormality Treatment Notes Wound #4 (Foot) Wound Laterality: Plantar, Left Cleanser Soap and Water Discharge Instruction: May shower and wash wound with dial antibacterial soap and water prior to dressing change. Wound Cleanser Discharge Instruction: Cleanse the wound with wound cleanser prior to applying a clean dressing using gauze sponges, not tissue or cotton balls. Peri-Wound Care Topical Primary Dressing Sorbalgon AG Dressing 2x2 (in/in) [place], [place] ([id number]) [id number] Page 13 of 13 Discharge Instruction: Apply to wound bed as instructed Secondary Dressing Woven Gauze Sponge, Non-Sterile 4x4 in Discharge Instruction: Apply over primary dressing as directed. Secured With Conforming Stretch Gauze Bandage Roll, Sterile 4x75 (in/in) Discharge Instruction: Secure with stretch gauze as directed. Transpore Surgical Tape, 2x10 (in/yd) Discharge Instruction: Secure dressing with tape as directed. Compression Wrap Compression  Stockings Add-Ons Electronic Signature(s) Signed: 07/11/2022 4:37:09 PM By: Adline Peals Entered By: Adline Peals on 07/11/2022 11:11:38 -------------------------------------------------------------------------------- Vitals Details Patient Name: Date of Service: [name], [facility] RO N 07/11/2022 10:45 A M Medical Record Number: [id number] Patient Account Number: [id number] Date of Birth/Sex: Treating RN: November 06, 1940 (82 y.o. F) Primary Care Park Beck: Jenne Pane, [facility]  Other Clinician: Referring Mieke Brinley: Treating Sieanna Vanstone/Extender: Fredirick Maudlin SHO Carlynn Spry, [facility] Weeks in Treatment: 4 Vital Signs Time Taken: 10:59 Temperature (F): 97.9 Height (in): 67 Pulse (bpm): 96 Weight (lbs): 153 Respiratory Rate (breaths/min): 20 Body Mass Index (BMI): 24 Blood Pressure (mmHg): 133/76 Reference Range: 80 - 120 mg / dl Electronic Signature(s) Signed: 07/11/2022 12:45:59 PM By: Worthy Rancher Entered By: Worthy Rancher on 07/11/2022 11:00:22

## 2022-07-19 ENCOUNTER — Encounter (HOSPITAL_BASED_OUTPATIENT_CLINIC_OR_DEPARTMENT_OTHER): Payer: Medicare Other | Admitting: General Surgery

## 2022-07-19 DIAGNOSIS — L97522 Non-pressure chronic ulcer of other part of left foot with fat layer exposed: Secondary | ICD-10-CM | POA: Diagnosis not present

## 2022-07-20 NOTE — Progress Notes (Signed)
MUSKAAN, WAMMACK ([id number]) [id number] Page 1 of 13 Visit Report for 07/19/2022 Arrival Information Details Patient Name: Date of Service: [name] 07/19/2022 12:45 PM Medical Record Number: [id number] Patient Account Number: [id number] Date of Birth/Sex: Treating RN: [birth year] (82 y.o. Female) [name] Primary Joseph [name]: [name], 2020 [facility] Joseph Other Clinician: Referring [name]: Treating [name]/Extender: [name], [name] Weeks in Treatment: 5 Visit Information History Since Last Visit All ordered tests and consults were completed: No Patient Arrived: Wheel Chair Added or deleted any medications: No Arrival Time: 12:57 Any new allergies or adverse reactions: No Accompanied By: aide Had a fall or experienced change in No Transfer Assistance: None activities of daily living that may affect Patient Identification Verified: Yes risk of falls: Secondary Verification Process Completed: Yes Signs or symptoms of abuse/neglect since last visito No Hospitalized since last visit: No Implantable device outside of the clinic excluding No cellular tissue based products placed in the Joseph since last visit: Pain Present Now: No Electronic Signature(s) Signed: 07/19/2022 4:51:27 PM By: Kathryn Catholic RN Entered By: [name] on 07/19/2022 12:58:02 -------------------------------------------------------------------------------- Compression Therapy Details Patient Name: Date of Service: [name] [name] RO N 07/19/2022 12:45 PM Medical Record Number: [id number] Patient Account Number: [id number] Date of Birth/Sex: Treating RN: June 01, 1940 (82 y.o. Female) [name] Primary Joseph [name]: [name], [facility] Other Clinician: Referring [name]: Treating [name]/Extender: [name] SHO [name], [name] Weeks in Treatment: 5 Compression Therapy Performed for Wound Assessment: Wound #4 Left,Plantar Foot Performed By: Clinician  Kathryn Catholic, RN Compression Type: Rolena Infante Post Procedure Diagnosis Same as Pre-procedure Notes Zinc Rolena Infante Electronic Signature(s) Signed: 07/19/2022 4:51:27 PM By: Kathryn Catholic RN Entered By: [name] on 07/19/2022 13:51:33 -------------------------------------------------------------------------------- Encounter Discharge Information Details Patient Name: Date of Service: [name] [name] RO N 07/19/2022 12:45 PM Medical Record Number: [id number] Patient Account Number: [id number] Date of Birth/Sex: Treating RN: 01/25/1941 (82 y.o. Female) [name] Primary Joseph [name]: [name], [place] [facility] Other Clinician: Referring [name]: Treating [name]/Extender: [name] SHO [name], [name] Weeks in Treatment: 5 Encounter Discharge Information Items Post Procedure Vitals Discharge Condition: Stable Temperature (F): 97.6 Ambulatory Status: Wheelchair Pulse (bpm): [place], [place] ([id number]) [id number] Page 2 of 13 Discharge Destination: Other (Note Required) Respiratory Rate (breaths/min): 20 Telephoned: No Blood Pressure (mmHg): 127/76 Orders Sent: Yes Transportation: Private Auto Accompanied By: friend Schedule Follow-up Appointment: Yes Clinical Summary of Joseph: Patient Declined Notes From [name] [place] Signature(s) Signed: 07/19/2022 4:51:27 PM By: Kathryn Catholic RN Entered By: [name] on 07/19/2022 16:49:59 -------------------------------------------------------------------------------- Lower Extremity Assessment Details Patient Name: Date of Service: [name] RO N 07/19/2022 12:45 PM Medical Record Number: [id number] Patient Account Number: [id number] Date of Birth/Sex: Treating RN: [birth year] (82 y.o. Female) [name] Primary Joseph [name]: [name], [facility] Other Clinician: Referring [name]: Treating [name]/Extender: [name] SHO [name], [place] [facility] Weeks in  Treatment: 5 Edema Assessment Assessed: [Left: No] [Right: No] [Left: Edema] [Right: :] Calf Left: Right: Point of Measurement: From Medial Instep 37 cm Ankle Left: Right: Point of Measurement: From Medial Instep 24.5 cm Vascular Assessment Pulses: Dorsalis Pedis Palpable: [Left:Yes] Electronic Signature(s) Signed: 07/19/2022 4:51:27 PM By: Kathryn Catholic RN Entered By: [name] on 07/19/2022 13:20:57 -------------------------------------------------------------------------------- Multi Wound Chart Details Patient Name: Date of Service: [name], [facility] [place] RO N 07/19/2022 12:45 PM Medical Record Number: [id number] Patient Account Number: [id number] Date of Birth/Sex: Treating RN: 06/13/1940 (82 y.o. Female) Primary Joseph [name]: [name], [facility] Other Clinician: Referring [name]:  Treating [name]/Extender: [name] SHO [name], [name] Weeks in Treatment: 5 Vital Signs Height(in): 67 Pulse(bpm): 98 Weight(lbs): 153 Blood Pressure(mmHg): 127/76 Body Mass Index(BMI): 24 Temperature(F): 97.6 Respiratory Rate(breaths/min): 20 [name], [name] [1:Photos:] [id number] Page 3 of 13] Left, Dorsal T Great oe Left, Medial T Great oe Left, Medial T Third oe Wound Location: Footwear Injury Shear/Friction Shear/Friction Wounding Event: Abrasion Abrasion Abrasion Primary Etiology: Anemia, Angina, Hypertension, Anemia, Angina, Hypertension, Anemia, Angina, Hypertension, Comorbid History: Vasculitis, Osteoarthritis Vasculitis, Osteoarthritis Vasculitis, Osteoarthritis 05/24/2021 05/24/2021 05/24/2021 Date Acquired: 5 0 5 Weeks of Treatment: Open Open Open Wound Status: No No No Wound Recurrence: 0.6x0.6x0.1 0.7x0.8x0.1 0.4x0.7x0.1 Measurements L x W x D (cm) 0.283 0.44 0.22 A (cm) : rea 0.028 0.044 0.022 Volume (cm) : 35.70% N/A 60.00% % Reduction in A rea: 36.40% N/A 60.00% % Reduction in Volume: Full Thickness Without  Exposed 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 Classification: Support Structures Support Structures Support Structures Medium Medium Medium Exudate A mount: Serous Serosanguineous Serous Exudate Type: amber red, brown amber Exudate Color: Distinct, outline attached N/A Distinct, outline attached Wound Margin: Medium (34-66%) Medium (34-66%) Medium (34-66%) Granulation A mount: Pink, Pale Pink, Pale Pink, Pale Granulation Quality: Medium (34-66%) Medium (34-66%) Medium (34-66%) Necrotic A mount: Fat Layer (Subcutaneous Tissue): Yes Fat Layer (Subcutaneous Tissue): Yes Fat Layer (Subcutaneous Tissue): Yes Exposed Structures: Fascia: No Fascia: No Fascia: No Tendon: No Tendon: No Tendon: No Muscle: No Muscle: No Muscle: No Joint: No Joint: No Joint: No Bone: No Bone: No Bone: No None Small (1-33%) None Epithelialization: Debridement - Selective/Open Wound N/A Debridement - Selective/Open Wound Debridement: Pre-procedure Verification/Time Out 13:40 N/A 13:40 Taken: Lidocaine 4% Topical Solution N/A Lidocaine 4% Topical Solution Pain Control: [place] Tissue Debrided: Non-Viable Tissue N/A Non-Viable Tissue Level: 0.36 N/A 0.28 Debridement A (sq cm): rea Curette N/A Curette Instrument: Large N/A Large Bleeding: Pressure N/A Pressure Hemostasis A chieved: 0 N/A 0 Procedural Pain: 0 N/A 0 Post Procedural Pain: Procedure was tolerated well N/A Procedure was tolerated well Debridement Treatment Response: 0.6x0.6x0.1 N/A 0.4x0.7x0.1 Post Debridement Measurements L x W x D (cm) 0.028 N/A 0.022 Post Debridement Volume: (cm) Scarring: Yes Scarring: Yes No Abnormalities Noted Periwound Skin Texture: Maceration: Yes Maceration: Yes Maceration: Yes Periwound Skin Moisture: Dry/Scaly: No Dry/Scaly: No Dry/Scaly: No Rubor: No No Abnormalities Noted Rubor: Yes Periwound Skin Color: No Abnormality No Abnormality No  Abnormality Temperature: Debridement N/A Debridement Procedures Performed: Wound Number: 4 5 N/A Photos: N/A Left, Plantar Foot Left T Second oe N/A Wound Location: Skin Tear/Laceration Gradually Appeared N/A Wounding Event: Skin Tear Abrasion N/A Primary Etiology: Anemia, Angina, Hypertension, Anemia, Angina, Hypertension, N/A Comorbid History: Vasculitis, Osteoarthritis Vasculitis, Osteoarthritis 06/20/2022 07/13/2022 N/A Date Acquired: 4 0 N/A Weeks of Treatment: Open Open N/A Wound Status: No No N/A Wound Recurrence: 0.1x0.1x0.1 0.2x0.2x0.1 N/A Measurements L x W x D (cm) 0.008 0.031 N/A A (cm) : [name], [name] ([id number] Page 4 of 13 0.001 0.003 N/A Volume (cm) : 74.20% N/A N/A % Reduction in Area: 66.70% N/A N/A % Reduction in Volume: 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 N/A Classification: Support Structures Support Structures None Present Medium N/A Exudate A mount: N/A Serosanguineous N/A Exudate Type: N/A red, brown N/A Exudate Color: Distinct, outline attached N/A N/A Wound Margin: Medium (34-66%) Large (67-100%) N/A Granulation A mount: Pink, Pale Red N/A Granulation Quality: Medium (34-66%) Small (1-33%) N/A Necrotic A mount: Fat Layer (Subcutaneous Tissue): Yes Fat Layer (Subcutaneous Tissue): Yes N/A  Exposed Structures: Fascia: No Tendon: No Muscle: No Joint: No Bone: No Large (67-100%) None N/A Epithelialization: N/A Debridement - Selective/Open Wound N/A Debridement: Pre-procedure Verification/Time Out N/A 13:40 N/A Taken: N/A Lidocaine 4% Topical Solution N/A Pain Control: N/A Slough N/A Tissue Debrided: N/A Non-Viable Tissue N/A Level: N/A 0.04 N/A Debridement A (sq cm): rea N/A Curette N/A Instrument: N/A Large N/A Bleeding: N/A Pressure N/A Hemostasis A chieved: N/A 0 N/A Procedural Pain: N/A 0 N/A Post Procedural Pain: N/A Procedure was  tolerated well N/A Debridement Treatment Response: N/A 0.2x0.2x0.1 N/A Post Debridement Measurements L x W x D (cm) N/A 0.003 N/A Post Debridement Volume: (cm) Scarring: Yes Scarring: Yes N/A Periwound Skin Texture: Maceration: Yes Maceration: Yes N/A Periwound Skin Moisture: Dry/Scaly: No Rubor: Yes No Abnormalities Noted N/A Periwound Skin Color: No Abnormality No Abnormality N/A Temperature: Compression Therapy Debridement N/A Procedures Performed: Treatment Notes Electronic Signature(s) Signed: 07/19/2022 2:07:34 PM By: Kathryn Maudlin MD FACS Entered By: [name] on 07/19/2022 14:07:33 -------------------------------------------------------------------------------- Multi-Disciplinary Joseph Plan Details Patient Name: Date of Service: [name], [facility] RO N 07/19/2022 12:45 PM Medical Record Number: [id number] Patient Account Number: [id number] Date of Birth/Sex: Treating RN: March 19, 1941 (82 y.o. Female) [name] Primary Joseph Cailey Trigueros: [name], [facility] Joseph Other Clinician: Referring Reniah Cottingham: Treating Florena Kozma/Extender: [name], [facility] Joseph Weeks in Treatment: 5 Active Inactive Necrotic Tissue Nursing Diagnoses: Impaired tissue integrity related to necrotic/devitalized tissue Knowledge deficit related to management of necrotic/devitalized tissue Goals: Necrotic/devitalized tissue will be minimized in the wound bed Date Initiated: 06/12/2022 Target Resolution Date: 10/26/2022 Goal Status: Active Patient/caregiver will verbalize understanding of reason and process for debridement of necrotic tissue Date Initiated: 06/12/2022 Target Resolution Date: 10/26/2022 Goal Status: Active Interventions: [name], [name] ([id number]) [phone].pdf Page 5 of 13 Assess patient pain level pre-, during and post procedure and prior to discharge Provide education on necrotic tissue and debridement process Treatment Activities: Apply topical  anesthetic as ordered : 06/12/2022 Notes: Wound/Skin Impairment Nursing Diagnoses: Impaired tissue integrity Knowledge deficit related to ulceration/compromised skin integrity Goals: Patient/caregiver will verbalize understanding of skin Joseph regimen Date Initiated: 06/12/2022 Target Resolution Date: 10/26/2022 Goal Status: Active Interventions: Assess ulceration(s) every visit Treatment Activities: Skin Joseph regimen initiated : 06/12/2022 Topical wound management initiated : 06/12/2022 Notes: Electronic Signature(s) Signed: 07/19/2022 4:51:27 PM By: Kathryn Catholic RN Entered By: [name] on 07/19/2022 15:10:12 -------------------------------------------------------------------------------- Pain Assessment Details Patient Name: Date of Service: [name] RO N 07/19/2022 12:45 PM Medical Record Number: [id number] Patient Account Number: [id number] Date of Birth/Sex: Treating RN: 1940/11/05 (82 y.o. Female) [name] Primary Joseph Arletta Lumadue: [name], [facility] Other Clinician: Referring Alyissa Whidbee: Treating Andres Bantz/Extender: [name] SHO [name], [facility] Weeks in Treatment: 5 Active Problems Location of Pain Severity and Description of Pain Patient Has Paino No Site Locations Pain Management and Medication Current Pain Management: Electronic Signature(s) Signed: 07/19/2022 4:51:27 PM By: Kathryn Catholic RN Entered By: [name] on 07/19/2022 12:58:35 [name], [name] ([id number] Page 6 of 13 -------------------------------------------------------------------------------- Patient/Caregiver Education Details Patient Name: Date of Service: [name] 3/28/2024andnbsp12:45 PM Medical Record Number: [id number] Patient Account Number: [id number] Date of Birth/Gender: Treating RN: [birth year] (82 y.o. Female) [name] Primary Joseph Physician: [name], [facility] Other Clinician: Referring Physician: Treating  Physician/Extender: [name], [facility] Weeks in Treatment: 5 Education Assessment Education Provided To: Patient Education Topics Provided Wound/Skin Impairment: Methods: Explain/Verbal Responses: Return demonstration correctly Electronic Signature(s) Signed: 07/19/2022 4:51:27 PM By: Kathryn Catholic RN Entered By: [name] on 07/19/2022 16:48:29 --------------------------------------------------------------------------------  Wound Assessment Details Patient Name: Date of Service: [name] 07/19/2022 12:45 PM Medical Record Number: [id number] Patient Account Number: [id number] Date of Birth/Sex: Treating RN: 12/06/1940 (82 y.o. Female) [name] Primary Joseph Cabe Lashley: [name], [facility] Other Clinician: Referring Ewel Lona: Treating Jenny Lai/Extender: [name] SHO [name], [facility] Weeks in Treatment: 5 Wound Status Wound Number: 1 Primary Etiology: Abrasion Wound Location: Left, Dorsal T Great oe Wound Status: Open Wounding Event: Footwear Injury Comorbid History: Anemia, Angina, Hypertension, Vasculitis, Osteoarthritis Date Acquired: 05/24/2021 Weeks Of Treatment: 5 Clustered Wound: No Photos Wound Measurements Length: (cm) 0.6 Width: (cm) 0.6 Depth: (cm) 0.1 Area: (cm) 0.283 Volume: (cm) 0.028 % Reduction in Area: 35.7% % Reduction in Volume: 36.4% Epithelialization: None Tunneling: No Undermining: No Wound Description Classification: 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 Support Structures Wound Margin: Distinct, outline attached [name], [name] ([id number]) Exudate Amount: Medium Exudate Type: Serous Exudate Color: amber Foul Odor After Cleansing: No Slough/Fibrino Yes [id number] Page 7 of 13 Wound Bed Granulation Amount: Medium (34-66%) Exposed Structure Granulation Quality: Pink, Pale Fascia Exposed: No Necrotic Amount: Medium (34-66%) Fat Layer (Subcutaneous Tissue) Exposed: Yes Necrotic Quality:  Adherent Slough Tendon Exposed: No Muscle Exposed: No Joint Exposed: No Bone Exposed: No Periwound Skin Texture Texture Color No Abnormalities Noted: No No Abnormalities Noted: No Scarring: Yes Rubor: No Moisture Temperature / Pain No Abnormalities Noted: No Temperature: No Abnormality Dry / Scaly: No Maceration: Yes Treatment Notes Wound #1 (Toe Great) Wound Laterality: Dorsal, Left Cleanser Soap and Water Discharge Instruction: May shower and wash wound with dial antibacterial soap and water prior to dressing change. Wound Cleanser Discharge Instruction: Cleanse the wound with wound cleanser prior to applying a clean dressing using gauze sponges, not tissue or cotton balls. Peri-Wound Joseph Topical Primary Dressing Sorbalgon AG Dressing 2x2 (in/in) Discharge Instruction: Weave the silver alginate between the left toes Secondary Dressing Woven Gauze Sponge, Non-Sterile 4x4 in Discharge Instruction: can use gauze between toes (if no desired) Secured With Compression Wrap Compression Stockings Add-Ons Electronic Signature(s) Signed: 07/19/2022 4:51:27 PM By: Kathryn Catholic RN Entered By: [name] on 07/19/2022 13:22:27 -------------------------------------------------------------------------------- Wound Assessment Details Patient Name: Date of Service: [name] [facility] RO N 07/19/2022 12:45 PM Medical Record Number: [id number] Patient Account Number: [id number] Date of Birth/Sex: Treating RN: [birth year] (82 y.o. Female) [name] Primary Joseph Blayze Haen: [name], [place] Joseph Other Clinician: Referring Teja Judice: Treating Jolea Dolle/Extender: [name] SHO [name], [facility] Weeks in Treatment: 5 Wound Status Wound Number: 2 Primary Etiology: Abrasion Wound Location: Left, Medial T Great oe Wound Status: Open Wounding Event: Shear/Friction Comorbid History: Anemia, Angina, Hypertension, Vasculitis, Osteoarthritis Date Acquired: 05/24/2021 Weeks Of Treatment:  0 Clustered Wound: No [name], [name] ([id number] Page 8 of 13 Photos Wound Measurements Length: (cm) 0.7 Width: (cm) 0.8 Depth: (cm) 0.1 Area: (cm) 0.44 Volume: (cm) 0.044 % Reduction in Area: % Reduction in Volume: Epithelialization: Small (1-33%) Tunneling: No Undermining: No Wound Description Classification: 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 Support Structures Exudate Amount: Medium Exudate Type: Serosanguineous Exudate Color: red, brown Foul Odor After Cleansing: No Slough/Fibrino Yes Wound Bed Granulation Amount: Medium (34-66%) Exposed Structure Granulation Quality: Pink, Pale Fascia Exposed: No Necrotic Amount: Medium (34-66%) Fat Layer (Subcutaneous Tissue) Exposed: Yes Necrotic Quality: Adherent Slough Tendon Exposed: No Muscle Exposed: No Joint Exposed: No Bone Exposed: No Periwound Skin Texture Texture Color No Abnormalities Noted: No No Abnormalities Noted: Yes Scarring: Yes Temperature / Pain Temperature: No Abnormality Moisture No Abnormalities Noted: No Dry / Scaly: No Maceration: Yes Treatment Notes Wound #2 (  Toe Great) Wound Laterality: Left, Medial Cleanser Soap and Water Discharge Instruction: May shower and wash wound with dial antibacterial soap and water prior to dressing change. Wound Cleanser Discharge Instruction: Cleanse the wound with wound cleanser prior to applying a clean dressing using gauze sponges, not tissue or cotton balls. Peri-Wound Joseph Topical Primary Dressing Sorbalgon AG Dressing 2x2 (in/in) Discharge Instruction: Weave the silver alginate between the left toes Secondary Dressing Woven Gauze Sponge, Non-Sterile 4x4 in Discharge Instruction: can use gauze between toes (if no desired) Secured With Compression Wrap Compression Stockings [place], [place] ([id number] Page 9 of 13 Add-Ons Electronic Signature(s) Signed: 07/19/2022 4:51:27 PM  By: Kathryn Catholic RN Entered By: [name] on 07/19/2022 13:28:21 -------------------------------------------------------------------------------- Wound Assessment Details Patient Name: Date of Service: [name] [facility] RO N 07/19/2022 12:45 PM Medical Record Number: [id number] Patient Account Number: [id number] Date of Birth/Sex: Treating RN: 12-17-1940 (82 y.o. Female) [name] Primary Joseph Coltin Casher: [name], [facility] Other Clinician: Referring Patrece Tallie: Treating Mal Asher/Extender: [name] SHO [name], [facility] Weeks in Treatment: 5 Wound Status Wound Number: 3 Primary Etiology: Abrasion Wound Location: Left, Medial T Third oe Wound Status: Open Wounding Event: Shear/Friction Comorbid History: Anemia, Angina, Hypertension, Vasculitis, Osteoarthritis Date Acquired: 05/24/2021 Weeks Of Treatment: 5 Clustered Wound: No Photos Wound Measurements Length: (cm) 0.4 Width: (cm) 0.7 Depth: (cm) 0.1 Area: (cm) 0.22 Volume: (cm) 0.022 % Reduction in Area: 60% % Reduction in Volume: 60% Epithelialization: None Tunneling: No Undermining: No Wound Description Classification: 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 Support Structures Wound Margin: Distinct, outline attached Exudate Amount: Medium Exudate Type: Serous Exudate Color: amber Foul Odor After Cleansing: No Slough/Fibrino Yes Wound Bed Granulation Amount: Medium (34-66%) Exposed Structure Granulation Quality: Pink, Pale Fascia Exposed: No Necrotic Amount: Medium (34-66%) Fat Layer (Subcutaneous Tissue) Exposed: Yes Necrotic Quality: Adherent Slough Tendon Exposed: No Muscle Exposed: No Joint Exposed: No Bone Exposed: No Periwound Skin Texture Texture Color No Abnormalities Noted: Yes No Abnormalities Noted: Yes Moisture Temperature / Pain No Abnormalities Noted: No Temperature: No Abnormality Dry / Scaly: No Maceration: Yes Treatment Notes [name], [name] ([id number]  Page 10 of 13 Wound #3 (Toe Third) Wound Laterality: Left, Medial Cleanser Soap and Water Discharge Instruction: May shower and wash wound with dial antibacterial soap and water prior to dressing change. Wound Cleanser Discharge Instruction: Cleanse the wound with wound cleanser prior to applying a clean dressing using gauze sponges, not tissue or cotton balls. Peri-Wound Joseph Topical Primary Dressing Sorbalgon AG Dressing 2x2 (in/in) Discharge Instruction: Weave the silver alginate between the left toes Secondary Dressing Woven Gauze Sponge, Non-Sterile 4x4 in Discharge Instruction: can use gauze between toes (if no desired) Secured With Compression Wrap Compression Stockings Add-Ons Electronic Signature(s) Signed: 07/19/2022 4:51:27 PM By: Kathryn Catholic RN Entered By: [name] on 07/19/2022 13:23:58 -------------------------------------------------------------------------------- Wound Assessment Details Patient Name: Date of Service: [name] [facility] RO N 07/19/2022 12:45 PM Medical Record Number: [id number] Patient Account Number: [id number] Date of Birth/Sex: Treating RN: July 29, 1940 (82 y.o. Female) [name] Primary Joseph Anzel Kearse: [name], [facility] Other Clinician: Referring Archie Atilano: Treating Yazmeen Woolf/Extender: [name] SHO [name], [facility] Weeks in Treatment: 5 Wound Status Wound Number: 4 Primary Etiology: Skin Tear Wound Location: Left, Plantar Foot Wound Status: Open Wounding Event: Skin Tear/Laceration Comorbid History: Anemia, Angina, Hypertension, Vasculitis, Osteoarthritis Date Acquired: 06/20/2022 Weeks Of Treatment: 4 Clustered Wound: No Photos Wound Measurements Length: (cm) 0.1 Width: (cm) 0.1 Depth: (cm) 0.1 Area: (cm) 0.008 Volume: (cm) 0.001 % Reduction in Area: 74.2% % Reduction in Volume:  66.7% Epithelialization: Large (67-100%) Tunneling: No Undermining: No Wound Description Classification: 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  Support Structures [name], [name] ([id number]) Wound Margin: Distinct, outline attached Exudate Amount: None Present Foul Odor After Cleansing: No [id number] Page 11 of 13 Slough/Fibrino No Wound Bed Granulation Amount: Medium (34-66%) Exposed Structure Granulation Quality: Pink, Pale Fascia Exposed: No Necrotic Amount: Medium (34-66%) Fat Layer (Subcutaneous Tissue) Exposed: Yes Necrotic Quality: Adherent Slough Tendon Exposed: No Muscle Exposed: No Joint Exposed: No Bone Exposed: No Periwound Skin Texture Texture Color No Abnormalities Noted: No No Abnormalities Noted: Yes Scarring: Yes Temperature / Pain Temperature: No Abnormality Moisture No Abnormalities Noted: No Dry / Scaly: No Maceration: Yes Treatment Notes Wound #4 (Foot) Wound Laterality: Plantar, Left Cleanser Soap and Water Discharge Instruction: May shower and wash wound with dial antibacterial soap and water prior to dressing change. Wound Cleanser Discharge Instruction: Cleanse the wound with wound cleanser prior to applying a clean dressing using gauze sponges, not tissue or cotton balls. Peri-Wound Joseph Zinc Oxide Ointment 30g tube Discharge Instruction: Apply Zinc Oxide to periwound with each dressing change Topical Primary Dressing Sorbalgon AG Dressing 2x2 (in/in) Discharge Instruction: Weave the silver alginate between the left toes Secondary Dressing Woven Gauze Sponge, Non-Sterile 4x4 in Discharge Instruction: can use gauze between toes (if no desired) Secured With Compression Wrap CoFlex Zinc Unna Boot, 4 x 6 (in/yd) Discharge Instruction: Left Lower leg Coflex Zinc Unna Boot or Use Zinc Unna boot Compression Stockings Add-Ons Electronic Signature(s) Signed: 07/19/2022 4:51:27 PM By: Kathryn Catholic RN Entered By: [name] on 07/19/2022 13:28:58 -------------------------------------------------------------------------------- Wound Assessment  Details Patient Name: Date of Service: [name] [facility] RO N 07/19/2022 12:45 PM Medical Record Number: [id number] Patient Account Number: [id number] Date of Birth/Sex: Treating RN: [birth year] (82 y.o. Female) [name] Primary Joseph Catherina Pates: [name], [facility] Other Clinician: Referring Andruw Battie: Treating Jimesha Rising/Extender: [name] SHO [name], [facility] Weeks in Treatment: 5 Wound Status Wound Number: 5 Primary Etiology: Abrasion Wound Location: Left T Second oe Wound Status: Open [name], [name] ([id number] Page 12 of 13 Wounding Event: Gradually Appeared Comorbid History: Anemia, Angina, Hypertension, Vasculitis, Osteoarthritis Date Acquired: 07/13/2022 Weeks Of Treatment: 0 Clustered Wound: No Photos Wound Measurements Length: (cm) 0.2 Width: (cm) 0.2 Depth: (cm) 0.1 Area: (cm) 0.031 Volume: (cm) 0.003 % Reduction in Area: % Reduction in Volume: Epithelialization: None Tunneling: No Undermining: No Wound Description Classification: 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 Support Structures Exudate Amount: Medium Exudate Type: Serosanguineous Exudate Color: red, brown Foul Odor After Cleansing: No Wound Bed Granulation Amount: Large (67-100%) Exposed Structure Granulation Quality: Red Fat Layer (Subcutaneous Tissue) Exposed: Yes Necrotic Amount: Small (1-33%) Necrotic Quality: Adherent Slough Periwound Skin Texture Texture Color No Abnormalities Noted: No No Abnormalities Noted: Yes Scarring: Yes Temperature / Pain Temperature: No Abnormality Moisture No Abnormalities Noted: No Maceration: Yes Treatment Notes Wound #5 (Toe Second) Wound Laterality: Left Cleanser Soap and Water Discharge Instruction: May shower and wash wound with dial antibacterial soap and water prior to dressing change. Wound Cleanser Discharge Instruction: Cleanse the wound with wound cleanser prior to applying a clean dressing using gauze sponges, not  tissue or cotton balls. Peri-Wound Joseph Topical Primary Dressing Sorbalgon AG Dressing 2x2 (in/in) Discharge Instruction: Weave the silver alginate between the left toes Secondary Dressing Woven Gauze Sponge, Non-Sterile 4x4 in Discharge Instruction: can use gauze between toes (if no desired) Secured With Compression Wrap Compression Stockings [place], [place] ([id number] Page 13 of 13 Add-Ons Electronic Signature(s) Signed: 07/19/2022 4:51:27 PM By: Kathryn Catholic RN  Entered By: [name] on 07/19/2022 14:00:54 -------------------------------------------------------------------------------- Vitals Details Patient Name: Date of Service: [name] 07/19/2022 12:45 PM Medical Record Number: [id number] Patient Account Number: [id number] Date of Birth/Sex: Treating RN: [birth year] (82 y.o. Female) [name] Primary Joseph Iker Nuttall: [name], [facility] Other Clinician: Referring Marshell Dilauro: Treating Gerlean Cid/Extender: [name] SHO [name], [facility] Weeks in Treatment: 5 Vital Signs Time Taken: 12:58 Temperature (F): 97.6 Height (in): 67 Pulse (bpm): 98 Weight (lbs): 153 Respiratory Rate (breaths/min): 20 Body Mass Index (BMI): 24 Blood Pressure (mmHg): 127/76 Reference Range: 80 - 120 mg / dl Electronic Signature(s) Signed: 07/19/2022 4:51:27 PM By: Kathryn Catholic RN Entered By: [name] on 07/19/2022 12:58:26

## 2022-07-20 NOTE — Progress Notes (Signed)
LABELLE, TREMONTI ([id number]) [id number] Page 1 of 11 Visit Report for 07/19/2022 Chief Complaint Document Details Patient Name: Date of Service: [name] 07/19/2022 12:45 PM Medical Record Number: [id number] Patient Account Number: [id number] Date of Birth/Sex: Treating RN: Apr 27, 1940 (82 y.o. Female) Primary Care Provider: Jenne Pane, [facility] Other Clinician: Referring Provider: Treating Provider/Extender: Devoria Albe, [facility] Weeks in Treatment: 5 Information Obtained from: Patient Chief Complaint Patient seen for complaints of Non-Healing Wound. Electronic Signature(s) Signed: 07/19/2022 2:07:39 PM By: Fredirick Maudlin MD FACS Entered By: Fredirick Maudlin on 07/19/2022 14:07:39 -------------------------------------------------------------------------------- Debridement Details Patient Name: Date of Service: [name], [facility] RO N 07/19/2022 12:45 PM Medical Record Number: [id number] Patient Account Number: [id number] Date of Birth/Sex: Treating RN: Aug 17, 1940 (82 y.o. Female) [name] Primary Care Provider: Jenne Pane, [facility] Other Clinician: Referring Provider: Treating Provider/Extender: Devoria Albe, [facility] Weeks in Treatment: 5 Debridement Performed for Assessment: Wound #1 Left,Dorsal T Great oe Performed By: Physician Fredirick Maudlin, MD Debridement Type: Debridement Level of Consciousness (Pre-procedure): Awake and Alert Pre-procedure Verification/Time Out Yes - 13:40 Taken: Start Time: 13:40 Pain Control: Lidocaine 4% T opical Solution T Area Debrided (L x W): otal 0.6 (cm) x 0.6 (cm) = 0.36 (cm) Tissue and other material debrided: Non-Viable, Slough, Slough Level: Non-Viable Tissue Debridement Description: Selective/Open Wound Instrument: Curette Bleeding: Large Hemostasis Achieved: Pressure End Time: 13:42 Procedural Pain: 0 Post Procedural Pain: 0 Response to Treatment: Procedure was tolerated  well Level of Consciousness (Post- Awake and Alert procedure): Post Debridement Measurements of Total Wound Length: (cm) 0.6 Width: (cm) 0.6 Depth: (cm) 0.1 Volume: (cm) 0.028 Character of Wound/Ulcer Post Debridement: Improved Post Procedure Diagnosis Same as Pre-procedure Notes Scribed for Dr. Celine Ahr by J.Scotton Electronic Signature(s) Signed: 07/19/2022 2:16:52 PM By: Fredirick Maudlin MD FACS Signed: 07/19/2022 4:51:27 PM By: Kathryn Catholic RN [name], [name] ([id number]) [phone].pdf Page 2 of 11 Entered By: [name] on 07/19/2022 13:48:22 -------------------------------------------------------------------------------- Debridement Details Patient Name: Date of Service: [name] 07/19/2022 12:45 PM Medical Record Number: [id number] Patient Account Number: [id number] Date of Birth/Sex: Treating RN: 04/10/1941 (82 y.o. Female) [name] Primary Care Provider: Jenne Pane, [facility] Other Clinician: Referring Provider: Treating Provider/Extender: Devoria Albe, [facility] Weeks in Treatment: 5 Debridement Performed for Assessment: Wound #3 Left,Medial T Third oe Performed By: Physician Fredirick Maudlin, MD Debridement Type: Debridement Level of Consciousness (Pre-procedure): Awake and Alert Pre-procedure Verification/Time Out Yes - 13:40 Taken: Start Time: 13:40 Pain Control: Lidocaine 4% T opical Solution T Area Debrided (L x W): otal 0.4 (cm) x 0.7 (cm) = 0.28 (cm) Tissue and other material debrided: Non-Viable, Slough, Slough Level: Non-Viable Tissue Debridement Description: Selective/Open Wound Instrument: Curette Bleeding: Large Hemostasis Achieved: Pressure End Time: 13:42 Procedural Pain: 0 Post Procedural Pain: 0 Response to Treatment: Procedure was tolerated well Level of Consciousness (Post- Awake and Alert procedure): Post Debridement Measurements of Total Wound Length: (cm) 0.4 Width: (cm) 0.7 Depth:  (cm) 0.1 Volume: (cm) 0.022 Character of Wound/Ulcer Post Debridement: Improved Post Procedure Diagnosis Same as Pre-procedure Notes Scribed for Dr. Celine Ahr by J.Scotton Electronic Signature(s) Signed: 07/19/2022 2:16:52 PM By: Fredirick Maudlin MD FACS Signed: 07/19/2022 4:51:27 PM By: Kathryn Catholic RN Entered By: [name] on 07/19/2022 13:51:00 -------------------------------------------------------------------------------- Debridement Details Patient Name: Date of Service: [name], [facility] RO N 07/19/2022 12:45 PM Medical Record Number: [id number] Patient Account Number: [id number] Date of Birth/Sex: Treating RN: 04/01/1941 (82 y.o. Female) [name] Primary Care Provider: Jenne Pane, [facility] Other Clinician: Referring  Provider: Treating Provider/Extender: Fredirick Maudlin SHO Carlynn Spry, [facility] Weeks in Treatment: 5 Debridement Performed for Assessment: Wound #5 Left T Second oe Performed By: Physician Fredirick Maudlin, MD Debridement Type: Debridement Level of Consciousness (Pre-procedure): Awake and Alert Pre-procedure Verification/Time Out Yes - 13:40 Taken: Start Time: 13:40 Pain Control: Lidocaine 4% T opical Solution T Area Debrided (L x W): otal 0.2 (cm) x 0.2 (cm) = 0.04 (cm) Tissue and other material debrided: Non-Viable, Slough, Slough Level: Non-Viable Tissue [name], [name] ([id number]) [id number] Page 3 of 11 Debridement Description: Selective/Open Wound Instrument: Curette Bleeding: Large Hemostasis Achieved: Pressure End Time: 13:42 Procedural Pain: 0 Post Procedural Pain: 0 Response to Treatment: Procedure was tolerated well Level of Consciousness (Post- Awake and Alert procedure): Post Debridement Measurements of Total Wound Length: (cm) 0.2 Width: (cm) 0.2 Depth: (cm) 0.1 Volume: (cm) 0.003 Character of Wound/Ulcer Post Debridement: Improved Post Procedure Diagnosis Same as Pre-procedure Notes Scribed for Dr.  Celine Ahr by J.Scotton Electronic Signature(s) Signed: 07/19/2022 2:16:52 PM By: Fredirick Maudlin MD FACS Signed: 07/19/2022 4:51:27 PM By: Kathryn Catholic RN Entered By: [name] on 07/19/2022 14:02:19 -------------------------------------------------------------------------------- HPI Details Patient Name: Date of Service: [name], [facility] RO N 07/19/2022 12:45 PM Medical Record Number: [id number] Patient Account Number: [id number] Date of Birth/Sex: Treating RN: 02-Apr-1941 (82 y.o. Female) Primary Care Provider: Jenne Pane, [facility] Other Clinician: Referring Provider: Treating Provider/Extender: Devoria Albe, [facility] Weeks in Treatment: 5 History of Present Illness HPI Description: ADMISSION 06/12/2022 This is an 82 year old woman with a history of CVA, crest syndrome, atrial fibrillation, rocker-bottom foot deformity. 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 She has been followed by podiatry for this and I am not entirely clear as to how she came to be referred here. She resides in an assisted living facility. It is not clear what they have been putting on her wound, but on intake, she was noted to have denuded skin on her medial third toe, as well as ulcers on her dorsal great toe and lateral great toe. There is slough accumulation on both of the toe ulcers. There was an odor noted at intake, but after her foot was washed, the odor dissipated. Her toes are folded on top of each other creating areas of abrasion and pockets for moisture collection, which seems to be the primary cause of her ulceration. 06/20/2022: The skin between her toes and on the ball of her foot is completely macerated. There has been more tissue breakdown. The wound on her great toe has some slough accumulation. 06/27/2022: No change to her wounds today. There has been no further deterioration, but no significant improvement. She was both hypotensive and  bradycardic on intake. 07/11/2022: Today, her foot is completely macerated. She reports that the wound care nurse actually soaked her foot and then applied foam, despite our specific orders to not use foam at all. She also is draining serous fluid from both legs and has 2+ pitting edema. She is on furosemide 20 mg twice a day. 07/19/2022: Once again, her foot is completely macerated. There has been further tissue breakdown to the second and third toes and she now has ulcers on the distal ends. The wounds on her medial and lateral great toe have thick slough accumulation. Apparently Xeroform was found between her toes on intake. Edema control is improved, but still not perfect. No overt drainage from her legs appreciated on exam today. Electronic Signature(s) Signed: 07/19/2022 2:09:03 PM By: Fredirick Maudlin MD FACS  Entered By: Fredirick Maudlin on 07/19/2022 14:09:03 -------------------------------------------------------------------------------- Physical Exam Details Patient Name: Date of Service: [name] 07/19/2022 12:45 PM Medical Record Number: [id number] Patient Account Number: [id number] [name], [name] ([id number]) [phone].pdf Page 4 of 11 Date of Birth/Sex: Treating RN: [birth year] (82 y.o. Female) Primary Care Provider: Jenne Pane, [facility] Other Clinician: Referring Provider: Treating Provider/Extender: Fredirick Maudlin SHO KES, [facility] Weeks in Treatment: 5 Constitutional . . . . no acute distress. Respiratory Normal work of breathing on room air. Notes 07/19/2022: Once again, her foot is completely macerated. There has been further tissue breakdown to the second and third toes and she now has ulcers on the distal ends. The wounds on her medial and lateral great toe have thick slough accumulation. Edema control is improved, but still not perfect. No overt drainage from her legs appreciated on exam today. Electronic Signature(s) Signed: 07/19/2022  2:09:48 PM By: Fredirick Maudlin MD FACS Entered By: Fredirick Maudlin on 07/19/2022 14:09:47 -------------------------------------------------------------------------------- Physician Orders Details Patient Name: Date of Service: [name] [facility] RO N 07/19/2022 12:45 PM Medical Record Number: [id number] Patient Account Number: [id number] Date of Birth/Sex: Treating RN: [birth year] (82 y.o. 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 - Dr. Celine Ahr - Room 2 Return A Other: - DO NOT USE FOAM BETWEEN TOES - LEAVE COMPRESSION WRAPS ON UNTIL NEXT WEEK, ONLY NEED TO CHANGE TOE DRESSING, PLEASE FOLLOW ORDERS ON WHAT TO USE Anesthetic (In clinic) Topical Lidocaine 4% applied to wound bed Bathing/ Shower/ Hygiene May shower and wash wound with soap and water. - keep toes and in between very dry. Use Silver Alginate between toes Edema Control - Lymphedema / SCD / Other Elevate legs to the level of the heart or above for 30 minutes daily and/or when sitting for 3-4 times a day throughout the day. Non Wound Condition Other Non Wound Condition Orders/Instructions: - ZINC Unna boot on right lower leg - leave compression wraps on legs until next wound care appointment, only need to change the dressing on the toes (if needed for the right toes) Wound  Treatment Wound #1 - T Great oe Wound Laterality: Dorsal, Left Cleanser: Soap and Water 1 x Per Day/30 Days Discharge Instructions: May shower and wash wound with dial antibacterial soap and water prior to dressing change. Cleanser: Wound Cleanser 1 x Per Day/30 Days Discharge Instructions: Cleanse the wound with wound cleanser prior to applying a clean dressing using gauze sponges, not tissue or cotton balls. Prim Dressing: Sorbalgon AG Dressing 2x2 (in/in) 1 x Per Day/30 Days ary Discharge Instructions: Weave the silver alginate between the left toes [name], [name] ([id number].pdf Page 5 of 11 Secondary Dressing: Woven Gauze Sponge, Non-Sterile 4x4 in 1 x Per Day/30 Days Discharge Instructions: can use gauze between toes (if no desired) Wound #2 - T Great oe Wound Laterality: Left, Medial Cleanser: Soap and Water 1 x Per Day/30 Days Discharge Instructions: May shower and wash wound with dial antibacterial soap and  water prior to dressing change. Cleanser: Wound Cleanser 1 x Per Day/30 Days Discharge Instructions: Cleanse the wound with wound cleanser prior to applying a clean dressing using gauze sponges, not tissue or cotton balls. Prim Dressing: Sorbalgon AG Dressing 2x2 (in/in) 1 x Per Day/30 Days ary Discharge Instructions: Weave the silver alginate between the left toes Secondary Dressing: Woven Gauze Sponge, Non-Sterile 4x4 in 1 x Per Day/30 Days Discharge Instructions: can use gauze between toes (if no desired) Wound #3 - T Third oe Wound Laterality: Left, Medial Cleanser: Soap and Water 1 x Per Day/30 Days Discharge Instructions: May shower and wash wound with dial antibacterial soap and water prior to dressing change. Cleanser: Wound Cleanser 1 x Per Day/30 Days Discharge Instructions: Cleanse the wound with wound cleanser prior to applying a clean dressing using gauze sponges, not tissue or cotton balls. Prim Dressing: Sorbalgon AG  Dressing 2x2 (in/in) 1 x Per Day/30 Days ary Discharge Instructions: Weave the silver alginate between the left toes Secondary Dressing: Woven Gauze Sponge, Non-Sterile 4x4 in 1 x Per Day/30 Days Discharge Instructions: can use gauze between toes (if no desired) Wound #4 - Foot Wound Laterality: Plantar, Left Cleanser: Soap and Water 1 x Per Week/30 Days Discharge Instructions: May shower and wash wound with dial antibacterial soap and water prior to dressing change. Cleanser: Wound Cleanser 1 x Per Week/30 Days Discharge Instructions: Cleanse the wound with wound cleanser prior to applying a clean dressing using gauze sponges, not tissue or cotton balls. Peri-Wound Care: Zinc Oxide Ointment 30g tube 1 x Per Week/30 Days Discharge Instructions: Apply Zinc Oxide to periwound with each dressing change Prim Dressing: Sorbalgon AG Dressing 2x2 (in/in) 1 x Per Week/30 Days ary Discharge Instructions: Weave the silver alginate between the left toes Secondary Dressing: Woven Gauze Sponge, Non-Sterile 4x4 in 1 x Per Week/30 Days Discharge Instructions: can use gauze between toes (if no desired) Compression Wrap: CoFlex Zinc Unna Boot, 4 x 6 (in/yd) 1 x Per Week/30 Days Discharge Instructions: Left Lower leg Coflex Zinc Unna Boot or Use Zinc Unna boot Wound #5 - T Second oe Wound Laterality: Left Cleanser: Soap and Water 1 x Per Day/30 Days Discharge Instructions: May shower and wash wound with dial antibacterial soap and water prior to dressing change. Cleanser: Wound Cleanser 1 x Per Day/30 Days Discharge Instructions: Cleanse the wound with wound cleanser prior to applying a clean dressing using gauze sponges, not tissue or cotton balls. Prim Dressing: Sorbalgon AG Dressing 2x2 (in/in) 1 x Per Day/30 Days ary Discharge Instructions: Weave the silver alginate between the left toes Secondary Dressing: Woven Gauze Sponge, Non-Sterile 4x4 in 1 x Per Day/30 Days Discharge Instructions: can use  gauze between toes (if no desired) Electronic Signature(s) Signed: 07/19/2022 4:10:17 PM By: Fredirick Maudlin MD FACS Signed: 07/19/2022 4:51:27 PM By: Kathryn Catholic RN Previous Signature: 07/19/2022 2:58:03 PM Version By: Fredirick Maudlin MD FACS Entered By: [name] on 07/19/2022 15:08:53 Problem List Details -------------------------------------------------------------------------------- [name] ([id number].pdf Page 6 of 11 Patient Name: Date of Service: [name] 07/19/2022 12:45 PM Medical Record Number: [id number] Patient Account Number: [id number] Date of Birth/Sex: Treating RN: 1940-09-25 (82 y.o. 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 06/12/2022 No Yes 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 06/12/2022 No Yes 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 atrial fibrillation 06/12/2022 No Yes Z79.01 Long term (current) use of anticoagulants 06/12/2022 No Yes Inactive Problems Resolved Problems Electronic Signature(s) Signed: 07/19/2022 2:07:25 PM By: Fredirick Maudlin MD FACS Entered By: Fredirick Maudlin on 07/19/2022 14:07:25 -------------------------------------------------------------------------------- Progress Note Details Patient Name: Date of Service: [name], [facility] RO N 07/19/2022 12:45 PM Medical Record Number: [id number] Patient Account Number: [id number] Date of Birth/Sex: Treating RN: 1941/04/12 (82 y.o.  Female) Primary Care Provider: Jenne Pane, [facility] Other Clinician: Referring Provider: Treating Provider/Extender: Devoria Albe, [facility] Weeks in Treatment: 5 Subjective Chief Complaint Information obtained from Patient Patient seen for complaints of Non-Healing Wound. History of Present Illness (HPI) ADMISSION 06/12/2022 This is an 82 year old woman with a history of CVA, crest syndrome, atrial fibrillation, rocker-bottom foot deformity. 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 She has been followed by podiatry for this and I am not entirely clear as to how she came to be referred here. She resides in an assisted living facility. It is not clear what they have been putting on her wound, but on intake, she was noted to have denuded skin on her [name], [name] ([id number]) [id number] Page 7 of 11 medial third toe, as well as ulcers on her dorsal great toe and lateral great toe. There is slough accumulation on both of the toe ulcers. There was an odor noted at intake, but after her foot was washed, the odor dissipated. Her toes are folded on top of each other creating areas of abrasion and pockets for moisture collection, which seems to be the primary cause of her ulceration. 06/20/2022: The skin between her toes and on the ball of her foot is completely macerated. There has been more tissue breakdown. The wound on her great toe has some slough accumulation. 06/27/2022: No change to her wounds today. There has been no further deterioration, but no significant improvement. She was both hypotensive and bradycardic on intake. 07/11/2022: Today, her foot is completely macerated. She reports that the wound care nurse actually soaked her foot and then applied foam, despite our specific orders to not use foam at all. She also is draining serous fluid from both legs and has 2+ pitting edema. She is on furosemide 20 mg twice  a day. 07/19/2022: Once again, her foot is completely macerated. There has been further tissue breakdown to the second and third toes and she now has ulcers on the distal ends. The wounds on her medial and lateral great toe have thick slough accumulation. Apparently Xeroform was found between her toes on intake. Edema control is improved, but still not perfect. No overt drainage from her legs appreciated on exam today. Patient History Information obtained from Patient. Family History Unknown History. Social History Former smoker - smoked when she was young, Marital Status - Widowed, Alcohol Use - Never, Drug Use - No History, Caffeine Use - Daily. Medical History Hematologic/Lymphatic Patient has history of Anemia Cardiovascular Patient has history of Angina - a-fib, Hypertension, Vasculitis Endocrine Denies history of Type I Diabetes, Type II Diabetes Musculoskeletal Patient has history of Osteoarthritis Hospitalization/Surgery History - cholecystectomy. - leg surgery. - tonsillectomy. 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 Vitals Time Taken: 12:58 PM, Height: 67 in, Weight: 153 lbs, BMI: 24, Temperature: 97.6 F, Pulse: 98 bpm, Respiratory Rate: 20 breaths/min, Blood Pressure: 127/76 mmHg. Respiratory Normal work of breathing on room air. General Notes: 07/19/2022: Once again, her foot is completely macerated. There has been further tissue breakdown to the second and third toes and she now has ulcers on the distal ends. The wounds on her medial and lateral great toe have thick slough accumulation. Edema control is improved, but still not perfect. No overt drainage from her legs appreciated on exam today. Integumentary (Hair, Skin) Wound #1 status is Open. Original cause of wound was Footwear Injury. The date acquired was:  05/24/2021. The wound has been in treatment 5 weeks. The wound is located on the Left,Dorsal T Great. The wound measures 0.6cm length x 0.6cm width x 0.1cm depth; 0.283cm^2 area and 0.028cm^3 volume. There oe is Fat Layer (Subcutaneous Tissue) exposed. There is no tunneling or undermining noted. There is a medium amount of serous drainage noted. The wound margin is distinct with the outline attached to the wound base. There is medium (34-66%) pink, pale granulation within the wound bed. There is a medium (34- 66%) amount of necrotic tissue within the wound bed including Adherent Slough. The periwound skin appearance exhibited: Scarring, Maceration. The periwound skin appearance did not exhibit: Dry/Scaly, Rubor. Periwound temperature was noted as No Abnormality. Wound #2 status is Open. Original cause of wound was Shear/Friction. The date acquired was: 05/24/2021. The wound is located on the Left,Medial T [place]. The oe wound measures 0.7cm length x 0.8cm width x 0.1cm depth; 0.44cm^2 area and 0.044cm^3 volume. There is Fat Layer (Subcutaneous Tissue) exposed. There is no tunneling or undermining noted. There is a medium amount of serosanguineous drainage noted. There is medium (34-66%) pink, pale granulation within the wound bed. There is a medium (34-66%) amount of necrotic tissue within the wound bed including Adherent Slough. The periwound skin appearance had no abnormalities noted for color. The periwound skin appearance exhibited: Scarring, Maceration. The periwound skin appearance did not exhibit: Dry/Scaly. Periwound temperature was noted as No Abnormality. Wound #3 status is Open. Original cause of wound was Shear/Friction. The date acquired was: 05/24/2021. The wound has been in treatment 5 weeks. The wound [place], [place] ([id number]) [id number] Page 8 of 11 is located on the Left,Medial T Third. The wound measures 0.4cm length x 0.7cm width x 0.1cm depth; 0.22cm^2  area and 0.022cm^3 volume. There is Fat oe Layer (Subcutaneous Tissue) exposed. There is no tunneling or undermining noted. There is a medium amount of serous drainage noted. The wound margin is distinct with the outline attached to the wound base. There is medium (34-66%) pink, pale granulation within the wound bed. There is a medium (34-66%) amount of necrotic tissue within the wound bed including Adherent Slough. The periwound skin appearance had no abnormalities noted for texture. The periwound skin appearance had no abnormalities noted for color. The periwound skin appearance exhibited: Maceration. The periwound skin appearance did not exhibit: Dry/Scaly. Periwound temperature was noted as No Abnormality. Wound #4 status is Open. Original cause of wound was Skin T ear/Laceration. The date acquired was: 06/20/2022. The wound has been in treatment 4 weeks. The wound is located on the [place]. The wound measures 0.1cm length x 0.1cm width x 0.1cm depth; 0.008cm^2 area and 0.001cm^3 volume. There is Fat Layer (Subcutaneous Tissue) exposed. There is no tunneling or undermining noted. There is a none present  amount of drainage noted. The wound margin is distinct with the outline attached to the wound base. There is medium (34-66%) pink, pale granulation within the wound bed. There is a medium (34-66%) amount of necrotic tissue within the wound bed including Adherent Slough. The periwound skin appearance had no abnormalities noted for color. The periwound skin appearance exhibited: Scarring, Maceration. The periwound skin appearance did not exhibit: Dry/Scaly. Periwound temperature was noted as No Abnormality. Wound #5 status is Open. Original cause of wound was Gradually Appeared. The date acquired was: 07/13/2022. The wound is located on the Left T Second. oe The wound measures 0.2cm length x 0.2cm width x 0.1cm depth; 0.031cm^2 area and 0.003cm^3 volume. There is Fat Layer (Subcutaneous  Tissue) exposed. There is no tunneling or undermining noted. There is a medium amount of serosanguineous drainage noted. There is large (67-100%) red granulation within the wound bed. There is a small (1-33%) amount of necrotic tissue within the wound bed including Adherent Slough. The periwound skin appearance had no abnormalities noted for color. The periwound skin appearance exhibited: Scarring, Maceration. Periwound temperature was noted as No Abnormality. 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 There was a Selective/Open Wound Non-Viable Tissue Debridement oe with a total area of 0.36 sq cm performed by Fredirick Maudlin, MD. With the following instrument(s): Curette to remove Non-Viable tissue/material. Material removed includes [facility] after achieving pain control using Lidocaine 4% T opical Solution. No specimens were taken. A time out was conducted at 13:40, prior to the start of the procedure. A Large amount of bleeding was controlled with Pressure. The procedure was tolerated well with a pain level of 0 throughout and a pain level of 0 following the procedure. Post Debridement Measurements: 0.6cm length x 0.6cm width x 0.1cm depth; 0.028cm^3 volume. Character of Wound/Ulcer Post Debridement is improved. Post procedure Diagnosis Wound #1: Same as Pre-Procedure General Notes: Scribed for Dr. Celine Ahr by J.Scotton. Wound #3 Pre-procedure diagnosis of Wound #3 is an Abrasion located on the Left,Medial T Third . There was a Selective/Open  Wound Non-Viable Tissue Debridement oe with a total area of 0.28 sq cm performed by Fredirick Maudlin, MD. With the following instrument(s): Curette to remove Non-Viable tissue/material. Material removed includes [facility] after achieving pain control using Lidocaine 4% T opical Solution. No specimens were taken. A time out was conducted at 13:40, prior to the start of the procedure. A Large amount of bleeding was controlled with Pressure. The procedure was tolerated well with a pain level of 0 throughout and a pain level of 0 following the procedure. Post Debridement Measurements: 0.4cm length x 0.7cm width x 0.1cm depth; 0.022cm^3 volume. Character of Wound/Ulcer Post Debridement is improved. Post procedure Diagnosis Wound #3: Same as Pre-Procedure General Notes: Scribed for Dr. Celine Ahr by J.Scotton. Wound #5 Pre-procedure diagnosis of Wound #5 is an Abrasion located on the Left T Second . There was a Selective/Open Wound Non-Viable Tissue Debridement with oe a total area of 0.04 sq cm performed by Fredirick Maudlin, MD. With the following instrument(s): Curette to remove Non-Viable tissue/material. Material removed includes [facility] after achieving pain control using  Lidocaine 4% T opical Solution. No specimens were taken. A time out was conducted at 13:40, prior to the start of the procedure. A Large amount of bleeding was controlled with Pressure. The procedure was tolerated well with a pain level of 0 throughout and a pain level of 0 following the procedure. Post Debridement Measurements: 0.2cm length x 0.2cm width x 0.1cm depth; 0.003cm^3 volume. Character of Wound/Ulcer Post Debridement is improved. Post procedure Diagnosis Wound #5: Same as Pre-Procedure General Notes: Scribed for Dr. Celine Ahr by J.Scotton. Wound #4 Pre-procedure diagnosis of Wound #4 is a Skin T located on the Left,Plantar Foot . There was a Haematologist Compression Therapy Procedure by Bartholomew Crews, RN. Post procedure  Diagnosis Wound #4: Same as Pre-Procedure Notes: Zinc UNNA Boot. Plan Follow-up Appointments: Return Appointment in 1 week. - Dr. Celine Ahr - Room 2 Other: - DO NOT [place] WRAPS ON UNTIL NEXT WEEK, [place] TO CHANGE TOE DRESSING, [place], [place] ([id number]) [id number] Page 9 of 11 PLEASE FOLLOW ORDERS ON WHAT TO USE Anesthetic: (In clinic) Topical Lidocaine 4% applied to wound bed Bathing/ Shower/ Hygiene: May shower and wash wound with soap and water. - keep toes and in between very dry. Use Silver Alginate between toes Edema Control - Lymphedema / SCD / Other: Elevate legs to the level of the heart or above for 30 minutes daily and/or when sitting for 3-4 times a day throughout the day. Non Wound Condition: Other Non Wound Condition Orders/Instructions: - ZINC Unna boot on right lower leg - leave compression wraps on legs until next wound care appointment, only need to change the dressing on the toes (if needed for the right toes) WOUND #1: - T Great Wound Laterality: Dorsal, Left oe Cleanser: Soap and Water 1 x Per Day/30 Days Discharge Instructions: May shower and wash wound with dial antibacterial soap and water prior to dressing change. Cleanser: Wound Cleanser 1 x Per Day/30 Days Discharge Instructions: Cleanse the wound with wound cleanser prior to applying a clean dressing using gauze sponges, not tissue or cotton balls. Prim Dressing: Sorbalgon AG Dressing 2x2 (in/in) 1 x Per Day/30 Days ary Discharge Instructions: Weave the silver alginate between the left toes Secondary Dressing: Woven Gauze Sponge, Non-Sterile 4x4 in 1 x Per Day/30 Days Discharge Instructions: can use gauze between toes (if no desired) WOUND #2: - T Great Wound Laterality: Left, Medial oe Cleanser: Soap and Water 1 x Per Day/30 Days Discharge Instructions: May shower and wash wound with dial antibacterial soap and water prior to dressing  change. Cleanser: Wound Cleanser 1 x Per Day/30 Days Discharge Instructions: Cleanse the wound with wound cleanser prior to applying a clean dressing using gauze sponges, not tissue or cotton balls. Prim Dressing: Sorbalgon AG Dressing 2x2 (in/in) 1 x Per Day/30 Days ary Discharge Instructions: Weave the silver alginate between the left toes Secondary Dressing: Woven Gauze Sponge, Non-Sterile 4x4 in 1 x Per Day/30 Days Discharge Instructions: can use gauze between toes (if no desired) WOUND #3: - T Third Wound Laterality: Left, Medial oe Cleanser: Soap and Water 1 x Per Day/30 Days Discharge Instructions: May shower and wash wound with dial antibacterial soap and water prior to dressing change. Cleanser: Wound Cleanser 1 x Per Day/30 Days Discharge Instructions: Cleanse the wound with wound cleanser prior to applying a clean dressing using gauze sponges, not tissue or cotton balls. Prim Dressing: Sorbalgon AG Dressing 2x2 (in/in) 1 x Per Day/30 Days ary Discharge Instructions: Weave  the silver alginate between the left toes Secondary Dressing: Woven Gauze Sponge, Non-Sterile 4x4 in 1 x Per Day/30 Days Discharge Instructions: can use gauze between toes (if no desired) WOUND #4: - Foot Wound Laterality: Plantar, Left Cleanser: Soap and Water 1 x Per Week/30 Days Discharge Instructions: May shower and wash wound with dial antibacterial soap and water prior to dressing change. Cleanser: Wound Cleanser 1 x Per Week/30 Days Discharge Instructions: Cleanse the wound with wound cleanser prior to applying a clean dressing using gauze sponges, not tissue or cotton balls. Peri-Wound Care: Zinc Oxide Ointment 30g tube 1 x Per Week/30 Days Discharge Instructions: Apply Zinc Oxide to periwound with each dressing change Prim Dressing: Sorbalgon AG Dressing 2x2 (in/in) 1 x Per Week/30 Days ary Discharge Instructions: Weave the silver alginate between the left toes Secondary Dressing: Woven Gauze Sponge,  Non-Sterile 4x4 in 1 x Per Week/30 Days Discharge Instructions: can use gauze between toes (if no desired) Com pression Wrap: CoFlex Zinc Unna Boot, 4 x 6 (in/yd) 1 x Per Week/30 Days Discharge Instructions: Left Lower leg Coflex Zinc Unna Boot or Use Zinc Unna boot WOUND #5: - T Second Wound Laterality: Left oe Cleanser: Soap and Water 1 x Per Day/30 Days Discharge Instructions: May shower and wash wound with dial antibacterial soap and water prior to dressing change. Cleanser: Wound Cleanser 1 x Per Day/30 Days Discharge Instructions: Cleanse the wound with wound cleanser prior to applying a clean dressing using gauze sponges, not tissue or cotton balls. Prim Dressing: Sorbalgon AG Dressing 2x2 (in/in) 1 x Per Day/30 Days ary Discharge Instructions: Weave the silver alginate between the left toes Secondary Dressing: Woven Gauze Sponge, Non-Sterile 4x4 in 1 x Per Day/30 Days Discharge Instructions: can use gauze between toes (if no desired) 07/19/2022: Once again, her foot is completely macerated. There has been further tissue breakdown to the second and third toes and she now has ulcers on the distal ends. The wounds on her medial and lateral great toe have thick slough accumulation. Edema control is improved, but still not perfect. No overt drainage from her legs appreciated on exam today. I used a curette to debride the slough from her second and third toes, as well as both wounds on her great toe. I am a bit at a loss as to why the wound care nurses are having such a difficult time complying with the orders and instructions that are being written. We will continue silver alginate to the wounds as well as between her toes. Continue zinc-based compression wrapping bilaterally. Follow-up in 1 week. Electronic Signature(s) Signed: 07/23/2022 7:40:07 AM By: Fredirick Maudlin MD FACS Previous Signature: 07/19/2022 2:12:40 PM Version By: Fredirick Maudlin MD FACS Entered By: Fredirick Maudlin on  07/23/2022 07:40:06 -------------------------------------------------------------------------------- HxROS Details Patient Name: Date of Service: [name] [name], [facility] RO N 07/19/2022 12:45 PM Medical Record Number: [id number] Patient Account Number: [id number] Date of Birth/Sex: Treating RN: 31-Oct-1940 (82 y.o. Female) Primary Care Provider: Jenne Pane, [facility] Other Clinician: Referring Provider: Treating Provider/Extender: Devoria Albe, [place], [place] ([id number]) [id number] Page 10 of 11 Weeks in Treatment: 5 Information Obtained From Patient Hematologic/Lymphatic Medical History: Positive for: Anemia Cardiovascular Medical History: Positive for: Angina - a-fib; Hypertension; 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 Endocrine Medical History: Negative for: Type I Diabetes; Type II Diabetes Past Medical History Notes: hypothyroidism Musculoskeletal Medical History: Positive for: 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 Neurologic Medical History: Past Medical History Notes: stroke Immunizations Pneumococcal Vaccine: Received  Pneumococcal Vaccination: Yes Received Pneumococcal Vaccination On or After 60th Birthday: Yes Implantable Devices None Hospitalization / Surgery History Type of Hospitalization/Surgery cholecystectomy leg surgery tonsillectomy Family and Social History Unknown History: Yes; Former smoker - smoked when she was young; Marital Status - Widowed; Alcohol Use: Never; Drug Use: No History; Caffeine Use: Daily; Financial Concerns: No; Food, Clothing or Shelter Needs: No; Support System Lacking: No; Transportation Concerns: No Electronic Signature(s) Signed: 07/19/2022 2:16:52 PM By: Fredirick Maudlin MD FACS Entered By: Fredirick Maudlin on 07/19/2022 14:09:08 -------------------------------------------------------------------------------- SuperBill Details Patient Name: Date of  Service: [name], [facility] RO N 07/19/2022 Medical Record Number: [id number] Patient Account Number: [id number] Date of Birth/Sex: Treating RN: 27-May-1940 (82 y.o. Female) Primary Care Provider: Jenne Pane, [facility] Other Clinician: Referring Provider: Treating Provider/Extender: Devoria Albe, [facility] Weeks in Treatment: 5 Diagnosis Coding ICD-10 Codes [place], [place] ([id number]) [id number] Page 11 of 11 Code Description [id number] Non-pressure chronic ulcer of other part of left foot with fat layer exposed L97.521 Non-pressure chronic ulcer of other part of left foot limited to breakdown of skin I10 Essential (primary) hypertension 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 atrial fibrillation Z79.01 Long term (current) use of anticoagulants Facility Procedures : CPT4 Code: [id number] Description: [id number] - DEBRIDE WOUND 1ST 20 SQ CM OR < ICD-10 Diagnosis Description L97.522 Non-pressure chronic ulcer of other part of left foot with fat layer exposed L97.521 Non-pressure chronic ulcer of other part of left foot limited to breakdown of  sk Modifier: in Quantity: 1 Physician Procedures : CPT4 Code Description Modifier [id number] - WC PHYS LEVEL 3 - EST PT 25 ICD-10 Diagnosis Description L97.522 Non-pressure chronic ulcer of other part of left foot with fat layer exposed L97.521 Non-pressure chronic ulcer of other part of left foot  limited to breakdown of skin 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 I10 Essential (primary) hypertension Quantity: 1 : [id number] - WC PHYS DEBR WO ANESTH 20 SQ CM ICD-10 Diagnosis Description L97.522 Non-pressure chronic ulcer of other part of left foot with fat layer exposed L97.521 Non-pressure chronic ulcer of other part of left foot limited to breakdown of skin Quantity: 1 Electronic Signature(s) Signed: 07/19/2022 2:13:10 PM By: Fredirick Maudlin MD  FACS Entered By: Fredirick Maudlin on 07/19/2022 14:13:09

## 2022-07-26 ENCOUNTER — Encounter (HOSPITAL_BASED_OUTPATIENT_CLINIC_OR_DEPARTMENT_OTHER): Payer: Medicare Other | Admitting: General Surgery

## 2022-07-27 ENCOUNTER — Encounter (HOSPITAL_BASED_OUTPATIENT_CLINIC_OR_DEPARTMENT_OTHER): Payer: Medicare Other | Attending: General Surgery | Admitting: General Surgery

## 2022-07-27 DIAGNOSIS — I48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 atrial fibrillation: Secondary | ICD-10-CM | POA: Insufficient documentation

## 2022-07-27 DIAGNOSIS — Z7901 Long term (current) use of anticoagulants: Secondary | ICD-10-CM | POA: Insufficient documentation

## 2022-07-27 DIAGNOSIS — M341 CR(E)ST syndrome: Secondary | ICD-10-CM | POA: Diagnosis not present

## 2022-07-27 DIAGNOSIS — L97429 Non-pressure chronic ulcer of left heel and midfoot with unspecified severity: Secondary | ICD-10-CM | POA: Insufficient documentation

## 2022-07-27 DIAGNOSIS — I890 Lymphedema, not elsewhere classified: Secondary | ICD-10-CM | POA: Diagnosis not present

## 2022-07-27 DIAGNOSIS — I10 Essential (primary) hypertension: Secondary | ICD-10-CM | POA: Diagnosis not present

## 2022-07-27 DIAGNOSIS — M1990 Unspecified osteoarthritis, unspecified site: Secondary | ICD-10-CM | POA: Diagnosis not present

## 2022-07-27 DIAGNOSIS — L97522 Non-pressure chronic ulcer of other part of left foot with fat layer exposed: Secondary | ICD-10-CM | POA: Insufficient documentation

## 2022-07-28 NOTE — Progress Notes (Signed)
[name] ([id number]) [id number] Page 1 of 13 Visit Report for 07/27/2022 Chief Complaint Document Details Patient Name: Date of Service: [name] 07/27/2022 1:30 PM Medical Record Number: [id number] Patient Account Number: [id number] Date of Birth/Sex: Treating RN: 09-20-1940 (82 y.o. F) Primary Care Provider: [name], [facility] Other Clinician: Referring Provider: Treating Provider/Extender: [name], [facility] Weeks in Treatment: 6 Information Obtained from: Patient Chief Complaint Patient seen for complaints of Non-Healing Wound. Electronic Signature(s) Signed: 07/27/2022 2:27:42 PM By: [name] MD FACS Entered By: [name] on 07/27/2022 14:27:42 -------------------------------------------------------------------------------- Debridement Details Patient Name: Date of Service: [name], [facility] [name] 07/27/2022 1:30 PM Medical Record Number: [id number] Patient Account Number: [id number] Date of Birth/Sex: Treating RN: 16-Apr-1941 (82 y.o. [name] Primary Care Provider: [name], [facility] Other Clinician: Referring Provider: Treating Provider/Extender: [name], [facility] Weeks in Treatment: 6 Debridement Performed for Assessment: Wound #1 Left,Dorsal T Great oe Performed By: Physician [name], MD Debridement Type: Debridement Level of Consciousness (Pre-procedure): Awake and Alert Pre-procedure Verification/Time Out Yes - 14:09 Taken: Start Time: 14:09 Pain Control: Lidocaine 5% topical ointment T Area Debrided (L x W): otal 0.6 (cm) x 0.7 (cm) = 0.42 (cm) Tissue and other material debrided: Non-Viable, Slough, Slough Level: Non-Viable Tissue Debridement Description: Selective/Open Wound Instrument: Curette Bleeding: Minimum Hemostasis Achieved: Pressure End Time: 14:11 Procedural Pain: 0 Post Procedural Pain: 0 Response to Treatment: Procedure was tolerated well Level of  Consciousness (Post- Awake and Alert procedure): Post Debridement Measurements of Total Wound Length: (cm) 0.6 Width: (cm) 0.7 Depth: (cm) 0.1 Volume: (cm) 0.033 Character of Wound/Ulcer Post Debridement: Improved Post Procedure Diagnosis Same as Pre-procedure Notes Scribed for Dr. Lady Gary by J.Scotton Electronic Signature(s) Signed: 07/27/2022 2:42:06 PM By: [name] MD FACS Signed: 07/27/2022 5:11:14 PM By: Karie Schwalbe RN [name], [name] ([id number]) [phone].pdf Page 2 of 13 Entered By: Karie Schwalbe on 07/27/2022 14:14:33 -------------------------------------------------------------------------------- Debridement Details Patient Name: Date of Service: [name] 07/27/2022 1:30 PM Medical Record Number: [id number] Patient Account Number: [id number] Date of Birth/Sex: Treating RN: [birth year] (82 y.o. [name] Primary Care Provider: [name], [facility] Other Clinician: Referring Provider: Treating Provider/Extender: [name], [facility] Weeks in Treatment: 6 Debridement Performed for Assessment: Wound #2 Left,Medial T Great oe Performed By: Physician [name], MD Debridement Type: Debridement Level of Consciousness (Pre-procedure): Awake and Alert Pre-procedure Verification/Time Out Yes - 14:09 Taken: Start Time: 14:09 Pain Control: Lidocaine 5% topical ointment T Area Debrided (L x W): otal 0.7 (cm) x 0.8 (cm) = 0.56 (cm) Tissue and other material debrided: Non-Viable, Slough, Slough Level: Non-Viable Tissue Debridement Description: Selective/Open Wound Instrument: Curette Bleeding: Minimum Hemostasis Achieved: Pressure End Time: 14:11 Procedural Pain: 0 Post Procedural Pain: 0 Response to Treatment: Procedure was tolerated well Level of Consciousness (Post- Awake and Alert procedure): Post Debridement Measurements of Total Wound Length: (cm) 0.7 Width: (cm) 0.8 Depth: (cm) 0.1 Volume: (cm)  0.044 Character of Wound/Ulcer Post Debridement: Improved Post Procedure Diagnosis Same as Pre-procedure Notes Scribed for Dr. Lady Gary by J.Scotton Electronic Signature(s) Signed: 07/27/2022 2:42:06 PM By: [name] MD FACS Signed: 07/27/2022 5:11:14 PM By: Karie Schwalbe RN Entered By: Karie Schwalbe on 07/27/2022 14:16:08 -------------------------------------------------------------------------------- Debridement Details Patient Name: Date of Service: [name], [facility] [name] 07/27/2022 1:30 PM Medical Record Number: [id number] Patient Account Number: [id number] Date of Birth/Sex: Treating RN: January 28, 1941 (82 y.o. [name] Primary Care Provider: [name], [facility] Other Clinician: Referring Provider: Treating  Provider/Extender: [name] SHO [name], [facility] Weeks in Treatment: 6 Debridement Performed for Assessment: Wound #3 Left,Medial T Third oe Performed By: Physician [name], MD Debridement Type: Debridement Level of Consciousness (Pre-procedure): Awake and Alert Pre-procedure Verification/Time Out Yes - 14:09 Taken: Start Time: 14:09 Pain Control: Lidocaine 5% topical ointment T Area Debrided (L x W): otal 1 (cm) x 0.5 (cm) = 0.5 (cm) Tissue and other material debrided: Non-Viable, Slough, Slough Level: Non-Viable Tissue [name] ([id number]) [id number] Page 3 of 13 Debridement Description: Selective/Open Wound Instrument: Curette Bleeding: Minimum Hemostasis Achieved: Pressure End Time: 14:11 Procedural Pain: 0 Post Procedural Pain: 0 Response to Treatment: Procedure was tolerated well Level of Consciousness (Post- Awake and Alert procedure): Post Debridement Measurements of Total Wound Length: (cm) 1 Width: (cm) 0.5 Depth: (cm) 0.1 Volume: (cm) 0.039 Character of Wound/Ulcer Post Debridement: Improved Post Procedure Diagnosis Same as Pre-procedure Notes Scribed for Dr. Lady Gary by J.Scotton Electronic  Signature(s) Signed: 07/27/2022 2:42:06 PM By: [name] MD FACS Signed: 07/27/2022 5:11:14 PM By: Karie Schwalbe RN Entered By: Karie Schwalbe on 07/27/2022 14:17:00 -------------------------------------------------------------------------------- Debridement Details Patient Name: Date of Service: [name], [facility] [name] 07/27/2022 1:30 PM Medical Record Number: [id number] Patient Account Number: [id number] Date of Birth/Sex: Treating RN: 09-20-1940 (82 y.o. [name] Primary Care Provider: [name], [facility] Other Clinician: Referring Provider: Treating Provider/Extender: [name], [facility] Weeks in Treatment: 6 Debridement Performed for Assessment: Wound #4 Left,Plantar Foot Performed By: Physician [name], MD Debridement Type: Debridement Level of Consciousness (Pre-procedure): Awake and Alert Pre-procedure Verification/Time Out Yes - 14:09 Taken: Start Time: 14:09 Pain Control: Lidocaine 5% topical ointment T Area Debrided (L x W): otal 0.1 (cm) x 0.1 (cm) = 0.01 (cm) Tissue and other material debrided: Non-Viable, Slough, Slough Level: Non-Viable Tissue Debridement Description: Selective/Open Wound Instrument: Curette Bleeding: Minimum Hemostasis Achieved: Pressure End Time: 14:11 Procedural Pain: 0 Post Procedural Pain: 0 Response to Treatment: Procedure was tolerated well Level of Consciousness (Post- Awake and Alert procedure): Post Debridement Measurements of Total Wound Length: (cm) 0.1 Width: (cm) 0.1 Depth: (cm) 0.1 Volume: (cm) 0.001 Character of Wound/Ulcer Post Debridement: Improved Post Procedure Diagnosis Same as Pre-procedure Notes Scribed for Dr. Lady Gary by J.Scotton [name], [name] ([id number]) [id number] Page 4 of 13 Electronic Signature(s) Signed: 07/27/2022 2:42:06 PM By: [name] MD FACS Signed: 07/27/2022 5:11:14 PM By: Karie Schwalbe RN Entered By: Karie Schwalbe on 07/27/2022  14:18:19 -------------------------------------------------------------------------------- Debridement Details Patient Name: Date of Service: [name], [facility] [name] 07/27/2022 1:30 PM Medical Record Number: [id number] Patient Account Number: [id number] Date of Birth/Sex: Treating RN: 1940/04/28 (82 y.o. [name] Primary Care Provider: [name], [facility] Other Clinician: Referring Provider: Treating Provider/Extender: [name], [facility] Weeks in Treatment: 6 Debridement Performed for Assessment: Wound #5 Left T Second oe Performed By: Physician [name], MD Debridement Type: Debridement Level of Consciousness (Pre-procedure): Awake and Alert Pre-procedure Verification/Time Out Yes - 14:09 Taken: Start Time: 14:09 Pain Control: Lidocaine 5% topical ointment T Area Debrided (L x W): otal 0.2 (cm) x 0.2 (cm) = 0.04 (cm) Tissue and other material debrided: Non-Viable, Slough, Slough Level: Non-Viable Tissue Debridement Description: Selective/Open Wound Instrument: Curette Bleeding: Minimum Hemostasis Achieved: Pressure End Time: 14:11 Procedural Pain: 0 Post Procedural Pain: 0 Response to Treatment: Procedure was tolerated well Level of Consciousness (Post- Awake and Alert procedure): Post Debridement Measurements of Total Wound Length: (cm) 0.2 Width: (cm) 0.2 Depth: (cm) 0.1 Volume: (cm) 0.003 Character of Wound/Ulcer Post Debridement: Improved  Post Procedure Diagnosis Same as Pre-procedure Notes Scribed for Dr. Lady Garyannon by J.Scotton Electronic Signature(s) Signed: 07/27/2022 2:42:06 PM By: Duanne Guessannon, Mckinsey Keagle MD FACS Signed: 07/27/2022 5:11:14 PM By: Karie [name], Joanne RN Entered By: Karie [name], [name] on 07/27/2022 14:22:28 -------------------------------------------------------------------------------- HPI Details Patient Name: Date of Service: [name], [name] 07/27/2022 1:30 PM Medical Record Number: [id number] Patient Account Number: [id number] Date of  Birth/Sex: Treating RN: 11-17-1940 (82 y.o. F) Primary Care Provider: [facility]SHO [facility] Other Clinician: Referring Provider: Treating Provider/Extender: Roosevelt Locksannon, Regena Delucchi SHO KES, [facility] Weeks in Treatment: 6 History of Present Illness HPI Description: ADMISSION 06/12/2022 This is an 82 year old woman with a history of CVA, crest syndrome, atrial fibrillation, rocker-bottom foot deformity. She apparently developed an ulcer on her Marsh DollyCALDWELL, Alaila ([id number]) [id number] Page 5 of 13 left great toe secondary to her AFO prosthetic. She has been followed by podiatry for this and I am not entirely clear as to how she came to be referred here. She resides in an assisted living facility. It is not clear what they have been putting on her wound, but on intake, she was noted to have denuded skin on her medial third toe, as well as ulcers on her dorsal great toe and lateral great toe. There is slough accumulation on both of the toe ulcers. There was an odor noted at intake, but after her foot was washed, the odor dissipated. Her toes are folded on top of each other creating areas of abrasion and pockets for moisture collection, which seems to be the primary cause of her ulceration. 06/20/2022: The skin between her toes and on the ball of her foot is completely macerated. There has been more tissue breakdown. The wound on her great toe has some slough accumulation. 06/27/2022: No change to her wounds today. There has been no further deterioration, but no significant improvement. She was both hypotensive and bradycardic on intake. 07/11/2022: Today, her foot is completely macerated. She reports that the wound care nurse actually soaked her foot and then applied foam, despite our specific orders to not use foam at all. She also is draining serous fluid from both legs and has 2+ pitting edema. She is on furosemide 20 mg twice a day. 07/19/2022: Once again, her foot is completely macerated.  There has been further tissue breakdown to the second and third toes and she now has ulcers on the distal ends. The wounds on her medial and lateral great toe have thick slough accumulation. Apparently Xeroform was found between her toes on intake. Edema control is improved, but still not perfect. No overt drainage from her legs appreciated on exam today. 07/27/2022: She has less tissue maceration today. Edema control in her bilateral lower extremities is improved. Still with slough accumulation on all open wound surfaces. Electronic Signature(s) Signed: 07/27/2022 2:28:23 PM By: Duanne Guessannon, Yaiden Yang MD FACS Entered By: Duanne Guessannon, Dvonte Gatliff on 07/27/2022 14:28:23 -------------------------------------------------------------------------------- Physical Exam Details Patient Name: Date of Service: Kathryn HammondCA Joseph, [facility] [name] 07/27/2022 1:30 PM Medical Record Number: [id number] Patient Account Number: [id number] Date of Birth/Sex: Treating RN: 11-17-1940 (82 y.o. F) Primary Care Provider[facility]SHO [facility]ES[facility] Other Clinician: Referring Provider: Treating Provider/Extender: Duanne Guessannon, Jamielyn Petrucci SHO KES, [facility] Weeks in Treatment: 6 Constitutional . . . . no acute distress. Respiratory Normal work of breathing on room air. Notes 07/27/2022: She has less tissue maceration today. Edema control in her bilateral lower extremities is improved. Still with slough accumulation on all open wound surfaces. Electronic Signature(s) Signed: 07/27/2022 2:28:52 PM By: Duanne Guessannon, Riannah Stagner MD FACS Entered  By: [name] on 07/27/2022 14:28:52 -------------------------------------------------------------------------------- Physician Orders Details Patient Name: Date of Service: [name] [facility] [name] 07/27/2022 1:30 PM Medical Record Number: [id number] Patient Account Number: [id number] Date of Birth/Sex: Treating RN: Jan 02, 1941 (82 y.o. 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 - Dr. Lady Gary - Room 2 Return A Other: - DO NOT USE FOAM BETWEEN TOES - LEAVE COMPRESSION WRAPS ON UNTIL NEXT WEEK, ONLY NEED TO CHANGE TOE DRESSING, PLEASE FOLLOW ORDERS ON WHAT TO USE Anesthetic (In clinic) Topical Lidocaine 4% applied to wound bed Bathing/ Shower/ Hygiene May shower and wash wound with soap and water. - keep toes and in between very dry. Use Silver Alginate between toes Edema Control - Lymphedema / SCD / Other Elevate legs to the level of the heart or above for 30 minutes daily and/or when sitting for 3-4 times a day throughout the day. Non Wound Condition Other Non Wound Condition Orders/Instructions: - ZINC Unna boot on right lower leg - leave compression wraps on legs until next wound care appointment, only need to change the dressing on the toes (if needed for the right toes) Wound Treatment Wound #1 - T Great oe Wound Laterality: Dorsal, Left Cleanser: Soap and Water 1 x Per Day/30 Days Discharge Instructions: May shower and wash wound with dial antibacterial soap and water prior to dressing change. Cleanser: Wound Cleanser 1 x Per Day/30 Days Discharge Instructions: Cleanse the wound with wound cleanser prior to applying a clean dressing using gauze sponges, not tissue or cotton balls. Prim  Dressing: Sorbalgon AG Dressing 2x2 (in/in) 1 x Per Day/30 Days ary Discharge Instructions: Weave the silver alginate between the left toes Secondary Dressing: Woven Gauze Sponge, Non-Sterile 4x4 in 1 x Per Day/30 Days Discharge Instructions: can use gauze between toes (if no desired) Wound #2 - T Great oe Wound Laterality: Left, Medial Cleanser: Soap and Water 1 x Per Day/30 Days Discharge Instructions: May shower and wash wound with dial antibacterial soap and water prior to dressing change. Cleanser: Wound Cleanser 1 x Per Day/30 Days Discharge Instructions: Cleanse the wound with wound cleanser prior to applying a clean dressing using gauze sponges, not tissue or cotton balls. Prim Dressing: Sorbalgon AG Dressing 2x2 (in/in) 1 x Per Day/30 Days ary Discharge Instructions: Weave the silver alginate between the left toes Secondary Dressing: Woven Gauze Sponge, Non-Sterile 4x4 in 1 x Per Day/30 Days Discharge Instructions: can use gauze between toes (if no desired) Wound #3 - T Third oe Wound Laterality: Left, Medial Cleanser: Soap and Water 1 x Per Day/30 Days Discharge Instructions: May shower and wash wound with dial antibacterial soap and water prior to dressing change. Cleanser: Wound Cleanser 1 x Per Day/30 Days Discharge Instructions: Cleanse the wound with wound cleanser prior to applying a clean dressing using gauze sponges, not tissue or cotton balls. Prim Dressing: Sorbalgon AG Dressing 2x2 (in/in) 1 x Per Day/30 Days  ary Discharge Instructions: Weave the silver alginate between the left toes Secondary Dressing: Woven Gauze Sponge, Non-Sterile 4x4 in 1 x Per Day/30 Days Discharge Instructions: can use gauze between toes (if no desired) Wound #4 - Foot Wound Laterality: Plantar, Left Cleanser: Soap and Water 1 x Per Week/30 Days Discharge Instructions: May shower and wash wound with dial antibacterial soap and water prior to dressing change. Cleanser: Wound Cleanser 1 x Per  Week/30 Days Discharge Instructions: Cleanse the wound with wound cleanser prior to applying a clean dressing using gauze sponges, not tissue or cotton balls. Peri-Wound Care: Zinc Oxide Ointment 30g tube 1 x Per Week/30 Days Discharge Instructions: Apply Zinc Oxide to periwound with each dressing change Prim Dressing: Sorbalgon AG Dressing 2x2 (in/in) 1 x Per Week/30 Days ary Discharge Instructions: Weave the silver alginate between the left toes Secondary Dressing: Woven Gauze Sponge, Non-Sterile 4x4 in 1 x Per Week/30 Days Discharge Instructions: can use gauze between toes (if no desired) Skillen, Becca ([id number]) [id number] Page 7 of 13 Compression Wrap: CoFlex Zinc Unna Boot, 4 x 6 (in/yd) 1 x Per Week/30 Days Discharge Instructions: Left Lower leg Coflex Zinc Unna Boot or Use Zinc Unna boot Wound #5 - T Second oe Wound Laterality: Left Cleanser: Soap and Water 1 x Per Day/30 Days Discharge Instructions: May shower and wash wound with dial antibacterial soap and water prior to dressing change. Cleanser: Wound Cleanser 1 x Per Day/30 Days Discharge Instructions: Cleanse the wound with wound cleanser prior to applying a clean dressing using gauze sponges, not tissue or cotton balls. Prim Dressing: Sorbalgon AG Dressing 2x2 (in/in) 1 x Per Day/30 Days ary Discharge Instructions: Weave the silver alginate between the left toes Secondary Dressing: Woven Gauze Sponge, Non-Sterile 4x4 in 1 x Per Day/30 Days Discharge Instructions: can use gauze between toes (if no desired) Electronic Signature(s) Signed: 07/27/2022 3:34:51 PM By: [name] MD FACS Signed: 07/27/2022 5:11:14 PM By: Karie Schwalbe RN Entered By: Karie Schwalbe on 07/27/2022 14:54:37 -------------------------------------------------------------------------------- Problem List Details Patient Name: Date of Service: [name], [facility] [name] 07/27/2022 1:30 PM Medical Record Number:  [id number] Patient Account Number: [id number] Date of Birth/Sex: Treating RN: Feb 24, 1941 (82 y.o. 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 06/12/2022 No Yes 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 06/12/2022 No Yes 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 atrial fibrillation 06/12/2022 No Yes Z79.01 Long term (current) use of anticoagulants 06/12/2022 No Yes Inactive Problems Resolved Problems [name], [name] ([id number]) [id number] Page 8 of 13 Electronic Signature(s) Signed: 07/27/2022 2:27:30 PM By: [name] MD FACS Entered By: [name] on 07/27/2022 14:27:30 -------------------------------------------------------------------------------- Progress Note Details Patient Name: Date of Service: [name], [facility] [name] 07/27/2022 1:30 PM Medical Record Number: [id number] Patient Account Number: [id number] Date of Birth/Sex: Treating RN: 07-Jan-1941 (82 y.o. F) Primary Care Provider: [name], [facility] Other Clinician: Referring Provider: Treating Provider/Extender: [name], [facility] Weeks in Treatment: 6 Subjective Chief Complaint Information obtained from Patient Patient seen for complaints of Non-Healing Wound. History of Present Illness (HPI) ADMISSION 06/12/2022 This is an 82 year old woman with  a history of CVA, crest syndrome, atrial fibrillation, rocker-bottom foot deformity. She apparently developed an ulcer on her left great toe  secondary to her AFO prosthetic. She has been followed by podiatry for this and I am not entirely clear as to how she came to be referred here. She resides in an assisted living facility. It is not clear what they have been putting on her wound, but on intake, she was noted to have denuded skin on her medial third toe, as well as ulcers on her dorsal great toe and lateral great toe. There is slough accumulation on both of the toe ulcers. There was an odor noted at intake, but after her foot was washed, the odor dissipated. Her toes are folded on top of each other creating areas of abrasion and pockets for moisture collection, which seems to be the primary cause of her ulceration. 06/20/2022: The skin between her toes and on the ball of her foot is completely macerated. There has been more tissue breakdown. The wound on her great toe has some slough accumulation. 06/27/2022: No change to her wounds today. There has been no further deterioration, but no significant improvement. She was both hypotensive and bradycardic on intake. 07/11/2022: Today, her foot is completely macerated. She reports that the wound care nurse actually soaked her foot and then applied foam, despite our specific orders to not use foam at all. She also is draining serous fluid from both legs and has 2+ pitting edema. She is on furosemide 20 mg twice a day. 07/19/2022: Once again, her foot is completely macerated. There has been further tissue breakdown to the second and third toes and she now has ulcers on the distal ends. The wounds on her medial and lateral great toe have thick slough accumulation. Apparently Xeroform was found between her toes on intake. Edema control is improved, but still not perfect. No overt drainage from her legs appreciated on exam today. 07/27/2022: She has less tissue maceration today. Edema control in her bilateral lower extremities is improved. Still with slough accumulation on all open  wound surfaces. Patient History Information obtained from Patient. Family History Unknown History. Social History Former smoker - smoked when she was young, Marital Status - Widowed, Alcohol Use - Never, Drug Use - No History, Caffeine Use - Daily. Medical History Hematologic/Lymphatic Patient has history of Anemia Cardiovascular Patient has history of Angina - a-fib, Hypertension, Vasculitis Endocrine Denies history of Type I Diabetes, Type II Diabetes Musculoskeletal Patient has history of Osteoarthritis Hospitalization/Surgery History - cholecystectomy. - leg surgery. - tonsillectomy. 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 Vitals Time Taken: 1:30 AM, Height: 67 in, Weight: 153 lbs, BMI: 24, Temperature: 97.6 F, Pulse: 98 bpm, Respiratory Rate: 20 breaths/min, Blood Pressure: 128/80 mmHg. Respiratory Normal work of breathing on room air. General Notes: 07/27/2022: She has less tissue maceration today. Edema control in her bilateral lower extremities is improved. Still with slough accumulation on all open wound surfaces. Integumentary (Hair, Skin) Wound #1 status is Open. Original cause of wound was Footwear Injury. The date acquired was: 05/24/2021. The wound has been in treatment 6 weeks. The wound is located on the Left,Dorsal T Great. The wound measures 0.6cm length x 0.7cm width x 0.1cm depth; 0.33cm^2 area and 0.033cm^3 volume. There oe is Fat Layer (Subcutaneous Tissue) exposed. There is no tunneling or undermining noted. There is  a medium amount of serous drainage noted. The wound margin is distinct with the outline attached to the wound base. There is medium (34-66%) pink, pale granulation within the wound bed. There is a medium  (34- 66%) amount of necrotic tissue within the wound bed including Adherent Slough. The periwound skin appearance exhibited: Scarring, Maceration. The periwound skin appearance did not exhibit: Dry/Scaly, Rubor. Periwound temperature was noted as No Abnormality. Wound #2 status is Open. Original cause of wound was Shear/Friction. The date acquired was: 05/24/2021. The wound has been in treatment 1 weeks. The wound is located on the Left,Medial T [place]. The wound measures 0.7cm length x 0.8cm width x 0.1cm depth; 0.44cm^2 area and 0.044cm^3 volume. There is Fat oe Layer (Subcutaneous Tissue) exposed. There is no tunneling or undermining noted. There is a medium amount of serosanguineous drainage noted. There is medium (34-66%) pink, pale granulation within the wound bed. There is a medium (34-66%) amount of necrotic tissue within the wound bed including Adherent Slough. The periwound skin appearance had no abnormalities noted for color. The periwound skin appearance exhibited: Scarring, Maceration. The periwound skin appearance did not exhibit: Dry/Scaly. Periwound temperature was noted as No Abnormality. Wound #3 status is Open. Original cause of wound was Shear/Friction. The date acquired was: 05/24/2021. The wound has been in treatment 6 weeks. The wound is located on the Left,Medial T Third. The wound measures 1cm length x 0.5cm width x 0.1cm depth; 0.393cm^2 area and 0.039cm^3 volume. There is Fat oe Layer (Subcutaneous Tissue) exposed. There is no tunneling or undermining noted. There is a medium amount of serous drainage noted. The wound margin is distinct with the outline attached to the wound base. There is medium (34-66%) pink, pale granulation within the wound bed. There is a medium (34-66%) amount of necrotic tissue within the wound bed including Adherent Slough. The periwound skin appearance had no abnormalities noted for texture. The periwound skin appearance had no abnormalities noted for  color. The periwound skin appearance exhibited: Maceration. The periwound skin appearance did not exhibit: Dry/Scaly. Periwound temperature was noted as No Abnormality. Wound #4 status is Open. Original cause of wound was Skin T ear/Laceration. The date acquired was: 06/20/2022. The wound has been in treatment 5 weeks. The wound is located on the Left,Plantar Foot. The wound measures 0.1cm length x 0.1cm width x 0.1cm depth; 0.008cm^2 area and 0.001cm^3 volume. There is Fat Layer (Subcutaneous Tissue) exposed. There is no tunneling or undermining noted. There is a none present amount of drainage noted. The wound margin is distinct with the outline attached to the wound base. There is medium (34-66%) pink, pale granulation within the wound bed. There is a medium (34-66%) amount of necrotic tissue within the wound bed including Adherent Slough. The periwound skin appearance had no abnormalities noted for color. The periwound skin appearance exhibited: Scarring, Maceration. The periwound skin appearance did not exhibit: Dry/Scaly. Periwound temperature was noted as No Abnormality. Wound #5 status is Open. Original cause of wound was Gradually Appeared. The date acquired was: 07/13/2022. The wound has been in treatment 1 weeks. The wound is located on the Left T Second. The wound measures 0.2cm length x 0.2cm width x 0.1cm depth; 0.031cm^2 area and 0.003cm^3 volume. There is oe Fat Layer (Subcutaneous Tissue) exposed. There is no tunneling or undermining noted. There is a medium amount of serosanguineous drainage noted. There is large (67-100%) red granulation within the wound bed. There is a small (1-33%) amount of necrotic tissue within the wound  bed including Adherent Slough. The periwound skin appearance had no abnormalities noted for color. The periwound skin appearance exhibited: Scarring, Maceration. Periwound temperature was noted as No Abnormality. 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 There was a Selective/Open Wound Non-Viable Tissue Debridement oe with a total area of 0.42 sq cm performed by [name], MD. With the following instrument(s): Curette to remove Non-Viable tissue/material. Material removed includes [facility] after achieving pain control using Lidocaine 5% topical ointment. No specimens were taken. A time out was conducted at 14:09, prior to the start of the procedure. A Minimum amount of bleeding was controlled with Pressure. The procedure was tolerated well with a pain level of 0 throughout and a pain level of 0 following the procedure. Post Debridement Measurements: 0.6cm length x 0.7cm width x 0.1cm depth; 0.033cm^3 volume. Character of Wound/Ulcer Post Debridement is improved. Post procedure Diagnosis Wound #1: Same as Pre-Procedure General Notes: Scribed for Dr. Lady Gary by J.Scotton. [name], [name] ([id number]) [id number] Page 10 of 13 Wound #2 Pre-procedure diagnosis of Wound #2 is an Abrasion located on the Left,Medial T Great . There was a Selective/Open Wound Non-Viable Tissue Debridement oe with a total area of 0.56 sq cm performed by [name], MD. With the following instrument(s): Curette to remove Non-Viable tissue/material. Material removed includes [facility] after achieving pain control using Lidocaine 5% topical ointment. No specimens were taken. A time out was conducted at 14:09, prior to the start of the procedure. A Minimum amount of bleeding was controlled  with Pressure. The procedure was tolerated well with a pain level of 0 throughout and a pain level of 0 following the procedure. Post Debridement Measurements: 0.7cm length x 0.8cm width x 0.1cm depth; 0.044cm^3 volume. Character of Wound/Ulcer Post Debridement is improved. Post procedure Diagnosis Wound #2: Same as Pre-Procedure General Notes: Scribed for Dr. Lady Gary by J.Scotton. Wound #3 Pre-procedure diagnosis of Wound #3 is an Abrasion located on the Left,Medial T Third . There was a Selective/Open Wound Non-Viable Tissue Debridement oe with a total area of 0.5 sq cm performed by [name], MD. With the following instrument(s): Curette to remove Non-Viable tissue/material. Material removed includes [facility] after achieving pain control using Lidocaine 5% topical ointment. No specimens were taken. A time out was conducted at 14:09, prior to the start of the procedure. A Minimum amount of bleeding was controlled with Pressure. The procedure was tolerated well with a pain level of 0 throughout and a pain level of 0 following the procedure. Post Debridement Measurements: 1cm length x 0.5cm width x 0.1cm depth; 0.039cm^3 volume. Character of Wound/Ulcer Post Debridement is improved. Post procedure Diagnosis Wound #3: Same as Pre-Procedure General Notes: Scribed for Dr. Lady Gary by J.Scotton. Wound #4 Pre-procedure diagnosis of Wound #4 is a Skin T located on the Left,Plantar Foot . There was a Selective/Open Wound Non-Viable Tissue Debridement with ear a total area of 0.01 sq cm performed by [name], MD. With the following instrument(s): Curette to remove Non-Viable tissue/material. Material removed includes [facility] after achieving pain control using Lidocaine 5% topical ointment. No specimens were taken. A time out was conducted  at 14:09, prior to the start of the procedure. A Minimum amount of bleeding was controlled with Pressure. The procedure was tolerated well with a pain level of  0 throughout and a pain level of 0 following the procedure. Post Debridement Measurements: 0.1cm length x 0.1cm width x 0.1cm depth; 0.001cm^3 volume. Character of Wound/Ulcer Post Debridement is improved. Post procedure Diagnosis Wound #4: Same as Pre-Procedure General Notes: Scribed for Dr. Lady Gary by J.Scotton. Pre-procedure diagnosis of Wound #4 is a Skin T located on the Left,Plantar Foot . There was a Radio broadcast assistant Compression Therapy Procedure by Lupe Carney, RN. Post procedure Diagnosis Wound #4: Same as Pre-Procedure Notes: Zinc Unna Boot. Wound #5 Pre-procedure diagnosis of Wound #5 is an Abrasion located on the Left T Second . There was a Selective/Open Wound Non-Viable Tissue Debridement with oe a total area of 0.04 sq cm performed by [name], MD. With the following instrument(s): Curette to remove Non-Viable tissue/material. Material removed includes [facility] after achieving pain control using Lidocaine 5% topical ointment. No specimens were taken. A time out was conducted at 14:09, prior to the start of the procedure. A Minimum amount of bleeding was controlled with Pressure. The procedure was tolerated well with a pain level of 0 throughout and a pain level of 0 following the procedure. Post Debridement Measurements: 0.2cm length x 0.2cm width x 0.1cm depth; 0.003cm^3 volume. Character of Wound/Ulcer Post Debridement is improved. Post procedure Diagnosis Wound #5: Same as Pre-Procedure General Notes: Scribed for Dr. Lady Gary by J.Scotton. Plan Follow-up Appointments: Return Appointment in 1 week. - Dr. Lady Gary - Room 2 Other: - DO NOT USE FOAM BETWEEN TOES - LEAVE COMPRESSION WRAPS ON UNTIL NEXT WEEK, ONLY NEED TO CHANGE TOE DRESSING, PLEASE FOLLOW ORDERS ON WHAT TO USE Anesthetic: (In clinic) Topical Lidocaine 4% applied to wound bed Bathing/ Shower/ Hygiene: May shower and wash wound with soap and water. - keep toes and in between very dry. Use Silver Alginate  between toes Edema Control - Lymphedema / SCD / Other: Elevate legs to the level of the heart or above for 30 minutes daily and/or when sitting for 3-4 times a day throughout the day. Non Wound Condition: Other Non Wound Condition Orders/Instructions: - ZINC Unna boot on right lower leg - leave compression wraps on legs until next wound care appointment, only need to change the dressing on the toes (if needed for the right toes) WOUND #1: - T Great Wound Laterality: Dorsal, Left oe Cleanser: Soap and Water 1 x Per Day/30 Days Discharge Instructions: May shower and wash wound with dial antibacterial soap and water prior to dressing change. Cleanser: Wound Cleanser 1 x Per Day/30 Days Discharge Instructions: Cleanse the wound with wound cleanser prior to applying a clean dressing using gauze sponges, not tissue or cotton balls. Prim Dressing: Sor[employer] Dressing 2x2 (in/in) 1 x Per Day/30 Days ary Discharge Instructions: Weave the silver alginate between the left toes Secondary Dressing: Woven Gauze Sponge, Non-Sterile 4x4 in 1 x Per Day/30 Days Discharge Instructions: can use gauze between toes (if no desired) WOUND #2: - T Great Wound Laterality: Left, Medial oe Cleanser: Soap and Water 1 x Per Day/30 Days Discharge Instructions: May shower and wash wound with dial antibacterial soap and water prior to dressing change. Cleanser: Wound Cleanser 1 x Per Day/30 Days Discharge Instructions: Cleanse the wound with wound cleanser prior to applying a clean dressing using gauze sponges, not tissue or cotton balls. Prim Dressing: [employer] 2x2 (in/in)  1 x Per Day/30 Days ary Discharge Instructions: Weave the silver alginate between the left toes Secondary Dressing: Woven Gauze Sponge, Non-Sterile 4x4 in 1 x Per Day/30 Days Discharge Instructions: can use gauze between toes (if no desired) WOUND #3: - T Third Wound Laterality: Left, Medial oe Cleanser: Soap and Water 1 x Per Day/30  Days Discharge Instructions: May shower and wash wound with dial antibacterial soap and water prior to dressing change. Cleanser: Wound Cleanser 1 x Per Day/30 Days Discharge Instructions: Cleanse the wound with wound cleanser prior to applying a clean dressing using gauze sponges, not tissue or cotton balls. Prim Dressing: [employer] Dressing 2x2 (in/in) 1 x Per Day/30 Days ary Discharge Instructions: Weave the silver alginate between the left toes Secondary Dressing: Woven Gauze Sponge, Non-Sterile 4x4 in 1 x Per Day/30 Days Georgiades, Tamberlyn ([id number]) [phone].pdf Page 11 of 13 Discharge Instructions: can use gauze between toes (if no desired) WOUND #4: - Foot Wound Laterality: Plantar, Left Cleanser: Soap and Water 1 x Per Week/30 Days Discharge Instructions: May shower and wash wound with dial antibacterial soap and water prior to dressing change. Cleanser: Wound Cleanser 1 x Per Week/30 Days Discharge Instructions: Cleanse the wound with wound cleanser prior to applying a clean dressing using gauze sponges, not tissue or cotton balls. Peri-Wound Care: Zinc Oxide Ointment 30g tube 1 x Per Week/30 Days Discharge Instructions: Apply Zinc Oxide to periwound with each dressing change Prim Dressing: [employer] Dressing 2x2 (in/in) 1 x Per Week/30 Days ary Discharge Instructions: Weave the silver alginate between the left toes Secondary Dressing: Woven Gauze Sponge, Non-Sterile 4x4 in 1 x Per Week/30 Days Discharge Instructions: can use gauze between toes (if no desired) Com pression Wrap: CoFlex Zinc Unna Boot, 4 x 6 (in/yd) 1 x Per Week/30 Days Discharge Instructions: Left Lower leg Coflex Zinc Unna Boot or Use Zinc Unna boot WOUND #5: - T Second Wound Laterality: Left oe Cleanser: Soap and Water 1 x Per Day/30 Days Discharge Instructions: May shower and wash wound with dial antibacterial soap and water prior to dressing change. Cleanser: Wound Cleanser 1  x Per Day/30 Days Discharge Instructions: Cleanse the wound with wound cleanser prior to applying a clean dressing using gauze sponges, not tissue or cotton balls. Prim Dressing: [employer] Dressing 2x2 (in/in) 1 x Per Day/30 Days ary Discharge Instructions: Weave the silver alginate between the left toes Secondary Dressing: Woven Gauze Sponge, Non-Sterile 4x4 in 1 x Per Day/30 Days Discharge Instructions: can use gauze between toes (if no desired) 07/27/2022: She has less tissue maceration today. Edema control in her bilateral lower extremities is improved. Still with slough accumulation on all open wound surfaces. I used a curette to debride the slough off of all of the open wound surfaces. We will continue to apply silver alginate to the wounds, as well as we have it between her toes. Continue periwound zinc oxide and bilateral zinc based Unna boots. She needs to continue to elevate her legs during the day and she should not, under any circumstances, be permitted to sleep in her wheelchair with her legs in a dependent position. These recommendations were communicated to the aide who accompanies her, as well as written on the order sheets from her facility. Follow-up in 1 week. Electronic Signature(s) Signed: 08/01/2022 4:32:21 PM By: [name] MD FACS Signed: 08/01/2022 4:38:46 PM By: Shawn Stall RN, BSN Previous Signature: 07/27/2022 2:32:37 PM Version By: [name] MD FACS Entered By: Shawn Stall on 08/01/2022 16:31:26 --------------------------------------------------------------------------------  HxROS Details Patient Name: Date of Service: [name] 07/27/2022 1:30 PM Medical Record Number: [id number] Patient Account Number: [id number] Date of Birth/Sex: Treating RN: [birth year] (82 y.o. F) Primary Care Provider: [name], [facility] Other Clinician: Referring Provider: Treating Provider/Extender: [name], [facility] Weeks in Treatment: 6 Information  Obtained From Patient Hematologic/Lymphatic Medical History: Positive for: Anemia Cardiovascular Medical History: Positive for: Angina - a-fib; Hypertension; 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 Endocrine Medical History: Negative for: Type I Diabetes; Type II Diabetes Past Medical History Notes: hypothyroidism Musculoskeletal Medical History: Positive for: 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 [name], [name] ([id number]) [phone].pdf Page 12 of 13 Neurologic Medical History: Past Medical History Notes: stroke Immunizations Pneumococcal Vaccine: Received Pneumococcal Vaccination: Yes Received Pneumococcal Vaccination On or After 60th Birthday: Yes Implantable Devices None Hospitalization / Surgery History Type of Hospitalization/Surgery cholecystectomy leg surgery tonsillectomy Family and Social History Unknown History: Yes; Former smoker - smoked when she was young; Marital Status - Widowed; Alcohol Use: Never; Drug Use: No History; Caffeine Use: Daily; Financial Concerns: No; Food, Clothing or Shelter Needs: No; Support System Lacking: No; Transportation Concerns: No Electronic Signature(s) Signed: 07/27/2022 2:42:06 PM By: [name] MD FACS Entered By: [name] on 07/27/2022 35:36:14 -------------------------------------------------------------------------------- SuperBill Details Patient Name: Date of Service: [name], [facility] [name] 07/27/2022 Medical Record Number: [id number] Patient Account Number: [id number] Date of Birth/Sex: Treating RN: 1940-05-11 (82 y.o. F) Primary Care Provider: [name], [facility] Other Clinician: Referring Provider: Treating Provider/Extender: [name], [facility] Weeks in Treatment: 6 Diagnosis Coding ICD-10 Codes Code Description [phone] Non-pressure chronic ulcer of other part of left foot with fat layer exposed L97.521 Non-pressure chronic  ulcer of other part of left foot limited to breakdown of skin I10 Essential (primary) hypertension 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 atrial fibrillation Z79.01 Long term (current) use of anticoagulants Facility Procedures : CPT4 Code: [id number] Description: 97597 - DEBRIDE WOUND 1ST 20 SQ CM OR < ICD-10 Diagnosis Description L97.522 Non-pressure chronic ulcer of other part of left foot with fat layer exposed Modifier: Quantity: 1 Physician Procedures : CPT4 Code Description Modifier [id number] - WC PHYS LEVEL 3 - EST PT 25 ICD-10 Diagnosis Description L97.522 Non-pressure chronic ulcer of other part of left foot with fat layer exposed L97.521 Non-pressure chronic ulcer of other part of left foot  limited to breakdown of skin 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 Slaven, Cleone ([id number]) [id number] 97597 - WC PHYS DEBR WO ANESTH 20 SQ CM 1 ICD-10  Diagnosis Description L97.522 Non-pressure chronic ulcer of other part of left foot with fat layer exposed Quantity: 1 27.pdf Page 13 of 13 Electronic Signature(s) Signed: 07/27/2022 2:33:07 PM By: [name] MD FACS Entered By: [name] on 07/27/2022 14:33:07

## 2022-07-31 NOTE — Progress Notes (Signed)
Kathryn, Joseph ([id number]) [id number] Page 1 of 13 Visit Report for 07/27/2022 Arrival Information Details Patient Name: Date of Service: [name] [name] 07/27/2022 1:30 PM Medical Record Number: [id number] Patient Account Number: [id number] Date of Birth/Sex: Treating RN: [birth year] (82 y.o. F) Primary Care [name]: [name], [facility] Other Clinician: Referring [name]: Treating [name]/Extender: [name], [facility] Weeks in Treatment: 6 Visit Information History Since Last Visit Added or deleted any medications: No Patient Arrived: Wheel Chair Any new allergies or adverse reactions: No Arrival Time: 13:30 Had a fall or experienced change in No Accompanied By: caregiver activities of daily living that Kathryn affect Transfer Assistance: None risk of falls: Patient Identification Verified: Yes Signs or symptoms of abuse/neglect since last visito No Secondary Verification Process Completed: Yes Hospitalized since last visit: No Implantable device outside of the clinic excluding No cellular tissue based products placed in the center since last visit: Has Dressing in Place as Prescribed: Yes Pain Present Now: Yes Electronic Signature(s) Signed: 07/31/2022 12:48:39 PM By: Karl Ito Entered By: Karl Ito on 07/27/2022 13:30:36 -------------------------------------------------------------------------------- Compression Therapy Details Patient Name: Date of Service: [name] [facility] [name] 07/27/2022 1:30 PM Medical Record Number: [id number] Patient Account Number: [id number] Date of Birth/Sex: Treating RN: [name] (82 y.o. [name] Primary Care Sol Odor: [name], [facility] Other Clinician: Referring [name]: Treating Kamira Mellette/Extender: [name] SHO [name], [facility] Weeks in Treatment: 6 Compression Therapy Performed for Wound Assessment: Wound #4 Left,Plantar Foot Performed By: Clinician Kathryn Schwalbe, RN Compression Type:  [name] Post Procedure Diagnosis Same as Pre-procedure Notes Zinc [name] Electronic Signature(s) Signed: 07/27/2022 5:11:14 PM By: Kathryn Schwalbe RN Entered By: [name] on 07/27/2022 14:36:27 -------------------------------------------------------------------------------- Encounter Discharge Information Details Patient Name: Date of Service: [name], [facility] [name] 07/27/2022 1:30 PM Medical Record Number: [id number] Patient Account Number: [id number] Date of Birth/Sex: Treating RN: [birth year] (82 y.o. [name] Primary Care Eh Sauseda: [name], [facility] Other Clinician: Referring [name]: Treating [name]/Extender: [name], [facility] Weeks in Treatment: 6 Encounter Discharge Information Items Post Procedure Vitals Discharge Condition: Stable Temperature (F): 97.6 Ambulatory Status: Wheelchair Pulse (bpm): 98 Joseph, Kathryn ([id number]) [id number] Page 2 of 13 Discharge Destination: Home Respiratory Rate (breaths/min): 20 Transportation: Private Auto Blood Pressure (mmHg): 128/80 Accompanied By: driver Schedule Follow-up Appointment: Yes Clinical Summary of Care: Patient Declined Electronic Signature(s) Signed: 07/27/2022 5:11:14 PM By: Kathryn Schwalbe RN Entered By: [name] on 07/27/2022 17:10:34 -------------------------------------------------------------------------------- Lower Extremity Assessment Details Patient Name: Date of Service: [name] [name] 07/27/2022 1:30 PM Medical Record Number: [id number] Patient Account Number: [id number] Date of Birth/Sex: Treating RN: Joseph[birth year] (82 y.o. [name] Primary Care Yeiden Frenkel: [name], [facility] Other Clinician: Referring [name]: Treating [name]/Extender: [name] SHO KES, [facility] Weeks in Treatment: 6 Edema Assessment Assessed: [Left: No] [Right: No] [Left: Edema] [Right: :] Calf Left: Right: Point of Measurement: From Medial Instep 36.5  cm Ankle Left: Right: Point of Measurement: From Medial Instep 23.5 cm Vascular Assessment Pulses: Dorsalis Pedis Palpable: [Left:Yes] Electronic Signature(s) Signed: 07/27/2022 5:11:14 PM By: Kathryn Schwalbe RN Entered By: [name] on 07/27/2022 14:03:31 -------------------------------------------------------------------------------- Multi Wound Chart Details Patient Name: Date of Service: [name], [facility] [name] 07/27/2022 1:30 PM Medical Record Number: [id number] Patient Account Number: [id number] Date of Birth/Sex: Treating RN: [birth year] Joseph (82 y.o. F) Primary Care [name]: [name], [facility] Other Clinician: Referring [name]: Treating [name]/Extender: [name] SHO KES, [facility] Weeks in Treatment: 6 Vital Signs Height(in): 67 Pulse(bpm): 98 Weight(lbs): 153 Blood  Pressure(mmHg): 128/80 Body Mass Index(BMI): 24 Temperature(F): 97.6 Respiratory Rate(breaths/min): 20 [1:Photos: No Photos] [[id number] Page 3 of 13] Left, Dorsal T Great oe Left, Medial T Great oe Left, Medial T Third oe Wound Location: Footwear Injury Shear/Friction Shear/Friction Wounding Event: Abrasion Abrasion Abrasion Primary Etiology: Anemia, Angina, Hypertension, Anemia, Angina, Hypertension, Anemia, Angina, Hypertension, Comorbid History: Vasculitis, Osteoarthritis Vasculitis, Osteoarthritis Vasculitis, Osteoarthritis 05/24/2021 05/24/2021 05/24/2021 Date Acquired: 6 1 6  Weeks of Treatment: Open Open Open Wound Status: No No No Wound Recurrence: 0.6x0.7x0.1 0.7x0.8x0.1 1x0.5x0.1 Measurements L x W x D (cm) 0.33 0.44 0.393 A (cm) : rea 0.033 0.044 0.039 Volume (cm) : 25.00% 0.00% 28.50% % Reduction in A rea: 25.00% 0.00% 29.10% % Reduction in Volume: 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 Classification: Support Structures Support Structures Support Structures Medium Medium Medium Exudate A  mount: Serous Serosanguineous Serous Exudate Type: amber red, brown amber Exudate Color: Distinct, outline attached [name] Distinct, outline attached Wound Margin: Medium (34-66%) Medium (34-66%) Medium (34-66%) Granulation A mount: Pink, Pale Pink, Pale Pink, Pale Granulation Quality: Medium (34-66%) Medium (34-66%) Medium (34-66%) Necrotic A mount: Fat Layer (Subcutaneous Tissue): Yes Fat Layer (Subcutaneous Tissue): Yes Fat Layer (Subcutaneous Tissue): Yes Exposed Structures: Fascia: No Fascia: No Fascia: No Tendon: No Tendon: No Tendon: No Muscle: No Muscle: No Muscle: No Joint: No Joint: No Joint: No Bone: No Bone: No Bone: No None Small (1-33%) None Epithelialization: Debridement - Selective/Open Wound Debridement - Selective/Open Wound Debridement - Selective/Open Wound Debridement: Pre-procedure Verification/Time Out 14:09 14:09 14:09 Taken: Lidocaine 5% topical ointment Lidocaine 5% topical ointment Lidocaine 5% topical ointment Pain Control: [employer] Tissue Debrided: Non-Viable Tissue Non-Viable Tissue Non-Viable Tissue Level: 0.42 0.56 0.5 Debridement A (sq cm): rea Curette Curette Curette Instrument: Minimum Minimum Minimum Bleeding: Pressure Pressure Pressure Hemostasis A chieved: 0 0 0 Procedural Pain: 0 0 0 Post Procedural Pain: Procedure was tolerated well Procedure was tolerated well Procedure was tolerated well Debridement Treatment Response: 0.6x0.7x0.1 0.7x0.8x0.1 1x0.5x0.1 Post Debridement Measurements L x W x D (cm) 0.033 0.044 0.039 Post Debridement Volume: (cm) Scarring: Yes Scarring: Yes No Abnormalities Noted Periwound Skin Texture: Maceration: Yes Maceration: Yes Maceration: Yes Periwound Skin Moisture: Dry/Scaly: No Dry/Scaly: No Dry/Scaly: No Rubor: No No Abnormalities Noted Rubor: Yes Periwound Skin Color: No Abnormality No Abnormality No Abnormality Temperature: Debridement Debridement  Debridement Procedures Performed: Wound Number: 4 5 [name] Photos: [name] Left, Plantar Foot Left T Second oe [name] Wound Location: Skin Tear/Laceration Gradually Appeared [name] Wounding Event: Skin Tear Abrasion [name] Primary Etiology: Anemia, Angina, Hypertension, Anemia, Angina, Hypertension, [name] Comorbid History: Vasculitis, Osteoarthritis Vasculitis, Osteoarthritis 06/20/2022 07/13/2022 [name] Date Acquired: 5 1 [name] Weeks of Treatment: Open Open [name] Wound Status: No No [name] Wound Recurrence: 0.1x0.1x0.1 0.2x0.2x0.1 [name] Measurements L x W x D (cm) 0.008 0.031 [name] A (cm) : rea 0.001 0.003 [name] Volume (cm) : 74.20% 0.00% [name] % Reduction in Area: 66.70% 0.00% [name] % Reduction in Volume: 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 [name] Classification: Support Structures Support Structures None Present Medium [name] Exudate AmountShyonna Carlin, Eleana ([id number]) [id number] Page 4 of 13 [name] Serosanguineous [name] Exudate Type: [name] red, brown [name] Exudate Color: Distinct, outline attached [name] [name] Wound Margin: Medium (34-66%) Large (67-100%) [name] Granulation Amount: Pink, Pale Red [name] Granulation Quality: Medium (34-66%) Small (1-33%) [name] Necrotic Amount: Fat Layer (Subcutaneous Tissue): Yes Fat Layer (Subcutaneous Tissue): Yes [name] Exposed Structures: Fascia: No Tendon: No Muscle: No Joint: No Bone: No Large (67-100%)  None [name] Epithelialization: Debridement - Selective/Open Wound Debridement - Selective/Open Wound [name] Debridement: Pre-procedure Verification/Time Out 14:09 14:09 [name] Taken: Lidocaine 5% topical ointment Lidocaine 5% topical ointment [name] Pain Control: [facility] [name] Tissue Debrided: Non-Viable Tissue Non-Viable Tissue [name] Level: 0.01 0.04 [name] Debridement A (sq cm): rea Curette Curette [name] Instrument: Minimum Minimum [name] Bleeding: Pressure Pressure [name] Hemostasis A chieved: 0 0 [name] Procedural Pain: 0 0  [name] Post Procedural Pain: Procedure was tolerated well Procedure was tolerated well [name] Debridement Treatment Response: 0.1x0.1x0.1 0.2x0.2x0.1 [name] Post Debridement Measurements L x W x D (cm) 0.001 0.003 [name] Post Debridement Volume: (cm) Scarring: Yes Scarring: Yes [name] Periwound Skin Texture: Maceration: Yes Maceration: Yes [name] Periwound Skin Moisture: Dry/Scaly: No Rubor: Yes No Abnormalities Noted [name] Periwound Skin Color: No Abnormality No Abnormality [name] Temperature: Debridement Debridement [name] Procedures Performed: Treatment Notes Electronic Signature(s) Signed: 07/27/2022 2:27:36 PM By: Kathryn Guess MD FACS Entered By: [name] on 07/27/2022 14:27:36 -------------------------------------------------------------------------------- Multi-Disciplinary Care Plan Details Patient Name: Date of Service: [name], [facility] [name] 07/27/2022 1:30 PM Medical Record Number: [id number] Patient Account Number: [id number] Date of Birth/Sex: Treating RN: [birth year] (82 y.o. [name] Primary Care Cassidie Veiga: [name], [facility] Other Clinician: Referring Junnie Loschiavo: Treating Maryella Abood/Extender: [name], [facility] Weeks in Treatment: 6 Active Inactive Necrotic Tissue Nursing Diagnoses: Impaired tissue integrity related to necrotic/devitalized tissue Knowledge deficit related to management of necrotic/devitalized tissue Goals: Necrotic/devitalized tissue will be minimized in the wound bed Date Initiated: 06/12/2022 Target Resolution Date: 10/26/2022 Goal Status: Active Patient/caregiver will verbalize understanding of reason and process for debridement of necrotic tissue Date Initiated: 06/12/2022 Target Resolution Date: 10/26/2022 Goal Status: Active Interventions: Assess patient pain level pre-, during and post procedure and prior to discharge Provide education on necrotic tissue and debridement process Treatment Activities: Apply topical anesthetic as ordered :  06/12/2022 [name] ([id number]) [id number] Page 5 of 13 Notes: Wound/Skin Impairment Nursing Diagnoses: Impaired tissue integrity Knowledge deficit related to ulceration/compromised skin integrity Goals: Patient/caregiver will verbalize understanding of skin care regimen Date Initiated: 06/12/2022 Target Resolution Date: 10/26/2022 Goal Status: Active Interventions: Assess ulceration(s) every visit Treatment Activities: Skin care regimen initiated : 06/12/2022 Topical wound management initiated : 06/12/2022 Notes: Electronic Signature(s) Signed: 07/27/2022 5:11:14 PM By: Kathryn Schwalbe RN Entered By: [name] on 07/27/2022 15:49:17 -------------------------------------------------------------------------------- Pain Assessment Details Patient Name: Date of Service: [name] [facility] [name] 07/27/2022 1:30 PM Medical Record Number: [id number] Patient Account Number: [id number] Date of Birth/Sex: Treating RN: [birth year] (82 y.o. F) Primary Care Adiana Smelcer: [name], [facility] Other Clinician: Referring Shelsy Seng: Treating Corinna Burkman/Extender: [name], [facility] Weeks in Treatment: 6 Active Problems Location of Pain Severity and Description of Pain Patient Has Paino Yes Site Locations Rate the pain. Current Pain Level: 2 Pain Management and Medication Current Pain Management: Electronic Signature(s) Signed: 07/31/2022 12:48:39 PM By: Karl Ito Entered By: Karl Ito on 07/27/2022 13:31:07 Patient/Caregiver Education Details -------------------------------------------------------------------------------- [name] ([id number]) [id number] Page 6 of 13 Patient Name: Date of Service: Kathryn Eke 4/5/2024andnbsp1:30 PM Medical Record Number: [id number] Patient Account Number: [id number] Date of Birth/Gender: Treating RN: [birth year] Joseph (82 y.o. [name] Primary Care Physician: [name],  [facility] Other Clinician: Referring Physician: Treating Physician/Extender: [name], [facility] Weeks in Treatment: 6 Education Assessment Education Provided To: Patient Education Topics Provided Wound/Skin Impairment: Methods: Explain/Verbal Responses: Return demonstration correctly Electronic Signature(s) Signed: 07/27/2022 5:11:14 PM By: Kathryn Schwalbe RN Entered By: [name] on 07/27/2022 15:49:34 -------------------------------------------------------------------------------- Wound  Assessment Details Patient Name: Date of Service: [name], [name] 07/27/2022 1:30 PM Medical Record Number: [id number] Patient Account Number: [id number] Date of Birth/Sex: Treating RN: Jan [birth year], Joseph (82 y.o. F) Primary Care Janiesha Diehl: Kathryn [facility]O KES[facility] Other Clinician: Referring Michaelah Credeur: Treating Verbena Boeding/Extender: Kathryn Guessannon, Jennifer [facility] Weeks in Treatment: 6 Wound Status Wound Number: 1 Primary Etiology: Abrasion Wound Location: Left, Dorsal T Great oe Wound Status: Open Wounding Event: Footwear Injury Comorbid History: Anemia, Angina, Hypertension, Vasculitis, Osteoarthritis Date Acquired: 05/24/2021 Weeks Of Treatment: 6 Clustered Wound: No Wound Measurements Length: (cm) 0.6 Width: (cm) 0.7 Depth: (cm) 0.1 Area: (cm) 0.33 Volume: (cm) 0.033 % Reduction in Area: 25% % Reduction in Volume: 25% Epithelialization: None Tunneling: No Undermining: No Wound Description Classification: 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 Support Structures Wound Margin: Distinct, outline attached Exudate Amount: Medium Exudate Type: Serous Exudate Color: amber Foul Odor After Cleansing: No Slough/Fibrino Yes Wound Bed Granulation Amount: Medium (34-66%) Exposed Structure Granulation Quality: Pink, Pale Fascia Exposed: No Necrotic Amount: Medium (34-66%) Fat Layer (Subcutaneous Tissue) Exposed: Yes Necrotic Quality: Adherent Slough Tendon Exposed: No Muscle Exposed: No Joint  Exposed: No Bone Exposed: No Periwound Skin Texture Texture Color No Abnormalities Noted: No No Abnormalities Noted: No Scarring: Yes Rubor: No Moisture Temperature / Pain No Abnormalities Noted: No Temperature: No Abnormality Ridge, Iana ([id number]) [id number] Page 7 of 13 Dry / Scaly: No Maceration: Yes Treatment Notes Wound #1 (Toe Great) Wound Laterality: Dorsal, Left Cleanser Soap and Water Discharge Instruction: Kathryn shower and wash wound with dial antibacterial soap and water prior to dressing change. Wound Cleanser Discharge Instruction: Cleanse the wound with wound cleanser prior to applying a clean dressing using gauze sponges, not tissue or cotton balls. Peri-Wound Care Topical Primary Dressing Sorbalgon AG Dressing 2x2 (in/in) Discharge Instruction: Weave the Kathryn alginate between the left toes Secondary Dressing Woven Gauze Sponge, Non-Sterile 4x4 in Discharge Instruction: can use gauze between toes (if no desired) Secured With Compression Wrap Compression Stockings Add-Ons Electronic Signature(s) Signed: 07/27/2022 5:11:14 PM By: Kathryn SchwalbeScotton, Joanne RN Entered By: Kathryn SchwalbeScotton, Joanne on 07/27/2022 14:05:54 -------------------------------------------------------------------------------- Wound Assessment Details Patient Name: Date of Service: Kathryn HammondCA Joseph[name] 07/27/2022 1:30 PM Medical Record Number: [id number] Patient Account Number: [id number] Date of Birth/Sex: Treating RN: Jan [birth year], Joseph (82 y.o. F) Primary Care Calypso Hagarty: Kathryn PrimeSHO KES, [facility] Other Clinician: Referring Zafirah Vanzee: Treating Yadiel Aubry/Extender: Kathryn Guessannon, Jennifer SHO Dimas ChyleKES, [facility] Weeks in Treatment: 6 Wound Status Wound Number: 2 Primary Etiology: Abrasion Wound Location: Left, Medial T Great oe Wound Status: Open Wounding Event: Shear/Friction Comorbid History: Anemia, Angina, Hypertension, Vasculitis, Osteoarthritis Date Acquired: 05/24/2021 Weeks Of Treatment: 1 Clustered  Wound: No Photos Wound Measurements Length: (cm) 0.7 Width: (cm) 0.8 Depth: (cm) 0.1 [name], [name] ([id number]) Area: (cm) 0.44 Volume: (cm) 0.044 % Reduction in Area: 0% % Reduction in Volume: 0% Epithelialization: Small (1-33%) [id number] Page 8 of 13 Tunneling: No Undermining: No Wound Description Classification: 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 Support Structures Exudate Amount: Medium Exudate Type: Serosanguineous Exudate Color: red, brown Foul Odor After Cleansing: No Slough/Fibrino Yes Wound Bed Granulation Amount: Medium (34-66%) Exposed Structure Granulation Quality: Pink, Pale Fascia Exposed: No Necrotic Amount: Medium (34-66%) Fat Layer (Subcutaneous Tissue) Exposed: Yes Necrotic Quality: Adherent Slough Tendon Exposed: No Muscle Exposed: No Joint Exposed: No Bone Exposed: No Periwound Skin Texture Texture Color No Abnormalities Noted: No No Abnormalities Noted: Yes Scarring: Yes Temperature / Pain Temperature: No Abnormality Moisture No Abnormalities Noted: No Dry / Scaly: No Maceration: Yes Treatment Notes Wound #2 (Toe Great) Wound Laterality:  Left, Medial Cleanser Soap and Water Discharge Instruction: Kathryn shower and wash wound with dial antibacterial soap and water prior to dressing change. Wound Cleanser Discharge Instruction: Cleanse the wound with wound cleanser prior to applying a clean dressing using gauze sponges, not tissue or cotton balls. Peri-Wound Care Topical Primary Dressing Sorbalgon AG Dressing 2x2 (in/in) Discharge Instruction: Weave the Kathryn alginate between the left toes Secondary Dressing Woven Gauze Sponge, Non-Sterile 4x4 in Discharge Instruction: can use gauze between toes (if no desired) Secured With Compression Wrap Compression Stockings Add-Ons Electronic Signature(s) Signed: 07/27/2022 5:11:14 PM By: Kathryn Schwalbe RN Entered By: [name] on 07/27/2022  14:06:11 -------------------------------------------------------------------------------- Wound Assessment Details Patient Name: Date of Service: [name] [facility] [name] 07/27/2022 1:30 PM Medical Record Number: [id number] Patient Account Number: [id number] Date of Birth/Sex: Treating RN: Joseph[birth year] (82 y.o. F) Primary Care Gustava Berland: [name], [facility] Other Clinician: Referring Hawk Mones: Treating Linsey Arteaga/Extender: [name], [facility] Weeks in Treatment: 6 Wound Status Pavao, Georga ([id number]) [id number] Page 9 of 13 Wound Number: 3 Primary Etiology: Abrasion Wound Location: Left, Medial T Third oe Wound Status: Open Wounding Event: Shear/Friction Comorbid History: Anemia, Angina, Hypertension, Vasculitis, Osteoarthritis Date Acquired: 05/24/2021 Weeks Of Treatment: 6 Clustered Wound: No Photos Wound Measurements Length: (cm) 1 Width: (cm) 0.5 Depth: (cm) 0.1 Area: (cm) 0.393 Volume: (cm) 0.039 % Reduction in Area: 28.5% % Reduction in Volume: 29.1% Epithelialization: None Tunneling: No Undermining: No Wound Description Classification: 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 Support Structures Wound Margin: Distinct, outline attached Exudate Amount: Medium Exudate Type: Serous Exudate Color: amber Foul Odor After Cleansing: No Slough/Fibrino Yes Wound Bed Granulation Amount: Medium (34-66%) Exposed Structure Granulation Quality: Pink, Pale Fascia Exposed: No Necrotic Amount: Medium (34-66%) Fat Layer (Subcutaneous Tissue) Exposed: Yes Necrotic Quality: Adherent Slough Tendon Exposed: No Muscle Exposed: No Joint Exposed: No Bone Exposed: No Periwound Skin Texture Texture Color No Abnormalities Noted: Yes No Abnormalities Noted: Yes Moisture Temperature / Pain No Abnormalities Noted: No Temperature: No Abnormality Dry / Scaly: No Maceration: Yes Treatment Notes Wound #3 (Toe Third) Wound Laterality: Left, Medial Cleanser Soap and  Water Discharge Instruction: Kathryn shower and wash wound with dial antibacterial soap and water prior to dressing change. Wound Cleanser Discharge Instruction: Cleanse the wound with wound cleanser prior to applying a clean dressing using gauze sponges, not tissue or cotton balls. Peri-Wound Care Topical Primary Dressing Sorbalgon AG Dressing 2x2 (in/in) Discharge Instruction: Weave the Kathryn alginate between the left toes Secondary Dressing Woven Gauze Sponge, Non-Sterile 4x4 in Discharge Instruction: can use gauze between toes (if no desired) [name], [name] ([id number]) [id number] Page 10 of 13 Secured With Compression Wrap Compression Stockings Facilities manager) Signed: 07/27/2022 5:11:14 PM By: Kathryn Schwalbe RN Entered By: [name] on 07/27/2022 14:06:29 -------------------------------------------------------------------------------- Wound Assessment Details Patient Name: Date of Service: [name] [facility] [name] 07/27/2022 1:30 PM Medical Record Number: [id number] Patient Account Number: [id number] Date of Birth/Sex: Treating RN: Joseph[birth year] (82 y.o. F) Primary Care Ares Cardozo: [name], [facility] Other Clinician: Referring Thetis Schwimmer: Treating Nayzeth Altman/Extender: [name] SHO [name], [facility] Weeks in Treatment: 6 Wound Status Wound Number: 4 Primary Etiology: Skin Tear Wound Location: Left, Plantar Foot Wound Status: Open Wounding Event: Skin Tear/Laceration Comorbid History: Anemia, Angina, Hypertension, Vasculitis, Osteoarthritis Date Acquired: 06/20/2022 Weeks Of Treatment: 5 Clustered Wound: No Photos Wound Measurements Length: (cm) 0.1 Width: (cm) 0.1 Depth: (cm) 0.1 Area: (cm) 0.008 Volume: (cm) 0.001 % Reduction in Area: 74.2% % Reduction in Volume: 66.7% Epithelialization: Large (67-100%) Tunneling: No Undermining: No  Wound Description Classification: 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 Support Structures Wound Margin:  Distinct, outline attached Exudate Amount: None Present Foul Odor After Cleansing: No Slough/Fibrino No Wound Bed Granulation Amount: Medium (34-66%) Exposed Structure Granulation Quality: Pink, Pale Fascia Exposed: No Necrotic Amount: Medium (34-66%) Fat Layer (Subcutaneous Tissue) Exposed: Yes Necrotic Quality: Adherent Slough Tendon Exposed: No Muscle Exposed: No Joint Exposed: No Bone Exposed: No Periwound Skin Texture Texture Color No Abnormalities Noted: No No Abnormalities Noted: Yes Scarring: Yes Temperature / Pain Temperature: No Abnormality Moisture No Abnormalities Noted: No Dry / Scaly: No [name], [name] ([id number]) [id number] Page 11 of 13 Maceration: Yes Treatment Notes Wound #4 (Foot) Wound Laterality: Plantar, Left Cleanser Soap and Water Discharge Instruction: Kathryn shower and wash wound with dial antibacterial soap and water prior to dressing change. Wound Cleanser Discharge Instruction: Cleanse the wound with wound cleanser prior to applying a clean dressing using gauze sponges, not tissue or cotton balls. Peri-Wound Care Zinc Oxide Ointment 30g tube Discharge Instruction: Apply Zinc Oxide to periwound with each dressing change Topical Primary Dressing Sorbalgon AG Dressing 2x2 (in/in) Discharge Instruction: Weave the Kathryn alginate between the left toes Secondary Dressing Woven Gauze Sponge, Non-Sterile 4x4 in Discharge Instruction: can use gauze between toes (if no desired) Secured With Compression Wrap CoFlex Zinc Unna Boot, 4 x 6 (in/yd) Discharge Instruction: Left Lower leg Coflex Zinc Unna Boot or Use Zinc Unna boot Compression Stockings Add-Ons Electronic Signature(s) Signed: 07/27/2022 5:11:14 PM By: Kathryn Schwalbe RN Entered By: [name] on 07/27/2022 14:06:48 -------------------------------------------------------------------------------- Wound Assessment Details Patient Name: Date of Service: [name] [name] 07/27/2022 1:30 PM Medical Record Number: [id number] Patient Account Number: [id number] Date of Birth/Sex: Treating RN: [birth year] (82 y.o. F) Primary Care Jaslen Adcox: [name], [facility] Other Clinician: Referring Lanaya Bennis: Treating Robby Pirani/Extender: [name] [facility] K[facility], [facility] Weeks in Treatment: 6 Wound Status Wound Number: 5 Primary Etiology: Abrasion Wound Location: Left T Second oe Wound Status: Open Wounding Event: Gradually Appeared Comorbid History: Anemia, Angina, Hypertension, Vasculitis, Osteoarthritis Date Acquired: 07/13/2022 Weeks Of Treatment: 1 Clustered Wound: No Photos [name], [name] ([id number]) [id number] Page 12 of 13 Wound Measurements Length: (cm) 0.2 Width: (cm) 0.2 Depth: (cm) 0.1 Area: (cm) 0.031 Volume: (cm) 0.003 % Reduction in Area: 0% % Reduction in Volume: 0% Epithelialization: None Tunneling: No Undermining: No Wound Description Classification: 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 Support Structures Exudate Amount: Medium Exudate Type: Serosanguineous Exudate Color: red, brown Foul Odor After Cleansing: No Wound Bed Granulation Amount: Large (67-100%) Exposed Structure Granulation Quality: Red Fat Layer (Subcutaneous Tissue) Exposed: Yes Necrotic Amount: Small (1-33%) Necrotic Quality: Adherent Slough Periwound Skin Texture Texture Color No Abnormalities Noted: No No Abnormalities Noted: Yes Scarring: Yes Temperature / Pain Temperature: No Abnormality Moisture No Abnormalities Noted: No Maceration: Yes Treatment Notes Wound #5 (Toe Second) Wound Laterality: Left Cleanser Soap and Water Discharge Instruction: Kathryn shower and wash wound with dial antibacterial soap and water prior to dressing change. Wound Cleanser Discharge Instruction: Cleanse the wound with wound cleanser prior to applying a clean dressing using gauze sponges, not tissue or cotton balls. Peri-Wound Care Topical Primary  Dressing Sorbalgon AG Dressing 2x2 (in/in) Discharge Instruction: Weave the Kathryn alginate between the left toes Secondary Dressing Woven Gauze Sponge, Non-Sterile 4x4 in Discharge Instruction: can use gauze between toes (if no desired) Secured With Compression Wrap Compression Stockings Add-Ons Electronic Signature(s) Signed: 07/27/2022 5:11:14 PM By: Kathryn Schwalbe RN Entered By: [name] on 07/27/2022 14:07:09 -------------------------------------------------------------------------------- Vitals Details Patient Name: Date of Service: Kathryn  Rosanne Joseph [name] 07/27/2022 1:30 PM Medical Record Number: [id number] Patient Account Number: [id number] Date of Birth/Sex: Treating RN: [birth year] (82 y.o. F) Primary Care Payeton Germani: [name], [facility] Other Clinician: Referring Merary Garguilo: Treating Cyrene Gharibian/Extender: [name], [facility] Weeks in Treatment: 6 Vital Signs [name], [name] ([id number]) [id number] Page 13 of 13 Time Taken: 01:30 Temperature (F): 97.6 Height (in): 67 Pulse (bpm): 98 Weight (lbs): 153 Respiratory Rate (breaths/min): 20 Body Mass Index (BMI): 24 Blood Pressure (mmHg): 128/80 Reference Range: 80 - 120 mg / dl Electronic Signature(s) Signed: 07/31/2022 12:48:39 PM By: Karl Ito Entered By: Karl Ito on 07/27/2022 13:31:01

## 2022-08-08 ENCOUNTER — Encounter (HOSPITAL_BASED_OUTPATIENT_CLINIC_OR_DEPARTMENT_OTHER): Payer: Medicare Other | Admitting: General Surgery

## 2022-08-08 DIAGNOSIS — L97522 Non-pressure chronic ulcer of other part of left foot with fat layer exposed: Secondary | ICD-10-CM | POA: Diagnosis not present

## 2022-08-09 NOTE — Progress Notes (Signed)
[name] ([id number]) [id number] Page 1 of 12 Visit Report for 08/08/2022 Arrival Information Details Patient Name: Date of Service: [name] 08/08/2022 9:15 A M Medical Record Number: [id number] Patient Account Number: [id number] Date of Birth/Sex: Treating RN: 1940-11-30 (82 y.o. [name] Primary Care Sydni Elizarraraz: [name], [facility] Other Clinician: Referring Norman Piacentini: Treating Tarvaris Puglia/Extender: [name] SHO [name], [facility] Weeks in Treatment: 8 Visit Information History Since Last Visit All ordered tests and consults were completed: Yes Patient Arrived: Wheel Chair Added or deleted any medications: No Arrival Time: 09:33 Any new allergies or adverse reactions: No Accompanied By: self Had a fall or experienced change in No Transfer Assistance: None activities of daily living that may affect Patient Identification Verified: Yes risk of falls: Secondary Verification Process Completed: Yes Signs or symptoms of abuse/neglect since last visito No Patient Requires Transmission-Based Precautions: No Hospitalized since last visit: No Patient Has Alerts: No Implantable device outside of the clinic excluding No cellular tissue based products placed in the center since last visit: Has Dressing in Place as Prescribed: Yes Pain Present Now: No Electronic Signature(s) Signed: 08/09/2022 1:56:54 PM By: Brenton Grills Entered By: Brenton Grills on 08/08/2022 09:34:42 -------------------------------------------------------------------------------- Encounter Discharge Information Details Patient Name: Date of Service: [name], [facility] [name] 08/08/2022 9:15 A M Medical Record Number: [id number] Patient Account Number: [id number] Date of Birth/Sex: Treating RN: 1940-12-23 (82 y.o. [name] Primary Care Pablo Mathurin: [name], [facility] Other Clinician: Referring Truitt Cruey: Treating Naomie Crow/Extender: [name] SHO [name], [facility] Weeks in  Treatment: 8 Encounter Discharge Information Items Post Procedure Vitals Discharge Condition: Stable Temperature (F): 97.9 Ambulatory Status: Wheelchair Pulse (bpm): 97 Discharge Destination: Home Respiratory Rate (breaths/min): 20 Transportation: Private Auto Blood Pressure (mmHg): 121/77 Accompanied By: caregiver Schedule Follow-up Appointment: Yes Clinical Summary of Care: Patient Declined Electronic Signature(s) Signed: 08/08/2022 3:19:30 PM By: [name] Entered By: [name] on 08/08/2022 11:41:59 -------------------------------------------------------------------------------- Lower Extremity Assessment Details Patient Name: Date of Service: [name] 08/08/2022 9:15 A M Medical Record Number: [id number] Patient Account Number: [id number] Date of Birth/Sex: Treating RN: [birth year] (82 y.o. [name] Primary Care Zyionna Pesce: [name], [facility] Other Clinician: Referring Jahmai Finelli: Treating Tempestt Silba/Extender: [name] SHO KES, [facility] Weeks in Treatment: 8 Edema Assessment C[LeftRIGBY, SWAMY ([id number])] Franne Forts: [id number] Page 2 of 12] Assessed: [Left: Yes] [Right: Yes] Edema: [Left: No] [Right: No] Calf Left: Right: Point of Measurement: From Medial Instep 33.6 cm 29 cm Ankle Left: Right: Point of Measurement: From Medial Instep 25.2 cm 21 cm Vascular Assessment Pulses: Dorsalis Pedis Palpable: [Left:Yes] [Right:Yes] Electronic Signature(s) Signed: 08/09/2022 1:56:54 PM By: Brenton Grills Entered By: Brenton Grills on 08/08/2022 09:47:22 -------------------------------------------------------------------------------- Multi Wound Chart Details Patient Name: Date of Service: [name], [facility] [name] 08/08/2022 9:15 A M Medical Record Number: [id number] Patient Account Number: [id number] Date of Birth/Sex: Treating RN: [birth year] (82 y.o. F) Primary Care Loghan Kurtzman: [name], [facility] Other Clinician: Referring  Kalea Perine: Treating Paul Trettin/Extender: [name], [facility] Weeks in Treatment: 8 Vital Signs Height(in): 67 Pulse(bpm): 97 Weight(lbs): 153 Blood Pressure(mmHg): 121/77 Body Mass Index(BMI): 24 Temperature(F): 97.9 Respiratory Rate(breaths/min): 20 [1:Photos:] [3:No Photos] Left, Dorsal T Great oe Left, Medial T Great oe Left, Medial T Third oe Wound Location: Footwear Injury Shear/Friction Shear/Friction Wounding Event: Abrasion Abrasion Abrasion Primary Etiology: Anemia, Angina, Hypertension, Anemia, Angina, Hypertension, Anemia, Angina, Hypertension, Comorbid History: Vasculitis, Osteoarthritis Vasculitis, Osteoarthritis Vasculitis, Osteoarthritis 05/24/2021 05/24/2021 05/24/2021 Date Acquired: 8 2 8  Weeks of Treatment: Open Open Healed - Epithelialized  Wound Status: No No No Wound Recurrence: 0.3x0.4x0.1 0.5x0.5x0.1 0x0x0 Measurements L x W x D (cm) 0.094 0.196 0 A (cm) : rea 0.009 0.02 0 Volume (cm) : 78.60% 55.50% 100.00% % Reduction in Area: 79.50% 54.50% 100.00% % Reduction in Volume: 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 Classification: Support Structures Support Structures Support Structures Medium Medium None Present Exudate Amount: Serous Serosanguineous [name] Exudate Type: amber red, brown [name] Exudate Color: Distinct, outline attached [name] Distinct, outline attached Wound Margin: Medium (34-66%) Medium (34-66%) None Present (0%) Granulation Amount: Pink, Pale Pink, Pale [name] Granulation Quality: Medium (34-66%) Medium (34-66%) Medium (34-66%) Necrotic Amount: Fat Layer (Subcutaneous Tissue): Yes Fat Layer (Subcutaneous Tissue): Yes Fat Layer (Subcutaneous Tissue): Yes Exposed Structures: [name], [name] ([id number]) [id number] Page 3 of 12 Fascia: No Fascia: No Fascia: No Tendon: No Tendon: No Tendon: No Muscle: No Muscle: No Muscle: No Joint:  No Joint: No Joint: No Bone: No Bone: No Bone: No None Small (1-33%) None Epithelialization: [name] Debridement - Selective/Open Wound [name] Debridement: Pre-procedure Verification/Time Out [name] 10:24 [name] Taken: [name] Lidocaine 4% Topical Solution [name] Pain Control: [name] Slough [name] Tissue Debrided: [name] Non-Viable Tissue [name] Level: [name] 0.25 [name] Debridement A (sq cm): rea [name] Curette [name] Instrument: [name] Minimum [name] Bleeding: [name] Pressure [name] Hemostasis A chieved: [name] Procedure was tolerated well [name] Debridement Treatment Response: [name] 0.5x0.5x0.1 [name] Post Debridement Measurements L x W x D (cm) [name] 0.02 [name] Post Debridement Volume: (cm) Scarring: Yes Scarring: Yes No Abnormalities Noted Periwound Skin Texture: Maceration: Yes Maceration: Yes Maceration: No Periwound Skin Moisture: Dry/Scaly: No Dry/Scaly: No Dry/Scaly: No Rubor: No No Abnormalities Noted Rubor: Yes Periwound Skin Color: No Abnormality No Abnormality No Abnormality Temperature: [name] Debridement [name] Procedures Performed: Wound Number: 4 5 [name] Photos: [name] Left, Plantar Foot Left T Second oe [name] Wound Location: Skin T ear/Laceration Gradually Appeared [name] Wounding Event: Skin T ear Abrasion [name] Primary Etiology: Anemia, Angina, Hypertension, Anemia, Angina, Hypertension, [name] Comorbid History: Vasculitis, Osteoarthritis Vasculitis, Osteoarthritis 06/20/2022 07/13/2022 [name] Date A cquired: 7 2 [name] Weeks of Treatment: Open Open [name] Wound Status: No No [name] Wound Recurrence: 0.1x0.1x0.1 0.1x0.1x0.1 [name] Measurements L x W x D (cm) 0.008 0.008 [name] A (cm) : rea 0.001 0.001 [name] Volume (cm) : 74.20% 74.20% [name] % Reduction in A rea: 66.70% 66.70% [name] % Reduction in Volume: 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 [name] Classification: Support Structures Support Structures None Present Medium [name] Exudate A mount: [name] Serosanguineous [name] Exudate Type: [name] red, brown  [name] Exudate Color: Distinct, outline attached [name] [name] Wound Margin: Medium (34-66%) Large (67-100%) [name] Granulation A mount: Pink, Pale Red [name] Granulation Quality: Medium (34-66%) Small (1-33%) [name] Necrotic A mount: Fat Layer (Subcutaneous Tissue): Yes Fat Layer (Subcutaneous Tissue): Yes [name] Exposed Structures: Fascia: No Tendon: No Muscle: No Joint: No Bone: No Large (67-100%) None [name] Epithelialization: [name] [name] [name] Debridement: [name] [name] [name] Pain Control: [name] [name] [name] Tissue Debrided: [name] [name] [name] Level: [name] [name] [name] Debridement A (sq cm): rea [name] [name] [name] Instrument: [name] [name] [name] Bleeding: [name] [name] [name] Hemostasis A chieved: Debridement Treatment Response: [name] [name] [name] Post Debridement Measurements L x [name] [name] [name] W x D (cm) [name] [name] [name] Post Debridement Volume: (cm) Scarring: Yes Scarring: Yes [name] Periwound Skin Texture: Maceration: Yes Maceration: Yes [name] Periwound Skin Moisture: Dry/Scaly: No Rubor: Yes No Abnormalities Noted [name] Periwound Skin Color: No Abnormality No Abnormality [name] Temperature: [name] [name] [name] Procedures  Performed: MARVELOUS, WOOLFORD ([id number]) [id number] Page 4 of 12 Treatment Notes Electronic Signature(s) Signed: 08/08/2022 10:58:33 AM By: [name] MD FACS Entered By: [name] on 08/08/2022 10:58:33 -------------------------------------------------------------------------------- Multi-Disciplinary Care Plan Details Patient Name: Date of Service: [name], [facility] [name] 08/08/2022 9:15 A M Medical Record Number: [id number] Patient Account Number: [id number] Date of Birth/Sex: Treating RN: March 26, 1941 (82 y.o. [name] Primary Care Kelli Robeck: [name], [facility] Other Clinician: Referring Korrina Zern: Treating Eulonda Andalon/Extender: [name] SHO [name], [facility] Weeks in Treatment: 8 Active Inactive Necrotic Tissue Nursing Diagnoses: Impaired tissue integrity related to necrotic/devitalized  tissue Knowledge deficit related to management of necrotic/devitalized tissue Goals: Necrotic/devitalized tissue will be minimized in the wound bed Date Initiated: 06/12/2022 Target Resolution Date: 10/26/2022 Goal Status: Active Patient/caregiver will verbalize understanding of reason and process for debridement of necrotic tissue Date Initiated: 06/12/2022 Target Resolution Date: 10/26/2022 Goal Status: Active Interventions: Assess patient pain level pre-, during and post procedure and prior to discharge Provide education on necrotic tissue and debridement process Treatment Activities: Apply topical anesthetic as ordered : 06/12/2022 Notes: Wound/Skin Impairment Nursing Diagnoses: Impaired tissue integrity Knowledge deficit related to ulceration/compromised skin integrity Goals: Patient/caregiver will verbalize understanding of skin care regimen Date Initiated: 06/12/2022 Target Resolution Date: 10/26/2022 Goal Status: Active Interventions: Assess ulceration(s) every visit Treatment Activities: Skin care regimen initiated : 06/12/2022 Topical wound management initiated : 06/12/2022 Notes: Electronic Signature(s) Signed: 08/09/2022 1:56:54 PM By: Brenton Grills Entered By: Brenton Grills on 08/08/2022 10:07:18 Yakubov, [name] ([id number]) [id number] Page 5 of 12 -------------------------------------------------------------------------------- Pain Assessment Details Patient Name: Date of Service: [name] 08/08/2022 9:15 A M Medical Record Number: [id number] Patient Account Number: [id number] Date of Birth/Sex: Treating RN: [birth year] (82 y.o. [name] Primary Care Leilanny Fluitt: [name], [facility] Other Clinician: Referring Juno Bozard: Treating Indiah Heyden/Extender: [name] SHO [name], [facility] Weeks in Treatment: 8 Active Problems Location of Pain Severity and Description of Pain Patient Has Paino No Site Locations Pain Management and  Medication Current Pain Management: Electronic Signature(s) Signed: 08/09/2022 1:56:54 PM By: Brenton Grills Entered By: Brenton Grills on 08/08/2022 09:43:48 -------------------------------------------------------------------------------- Patient/Caregiver Education Details Patient Name: Date of Service: [name], [facility] [name] 4/17/2024andnbsp9:15 A M Medical Record Number: [id number] Patient Account Number: [id number] Date of Birth/Gender: Treating RN: [birth year] (82 y.o. [name] Primary Care Physician: [name], [facility] Other Clinician: Referring Physician: Treating Physician/Extender: [name], [facility] Weeks in Treatment: 8 Education Assessment Education Provided To: Patient and Caregiver Education Topics Provided Wound/Skin Impairment: Methods: Explain/Verbal Responses: State content correctly Electronic Signature(s) Signed: 08/09/2022 1:56:54 PM By: Brenton Grills Entered By: Brenton Grills on 08/08/2022 10:07:47 Wound Assessment Details -------------------------------------------------------------------------------- [name] ([id number]) [id number].pdf Page 6 of 12 Patient Name: Date of Service: [name] 08/08/2022 9:15 A M Medical Record Number: [id number] Patient Account Number: [id number] Date of Birth/Sex: Treating RN: Mar 27, 1941 (82 y.o. [name] Primary Care Rae Plotner: [name], [facility] Other Clinician: Referring Ameya Kutz: Treating Leanore Biggers/Extender: [name] SHO KES, [facility] Weeks in Treatment: 8 Wound Status Wound Number: 1 Primary Etiology: Abrasion Wound Location: Left, Dorsal T Great oe Wound Status: Open Wounding Event: Footwear Injury Comorbid History: Anemia, Angina, Hypertension, Vasculitis, Osteoarthritis Date Acquired: 05/24/2021 Weeks Of Treatment: 8 Clustered Wound: No Photos Wound Measurements Length: (cm) 0.3 % Reduction in Area: 78.6% Width: (cm) 0.4 % Reduction in Volume:  79.5% Depth: (cm) 0.1 Epithelialization: None Area: (cm) 0.094 Volume: (cm) 0.009 Wound Description Classification: 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 Support Structures Foul Odor After  Cleansing: No Wound Margin: Distinct, outline attached Slough/Fibrino Yes Exudate Amount: Medium Exudate Type: Serous Exudate Color: amber Wound Bed Granulation Amount: Medium (34-66%) Exposed Structure Granulation Quality: Pink, Pale Fascia Exposed: No Necrotic Amount: Medium (34-66%) Fat Layer (Subcutaneous Tissue) Exposed: Yes Necrotic Quality: Adherent Slough Tendon Exposed: No Muscle Exposed: No Joint Exposed: No Bone Exposed: No Periwound Skin Texture Texture Color No Abnormalities Noted: No No Abnormalities Noted: No Scarring: Yes Rubor: No Moisture Temperature / Pain No Abnormalities Noted: No Temperature: No Abnormality Dry / Scaly: No Maceration: Yes Treatment Notes Wound #1 (Toe Great) Wound Laterality: Dorsal, Left Cleanser Soap and Water Discharge Instruction: May shower and wash wound with dial antibacterial soap and water prior to dressing change. Wound Cleanser Discharge Instruction: Cleanse the wound with wound cleanser prior to applying a clean dressing using gauze sponges, not tissue or cotton balls. Peri-Wound Care Sanger, Khala ([id number]) [id number] Page 7 of 12 Topical Primary Dressing Sorbalgon AG Dressing 2x2 (in/in) Discharge Instruction: Weave the silver alginate between the left toes Secondary Dressing Woven Gauze Sponge, Non-Sterile 4x4 in Discharge Instruction: can use gauze between toes (if no desired) Secured With Compression Wrap Compression Stockings Add-Ons Electronic Signature(s) Signed: 08/09/2022 1:56:54 PM By: Brenton Grills Entered By: Brenton Grills on 08/08/2022 10:02:37 -------------------------------------------------------------------------------- Wound Assessment Details Patient Name: Date of  Service: [name] [facility] [name] 08/08/2022 9:15 A M Medical Record Number: [id number] Patient Account Number: [id number] Date of Birth/Sex: Treating RN: May 13, 1940 (82 y.o. [name] Primary Care Deriona Altemose: [name], [facility] Other Clinician: Referring Duriel Deery: Treating Latanza Pfefferkorn/Extender: [name] SHO [name], [facility] Weeks in Treatment: 8 Wound Status Wound Number: 2 Primary Etiology: Abrasion Wound Location: Left, Medial T Great oe Wound Status: Open Wounding Event: Shear/Friction Comorbid History: Anemia, Angina, Hypertension, Vasculitis, Osteoarthritis Date Acquired: 05/24/2021 Weeks Of Treatment: 2 Clustered Wound: No Photos Wound Measurements Length: (cm) 0.5 Width: (cm) 0.5 Depth: (cm) 0.1 Area: (cm) 0.196 Volume: (cm) 0.02 % Reduction in Area: 55.5% % Reduction in Volume: 54.5% Epithelialization: Small (1-33%) Tunneling: No Undermining: No Wound Description Classification: 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 Support Structures Exudate Amount: Medium Exudate Type: Serosanguineous Exudate Color: red, brown Foul Odor After Cleansing: No Slough/Fibrino Yes Wound Bed Granulation Amount: Medium (34-66%) Exposed Structure Granulation Quality: Pink, Pale Fascia Exposed: No Necrotic Amount: Medium (34-66%) Fat Layer (Subcutaneous Tissue) Exposed: Yes Necrotic Quality: Adherent Slough Tendon Exposed: No Muscle Exposed: No [name], [name] ([id number]) [phone].pdf Page 8 of 12 Joint Exposed: No Bone Exposed: No Periwound Skin Texture Texture Color No Abnormalities Noted: No No Abnormalities Noted: Yes Scarring: Yes Temperature / Pain Temperature: No Abnormality Moisture No Abnormalities Noted: No Dry / Scaly: No Maceration: Yes Treatment Notes Wound #2 (Toe Great) Wound Laterality: Left, Medial Cleanser Soap and Water Discharge Instruction: May shower and wash wound with dial antibacterial soap and water prior to dressing  change. Wound Cleanser Discharge Instruction: Cleanse the wound with wound cleanser prior to applying a clean dressing using gauze sponges, not tissue or cotton balls. Peri-Wound Care Topical Primary Dressing Sorbalgon AG Dressing 2x2 (in/in) Discharge Instruction: Weave the silver alginate between the left toes Secondary Dressing Woven Gauze Sponge, Non-Sterile 4x4 in Discharge Instruction: can use gauze between toes (if no desired) Secured With Compression Wrap Compression Stockings Add-Ons Electronic Signature(s) Signed: 08/09/2022 1:56:54 PM By: Brenton Grills Entered By: Brenton Grills on 08/08/2022 10:03:23 -------------------------------------------------------------------------------- Wound Assessment Details Patient Name: Date of Service: [name] [facility] [name] 08/08/2022 9:15 A M Medical Record Number: [id number] Patient Account Number: [id number] Date  of Birth/Sex: Treating RN: 05-08-1940 (81 y.o. [name] Primary Care Meagen Limones: [name], [facility] Other Clinician: Referring Eliora Nienhuis: Treating Janeisha Ryle/Extender: [name] SHO [name], [facility] Weeks in Treatment: 8 Wound Status Wound Number: 3 Primary Etiology: Abrasion Wound Location: Left, Medial T Third oe Wound Status: Healed - Epithelialized Wounding Event: Shear/Friction Comorbid History: Anemia, Angina, Hypertension, Vasculitis, Osteoarthritis Date Acquired: 05/24/2021 Weeks Of Treatment: 8 Clustered Wound: No Wound Measurements Length: (cm) Width: (cm) Depth: (cm) Area: (cm) Volume: (cm) 0 % Reduction in Area: 100% 0 % Reduction in Volume: 100% 0 Epithelialization: None 0 Tunneling: No 0 Undermining: No Wound Description Kilmartin, Levy ([id number]) Classification: 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 Support Structures Wound Margin: Distinct, outline attached Exudate Amount: None Present [phone].pdf Page 9 of 12 Foul Odor After Cleansing: No Slough/Fibrino No Wound  Bed Granulation Amount: None Present (0%) Exposed Structure Necrotic Amount: Medium (34-66%) Fascia Exposed: No Fat Layer (Subcutaneous Tissue) Exposed: Yes Tendon Exposed: No Muscle Exposed: No Joint Exposed: No Bone Exposed: No Periwound Skin Texture Texture Color No Abnormalities Noted: Yes No Abnormalities Noted: Yes Moisture Temperature / Pain No Abnormalities Noted: No Temperature: No Abnormality Dry / Scaly: No Maceration: No Treatment Notes Wound #3 (Toe Third) Wound Laterality: Left, Medial Cleanser Peri-Wound Care Topical Primary Dressing Secondary Dressing Secured With Compression Wrap Compression Stockings Add-Ons Electronic Signature(s) Signed: 08/09/2022 1:56:54 PM By: Brenton Grills Entered By: Brenton Grills on 08/08/2022 10:28:20 -------------------------------------------------------------------------------- Wound Assessment Details Patient Name: Date of Service: [name] [facility] [name] 08/08/2022 9:15 A M Medical Record Number: [id number] Patient Account Number: [id number] Date of Birth/Sex: Treating RN: 1940/05/14 (82 y.o. [name] Primary Care Renalda Locklin: [name], [facility] Other Clinician: Referring Mannie Wineland: Treating Yee Gangi/Extender: [name] SHO KES, [facility] Weeks in Treatment: 8 Wound Status Wound Number: 4 Primary Etiology: Skin Tear Wound Location: Left, Plantar Foot Wound Status: Open Wounding Event: Skin Tear/Laceration Comorbid History: Anemia, Angina, Hypertension, Vasculitis, Osteoarthritis Date Acquired: 06/20/2022 Weeks Of Treatment: 7 Clustered Wound: No Photos [name], [name] ([id number]) [phone].pdf Page 10 of 12 Wound Measurements Length: (cm) 0.1 Width: (cm) 0.1 Depth: (cm) 0.1 Area: (cm) 0.008 Volume: (cm) 0.001 % Reduction in Area: 74.2% % Reduction in Volume: 66.7% Epithelialization: Large (67-100%) Tunneling: No Undermining: No Wound Description Classification: Full Thickness  Without Exposed Support Structures Wound Margin: Distinct, outline attached Exudate Amount: None Present Foul Odor After Cleansing: No Slough/Fibrino No Wound Bed Granulation Amount: Medium (34-66%) Exposed Structure Granulation Quality: Pink, Pale Fascia Exposed: No Necrotic Amount: Medium (34-66%) Fat Layer (Subcutaneous Tissue) Exposed: Yes Necrotic Quality: Adherent Slough Tendon Exposed: No Muscle Exposed: No Joint Exposed: No Bone Exposed: No Periwound Skin Texture Texture Color No Abnormalities Noted: No No Abnormalities Noted: Yes Scarring: Yes Temperature / Pain Temperature: No Abnormality Moisture No Abnormalities Noted: No Dry / Scaly: No Maceration: Yes Treatment Notes Wound #4 (Foot) Wound Laterality: Plantar, Left Cleanser Soap and Water Discharge Instruction: May shower and wash wound with dial antibacterial soap and water prior to dressing change. Wound Cleanser Discharge Instruction: Cleanse the wound with wound cleanser prior to applying a clean dressing using gauze sponges, not tissue or cotton balls. Peri-Wound Care Zinc Oxide Ointment 30g tube Discharge Instruction: Apply Zinc Oxide to periwound with each dressing change Topical Primary Dressing Sorbalgon AG Dressing 2x2 (in/in) Discharge Instruction: Weave the silver alginate between the left toes Secondary Dressing Woven Gauze Sponge, Non-Sterile 4x4 in Discharge Instruction: can use gauze between toes (if no desired) Secured With Compression Wrap CoFlex Zinc Unna Boot, 4 x 6 (  in/yd) Discharge Instruction: Left Lower leg Coflex [employer] or Use Zinc [employer] [place], [place] ([id number]) [id number] Page 11 of 12 Compression Stockings Add-Ons Electronic Signature(s) Signed: 08/09/2022 1:56:54 PM By: Brenton Grills Entered By: Brenton Grills on 08/08/2022 10:05:19 -------------------------------------------------------------------------------- Wound Assessment  Details Patient Name: Date of Service: [name] [facility] [name] 08/08/2022 9:15 A M Medical Record Number: [id number] Patient Account Number: [id number] Date of Birth/Sex: Treating RN: 1941-01-15 (82 y.o. [name] Primary Care Reginold Beale: [name], [facility] Other Clinician: Referring Aden Youngman: Treating Makeila Yamaguchi/Extender: [name] SHO [name], [facility] Weeks in Treatment: 8 Wound Status Wound Number: 5 Primary Etiology: Abrasion Wound Location: Left T Second oe Wound Status: Open Wounding Event: Gradually Appeared Comorbid History: Anemia, Angina, Hypertension, Vasculitis, Osteoarthritis Date Acquired: 07/13/2022 Weeks Of Treatment: 2 Clustered Wound: No Photos Wound Measurements Length: (cm) 0.1 Width: (cm) 0.1 Depth: (cm) 0.1 Area: (cm) 0.008 Volume: (cm) 0.001 % Reduction in Area: 74.2% % Reduction in Volume: 66.7% Epithelialization: None Tunneling: No Undermining: No Wound Description Classification: 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 Support Structures Exudate Amount: Medium Exudate Type: Serosanguineous Exudate Color: red, brown Foul Odor After Cleansing: No Wound Bed Granulation Amount: Large (67-100%) Exposed Structure Granulation Quality: Red Fat Layer (Subcutaneous Tissue) Exposed: Yes Necrotic Amount: Small (1-33%) Necrotic Quality: Adherent Slough Periwound Skin Texture Texture Color No Abnormalities Noted: No No Abnormalities Noted: Yes Scarring: Yes Temperature / Pain Temperature: No Abnormality Moisture No Abnormalities Noted: No Maceration: Yes Treatment Notes Wound #5 (Toe Second) Wound Laterality: Left [name], [name] ([id number]) [id number] Page 12 of 12 Cleanser Soap and Water Discharge Instruction: May shower and wash wound with dial antibacterial soap and water prior to dressing change. Wound Cleanser Discharge Instruction: Cleanse the wound with wound cleanser prior to applying a clean dressing using gauze sponges,  not tissue or cotton balls. Peri-Wound Care Topical Primary Dressing Sorbalgon AG Dressing 2x2 (in/in) Discharge Instruction: Weave the silver alginate between the left toes Secondary Dressing Woven Gauze Sponge, Non-Sterile 4x4 in Discharge Instruction: can use gauze between toes (if no desired) Secured With Compression Wrap Compression Stockings Add-Ons Electronic Signature(s) Signed: 08/09/2022 1:56:54 PM By: Brenton Grills Entered By: Brenton Grills on 08/08/2022 10:06:16 -------------------------------------------------------------------------------- Vitals Details Patient Name: Date of Service: [name], [facility] [name] 08/08/2022 9:15 A M Medical Record Number: [id number] Patient Account Number: [id number] Date of Birth/Sex: Treating RN: 09/06/1940 (82 y.o. [name] Primary Care Kemani Heidel: [name], [facility] Other Clinician: Referring Laquashia Mergenthaler: Treating Frazer [place]/Extender: [name] SHO [name], [facility] Weeks in Treatment: 8 Vital Signs Time Taken: 09:34 Temperature (F): 97.9 Height (in): 67 Pulse (bpm): 97 Weight (lbs): 153 Respiratory Rate (breaths/min): 20 Body Mass Index (BMI): 24 Blood Pressure (mmHg): 121/77 Reference Range: 80 - 120 mg / dl Electronic Signature(s) Signed: 08/09/2022 1:56:54 PM By: Brenton Grills Entered By: Brenton Grills on 08/08/2022 09:43:40

## 2022-08-09 NOTE — Progress Notes (Signed)
[name], [name] ([id number]) [id number] Page 1 of 9 Visit Report for 08/08/2022 Chief Complaint Document Details Patient Name: Date of Service: [name] RO N 08/08/2022 9:15 A M Medical Record Number: [id number] Patient Account Number: [id number] Date of Birth/Sex: Treating RN: 1940/11/18 (82 y.o. F) Primary Care Provider: [name], [facility] Other Clinician: Referring Provider: Treating Provider/Extender: [name], [facility] Weeks in Treatment: 8 Information Obtained from: Patient Chief Complaint Patient seen for complaints of Non-Healing Wound. Electronic Signature(s) Signed: 08/08/2022 10:58:42 AM By: [name] MD FACS Entered By: [name] on 08/08/2022 10:58:42 -------------------------------------------------------------------------------- Debridement Details Patient Name: Date of Service: [name], [facility] RO N 08/08/2022 9:15 A M Medical Record Number: [id number] Patient Account Number: [id number] Date of Birth/Sex: Treating RN: 03/02/1941 (82 y.o. [name] Primary Care Provider: [name], [facility] Other Clinician: Referring Provider: Treating Provider/Extender: [name], [facility] Weeks in Treatment: 8 Debridement Performed for Assessment: Wound #2 Left,Medial T Great oe Performed By: Physician [name], MD Debridement Type: Debridement Level of Consciousness (Pre-procedure): Awake and Alert Pre-procedure Verification/Time Out Yes - 10:24 Taken: Start Time: 10:24 Pain Control: Lidocaine 4% T opical Solution T Area Debrided (L x W): otal 0.5 (cm) x 0.5 (cm) = 0.25 (cm) Tissue and other material debrided: Slough, Slough Level: Non-Viable Tissue Debridement Description: Selective/Open Wound Instrument: Curette Bleeding: Minimum Hemostasis Achieved: Pressure End Time: 10:25 Response to Treatment: Procedure was tolerated well Level of Consciousness (Post- Awake and Alert procedure): Post  Debridement Measurements of Total Wound Length: (cm) 0.5 Width: (cm) 0.5 Depth: (cm) 0.1 Volume: (cm) 0.02 Character of Wound/Ulcer Post Debridement: Improved Post Procedure Diagnosis Same as Pre-procedure Notes Scribed for Dr. Lady Gary by Brenton Grills RN. Electronic Signature(s) Signed: 08/08/2022 1:04:04 PM By: [name] MD FACS Signed: 08/09/2022 1:56:54 PM By: Brenton Grills Entered By: Brenton Grills on 08/08/2022 10:26:44 Marsh Dolly ([id number]) [id number] Page 2 of 9 -------------------------------------------------------------------------------- HPI Details Patient Name: Date of Service: [name] 08/08/2022 9:15 A M Medical Record Number: [id number] Patient Account Number: [id number] Date of Birth/Sex: Treating RN: [birth year] (82 y.o. F) Primary Care Provider: [name], [facility] Other Clinician: Referring Provider: Treating Provider/Extender: [name], [facility] Weeks in Treatment: 8 History of Present Illness HPI Description: ADMISSION 06/12/2022 This is an 82 year old woman with a history of CVA, crest syndrome, atrial fibrillation, rocker-bottom foot deformity. 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 She has been followed by podiatry for this and I am not entirely clear as to how she came to be referred here. She resides in an assisted living facility. It is not clear what they have been putting on her wound, but on intake, she was noted to have denuded skin on her medial third toe, as well as ulcers on her dorsal great toe and lateral great toe. There is slough accumulation on both of the toe ulcers. There was an odor noted at intake, but after her foot was washed, the odor dissipated. Her toes are folded on top of each other creating areas of abrasion and pockets for moisture collection, which seems to be the primary cause of her ulceration. 06/20/2022: The skin  between her toes and on the ball of her foot is completely macerated. There has been more tissue breakdown. The wound on her great toe has some slough accumulation. 06/27/2022: No change to her wounds today. There has been no further deterioration, but no significant improvement. She was both hypotensive and  bradycardic on intake. 07/11/2022: Today, her foot is completely macerated. She reports that the wound care nurse actually soaked her foot and then applied foam, despite our specific orders to not use foam at all. She also is draining serous fluid from both legs and has 2+ pitting edema. She is on furosemide 20 mg twice a day. 07/19/2022: Once again, her foot is completely macerated. There has been further tissue breakdown to the second and third toes and she now has ulcers on the distal ends. The wounds on her medial and lateral great toe have thick slough accumulation. Apparently Xeroform was found between her toes on intake. Edema control is improved, but still not perfect. No overt drainage from her legs appreciated on exam today. 07/27/2022: She has less tissue maceration today. Edema control in her bilateral lower extremities is improved. Still with slough accumulation on all open wound surfaces. 08/08/2022: Significant improvement this week. She has very little tissue maceration and according to her aide, there has been very little drainage from her legs. There is some slough accumulation on the medial great toe ulcer. Edema control is improved bilaterally. She is spending more time in her bed with her legs elevated and less in her wheelchair with her legs in a dependent position. Electronic Signature(s) Signed: 08/08/2022 10:59:59 AM By: [name] MD FACS Entered By: [name] on 08/08/2022 10:59:59 -------------------------------------------------------------------------------- Physical Exam Details Patient Name: Date of Service: [name] [facility] RO N 08/08/2022 9:15 A M Medical  Record Number: [id number] Patient Account Number: [id number] Date of Birth/Sex: Treating RN: [birth year] (82 y.o. F) Primary Care Provider: [name], [facility] Other Clinician: Referring Provider: Treating Provider/Extender: [name] SHO KES, [facility] Weeks in Treatment: 8 Constitutional . . . . no acute distress. Respiratory Normal work of breathing on room air. Notes 08/08/2022: Significant improvement this week. She has very little tissue maceration and according to her aide, there has been very little drainage from her legs. There is some slough accumulation on the medial great toe ulcer. Edema control is improved bilaterally. Electronic Signature(s) Signed: 08/08/2022 11:00:56 AM By: [name] MD FACS Entered By: [name] on 08/08/2022 11:00:56 Marsh Dolly ([id number]) [id number] Page 3 of 9 -------------------------------------------------------------------------------- Physician Orders Details Patient Name: Date of Service: [name] 08/08/2022 9:15 A M Medical Record Number: [id number] Patient Account Number: [id number] Date of Birth/Sex: Treating RN: [birth year] (82 y.o. 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 - Dr. Lady Gary - Room 2 Return A Other: - DO NOT USE FOAM  BETWEEN TOES - LEAVE COMPRESSION WRAPS ON UNTIL NEXT WEEK, ONLY NEED TO CHANGE TOE DRESSING, PLEASE FOLLOW ORDERS ON WHAT TO USE Anesthetic (In clinic) Topical Lidocaine 4% applied to wound bed Bathing/ Shower/ Hygiene May shower and wash wound with soap and water. - keep toes and in between very dry. Use Silver Alginate between toes Edema Control - Lymphedema / SCD / Other Elevate legs to the level of the heart or  above for 30 minutes daily and/or when sitting for 3-4 times a day throughout the day. Non Wound Condition Other Non Wound Condition Orders/Instructions: - ZINC Unna boot on right lower leg - leave compression wraps on legs until next wound care appointment, only need to change the dressing on the toes (if needed for the right toes) Wound Treatment Wound #1 - T Great oe Wound Laterality: Dorsal, Left Cleanser: Soap and Water 1 x Per Day/30 Days Discharge Instructions: May shower and wash wound with dial antibacterial soap and water prior to dressing change. Cleanser: Wound Cleanser 1 x Per Day/30 Days Discharge Instructions: Cleanse the wound with wound cleanser prior to applying a clean dressing using gauze sponges, not tissue or cotton balls. Prim Dressing: Sorbalgon AG Dressing 2x2 (in/in) 1 x Per Day/30 Days ary Discharge Instructions: Weave the silver alginate between the left toes Secondary Dressing: Woven Gauze Sponge, Non-Sterile 4x4 in 1 x Per Day/30 Days Discharge Instructions: can use gauze between toes (if no desired) Wound #2 - T Great oe Wound Laterality: Left, Medial Cleanser: Soap and Water 1 x Per Day/30 Days Discharge Instructions: May shower and wash wound with dial antibacterial soap and water prior to dressing change. Cleanser: Wound Cleanser 1 x Per Day/30 Days Discharge Instructions: Cleanse the wound with wound cleanser prior to applying a clean dressing using gauze sponges, not tissue or cotton balls. Prim Dressing: Sorbalgon AG Dressing 2x2 (in/in) 1  x Per Day/30 Days ary Discharge Instructions: Weave the silver alginate between the left toes Secondary Dressing: Woven Gauze Sponge, Non-Sterile 4x4 in 1 x Per Day/30 Days Discharge Instructions: can use gauze between toes (if no desired) Wound #4 - Foot Wound Laterality: Plantar, Left Cleanser: Soap and Water 1 x Per Week/30 Days Discharge Instructions: May shower and wash wound with dial antibacterial soap and water prior to dressing change. Cleanser: Wound Cleanser 1 x Per Week/30 Days Discharge Instructions: Cleanse the wound with wound cleanser prior to applying a clean dressing using gauze sponges, not tissue or cotton balls. BURRUEL, Hanae ([id number]) [id number] Page 4 of 9 Peri-Wound Care: Zinc Oxide Ointment 30g tube 1 x Per Week/30 Days Discharge Instructions: Apply Zinc Oxide to periwound with each dressing change Prim Dressing: Sorbalgon AG Dressing 2x2 (in/in) 1 x Per Week/30 Days ary Discharge Instructions: Weave the silver alginate between the left toes Secondary Dressing: Woven Gauze Sponge, Non-Sterile 4x4 in 1 x Per Week/30 Days Discharge Instructions: can use gauze between toes (if no desired) Compression Wrap: CoFlex Zinc Unna Boot, 4 x 6 (in/yd) 1 x Per Week/30 Days Discharge Instructions: Left Lower leg Coflex Zinc Unna Boot or Use Zinc Unna boot Wound #5 - T Second oe Wound Laterality: Left Cleanser: Soap and Water 1 x Per Day/30 Days Discharge Instructions: May shower and wash wound with dial antibacterial soap and water prior to dressing change. Cleanser: Wound Cleanser 1 x Per Day/30 Days Discharge Instructions: Cleanse the wound with wound cleanser prior to applying a clean dressing using gauze sponges, not tissue or cotton balls. Prim Dressing: Sorbalgon AG Dressing 2x2 (in/in) 1 x Per Day/30 Days ary Discharge Instructions: Weave the silver alginate between the left toes Secondary Dressing: Woven Gauze Sponge, Non-Sterile 4x4  in 1 x Per Day/30 Days Discharge Instructions: can use gauze between toes (if no desired) Electronic Signature(s) Signed: 08/08/2022 1:04:04 PM By: [name] MD FACS Entered By: [name] on 08/08/2022 11:01:19 -------------------------------------------------------------------------------- Problem List Details Patient Name: Date of Service: [place] [name], [facility] RO N 08/08/2022  9:15 A M Medical Record Number: [id number] Patient Account Number: [id number] Date of Birth/Sex: Treating RN: [birth year] (82 y.o. 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 06/12/2022 No Yes 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 06/12/2022 No Yes 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 atrial fibrillation 06/12/2022 No Yes Z79.01 Long term (current) use of anticoagulants 06/12/2022 No Yes Cacioppo, Angenette ([id number]) [phone].pdf Page 5 of 9 Inactive Problems Resolved Problems Electronic Signature(s) Signed: 08/08/2022 10:58:20 AM By: [name] MD FACS Entered By: [name] on 08/08/2022 10:58:20 -------------------------------------------------------------------------------- Progress Note Details Patient Name: Date of Service: [place] [name], [facility] RO N 08/08/2022 9:15 A M Medical Record Number: [id number] Patient Account Number: [id number] Date of Birth/Sex: Treating RN: 1941-02-10 (82 y.o. F) Primary Care Provider: [name], [facility] Other Clinician: Referring  Provider: Treating Provider/Extender: [name], [facility] Weeks in Treatment: 8 Subjective Chief Complaint Information obtained from Patient Patient seen for complaints of Non-Healing Wound. History of Present Illness (HPI) ADMISSION 06/12/2022 This is an 82 year old woman with a history of CVA, crest syndrome, atrial fibrillation, rocker-bottom foot deformity. 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 She has been followed by podiatry for this and I am not entirely clear as to how she came to be referred here. She resides in an assisted living facility. It is not clear what they have been putting on her wound, but on intake, she was noted to have denuded skin on her medial third toe, as well as ulcers on her dorsal great toe and lateral great toe. There is slough accumulation on both of the toe ulcers. There was an odor noted at intake, but after her foot was washed, the odor dissipated. Her toes are folded on top of each other creating areas of abrasion and pockets for moisture collection, which seems to be the primary cause of her ulceration. 06/20/2022: The skin between her toes and on the ball of her foot is completely macerated. There has been more tissue breakdown. The wound on her great toe has some slough accumulation. 06/27/2022: No change to her wounds today. There has been no further deterioration, but no significant improvement. She was both hypotensive and bradycardic on intake. 07/11/2022: Today, her foot is completely macerated. She reports that the wound care nurse actually soaked her foot and then applied foam, despite our specific orders to not use foam at all. She also is draining serous fluid from both legs and has 2+ pitting edema. She is on furosemide 20 mg twice a day. 07/19/2022: Once again, her foot is completely macerated. There has been further tissue breakdown to the second and third toes and she now has ulcers  on the distal ends. The wounds on her medial and lateral great toe have thick slough accumulation. Apparently Xeroform was found between her toes on intake. Edema control is improved, but still not perfect. No overt drainage from her legs appreciated on exam today. 07/27/2022: She has less tissue maceration today. Edema control in her bilateral lower extremities is improved. Still with slough accumulation on  all open wound surfaces. 08/08/2022: Significant improvement this week. She has very little tissue maceration and according to her aide, there has been very little drainage from her legs. There is some slough accumulation on the medial great toe ulcer. Edema control is improved bilaterally. She is spending more time in her bed with her legs elevated and less in her wheelchair with her legs in a dependent position. Patient History Information obtained from Patient. Family History Unknown History. Social History Former smoker - smoked when she was young, Marital Status - Widowed, Alcohol Use - Never, Drug Use - No History, Caffeine Use - Daily. Medical History Hematologic/Lymphatic Patient has history of Anemia Cardiovascular Patient has history of Angina - a-fib, Hypertension, Vasculitis Endocrine Denies history of Type I Diabetes, Type II Diabetes Musculoskeletal Patient has history of Osteoarthritis Hospitalization/Surgery History - cholecystectomy. - leg surgery. - tonsillectomy. 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 Vitals Time Taken: 9:34 AM, Height: 67 in, Weight: 153 lbs, BMI: 24, Temperature: 97.9 F, Pulse: 97 bpm, Respiratory Rate: 20 breaths/min, Blood Pressure: 121/77 mmHg. Respiratory Normal work of breathing on room air. General  Notes: 08/08/2022: Significant improvement this week. She has very little tissue maceration and according to her aide, there has been very little drainage from her legs. There is some slough accumulation on the medial great toe ulcer. Edema control is improved bilaterally. Integumentary (Hair, Skin) Wound #1 status is Open. Original cause of wound was Footwear Injury. The date acquired was: 05/24/2021. The wound has been in treatment 8 weeks. The wound is located on the Left,Dorsal T Great. The wound measures 0.3cm length x 0.4cm width x 0.1cm depth; 0.094cm^2 area and 0.009cm^3 volume. There oe is Fat Layer (Subcutaneous Tissue) exposed. There is a medium amount of serous drainage noted. The wound margin is distinct with the outline attached to the wound base. There is medium (34-66%) pink, pale granulation within the wound bed. There is a medium (34-66%) amount of necrotic tissue within the wound bed including Adherent Slough. The periwound skin appearance exhibited: Scarring, Maceration. The periwound skin appearance did not exhibit: Dry/Scaly, Rubor. Periwound temperature was noted as No Abnormality. Wound #2 status is Open. Original cause of wound was Shear/Friction. The date acquired was: 05/24/2021. The wound has been in treatment 2 weeks. The wound is located on the Left,Medial T [place]. The wound measures 0.5cm length x 0.5cm width x 0.1cm depth; 0.196cm^2 area and 0.02cm^3 volume. There is Fat oe Layer (Subcutaneous Tissue) exposed. There is no tunneling or undermining noted. There is a medium amount of serosanguineous drainage noted. There is medium (34-66%) pink, pale granulation within the wound bed. There is a medium (34-66%) amount of necrotic tissue within the wound bed including Adherent Slough. The periwound skin appearance had no abnormalities noted for color. The periwound skin appearance exhibited: Scarring, Maceration. The periwound skin appearance did not exhibit: Dry/Scaly.  Periwound temperature was noted as No Abnormality. Wound #3 status is Healed - Epithelialized. Original cause of wound was Shear/Friction. The date acquired was: 05/24/2021. The wound has been in treatment 8 weeks. The wound is located on the Left,Medial T Third. The wound measures 0cm length x 0cm width x 0cm depth; 0cm^2 area and 0cm^3 volume. There is oe Fat Layer (Subcutaneous Tissue) exposed. There is no tunneling or undermining noted. There is a none present amount of drainage noted. The  wound margin is distinct with the outline attached to the wound base. There is no granulation within the wound bed. There is a medium (34-66%) amount of necrotic tissue within the wound bed. The periwound skin appearance had no abnormalities noted for texture. The periwound skin appearance had no abnormalities noted for color. The periwound skin appearance did not exhibit: Dry/Scaly, Maceration. Periwound temperature was noted as No Abnormality. Wound #4 status is Open. Original cause of wound was Skin T ear/Laceration. The date acquired was: 06/20/2022. The wound has been in treatment 7 weeks. The wound is located on the Left,Plantar Foot. The wound measures 0.1cm length x 0.1cm width x 0.1cm depth; 0.008cm^2 area and 0.001cm^3 volume. There is Fat Layer (Subcutaneous Tissue) exposed. There is no tunneling or undermining noted. There is a none present amount of drainage noted. The wound margin is distinct with the outline attached to the wound base. There is medium (34-66%) pink, pale granulation within the wound bed. There is a medium (34-66%) amount of necrotic tissue within the wound bed including Adherent Slough. The periwound skin appearance had no abnormalities noted for color. The periwound skin appearance exhibited: Scarring, Maceration. The periwound skin appearance did not exhibit: Dry/Scaly. Periwound temperature was noted as No Abnormality. Wound #5 status is Open. Original cause of wound was Gradually  Appeared. The date acquired was: 07/13/2022. The wound has been in treatment 2 weeks. The wound is located on the Left T Second. The wound measures 0.1cm length x 0.1cm width x 0.1cm depth; 0.008cm^2 area and 0.001cm^3 volume. There is oe Fat Layer (Subcutaneous Tissue) exposed. There is no tunneling or undermining noted. There is a medium amount of serosanguineous drainage noted. There is large (67-100%) red granulation within the wound bed. There is a small (1-33%) amount of necrotic tissue within the wound bed including Adherent Slough. The periwound skin appearance had no abnormalities noted for color. The periwound skin appearance exhibited: Scarring, Maceration. Periwound temperature was noted as No Abnormality. 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 There was a Selective/Open Wound Non-Viable Tissue Debridement oe with a total area of 0.25 sq cm performed by [name], MD. With the following instrument(s): Curette Material removed includes [facility] after achieving pain control using Lidocaine 4% Topical Solution. No specimens were taken. A time out was conducted at 10:24, prior to the start of the procedure. A Minimum amount of bleeding was controlled with Pressure. The procedure was tolerated well. Post Debridement Measurements: 0.5cm length x 0.5cm width x 0.1cm depth; 0.02cm^3 volume. Character of Wound/Ulcer Post Debridement is improved. Post  procedure Diagnosis Wound #2: Same as Pre-Procedure General Notes: Scribed for Dr. Lady Gary by Brenton Grills RN.Marland Kitchen Plan Follow-up Appointments: Return Appointment in 1 week. - Dr. Lady Gary - Room 2 Other: - DO NOT USE FOAM BETWEEN TOES - LEAVE COMPRESSION WRAPS ON UNTIL NEXT WEEK, ONLY NEED TO CHANGE TOE DRESSING, PLEASE FOLLOW ORDERS ON WHAT TO USE Anesthetic: (In clinic) Topical Lidocaine 4% applied to wound bed Bathing/ Shower/ Hygiene: May shower and wash wound with soap and water. - keep toes  and in between very dry. Use Silver Alginate between toes Edema Control - Lymphedema / SCD / Other: Elevate legs to the level of the heart or above for 30 minutes daily and/or when sitting for 3-4 times a day throughout the day. Non Wound Condition: Other Non Wound Condition Orders/Instructions: - ZINC Unna boot on right lower leg - leave compression wraps on legs until next wound care appointment, only need to change the dressing on the toes (if needed for the right toes) WOUND #1: - T Great Wound Laterality: Dorsal, Left oe Cleanser: Soap and Water 1 x Per Day/30 Days Discharge Instructions: May shower and wash wound with dial antibacterial soap and water prior to dressing change. Cleanser: Wound Cleanser 1 x Per Day/30 Days Discharge Instructions: Cleanse the wound with wound cleanser prior to applying a clean dressing using gauze sponges, not tissue or cotton balls. Prim Dressing: Sorbalgon AG Dressing 2x2 (in/in) 1 x Per Day/30 Days ary Discharge Instructions: Weave the silver alginate between the left toes Secondary Dressing: Woven Gauze Sponge, Non-Sterile 4x4 in 1 x Per Day/30 Days Discharge Instructions: can use gauze between toes (if no desired) WOUND #2: - T Great Wound Laterality: Left, Medial oe Cleanser: Soap and Water 1 x Per Day/30 Days Discharge Instructions: May shower and wash wound with dial antibacterial soap and water prior to dressing change. Cleanser: Wound Cleanser 1 x Per  Day/30 Days Discharge Instructions: Cleanse the wound with wound cleanser prior to applying a clean dressing using gauze sponges, not tissue or cotton balls. Prim Dressing: Sorbalgon AG Dressing 2x2 (in/in) 1 x Per Day/30 Days ary Discharge Instructions: Weave the silver alginate between the left toes Secondary Dressing: Woven Gauze Sponge, Non-Sterile 4x4 in 1 x Per Day/30 Days Discharge Instructions: can use gauze between toes (if no desired) WOUND #4: - Foot Wound Laterality: Plantar, Left Cleanser: Soap and Water 1 x Per Week/30 Days Discharge Instructions: May shower and wash wound with dial antibacterial soap and water prior to dressing change. Cleanser: Wound Cleanser 1 x Per Week/30 Days Discharge Instructions: Cleanse the wound with wound cleanser prior to applying a clean dressing using gauze sponges, not tissue or cotton balls. Peri-Wound Care: Zinc Oxide Ointment 30g tube 1 x Per Week/30 Days Discharge Instructions: Apply Zinc Oxide to periwound with each dressing change Prim Dressing: Sorbalgon AG Dressing 2x2 (in/in) 1 x Per Week/30 Days ary Discharge Instructions: Weave the silver alginate between the left toes Secondary Dressing: Woven Gauze Sponge, Non-Sterile 4x4 in 1 x Per Week/30 Days Discharge Instructions: can use gauze between toes (if no desired) Com pression Wrap: CoFlex Zinc Unna Boot, 4 x 6 (in/yd) 1 x Per Week/30 Days Discharge Instructions: Left Lower leg Coflex Zinc Unna Boot or Use Zinc Unna boot WOUND #5: - T Second Wound Laterality: Left oe Cleanser: Soap and Water 1 x Per Day/30 Days Discharge Instructions: May shower and wash wound with dial antibacterial soap and water prior to dressing change. Cleanser: Wound Cleanser 1 x Per Day/30 Days Discharge Instructions: Cleanse the wound with wound cleanser prior to applying a clean dressing using gauze sponges, not tissue or cotton balls. Prim Dressing: Sorbalgon AG Dressing 2x2 (in/in) 1 x Per Day/30  Days ary Discharge Instructions: Weave the silver alginate between the left toes Secondary Dressing: Woven Gauze Sponge, Non-Sterile 4x4 in 1 x Per Day/30 Days Discharge Instructions: can use gauze between toes (if no desired) 08/08/2022: Significant improvement this week. She has very little tissue maceration and according to her  aide, there has been very little drainage from her legs. There is some slough accumulation on the medial great toe ulcer. Edema control is improved bilaterally. I used a curette to debride slough from the great toe wound. We will continue bilateral zinc Unna boot's and silver alginate to the wound. Continue to we have silver alginate between the toes. Continue to avoid sitting for prolonged periods of time with legs in a dependent position. She will follow-up in 1 week. Electronic Signature(s) Signed: 08/08/2022 11:02:03 AM By: [name] MD FACS Entered By: [name] on 08/08/2022 11:02:03 Marsh Dolly ([id number]) [id number] Page 8 of 9 -------------------------------------------------------------------------------- HxROS Details Patient Name: Date of Service: [name] 08/08/2022 9:15 A M Medical Record Number: [id number] Patient Account Number: [id number] Date of Birth/Sex: Treating RN: 20-Mar-1941 (82 y.o. 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 Endocrine Medical History: Negative for: Type I Diabetes; Type II Diabetes Past Medical History Notes: hypothyroidism Musculoskeletal Medical History: Positive for: Osteoarthritis Past Medical History  Notes: crest syndrome Neurologic Medical History: Past Medical History Notes: stroke Immunizations Pneumococcal Vaccine: Received Pneumococcal Vaccination: Yes Received Pneumococcal Vaccination On or After 60th Birthday: Yes Implantable Devices None Hospitalization / Surgery History Type of Hospitalization/Surgery cholecystectomy leg surgery tonsillectomy Family and Social History Unknown History: Yes; Former smoker - smoked when she was young; Marital Status - Widowed; Alcohol Use: Never; Drug Use: No History; Caffeine Use: Daily; Financial Concerns: No; Food, Clothing or Shelter Needs: No; Support System Lacking: No; Transportation Concerns: No Electronic Signature(s) Signed: 08/08/2022 1:04:04 PM By: [name] MD FACS Entered By: [name] on 08/08/2022 11:00:23 SuperBill Details -------------------------------------------------------------------------------- Marsh Dolly ([id number]) [id number] Page 9 of 9 Patient Name: Date of Service: [name] 08/08/2022 Medical Record Number: [id number] Patient Account Number: [id number] Date of Birth/Sex: Treating RN: Aug 26, 1940 (82 y.o. 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 atrial fibrillation Z79.01 Long term (current) use of anticoagulants Facility Procedures CPT4 Code Description Modifier Quantity [id number] [phone] - DEBRIDE WOUND 1ST 20 SQ CM OR < 1 ICD-10  Diagnosis Description L97.522 Non-pressure chronic ulcer of other part of left foot with fat layer exposed Physician Procedures Quantity CPT4 Code Description Modifier [id number] - WC PHYS LEVEL 3 - EST PT 25 1 ICD-10 Diagnosis Description 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

## 2022-08-20 ENCOUNTER — Encounter (HOSPITAL_BASED_OUTPATIENT_CLINIC_OR_DEPARTMENT_OTHER): Payer: Medicare Other | Admitting: General Surgery

## 2022-08-20 DIAGNOSIS — L97522 Non-pressure chronic ulcer of other part of left foot with fat layer exposed: Secondary | ICD-10-CM | POA: Diagnosis not present

## 2022-08-21 NOTE — Progress Notes (Signed)
EVELLA, KASAL ([id number]) [id number] Page 1 of 10 Visit Report for 08/20/2022 Chief Complaint Document Details Patient Name: Date of Service: [name] 08/20/2022 1:15 PM Medical Record Number: [id number] Patient Account Number: [id number] Date of Birth/Sex: Treating RN: 06/07/1940 (82 y.o. F) Primary Care Provider: [name], [facility] Other Clinician: Referring Provider: Treating Provider/Extender: [name], [facility] Weeks in Treatment: 9 Information Obtained from: Patient Chief Complaint Patient seen for complaints of Non-Healing Wound. Electronic Signature(s) Signed: 08/20/2022 2:16:13 PM By: [name] MD FACS Entered By: [name] on 08/20/2022 14:16:13 -------------------------------------------------------------------------------- Debridement Details Patient Name: Date of Service: [name] [facility] RO N 08/20/2022 1:15 PM Medical Record Number: [id number] Patient Account Number: [id number] Date of Birth/Sex: Treating RN: [birth year] (82 y.o. Tommye Standard Primary Care Provider: [name], [facility] Other Clinician: Referring Provider: Treating Provider/Extender: [name], [facility] Weeks in Treatment: 9 Debridement Performed for Assessment: Wound #1 Left,Dorsal T Great oe Performed By: Physician [name], MD Debridement Type: Debridement Level of Consciousness (Pre-procedure): Awake and Alert Pre-procedure Verification/Time Out Yes - 13:35 Taken: Start Time: 13:38 Pain Control: Lidocaine 4% T opical Solution Percent of Wound Bed Debrided: 100% T Area Debrided (cm): otal 0.09 Tissue and other material debrided: Non-Viable, Slough, Slough Level: Non-Viable Tissue Debridement Description: Selective/Open Wound Instrument: Curette Bleeding: Minimum Hemostasis Achieved: Pressure Procedural Pain: 4 Post Procedural Pain: 2 Response to Treatment: Procedure was tolerated well Level of Consciousness  (Post- Awake and Alert procedure): Post Debridement Measurements of Total Wound Length: (cm) 0.3 Width: (cm) 0.4 Depth: (cm) 0.1 Volume: (cm) 0.009 Character of Wound/Ulcer Post Debridement: Stable Post Procedure Diagnosis Same as Pre-procedure Notes Scribed for Dr. Lady Gary by Zenaida Deed, RN Electronic Signature(s) Signed: 08/20/2022 4:00:45 PM By: [name] MD FACS Signed: 08/20/2022 4:53:54 PM By: Zenaida Deed RN, BSN [place], Kealey ([id number]) [phone].pdf Page 2 of 10 Entered By: Zenaida Deed on 08/20/2022 13:42:50 -------------------------------------------------------------------------------- HPI Details Patient Name: Date of Service: [name] 08/20/2022 1:15 PM Medical Record Number: [id number] Patient Account Number: [id number] Date of Birth/Sex: Treating RN: July 19, 1940 (82 y.o. F) Primary Care Provider: [name], [facility] Other Clinician: Referring Provider: Treating Provider/Extender: [name], [facility] Weeks in Treatment: 9 History of Present Illness HPI Description: ADMISSION 06/12/2022 This is an 82 year old woman with a history of CVA, crest syndrome, atrial fibrillation, rocker-bottom foot deformity. 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 She has been followed by podiatry for this and I am not entirely clear as to how she came to be referred here. She resides in an assisted living facility. It is not clear what they have been putting on her wound, but on intake, she was noted to have denuded skin on her medial third toe, as well as ulcers on her dorsal great toe and lateral great toe. There is slough accumulation on both of the toe ulcers. There was an odor noted at intake, but after her foot was washed, the odor dissipated. Her toes are folded on top of each other creating areas of abrasion and pockets for moisture collection, which seems to be the primary  cause of her ulceration. 06/20/2022: The skin between her toes and on the ball of her foot is completely macerated. There has been more tissue breakdown. The wound on her great toe has some slough accumulation. 06/27/2022: No change to her wounds today. There has been no further deterioration, but no significant improvement. She was both hypotensive  and bradycardic on intake. 07/11/2022: Today, her foot is completely macerated. She reports that the wound care nurse actually soaked her foot and then applied foam, despite our specific orders to not use foam at all. She also is draining serous fluid from both legs and has 2+ pitting edema. She is on furosemide 20 mg twice a day. 07/19/2022: Once again, her foot is completely macerated. There has been further tissue breakdown to the second and third toes and she now has ulcers on the distal ends. The wounds on her medial and lateral great toe have thick slough accumulation. Apparently Xeroform was found between her toes on intake. Edema control is improved, but still not perfect. No overt drainage from her legs appreciated on exam today. 07/27/2022: She has less tissue maceration today. Edema control in her bilateral lower extremities is improved. Still with slough accumulation on all open wound surfaces. 08/08/2022: Significant improvement this week. She has very little tissue maceration and according to her aide, there has been very little drainage from her legs. There is some slough accumulation on the medial great toe ulcer. Edema control is improved bilaterally. She is spending more time in her bed with her legs elevated and less in her wheelchair with her legs in a dependent position. 08/20/2022: The edema in her legs is now well-controlled with the use of the zinc Unna boots. Unfortunately, this seems to have resulted in more drainage coming from the open areas of her feet. They are a bit macerated but there has not been as much tissue breakdown secondary  to moisture as we have seen on previous visits. Electronic Signature(s) Signed: 08/20/2022 2:22:50 PM By: [name] MD FACS Entered By: [name] on 08/20/2022 14:22:50 -------------------------------------------------------------------------------- Physical Exam Details Patient Name: Date of Service: [name] [facility] RO N 08/20/2022 1:15 PM Medical Record Number: [id number] Patient Account Number: [id number] Date of Birth/Sex: Treating RN: Aug 02, 1940 (82 y.o. F) Primary Care Provider: [name], [facility] Other Clinician: Referring Provider: Treating Provider/Extender: [name] SHO KES, [facility] Weeks in Treatment: 9 Constitutional . . . . no acute distress. Respiratory Normal work of breathing on room air. Notes 08/20/2022: The edema in her legs is now well-controlled with the use of the zinc Unna boots. Unfortunately, this seems to have resulted in more drainage coming from the open areas of her feet. They are a bit macerated but there has not been as much tissue breakdown secondary to moisture as we have seen on previous visits. Electronic Signature(s) Signed: 08/20/2022 2:23:20 PM By: [name] MD FACS [place], [name] ([id number]) By: [name] MD FACS [phone].pdf Page 3 of 10 Signed: 08/20/2022 2:23:20 PM Entered By: [name] on 08/20/2022 14:23:20 -------------------------------------------------------------------------------- Physician Orders Details Patient Name: Date of Service: [name] 08/20/2022 1:15 PM Medical Record Number: [id number] Patient Account Number: [id number] Date of Birth/Sex: Treating RN: October 12, 1940 (82 y.o. 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 - Dr. Lady Gary - Room 2  Return A Other: - DO NOT USE FOAM BETWEEN TOES - LEAVE COMPRESSION WRAPS ON UNTIL NEXT WEEK, ONLY NEED TO CHANGE TOE DRESSING, PLEASE FOLLOW ORDERS ON WHAT TO USE Anesthetic (In clinic) Topical Lidocaine 4% applied to wound bed Bathing/ Shower/ Hygiene May shower with protection but do not get wound dressing(s) wet. Protect dressing(s) with water repellant cover (for example, large plastic bag) or a cast cover and may then take shower. Other Bathing/Shower/Hygiene Orders/Instructions: - wash between toes with soap and water and dry well Edema Control - Lymphedema / SCD / Other Elevate legs to the level of the heart or above for 30 minutes daily and/or when sitting for 3-4 times a day throughout the day. Non Wound Condition Right Lower Extremity Other Non Wound Condition Orders/Instructions: - urgo lit or 3 layer compression wrpa on right and left lower leg - leave compression wraps on legs until next wound care appointment, only need to change the dressing on the toes (if needed for the right toes) Wound Treatment Wound #1 - T Great oe Wound Laterality: Dorsal, Left Cleanser: Soap and Water 1 x Per Day/30 Days Discharge Instructions: May shower and wash wound with dial antibacterial soap and water prior to dressing change. Cleanser: Wound Cleanser 1 x Per Day/30 Days Discharge Instructions: Cleanse the wound with wound cleanser prior to applying a clean dressing using gauze sponges, not tissue or cotton balls. Peri-Wound Care: Ketoconazole Cream 2% 1  x Per Day/30 Days Discharge Instructions: Apply Ketoconazole mixed 1:1:1 on and around toes Peri-Wound Care: Triamcinolone 15 (g) 1 x Per Day/30 Days Discharge Instructions: Use triamcinolone 15 (g) as directed Peri-Wound Care: Zinc Oxide Ointment 30g tube 1 x Per Day/30 Days Discharge Instructions: Apply Zinc Oxide to periwound with each dressing change Prim Dressing: Sorbalgon AG Dressing 2x2 (in/in) 1 x Per Day/30 Days ary Discharge Instructions: Weave the silver alginate between the left toes Secondary Dressing: Woven Gauze Sponge, Non-Sterile 4x4 in 1 x Per Day/30 Days Discharge Instructions: can use gauze between toes (if no desired) Wound #2 - T Great oe Wound Laterality: Left, Medial Cleanser: Soap and Water 1 x Per Day/30 Days Discharge Instructions: May shower and wash wound with dial antibacterial soap and water prior to dressing change. SIERRIA, BRUNEY ([id number]) [id number] Page 4 of 10 Cleanser: Wound Cleanser 1 x Per Day/30 Days Discharge Instructions: Cleanse the wound with wound cleanser prior to applying a clean dressing using gauze sponges, not tissue or cotton balls. Peri-Wound Care: Ketoconazole Cream 2% 1 x Per Day/30 Days Discharge Instructions: Apply Ketoconazole mixed 1:1:1 on and around toes Peri-Wound Care: Triamcinolone 15 (g) 1 x Per Day/30 Days Discharge Instructions: Use triamcinolone 15 (g) as directed Peri-Wound Care: Zinc Oxide Ointment 30g tube 1 x Per Day/30 Days Discharge Instructions: Apply Zinc Oxide to periwound with each dressing change Prim Dressing: Sorbalgon AG Dressing 2x2 (in/in) 1 x Per Day/30 Days ary Discharge Instructions: Weave the silver alginate between the left toes Secondary Dressing: Woven Gauze Sponge, Non-Sterile 4x4 in 1 x Per Day/30 Days Discharge Instructions: can use gauze between toes (if no desired) Wound #5 - T Second oe Wound Laterality: Left Cleanser: Soap and Water 1 x Per Day/30  Days Discharge Instructions: May shower and wash wound with dial antibacterial soap and water prior to dressing change. Cleanser: Wound Cleanser 1 x Per Day/30 Days Discharge Instructions: Cleanse the wound with wound cleanser prior to applying a clean dressing using gauze sponges, not tissue or cotton balls. Peri-Wound Care: Ketoconazole Cream  2% 1 x Per Day/30 Days Discharge Instructions: Apply Ketoconazole mixed 1:1:1 on and around toes Peri-Wound Care: Triamcinolone 15 (g) 1 x Per Day/30 Days Discharge Instructions: Use triamcinolone 15 (g) as directed Peri-Wound Care: Zinc Oxide Ointment 30g tube 1 x Per Day/30 Days Discharge Instructions: Apply Zinc Oxide to periwound with each dressing change Prim Dressing: Sorbalgon AG Dressing 2x2 (in/in) 1 x Per Day/30 Days ary Discharge Instructions: Weave the silver alginate between the left toes Secondary Dressing: Woven Gauze Sponge, Non-Sterile 4x4 in 1 x Per Day/30 Days Discharge Instructions: can use gauze between toes (if no desired) Electronic Signature(s) Signed: 08/20/2022 4:00:45 PM By: [name] MD FACS Entered By: [name] on 08/20/2022 14:35:34 -------------------------------------------------------------------------------- Problem List Details Patient Name: Date of Service: [name], [facility] RO N 08/20/2022 1:15 PM Medical Record Number: [id number] Patient Account Number: [id number] Date of Birth/Sex: Treating RN: [birth year] (82 y.o. Tommye Standard Primary Care Provider: [name], [facility] Other Clinician: Referring Provider: Treating Provider/Extender: [name], [facility] Weeks in Treatment: 9 Active Problems ICD-10 Encounter Code Description Active Date MDM Diagnosis L97.522 Non-pressure chronic ulcer of other part of left foot with fat layer exposed 06/12/2022 No Yes L97.521 Non-pressure chronic ulcer of other part of left foot limited to breakdown of 06/12/2022 No Yes skin I10 Essential (primary)  hypertension 06/12/2022 No Yes Dowty, Jennipher ([id number]) [phone].pdf Page 5 of 10 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 06/12/2022 No Yes 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 06/12/2022 No Yes 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 atrial fibrillation 06/12/2022 No Yes Z79.01 Long term (current) use of anticoagulants 06/12/2022 No Yes I89.0 Lymphedema, not elsewhere classified 08/20/2022 No Yes Inactive Problems Resolved Problems Electronic Signature(s) Signed: 08/20/2022 2:14:23 PM By: [name] MD FACS Entered By: [name] on 08/20/2022 14:14:22 -------------------------------------------------------------------------------- Progress Note Details Patient Name: Date of Service: [name], [facility] RO N 08/20/2022 1:15 PM Medical Record Number: [id number] Patient Account Number: [id number] Date of Birth/Sex: Treating RN: July 17, 1940 (82 y.o. F) Primary Care Provider: [name], [facility] Other Clinician: Referring Provider: Treating Provider/Extender: [name], [facility] Weeks in Treatment: 9 Subjective Chief Complaint Information obtained from Patient Patient seen for complaints of Non-Healing Wound. History of Present Illness (HPI) ADMISSION 06/12/2022 This is an 82 year old woman with a history of CVA, crest syndrome, atrial fibrillation, rocker-bottom foot deformity. 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 She has been followed by podiatry for this and I am not entirely clear as to how she came to be referred here. She resides in an assisted living facility. It is not clear what they have been putting on her wound, but on intake, she was noted to have denuded skin on her medial third toe, as well as ulcers on her dorsal great toe and lateral great toe. There is slough accumulation on both of the toe ulcers. There was an odor noted at intake, but after her foot was washed, the odor  dissipated. Her toes are folded on top of each other creating areas of abrasion and pockets for moisture collection, which seems to be the primary cause of her ulceration. 06/20/2022: The skin between her toes and on the ball of her foot is completely macerated. There has been more tissue breakdown. The wound on her great toe has some slough accumulation. 06/27/2022: No change to her wounds today. There has been no further deterioration, but no significant improvement. She was both hypotensive and bradycardic on intake. 07/11/2022: Today, her  foot is completely macerated. She reports that the wound care nurse actually soaked her foot and then applied foam, despite our specific orders to not use foam at all. She also is draining serous fluid from both legs and has 2+ pitting edema. She is on furosemide 20 mg twice a day. 07/19/2022: Once again, her foot is completely macerated. There has been further tissue breakdown to the second and third toes and she now has ulcers on the distal ends. The wounds on her medial and lateral great toe have thick slough accumulation. Apparently Xeroform was found between her toes on intake. Edema control is improved, but still not perfect. No overt drainage from her legs appreciated on exam today. 07/27/2022: She has less tissue maceration today. Edema control in her bilateral lower extremities is improved. Still with slough accumulation on all open wound surfaces. 08/08/2022: Significant improvement this week. She has very little tissue maceration and according to her aide, there has been very little drainage from her legs. There is some slough accumulation on the medial great toe ulcer. Edema control is improved bilaterally. She is spending more time in her bed with her legs elevated and less in her wheelchair with her legs in a dependent position. NAJAH, LIVERMAN ([id number]) [id number] Page 6 of 10 08/20/2022: The edema in her legs is now  well-controlled with the use of the zinc Unna boots. Unfortunately, this seems to have resulted in more drainage coming from the open areas of her feet. They are a bit macerated but there has not been as much tissue breakdown secondary to moisture as we have seen on previous visits. Patient History Information obtained from Patient. Family History Unknown History. Social History Former smoker - smoked when she was young, Marital Status - Widowed, Alcohol Use - Never, Drug Use - No History, Caffeine Use - Daily. Medical History Hematologic/Lymphatic Patient has history of Anemia Cardiovascular Patient has history of Angina - a-fib, Hypertension, Vasculitis Endocrine Denies history of Type I Diabetes, Type II Diabetes Musculoskeletal Patient has history of Osteoarthritis Hospitalization/Surgery History - cholecystectomy. - leg surgery. - tonsillectomy. 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 Vitals Time Taken: 12:58 PM, Height: 67 in, Weight: 153 lbs, BMI: 24, Temperature: 98.2 F, Pulse: 82 bpm, Respiratory Rate: 18 breaths/min, Blood Pressure: 115/72 mmHg. Respiratory Normal work of breathing on room air. General Notes: 08/20/2022: The edema in her legs is now well-controlled with the use of the zinc Unna boots. Unfortunately, this seems to have resulted in more drainage coming from the open areas of her feet. They are a bit macerated but there has not been as much tissue breakdown secondary to moisture as we have seen on previous visits. Integumentary (Hair, Skin) Wound #1 status is Open. Original cause of wound was Footwear Injury. The date acquired was: 05/24/2021. The wound has been in treatment 9 weeks. The wound is located on the Left,Dorsal T Great. The wound measures 0.3cm length x 0.4cm width x 0.1cm depth; 0.094cm^2 area  and 0.009cm^3 volume. There oe is Fat Layer (Subcutaneous Tissue) exposed. There is no tunneling or undermining noted. There is a medium amount of serous drainage noted. The wound margin is flat and intact. There is large (67-100%) red, pink granulation within the wound bed. There is a small (1-33%) amount of necrotic tissue within the wound bed including Adherent Slough. The periwound skin appearance had no abnormalities noted for texture. The periwound skin  appearance had no abnormalities noted for color. The periwound skin appearance did not exhibit: Dry/Scaly, Maceration. Periwound temperature was noted as No Abnormality. The periwound has tenderness on palpation. Wound #2 status is Open. Original cause of wound was Shear/Friction. The date acquired was: 05/24/2021. The wound has been in treatment 4 weeks. The wound is located on the Left,Medial T [place]. The wound measures 0.2cm length x 0.3cm width x 0.1cm depth; 0.047cm^2 area and 0.005cm^3 volume. There is Fat oe Layer (Subcutaneous Tissue) exposed. There is no tunneling or undermining noted. There is a medium amount of serous drainage noted. The wound margin is flat and intact. There is large (67-100%) pink, pale granulation within the wound bed. There is a small (1-33%) amount of necrotic tissue within the wound bed including Adherent Slough. The periwound skin appearance had no abnormalities noted for color. The periwound skin appearance exhibited: Excoriation. The periwound skin appearance did not exhibit: Scarring, Dry/Scaly, Maceration. Periwound temperature was noted as No Abnormality. The periwound has tenderness on palpation. Wound #4 status is Open. Original cause of wound was Skin T ear/Laceration. The date acquired was: 06/20/2022. The wound has been in treatment 8 weeks. The wound is located on the Left,Plantar Foot. The wound measures 0cm length x 0cm width x 0cm depth; 0cm^2 area and 0cm^3 volume. There is no tunneling  or undermining noted. There is a none present amount of drainage noted. The wound margin is distinct with the outline attached to the wound base. There is no granulation within the wound bed. There is no necrotic tissue within the wound bed. The periwound skin appearance had no abnormalities noted for texture. The periwound skin appearance had no abnormalities noted for moisture. The periwound skin appearance had no abnormalities noted for color. Periwound temperature was noted as No Abnormality. Wound #5 status is Open. Original cause of wound was Gradually Appeared. The date acquired was: 07/13/2022. The wound has been in treatment 4 weeks. The wound is located on the Left T Second. The wound measures 0.3cm length x 0.3cm width x 0.1cm depth; 0.071cm^2 area and 0.007cm^3 volume. There is oe Fat Layer (Subcutaneous Tissue) exposed. There is no tunneling or undermining noted. There is a medium amount of serous drainage noted. The wound margin is flat and intact. There is large (67-100%) red granulation within the wound bed. There is a small (1-33%) amount of necrotic tissue within the wound bed including Adherent Slough. The periwound skin appearance had no abnormalities noted for moisture. The periwound skin appearance had no abnormalities noted Sanabia, Azhia ([id number]) [phone].pdf Page 7 of 10 for color. The periwound skin appearance exhibited: Excoriation. The periwound skin appearance did not exhibit: Scarring. Periwound temperature was noted as No Abnormality. The periwound has tenderness on palpation. 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 There was a Selective/Open Wound Non-Viable Tissue Debridement oe with a total area of 0.09 sq cm performed by [name], MD. With the following instrument(s): Curette to remove Non-Viable tissue/material. Material removed includes [facility] after achieving pain control using Lidocaine 4% Topical Solution. No specimens were taken. A time  out was conducted at 13:35, prior to the start of the procedure. A Minimum amount of bleeding was controlled with Pressure. The procedure was tolerated well with a pain level of 4 throughout and a pain level of 2 following the procedure. Post Debridement Measurements: 0.3cm length x 0.4cm width x 0.1cm depth; 0.009cm^3 volume. Character of Wound/Ulcer Post Debridement is stable. Post procedure Diagnosis Wound #1: Same as Pre-Procedure General Notes: Scribed for Dr. Lady Gary by Zenaida Deed, RN. There was a Double Layer Compression Therapy Procedure by Zenaida Deed, RN. Post procedure Diagnosis Wound #: Same as Pre-Procedure Notes: urgo lite. Plan Follow-up Appointments: Return Appointment in 1 week. - Dr. Lady Gary - Room 2 Other: - DO NOT USE FOAM BETWEEN TOES - LEAVE COMPRESSION WRAPS ON UNTIL NEXT WEEK, ONLY NEED TO CHANGE TOE DRESSING, PLEASE FOLLOW ORDERS ON WHAT TO USE Anesthetic: (In clinic) Topical Lidocaine 4% applied to wound bed Bathing/ Shower/ Hygiene: May shower with protection but do not get wound dressing(s) wet. Protect dressing(s) with water repellant cover (for example, large plastic bag) or a cast cover and may then take shower. Other Bathing/Shower/Hygiene Orders/Instructions: - wash between toes with soap and water and dry well Edema Control - Lymphedema / SCD / Other: Elevate legs to the level of the heart or above for 30 minutes daily and/or when sitting  for 3-4 times a day throughout the day. Non Wound Condition: Other Non Wound Condition Orders/Instructions: - urgo lit or 3 layer compression wrpa on right and left lower leg - leave compression wraps on legs until next wound care appointment, only need to change the dressing on the toes (if needed for the right toes) WOUND #1: - T Great Wound Laterality: Dorsal, Left oe Cleanser: Soap and Water 1 x Per Day/30 Days Discharge Instructions: May shower and wash wound with dial antibacterial soap and water prior to dressing change. Cleanser: Wound Cleanser 1 x Per Day/30 Days Discharge Instructions: Cleanse the wound with wound cleanser prior to applying a clean dressing using gauze sponges, not tissue or cotton balls. Peri-Wound Care: Ketoconazole Cream 2% 1 x Per Day/30 Days Discharge Instructions: Apply Ketoconazole mixed 1:1:1 on and around toes Peri-Wound Care: Triamcinolone 15 (g) 1 x Per Day/30 Days Discharge Instructions: Use triamcinolone 15 (g) as directed Peri-Wound Care: Zinc Oxide Ointment 30g tube 1 x Per Day/30 Days Discharge Instructions: Apply Zinc Oxide to periwound with each dressing change Prim Dressing: Sor[employer] Dressing 2x2 (in/in) 1 x Per Day/30 Days ary Discharge Instructions: Weave the silver alginate between the left toes Secondary Dressing: Woven Gauze Sponge, Non-Sterile 4x4 in 1 x Per Day/30 Days Discharge Instructions: can use gauze between toes (if no desired) WOUND #2: - T Great Wound Laterality: Left, Medial oe Cleanser: Soap and Water 1 x Per Day/30 Days Discharge Instructions: May shower and wash wound with dial antibacterial soap and water prior to dressing change. Cleanser: Wound Cleanser 1 x Per Day/30 Days Discharge Instructions: Cleanse the wound with wound cleanser prior to applying a clean dressing using gauze sponges, not tissue or cotton balls. Peri-Wound Care: Ketoconazole Cream 2% 1 x Per Day/30 Days Discharge Instructions: Apply Ketoconazole  mixed 1:1:1 on and around toes Peri-Wound Care: Triamcinolone 15 (g) 1 x Per Day/30 Days Discharge Instructions: Use triamcinolone 15 (g) as directed Peri-Wound Care: Zinc Oxide Ointment 30g tube 1 x Per Day/30 Days Discharge Instructions: Apply Zinc Oxide to periwound with each dressing change Dibenedetto, Hanny ([id number]) [phone].pdf Page 8 of 10 Prim Dressing: [employer]  2x2 (in/in) 1 x Per Day/30 Days ary Discharge Instructions: Weave the silver alginate between the left toes Secondary Dressing: Woven Gauze Sponge, Non-Sterile 4x4 in 1 x Per Day/30 Days Discharge Instructions: can use gauze between toes (if no desired) WOUND #5: - T Second Wound Laterality: Left oe Cleanser: Soap and Water 1 x Per Day/30 Days Discharge Instructions: May shower and wash wound with dial antibacterial soap and water prior to dressing change. Cleanser: Wound Cleanser 1 x Per Day/30 Days Discharge Instructions: Cleanse the wound with wound cleanser prior to applying a clean dressing using gauze sponges, not tissue or cotton balls. Peri-Wound Care: Ketoconazole Cream 2% 1 x Per Day/30 Days Discharge Instructions: Apply Ketoconazole mixed 1:1:1 on and around toes Peri-Wound Care: Triamcinolone 15 (g) 1 x Per Day/30 Days Discharge Instructions: Use triamcinolone 15 (g) as directed Peri-Wound Care: Zinc Oxide Ointment 30g tube 1 x Per Day/30 Days Discharge Instructions: Apply Zinc Oxide to periwound with each dressing change Prim Dressing: [employer] Dressing 2x2 (in/in) 1 x Per Day/30 Days ary Discharge Instructions: Weave the silver alginate between the left toes Secondary Dressing: Woven Gauze Sponge, Non-Sterile 4x4 in 1 x Per Day/30 Days Discharge Instructions: can use gauze between toes (if no desired) 08/20/2022: The edema in her legs is now well-controlled with the use of the zinc Unna boots. Unfortunately, this seems to have resulted in more drainage coming from  the open areas of her feet. They are a bit macerated but there has not been as much tissue breakdown secondary to moisture as we have seen on previous visits. I used a curette to debride the slough from the great toe ulcer. I could not find any other open areas that require debridement. Continue silver alginate to open areas and between toes. We will continue to apply bilateral compression wraps, but I think we can drop this to an Urgo K2 lite rather than the zinc Unna boots. We are going to apply a topical mixture of ketoconazole, triamcinolone, and zinc oxide to her feet to try and eliminate the tissue maceration, as well as the inflammation and irritation she seems to be experiencing. She will follow-up in 1 week. Electronic Signature(s) Signed: 08/20/2022 4:00:45 PM By: [name] MD FACS Signed: 08/20/2022 4:53:54 PM By: Zenaida Deed RN, BSN Previous Signature: 08/20/2022 2:36:53 PM Version By: [name] MD FACS Entered By: Zenaida Deed on 08/20/2022 15:59:40 -------------------------------------------------------------------------------- HxROS Details Patient Name: Date of Service: [name] [facility] RO N 08/20/2022 1:15 PM Medical Record Number: [id number] Patient Account Number: [id number] Date of Birth/Sex: Treating RN: 11/18/1940 (82 y.o. 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 Endocrine Medical History: Negative for: Type I Diabetes; Type II Diabetes Past Medical History Notes: hypothyroidism Musculoskeletal Medical History: Positive for: Osteoarthritis Past Medical History Notes: crest  syndrome Neurologic Barnaby, Charlena ([id number]) [id number] Page 9 of 10 Medical History: Past Medical History Notes: stroke Immunizations Pneumococcal Vaccine: Received Pneumococcal Vaccination: Yes Received Pneumococcal Vaccination On or After 60th Birthday: Yes Implantable Devices None Hospitalization / Surgery History Type of Hospitalization/Surgery cholecystectomy leg surgery tonsillectomy Family and Social History Unknown History: Yes; Former smoker - smoked when she was young; Marital Status - Widowed; Alcohol Use: Never; Drug Use: No History; Caffeine Use: Daily; Financial Concerns: No; Food,  Clothing or Shelter Needs: No; Support System Lacking: No; Transportation Concerns: No Electronic Signature(s) Signed: 08/20/2022 4:00:45 PM By: [name] MD FACS Entered By: [name] on 08/20/2022 14:22:56 -------------------------------------------------------------------------------- SuperBill Details Patient Name: Date of Service: [name], [facility] RO N 08/20/2022 Medical Record Number: [id number] Patient Account Number: [id number] Date of Birth/Sex: Treating RN: 01-Sep-1940 (82 y.o. 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 I10 Essential (primary) hypertension Quantity: 1 pdf Page 10 of 10 : [id number] - WC PHYS DEBR WO ANESTH 20 SQ CM 1 ICD-10 Diagnosis Description L97.522 Non-pressure chronic ulcer of other part of left foot with fat layer exposed Quantity: Electronic Signature(s) Signed: 08/20/2022 4:00:45 PM By: [name] MD FACS Signed: 08/20/2022 4:53:54 PM By: Zenaida Deed RN, BSN Previous Signature: 08/20/2022 2:38:15 PM Version By: [name] MD FACS Entered By: Zenaida Deed on 08/20/2022 15:59:24

## 2022-08-21 NOTE — Progress Notes (Signed)
CAELIE, REMSBURG ([id number]) [id number] Page 1 of 11 Visit Report for 08/20/2022 Arrival Information Details Patient Name: Date of Service: [name] 08/20/2022 1:15 PM Medical Record Number: [id number] Patient Account Number: [id number] Date of Birth/Sex: Treating RN: 03-26-1941 (82 y.o. [name] Primary Care [name]: [name], [facility] Other Clinician: Referring [name]: Treating [name]/Extender: [name], [name] Weeks in Treatment: 9 Visit Information History Since Last Visit Added or deleted any medications: No Patient Arrived: Wheel Chair Any new allergies or adverse reactions: No Arrival Time: 12:53 Had a fall or experienced change in No Accompanied By: caregiver activities of daily living that may affect Transfer Assistance: None risk of falls: Patient Identification Verified: Yes Signs or symptoms of abuse/neglect since last visito No Secondary Verification Process Completed: Yes Hospitalized since last visit: No Patient Requires Transmission-Based Precautions: No Implantable device outside of the clinic excluding No Patient Has Alerts: No cellular tissue based products placed in the Joseph since last visit: Has Dressing in Place as Prescribed: Yes Has Compression in Place as Prescribed: Yes Pain Present Now: No Electronic Signature(s) Signed: 08/20/2022 4:53:54 PM By: Kathryn Deed RN, BSN Entered By: [name] on 08/20/2022 12:59:49 -------------------------------------------------------------------------------- Compression Therapy Details Patient Name: Date of Service: [name] [facility] RO N 08/20/2022 1:15 PM Medical Record Number: [id number] Patient Account Number: [id number] Date of Birth/Sex: Treating RN: June 12, 1940 (82 y.o. [name] Primary Care [name]: [name], [facility] Other Clinician: Referring [name]: Treating [name]/Extender: [name] SHO [name], [facility] Weeks in Treatment:  9 Compression Therapy Performed for Wound Assessment: NonWound Condition Lymphedema - Bilateral Leg Performed By: Clinician Kathryn Deed, RN Compression Type: Double Layer Post Procedure Diagnosis Same as Pre-procedure Notes urgo lite Electronic Signature(s) Signed: 08/20/2022 4:53:54 PM By: Kathryn Deed RN, BSN Entered By: [name] on 08/20/2022 15:59:00 -------------------------------------------------------------------------------- Encounter Discharge Information Details Patient Name: Date of Service: [name] [facility] RO N 08/20/2022 1:15 PM Medical Record Number: [id number] Patient Account Number: [id number] Date of Birth/Sex: Treating RN: 01-11-1941 (82 y.o. [name] Primary Care [name]: [name], [place] [facility] Other Clinician: Referring [name]: Treating [name]/Extender: [name], [facility] Weeks in Treatment: 9 Encounter Discharge Information Items Post Procedure Vitals Discharge Condition: Stable Temperature (F): 98.2 [name], [name] ([id number]) [id number].pdf Page 2 of 11 Ambulatory Status: Wheelchair Pulse (bpm): 82 Discharge Destination: Other (Note Required) Respiratory Rate (breaths/min): 18 Telephoned: No Blood Pressure (mmHg): 115/72 Orders Sent: Yes Transportation: Other Accompanied By: caregiver Schedule Follow-up Appointment: Yes Clinical Summary of Care: Patient Declined Notes assisted living facility Electronic Signature(s) Signed: 08/20/2022 4:53:54 PM By: Kathryn Deed RN, BSN Entered By: [name] on 08/20/2022 16:01:14 -------------------------------------------------------------------------------- Lower Extremity Assessment Details Patient Name: Date of Service: [name] [name] RO N 08/20/2022 1:15 PM Medical Record Number: [id number] Patient Account Number: [id number] Date of Birth/Sex: Treating RN: 1940-09-30 (82 y.o. [name] Primary Care [name]: [name], [facility] Other  Clinician: Referring Jocabed Cheese: Treating [name]/Extender: [name] SHO [name], [facility] Weeks in Treatment: 9 Edema Assessment Assessed: [Left: No] [Right: No] Edema: [Left: Ye] [Right: s] Calf Left: Right: Point of Measurement: From Medial Instep 28.5 cm Ankle Left: Right: Point of Measurement: From Medial Instep 22 cm Vascular Assessment Pulses: Dorsalis Pedis Palpable: [Left:Yes] Electronic Signature(s) Signed: 08/20/2022 4:53:54 PM By: Kathryn Deed RN, BSN Entered By: [name] on 08/20/2022 13:04:44 -------------------------------------------------------------------------------- Multi Wound Chart Details Patient Name: Date of Service: [name], [name] RO N 08/20/2022 1:15 PM Medical Record Number: [id number] Patient Account Number: [id number] Date of Birth/Sex: Treating  RN: December 07, 1940 (82 y.o. F) Primary Care [name]: [name], [facility] Other Clinician: Referring [name]: Treating [name]/Extender: [name], [name] Weeks in Treatment: 9 Vital Signs Height(in): 67 Pulse(bpm): 82 Weight(lbs): 153 Blood Pressure(mmHg): 115/72 Body Mass Index(BMI): 24 Temperature(F): 98.2 Respiratory Rate(breaths/min): 18 [[name] ([id number]):Photos:] [[id number] Page 3 of 11:1 2 4] Left, Dorsal T Great oe Left, Medial T Great oe Left, Plantar Foot Wound Location: Footwear Injury Shear/Friction Skin T ear/Laceration Wounding Event: Abrasion Abrasion Skin T ear Primary Etiology: Anemia, Angina, Hypertension, Anemia, Angina, Hypertension, Anemia, Angina, Hypertension, Comorbid History: Vasculitis, Osteoarthritis Vasculitis, Osteoarthritis Vasculitis, Osteoarthritis 05/24/2021 05/24/2021 06/20/2022 Date Acquired: 9 4 8  Weeks of Treatment: Open Open Open Wound Status: No No No Wound Recurrence: 0.3x0.4x0.1 0.2x0.3x0.1 0x0x0 Measurements L x W x D (cm) 0.094 0.047 0 A (cm) : rea 0.009 0.005 0 Volume (cm) : 78.60%  89.30% 100.00% % Reduction in A rea: 79.50% 88.60% 100.00% % Reduction in Volume: 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 Classification: Support Structures Support Structures Support Structures Medium Medium None Present Exudate A mount: Serous Serous N/A Exudate Type: Media planner N/A Exudate Color: Flat and Intact Flat and Intact Distinct, outline attached Wound Margin: Large (67-100%) Large (67-100%) None Present (0%) Granulation A mount: Red, Pink Pink, Pale N/A Granulation Quality: Small (1-33%) Small (1-33%) None Present (0%) Necrotic A mount: Fat Layer (Subcutaneous Tissue): Yes Fat Layer (Subcutaneous Tissue): Yes Fascia: No Exposed Structures: Fascia: No Fascia: No Fat Layer (Subcutaneous Tissue): No Tendon: No Tendon: No Tendon: No Muscle: No Muscle: No Muscle: No Joint: No Joint: No Joint: No Bone: No Bone: No Bone: No Small (1-33%) Large (67-100%) Large (67-100%) Epithelialization: Debridement - Selective/Open Wound N/A N/A Debridement: Pre-procedure Verification/Time Out 13:35 N/A N/A Taken: Lidocaine 4% Topical Solution N/A N/A Pain Control: Slough N/A N/A Tissue Debrided: Non-Viable Tissue N/A N/A Level: 0.09 N/A N/A Debridement A (sq cm): rea Curette N/A N/A Instrument: Minimum N/A N/A Bleeding: Pressure N/A N/A Hemostasis A chieved: 4 N/A N/A Procedural Pain: 2 N/A N/A Post Procedural Pain: Procedure was tolerated well N/A N/A Debridement Treatment Response: 0.3x0.4x0.1 N/A N/A Post Debridement Measurements L x W x D (cm) 0.009 N/A N/A Post Debridement Volume: (cm) Scarring: Yes Excoriation: Yes Scarring: No Periwound Skin Texture: Scarring: No Maceration: No Maceration: No Maceration: No Periwound Skin Moisture: Dry/Scaly: No Dry/Scaly: No Dry/Scaly: No Rubor: No No Abnormalities Noted Rubor: Yes Periwound Skin Color: No Abnormality No Abnormality No  Abnormality Temperature: Yes Yes N/A Tenderness on Palpation: Debridement N/A N/A Procedures Performed: Wound Number: 5 N/A N/A Photos: N/A N/A Left T Second oe N/A N/A Wound Location: Gradually Appeared N/A N/A Wounding Event: Abrasion N/A N/A Primary Etiology: Anemia, Angina, Hypertension, N/A N/A Comorbid History: Vasculitis, Osteoarthritis 07/13/2022 N/A N/A Date Acquired: 4 N/A N/A Weeks of Treatment: Open N/A N/A Wound StatusSharilyn Joseph, [name] ([id number]) [id number] Page 4 of 11 No N/A N/A Wound Recurrence: 0.3x0.3x0.1 N/A N/A Measurements L x W x D (cm) 0.071 N/A N/A A (cm) : rea 0.007 N/A N/A Volume (cm) : -129.00% N/A N/A % Reduction in Area: -133.30% N/A N/A % Reduction in Volume: 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 N/A N/A Classification: Support Structures Medium N/A N/A Exudate A mount: Serous N/A N/A Exudate Type: amber N/A N/A Exudate Color: Flat and Intact N/A N/A Wound Margin: Large (67-100%) N/A N/A Granulation A mount: Red N/A N/A Granulation Quality: Small (1-33%) N/A N/A Necrotic A mount: Fat Layer (Subcutaneous Tissue): Yes N/A N/A Exposed  Structures: Medium (34-66%) N/A N/A Epithelialization: N/A N/A N/A Debridement: N/A N/A N/A Pain Control: N/A N/A N/A Tissue Debrided: N/A N/A N/A Level: N/A N/A N/A Debridement A (sq cm): rea N/A N/A N/A Instrument: N/A N/A N/A Bleeding: N/A N/A N/A Hemostasis A chieved: N/A N/A N/A Procedural Pain: N/A N/A N/A Post Procedural Pain: Debridement Treatment Response: N/A N/A N/A Post Debridement Measurements L x N/A N/A N/A W x D (cm) N/A N/A N/A Post Debridement Volume: (cm) Excoriation: Yes N/A N/A Periwound Skin Texture: Scarring: No Maceration: No N/A N/A Periwound Skin Moisture: No Abnormalities Noted N/A N/A Periwound Skin Color: No Abnormality N/A N/A Temperature: Yes N/A N/A Tenderness on Palpation: N/A N/A N/A Procedures  Performed: Treatment Notes Electronic Signature(s) Signed: 08/20/2022 2:16:04 PM By: Kathryn Guess MD FACS Entered By: [name] on 08/20/2022 14:16:04 -------------------------------------------------------------------------------- Multi-Disciplinary Care Plan Details Patient Name: Date of Service: [name], [facility] RO N 08/20/2022 1:15 PM Medical Record Number: [id number] Patient Account Number: [id number] Date of Birth/Sex: Treating RN: 1940/09/04 (82 y.o. [name] Primary Care Junia Nygren: [name], [facility] Other Clinician: Referring Isolde Skaff: Treating Shametra Cumberland/Extender: [name], [facility] Weeks in Treatment: 9 Active Inactive Necrotic Tissue Nursing Diagnoses: Impaired tissue integrity related to necrotic/devitalized tissue Knowledge deficit related to management of necrotic/devitalized tissue Goals: Necrotic/devitalized tissue will be minimized in the wound bed Date Initiated: 06/12/2022 Target Resolution Date: 10/26/2022 Goal Status: Active Patient/caregiver will verbalize understanding of reason and process for debridement of necrotic tissue Date Initiated: 06/12/2022 Target Resolution Date: 10/26/2022 Goal Status: Active Interventions: Assess patient pain level pre-, during and post procedure and prior to discharge Provide education on necrotic tissue and debridement process Shipman, Azara ([id number]) [id number] Page 5 of 11 Treatment Activities: Apply topical anesthetic as ordered : 06/12/2022 Notes: Wound/Skin Impairment Nursing Diagnoses: Impaired tissue integrity Knowledge deficit related to ulceration/compromised skin integrity Goals: Patient/caregiver will verbalize understanding of skin care regimen Date Initiated: 06/12/2022 Target Resolution Date: 10/26/2022 Goal Status: Active Interventions: Assess ulceration(s) every visit Treatment Activities: Skin care regimen initiated : 06/12/2022 Topical wound management  initiated : 06/12/2022 Notes: Electronic Signature(s) Signed: 08/20/2022 4:53:54 PM By: Kathryn Deed RN, BSN Entered By: [name] on 08/20/2022 13:25:40 -------------------------------------------------------------------------------- Pain Assessment Details Patient Name: Date of Service: [name] RO N 08/20/2022 1:15 PM Medical Record Number: [id number] Patient Account Number: [id number] Date of Birth/Sex: Treating RN: 09/13/1940 (82 y.o. [name] Primary Care Henretta Quist: [name], [facility] Other Clinician: Referring Giovany Cosby: Treating Gentle Hoge/Extender: [name], [facility] Weeks in Treatment: 9 Active Problems Location of Pain Severity and Description of Pain Patient Has Paino No Site Locations Rate the pain. Current Pain Level: 0 Pain Management and Medication Current Pain Management: Electronic Signature(s) Signed: 08/20/2022 4:53:54 PM By: Kathryn Deed RN, BSN Entered By: [name] on 08/20/2022 13:00:24 [name], [name] ([id number]) [id number] Page 6 of 11 -------------------------------------------------------------------------------- Patient/Caregiver Education Details Patient Name: Date of Service: [name] 4/29/2024andnbsp1:15 PM Medical Record Number: [id number] Patient Account Number: [id number] Date of Birth/Gender: Treating RN: 08-Oct-1940 (82 y.o. [name] Primary Care Physician: [name], [place] [facility] Other Clinician: Referring Physician: Treating Physician/Extender: [name], [facility] Weeks in Treatment: 9 Education Assessment Education Provided To: Patient Education Topics Provided Venous: Methods: Explain/Verbal Responses: Reinforcements needed, State content correctly Wound/Skin Impairment: Methods: Explain/Verbal Responses: Reinforcements needed, State content correctly Electronic Signature(s) Signed: 08/20/2022 4:53:54 PM By: Kathryn Deed RN, BSN Entered By:  [name] on 08/20/2022 13:25:59 -------------------------------------------------------------------------------- Wound Assessment Details Patient Name: Date  of ServiceMarcos Joseph 08/20/2022 1:15 PM Medical Record Number: [id number] Patient Account Number: [id number] Date of Birth/Sex: Treating RN: 07-26-1940 (82 y.o. [name] Primary Care Keora Eccleston: [name], [facility] Other Clinician: Referring Ester Mabe: Treating Charlestine Rookstool/Extender: [name] SHO [name], [facility] Weeks in Treatment: 9 Wound Status Wound Number: 1 Primary Etiology: Abrasion Wound Location: Left, Dorsal T Great oe Wound Status: Open Wounding Event: Footwear Injury Comorbid History: Anemia, Angina, Hypertension, Vasculitis, Osteoarthritis Date Acquired: 05/24/2021 Weeks Of Treatment: 9 Clustered Wound: No Photos Wound Measurements Length: (cm) 0.3 Width: (cm) 0.4 Depth: (cm) 0.1 Area: (cm) 0.094 Volume: (cm) 0.009 % Reduction in Area: 78.6% % Reduction in Volume: 79.5% Epithelialization: Small (1-33%) Tunneling: No Undermining: No Wound Description [name], [name] ([id number]) Classification: 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 Support Structures Wound Margin: Flat and Intact Exudate Amount: Medium Exudate Type: Serous Exudate Color: amber [id number] Page 7 of 11 Foul Odor After Cleansing: No Slough/Fibrino Yes Wound Bed Granulation Amount: Large (67-100%) Exposed Structure Granulation Quality: Red, Pink Fascia Exposed: No Necrotic Amount: Small (1-33%) Fat Layer (Subcutaneous Tissue) Exposed: Yes Necrotic Quality: Adherent Slough Tendon Exposed: No Muscle Exposed: No Joint Exposed: No Bone Exposed: No Periwound Skin Texture Texture Color No Abnormalities Noted: Yes No Abnormalities Noted: Yes Moisture Temperature / Pain No Abnormalities Noted: No Temperature: No Abnormality Dry / Scaly: No Tenderness on Palpation: Yes Maceration: No Treatment  Notes Wound #1 (Toe Great) Wound Laterality: Dorsal, Left Cleanser Soap and Water Discharge Instruction: May shower and wash wound with dial antibacterial soap and water prior to dressing change. Wound Cleanser Discharge Instruction: Cleanse the wound with wound cleanser prior to applying a clean dressing using gauze sponges, not tissue or cotton balls. Peri-Wound Care Ketoconazole Cream 2% Discharge Instruction: Apply Ketoconazole mixed 1:1:1 on and around toes Triamcinolone 15 (g) Discharge Instruction: Use triamcinolone 15 (g) as directed Zinc Oxide Ointment 30g tube Discharge Instruction: Apply Zinc Oxide to periwound with each dressing change Topical Primary Dressing Sorbalgon AG Dressing 2x2 (in/in) Discharge Instruction: Weave the silver alginate between the left toes Secondary Dressing Woven Gauze Sponge, Non-Sterile 4x4 in Discharge Instruction: can use gauze between toes (if no desired) Secured With Compression Wrap Compression Stockings Add-Ons Electronic Signature(s) Signed: 08/20/2022 4:53:54 PM By: Kathryn Deed RN, BSN Entered By: [name] on 08/20/2022 13:21:23 -------------------------------------------------------------------------------- Wound Assessment Details Patient Name: Date of Service: [name] [facility] RO N 08/20/2022 1:15 PM Medical Record Number: [id number] Patient Account Number: [id number] Date of Birth/Sex: Treating RN: July 23, 1940 (82 y.o. [name] Primary Care Jarvis Sawa: [name], [place] [facility] Other Clinician: Referring Demitrus Francisco: Treating Demarri Elie/Extender: [name], [facility] Weeks in Treatment: [address] [name] ([id number]) [id number] Page 8 of 11 Wound Status Wound Number: 2 Primary Etiology: Abrasion Wound Location: Left, Medial T Great oe Wound Status: Open Wounding Event: Shear/Friction Comorbid History: Anemia, Angina, Hypertension, Vasculitis, Osteoarthritis Date Acquired:  05/24/2021 Weeks Of Treatment: 4 Clustered Wound: No Photos Wound Measurements Length: (cm) 0.2 Width: (cm) 0.3 Depth: (cm) 0.1 Area: (cm) 0.047 Volume: (cm) 0.005 % Reduction in Area: 89.3% % Reduction in Volume: 88.6% Epithelialization: Large (67-100%) Tunneling: No Undermining: No Wound Description Classification: 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 Support Structures Wound Margin: Flat and Intact Exudate Amount: Medium Exudate Type: Serous Exudate Color: amber Foul Odor After Cleansing: No Slough/Fibrino Yes Wound Bed Granulation Amount: Large (67-100%) Exposed Structure Granulation Quality: Pink, Pale Fascia Exposed: No Necrotic Amount: Small (1-33%) Fat Layer (Subcutaneous Tissue) Exposed: Yes Necrotic Quality: Adherent Slough Tendon Exposed: No Muscle Exposed: No  Joint Exposed: No Bone Exposed: No Periwound Skin Texture Texture Color No Abnormalities Noted: No No Abnormalities Noted: Yes Excoriation: Yes Temperature / Pain Scarring: No Temperature: No Abnormality Tenderness on Palpation: Yes Moisture No Abnormalities Noted: No Dry / Scaly: No Maceration: No Treatment Notes Wound #2 (Toe Great) Wound Laterality: Left, Medial Cleanser Soap and Water Discharge Instruction: May shower and wash wound with dial antibacterial soap and water prior to dressing change. Wound Cleanser Discharge Instruction: Cleanse the wound with wound cleanser prior to applying a clean dressing using gauze sponges, not tissue or cotton balls. Peri-Wound Care Ketoconazole Cream 2% Discharge Instruction: Apply Ketoconazole mixed 1:1:1 on and around toes Triamcinolone 15 (g) Discharge Instruction: Use triamcinolone 15 (g) as directed Zinc Oxide Ointment 30g tube [name], [name] ([id number]) [phone].pdf Page 9 of 11 Discharge Instruction: Apply Zinc Oxide to periwound with each dressing change Topical Primary Dressing Sorbalgon AG Dressing 2x2  (in/in) Discharge Instruction: Weave the silver alginate between the left toes Secondary Dressing Woven Gauze Sponge, Non-Sterile 4x4 in Discharge Instruction: can use gauze between toes (if no desired) Secured With Compression Wrap Compression Stockings Add-Ons Electronic Signature(s) Signed: 08/20/2022 4:53:54 PM By: Kathryn Deed RN, BSN Entered By: [name] on 08/20/2022 13:22:30 -------------------------------------------------------------------------------- Wound Assessment Details Patient Name: Date of Service: [name] [facility] RO N 08/20/2022 1:15 PM Medical Record Number: [id number] Patient Account Number: [id number] Date of Birth/Sex: Treating RN: [birth year] (82 y.o. [name] Primary Care Shea Swalley: [name], [facility] Other Clinician: Referring Lyndel Dancel: Treating Yoshiko Keleher/Extender: [name] SHO [name], [facility] Weeks in Treatment: 9 Wound Status Wound Number: 4 Primary Etiology: Skin Tear Wound Location: Left, Plantar Foot Wound Status: Open Wounding Event: Skin Tear/Laceration Comorbid History: Anemia, Angina, Hypertension, Vasculitis, Osteoarthritis Date Acquired: 06/20/2022 Weeks Of Treatment: 8 Clustered Wound: No Photos Wound Measurements Length: (cm) Width: (cm) Depth: (cm) Area: (cm) Volume: (cm) 0 % Reduction in Area: 100% 0 % Reduction in Volume: 100% 0 Epithelialization: Large (67-100%) 0 Tunneling: No 0 Undermining: No Wound Description Classification: 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 Support Structures Wound Margin: Distinct, outline attached Exudate Amount: None Present Foul Odor After Cleansing: No Slough/Fibrino No Wound Bed Granulation Amount: None Present (0%) Exposed Structure Necrotic Amount: None Present (0%) Fascia Exposed: No Fat Layer (Subcutaneous Tissue) Exposed: No Tendon Exposed: No [name], [name] ([id number]) [id number] Page 10 of 11 Muscle Exposed: No Joint Exposed: No Bone  Exposed: No Periwound Skin Texture Texture Color No Abnormalities Noted: Yes No Abnormalities Noted: Yes Moisture Temperature / Pain No Abnormalities Noted: Yes Temperature: No Abnormality Electronic Signature(s) Signed: 08/20/2022 4:53:54 PM By: Kathryn Deed RN, BSN Entered By: [name] on 08/20/2022 13:23:13 -------------------------------------------------------------------------------- Wound Assessment Details Patient Name: Date of Service: [name] [facility] RO N 08/20/2022 1:15 PM Medical Record Number: [id number] Patient Account Number: [id number] Date of Birth/Sex: Treating RN: 1940-10-31 (82 y.o. [name] Primary Care Juliauna Stueve: [name], [facility] Other Clinician: Referring Chriss Redel: Treating Anastasio Wogan/Extender: [name] SHO [name], [facility] Weeks in Treatment: 9 Wound Status Wound Number: 5 Primary Etiology: Abrasion Wound Location: Left T Second oe Wound Status: Open Wounding Event: Gradually Appeared Comorbid History: Anemia, Angina, Hypertension, Vasculitis, Osteoarthritis Date Acquired: 07/13/2022 Weeks Of Treatment: 4 Clustered Wound: No Photos Wound Measurements Length: (cm) 0.3 Width: (cm) 0.3 Depth: (cm) 0.1 Area: (cm) 0.071 Volume: (cm) 0.007 % Reduction in Area: -129% % Reduction in Volume: -133.3% Epithelialization: Medium (34-66%) Tunneling: No Undermining: No Wound Description Classification: 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 Support Structures Wound Margin: Flat and Intact Exudate Amount: Medium Exudate  Type: Serous Exudate Color: amber Foul Odor After Cleansing: No Wound Bed Granulation Amount: Large (67-100%) Exposed Structure Granulation Quality: Red Fat Layer (Subcutaneous Tissue) Exposed: Yes Necrotic Amount: Small (1-33%) Necrotic Quality: Adherent Slough Periwound Skin Texture Texture Color No Abnormalities Noted: No No Abnormalities Noted: Yes Excoriation: Yes Temperature / Pain [name], [name] ([id number])  [phone].pdf Page 11 of 11 Excoriation: Yes Temperature / Pain Scarring: No Temperature: No Abnormality Tenderness on Palpation: Yes Moisture No Abnormalities Noted: Yes Treatment Notes Wound #5 (Toe Second) Wound Laterality: Left Cleanser Soap and Water Discharge Instruction: May shower and wash wound with dial antibacterial soap and water prior to dressing change. Wound Cleanser Discharge Instruction: Cleanse the wound with wound cleanser prior to applying a clean dressing using gauze sponges, not tissue or cotton balls. Peri-Wound Care Ketoconazole Cream 2% Discharge Instruction: Apply Ketoconazole mixed 1:1:1 on and around toes Triamcinolone 15 (g) Discharge Instruction: Use triamcinolone 15 (g) as directed Zinc Oxide Ointment 30g tube Discharge Instruction: Apply Zinc Oxide to periwound with each dressing change Topical Primary Dressing Sorbalgon AG Dressing 2x2 (in/in) Discharge Instruction: Weave the silver alginate between the left toes Secondary Dressing Woven Gauze Sponge, Non-Sterile 4x4 in Discharge Instruction: can use gauze between toes (if no desired) Secured With Compression Wrap Compression Stockings Add-Ons Electronic Signature(s) Signed: 08/20/2022 4:53:54 PM By: Kathryn Deed RN, BSN Entered By: [name] on 08/20/2022 13:23:58 -------------------------------------------------------------------------------- Vitals Details Patient Name: Date of Service: [name], [facility] RO N 08/20/2022 1:15 PM Medical Record Number: [id number] Patient Account Number: [id number] Date of Birth/Sex: Treating RN: Sep 09, 1940 (82 y.o. [name] Primary Care [place]ne Scarpati: [name], [facility] Other Clinician: Referring Kayloni Rocco: Treating Taiden Raybourn/Extender: [name] SHO [name], [facility] Weeks in Treatment: 9 Vital Signs Time Taken: 12:58 Temperature (F): 98.2 Height (in): 67 Pulse (bpm): 82 Weight (lbs): 153 Respiratory Rate (breaths/min):  18 Body Mass Index (BMI): 24 Blood Pressure (mmHg): 115/72 Reference Range: 80 - 120 mg / dl Electronic Signature(s) Signed: 08/20/2022 4:53:54 PM By: Kathryn Deed RN, BSN Entered By: [name] on 08/20/2022 13:00:15

## 2022-08-28 ENCOUNTER — Encounter (HOSPITAL_BASED_OUTPATIENT_CLINIC_OR_DEPARTMENT_OTHER): Payer: Medicare Other | Attending: General Surgery | Admitting: General Surgery

## 2022-08-28 DIAGNOSIS — Z7901 Long term (current) use of anticoagulants: Secondary | ICD-10-CM | POA: Insufficient documentation

## 2022-08-28 DIAGNOSIS — I10 Essential (primary) hypertension: Secondary | ICD-10-CM | POA: Diagnosis not present

## 2022-08-28 DIAGNOSIS — M341 CR(E)ST syndrome: Secondary | ICD-10-CM | POA: Insufficient documentation

## 2022-08-28 DIAGNOSIS — Z87891 Personal history of nicotine dependence: Secondary | ICD-10-CM | POA: Diagnosis not present

## 2022-08-28 DIAGNOSIS — Z8673 Personal history of transient ischemic attack (TIA), and cerebral infarction without residual deficits: Secondary | ICD-10-CM | POA: Diagnosis not present

## 2022-08-28 DIAGNOSIS — I48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 atrial fibrillation: Secondary | ICD-10-CM | POA: Diagnosis not present

## 2022-08-28 DIAGNOSIS — L97522 Non-pressure chronic ulcer of other part of left foot with fat layer exposed: Secondary | ICD-10-CM | POA: Diagnosis present

## 2022-08-28 DIAGNOSIS — I890 Lymphedema, not elsewhere classified: Secondary | ICD-10-CM | POA: Insufficient documentation

## 2022-08-29 NOTE — Progress Notes (Signed)
[name] ([id number]) [id number] Page 1 of 9 Visit Report for 08/28/2022 Chief Complaint Document Details Patient Name: Date of Service: [name] 08/28/2022 12:30 PM Medical Record Number: [id number] Patient Account Number: [id number] Date of Birth/Sex: Treating RN: Aug 18, 1940 (82 y.o. F) Primary Care Provider: [name], [facility] Other Clinician: Referring Provider: Treating Provider/Extender: [name], [facility] Weeks in Treatment: 11 Information Obtained from: Patient Chief Complaint Patient seen for complaints of Non-Healing Wound. Electronic Signature(s) Signed: 08/28/2022 1:35:30 PM By: [name] MD FACS Entered By: [name] on 08/28/2022 13:35:30 -------------------------------------------------------------------------------- Debridement Details Patient Name: Date of Service: [name], [facility] [name] 08/28/2022 12:30 PM Medical Record Number: [id number] Patient Account Number: [id number] Date of Birth/Sex: Treating RN: December 09, 1940 (82 y.o. [name] Primary Care Provider: [name], [facility] Other Clinician: Referring Provider: Treating Provider/Extender: [name], [facility] Weeks in Treatment: 11 Debridement Performed for Assessment: Wound #1 Left,Dorsal T Great oe Performed By: Physician [name], MD Debridement Type: Debridement Level of Consciousness (Pre-procedure): Awake and Alert Pre-procedure Verification/Time Out Yes - 13:19 Taken: Start Time: 13:19 Pain Control: Lidocaine 4% T opical Solution Percent of Wound Bed Debrided: 100% T Area Debrided (cm): otal 0.01 Tissue and other material debrided: Non-Viable, Slough, Slough Level: Non-Viable Tissue Debridement Description: Selective/Open Wound Instrument: Curette Bleeding: Minimum Hemostasis Achieved: Pressure End Time: 13:20 Procedural Pain: 0 Post Procedural Pain: 0 Response to Treatment: Procedure was tolerated well Level  of Consciousness (Post- Awake and Alert procedure): Post Debridement Measurements of Total Wound Length: (cm) 0.1 Width: (cm) 0.1 Depth: (cm) 0.1 Volume: (cm) 0.001 Character of Wound/Ulcer Post Debridement: Improved Post Procedure Diagnosis Same as Pre-procedure Notes Scribed for Dr. Lady Gary by J.Scotton Electronic Signature(s) Signed: 08/28/2022 3:59:13 PM By: [name] MD FACS [name], [name] ([id number]) [id number] Page 2 of 9 Signed: 08/28/2022 5:32:19 PM By: Karie Schwalbe RN Entered By: Karie Schwalbe on 08/28/2022 13:22:55 -------------------------------------------------------------------------------- HPI Details Patient Name: Date of Service: [name], [facility] [name] 08/28/2022 12:30 PM Medical Record Number: [id number] Patient Account Number: [id number] Date of Birth/Sex: Treating RN: Nov 21, 1940 (82 y.o. F) Primary Care Provider: [name], [facility] Other Clinician: Referring Provider: Treating Provider/Extender: [name], [facility] Weeks in Treatment: 11 History of Present Illness HPI Description: ADMISSION 06/12/2022 This is an 82 year old woman with a history of CVA, crest syndrome, atrial fibrillation, rocker-bottom foot deformity. 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 She has been followed by podiatry for this and I am not entirely clear as to how she came to be referred here. She resides in an assisted living facility. It is not clear what they have been putting on her wound, but on intake, she was noted to have denuded skin on her medial third toe, as well as ulcers on her dorsal great toe and lateral great toe. There is slough accumulation on both of the toe ulcers. There was an odor noted at intake, but after her foot was washed, the odor dissipated. Her toes are folded on top of each other creating areas of abrasion and pockets for moisture collection, which seems to be the  primary cause of her ulceration. 06/20/2022: The skin between her toes and on the ball of her foot is completely macerated. There has been more tissue breakdown. The wound on her great toe has some slough accumulation. 06/27/2022: No change to her wounds today. There has been no further deterioration, but no significant improvement. She was both hypotensive  and bradycardic on intake. 07/11/2022: Today, her foot is completely macerated. She reports that the wound care nurse actually soaked her foot and then applied foam, despite our specific orders to not use foam at all. She also is draining serous fluid from both legs and has 2+ pitting edema. She is on furosemide 20 mg twice a day. 07/19/2022: Once again, her foot is completely macerated. There has been further tissue breakdown to the second and third toes and she now has ulcers on the distal ends. The wounds on her medial and lateral great toe have thick slough accumulation. Apparently Xeroform was found between her toes on intake. Edema control is improved, but still not perfect. No overt drainage from her legs appreciated on exam today. 07/27/2022: She has less tissue maceration today. Edema control in her bilateral lower extremities is improved. Still with slough accumulation on all open wound surfaces. 08/08/2022: Significant improvement this week. She has very little tissue maceration and according to her aide, there has been very little drainage from her legs. There is some slough accumulation on the medial great toe ulcer. Edema control is improved bilaterally. She is spending more time in her bed with her legs elevated and less in her wheelchair with her legs in a dependent position. 08/20/2022: The edema in her legs is now well-controlled with the use of the zinc Unna boots. Unfortunately, this seems to have resulted in more drainage coming from the open areas of her feet. They are a bit macerated but there has not been as much tissue breakdown  secondary to moisture as we have seen on previous visits. 08/28/2022: The only remaining open wound in her foot is on the dorsal great toe. The improvement in the rest of the foot is quite dramatic, with no tissue maceration or breakdown. She has been elevating her legs and wearing compression wraps. Edema control is excellent and there has been no drainage from her legs. Electronic Signature(s) Signed: 08/28/2022 1:59:08 PM By: [name] MD FACS Previous Signature: 08/28/2022 1:36:19 PM Version By: [name] MD FACS Entered By: [name] on 08/28/2022 13:59:08 -------------------------------------------------------------------------------- Physical Exam Details Patient Name: Date of Service: [name] [facility] [name] 08/28/2022 12:30 PM Medical Record Number: [id number] Patient Account Number: [id number] Date of Birth/Sex: Treating RN: 07/16/1940 (82 y.o. F) Primary Care Provider: [name], [facility] Other Clinician: Referring Provider: Treating Provider/Extender: [name] SHO KES, [facility] Weeks in Treatment: 11 Constitutional . . . . no acute distress. Respiratory Normal work of breathing on room air. Notes GOPAL, Florine ([id number]) [id number] Page 3 of 9 08/28/2022: The only remaining open wound in her foot is on the dorsal great toe. The improvement in the rest of the foot is quite dramatic, with no tissue maceration or breakdown. Edema control is excellent and there has been no drainage from her legs. Electronic Signature(s) Signed: 08/28/2022 2:00:17 PM By: [name] MD FACS Entered By: [name] on 08/28/2022 14:00:17 -------------------------------------------------------------------------------- Physician Orders Details Patient Name: Date of Service: [name] [facility] [name] 08/28/2022 12:30 PM Medical Record Number: [id number] Patient Account Number: [id number] Date of Birth/Sex: Treating RN: March 18, 1941 (82 y.o. 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 - Dr. Lady Gary - Room 2 Return A Other: - DO NOT USE FOAM BETWEEN TOES - LEAVE COMPRESSION WRAPS ON UNTIL NEXT WEEK, ONLY NEED TO CHANGE TOE DRESSING, PLEASE FOLLOW ORDERS ON WHAT TO USE Anesthetic (In clinic) Topical Lidocaine 4% applied to wound bed - USED in Clinic Bathing/ Shower/ Hygiene May shower with protection but do not get wound dressing(s) wet. Protect dressing(s) with water repellant cover (for example, large plastic bag) or a cast cover and may then take shower. Other Bathing/Shower/Hygiene Orders/Instructions: - wash between toes with soap and water and dry well. Edema Control - Lymphedema / SCD / Other Elevate legs to the level of the heart or above for 30 minutes daily and/or when sitting for 3-4 times a day throughout the day. Non Wound Condition Right Lower Extremity Other Non Wound Condition Orders/Instructions: - urgo lite or 3 layer compression wrap on right and left lower leg - leave compression wraps on legs until next wound care appointment, only need to change the dressing on the toes (left). Change Left T dressing 3 x week. oes Home Health Wound #1 Left,Dorsal T Great oe Other Home Health Orders/Instructions: - [facility]. Change Left toe dressing at least 3 x week. Please weave silver alginate between the left  toes. Use Ketoconazole,Triamcinolone cream and Zinc oxide in equal parts to be placed on the left toes/forefoot. Wound Treatment Wound #1 - T Great oe Wound Laterality: Dorsal, Left Cleanser: Soap and Water Other:3 x week and PRN/30 Days Discharge Instructions: May shower and wash wound with dial antibacterial soap and water prior to dressing change. Cleanser: Wound Cleanser Other:3 x week and PRN/30 Days Discharge Instructions: Cleanse the wound with wound cleanser prior to applying a clean dressing using gauze sponges, not tissue or cotton balls. Peri-Wound Care: Ketoconazole Cream 2% Other:3 x week and PRN/30 Days Discharge Instructions: Apply Ketoconazole mixed 1:1:1 on and around toes Peri-Wound Care: Triamcinolone 15 (g) Other:3 x week and PRN/30 Days Discharge Instructions: Use triamcinolone 15 (g) as directed Peri-Wound Care: Zinc Oxide Ointment 30g tube Other:3 x week and PRN/30 Days Discharge Instructions: Apply Zinc Oxide to periwound with each dressing change [name], [name] ([id number]) [phone].pdf Page 4 of 9 Prim Dressing: Sorbalgon AG Dressing 2x2 (in/in) Other:3 x week and PRN/30 Days ary Discharge Instructions: Weave the silver alginate between the left toes Secondary Dressing: Woven Gauze Sponge, Non-Sterile 4x4 in Other:3 x week and PRN/30 Days Discharge Instructions: can use gauze between toes (if no desired) Electronic Signature(s) Signed: 08/28/2022 3:59:13 PM By: [name] MD FACS Entered By: [name] on 08/28/2022 14:01:11 -------------------------------------------------------------------------------- Problem List Details Patient Name: Date of Service: [name] [facility] [name] 08/28/2022 12:30 PM Medical Record Number: [id number] Patient Account Number: [id number] Date of Birth/Sex: Treating RN: 01/08/1941 (82 y.o. 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 06/12/2022 No Yes 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 atrial fibrillation 06/12/2022 No Yes Z79.01 Long term (current) use of anticoagulants 06/12/2022 No Yes  I89.0 Lymphedema, not elsewhere classified 08/20/2022 No Yes Inactive Problems ICD-10 Code Description Active Date Inactive Date L97.521 Non-pressure chronic ulcer of other part of left foot limited to breakdown of skin 06/12/2022 06/12/2022 Resolved Problems Electronic Signature(s) Signed: 08/28/2022 1:35:11 PM By: [name] MD FACS Entered By: [name] on 08/28/2022 13:35:10 [name], [name] ([id number]) [id number] Page 5 of 9 -------------------------------------------------------------------------------- Progress Note Details Patient Name: Date of Service: [name] 08/28/2022 12:30 PM Medical Record Number: [id number] Patient Account Number: [id number] Date of Birth/Sex: Treating RN: 12/17/1940 (82 y.o. F) Primary Care Provider: [name], [facility] Other Clinician: Referring Provider: Treating Provider/Extender: [name], [facility] Weeks in Treatment: 11 Subjective Chief Complaint Information obtained from Patient Patient seen for complaints of Non-Healing Wound. History of Present Illness (HPI) ADMISSION 06/12/2022 This is an 82 year old woman with a history of CVA, crest syndrome, atrial fibrillation, rocker-bottom foot deformity. 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 She has been followed by podiatry for this and I am not entirely clear  as to how she came to be referred here. She resides in an assisted living facility. It is not clear what they have been putting on her wound, but on intake, she was noted to have denuded skin on her medial third toe, as well as ulcers on her dorsal great toe and lateral great toe. There is slough accumulation on both of the toe ulcers. There was an odor noted at intake, but after her foot was washed, the odor dissipated. Her toes are folded on top of each other creating areas of abrasion and pockets for moisture collection, which seems to be the primary cause of her ulceration. 06/20/2022: The skin between her toes and on the ball of her foot is completely macerated. There has been more tissue breakdown. The wound on her great toe has some slough accumulation. 06/27/2022: No change to her wounds today. There has been no further deterioration, but no significant improvement. She was both hypotensive and bradycardic on intake. 07/11/2022: Today, her foot is completely macerated. She reports that the wound care nurse actually soaked her foot and then applied foam, despite our specific orders to not use foam at all. She also is draining serous fluid from both legs and has 2+ pitting edema. She is on furosemide 20 mg twice a day. 07/19/2022: Once again, her foot is completely macerated. There has been further tissue breakdown to the second and third toes and she now has ulcers on the distal ends. The wounds on her medial and lateral great toe have thick slough accumulation. Apparently Xeroform was found between her toes on intake. Edema control is improved, but still not perfect. No overt drainage from her legs appreciated on exam today. 07/27/2022: She has less tissue maceration today. Edema control in her bilateral lower extremities is improved. Still with slough accumulation on all open wound surfaces. 08/08/2022: Significant improvement this week. She has very little tissue maceration and according to her  aide, there has been very little drainage from her legs. There is some slough accumulation on the medial great toe ulcer. Edema control is improved bilaterally. She is spending more time in her bed with her legs elevated and less in her wheelchair with her legs in a dependent position. 08/20/2022: The edema in her legs is now well-controlled with the use of the zinc Unna boots. Unfortunately, this seems to have resulted in more drainage coming from the open areas of her  feet. They are a bit macerated but there has not been as much tissue breakdown secondary to moisture as we have seen on previous visits. 08/28/2022: The only remaining open wound in her foot is on the dorsal great toe. The improvement in the rest of the foot is quite dramatic, with no tissue maceration or breakdown. She has been elevating her legs and wearing compression wraps. Edema control is excellent and there has been no drainage from her legs. Patient History Information obtained from Patient. Family History Unknown History. Social History Former smoker - smoked when she was young, Marital Status - Widowed, Alcohol Use - Never, Drug Use - No History, Caffeine Use - Daily. Medical History Hematologic/Lymphatic Patient has history of Anemia Cardiovascular Patient has history of Angina - a-fib, Hypertension, Vasculitis Endocrine Denies history of Type I Diabetes, Type II Diabetes Musculoskeletal Patient has history of Osteoarthritis Hospitalization/Surgery History - cholecystectomy. - leg surgery. - tonsillectomy. 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 Vitals Time Taken: 12:38 PM, Height: 67 in, Weight: 153 lbs, BMI: 24, Temperature: 98.6 F, Pulse: 94 bpm, Respiratory Rate: 18  breaths/min, Blood Pressure: 123/83 mmHg. Respiratory Normal work of breathing on room air. General Notes: 08/28/2022: The only remaining open wound in her foot is on the dorsal great toe. The improvement in the rest of the foot is quite dramatic, with no tissue maceration or breakdown. Edema control is excellent and there has been no drainage from her legs. Integumentary (Hair, Skin) Wound #1 status is Open. Original cause of wound was Footwear Injury. The date acquired was: 05/24/2021. The wound has been in treatment 11 weeks. The wound is located on the Left,Dorsal T Great. The wound measures 0.1cm length x 0.1cm width x 0.1cm depth; 0.008cm^2 area and 0.001cm^3 volume. There oe is Fat Layer (Subcutaneous Tissue) exposed. There is no tunneling or undermining noted. There is a medium amount of serous drainage noted. The wound margin is flat and intact. There is large (67-100%) red, pink granulation within the wound bed. There is a small (1-33%) amount of necrotic tissue within the wound bed including Adherent Slough. The periwound skin appearance had no abnormalities noted for texture. The periwound skin appearance had no abnormalities noted for color. The periwound skin appearance did not exhibit: Dry/Scaly, Maceration. Periwound temperature was noted as No Abnormality. The periwound has tenderness on palpation. Wound #2 status is Healed - Epithelialized. Original cause of wound was Shear/Friction. The date acquired was: 05/24/2021. The wound has been in treatment 5 weeks. The wound is located on the Left,Medial T [place]. The wound measures 0cm length x 0cm width x 0cm depth; 0cm^2 area and 0cm^3 volume. There is oe no tunneling or undermining noted. There is a none present amount of drainage noted. The wound margin is flat and intact. There is no granulation within the wound bed. There is no necrotic tissue within the wound bed. The periwound skin appearance had no abnormalities noted for texture.  The periwound skin appearance had no abnormalities noted for moisture. The periwound skin appearance had no abnormalities noted for color. Periwound temperature was noted as No Abnormality. The periwound has tenderness on palpation. Wound #5 status is Healed - Epithelialized. Original cause of wound was Gradually Appeared. The date acquired was: 07/13/2022. The wound has been in treatment 5 weeks. The wound is located on the Left T Second. The wound  measures 0cm length x 0cm width x 0cm depth; 0cm^2 area and 0cm^3 volume. oe There is no tunneling or undermining noted. There is a none present amount of drainage noted. The wound margin is flat and intact. There is no granulation within the wound bed. There is no necrotic tissue within the wound bed. The periwound skin appearance had no abnormalities noted for texture. The periwound skin appearance had no abnormalities noted for moisture. The periwound skin appearance had no abnormalities noted for color. Periwound temperature was noted as No Abnormality. The periwound has tenderness on palpation. 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 There was a Selective/Open Wound Non-Viable Tissue Debridement oe with a total area of 0.01 sq cm performed by [name], MD. With the following instrument(s): Curette to remove Non-Viable tissue/material. Material removed includes [facility] after achieving pain control using Lidocaine 4% Topical Solution. No specimens were taken. A time out was conducted at 13:19, prior to the start of the procedure. A  Minimum amount of bleeding was controlled with Pressure. The procedure was tolerated well with a pain level of 0 throughout and a pain level of 0 following the procedure. Post Debridement Measurements: 0.1cm length x 0.1cm width x 0.1cm depth; 0.001cm^3 volume. Character of Wound/Ulcer Post Debridement is improved. Post procedure Diagnosis Wound #1: Same as Pre-Procedure General Notes: Scribed for Dr. Lady Gary by J.Scotton. Plan [name], [name] ([id number]) [id number] Page 7 of 9 Follow-up Appointments: Return Appointment in 1 week. - Dr. Lady Gary - Room 2 Other: - DO NOT USE FOAM BETWEEN TOES - LEAVE COMPRESSION WRAPS ON UNTIL NEXT WEEK, ONLY NEED TO CHANGE TOE DRESSING, PLEASE FOLLOW ORDERS ON WHAT TO USE Anesthetic: (In clinic) Topical Lidocaine 4% applied to wound bed - USED in Clinic Bathing/ Shower/ Hygiene: May shower with protection but do not get wound dressing(s) wet. Protect dressing(s) with water repellant cover (for example, large plastic bag) or a cast cover and may then take shower. Other Bathing/Shower/Hygiene Orders/Instructions: - wash between toes with soap and water and dry well. Edema Control - Lymphedema / SCD / Other: Elevate legs to the level of the heart or above for 30 minutes daily and/or when sitting for 3-4 times a day throughout the day. Non Wound Condition: Other Non Wound Condition Orders/Instructions: - urgo lite or 3 layer compression wrap on right and left lower leg - leave compression wraps on legs until next wound care appointment, only need to change the dressing on the toes (left). Change Left T dressing 3 x week. oes Home Health: Wound #1 Left,Dorsal T Great: oe Other Home Health Orders/Instructions: - [facility]. Change Left toe dressing at least 3 x week. Please weave silver alginate between the left toes. Use Ketoconazole,Triamcinolone cream and Zinc oxide in equal parts to be placed on the left  toes/forefoot. WOUND #1: - T Great Wound Laterality: Dorsal, Left oe Cleanser: Soap and Water Other:3 x week and PRN/30 Days Discharge Instructions: May shower and wash wound with dial antibacterial soap and water prior to dressing change. Cleanser: Wound Cleanser Other:3 x week and PRN/30 Days Discharge Instructions: Cleanse the wound with wound cleanser prior to applying a clean dressing using gauze  sponges, not tissue or cotton balls. Peri-Wound Care: Ketoconazole Cream 2% Other:3 x week and PRN/30 Days Discharge Instructions: Apply Ketoconazole mixed 1:1:1 on and around toes Peri-Wound Care: Triamcinolone 15 (g) Other:3 x week and PRN/30 Days Discharge Instructions: Use triamcinolone 15 (g) as directed Peri-Wound Care: Zinc Oxide Ointment 30g tube Other:3 x week and PRN/30 Days Discharge Instructions: Apply Zinc Oxide to periwound with each dressing change Prim Dressing: Sorbalgon AG Dressing 2x2 (in/in) Other:3 x week and PRN/30 Days ary Discharge Instructions: Weave the silver alginate between the left toes Secondary Dressing: Woven Gauze Sponge, Non-Sterile 4x4 in Other:3 x week and PRN/30 Days Discharge Instructions: can use gauze between toes (if no desired) 08/28/2022: The only remaining open wound in her foot is on the dorsal great toe. The improvement in the rest of the foot is quite dramatic, with no tissue maceration or breakdown. She has been elevating her legs and wearing compression wraps. Edema control is excellent and there has been no drainage from her legs. I used a curette to debride the slough from the great toe wound. We will continue to apply silver alginate to this site, as well as we have it between her toes. We will continue liberal application of zinc oxide mixed with ketoconazole and triamcinolone to her foot. Continue bilateral 3 layer equivalent compression wraps. Continue elevation. Follow-up in 1 week. Electronic Signature(s) Signed: 08/28/2022 2:02:15 PM By:  [name] MD FACS Entered By: [name] on 08/28/2022 14:02:15 -------------------------------------------------------------------------------- HxROS Details Patient Name: Date of Service: [name], [facility] [name] 08/28/2022 12:30 PM Medical Record Number: [id number] Patient Account Number: [id number] Date of Birth/Sex: Treating RN: 29-Jun-1940 (82 y.o. F) Primary Care Provider: [name], [facility] 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 Endocrine Medical History: Negative for: Type I Diabetes; Type II Diabetes Past Medical History NotesNAKYA, YONKO ([id number]) [id number] Page 8 of 9 hypothyroidism Musculoskeletal Medical History: Positive for: 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 Neurologic Medical History: Past Medical History Notes: stroke Immunizations Pneumococcal Vaccine: Received Pneumococcal Vaccination: Yes Received Pneumococcal Vaccination On or After 60th Birthday: Yes Implantable Devices None Hospitalization / Surgery History Type of Hospitalization/Surgery cholecystectomy leg surgery tonsillectomy Family and Social History Unknown History: Yes; Former smoker - smoked when she was young; Marital Status - Widowed; Alcohol Use: Never; Drug Use: No History; Caffeine Use: Daily; Financial Concerns: No; Food, Clothing or Shelter Needs: No; Support System Lacking: No; Transportation Concerns: No Electronic Signature(s) Signed: 08/28/2022 3:59:13 PM By: [name] MD FACS Entered By: [name] on 08/28/2022  13:59:39 -------------------------------------------------------------------------------- SuperBill Details Patient Name: Date of Service: [name], [facility] [name] 08/28/2022 Medical Record Number: [id number] Patient Account Number: [id number] Date of Birth/Sex: Treating RN: 02/08/1941 (82 y.o. 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 atrial fibrillation Z79.01 Long term (current) use of anticoagulants I89.0 Lymphedema, not elsewhere classified Facility Procedures : CPT4 Code: [id number] Description: 97597 - DEBRIDE WOUND 1ST 20 SQ CM OR < ICD-10 Diagnosis Description L97.522  Non-pressure chronic ulcer of other part of left foot with fat layer exposed Modifier: Quantity: 1 Physician Procedures : CPT4 Code Description Modifier Ordaz, [name] ([id number]) [id number] - WC PHYS LEVEL 4 - EST PT 25 ICD-10 Diagnosis Description L97.522 Non-pressure chronic ulcer of other part of left foot with fat layer exposed  I89.0 Lymphedema, not elsewhere classified 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

## 2022-08-29 NOTE — Progress Notes (Signed)
VIKY, CORPE ([id number]) [id number] Page 1 of 9 Visit Report for 08/28/2022 Arrival Information Details Patient Name: Date of Service: [name] RO N 08/28/2022 12:30 PM Medical Record Number: [id number] Patient Account Number: [id number] Date of Birth/Sex: Treating RN: 01/07/1941 (82 y.o. [name] Primary Care Emine Lopata: [name], [facility] Other Clinician: Referring Roshawn Lacina: Treating Kindred Heying/Extender: [name], [facility] Weeks in Treatment: 11 Visit Information History Since Last Visit Added or deleted any medications: No Patient Arrived: Wheel Chair Any new allergies or adverse reactions: No Arrival Time: 12:37 Had a fall or experienced change in No Accompanied By: caregiver activities of daily living that may affect Transfer Assistance: Manual risk of falls: Patient Identification Verified: Yes Signs or symptoms of abuse/neglect since last visito No Patient Requires Transmission-Based Precautions: No Hospitalized since last visit: No Patient Has Alerts: No Implantable device outside of the clinic excluding No cellular tissue based products placed in the center since last visit: Has Dressing in Place as Prescribed: Yes Pain Present Now: Yes Electronic Signature(s) Signed: 08/28/2022 5:32:19 PM By: Karie Schwalbe RN Entered By: Karie Schwalbe on 08/28/2022 12:53:18 -------------------------------------------------------------------------------- Encounter Discharge Information Details Patient Name: Date of Service: [name], [facility] RO N 08/28/2022 12:30 PM Medical Record Number: [id number] Patient Account Number: [id number] Date of Birth/Sex: Treating RN: Mar 09, 1941 (82 y.o. [name] Primary Care Judeth Gilles: [name], [facility] Other Clinician: Referring Keyaria Lawson: Treating Dory Demont/Extender: [name] SHO [name], [facility] Weeks in Treatment: 30 Encounter Discharge Information Items Post Procedure Vitals Discharge  Condition: Stable Temperature (F): 98.6 Ambulatory Status: Wheelchair Pulse (bpm): 94 Discharge Destination: Other (Note Required) Respiratory Rate (breaths/min): 18 Telephoned: No Blood Pressure (mmHg): 123/83 Orders Sent: Yes Transportation: Private Auto Accompanied By: caregiver/friend Schedule Follow-up Appointment: Yes Clinical Summary of Care: Patient Declined Electronic Signature(s) Signed: 08/28/2022 5:32:19 PM By: Karie Schwalbe RN Entered By: Karie Schwalbe on 08/28/2022 17:23:30 -------------------------------------------------------------------------------- Lower Extremity Assessment Details Patient Name: Date of Service: [name] RO N 08/28/2022 12:30 PM Medical Record Number: [id number] Patient Account Number: [id number] Date of Birth/Sex: Treating RN: 08-26-1940 (82 y.o. [name] Primary Care Jimie Kuwahara: [name], [facility] Other Clinician: Referring Patrizia Paule: Treating Daryl Quiros/Extender: [name] SHO KES, [facility] Weeks in Treatment: 11 Edema Assessment Left: [Left: Right] [Right: :] Assessed: [Left: No] [Right: No] Edema: [Left: Ye] [Right: s] Calf Left: Right: Point of Measurement: From Medial Instep 28.5 cm Ankle Left: Right: Point of Measurement: From Medial Instep 22 cm Vascular Assessment Pulses: Dorsalis Pedis Palpable: [Left:Yes] Electronic Signature(s) Signed: 08/28/2022 5:32:19 PM By: Karie Schwalbe RN Entered By: Karie Schwalbe on 08/28/2022 12:54:39 -------------------------------------------------------------------------------- Multi Wound Chart Details Patient Name: Date of Service: [name], [facility] RO N 08/28/2022 12:30 PM Medical Record Number: [id number] Patient Account Number: [id number] Date of Birth/Sex: Treating RN: Jan 04, 1941 (82 y.o. F) Primary Care Kalayah Leske: [name], [facility] Other Clinician: Referring Rekha Hobbins: Treating Daryus Sowash/Extender: [name], [facility] Weeks in Treatment: 11 Vital Signs Height(in):  67 Pulse(bpm): 94 Weight(lbs): 153 Blood Pressure(mmHg): 123/83 Body Mass Index(BMI): 24 Temperature(F): 98.6 Respiratory Rate(breaths/min): 18 [1:Photos:] Left, Dorsal T Great oe Left, Medial T Great oe Left T Second oe Wound Location: Footwear Injury Shear/Friction Gradually Appeared Wounding Event: Abrasion Abrasion Abrasion Primary Etiology: Anemia, Angina, Hypertension, Anemia, Angina, Hypertension, Anemia, Angina, Hypertension, Comorbid History: Vasculitis, Osteoarthritis Vasculitis, Osteoarthritis Vasculitis, Osteoarthritis 05/24/2021 05/24/2021 07/13/2022 Date Acquired: 11 5 5  Weeks of Treatment: Open Healed - Epithelialized Healed - Epithelialized Wound Status: No No No Wound Recurrence: 0.1x0.1x0.1 0x0x0 0x0x0 Measurements L x W x D (cm)  0.008 0 0 A (cm) : rea 0.001 0 0 Volume (cm) : 98.20% 100.00% 100.00% % Reduction in Area: 97.70% 100.00% 100.00% % Reduction in Volume: 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 Classification: Support Structures Support Structures Support Structures Medium None Present None Present Exudate Amount: Serous N/A N/A Exudate Type: amber N/A N/A Exudate Color: Flat and Intact Flat and Intact Flat and Intact Wound Margin: Large (67-100%) None Present (0%) None Present (0%) Granulation Amount: Red, Pink N/A N/A Granulation Quality: Small (1-33%) None Present (0%) None Present (0%) Necrotic Amount: Coomes, Iwalani ([id number]) [phone].pdf Page 3 of 9 Fat Layer (Subcutaneous Tissue): Yes Fascia: No Fascia: No Exposed Structures: Fascia: No Fat Layer (Subcutaneous Tissue): No Fat Layer (Subcutaneous Tissue): No Tendon: No Tendon: No Tendon: No Muscle: No Muscle: No Muscle: No Joint: No Joint: No Joint: No Bone: No Bone: No Bone: No Small (1-33%) Large (67-100%) Large (67-100%) Epithelialization: Debridement - Selective/Open Wound  N/A N/A Debridement: Pre-procedure Verification/Time Out 13:19 N/A N/A Taken: Lidocaine 4% Topical Solution N/A N/A Pain Control: Slough N/A N/A Tissue Debrided: Non-Viable Tissue N/A N/A Level: 0.01 N/A N/A Debridement A (sq cm): rea Curette N/A N/A Instrument: Minimum N/A N/A Bleeding: Pressure N/A N/A Hemostasis A chieved: 0 N/A N/A Procedural Pain: 0 N/A N/A Post Procedural Pain: Procedure was tolerated well N/A N/A Debridement Treatment Response: 0.1x0.1x0.1 N/A N/A Post Debridement Measurements L x W x D (cm) 0.001 N/A N/A Post Debridement Volume: (cm) Scarring: Yes Excoriation: Yes Excoriation: No Periwound Skin Texture: Scarring: No Induration: No Callus: No Crepitus: No Rash: No Scarring: No Maceration: No Maceration: No Maceration: No Periwound Skin Moisture: Dry/Scaly: No Dry/Scaly: No Dry/Scaly: No Rubor: No No Abnormalities Noted Atrophie Blanche: No Periwound Skin Color: Cyanosis: No Ecchymosis: No Erythema: No Hemosiderin Staining: No Mottled: No Pallor: No Rubor: No No Abnormality No Abnormality No Abnormality Temperature: Yes Yes Yes Tenderness on Palpation: Debridement N/A N/A Procedures Performed: Treatment Notes Electronic Signature(s) Signed: 08/28/2022 1:35:22 PM By: [name] MD FACS Entered By: [name] on 08/28/2022 13:35:22 -------------------------------------------------------------------------------- Multi-Disciplinary Care Plan Details Patient Name: Date of Service: [name] [facility] RO N 08/28/2022 12:30 PM Medical Record Number: [id number] Patient Account Number: [id number] Date of Birth/Sex: Treating RN: October 06, 1940 (82 y.o. [name] Primary Care Keishla Oyer: [name], [facility] Other Clinician: Referring Consuello Lassalle: Treating Janelys Glassner/Extender: [name], [facility] Weeks in Treatment: 65 Active Inactive Necrotic Tissue Nursing Diagnoses: Impaired tissue integrity related to  necrotic/devitalized tissue Knowledge deficit related to management of necrotic/devitalized tissue Goals: Necrotic/devitalized tissue will be minimized in the wound bed Date Initiated: 06/12/2022 Target Resolution Date: 10/26/2022 Goal Status: Active Patient/caregiver will verbalize understanding of reason and process for debridement of necrotic tissue Date Initiated: 06/12/2022 Target Resolution Date: 10/26/2022 Goal Status: Active Interventions: BRIGITTE, HOPP ([id number]) [phone].pdf Page 4 of 9 Assess patient pain level pre-, during and post procedure and prior to discharge Provide education on necrotic tissue and debridement process Treatment Activities: Apply topical anesthetic as ordered : 06/12/2022 Notes: Wound/Skin Impairment Nursing Diagnoses: Impaired tissue integrity Knowledge deficit related to ulceration/compromised skin integrity Goals: Patient/caregiver will verbalize understanding of skin care regimen Date Initiated: 06/12/2022 Target Resolution Date: 10/26/2022 Goal Status: Active Interventions: Assess ulceration(s) every visit Treatment Activities: Skin care regimen initiated : 06/12/2022 Topical wound management initiated : 06/12/2022 Notes: Electronic Signature(s) Signed: 08/28/2022 5:32:19 PM By: Karie Schwalbe RN Entered By: Karie Schwalbe on 08/28/2022 17:21:31 -------------------------------------------------------------------------------- Pain Assessment Details Patient Name: Date of Service: [name],  [facility] RO N 08/28/2022 12:30 PM Medical Record Number: [id number] Patient Account Number: [id number] Date of Birth/Sex: Treating RN: [birth year] (82 y.o. [name] Primary Care Tremayne Sheldon: [name], [facility] Other Clinician: Referring Indira Sorenson: Treating Koralee Wedeking/Extender: [name], [facility] Weeks in Treatment: 13 Active Problems Location of Pain Severity and Description of Pain Patient Has Paino Yes Site  Locations Pain Location: Generalized Pain With Dressing Change: No Duration of the Pain. Constant / Intermittento Constant Rate the pain. Current Pain Level: 6 Worst Pain Level: 10 Least Pain Level: 1 Tolerable Pain Level: 1 Character of Pain Describe the Pain: Tender Pain Management and Medication Current Pain Management: Medication: Yes Cold Application: No Rest: Yes Massage: No Activity: No T.E.N.S.: No Bing, Blessings ([id number]) [phone].pdf Page 5 of 9 Heat Application: No Leg drop or elevation: No Is the Current Pain Management Adequate: Adequate How does your wound impact your activities of daily livingo Sleep: No Bathing: No Appetite: No Relationship With Others: No Bladder Continence: No Emotions: No Bowel Continence: No Work: No Toileting: No Drive: No Dressing: No Hobbies: No Electronic Signature(s) Signed: 08/28/2022 5:32:19 PM By: Karie Schwalbe RN Entered By: Karie Schwalbe on 08/28/2022 12:54:33 -------------------------------------------------------------------------------- Patient/Caregiver Education Details Patient Name: Date of Service: [name] RO N 5/7/2024andnbsp12:30 PM Medical Record Number: [id number] Patient Account Number: [id number] Date of Birth/Gender: Treating RN: [birth year] (82 y.o. [name] Primary Care Physician: [name], [facility] Other Clinician: Referring Physician: Treating Physician/Extender: [name], [facility] Weeks in Treatment: 11 Education Assessment Education Provided To: Patient Education Topics Provided Wound/Skin Impairment: Methods: Explain/Verbal Responses: Return demonstration correctly Electronic Signature(s) Signed: 08/28/2022 5:32:19 PM By: Karie Schwalbe RN Entered By: Karie Schwalbe on 08/28/2022 17:21:54 -------------------------------------------------------------------------------- Wound Assessment Details Patient Name: Date of Service: [name] [facility] RO N 08/28/2022 12:30 PM Medical Record Number: [id number] Patient Account Number: [id number] Date of Birth/Sex: Treating RN: 1940/08/14 (82 y.o. [name] Primary Care Karmela Bram: [name], [facility] Other Clinician: Referring Jacqualyn Sedgwick: Treating Wendelin Reader/Extender: [name] SHO KES, [facility] Weeks in Treatment: 11 Wound Status Wound Number: 1 Primary Etiology: Abrasion Wound Location: Left, Dorsal T Great oe Wound Status: Open Wounding Event: Footwear Injury Comorbid History: Anemia, Angina, Hypertension, Vasculitis, Osteoarthritis Date Acquired: 05/24/2021 Weeks Of Treatment: 11 Clustered Wound: No Photos Awtrey, Karne ([id number]) [id number] Page 6 of 9 Wound Measurements Length: (cm) 0.1 Width: (cm) 0.1 Depth: (cm) 0.1 Area: (cm) 0.008 Volume: (cm) 0.001 % Reduction in Area: 98.2% % Reduction in Volume: 97.7% Epithelialization: Small (1-33%) Tunneling: No Undermining: No Wound Description Classification: 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 Support Structures Wound Margin: Flat and Intact Exudate Amount: Medium Exudate Type: Serous Exudate Color: amber Foul Odor After Cleansing: No Slough/Fibrino Yes Wound Bed Granulation Amount: Large (67-100%) Exposed Structure Granulation Quality: Red, Pink Fascia Exposed: No Necrotic Amount: Small (1-33%) Fat Layer (Subcutaneous Tissue) Exposed: Yes Necrotic Quality: Adherent Slough Tendon Exposed: No Muscle Exposed: No Joint Exposed: No Bone Exposed: No Periwound Skin Texture Texture Color No Abnormalities Noted: Yes No Abnormalities Noted: Yes Moisture Temperature / Pain No Abnormalities Noted: No Temperature: No Abnormality Dry / Scaly: No Tenderness on Palpation: Yes Maceration: No Treatment Notes Wound #1 (Toe Great) Wound Laterality: Dorsal, Left Cleanser Soap and Water Discharge Instruction: May shower and wash wound with dial antibacterial soap and water prior to  dressing change. Wound Cleanser Discharge Instruction: Cleanse the wound with wound cleanser prior to applying a clean dressing using gauze sponges, not tissue or cotton balls. Peri-Wound Care Ketoconazole Cream 2% Discharge  Instruction: Apply Ketoconazole mixed 1:1:1 on and around toes Triamcinolone 15 (g) Discharge Instruction: Use triamcinolone 15 (g) as directed Zinc Oxide Ointment 30g tube Discharge Instruction: Apply Zinc Oxide to periwound with each dressing change Topical Primary Dressing Sorbalgon AG Dressing 2x2 (in/in) Discharge Instruction: Weave the silver alginate between the left toes Secondary Dressing Woven Gauze Sponge, Non-Sterile 4x4 in Discharge Instruction: can use gauze between toes (if no desired) Mennella, Aliveah ([id number]) [phone].pdf Page 7 of 9 Secured With Compression Wrap Compression Stockings Facilities manager) Signed: 08/28/2022 5:32:19 PM By: Karie Schwalbe RN Entered By: Karie Schwalbe on 08/28/2022 13:06:25 -------------------------------------------------------------------------------- Wound Assessment Details Patient Name: Date of Service: [name] [facility] RO N 08/28/2022 12:30 PM Medical Record Number: [id number] Patient Account Number: [id number] Date of Birth/Sex: Treating RN: 06-30-1940 (81 y.o. [name] Primary Care Mikaili Flippin: [name], [facility] Other Clinician: Referring Bianna Haran: Treating Lazara Grieser/Extender: [name] SHO [name], [facility] Weeks in Treatment: 11 Wound Status Wound Number: 2 Primary Etiology: Abrasion Wound Location: Left, Medial T Great oe Wound Status: Healed - Epithelialized Wounding Event: Shear/Friction Comorbid History: Anemia, Angina, Hypertension, Vasculitis, Osteoarthritis Date Acquired: 05/24/2021 Weeks Of Treatment: 5 Clustered Wound: No Photos Wound Measurements Length: (cm) Width: (cm) Depth: (cm) Area: (cm) Volume: (cm) 0 % Reduction in Area: 100% 0 %  Reduction in Volume: 100% 0 Epithelialization: Large (67-100%) 0 Tunneling: No 0 Undermining: No Wound Description Classification: 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 Support Structures Wound Margin: Flat and Intact Exudate Amount: None Present Foul Odor After Cleansing: No Slough/Fibrino No Wound Bed Granulation Amount: None Present (0%) Exposed Structure Necrotic Amount: None Present (0%) Fascia Exposed: No Fat Layer (Subcutaneous Tissue) Exposed: No Tendon Exposed: No Muscle Exposed: No Joint Exposed: No Bone Exposed: No Periwound Skin Texture Texture Color No Abnormalities Noted: Yes No Abnormalities Noted: Yes Moisture Temperature / Pain No Abnormalities Noted: Yes Temperature: No Abnormality Tenderness on Palpation: Yes Yaffe, Jess ([id number]) [phone].pdf Page 8 of 9 Electronic Signature(s) Signed: 08/28/2022 5:32:19 PM By: Karie Schwalbe RN Entered By: Karie Schwalbe on 08/28/2022 13:24:24 -------------------------------------------------------------------------------- Wound Assessment Details Patient Name: Date of Service: [name] [facility] RO N 08/28/2022 12:30 PM Medical Record Number: [id number] Patient Account Number: [id number] Date of Birth/Sex: Treating RN: 12/19/1940 (82 y.o. [name] Primary Care Eryck Negron: [name], [facility] Other Clinician: Referring Camaryn Lumbert: Treating Wray Goehring/Extender: [name] SHO [name], [facility] Weeks in Treatment: 11 Wound Status Wound Number: 5 Primary Etiology: Abrasion Wound Location: Left T Second oe Wound Status: Healed - Epithelialized Wounding Event: Gradually Appeared Comorbid History: Anemia, Angina, Hypertension, Vasculitis, Osteoarthritis Date Acquired: 07/13/2022 Weeks Of Treatment: 5 Clustered Wound: No Photos Wound Measurements Length: (cm) Width: (cm) Depth: (cm) Area: (cm) Volume: (cm) 0 % Reduction in Area: 100% 0 % Reduction in Volume: 100% 0 Epithelialization: Large  (67-100%) 0 Tunneling: No 0 Undermining: No Wound Description Classification: 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 Support Structures Wound Margin: Flat and Intact Exudate Amount: None Present Foul Odor After Cleansing: No Slough/Fibrino No Wound Bed Granulation Amount: None Present (0%) Exposed Structure Necrotic Amount: None Present (0%) Fascia Exposed: No Fat Layer (Subcutaneous Tissue) Exposed: No Tendon Exposed: No Muscle Exposed: No Joint Exposed: No Bone Exposed: No Periwound Skin Texture Texture Color No Abnormalities Noted: Yes No Abnormalities Noted: Yes Moisture Temperature / Pain No Abnormalities Noted: Yes Temperature: No Abnormality Tenderness on Palpation: Yes Electronic Signature(s) Signed: 08/28/2022 5:32:19 PM By: Karie Schwalbe RN Entered By: Karie Schwalbe on 08/28/2022 13:25:05 Tschida, Lakeesha ([id number]) [id number].pdf Page 9 of 9 -------------------------------------------------------------------------------- Vitals Details Patient  Name: Date of Service: [name] 08/28/2022 12:30 PM Medical Record Number: [id number] Patient Account Number: [id number] Date of Birth/Sex: Treating RN: 1941/02/28 (82 y.o. [name] Primary Care Samora Jernberg: [name], [facility] Other Clinician: Referring Jael Waldorf: Treating Khayden Herzberg/Extender: [name] SHO [name], [facility] Weeks in Treatment: 11 Vital Signs Time Taken: 12:38 Temperature (F): 98.6 Height (in): 67 Pulse (bpm): 94 Weight (lbs): 153 Respiratory Rate (breaths/min): 18 Body Mass Index (BMI): 24 Blood Pressure (mmHg): 123/83 Reference Range: 80 - 120 mg / dl Electronic Signature(s) Signed: 08/28/2022 5:32:19 PM By: Karie Schwalbe RN Entered By: Karie Schwalbe on 08/28/2022 12:53:46

## 2022-09-05 ENCOUNTER — Encounter (HOSPITAL_BASED_OUTPATIENT_CLINIC_OR_DEPARTMENT_OTHER): Payer: Medicare Other | Admitting: General Surgery

## 2022-09-05 DIAGNOSIS — L97522 Non-pressure chronic ulcer of other part of left foot with fat layer exposed: Secondary | ICD-10-CM | POA: Diagnosis not present

## 2022-09-20 ENCOUNTER — Encounter (HOSPITAL_BASED_OUTPATIENT_CLINIC_OR_DEPARTMENT_OTHER): Payer: Medicare Other | Admitting: General Surgery

## 2022-09-20 DIAGNOSIS — L97522 Non-pressure chronic ulcer of other part of left foot with fat layer exposed: Secondary | ICD-10-CM | POA: Diagnosis not present

## 2022-09-25 ENCOUNTER — Encounter (HOSPITAL_BASED_OUTPATIENT_CLINIC_OR_DEPARTMENT_OTHER): Payer: Medicare Other | Attending: General Surgery | Admitting: General Surgery

## 2022-09-25 DIAGNOSIS — M1990 Unspecified osteoarthritis, unspecified site: Secondary | ICD-10-CM | POA: Diagnosis not present

## 2022-09-25 DIAGNOSIS — Z7901 Long term (current) use of anticoagulants: Secondary | ICD-10-CM | POA: Insufficient documentation

## 2022-09-25 DIAGNOSIS — L97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 without evidence of necrosis: Secondary | ICD-10-CM | POA: Insufficient documentation

## 2022-09-25 DIAGNOSIS — M341 CR(E)ST syndrome: Secondary | ICD-10-CM | POA: Diagnosis not present

## 2022-09-25 DIAGNOSIS — Z8673 Personal history of transient ischemic attack (TIA), and cerebral infarction without residual deficits: Secondary | ICD-10-CM | POA: Insufficient documentation

## 2022-09-25 DIAGNOSIS — I10 Essential (primary) hypertension: Secondary | ICD-10-CM | POA: Insufficient documentation

## 2022-09-25 DIAGNOSIS — I48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 atrial fibrillation: Secondary | ICD-10-CM | POA: Insufficient documentation

## 2022-09-25 DIAGNOSIS — I890 Lymphedema, not elsewhere classified: Secondary | ICD-10-CM | POA: Insufficient documentation

## 2022-09-25 DIAGNOSIS — L97429 Non-pressure chronic ulcer of left heel and midfoot with unspecified severity: Secondary | ICD-10-CM | POA: Insufficient documentation

## 2022-10-03 ENCOUNTER — Encounter (HOSPITAL_BASED_OUTPATIENT_CLINIC_OR_DEPARTMENT_OTHER): Payer: Medicare Other | Admitting: General Surgery

## 2022-10-03 DIAGNOSIS — I48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 atrial fibrillation: Secondary | ICD-10-CM | POA: Diagnosis not present

## 2022-10-04 NOTE — Progress Notes (Signed)
KALAIYA, MARKUS ([id number]) [id number] Page 1 of 12 Visit Report for 10/03/2022 Chief Complaint Document Details Patient Name: Date of Service: [name] 10/03/2022 10:00 A M Medical Record Number: [id number] Patient Account Number: [id number] Date of Birth/Sex: Treating RN: 04/23/1941 (82 y.o. F) Primary Care Provider: [name], [facility] Other Clinician: Referring Provider: Treating Provider/Extender: [name], [facility] Weeks in Treatment: 16 Information Obtained from: Patient Chief Complaint Patient seen for complaints of Non-Healing Wound. Electronic Signature(s) Signed: 10/03/2022 10:53:00 AM By: [name] MD FACS Entered By: [name] on 10/03/2022 10:53:00 -------------------------------------------------------------------------------- Debridement Details Patient Name: Date of Service: [name], [facility] RO N 10/03/2022 10:00 A M Medical Record Number: [id number] Patient Account Number: [id number] Date of Birth/Sex: Treating RN: 09-26-1940 (82 y.o. [name] Primary Care Provider: [name], [facility] Other Clinician: Referring Provider: Treating Provider/Extender: [name], [facility] Weeks in Treatment: 16 Debridement Performed for Assessment: Wound #1 Left,Dorsal T Great oe Performed By: Physician [name], MD Debridement Type: Debridement Level of Consciousness (Pre-procedure): Awake and Alert Pre-procedure Verification/Time Out Yes - 10:30 Taken: Start Time: 10:30 Pain Control: Lidocaine 4% T opical Solution Percent of Wound Bed Debrided: 100% T Area Debrided (cm): otal 0.08 Tissue and other material debrided: Non-Viable, Slough, Slough Level: Non-Viable Tissue Debridement Description: Selective/Open Wound Instrument: Curette Bleeding: Minimum Hemostasis Achieved: Pressure Response to Treatment: Procedure was tolerated well Level of Consciousness (Post- Awake and  Alert procedure): Post Debridement Measurements of Total Wound Length: (cm) 0.2 Width: (cm) 0.5 Depth: (cm) 0.1 Volume: (cm) 0.008 Character of Wound/Ulcer Post Debridement: Improved Post Procedure Diagnosis Same as Pre-procedure Notes scribed for Dr. Lady Gary by [name], RN Electronic Signature(s) Signed: 10/03/2022 12:23:37 PM By: [name] MD FACS Signed: 10/03/2022 3:47:10 PM By: [name] Entered By: [name] on 10/03/2022 10:31:39 Clyatt, Ricketta ([id number]) [id number] Page 2 of 12 -------------------------------------------------------------------------------- Debridement Details Patient Name: Date of Service: [name] 10/03/2022 10:00 A M Medical Record Number: [id number] Patient Account Number: [id number] Date of Birth/Sex: Treating RN: November 25, 1940 (82 y.o. [name] Primary Care Provider: [name], [facility] Other Clinician: Referring Provider: Treating Provider/Extender: [name], [facility] Weeks in Treatment: 16 Debridement Performed for Assessment: Wound #7 Left,Plantar T Fourth oe Performed By: Physician [name], MD Debridement Type: Debridement Level of Consciousness (Pre-procedure): Awake and Alert Pre-procedure Verification/Time Out Yes - 10:30 Taken: Start Time: 10:30 Pain Control: Lidocaine 4% T opical Solution Percent of Wound Bed Debrided: 100% T Area Debrided (cm): otal 0.07 Tissue and other material debrided: Non-Viable, Slough, Slough Level: Non-Viable Tissue Debridement Description: Selective/Open Wound Instrument: Curette Bleeding: Minimum Hemostasis Achieved: Pressure Response to Treatment: Procedure was tolerated well Level of Consciousness (Post- Awake and Alert procedure): Post Debridement Measurements of Total Wound Length: (cm) 0.3 Width: (cm) 0.3 Depth: (cm) 0.1 Volume: (cm) 0.007 Character of Wound/Ulcer Post Debridement:  Improved Post Procedure Diagnosis Same as Pre-procedure Notes scribed for Dr. Lady Gary by [name], RN Electronic Signature(s) Signed: 10/03/2022 12:23:37 PM By: [name] MD FACS Signed: 10/03/2022 3:47:10 PM By: [name] Entered By: [name] on 10/03/2022 10:33:35 -------------------------------------------------------------------------------- HPI Details Patient Name: Date of Service: [name], [facility] RO N 10/03/2022 10:00 A M Medical Record Number: [id number] Patient Account Number: [id number] Date of Birth/Sex: Treating RN: January 28, 1941 (82 y.o. F) Primary Care Provider: [name], [facility] Other Clinician: Referring Provider: Treating Provider/Extender: [name], [facility] Weeks in Treatment: 16 History of Present Illness HPI Description: ADMISSION 06/12/2022 This is  an 82 year old woman with a history of CVA, crest syndrome, atrial fibrillation, rocker-bottom foot deformity. 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 She has been followed by podiatry for this and I am not entirely clear as to how she came to be referred here. She resides in an assisted living facility. It is not clear what they have been putting on her wound, but on intake, she was noted to have denuded skin on her medial third toe, as well as ulcers on her dorsal great toe and lateral great toe. There is slough accumulation on both of the toe ulcers. There was an odor noted at intake, but after her foot was washed, the odor dissipated. Her toes are folded on top of each other creating areas of abrasion and pockets for moisture collection, which seems to be the primary cause of her ulceration. 06/20/2022: The skin between her toes and on the ball of her foot is completely macerated. There has been more tissue breakdown. The wound on her great toe has some slough accumulation. 06/27/2022: No change to her wounds today. There has been no  further deterioration, but no significant improvement. She was both hypotensive and bradycardic Yusuf, [place] ([id number]) [id number].pdf Page 3 of 12 on intake. 07/11/2022: Today, her foot is completely macerated. She reports that the wound care nurse actually soaked her foot and then applied foam, despite our specific orders to not use foam at all. She also is draining serous fluid from both legs and has 2+ pitting edema. She is on furosemide 20 mg twice a day. 07/19/2022: Once again, her foot is completely macerated. There has been further tissue breakdown to the second and third toes and she now has ulcers on the distal ends. The wounds on her medial and lateral great toe have thick slough accumulation. Apparently Xeroform was found between her toes on intake. Edema control is improved, but still not perfect. No overt drainage from her legs appreciated on exam today. 07/27/2022: She has less tissue maceration today. Edema control in her bilateral lower extremities is improved. Still with slough accumulation on all open wound surfaces. 08/08/2022: Significant improvement this week. She has very little tissue maceration and according to her aide, there has been very little drainage from her legs. There is some slough accumulation on the medial great toe ulcer. Edema control is improved bilaterally. She is spending more time in her bed with her legs elevated and less in her wheelchair with her legs in a dependent position. 08/20/2022: The edema in her legs is now well-controlled with the use of the zinc Unna boots. Unfortunately, this seems to have resulted in more drainage coming from the open areas of her feet. They are a bit macerated but there has not been as much tissue breakdown secondary to moisture as we have seen on previous visits. 08/28/2022: The only remaining open wound in her foot is on the dorsal great toe. The improvement in the rest of the foot is quite dramatic,  with no tissue maceration or breakdown. She has been elevating her legs and wearing compression wraps. Edema control is excellent and there has been no drainage from her legs. 09/05/2022: Unfortunately, the distal half of her dorsal foot has broken down and has a layer of slough on the surface. This appears to be secondary to moisture accumulation. Her dressing was completely saturated. 09/20/2022: There has been further deterioration of her foot. The dressing that was applied was bizarre and involved  Coflex foam wrapped around her foot to the ankle and then Kerlix and Coban over that to the knee. She fortunately did have silver alginate between her toes but the tissue breakdown from moisture is extensive. 09/25/2022: There has been massive improvement in her foot since last week. The maceration has decreased, edema control is markedly better, and the wounds are showing evidence of healing, as opposed to worsening. 10/03/2022: The wound on her great toe is much smaller with minimal slough accumulation. The dorsal foot is also improving. She has a new ulcer on the plantar surface of her left fourth toe, however, and bone is exposed. Edema control and tissue maceration continues to be significantly better now that we are doing all of the dressing care. Electronic Signature(s) Signed: 10/03/2022 10:53:58 AM By: [name] MD FACS Entered By: [name] on 10/03/2022 10:53:58 -------------------------------------------------------------------------------- Physical Exam Details Patient Name: Date of Service: [name] [facility] RO N 10/03/2022 10:00 A M Medical Record Number: [id number] Patient Account Number: [id number] Date of Birth/Sex: Treating RN: 11/23/1940 (82 y.o. F) Primary Care Provider: [name], [facility] Other Clinician: Referring Provider: Treating Provider/Extender: [name] SHO KES, [facility] Weeks in Treatment: 16 Constitutional . . . . no acute distress. Respiratory Normal  work of breathing on room air. Notes 10/03/2022: The wound on her great toe is much smaller with minimal slough accumulation. The dorsal foot is also improving. She has a new ulcer on the plantar surface of her left fourth toe, however, and bone is exposed. Edema control and tissue maceration continues to be significantly better. Electronic Signature(s) Signed: 10/03/2022 10:54:33 AM By: [name] MD FACS Entered By: [name] on 10/03/2022 10:54:33 -------------------------------------------------------------------------------- Physician Orders Details Patient Name: Date of Service: [name], [facility] RO N 10/03/2022 10:00 A M Medical Record Number: [id number] Patient Account Number: [id number] Date of Birth/Sex: Treating RN: [birth year] (82 y.o. 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 - Dr. Lady Gary - room 2 DO NOT CHANGE DRESSING - Return A Anesthetic (In clinic) Topical Lidocaine 4% applied to wound bed - USED in Clinic Bathing/ Shower/ Hygiene May shower with protection but do not get wound dressing(s) wet. Protect dressing(s) with water repellant cover (for example, large  plastic bag) or a cast cover and may then take shower. Edema Control - Lymphedema / SCD / Other Elevate legs to the level of the heart or above for 30 minutes daily and/or when sitting for 3-4 times a day throughout the day. Avoid standing for long periods of time. Non Wound Condition Right Lower Extremity Other Non Wound Condition Orders/Instructions: - urgo lite or 3 layer compression wrap on right and left lower leg Home Health Wound #1 Left,Dorsal T Great oe Other Home Health Orders/Instructions: - [facility]. Wound Treatment Wound #1 - T Great oe Wound Laterality: Dorsal, Left Cleanser: Soap and Water 1 x Per Week/30 Days  Discharge Instructions: May shower and wash wound with dial antibacterial soap and water prior to dressing change. Cleanser: Wound Cleanser 1 x Per Week/30 Days Discharge Instructions: Cleanse the wound with wound cleanser prior to applying a clean dressing using gauze sponges, not tissue or cotton balls. Peri-Wound Care: Sween Lotion (Moisturizing lotion) 1 x Per Week/30 Days Discharge Instructions: Apply moisturizing lotion as directed Topical: Ketoconazole Cream 2% 1 x Per Week/30 Days Discharge Instructions: Apply Ketoconazole as directed Topical: Triamcinolone 1 x Per Week/30 Days Discharge Instructions: Apply Triamcinolone as directed Topical: zinc 1 x Per Week/30 Days Prim Dressing: Maxorb Extra Ag+ Alginate Dressing, 4x4.75 (in/in) 1 x Per Week/30 Days ary Discharge Instructions: Apply to wound bed as instructed Secondary Dressing: ABD Pad, 8x10 1 x Per Week/30 Days Discharge Instructions: Apply over primary dressing as directed. Secondary Dressing: Drawtex 4x4 in 1 x Per Week/30 Days Discharge Instructions: Apply over primary dressing as directed. Secondary Dressing: Woven Gauze Sponge, Non-Sterile 4x4 in 1 x Per Week/30 Days Discharge Instructions: can use gauze between toes (if no desired) Secondary Dressing: Zetuvit Plus 4x8 in 1 x Per  Week/30 Days Discharge Instructions: Apply over primary dressing as directed. Compression Wrap: Urgo K2 Lite, (equivalent to a 3 layer) two layer compression system, regular 1 x Per Week/30 Days Discharge Instructions: Apply Urgo K2 Lite as directed (alternative to 3 layer compression). KENDYLE, PERIN ([id number]) [id number] Page 5 of 12 Wound #6 - Foot Wound Laterality: Dorsal, Left Cleanser: Soap and Water 1 x Per Week/30 Days Discharge Instructions: May shower and wash wound with dial antibacterial soap and water prior to dressing change. Cleanser: Wound Cleanser 1 x Per Week/30 Days Discharge Instructions: Cleanse the wound with wound cleanser prior to applying a clean dressing using gauze sponges, not tissue or cotton balls. Peri-Wound Care: Sween Lotion (Moisturizing lotion) 1 x Per Week/30 Days Discharge Instructions: Apply moisturizing lotion as directed Topical: Ketoconazole Cream 2% 1 x Per Week/30 Days Discharge Instructions: Apply Ketoconazole as directed Topical: Triamcinolone 1 x Per Week/30 Days Discharge Instructions: Apply Triamcinolone as directed Topical: zinc 1 x Per Week/30 Days Prim Dressing: Maxorb Extra Ag+ Alginate Dressing, 4x4.75 (in/in) 1 x Per Week/30 Days ary Discharge Instructions: Apply to wound bed as instructed Secondary Dressing: ABD Pad, 8x10 1 x Per Week/30 Days Discharge Instructions: Apply over primary dressing as directed. Secondary Dressing: Drawtex 4x4 in 1 x Per Week/30 Days Discharge Instructions: Apply over primary dressing as directed. Secondary Dressing: Woven Gauze Sponge, Non-Sterile 4x4 in 1 x Per Week/30 Days Discharge Instructions: can use gauze between toes (if no desired) Secondary Dressing: Zetuvit Plus 4x8 in 1 x Per Week/30 Days Discharge Instructions: Apply over primary dressing as directed. Compression Wrap: Urgo K2 Lite, (equivalent to a 3 layer) two layer compression system, regular 1 x Per Week/30  Days Discharge Instructions: Apply Urgo K2 Lite as directed (alternative to 3 layer compression). Wound #7 - T Fourth oe Wound Laterality: Plantar, Left Cleanser: Soap and Water 1 x Per Week/30 Days Discharge Instructions: May shower and wash wound with dial antibacterial soap and water prior to dressing change. Cleanser: Wound Cleanser 1 x Per Week/30 Days Discharge Instructions: Cleanse the wound with wound cleanser prior to applying a clean dressing using gauze sponges, not tissue or cotton balls. Peri-Wound Care: Sween Lotion (Moisturizing lotion) 1 x Per Week/30 Days Discharge Instructions: Apply moisturizing lotion as directed Topical: Ketoconazole Cream 2% 1 x Per Week/30 Days Discharge Instructions: Apply Ketoconazole as directed Topical: Triamcinolone 1 x Per Week/30  Days Discharge Instructions: Apply Triamcinolone as directed Topical: zinc 1 x Per Week/30 Days Prim Dressing: Endoform 2x2 in 1 x Per Week/30 Days ary Discharge Instructions: Moisten with saline Secondary Dressing: ABD Pad, 8x10 1 x Per Week/30 Days Discharge Instructions: Apply over primary dressing as directed. Secondary Dressing: Drawtex 4x4 in 1 x Per Week/30 Days Discharge Instructions: Apply over primary dressing as directed. Secondary Dressing: Woven Gauze Sponge, Non-Sterile 4x4 in 1 x Per Week/30 Days Discharge Instructions: can use gauze between toes (if no desired) Secondary Dressing: Zetuvit Plus 4x8 in 1 x Per Week/30 Days Discharge Instructions: Apply over primary dressing as directed. Compression Wrap: Urgo K2 Lite, (equivalent to a 3 layer) two layer compression system, regular 1 x Per Week/30 Days Discharge Instructions: Apply Urgo K2 Lite as directed (alternative to 3 layer compression). Patient Medications llergies: Sulfa (Sulfonamide Antibiotics) A Biby, Derek ([id number]) [id number].pdf Page 6 of 12 Notifications Medication Indication Start  End 10/03/2022 lidocaine DOSE topical 4 % cream - cream topical Electronic Signature(s) Signed: 10/03/2022 12:23:37 PM By: [name] MD FACS Entered By: [name] on 10/03/2022 10:56:33 -------------------------------------------------------------------------------- Problem List Details Patient Name: Date of Service: [name], [facility] RO N 10/03/2022 10:00 A M Medical Record Number: [id number] Patient Account Number: [id number] Date of Birth/Sex: Treating RN: [birth year] (82 y.o. 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 06/12/2022 No Yes 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 06/12/2022 No Yes 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 atrial fibrillation 06/12/2022 No Yes Z79.01 Long term (current) use of anticoagulants 06/12/2022 No Yes I89.0 Lymphedema, not elsewhere classified 08/20/2022 No Yes Inactive Problems ICD-10 Code Description Active Date Inactive Date L97.521 Non-pressure chronic ulcer of other part of left foot limited to breakdown of skin 06/12/2022 06/12/2022 Resolved Problems Electronic Signature(s) Signed: 10/03/2022 10:52:36 AM By: [name] MD FACS [place], [place] [id number] By: [name] MD FACS [id number].pdf Page 7 of 12 Signed: 10/03/2022 10:52:36 Entered By: [name] on 10/03/2022 10:52:36 -------------------------------------------------------------------------------- Progress Note  Details Patient Name: Date of Service: [name] 10/03/2022 10:00 A M Medical Record Number: [id number] Patient Account Number: [id number] Date of Birth/Sex: Treating RN: [birth year] (82 y.o. F) Primary Care Provider: [name], [facility] Other Clinician: Referring Provider: Treating Provider/Extender: [name], [facility] Weeks in Treatment: 16 Subjective Chief Complaint Information obtained from Patient Patient seen for complaints of Non-Healing Wound. History of Present Illness (HPI) ADMISSION 06/12/2022 This is an 82 year old woman with a history of CVA, crest syndrome, atrial fibrillation, rocker-bottom foot deformity. 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 She has been followed by podiatry for this and I am not entirely clear as to how she came to be referred here. She resides in an assisted living facility. It is not clear what they have been putting on her wound, but on intake, she was noted to have denuded skin on her medial third toe, as well as ulcers on her dorsal great toe and lateral great toe. There is slough accumulation on both of the toe ulcers. There was  an odor noted at intake, but after her foot was washed, the odor dissipated. Her toes are folded on top of each other creating areas of abrasion and pockets for moisture collection, which seems to be the primary cause of her ulceration. 06/20/2022: The skin between her toes and on the ball of her foot is completely macerated. There has been more tissue breakdown. The wound on her great toe has some slough accumulation. 06/27/2022: No change to her wounds today. There has been no further deterioration, but no significant improvement. She was both hypotensive and bradycardic on intake. 07/11/2022: Today, her foot is completely macerated. She reports that the wound care nurse actually soaked her foot and then applied foam, despite our specific orders to not use foam  at all. She also is draining serous fluid from both legs and has 2+ pitting edema. She is on furosemide 20 mg twice a day. 07/19/2022: Once again, her foot is completely macerated. There has been further tissue breakdown to the second and third toes and she now has ulcers on the distal ends. The wounds on her medial and lateral great toe have thick slough accumulation. Apparently Xeroform was found between her toes on intake. Edema control is improved, but still not perfect. No overt drainage from her legs appreciated on exam today. 07/27/2022: She has less tissue maceration today. Edema control in her bilateral lower extremities is improved. Still with slough accumulation on all open wound surfaces. 08/08/2022: Significant improvement this week. She has very little tissue maceration and according to her aide, there has been very little drainage from her legs. There is some slough accumulation on the medial great toe ulcer. Edema control is improved bilaterally. She is spending more time in her bed with her legs elevated and less in her wheelchair with her legs in a dependent position. 08/20/2022: The edema in her legs is now well-controlled with the use of the zinc Unna boots. Unfortunately, this seems to have resulted in more drainage coming from the open areas of her feet. They are a bit macerated but there has not been as much tissue breakdown secondary to moisture as we have seen on previous visits. 08/28/2022: The only remaining open wound in her foot is on the dorsal great toe. The improvement in the rest of the foot is quite dramatic, with no tissue maceration or breakdown. She has been elevating her legs and wearing compression wraps. Edema control is excellent and there has been no drainage from her legs. 09/05/2022: Unfortunately, the distal half of her dorsal foot has broken down and has a layer of slough on the surface. This appears to be secondary to moisture accumulation. Her dressing was  completely saturated. 09/20/2022: There has been further deterioration of her foot. The dressing that was applied was bizarre and involved Coflex foam wrapped around her foot to the ankle and then Kerlix and Coban over that to the knee. She fortunately did have silver alginate between her toes but the tissue breakdown from moisture is extensive. 09/25/2022: There has been massive improvement in her foot since last week. The maceration has decreased, edema control is markedly better, and the wounds are showing evidence of healing, as opposed to worsening. 10/03/2022: The wound on her great toe is much smaller with minimal slough accumulation. The dorsal foot is also improving. She has a new ulcer on the plantar surface of her left fourth toe, however, and bone is exposed. Edema control and tissue maceration continues to be significantly better now that  we are doing all of the dressing care. Patient History Information obtained from Patient. Family History Unknown History. Social History Former smoker - smoked when she was young, Marital Status - Widowed, Alcohol Use - Never, Drug Use - No History, Caffeine Use - Daily. Medical History Hematologic/Lymphatic Patient has history of Anemia Cardiovascular Holzer, Sylvester ([id number]) [id number] Page 8 of 12 Patient has history of Angina - a-fib, Hypertension, Vasculitis Endocrine Denies history of Type I Diabetes, Type II Diabetes Musculoskeletal Patient has history of Osteoarthritis Hospitalization/Surgery History - cholecystectomy. - leg surgery. - tonsillectomy. 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 Vitals Time Taken: 10:17 AM, Height: 67 in, Weight: 153 lbs, BMI: 24, Temperature: 97.7 F, Pulse: 97 bpm, Respiratory Rate: 18 breaths/min, Blood  Pressure: 115/79 mmHg. Respiratory Normal work of breathing on room air. General Notes: 10/03/2022: The wound on her great toe is much smaller with minimal slough accumulation. The dorsal foot is also improving. She has a new ulcer on the plantar surface of her left fourth toe, however, and bone is exposed. Edema control and tissue maceration continues to be significantly better. Integumentary (Hair, Skin) Wound #1 status is Open. Original cause of wound was Footwear Injury. The date acquired was: 05/24/2021. The wound has been in treatment 16 weeks. The wound is located on the Left,Dorsal T Great. The wound measures 0.2cm length x 0.5cm width x 0.1cm depth; 0.079cm^2 area and 0.008cm^3 volume. There oe is Fat Layer (Subcutaneous Tissue) exposed. There is no tunneling or undermining noted. There is a medium amount of serous drainage noted. The wound margin is flat and intact. There is medium (34-66%) red granulation within the wound bed. There is a medium (34-66%) amount of necrotic tissue within the wound bed including Adherent Slough. The periwound skin appearance had no abnormalities noted for texture. The periwound skin appearance had no abnormalities noted for color. The periwound skin appearance did not exhibit: Dry/Scaly, Maceration. Periwound temperature was noted as No Abnormality. The periwound has tenderness on palpation. Wound #6 status is Open. Original cause of wound was Gradually Appeared. The date acquired was: 09/05/2022. The wound has been in treatment 4 weeks. The wound is located on the Left,Dorsal Foot. The wound measures 3.2cm length x 4cm width x 0.1cm depth; 10.053cm^2 area and 1.005cm^3 volume. There is Fat Layer (Subcutaneous Tissue) exposed. There is no tunneling or undermining noted. There is a large amount of serous drainage noted. The wound margin is distinct with the outline attached to the wound base. There is small (1-33%) red granulation within the wound bed. There is  a large (67-100%) amount of necrotic tissue within the wound bed including Adherent Slough. The periwound skin appearance exhibited: Excoriation, Maceration, Rubor. Periwound temperature was noted as No Abnormality. Wound #7 status is Open. Original cause of wound was Gradually Appeared. The date acquired was: 10/03/2022. The wound is located on the Left,Plantar T oe Fourth. The wound measures 0.3cm length x 0.3cm width x 0.1cm depth; 0.071cm^2 area and 0.007cm^3 volume. There is bone and Fat Layer (Subcutaneous Tissue) exposed. There is no tunneling or undermining noted. There is a medium amount of serosanguineous drainage noted. The wound margin is distinct with the outline attached to the wound base. There is large (67-100%) red granulation within the wound bed. There is a small (1-33%) amount of necrotic tissue within the wound bed including Adherent Slough. The periwound skin appearance had no abnormalities noted for  texture. The periwound skin appearance had no abnormalities noted for moisture. The periwound skin appearance exhibited: Rubor. Periwound temperature was noted as No Abnormality. 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 There was a Selective/Open Wound Non-Viable Tissue Debridement oe with a total area of 0.08 sq cm performed by [name], MD. With the following instrument(s):  Curette to remove Non-Viable tissue/material. Material removed includes [facility] after achieving pain control using Lidocaine 4% Topical Solution. No specimens were taken. A time out was conducted at 10:30, prior to the start of the procedure. A Minimum amount of bleeding was controlled with Pressure. The procedure was tolerated well. Post Debridement Measurements: 0.2cm length x 0.5cm width x 0.1cm depth; 0.008cm^3 volume. Character of Wound/Ulcer Post Debridement is improved. Post procedure Diagnosis Wound #1: Same as Pre-Procedure General Notes: scribed for Dr. Lady Gary by [name], RN. Wound #7 Pre-procedure diagnosis of Wound #7 is an Atypical located on the Left,Plantar T Fourth . There was a Selective/Open Wound Non-Viable Tissue oe Debridement with a total area of 0.07 sq cm performed by [name], MD. With the following instrument(s): Curette to remove Non-Viable tissue/material. Material removed includes [facility] after achieving pain control using Lidocaine 4% Topical Solution. No specimens were taken. A time out was conducted at 10:30, prior to the start of the procedure. A Minimum amount of bleeding was controlled with Pressure. The procedure was tolerated well. Post Debridement Measurements: 0.3cm length x 0.3cm width x 0.1cm depth; 0.007cm^3 volume. Character of Wound/Ulcer Post Debridement is improved. Post procedure Diagnosis Wound #7: Same as Pre-Procedure General Notes: scribed for Dr. Lady Gary by [name], RN. Wound #6 Pre-procedure diagnosis of Wound #6 is a Lymphedema located on the Left,Dorsal Foot . There was a Double Layer Compression Therapy Procedure by [name], RN. Post procedure Diagnosis Wound #6: Same as Pre-Procedure Plan Follow-up Appointments: Return Appointment in 1 week. - Dr. Lady Gary - room 2 DO NOT CHANGE DRESSING - Anesthetic: (In clinic) Topical Lidocaine 4% applied to wound bed - USED in Clinic Bathing/ Shower/ Hygiene: May  shower with protection but do not get wound dressing(s) wet. Protect dressing(s) with water repellant cover (for example, large plastic bag) or a cast cover and may then take shower. Edema Control - Lymphedema / SCD / Other: Elevate legs to the level of the heart or above for 30 minutes daily and/or when sitting for 3-4 times a day throughout the day. Avoid standing for long periods of time. Non Wound Condition: Other Non Wound Condition Orders/Instructions: - urgo lite or 3 layer compression wrap on right and left lower leg Home Health: Wound #1 Left,Dorsal T Great: oe Other Home Health Orders/Instructions: - [facility]. The following medication(s) was prescribed: lidocaine topical 4 % cream cream topical was prescribed at facility WOUND #1: - T Great Wound Laterality: Dorsal, Left oe Cleanser: Soap and Water 1 x Per Week/30 Days Discharge Instructions: May shower and wash wound with dial antibacterial soap and water prior to dressing change. Cleanser: Wound Cleanser 1 x Per Week/30 Days  Discharge Instructions: Cleanse the wound with wound cleanser prior to applying a clean dressing using gauze sponges, not tissue or cotton balls. Peri-Wound Care: Sween Lotion (Moisturizing lotion) 1 x Per Week/30 Days Discharge Instructions: Apply moisturizing lotion as directed Topical: Ketoconazole Cream 2% 1 x Per Week/30 Days Discharge Instructions: Apply Ketoconazole as directed Topical: Triamcinolone 1 x Per Week/30 Days Discharge Instructions: Apply Triamcinolone as directed Topical: zinc 1 x Per Week/30 Days Prim Dressing: Maxorb Extra Ag+ Alginate Dressing, 4x4.75 (in/in) 1 x Per Week/30 Days ary Discharge Instructions: Apply to wound bed as instructed Secondary Dressing: ABD Pad, 8x10 1 x Per Week/30 Days Discharge Instructions: Apply over primary dressing as directed. Secondary Dressing: Drawtex 4x4 in 1 x Per Week/30 Days Discharge Instructions: Apply over primary dressing as  directed. Secondary Dressing: Woven Gauze Sponge, Non-Sterile 4x4 in 1 x Per Week/30 Days Discharge Instructions: can use gauze between toes (if no desired) Secondary Dressing: Zetuvit Plus 4x8 in 1 x Per Week/30 Days Discharge Instructions: Apply over primary dressing as directed. Com pression Wrap: Urgo K2 Lite, (equivalent to a 3 layer) two layer compression system, regular 1 x Per Week/30 Days Discharge Instructions: Apply Urgo K2 Lite as directed (alternative to 3 layer compression). WOUND #6: - Foot Wound Laterality: Dorsal, Left Cleanser: Soap and Water 1 x Per Week/30 Days Discharge Instructions: May shower and wash wound with dial antibacterial soap and water prior to dressing change. Cleanser: Wound Cleanser 1 x Per Week/30 Days Discharge Instructions: Cleanse the wound with wound cleanser prior to applying a clean dressing using gauze sponges, not tissue or cotton balls. Peri-Wound Care: Sween Lotion (Moisturizing lotion) 1 x Per Week/30 Days Discharge Instructions: Apply moisturizing lotion as directed Topical: Ketoconazole Cream 2% 1 x Per Week/30 Days Discharge Instructions: Apply Ketoconazole as directed Topical: Triamcinolone 1 x Per Week/30 Days Discharge Instructions: Apply Triamcinolone as directed Topical: zinc 1 x Per Week/30 Days Prim Dressing: Maxorb Extra Ag+ Alginate Dressing, 4x4.75 (in/in) 1 x Per Week/30 Days ary Discharge Instructions: Apply to wound bed as instructed Secondary Dressing: ABD Pad, 8x10 1 x Per Week/30 Days Fitchett, Lynnet ([id number]) [phone].pdf Page 10 of 12 Discharge Instructions: Apply over primary dressing as directed. Secondary Dressing: Drawtex 4x4 in 1 x Per Week/30 Days Discharge Instructions: Apply over primary dressing as directed. Secondary Dressing: Woven Gauze Sponge, Non-Sterile 4x4 in 1 x Per Week/30 Days Discharge Instructions: can use gauze between toes (if no desired) Secondary Dressing: Zetuvit  Plus 4x8 in 1 x Per Week/30 Days Discharge Instructions: Apply over primary dressing as directed. Com pression Wrap: Urgo K2 Lite, (equivalent to a 3 layer) two layer compression system, regular 1 x Per Week/30 Days Discharge Instructions: Apply Urgo K2 Lite as directed (alternative to 3 layer compression). WOUND #7: - T Fourth Wound Laterality: Plantar, Left oe Cleanser: Soap and Water 1 x Per Week/30 Days Discharge Instructions: May shower and wash wound with dial antibacterial soap and water prior to dressing change. Cleanser: Wound Cleanser 1 x Per Week/30 Days Discharge Instructions: Cleanse the wound with wound cleanser prior to applying a clean dressing using gauze sponges, not tissue or cotton balls. Peri-Wound Care: Sween Lotion (Moisturizing lotion) 1 x Per Week/30 Days Discharge Instructions: Apply moisturizing lotion as directed Topical: Ketoconazole Cream 2% 1 x Per Week/30 Days Discharge Instructions: Apply Ketoconazole as directed Topical: Triamcinolone 1 x Per Week/30 Days Discharge Instructions: Apply Triamcinolone as directed Topical: zinc 1 x Per Week/30 Days Prim Dressing: Endoform 2x2 in 1 x Per Week/30  Days ary Discharge Instructions: Moisten with saline Secondary Dressing: ABD Pad, 8x10 1 x Per Week/30 Days Discharge Instructions: Apply over primary dressing as directed. Secondary Dressing: Drawtex 4x4 in 1 x Per Week/30 Days Discharge Instructions: Apply over primary dressing as directed. Secondary Dressing: Woven Gauze Sponge, Non-Sterile 4x4 in 1 x Per Week/30 Days Discharge Instructions: can use gauze between toes (if no desired) Secondary Dressing: Zetuvit Plus 4x8 in 1 x Per Week/30 Days Discharge Instructions: Apply over primary dressing as directed. Com pression Wrap: Urgo K2 Lite, (equivalent to a 3 layer) two layer compression system, regular 1 x Per Week/30 Days Discharge Instructions: Apply Urgo K2 Lite as directed (alternative to 3 layer  compression). 10/03/2022: The wound on her great toe is much smaller with minimal slough accumulation. The dorsal foot is also improving. She has a new ulcer on the plantar surface of her left fourth toe, however, and bone is exposed. Edema control and tissue maceration continues to be significantly better. I used a curette to debride the slough from the great toe ulcer, as well as the new wound on her fourth toe. Given her significant digital deformities, we did discuss the possibility that she might want to consider amputation of some of the toes to alleviate the chronic issues of pressure and friction, as well as moisture accumulation that are likely to lead to future wounding issues going forward. I told her that she did not need to make a decision about any of that at this time, but that it might be something to consider going forward. We are going to apply endoform to the new wound and silver alginate to the other site, as well as we have it between her toes. Continue the mixture of ketoconazole, zinc oxide, and triamcinolone to her skin with a 3 layer compression/equivalent wrap. Follow-up in 1 week. Electronic Signature(s) Signed: 10/03/2022 10:58:19 AM By: [name] MD FACS Entered By: [name] on 10/03/2022 10:58:19 -------------------------------------------------------------------------------- HxROS Details Patient Name: Date of Service: [place] [name], [facility] RO N 10/03/2022 10:00 A M Medical Record Number: [id number] Patient Account Number: [id number] Date of Birth/Sex: Treating RN: 17-Aug-1940 (82 y.o. F) Primary Care Provider: [name], [facility] Other Clinician: Referring Provider: Treating Provider/Extender: [name], [facility] Weeks in Treatment: 16 Information Obtained From Patient Hematologic/Lymphatic Medical History: Positive for: Anemia Cardiovascular Medical History: Positive for: Angina - a-fib; Hypertension; Vasculitis Past Medical History  Notes: chest pain syndrome Endocrine Medical History: Negative for: Type I Diabetes; Type II Diabetes Thrall, Merci ([id number]) [phone].pdf Page 11 of 12 Past Medical History Notes: hypothyroidism Musculoskeletal Medical History: Positive for: 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 Neurologic Medical History: Past Medical History Notes: stroke Immunizations Pneumococcal Vaccine: Received Pneumococcal Vaccination: Yes Received Pneumococcal Vaccination On or After 60th Birthday: Yes Implantable Devices None Hospitalization / Surgery History Type of Hospitalization/Surgery cholecystectomy leg surgery tonsillectomy Family and Social History Unknown History: Yes; Former smoker - smoked when she was young; Marital Status - Widowed; Alcohol Use: Never; Drug Use: No History; Caffeine Use: Daily; Financial Concerns: No; Food, Clothing or Shelter Needs: No; Support System Lacking: No; Transportation Concerns: No Electronic Signature(s) Signed: 10/03/2022 12:23:37 PM By: [name] MD FACS Entered By: [name] on 10/03/2022 10:54:07 -------------------------------------------------------------------------------- SuperBill Details Patient Name: Date of Service: [name], [facility] RO N 10/03/2022 Medical Record Number: [id number] Patient Account Number: [id number] Date of Birth/Sex: Treating RN: Mar 21, 1941 (82 y.o. F) Primary Care Provider: [name], [facility] Other Clinician: Referring Provider: Treating Provider/Extender: [name] SHO  KES, WENDY Weeks in Treatment: 16 Diagnosis Coding ICD-10 Codes Code Description 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 without evidence of necrosis I10 Essential (primary) hypertension I63.411 Cerebral infarction due to embolism of right middle cerebral  artery 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 atrial fibrillation Z79.01 Long term (current) use of anticoagulants I89.0 Lymphedema, not elsewhere classified Facility Procedures : BRANDII, RIESER Code: [id number] Iness [phone] Description: [phone] - DEBRIDE WOUND 1ST 20 SQ CM OR < ICD-10 Diagnosis Description 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  witho 1664) [id number] Modifier: ut evidence of [id number] Pag Quantity: 1 osis e 12 of 12 Physician Procedures : CPT4 Code Description Modifier [id number] - WC PHYS LEVEL 4 - EST PT 25 ICD-10 Diagnosis Description L97.522 Non-pressure chronic ulcer of other part of left foot with fat layer exposed L97.526 Non-pressure chronic ulcer of other part of left foot  with bone involvement without evidence of necro I89.0 Lymphedema, not elsewhere classified 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 Quantity: 1 sis : [id number] - WC PHYS DEBR WO ANESTH 20 SQ CM ICD-10 Diagnosis Description 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  without evidence of necro Quantity: 1 sis Electronic Signature(s) Signed: 10/03/2022 10:58:46 AM By: [name] MD FACS Entered By: [name] on 10/03/2022 10:58:46

## 2022-10-04 NOTE — Progress Notes (Signed)
AALIYAH, [place] ([id number]) [id number] Page 1 of 10 Visit Report for 10/03/2022 Arrival Information Details Patient Name: Date of Service: [name] RO N 10/03/2022 10:00 A M Medical Record Number: [id number] Patient Account Number: [id number] Date of Birth/Sex: Treating RN: 1940/11/18 (82 y.o. [name] Primary Care [name]: [name], [facility] Other Clinician: Referring Brittin Janik: Treating [name]/Extender: [name], [facility] Weeks in Treatment: 16 Visit Information History Since Last Visit Added or deleted any medications: No Patient Arrived: Wheel Chair Any new allergies or adverse reactions: No Arrival Time: 10:16 Had a fall or experienced change in No Accompanied By: caregiver activities of daily living that may affect Transfer Assistance: Manual risk of falls: Patient Identification Verified: Yes Signs or symptoms of abuse/neglect since last visito No Secondary Verification Process Completed: Yes Hospitalized since last visit: No Patient Requires Transmission-Based Precautions: No Implantable device outside of the clinic excluding No Patient Has Alerts: No cellular tissue based products placed in the center since last visit: Has Dressing in Place as Prescribed: Yes Has Compression in Place as Prescribed: Yes Pain Present Now: No Electronic Signature(s) Signed: 10/03/2022 3:47:10 PM By: [name] Entered By: [name] on 10/03/2022 10:17:09 -------------------------------------------------------------------------------- Compression Therapy Details Patient Name: Date of Service: [name] [facility] RO N 10/03/2022 10:00 A M Medical Record Number: [id number] Patient Account Number: [id number] Date of Birth/Sex: Treating RN: [birth year] (82 y.o. [name] Primary Care [name]: [name], [facility] Other Clinician: Referring Willowdean Luhmann: Treating Ziyad Dyar/Extender: [name] SHO [name], [facility] Weeks in  Treatment: 16 Compression Therapy Performed for Wound Assessment: Wound #6 Left,Dorsal Foot Performed By: Clinician [name], RN Compression Type: Double Layer Post Procedure Diagnosis Same as Pre-procedure Electronic Signature(s) Signed: 10/03/2022 3:47:10 PM By: Gelene Mink By: [name] on 10/03/2022 10:30:11 -------------------------------------------------------------------------------- Encounter Discharge Information Details Patient Name: Date of Service: [name], [facility] RO N 10/03/2022 10:00 A M Medical Record Number: [id number] Patient Account Number: [id number] Date of Birth/Sex: Treating RN: 1940/12/01 (82 y.o. [name] Primary Care Kym Scannell: [name], [facility] Other Clinician: Referring Hart Haas: Treating Jonh Mcqueary/Extender: [name] SHO [name], [facility] Weeks in Treatment: 49 Encounter Discharge Information Items Post Procedure Vitals Discharge Condition: Stable Temperature (F): 97.7 Ambulatory Status: Wheelchair Pulse (bpm): 97 Discharge Destination: Home Respiratory Rate (breaths/min): 18 Transportation: Private Auto Blood Pressure (mmHg): 115/79 [name], [name] ([id number]) [id number] Page 2 of 10 Accompanied By: caregiver Schedule Follow-up Appointment: Yes Clinical Summary of Care: Patient Declined Electronic Signature(s) Signed: 10/03/2022 3:47:10 PM By: Gelene Mink By: [name] on 10/03/2022 10:59:54 -------------------------------------------------------------------------------- Lower Extremity Assessment Details Patient Name: Date of Service: [name] RO N 10/03/2022 10:00 A M Medical Record Number: [id number] Patient Account Number: [id number] Date of Birth/Sex: Treating RN: 12-05-1940 (82 y.o. [name] Primary Care Oanh Devivo: [name], [facility] Other Clinician: Referring Lien Lyman: Treating Hani Campusano/Extender: [name] SHO KES, [facility] Weeks  in Treatment: 16 Edema Assessment Assessed: [Left: No] [Right: No] Edema: [Left: Ye] [Right: s] Calf Left: Right: Point of Measurement: From Medial Instep 30.2 cm Ankle Left: Right: Point of Measurement: From Medial Instep 21.8 cm Vascular Assessment Pulses: Dorsalis Pedis Palpable: [Left:Yes] Electronic Signature(s) Signed: 10/03/2022 3:47:10 PM By: [name] Entered By: [name] on 10/03/2022 10:21:05 -------------------------------------------------------------------------------- Multi Wound Chart Details Patient Name: Date of Service: [name], [facility] RO N 10/03/2022 10:00 A M Medical Record Number: [id number] Patient Account Number: [id number] Date of Birth/Sex: Treating RN: [birth year] (82 y.o. F) Primary Care Octavian Godek: [name], [facility] Other Clinician: Referring Violette Morneault: Treating Chaos Carlile/Extender: Lady Gary  Jennifer SHO KES, [place] Weeks in Treatment: 16 Vital Signs Height(in): 67 Pulse(bpm): 97 Weight(lbs): 153 Blood Pressure(mmHg): 115/79 Body Mass Index(BMI): 24 Temperature(F): 97.7 Respiratory Rate(breaths/min): 18 [1:Photos:] [7:No Photos [id number].pdf Page 3 of 10] Left, Dorsal T Great oe Left, Dorsal Foot Left, Plantar T Fourth oe Wound Location: Footwear Injury Gradually Appeared Gradually Appeared Wounding Event: Abrasion Lymphedema Atypical Primary Etiology: Anemia, Angina, Hypertension, Anemia, Angina, Hypertension, Anemia, Angina, Hypertension, Comorbid History: Vasculitis, Osteoarthritis Vasculitis, Osteoarthritis Vasculitis, Osteoarthritis 05/24/2021 09/05/2022 10/03/2022 Date Acquired: 16 4 0 Weeks of Treatment: Open Open Open Wound Status: No No No Wound Recurrence: 0.2x0.5x0.1 3.2x4x0.1 0.3x0.3x0.1 Measurements L x W x D (cm) 0.079 10.053 0.071 A (cm) : rea 0.008 1.005 0.007 Volume (cm) : 82.00% 74.10% N/A % Reduction in A rea: 81.80% 74.20% N/A % Reduction in Volume: Full Thickness Without  Exposed Partial Thickness Full Thickness With Exposed Support Classification: Support Structures Structures Medium Large Medium Exudate A mount: Serous Serous Serosanguineous Exudate Type: amber amber red, brown Exudate Color: Flat and Intact Distinct, outline attached Distinct, outline attached Wound Margin: Medium (34-66%) Small (1-33%) Large (67-100%) Granulation A mount: Red Red Red Granulation Quality: Medium (34-66%) Large (67-100%) Small (1-33%) Necrotic A mount: Fat Layer (Subcutaneous Tissue): Yes Fat Layer (Subcutaneous Tissue): Yes Fat Layer (Subcutaneous Tissue): Yes Exposed Structures: Fascia: No Fascia: No Bone: Yes Tendon: No Tendon: No Fascia: No Muscle: No Muscle: No Tendon: No Joint: No Joint: No Muscle: No Bone: No Bone: No Joint: No Large (67-100%) Medium (34-66%) None Epithelialization: Debridement - Selective/Open Wound N/A Debridement - Selective/Open Wound Debridement: Pre-procedure Verification/Time Out 10:30 N/A 10:30 Taken: Lidocaine 4% Topical Solution N/A Lidocaine 4% Topical Solution Pain Control: Slough N/A Slough Tissue Debrided: Non-Viable Tissue N/A Non-Viable Tissue Level: 0.08 N/A 0.07 Debridement A (sq cm): rea Curette N/A Curette Instrument: Minimum N/A Minimum Bleeding: Pressure N/A Pressure Hemostasis A chieved: Procedure was tolerated well N/A Procedure was tolerated well Debridement Treatment Response: 0.2x0.5x0.1 N/A 0.3x0.3x0.1 Post Debridement Measurements L x W x D (cm) 0.008 N/A 0.007 Post Debridement Volume: (cm) Scarring: Yes Excoriation: Yes No Abnormalities Noted Periwound Skin Texture: Maceration: No Maceration: Yes No Abnormalities Noted Periwound Skin Moisture: Dry/Scaly: No Rubor: No Rubor: Yes Rubor: Yes Periwound Skin Color: No Abnormality No Abnormality No Abnormality Temperature: Yes N/A N/A Tenderness on Palpation: Debridement Compression Therapy Debridement Procedures  Performed: Treatment Notes Electronic Signature(s) Signed: 10/03/2022 10:52:52 AM By: [name] MD FACS Entered By: [name] on 10/03/2022 10:52:52 -------------------------------------------------------------------------------- Multi-Disciplinary Care Plan Details Patient Name: Date of Service: [name], [facility] RO N 10/03/2022 10:00 A M Medical Record Number: [id number] Patient Account Number: [id number] Date of Birth/Sex: Treating RN: 1940-12-01 (82 y.o. [name] Primary Care Harue Pribble: [name], [facility] Other Clinician: Referring Kenyia Wambolt: Treating Lc Joynt/Extender: [name] SHO [name], [facility] Weeks in Treatment: 48 Active Inactive Necrotic Tissue Nursing Diagnoses: Impaired tissue integrity related to necrotic/devitalized tissue [name], [name] ([id number]) [id number] Page 4 of 10 Knowledge deficit related to management of necrotic/devitalized tissue Goals: Necrotic/devitalized tissue will be minimized in the wound bed Date Initiated: 06/12/2022 Target Resolution Date: 10/26/2022 Goal Status: Active Patient/caregiver will verbalize understanding of reason and process for debridement of necrotic tissue Date Initiated: 06/12/2022 Target Resolution Date: 10/26/2022 Goal Status: Active Interventions: Assess patient pain level pre-, during and post procedure and prior to discharge Provide education on necrotic tissue and debridement process Treatment Activities: Apply topical anesthetic as ordered : 06/12/2022 Notes: Wound/Skin Impairment Nursing Diagnoses: Impaired tissue integrity Knowledge deficit related to ulceration/compromised skin integrity  Goals: Patient/caregiver will verbalize understanding of skin care regimen Date Initiated: 06/12/2022 Target Resolution Date: 10/26/2022 Goal Status: Active Interventions: Assess ulceration(s) every visit Treatment Activities: Skin care regimen initiated : 06/12/2022 Topical wound  management initiated : 06/12/2022 Notes: Electronic Signature(s) Signed: 10/03/2022 3:47:10 PM By: [name] Entered By: [name] on 10/03/2022 10:37:30 -------------------------------------------------------------------------------- Pain Assessment Details Patient Name: Date of Service: [name] [facility] RO N 10/03/2022 10:00 A M Medical Record Number: [id number] Patient Account Number: [id number] Date of Birth/Sex: Treating RN: 05-23-1940 (82 y.o. [name] Primary Care Lafayette Dunlevy: [name], [facility] Other Clinician: Referring Vince Ainsley: Treating Waunita Sandstrom/Extender: [name] SHO [name], [facility] Weeks in Treatment: 37 Active Problems Location of Pain Severity and Description of Pain Patient Has Paino No Site Locations Rate the pain. TAYTEM, PAPWORTH ([id number]) [id number] Page 5 of 10 Rate the pain. Current Pain Level: 0 Pain Management and Medication Current Pain Management: Electronic Signature(s) Signed: 10/03/2022 3:47:10 PM By: [name] Entered By: [name] on 10/03/2022 10:17:31 -------------------------------------------------------------------------------- Patient/Caregiver Education Details Patient Name: Date of Service: [name], [facility] RO N 6/12/2024andnbsp10:00 A M Medical Record Number: [id number] Patient Account Number: [id number] Date of Birth/Gender: Treating RN: 1940/05/07 (82 y.o. [name] Primary Care Physician: [name], [facility] Other Clinician: Referring Physician: Treating Physician/Extender: [name], [facility] Weeks in Treatment: 48 Education Assessment Education Provided To: Patient Education Topics Provided Wound/Skin Impairment: Methods: Explain/Verbal Responses: Reinforcements needed, State content correctly Electronic Signature(s) Signed: 10/03/2022 3:47:10 PM By: [name] Entered By: [name] on 10/03/2022  10:37:44 -------------------------------------------------------------------------------- Wound Assessment Details Patient Name: Date of Service: [name] [facility] RO N 10/03/2022 10:00 A M Medical Record Number: [id number] Patient Account Number: [id number] Date of Birth/Sex: Treating RN: [birth year] (82 y.o. [name] Primary Care Michaella Imai: [name], [facility] Other Clinician: Referring Emile Kyllo: Treating Kain Milosevic/Extender: [name] SHO KES, [facility] Weeks in Treatment: 16 Wound Status Wound Number: 1 Primary Etiology: Abrasion Wound Location: Left, Dorsal T Great oe Wound Status: Open Wounding Event: Footwear Injury Comorbid History: Anemia, Angina, Hypertension, Vasculitis, Osteoarthritis Date Acquired: 05/24/2021 Weeks Of Treatment: 16 Clustered Wound: No [name], [name] ([id number]) [id number] Page 6 of 10 Photos Wound Measurements Length: (cm) 0.2 Width: (cm) 0.5 Depth: (cm) 0.1 Area: (cm) 0.079 Volume: (cm) 0.008 % Reduction in Area: 82% % Reduction in Volume: 81.8% Epithelialization: Large (67-100%) Tunneling: No Undermining: No Wound Description Classification: 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 Support Structures Wound Margin: Flat and Intact Exudate Amount: Medium Exudate Type: Serous Exudate Color: amber Foul Odor After Cleansing: No Slough/Fibrino Yes Wound Bed Granulation Amount: Medium (34-66%) Exposed Structure Granulation Quality: Red Fascia Exposed: No Necrotic Amount: Medium (34-66%) Fat Layer (Subcutaneous Tissue) Exposed: Yes Necrotic Quality: Adherent Slough Tendon Exposed: No Muscle Exposed: No Joint Exposed: No Bone Exposed: No Periwound Skin Texture Texture Color No Abnormalities Noted: Yes No Abnormalities Noted: Yes Moisture Temperature / Pain No Abnormalities Noted: No Temperature: No Abnormality Dry / Scaly: No Tenderness on Palpation: Yes Maceration: No Treatment Notes Wound #1 (Toe Great) Wound  Laterality: Dorsal, Left Cleanser Soap and Water Discharge Instruction: May shower and wash wound with dial antibacterial soap and water prior to dressing change. Wound Cleanser Discharge Instruction: Cleanse the wound with wound cleanser prior to applying a clean dressing using gauze sponges, not tissue or cotton balls. Peri-Wound Care Sween Lotion (Moisturizing lotion) Discharge Instruction: Apply moisturizing lotion as directed Topical Ketoconazole Cream 2% Discharge Instruction: Apply Ketoconazole as directed Triamcinolone Discharge Instruction: Apply Triamcinolone as directed zinc Primary Dressing Maxorb Extra Ag+  Alginate Dressing, 4x4.75 (in/in) Discharge Instruction: Apply to wound bed as instructed Secondary Dressing NERSESIAN, Dhrithi ([id number]) [id number] Page 7 of 10 ABD Pad, 8x10 Discharge Instruction: Apply over primary dressing as directed. Drawtex 4x4 in Discharge Instruction: Apply over primary dressing as directed. Woven Gauze Sponge, Non-Sterile 4x4 in Discharge Instruction: can use gauze between toes (if no desired) Zetuvit Plus 4x8 in Discharge Instruction: Apply over primary dressing as directed. Secured With Compression Wrap Urgo K2 Lite, (equivalent to a 3 layer) two layer compression system, regular Discharge Instruction: Apply Urgo K2 Lite as directed (alternative to 3 layer compression). Compression Stockings Add-Ons Electronic Signature(s) Signed: 10/03/2022 3:47:10 PM By: [name] Entered By: [name] on 10/03/2022 10:26:49 -------------------------------------------------------------------------------- Wound Assessment Details Patient Name: Date of Service: [name] [facility] RO N 10/03/2022 10:00 A M Medical Record Number: [id number] Patient Account Number: [id number] Date of Birth/Sex: Treating RN: [birth year] (82 y.o. [name] Primary Care Jordanna Hendrie: [name], [facility] Other Clinician: Referring  Brittanny Levenhagen: Treating Lura Falor/Extender: [name] SHO [name], [facility] Weeks in Treatment: 16 Wound Status Wound Number: 6 Primary Etiology: Lymphedema Wound Location: Left, Dorsal Foot Wound Status: Open Wounding Event: Gradually Appeared Comorbid History: Anemia, Angina, Hypertension, Vasculitis, Osteoarthritis Date Acquired: 09/05/2022 Weeks Of Treatment: 4 Clustered Wound: No Photos Wound Measurements Length: (cm) 3.2 Width: (cm) 4 Depth: (cm) 0.1 Area: (cm) 10.053 Volume: (cm) 1.005 % Reduction in Area: 74.1% % Reduction in Volume: 74.2% Epithelialization: Medium (34-66%) Tunneling: No Undermining: No Wound Description Classification: Partial Thickness Wound Margin: Distinct, outline attached Exudate Amount: Large Exudate Type: Serous Exudate Color: amber Foul Odor After Cleansing: No Slough/Fibrino Yes Wound Bed Granulation Amount: Small (1-33%) Exposed Structure [name], [name] ([id number]) [id number] Page 8 of 10 Granulation Quality: Red Fascia Exposed: No Necrotic Amount: Large (67-100%) Fat Layer (Subcutaneous Tissue) Exposed: Yes Necrotic Quality: Adherent Slough Tendon Exposed: No Muscle Exposed: No Joint Exposed: No Bone Exposed: No Periwound Skin Texture Texture Color No Abnormalities Noted: No No Abnormalities Noted: No Excoriation: Yes Rubor: Yes Moisture Temperature / Pain No Abnormalities Noted: No Temperature: No Abnormality Maceration: Yes Treatment Notes Wound #6 (Foot) Wound Laterality: Dorsal, Left Cleanser Soap and Water Discharge Instruction: May shower and wash wound with dial antibacterial soap and water prior to dressing change. Wound Cleanser Discharge Instruction: Cleanse the wound with wound cleanser prior to applying a clean dressing using gauze sponges, not tissue or cotton balls. Peri-Wound Care Sween Lotion (Moisturizing lotion) Discharge Instruction: Apply moisturizing lotion as  directed Topical Ketoconazole Cream 2% Discharge Instruction: Apply Ketoconazole as directed Triamcinolone Discharge Instruction: Apply Triamcinolone as directed zinc Primary Dressing Maxorb Extra Ag+ Alginate Dressing, 4x4.75 (in/in) Discharge Instruction: Apply to wound bed as instructed Secondary Dressing ABD Pad, 8x10 Discharge Instruction: Apply over primary dressing as directed. Drawtex 4x4 in Discharge Instruction: Apply over primary dressing as directed. Woven Gauze Sponge, Non-Sterile 4x4 in Discharge Instruction: can use gauze between toes (if no desired) Zetuvit Plus 4x8 in Discharge Instruction: Apply over primary dressing as directed. Secured With Compression Wrap Urgo K2 Lite, (equivalent to a 3 layer) two layer compression system, regular Discharge Instruction: Apply Urgo K2 Lite as directed (alternative to 3 layer compression). Compression Stockings Add-Ons Electronic Signature(s) Signed: 10/03/2022 3:47:10 PM By: [name] Entered By: [name] on 10/03/2022 10:27:10 -------------------------------------------------------------------------------- Wound Assessment Details Patient Name: Date of Service: [name] [facility] RO N 10/03/2022 10:00 A M Medical Record Number: [id number] Patient Account Number: [id number] Date of Birth/Sex: Treating RN: [birth year] (82 y.o. [name] Primary Care Chin Wachter:  SHO KES, [facility] Other ClinicianSHENETTE, [name] ([id number]) [id number] Page 9 of 10 Referring Kanoelani Dobies: Treating Massey Ruhland/Extender: [name] SHO [name], [facility] Weeks in Treatment: 16 Wound Status Wound Number: 7 Primary Etiology: Atypical Wound Location: Left, Plantar T Fourth oe Wound Status: Open Wounding Event: Gradually Appeared Comorbid History: Anemia, Angina, Hypertension, Vasculitis, Osteoarthritis Date Acquired: 10/03/2022 Weeks Of Treatment: 0 Clustered Wound: No Wound Measurements Length: (cm)  0.3 Width: (cm) 0.3 Depth: (cm) 0.1 Area: (cm) 0.071 Volume: (cm) 0.007 % Reduction in Area: % Reduction in Volume: Epithelialization: None Tunneling: No Undermining: No Wound Description Classification: Full Thickness With Exposed Support Structures Wound Margin: Distinct, outline attached Exudate Amount: Medium Exudate Type: Serosanguineous Exudate Color: red, brown Foul Odor After Cleansing: No Slough/Fibrino Yes Wound Bed Granulation Amount: Large (67-100%) Exposed Structure Granulation Quality: Red Fascia Exposed: No Necrotic Amount: Small (1-33%) Fat Layer (Subcutaneous Tissue) Exposed: Yes Necrotic Quality: Adherent Slough Tendon Exposed: No Muscle Exposed: No Joint Exposed: No Bone Exposed: Yes Periwound Skin Texture Texture Color No Abnormalities Noted: Yes No Abnormalities Noted: No Rubor: Yes Moisture No Abnormalities Noted: Yes Temperature / Pain Temperature: No Abnormality Treatment Notes Wound #7 (Toe Fourth) Wound Laterality: Plantar, Left Cleanser Soap and Water Discharge Instruction: May shower and wash wound with dial antibacterial soap and water prior to dressing change. Wound Cleanser Discharge Instruction: Cleanse the wound with wound cleanser prior to applying a clean dressing using gauze sponges, not tissue or cotton balls. Peri-Wound Care Sween Lotion (Moisturizing lotion) Discharge Instruction: Apply moisturizing lotion as directed Topical Ketoconazole Cream 2% Discharge Instruction: Apply Ketoconazole as directed Triamcinolone Discharge Instruction: Apply Triamcinolone as directed zinc Primary Dressing Endoform 2x2 in Discharge Instruction: Moisten with saline Secondary Dressing ABD Pad, 8x10 Discharge Instruction: Apply over primary dressing as directed. Drawtex 4x4 in Discharge Instruction: Apply over primary dressing as directed. [name], [name] ([id number]) [id number] Page 10 of 10 Woven Gauze  Sponge, Non-Sterile 4x4 in Discharge Instruction: can use gauze between toes (if no desired) Zetuvit Plus 4x8 in Discharge Instruction: Apply over primary dressing as directed. Secured With Compression Wrap Urgo K2 Lite, (equivalent to a 3 layer) two layer compression system, regular Discharge Instruction: Apply Urgo K2 Lite as directed (alternative to 3 layer compression). Compression Stockings Add-Ons Electronic Signature(s) Signed: 10/03/2022 3:47:10 PM By: [name] Entered By: [name] on 10/03/2022 10:33:06 -------------------------------------------------------------------------------- Vitals Details Patient Name: Date of Service: [name], [facility] RO N 10/03/2022 10:00 A M Medical Record Number: [id number] Patient Account Number: [id number] Date of Birth/Sex: Treating RN: Mar 26, 1941 (82 y.o. [name] Primary Care Keats Kingry: [name], [facility] Other Clinician: Referring Lawanna Cecere: Treating Dryden Tapley/Extender: [name] SHO [name], [facility] Weeks in Treatment: 16 Vital Signs Time Taken: 10:17 Temperature (F): 97.7 Height (in): 67 Pulse (bpm): 97 Weight (lbs): 153 Respiratory Rate (breaths/min): 18 Body Mass Index (BMI): 24 Blood Pressure (mmHg): 115/79 Reference Range: 80 - 120 mg / dl Electronic Signature(s) Signed: 10/03/2022 3:47:10 PM By: [name] Entered By: [name] on 10/03/2022 10:17:26

## 2022-10-09 ENCOUNTER — Encounter (HOSPITAL_BASED_OUTPATIENT_CLINIC_OR_DEPARTMENT_OTHER): Payer: Medicare Other | Admitting: General Surgery

## 2022-10-09 DIAGNOSIS — I48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 atrial fibrillation: Secondary | ICD-10-CM | POA: Diagnosis not present

## 2022-10-09 NOTE — Progress Notes (Signed)
TIMITRA, WALDMANN ([id number]) [id number] Page 1 of 11 Visit Report for 10/09/2022 Arrival Information Details Patient Name: Date of Service: [name] RO N 10/09/2022 3:00 PM Medical Record Number: [id number] Patient Account Number: [id number] Date of Birth/Sex: Treating RN: Feb 21, 1941 (82 y.o. [name] Primary Care Shamra Bradeen: [name], [facility] Other Clinician: Referring Adison Reifsteck: Treating Lachrista Heslin/Extender: [name], [facility] Weeks in Treatment: 17 Visit Information History Since Last Visit Added or deleted any medications: No Patient Arrived: Wheel Chair Any new allergies or adverse reactions: No Arrival Time: 14:51 Had a fall or experienced change in No Accompanied By: caregiver activities of daily living that may affect Transfer Assistance: Manual risk of falls: Patient Identification Verified: Yes Signs or symptoms of abuse/neglect since last visito No Secondary Verification Process Completed: Yes Hospitalized since last visit: No Patient Requires Transmission-Based Precautions: No Implantable device outside of the clinic excluding No Patient Has Alerts: No cellular tissue based products placed in the center since last visit: Has Dressing in Place as Prescribed: Yes Has Compression in Place as Prescribed: Yes Pain Present Now: No Electronic Signature(s) Signed: 10/09/2022 4:05:21 PM By: [name] Entered By: [name] on 10/09/2022 14:51:49 -------------------------------------------------------------------------------- Compression Therapy Details Patient Name: Date of Service: [name] [facility] RO N 10/09/2022 3:00 PM Medical Record Number: [id number] Patient Account Number: [id number] Date of Birth/Sex: Treating RN: Feb 22, 1941 (82 y.o. [name] Primary Care Verne Lanuza: [name], [facility] Other Clinician: Referring Aspyn Warnke: Treating Sony Schlarb/Extender: [name] SHO [name], [facility] Weeks in  Treatment: 17 Compression Therapy Performed for Wound Assessment: Wound #6 Left,Dorsal Foot Performed By: Clinician [name], RN Compression Type: Double Layer Post Procedure Diagnosis Same as Pre-procedure Electronic Signature(s) Signed: 10/09/2022 4:05:21 PM By: [name] Entered By: [name] on 10/09/2022 15:12:44 Hand, Venisha ([id number]) [id number] Page 2 of 11 -------------------------------------------------------------------------------- Encounter Discharge Information Details Patient Name: Date of Service: [name] 10/09/2022 3:00 PM Medical Record Number: [id number] Patient Account Number: [id number] Date of Birth/Sex: Treating RN: 1940-10-10 (82 y.o. [name] Primary Care Kaidance Pantoja: [name], [facility] Other Clinician: Referring Rohn Fritsch: Treating Khayri Kargbo/Extender: [name] SHO [name], [facility] Weeks in Treatment: 17 Encounter Discharge Information Items Post Procedure Vitals Discharge Condition: Stable Temperature (F): 98.1 Ambulatory Status: Wheelchair Pulse (bpm): 79 Discharge Destination: Home Respiratory Rate (breaths/min): 16 Transportation: Private Auto Blood Pressure (mmHg): 118/78 Accompanied By: caregiver Schedule Follow-up Appointment: Yes Clinical Summary of Care: Patient Declined Electronic Signature(s) Signed: 10/09/2022 4:05:21 PM By: [name] Entered By: [name] on 10/09/2022 15:57:38 -------------------------------------------------------------------------------- Lower Extremity Assessment Details Patient Name: Date of Service: [name] [facility] RO N 10/09/2022 3:00 PM Medical Record Number: [id number] Patient Account Number: [id number] Date of Birth/Sex: Treating RN: 1941/01/25 (82 y.o. [name] Primary Care Steele Ledonne: [name], [facility] Other Clinician: Referring Phallon Haydu: Treating Wladyslaw Henrichs/Extender: [name] SHO [name], [facility] Weeks in  Treatment: 17 Edema Assessment Assessed: [Left: No] [Right: No] Edema: [Left: Ye] [Right: s] Calf Left: Right: Point of Measurement: From Medial Instep 29 cm Ankle Left: Right: Point of Measurement: From Medial Instep 20.8 cm Vascular Assessment Pulses: Dorsalis Pedis Palpable: [Left:Yes] Electronic Signature(s) Signed: 10/09/2022 4:05:21 PM By: [name] Entered By: [name] on 10/09/2022 14:59:51 Multi Wound Chart Details -------------------------------------------------------------------------------- [name] ([id number]) [id number] Page 3 of 11 Patient Name: Date of Service: [name] 10/09/2022 3:00 PM Medical Record Number: [id number] Patient Account Number: [id number] Date of Birth/Sex: Treating RN: Sep 03, 1940 (82 y.o. F) Primary Care Miko Markwood: [name], [facility] Other Clinician: Referring Maricia Scotti:  Treating Shataya Winkles/Extender: [name] SHO KES, WENDY Weeks in Treatment: 17 Vital Signs Height(in): 67 Pulse(bpm): 79 Weight(lbs): 153 Blood Pressure(mmHg): 118/78 Body Mass Index(BMI): 24 Temperature(F): 98.1 Respiratory Rate(breaths/min): 16 Wound Assessments Wound Number: 1 6 7  Photos: Left, Dorsal T Great oe Left, Dorsal Foot Left, Plantar T Fourth oe Wound Location: Footwear Injury Gradually Appeared Gradually Appeared Wounding Event: Abrasion Lymphedema Atypical Primary Etiology: Anemia, Angina, Hypertension, Anemia, Angina, Hypertension, Anemia, Angina, Hypertension, Comorbid History: Vasculitis, Osteoarthritis Vasculitis, Osteoarthritis Vasculitis, Osteoarthritis 05/24/2021 09/05/2022 10/03/2022 Date Acquired: 17 4 0 Weeks of Treatment: Open Open Open Wound Status: No No No Wound Recurrence: 0.2x0.5x0.1 10x5x0.1 0.3x0.2x0.1 Measurements L x W x D (cm) 0.079 39.27 0.047 A (cm) : rea 0.008 3.927 0.005 Volume (cm) : 82.00% -1.00% 33.80% % Reduction in A rea: 81.80% -1.00% 28.60% %  Reduction in Volume: 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 Partial Thickness Full Thickness With Exposed Support Classification: Support Structures Structures Medium Large Medium Exudate A mount: Serous Serous Serous Exudate Type: amber amber amber Exudate Color: Flat and Intact Distinct, outline attached Distinct, outline attached Wound Margin: Small (1-33%) Small (1-33%) Medium (34-66%) Granulation A mount: Red Red Red Granulation Quality: Large (67-100%) Large (67-100%) Medium (34-66%) Necrotic A mount: Fat Layer (Subcutaneous Tissue): Yes Fat Layer (Subcutaneous Tissue): Yes Fat Layer (Subcutaneous Tissue): Yes Exposed Structures: Fascia: No Fascia: No Bone: Yes Tendon: No Tendon: No Fascia: No Muscle: No Muscle: No Tendon: No Joint: No Joint: No Muscle: No Bone: No Bone: No Joint: No Large (67-100%) Small (1-33%) None Epithelialization: Debridement - Selective/Open Wound Debridement - Selective/Open Wound Debridement - Selective/Open Wound Debridement: Pre-procedure Verification/Time Out 15:10 15:10 15:10 Taken: Lidocaine 4% Topical Solution Lidocaine 4% Topical Solution Lidocaine 4% Topical Solution Pain Control: Principal Financial Tissue Debrided: Non-Viable Tissue Non-Viable Tissue Non-Viable Tissue Level: 0.08 11.78 0.05 Debridement A (sq cm): rea Curette Curette Curette Instrument: Minimum Minimum Minimum Bleeding: Pressure Pressure Pressure Hemostasis A chieved: Procedure was tolerated well Procedure was tolerated well Procedure was tolerated well Debridement Treatment Response: 0.2x0.5x0.1 10x5x0.1 0.3x0.2x0.1 Post Debridement Measurements L x W x D (cm) 0.008 3.927 0.005 Post Debridement Volume: (cm) Scarring: Yes Excoriation: Yes No Abnormalities Noted Periwound Skin Texture: Maceration: No Maceration: Yes No Abnormalities Noted Periwound Skin Moisture: Dry/Scaly: No Rubor: No Rubor: Yes Rubor: Yes Periwound Skin Color: No Abnormality  No Abnormality No Abnormality Temperature: Yes N/A N/A Tenderness on Palpation: Debridement Compression Therapy Debridement Procedures Performed: Debridement Treatment Notes MAPLE, MOWAT ([id number]) [id number] Page 4 of 11 Electronic Signature(s) Signed: 10/09/2022 3:49:57 PM By: [name] MD FACS Entered By: [name] on 10/09/2022 15:49:57 -------------------------------------------------------------------------------- Multi-Disciplinary Care Plan Details Patient Name: Date of Service: [name], [facility] RO N 10/09/2022 3:00 PM Medical Record Number: [id number] Patient Account Number: [id number] Date of Birth/Sex: Treating RN: 06-19-1940 (82 y.o. [name] Primary Care Ralynn San: [name], [facility] Other Clinician: Referring Calah Gershman: Treating Jamieon Lannen/Extender: [name], [facility] Weeks in Treatment: 17 Active Inactive Necrotic Tissue Nursing Diagnoses: Impaired tissue integrity related to necrotic/devitalized tissue Knowledge deficit related to management of necrotic/devitalized tissue Goals: Necrotic/devitalized tissue will be minimized in the wound bed Date Initiated: 06/12/2022 Target Resolution Date: 10/26/2022 Goal Status: Active Patient/caregiver will verbalize understanding of reason and process for debridement of necrotic tissue Date Initiated: 06/12/2022 Target Resolution Date: 10/26/2022 Goal Status: Active Interventions: Assess patient pain level pre-, during and post procedure and prior to discharge Provide education on necrotic tissue and debridement process Treatment Activities: Apply topical anesthetic as ordered : 06/12/2022 Notes: Wound/Skin Impairment  Nursing Diagnoses: Impaired tissue integrity Knowledge deficit related to ulceration/compromised skin integrity Goals: Patient/caregiver will verbalize understanding of skin care regimen Date Initiated: 06/12/2022 Target Resolution Date:  10/26/2022 Goal Status: Active Interventions: Assess ulceration(s) every visit Treatment Activities: Skin care regimen initiated : 06/12/2022 Topical wound management initiated : 06/12/2022 Notes: Electronic Signature(s) Signed: 10/09/2022 4:05:21 PM By: [name] Entered By: [name] on 10/09/2022 15:10:05 Bidwell, Adriahna ([id number]) [id number] Page 5 of 11 -------------------------------------------------------------------------------- Pain Assessment Details Patient Name: Date of Service: [name] 10/09/2022 3:00 PM Medical Record Number: [id number] Patient Account Number: [id number] Date of Birth/Sex: Treating RN: 21-Jan-1941 (81 y.o. [name] Primary Care Anandi Abramo: [name], [facility] Other Clinician: Referring Tyliek Timberman: Treating Freddye Cardamone/Extender: [name] SHO [name], [facility] Weeks in Treatment: 17 Active Problems Location of Pain Severity and Description of Pain Patient Has Paino No Site Locations Rate the pain. Current Pain Level: 0 Pain Management and Medication Current Pain Management: Electronic Signature(s) Signed: 10/09/2022 4:05:21 PM By: [name] Entered By: [name] on 10/09/2022 14:53:39 -------------------------------------------------------------------------------- Patient/Caregiver Education Details Patient Name: Date of Service: [name] RO N 6/18/2024andnbsp3:00 PM Medical Record Number: [id number] Patient Account Number: [id number] Date of Birth/Gender: Treating RN: Jan 18, 1941 (82 y.o. [name] Primary Care Physician: [name], [facility] Other Clinician: Referring Physician: Treating Physician/Extender: [name], [facility] Weeks in Treatment: 8 Education Assessment Education Provided To: Patient Education Topics Provided Wound/Skin Impairment: Methods: Explain/Verbal Responses: Reinforcements needed, State content correctly Marana,  Aeon ([id number]) [id number] Page 6 of 11 Electronic Signature(s) Signed: 10/09/2022 4:05:21 PM By: [name] Entered By: [name] on 10/09/2022 15:10:16 -------------------------------------------------------------------------------- Wound Assessment Details Patient Name: Date of Service: [name] RO N 10/09/2022 3:00 PM Medical Record Number: [id number] Patient Account Number: [id number] Date of Birth/Sex: Treating RN: September 26, 1940 (82 y.o. [name] Primary Care Makenlee Mckeag: [name], [facility] Other Clinician: Referring Hugh Kamara: Treating Adal Sereno/Extender: [name] SHO KES, [facility] Weeks in Treatment: 17 Wound Status Wound Number: 1 Primary Etiology: Abrasion Wound Location: Left, Dorsal T Great oe Wound Status: Open Wounding Event: Footwear Injury Comorbid History: Anemia, Angina, Hypertension, Vasculitis, Osteoarthritis Date Acquired: 05/24/2021 Weeks Of Treatment: 17 Clustered Wound: No Photos Wound Measurements Length: (cm) 0.2 Width: (cm) 0.5 Depth: (cm) 0.1 Area: (cm) 0.079 Volume: (cm) 0.008 % Reduction in Area: 82% % Reduction in Volume: 81.8% Epithelialization: Large (67-100%) Tunneling: No Undermining: No Wound Description Classification: 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 Support Structures Wound Margin: Flat and Intact Exudate Amount: Medium Exudate Type: Serous Exudate Color: amber Foul Odor After Cleansing: No Slough/Fibrino Yes Wound Bed Granulation Amount: Small (1-33%) Exposed Structure Granulation Quality: Red Fascia Exposed: No Necrotic Amount: Large (67-100%) Fat Layer (Subcutaneous Tissue) Exposed: Yes Necrotic Quality: Adherent Slough Tendon Exposed: No Muscle Exposed: No Joint Exposed: No Bone Exposed: No Periwound Skin Texture Texture Color No Abnormalities Noted: Yes No Abnormalities Noted: Yes Moisture Temperature / Pain No Abnormalities Noted: No Temperature: No  Abnormality Dry / Scaly: No Tenderness on PalpationChauncy Joseph, [name] ([id number]) [id number] Page 7 of 11 Maceration: No Treatment Notes Wound #1 (Toe Great) Wound Laterality: Dorsal, Left Cleanser Soap and Water Discharge Instruction: May shower and wash wound with dial antibacterial soap and water prior to dressing change. Wound Cleanser Discharge Instruction: Cleanse the wound with wound cleanser prior to applying a clean dressing using gauze sponges, not tissue or cotton balls. Peri-Wound Care Sween Lotion (Moisturizing lotion) Discharge Instruction: Apply moisturizing lotion as directed Topical Ketoconazole Cream 2% Discharge Instruction: Apply Ketoconazole as  directed Triamcinolone Discharge Instruction: Apply Triamcinolone as directed zinc Primary Dressing Maxorb Extra Ag+ Alginate Dressing, 4x4.75 (in/in) Discharge Instruction: Apply to wound bed as instructed Secondary Dressing ABD Pad, 8x10 Discharge Instruction: Apply over primary dressing as directed. Drawtex 4x4 in Discharge Instruction: Apply over primary dressing as directed. Woven Gauze Sponge, Non-Sterile 4x4 in Discharge Instruction: can use gauze between toes (if no desired) Zetuvit Plus 4x8 in Discharge Instruction: Apply over primary dressing as directed. Secured With Compression Wrap Urgo K2 Lite, (equivalent to a 3 layer) two layer compression system, regular Discharge Instruction: Apply Urgo K2 Lite as directed (alternative to 3 layer compression). Compression Stockings Add-Ons Electronic Signature(s) Signed: 10/09/2022 4:05:21 PM By: [name] Entered By: [name] on 10/09/2022 15:04:38 -------------------------------------------------------------------------------- Wound Assessment Details Patient Name: Date of Service: [name] [facility] RO N 10/09/2022 3:00 PM Medical Record Number: [id number] Patient Account Number: [id number] Date of Birth/Sex:  Treating RN: 04-Jul-1940 (83 y.o. [name] Primary Care Kayton Dunaj: [name], [facility] Other Clinician: Referring Shekelia Boutin: Treating Pranshu Lyster/Extender: [name] SHO [name], [facility] Weeks in Treatment: 17 Wound Status Wound Number: 6 Primary Etiology: Lymphedema Wound Location: Left, Dorsal Foot Wound Status: Open Wounding Event: Gradually Appeared Comorbid History: Anemia, Angina, Hypertension, Vasculitis, Osteoarthritis Date Acquired: 09/05/2022 [name], [name] ([id number]) [id number] Page 8 of 11 Weeks Of Treatment: 4 Clustered Wound: No Photos Wound Measurements Length: (cm) 10 Width: (cm) 5 Depth: (cm) 0.1 Area: (cm) 39.27 Volume: (cm) 3.927 % Reduction in Area: -1% % Reduction in Volume: -1% Epithelialization: Small (1-33%) Tunneling: No Undermining: No Wound Description Classification: Partial Thickness Wound Margin: Distinct, outline attached Exudate Amount: Large Exudate Type: Serous Exudate Color: amber Foul Odor After Cleansing: No Slough/Fibrino Yes Wound Bed Granulation Amount: Small (1-33%) Exposed Structure Granulation Quality: Red Fascia Exposed: No Necrotic Amount: Large (67-100%) Fat Layer (Subcutaneous Tissue) Exposed: Yes Necrotic Quality: Adherent Slough Tendon Exposed: No Muscle Exposed: No Joint Exposed: No Bone Exposed: No Periwound Skin Texture Texture Color No Abnormalities Noted: No No Abnormalities Noted: No Excoriation: Yes Rubor: Yes Moisture Temperature / Pain No Abnormalities Noted: No Temperature: No Abnormality Maceration: Yes Treatment Notes Wound #6 (Foot) Wound Laterality: Dorsal, Left Cleanser Soap and Water Discharge Instruction: May shower and wash wound with dial antibacterial soap and water prior to dressing change. Wound Cleanser Discharge Instruction: Cleanse the wound with wound cleanser prior to applying a clean dressing using gauze sponges, not tissue or cotton  balls. Peri-Wound Care Sween Lotion (Moisturizing lotion) Discharge Instruction: Apply moisturizing lotion as directed Topical Ketoconazole Cream 2% Discharge Instruction: Apply Ketoconazole as directed Triamcinolone Discharge Instruction: Apply Triamcinolone as directed zinc Primary Dressing Maxorb Extra Ag+ Alginate Dressing, 4x4.75 (in/in) Discharge Instruction: Apply to wound bed as instructed Ploeger, Jamya ([id number]) [id number] Page 9 of 11 Secondary Dressing ABD Pad, 8x10 Discharge Instruction: Apply over primary dressing as directed. Drawtex 4x4 in Discharge Instruction: Apply over primary dressing as directed. Woven Gauze Sponge, Non-Sterile 4x4 in Discharge Instruction: can use gauze between toes (if no desired) Zetuvit Plus 4x8 in Discharge Instruction: Apply over primary dressing as directed. Secured With Compression Wrap Urgo K2 Lite, (equivalent to a 3 layer) two layer compression system, regular Discharge Instruction: Apply Urgo K2 Lite as directed (alternative to 3 layer compression). Compression Stockings Add-Ons Electronic Signature(s) Signed: 10/09/2022 4:05:21 PM By: [name] Entered By: [name] on 10/09/2022 15:05:04 -------------------------------------------------------------------------------- Wound Assessment Details Patient Name: Date of Service: [name] [facility] RO N 10/09/2022 3:00 PM Medical Record Number: [id number] Patient Account Number: [id number] Date of  Birth/Sex: Treating RN: 1941-03-30 (81 y.o. [name] Primary Care Auriah Hollings: [name], [facility] Other Clinician: Referring Jasmon Mattice: Treating Katiya Fike/Extender: [name] SHO [name], [facility] Weeks in Treatment: 17 Wound Status Wound Number: 7 Primary Etiology: Atypical Wound Location: Left, Plantar T Fourth oe Wound Status: Open Wounding Event: Gradually Appeared Comorbid History: Anemia, Angina, Hypertension, Vasculitis,  Osteoarthritis Date Acquired: 10/03/2022 Weeks Of Treatment: 0 Clustered Wound: No Photos Wound Measurements Length: (cm) 0.3 Width: (cm) 0.2 Depth: (cm) 0.1 Area: (cm) 0.047 Volume: (cm) 0.005 % Reduction in Area: 33.8% % Reduction in Volume: 28.6% Epithelialization: None Tunneling: No Undermining: No Wound Description Classification: Full Thickness With Exposed Support Structures Wound Margin: Distinct, outline attached [name], [name] ([id number]) Exudate Amount: Medium Exudate Type: Serous Exudate Color: amber Foul Odor After Cleansing: No Slough/Fibrino Yes [id number] Page 10 of 11 Wound Bed Granulation Amount: Medium (34-66%) Exposed Structure Granulation Quality: Red Fascia Exposed: No Necrotic Amount: Medium (34-66%) Fat Layer (Subcutaneous Tissue) Exposed: Yes Necrotic Quality: Adherent Slough Tendon Exposed: No Muscle Exposed: No Joint Exposed: No Bone Exposed: Yes Periwound Skin Texture Texture Color No Abnormalities Noted: Yes No Abnormalities Noted: No Rubor: Yes Moisture No Abnormalities Noted: Yes Temperature / Pain Temperature: No Abnormality Treatment Notes Wound #7 (Toe Fourth) Wound Laterality: Plantar, Left Cleanser Soap and Water Discharge Instruction: May shower and wash wound with dial antibacterial soap and water prior to dressing change. Wound Cleanser Discharge Instruction: Cleanse the wound with wound cleanser prior to applying a clean dressing using gauze sponges, not tissue or cotton balls. Peri-Wound Care Sween Lotion (Moisturizing lotion) Discharge Instruction: Apply moisturizing lotion as directed Topical Ketoconazole Cream 2% Discharge Instruction: Apply Ketoconazole as directed Triamcinolone Discharge Instruction: Apply Triamcinolone as directed zinc Primary Dressing Endoform 2x2 in Discharge Instruction: Moisten with saline Secondary Dressing ABD Pad, 8x10 Discharge Instruction: Apply over  primary dressing as directed. Drawtex 4x4 in Discharge Instruction: Apply over primary dressing as directed. Woven Gauze Sponge, Non-Sterile 4x4 in Discharge Instruction: can use gauze between toes (if no desired) Zetuvit Plus 4x8 in Discharge Instruction: Apply over primary dressing as directed. Secured With Compression Wrap Urgo K2 Lite, (equivalent to a 3 layer) two layer compression system, regular Discharge Instruction: Apply Urgo K2 Lite as directed (alternative to 3 layer compression). Compression Stockings Add-Ons Electronic Signature(s) Signed: 10/09/2022 4:05:21 PM By: [name] Entered By: [name] on 10/09/2022 15:05:24 [name], [name] ([id number]) [id number] Page 11 of 11 -------------------------------------------------------------------------------- Vitals Details Patient Name: Date of Service: [name] 10/09/2022 3:00 PM Medical Record Number: [id number] Patient Account Number: [id number] Date of Birth/Sex: Treating RN: Sep 12, 1940 (82 y.o. [name] Primary Care Anshi Jalloh: [name], [facility] Other Clinician: Referring Josha Weekley: Treating Marisela Line/Extender: [name] SHO [name], [facility] Weeks in Treatment: 17 Vital Signs Time Taken: 14:51 Temperature (F): 98.1 Height (in): 67 Pulse (bpm): 79 Weight (lbs): 153 Respiratory Rate (breaths/min): 16 Body Mass Index (BMI): 24 Blood Pressure (mmHg): 118/78 Reference Range: 80 - 120 mg / dl Electronic Signature(s) Signed: 10/09/2022 4:05:21 PM By: [name] Entered By: [name] on 10/09/2022 14:53:34

## 2022-10-09 NOTE — Progress Notes (Signed)
AHMIRACLE, UPCHURCH ([id number]) [id number] Page 1 of 13 Visit Report for 10/09/2022 Chief Complaint Document Details Patient Name: Date of Service: [name] RO N 10/09/2022 3:00 PM Medical Record Number: [id number] Patient Account Number: [id number] Date of Birth/Sex: Treating RN: 1941-01-05 (82 y.o. F) Primary Care Provider: [name], [facility] Other Clinician: Referring Provider: Treating Provider/Extender: [name], [facility] Weeks in Treatment: 97 Information Obtained from: Patient Chief Complaint Patient seen for complaints of Non-Healing Wound. Electronic Signature(s) Signed: 10/09/2022 3:50:08 PM By: [name] MD FACS Entered By: [name] on 10/09/2022 15:50:07 -------------------------------------------------------------------------------- Debridement Details Patient Name: Date of Service: [name], [facility] RO N 10/09/2022 3:00 PM Medical Record Number: [id number] Patient Account Number: [id number] Date of Birth/Sex: Treating RN: Mar 18, 1941 (82 y.o. [name] Primary Care Provider: [name], [facility] Other Clinician: Referring Provider: Treating Provider/Extender: [name], [facility] Weeks in Treatment: 17 Debridement Performed for Assessment: Wound #6 Left,Dorsal Foot Performed By: Physician [name], MD Debridement Type: Debridement Level of Consciousness (Pre-procedure): Awake and Alert Pre-procedure Verification/Time Out Yes - 15:10 Taken: Start Time: 15:10 Pain Control: Lidocaine 4% T opical Solution Percent of Wound Bed Debrided: 30% T Area Debrided (cm): otal 11.78 Tissue and other material debrided: Non-Viable, Slough, Slough Level: Non-Viable Tissue Debridement Description: Selective/Open Wound Instrument: Curette Bleeding: Minimum Hemostasis Achieved: Pressure Response to Treatment: Procedure was tolerated well Level of Consciousness (Post- Awake and Alert procedure): Post  Debridement Measurements of Total Wound Length: (cm) 10 Width: (cm) 5 Depth: (cm) 0.1 Volume: (cm) 3.927 Character of Wound/Ulcer Post Debridement: Improved Post Procedure Diagnosis Same as Pre-procedure [name], [name] ([id number]) [phone].pdf Page 2 of 13 Notes scribed for Dr. Lady Gary by [name], RN Electronic Signature(s) Signed: 10/09/2022 3:57:36 PM By: [name] MD FACS Signed: 10/09/2022 4:05:21 PM By: [name] Entered By: [name] on 10/09/2022 15:11:05 -------------------------------------------------------------------------------- Debridement Details Patient Name: Date of Service: [name], [facility] RO N 10/09/2022 3:00 PM Medical Record Number: [id number] Patient Account Number: [id number] Date of Birth/Sex: Treating RN: 04-10-1941 (82 y.o. [name] Primary Care Provider: [name], [facility] Other Clinician: Referring Provider: Treating Provider/Extender: [name], [facility] Weeks in Treatment: 17 Debridement Performed for Assessment: Wound #1 Left,Dorsal T Great oe Performed By: Physician [name], MD Debridement Type: Debridement Level of Consciousness (Pre-procedure): Awake and Alert Pre-procedure Verification/Time Out Yes - 15:10 Taken: Start Time: 15:10 Pain Control: Lidocaine 4% T opical Solution Percent of Wound Bed Debrided: 100% T Area Debrided (cm): otal 0.08 Tissue and other material debrided: Non-Viable, Slough, Slough Level: Non-Viable Tissue Debridement Description: Selective/Open Wound Instrument: Curette Bleeding: Minimum Hemostasis Achieved: Pressure Response to Treatment: Procedure was tolerated well Level of Consciousness (Post- Awake and Alert procedure): Post Debridement Measurements of Total Wound Length: (cm) 0.2 Width: (cm) 0.5 Depth: (cm) 0.1 Volume: (cm) 0.008 Character of Wound/Ulcer Post Debridement: Improved Post Procedure Diagnosis Same as  Pre-procedure Notes scribed for Dr. Lady Gary by [name], RN Electronic Signature(s) Signed: 10/09/2022 3:57:36 PM By: [name] MD FACS Signed: 10/09/2022 4:05:21 PM By: [name] Entered By: [name] on 10/09/2022 15:11:39 -------------------------------------------------------------------------------- Debridement Details Patient Name: Date of Service: [name], [facility] RO N 10/09/2022 3:00 PM Medical Record Number: [id number] Patient Account Number: [id number] JISELLA, HERDA ([id number]) [id number] Page 3 of 13 Date of Birth/Sex: Treating RN: 22-Oct-1940 (82 y.o. [name] Primary Care Provider: [name], [facility] Other Clinician: Referring Provider: Treating Provider/Extender: [name] SHO [name], [facility] Weeks in Treatment: 17 Debridement Performed for Assessment: Wound #7  Left,Plantar T Fourth oe Performed By: Physician [name], MD Debridement Type: Debridement Level of Consciousness (Pre-procedure): Awake and Alert Pre-procedure Verification/Time Out Yes - 15:10 Taken: Start Time: 15:10 Pain Control: Lidocaine 4% T opical Solution Percent of Wound Bed Debrided: 100% T Area Debrided (cm): otal 0.05 Tissue and other material debrided: Non-Viable, Slough, Slough Level: Non-Viable Tissue Debridement Description: Selective/Open Wound Instrument: Curette Bleeding: Minimum Hemostasis Achieved: Pressure Response to Treatment: Procedure was tolerated well Level of Consciousness (Post- Awake and Alert procedure): Post Debridement Measurements of Total Wound Length: (cm) 0.3 Width: (cm) 0.2 Depth: (cm) 0.1 Volume: (cm) 0.005 Character of Wound/Ulcer Post Debridement: Improved Post Procedure Diagnosis Same as Pre-procedure Notes scribed for Dr. Lady Gary by [name], RN Electronic Signature(s) Signed: 10/09/2022 3:57:36 PM By: [name] MD FACS Signed: 10/09/2022 4:05:21 PM By:  [name] Entered By: [name] on 10/09/2022 15:12:17 -------------------------------------------------------------------------------- HPI Details Patient Name: Date of Service: [name], [facility] RO N 10/09/2022 3:00 PM Medical Record Number: [id number] Patient Account Number: [id number] Date of Birth/Sex: Treating RN: 07/16/1940 (82 y.o. F) Primary Care Provider: [name], [facility] Other Clinician: Referring Provider: Treating Provider/Extender: [name], [facility] Weeks in Treatment: 17 History of Present Illness HPI Description: ADMISSION 06/12/2022 This is an 82 year old woman with a history of CVA, crest syndrome, atrial fibrillation, rocker-bottom foot deformity. 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 She has been followed by podiatry for this and I am not entirely clear as to how she came to be referred here. She resides in an assisted living facility. It is not clear what they have been putting on her wound, but on intake, she was noted to have denuded skin on her medial third toe, as well as ulcers on her dorsal great toe and lateral great toe. There is slough accumulation on both of the toe ulcers. There was an odor noted at intake, but after her foot was washed, the odor dissipated. Her toes are folded on top of each other creating areas of abrasion and pockets for moisture collection, which seems to be the primary cause of her ulceration. 06/20/2022: The skin between her toes and on the ball of her foot is completely macerated. There has been more tissue breakdown. The wound on her great toe has some slough accumulation. 06/27/2022: No change to her wounds today. There has been no further deterioration, but no significant improvement. She was both hypotensive and bradycardic on intake. Kathryn, Joseph ([id number]) [id number] Page 4 of 13 07/11/2022: Today, her foot is completely  macerated. She reports that the wound care nurse actually soaked her foot and then applied foam, despite our specific orders to not use foam at all. She also is draining serous fluid from both legs and has 2+ pitting edema. She is on furosemide 20 mg twice a day. 07/19/2022: Once again, her foot is completely macerated. There has been further tissue breakdown to the second and third toes and she now has ulcers on the distal ends. The wounds on her medial and lateral great toe have thick slough accumulation. Apparently Xeroform was found between her toes on intake. Edema control is improved, but still not perfect. No overt drainage from her legs appreciated on exam today. 07/27/2022: She has less tissue maceration today. Edema control in her bilateral lower extremities is improved. Still with slough accumulation on all open wound surfaces. 08/08/2022: Significant improvement this week. She has very little tissue maceration and according to her aide,  there has been very little drainage from her legs. There is some slough accumulation on the medial great toe ulcer. Edema control is improved bilaterally. She is spending more time in her bed with her legs elevated and less in her wheelchair with her legs in a dependent position. 08/20/2022: The edema in her legs is now well-controlled with the use of the zinc Unna boots. Unfortunately, this seems to have resulted in more drainage coming from the open areas of her feet. They are a bit macerated but there has not been as much tissue breakdown secondary to moisture as we have seen on previous visits. 08/28/2022: The only remaining open wound in her foot is on the dorsal great toe. The improvement in the rest of the foot is quite dramatic, with no tissue maceration or breakdown. She has been elevating her legs and wearing compression wraps. Edema control is excellent and there has been no drainage from her legs. 09/05/2022: Unfortunately, the distal half of her  dorsal foot has broken down and has a layer of slough on the surface. This appears to be secondary to moisture accumulation. Her dressing was completely saturated. 09/20/2022: There has been further deterioration of her foot. The dressing that was applied was bizarre and involved Coflex foam wrapped around her foot to the ankle and then Kerlix and Coban over that to the knee. She fortunately did have silver alginate between her toes but the tissue breakdown from moisture is extensive. 09/25/2022: There has been massive improvement in her foot since last week. The maceration has decreased, edema control is markedly better, and the wounds are showing evidence of healing, as opposed to worsening. 10/03/2022: The wound on her great toe is much smaller with minimal slough accumulation. The dorsal foot is also improving. She has a new ulcer on the plantar surface of her left fourth toe, however, and bone is exposed. Edema control and tissue maceration continues to be significantly better now that we are doing all of the dressing care. 10/09/2022: Unfortunately, it seems that the patient has resumed her habit of sitting in her wheelchair with her legs in a dependent position and there has been a lot more drainage and maceration on her foot. The dorsal foot wounds have expanded and are deeper. She has a new wound on her second toe and although the initial wound on her great toe has healed, she has a new wound on the lateral aspect of her great toe, immediately adjacent to that on her second toe, suggesting these have been caused by friction of the 2 toes rubbing against each other. Her son is participating in this visit. Speaker phone. Electronic Signature(s) Signed: 10/09/2022 3:51:47 PM By: [name] MD FACS Entered By: [name] on 10/09/2022 15:51:46 -------------------------------------------------------------------------------- Physical Exam Details Patient Name: Date of Service: [name],  [facility] RO N 10/09/2022 3:00 PM Medical Record Number: [id number] Patient Account Number: [id number] Date of Birth/Sex: Treating RN: 09-22-1940 (82 y.o. F) Primary Care Provider: [name], [facility] Other Clinician: Referring Provider: Treating Provider/Extender: [name] SHO KES, [facility] Weeks in Treatment: 17 Constitutional . . . . no acute distress. Respiratory Normal work of breathing on room air. Notes 10/09/2022: There has been a lot more drainage and maceration on her foot. The dorsal foot wounds have expanded and are deeper. She has a new wound on her second toe and although the initial wound on her great toe has healed, she has a new wound on the lateral aspect of her great toe, immediately  adjacent to that on her second toe, suggesting these have been caused by friction of the 2 toes rubbing against each other. Electronic Signature(s) Signed: 10/09/2022 3:52:28 PM By: [name] MD FACS Entered By: [name] on 10/09/2022 15:52:28 Koons, [name] ([id number]) [id number] Page 5 of 13 -------------------------------------------------------------------------------- Physician Orders Details Patient Name: Date of Service: [name] 10/09/2022 3:00 PM Medical Record Number: [id number] Patient Account Number: [id number] Date of Birth/Sex: Treating RN: 1940-09-18 (82 y.o. 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 - Dr. Lady Gary - room 2 DO NOT CHANGE DRESSING - Return A Anesthetic (In clinic) Topical Lidocaine 4% applied to wound bed - USED in Clinic Bathing/ Shower/ Hygiene May shower with protection but do not get wound dressing(s) wet. Protect dressing(s) with water repellant cover (for example, large plastic bag) or a cast cover and may then take shower. Edema Control - Lymphedema / SCD / Other Elevate legs to the level of the heart or above for 30 minutes daily and/or when sitting for 3-4 times a day throughout the day. Avoid standing for long periods of time. Non Wound Condition Right Lower Extremity Other Non Wound Condition Orders/Instructions: - urgo lite or 3 layer compression wrap on right and left lower leg Home Health Wound #1 Left,Dorsal T Great oe Other Home Health Orders/Instructions: - [facility]. Wound Treatment Wound #1 - T Great oe Wound Laterality: Dorsal, Left Cleanser: Soap and Water 1 x Per Week/30 Days Discharge Instructions: May shower and wash wound with dial antibacterial soap and water prior to dressing change. Cleanser: Wound Cleanser 1 x Per Week/30 Days Discharge Instructions: Cleanse the wound with wound cleanser prior to applying a clean dressing using gauze sponges, not tissue or cotton balls. Peri-Wound Care: Sween Lotion (Moisturizing lotion) 1 x Per Week/30 Days Discharge Instructions: Apply moisturizing lotion as directed Topical: Ketoconazole Cream 2% 1 x Per Week/30 Days Discharge Instructions: Apply Ketoconazole as directed Topical: Triamcinolone 1 x Per Week/30 Days Discharge Instructions: Apply Triamcinolone as directed Topical: zinc 1 x Per Week/30 Days Prim Dressing: Maxorb Extra Ag+ Alginate Dressing, 4x4.75 (in/in) 1 x Per Week/30  Days ary Discharge Instructions: Apply to wound bed as instructed Secondary Dressing: ABD Pad, 8x10 1 x Per Week/30 Days Rupert, [name] ([id number]) [phone].pdf Page 6 of 13 Discharge Instructions: Apply over primary dressing as directed. Secondary Dressing: Drawtex 4x4 in 1 x Per Week/30 Days Discharge Instructions: Apply over primary dressing as directed. Secondary Dressing: Woven Gauze Sponge, Non-Sterile 4x4 in 1 x Per Week/30 Days Discharge Instructions: can use gauze between toes (if no desired) Secondary Dressing: Zetuvit Plus 4x8 in 1 x Per Week/30 Days Discharge Instructions: Apply over primary dressing as directed. Compression Wrap: Urgo K2 Lite, (equivalent to a 3 layer) two layer compression system, regular 1 x Per Week/30 Days Discharge Instructions: Apply Urgo K2 Lite as directed (alternative to 3 layer compression). Wound #6 - Foot Wound  Laterality: Dorsal, Left Cleanser: Soap and Water 1 x Per Week/30 Days Discharge Instructions: May shower and wash wound with dial antibacterial soap and water prior to dressing change. Cleanser: Wound Cleanser 1 x Per Week/30 Days Discharge Instructions: Cleanse the wound with wound cleanser prior to applying a clean dressing using gauze sponges, not tissue or cotton balls. Peri-Wound Care: Sween Lotion (Moisturizing lotion) 1 x Per Week/30 Days Discharge Instructions: Apply moisturizing lotion as directed Topical: Ketoconazole Cream 2% 1 x Per Week/30 Days Discharge Instructions: Apply Ketoconazole as directed Topical: Triamcinolone 1 x Per Week/30 Days Discharge Instructions: Apply Triamcinolone as directed Topical: zinc 1 x Per Week/30 Days Prim Dressing: Maxorb Extra Ag+ Alginate Dressing, 4x4.75 (in/in) 1 x Per Week/30 Days ary Discharge Instructions: Apply to wound bed as instructed Secondary Dressing: ABD Pad, 8x10 1 x Per Week/30 Days Discharge Instructions: Apply over primary dressing as  directed. Secondary Dressing: Drawtex 4x4 in 1 x Per Week/30 Days Discharge Instructions: Apply over primary dressing as directed. Secondary Dressing: Woven Gauze Sponge, Non-Sterile 4x4 in 1 x Per Week/30 Days Discharge Instructions: can use gauze between toes (if no desired) Secondary Dressing: Zetuvit Plus 4x8 in 1 x Per Week/30 Days Discharge Instructions: Apply over primary dressing as directed. Compression Wrap: Urgo K2 Lite, (equivalent to a 3 layer) two layer compression system, regular 1 x Per Week/30 Days Discharge Instructions: Apply Urgo K2 Lite as directed (alternative to 3 layer compression). Wound #7 - T Fourth oe Wound Laterality: Plantar, Left Cleanser: Soap and Water 1 x Per Week/30 Days Discharge Instructions: May shower and wash wound with dial antibacterial soap and water prior to dressing change. Cleanser: Wound Cleanser 1 x Per Week/30 Days Discharge Instructions: Cleanse the wound with wound cleanser prior to applying a clean dressing using gauze sponges, not tissue or cotton balls. Peri-Wound Care: Sween Lotion (Moisturizing lotion) 1 x Per Week/30 Days Discharge Instructions: Apply moisturizing lotion as directed Topical: Ketoconazole Cream 2% 1 x Per Week/30 Days Discharge Instructions: Apply Ketoconazole as directed Topical: Triamcinolone 1 x Per Week/30 Days Discharge Instructions: Apply Triamcinolone as directed Topical: zinc 1 x Per Week/30 Days Prim Dressing: Endoform 2x2 in 1 x Per Week/30 Days ary Discharge Instructions: Moisten with saline Secondary Dressing: ABD Pad, 8x10 1 x Per Week/30 Days Discharge Instructions: Apply over primary dressing as directed. Secondary Dressing: Drawtex 4x4 in 1 x Per Week/30 Days Discharge Instructions: Apply over primary dressing as directed. BRANDOLYN, PHANN ([id number]) [id number] Page 7 of 13 Secondary Dressing: Woven Gauze Sponge, Non-Sterile 4x4 in 1 x Per Week/30 Days Discharge  Instructions: can use gauze between toes (if no desired) Secondary Dressing: Zetuvit Plus 4x8 in 1 x Per Week/30 Days Discharge Instructions: Apply over primary dressing as directed. Compression Wrap: Urgo K2 Lite, (equivalent to a 3 layer) two layer compression system, regular 1 x Per Week/30 Days Discharge Instructions: Apply Urgo K2 Lite as directed (alternative to 3 layer compression). Consults Vascular - formal ABIs with waveforms due to nonhealing wounds to left foot and toes ICD10: L97.526 Patient Medications llergies: Sulfa (Sulfonamide Antibiotics) A Notifications Medication Indication Start End 10/09/2022 lidocaine DOSE topical 4 % cream - cream topical Electronic Signature(s) Signed: 10/09/2022 3:57:36 PM By: [name] MD FACS Entered By: [name] on 10/09/2022 15:53:49 -------------------------------------------------------------------------------- Problem List Details Patient Name: Date of Service: [name], [facility] RO N 10/09/2022 3:00 PM Medical Record Number: [id number] Patient Account Number: [id number] Date of Birth/Sex: Treating RN: [birth year] (82 y.o. F) Primary Care Provider: [name], [facility]  Other Clinician: Referring Provider: Treating Provider/Extender: [name], 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 06/12/2022 No Yes 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 atrial fibrillation 06/12/2022 No Yes Z79.01 Long term (current) use of anticoagulants 06/12/2022 No Yes Knoblock, Declan ([id number])  [phone].pdf Page 8 of 13 I89.0 Lymphedema, not elsewhere classified 08/20/2022 No Yes Inactive Problems ICD-10 Code Description Active Date Inactive Date L97.521 Non-pressure chronic ulcer of other part of left foot limited to breakdown of skin 06/12/2022 06/12/2022 Resolved Problems Electronic Signature(s) Signed: 10/09/2022 3:48:33 PM By: [name] MD FACS Entered By: [name] on 10/09/2022 15:48:33 -------------------------------------------------------------------------------- Progress Note Details Patient Name: Date of Service: [name], [facility] RO N 10/09/2022 3:00 PM Medical Record Number: [id number] Patient Account Number: [id number] Date of Birth/Sex: Treating RN: [birth year] (82 y.o. F) Primary Care Provider: [name], [facility] Other Clinician: Referring Provider: Treating Provider/Extender: [name], [facility] Weeks in Treatment: 17 Subjective Chief Complaint Information obtained from Patient Patient seen for complaints of Non-Healing Wound. History of Present Illness (HPI) ADMISSION 06/12/2022 This is an 82 year old woman with a history of CVA, crest syndrome, atrial fibrillation, rocker-bottom foot deformity. 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 She has been followed by podiatry for this and I am not entirely clear as to how she came to be referred here. She resides in an assisted living facility. It is not clear what they have been putting on her wound, but on intake, she was noted to have denuded skin on her medial third toe, as well as ulcers on her dorsal great toe and lateral great toe. There is slough accumulation on both of the toe ulcers. There was an odor noted at intake, but after her foot was washed, the odor dissipated. Her toes are folded on top of each other creating areas of abrasion and pockets for moisture collection, which seems to be the primary cause of her  ulceration. 06/20/2022: The skin between her toes and on the ball of her foot is completely macerated. There has been more tissue breakdown. The wound on her great toe has some slough accumulation. 06/27/2022: No change to her wounds today. There has been no further deterioration, but no significant improvement. She was both hypotensive and bradycardic on intake. 07/11/2022: Today, her foot is completely macerated. She reports that the wound care nurse actually soaked her foot and then applied foam, despite our specific orders to not use foam at all. She also is draining serous fluid from both legs and has 2+ pitting edema. She is on furosemide 20 mg twice a day. 07/19/2022: Once again, her foot is completely macerated. There has been further tissue breakdown to the second and third toes and she now has ulcers on the distal ends. The wounds on her medial and lateral great toe have thick slough accumulation. Apparently Xeroform was found between her toes on intake. Edema control is improved, but still not perfect. No overt drainage from her legs appreciated on exam today. 07/27/2022: She has less tissue maceration today. Edema control in her bilateral lower extremities is improved.  Still with slough accumulation on all open wound surfaces. 08/08/2022: Significant improvement this week. She has very little tissue maceration and according to her aide, there has been very little drainage from her legs. There is some slough accumulation on the medial great toe ulcer. Edema control is improved bilaterally. She is spending more time in her bed with her legs elevated and less in her wheelchair with her legs in a dependent position. 08/20/2022: The edema in her legs is now well-controlled with the use of the zinc Unna boots. Unfortunately, this seems to have resulted in more drainage coming from the open areas of her feet. They are a bit macerated but there has not been as much tissue breakdown secondary to moisture  as we have seen on previous visits. 08/28/2022: The only remaining open wound in her foot is on the dorsal great toe. The improvement in the rest of the foot is quite dramatic, with no tissue [name], [name] ([id number]) [phone].pdf Page 9 of 13 maceration or breakdown. She has been elevating her legs and wearing compression wraps. Edema control is excellent and there has been no drainage from her legs. 09/05/2022: Unfortunately, the distal half of her dorsal foot has broken down and has a layer of slough on the surface. This appears to be secondary to moisture accumulation. Her dressing was completely saturated. 09/20/2022: There has been further deterioration of her foot. The dressing that was applied was bizarre and involved Coflex foam wrapped around her foot to the ankle and then Kerlix and Coban over that to the knee. She fortunately did have silver alginate between her toes but the tissue breakdown from moisture is extensive. 09/25/2022: There has been massive improvement in her foot since last week. The maceration has decreased, edema control is markedly better, and the wounds are showing evidence of healing, as opposed to worsening. 10/03/2022: The wound on her great toe is much smaller with minimal slough accumulation. The dorsal foot is also improving. She has a new ulcer on the plantar surface of her left fourth toe, however, and bone is exposed. Edema control and tissue maceration continues to be significantly better now that we are doing all of the dressing care. 10/09/2022: Unfortunately, it seems that the patient has resumed her habit of sitting in her wheelchair with her legs in a dependent position and there has been a lot more drainage and maceration on her foot. The dorsal foot wounds have expanded and are deeper. She has a new wound on her second toe and although the initial wound on her great toe has healed, she has a new wound on the lateral aspect of her  great toe, immediately adjacent to that on her second toe, suggesting these have been caused by friction of the 2 toes rubbing against each other. Her son is participating in this visit. Speaker phone. Patient History Information obtained from Patient. Family History Unknown History. Social History Former smoker - smoked when she was young, Marital Status - Widowed, Alcohol Use - Never, Drug Use - No History, Caffeine Use - Daily. Medical History Hematologic/Lymphatic Patient has history of Anemia Cardiovascular Patient has history of Angina - a-fib, Hypertension, Vasculitis Endocrine Denies history of Type I Diabetes, Type II Diabetes Musculoskeletal Patient has history of Osteoarthritis Hospitalization/Surgery History - cholecystectomy. - leg surgery. - tonsillectomy. 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 Vitals Time Taken: 2:51 PM, Height: 67 in, Weight: 153 lbs,  BMI: 24, Temperature: 98.1 F, Pulse: 79 bpm, Respiratory Rate: 16 breaths/min, Blood Pressure: 118/78 mmHg. Respiratory Normal work of breathing on room air. General Notes: 10/09/2022: There has been a lot more drainage and maceration on her foot. The dorsal foot wounds have expanded and are deeper. She has a new wound on her second toe and although the initial wound on her great toe has healed, she has a new wound on the lateral aspect of her great toe, immediately adjacent to that on her second toe, suggesting these have been caused by friction of the 2 toes rubbing against each other. Integumentary (Hair, Skin) Wound #1 status is Open. Original cause of wound was Footwear Injury. The date acquired was: 05/24/2021. The wound has been in treatment 17 weeks. The wound is located on the Left,Dorsal T Great. The wound measures 0.2cm length x 0.5cm width x 0.1cm depth;  0.079cm^2 area and 0.008cm^3 volume. There oe is Fat Layer (Subcutaneous Tissue) exposed. There is no tunneling or undermining noted. There is a medium amount of serous drainage noted. The wound margin is flat and intact. There is small (1-33%) red granulation within the wound bed. There is a large (67-100%) amount of necrotic tissue within the wound bed including Adherent Slough. The periwound skin appearance had no abnormalities noted for texture. The periwound skin appearance had no abnormalities noted for color. The periwound skin appearance did not exhibit: Dry/Scaly, Maceration. Periwound temperature was noted as No Abnormality. The periwound has tenderness on palpation. Wound #6 status is Open. Original cause of wound was Gradually Appeared. The date acquired was: 09/05/2022. The wound has been in treatment 4 weeks. The [place], [name] ([id number]) [id number] Page 10 of 13 wound is located on the Left,Dorsal Foot. The wound measures 10cm length x 5cm width x 0.1cm depth; 39.27cm^2 area and 3.927cm^3 volume. There is Fat Layer (Subcutaneous Tissue) exposed. There is no tunneling or undermining noted. There is a large amount of serous drainage noted. The wound margin is distinct with the outline attached to the wound base. There is small (1-33%) red granulation within the wound bed. There is a large (67-100%) amount of necrotic tissue within the wound bed including Adherent Slough. The periwound skin appearance exhibited: Excoriation, Maceration, Rubor. Periwound temperature was noted as No Abnormality. Wound #7 status is Open. Original cause of wound was Gradually Appeared. The date acquired was: 10/03/2022. The wound is located on the Left,Plantar T oe Fourth. The wound measures 0.3cm length x 0.2cm width x 0.1cm depth; 0.047cm^2 area and 0.005cm^3 volume. There is bone and Fat Layer (Subcutaneous Tissue) exposed. There is no tunneling or undermining noted. There is  a medium amount of serous drainage noted. The wound margin is distinct with the outline attached to the wound base. There is medium (34-66%) red granulation within the wound bed. There is a medium (34-66%) amount of necrotic tissue within the wound bed including Adherent Slough. The periwound skin appearance had no abnormalities noted for texture. The periwound skin appearance had no abnormalities noted for moisture. The periwound skin appearance exhibited: Rubor. Periwound temperature was noted as No Abnormality. 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 There was a Selective/Open Wound Non-Viable Tissue Debridement oe with a total area of 0.08 sq cm performed by [name], MD. With the following instrument(s): Curette to remove Non-Viable tissue/material. Material removed includes [facility] after achieving pain control using Lidocaine 4% Topical Solution. No specimens were taken. A time out was conducted at 15:10, prior to the start of the procedure. A Minimum amount of bleeding was controlled with Pressure. The procedure was tolerated well. Post Debridement Measurements: 0.2cm length x 0.5cm width x 0.1cm depth; 0.008cm^3 volume. Character of Wound/Ulcer Post Debridement is improved. Post procedure Diagnosis Wound #1: Same as Pre-Procedure General Notes: scribed for Dr. Lady Gary by [name], RN. Wound #6 Pre-procedure diagnosis of Wound #6 is a Lymphedema located on the  Left,Dorsal Foot . There was a Selective/Open Wound Non-Viable Tissue Debridement with a total area of 11.78 sq cm performed by [name], MD. With the following instrument(s): Curette to remove Non-Viable tissue/material. Material removed includes [facility] after achieving pain control using Lidocaine 4% Topical Solution. No specimens were taken. A time out was conducted at 15:10, prior to the start of the procedure. A Minimum amount of bleeding was controlled with Pressure. The procedure was tolerated well. Post Debridement Measurements: 10cm length x 5cm width x 0.1cm depth; 3.927cm^3 volume. Character of Wound/Ulcer Post Debridement is improved. Post procedure Diagnosis Wound #6: Same as Pre-Procedure General Notes: scribed for Dr. Lady Gary by [name], RN. Pre-procedure diagnosis of Wound #6 is a Lymphedema located on the Left,Dorsal Foot . There was a Double Layer Compression Therapy Procedure by [name], RN. Post procedure Diagnosis Wound #6: Same as Pre-Procedure Wound #7 Pre-procedure diagnosis of Wound #7 is an Atypical located on the Left,Plantar T Fourth . There was a Selective/Open Wound Non-Viable Tissue oe Debridement with a total area of 0.05 sq cm performed by [name], MD. With the following instrument(s): Curette to remove Non-Viable tissue/material. Material removed includes [facility] after achieving pain control using Lidocaine 4% Topical Solution. No specimens were taken. A time out was conducted at 15:10, prior to the start of the procedure. A Minimum amount of bleeding was controlled with Pressure. The procedure was tolerated well. Post Debridement Measurements: 0.3cm length x 0.2cm width x 0.1cm depth; 0.005cm^3 volume. Character of Wound/Ulcer Post Debridement is improved. Post procedure Diagnosis Wound #7: Same as Pre-Procedure General Notes: scribed for Dr. Lady Gary by [name], RN. Plan Follow-up Appointments: Return Appointment in  1 week. - Dr. Lady Gary - room 2 DO NOT CHANGE DRESSING - Anesthetic: (In clinic) Topical Lidocaine 4% applied to wound bed - USED in Clinic Bathing/ Shower/ Hygiene: May shower with protection but do not get wound dressing(s) wet. Protect dressing(s) with water repellant cover (for example, large plastic bag) or a cast cover and may then take shower. Edema Control - Lymphedema / SCD / Other: Elevate legs to the level of the heart or above for 30 minutes daily and/or when sitting for 3-4 times a day throughout the day. Avoid standing for long periods of time. [name], [name] ([id number]) [id number] Page 11 of 13 Non Wound Condition: Other Non Wound Condition Orders/Instructions: - urgo lite or 3 layer compression wrap on right and left lower leg Home Health: Wound #1 Left,Dorsal T Great: oe Other Home Health Orders/Instructions: - [facility]. Consults ordered were: Vascular - formal ABIs with waveforms due to nonhealing wounds to left foot and toes ICD10: L97.526 The following medication(s) was prescribed:  lidocaine topical 4 % cream cream topical was prescribed at facility WOUND #1: - T Great Wound Laterality: Dorsal, Left oe Cleanser: Soap and Water 1 x Per Week/30 Days Discharge Instructions: May shower and wash wound with dial antibacterial soap and water prior to dressing change. Cleanser: Wound Cleanser 1 x Per Week/30 Days Discharge Instructions: Cleanse the wound with wound cleanser prior to applying a clean dressing using gauze sponges, not tissue or cotton balls. Peri-Wound Care: Sween Lotion (Moisturizing lotion) 1 x Per Week/30 Days Discharge Instructions: Apply moisturizing lotion as directed Topical: Ketoconazole Cream 2% 1 x Per Week/30 Days Discharge Instructions: Apply Ketoconazole as directed Topical: Triamcinolone 1 x Per Week/30 Days Discharge Instructions: Apply Triamcinolone as directed Topical: zinc 1 x Per Week/30 Days Prim  Dressing: Maxorb Extra Ag+ Alginate Dressing, 4x4.75 (in/in) 1 x Per Week/30 Days ary Discharge Instructions: Apply to wound bed as instructed Secondary Dressing: ABD Pad, 8x10 1 x Per Week/30 Days Discharge Instructions: Apply over primary dressing as directed. Secondary Dressing: Drawtex 4x4 in 1 x Per Week/30 Days Discharge Instructions: Apply over primary dressing as directed. Secondary Dressing: Woven Gauze Sponge, Non-Sterile 4x4 in 1 x Per Week/30 Days Discharge Instructions: can use gauze between toes (if no desired) Secondary Dressing: Zetuvit Plus 4x8 in 1 x Per Week/30 Days Discharge Instructions: Apply over primary dressing as directed. Com pression Wrap: Urgo K2 Lite, (equivalent to a 3 layer) two layer compression system, regular 1 x Per Week/30 Days Discharge Instructions: Apply Urgo K2 Lite as directed (alternative to 3 layer compression). WOUND #6: - Foot Wound Laterality: Dorsal, Left Cleanser: Soap and Water 1 x Per Week/30 Days Discharge Instructions: May shower and wash wound with dial antibacterial soap and water prior to dressing change. Cleanser: Wound Cleanser 1 x Per Week/30 Days Discharge Instructions: Cleanse the wound with wound cleanser prior to applying a clean dressing using gauze sponges, not tissue or cotton balls. Peri-Wound Care: Sween Lotion (Moisturizing lotion) 1 x Per Week/30 Days Discharge Instructions: Apply moisturizing lotion as directed Topical: Ketoconazole Cream 2% 1 x Per Week/30 Days Discharge Instructions: Apply Ketoconazole as directed Topical: Triamcinolone 1 x Per Week/30 Days Discharge Instructions: Apply Triamcinolone as directed Topical: zinc 1 x Per Week/30 Days Prim Dressing: Maxorb Extra Ag+ Alginate Dressing, 4x4.75 (in/in) 1 x Per Week/30 Days ary Discharge Instructions: Apply to wound bed as instructed Secondary Dressing: ABD Pad, 8x10 1 x Per Week/30 Days Discharge Instructions: Apply over primary dressing as  directed. Secondary Dressing: Drawtex 4x4 in 1 x Per Week/30 Days Discharge Instructions: Apply over primary dressing as directed. Secondary Dressing: Woven Gauze Sponge, Non-Sterile 4x4 in 1 x Per Week/30 Days Discharge Instructions: can use gauze between toes (if no desired) Secondary Dressing: Zetuvit Plus 4x8 in 1 x Per Week/30 Days Discharge Instructions: Apply over primary dressing as directed. Com pression Wrap: Urgo K2 Lite, (equivalent to a 3 layer) two layer compression system, regular 1 x Per Week/30 Days Discharge Instructions: Apply Urgo K2 Lite as directed (alternative to 3 layer compression). WOUND #7: - T Fourth Wound Laterality: Plantar, Left oe Cleanser: Soap and Water 1 x Per Week/30 Days Discharge Instructions: May shower and wash wound with dial antibacterial soap and water prior to dressing change. Cleanser: Wound Cleanser 1 x Per Week/30 Days Discharge Instructions: Cleanse the wound with wound cleanser prior to applying a clean dressing using gauze sponges, not tissue or cotton balls. Peri-Wound Care: Sween Lotion (Moisturizing lotion) 1 x Per Week/30 Days Discharge Instructions: Apply moisturizing  lotion as directed Topical: Ketoconazole Cream 2% 1 x Per Week/30 Days Discharge Instructions: Apply Ketoconazole as directed Topical: Triamcinolone 1 x Per Week/30 Days Discharge Instructions: Apply Triamcinolone as directed Topical: zinc 1 x Per Week/30 Days Prim Dressing: Endoform 2x2 in 1 x Per Week/30 Days ary Discharge Instructions: Moisten with saline Secondary Dressing: ABD Pad, 8x10 1 x Per Week/30 Days Discharge Instructions: Apply over primary dressing as directed. Secondary Dressing: Drawtex 4x4 in 1 x Per Week/30 Days Discharge Instructions: Apply over primary dressing as directed. Secondary Dressing: Woven Gauze Sponge, Non-Sterile 4x4 in 1 x Per Week/30 Days Discharge Instructions: can use gauze between toes (if no desired) Secondary Dressing: Zetuvit  Plus 4x8 in 1 x Per Week/30 Days Discharge Instructions: Apply over primary dressing as directed. Com pression Wrap: Urgo K2 Lite, (equivalent to a 3 layer) two layer compression system, regular 1 x Per Week/30 Days Discharge Instructions: Apply Urgo K2 Lite as directed (alternative to 3 layer compression). 10/09/2022: There has been a lot more drainage and maceration on her foot. The dorsal foot wounds have expanded and are deeper. She has a new wound on her second toe and although the initial wound on her great toe has healed, she has a new wound on the lateral aspect of her great toe, immediately adjacent to that on her second toe, suggesting these have been caused by friction of the 2 toes rubbing against each other. I used a curette to debride slough off of all of her wounds. The patient admitted that she has been sitting in her wheelchair with her legs dependent again I think this accounts for a lot of the deterioration we witnessed today. She was reminded once again of the importance of keeping her legs elevated. We will [name], [name] ([id number]) [id number] Page 12 of 13 continue using the mixture of zinc oxide, triamcinolone, and ketoconazole to the periwound. Continue silver alginate to the open wounds and between the toes. Continue 3 layer compression equivalent. We did discuss, with her sons participation, the alternative options including amputation. Obviously, we would need to establish adequate blood flow to heal an amputation so we are going to refer her for formal noninvasive vascular studies. She will follow-up here in 1 week. Electronic Signature(s) Signed: 10/09/2022 3:56:58 PM By: [name] MD FACS Entered By: [name] on 10/09/2022 15:56:58 -------------------------------------------------------------------------------- HxROS Details Patient Name: Date of Service: [place] [name], [facility] RO N 10/09/2022 3:00 PM Medical Record Number:  [id number] Patient Account Number: [id number] Date of Birth/Sex: Treating RN: 1940-12-26 (82 y.o. F) Primary Care Provider: [name], [facility] Other Clinician: Referring Provider: Treating Provider/Extender: [name], [facility] Weeks in Treatment: 17 Information Obtained From Patient Hematologic/Lymphatic Medical History: Positive for: Anemia Cardiovascular Medical History: Positive for: Angina - a-fib; Hypertension; 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 Endocrine Medical History: Negative for: Type I Diabetes; Type II Diabetes Past Medical History Notes: hypothyroidism Musculoskeletal Medical History: Positive for: 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 Neurologic Medical History: Past Medical History Notes: stroke Immunizations Pneumococcal Vaccine: Received Pneumococcal Vaccination: Yes Received Pneumococcal Vaccination On or After 60th Birthday: Yes Implantable Devices None Hospitalization / Surgery History Type of Hospitalization/Surgery cholecystectomy leg surgery tonsillectomy Family and Social History Kathryn, Joseph ([id number]) [id number] Page 13 of 13 Unknown History: Yes; Former smoker - smoked when she was young; Marital Status - Widowed; Alcohol Use: Never; Drug Use: No History; Caffeine Use: Daily; Financial Concerns: No; Food, Clothing or Shelter Needs: No; Support System Lacking: No; Transportation  Concerns: No Electronic Signature(s) Signed: 10/09/2022 3:57:36 PM By: [name] MD FACS Entered By: [name] on 10/09/2022 15:51:53 -------------------------------------------------------------------------------- SuperBill Details Patient Name: Date of Service: [name], [facility] RO N 10/09/2022 Medical Record Number: [id number] Patient Account Number: [id number] Date of Birth/Sex: Treating RN: [birth year] (82 y.o. F) Primary Care Provider: [name], [facility] Other  Clinician: Referring Provider: Treating Provider/Extender: [name], [facility] Weeks in Treatment: 17 Diagnosis Coding ICD-10 Codes Code Description [phone] Non-pressure chronic ulcer of other part of left foot with fat layer exposed L97.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 without evidence of necrosis I10 Essential (primary) hypertension 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 atrial fibrillation Z79.01 Long term (current) use of anticoagulants I89.0 Lymphedema, not elsewhere classified Facility Procedures : CPT4 Code: [id number] Description: 97597 - DEBRIDE WOUND 1ST 20 SQ CM OR < ICD-10 Diagnosis Description 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  witho Modifier: ut evidence of necr Quantity: 1 osis Physician Procedures : CPT4 Code Description Modifier [id number] - WC PHYS LEVEL 4 - EST PT 25 ICD-10 Diagnosis Description L97.522 Non-pressure chronic ulcer of other part of left foot with fat layer exposed L97.526 Non-pressure chronic ulcer of other part of left foot  with bone involvement without evidence of necro 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 I89.0 Lymphedema, not elsewhere classified Quantity: 1 sis : [id number] 97597 - WC PHYS DEBR WO ANESTH 20 SQ CM ICD-10 Diagnosis Description 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  without evidence of necro Quantity: 1 sis Electronic Signature(s) Signed: 10/09/2022 3:57:21 PM By: [name] MD FACS Entered By: [name] on 10/09/2022 15:57:20

## 2022-10-19 ENCOUNTER — Encounter (HOSPITAL_BASED_OUTPATIENT_CLINIC_OR_DEPARTMENT_OTHER): Payer: Medicare Other | Admitting: General Surgery

## 2022-10-19 DIAGNOSIS — I48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 atrial fibrillation: Secondary | ICD-10-CM | POA: Diagnosis not present

## 2022-10-22 NOTE — Progress Notes (Signed)
LAMIJA, DRUCKER ([id number]) [id number] Page 1 of 8 Visit Report for 10/19/2022 Arrival Information Details Patient Name: Date of Service: [name] RO N 10/19/2022 11:45 A M Medical Record Number: [id number] Patient Account Number: [id number] Date of Birth/Sex: Treating RN: Jun 15, 1940 (82 y.o. [name] Primary Care Shareen Capwell: [name], [facility] Other Clinician: Referring Goran Olden: Treating Ngai Parcell/Extender: [name] SHO [name], [facility] Weeks in Treatment: 18 Visit Information History Since Last Visit All ordered tests and consults were completed: Yes Patient Arrived: Ambulatory Added or deleted any medications: No Arrival Time: 12:06 Any new allergies or adverse reactions: No Accompanied By: self Had a fall or experienced change in No Transfer Assistance: None activities of daily living that may affect Patient Requires Transmission-Based Precautions: No risk of falls: Patient Has Alerts: No Signs or symptoms of abuse/neglect since last visito No Hospitalized since last visit: No Implantable device outside of the clinic excluding No cellular tissue based products placed in the center since last visit: Has Dressing in Place as Prescribed: Yes Pain Present Now: No Electronic Signature(s) Signed: 10/22/2022 2:57:52 PM By: Brenton Grills Entered By: Brenton Grills on 10/19/2022 12:08:05 -------------------------------------------------------------------------------- Compression Therapy Details Patient Name: Date of Service: [name], [facility] RO N 10/19/2022 11:45 A M Medical Record Number: [id number] Patient Account Number: [id number] Date of Birth/Sex: Treating RN: 05-Mar-1941 (82 y.o. [name] Primary Care Caroline Longie: [name], [facility] Other Clinician: Referring Leviathan Macera: Treating Ysabela Keisler/Extender: [name] SHO [name], [facility] Weeks in Treatment: 18 Compression Therapy Performed for Wound Assessment: Wound #1 Left,Dorsal T  Great oe Performed By: Leighton Parody, RN Compression Type: Three Layer Electronic Signature(s) Signed: 10/22/2022 2:57:52 PM By: Brenton Grills Entered By: Brenton Grills on 10/19/2022 12:30:57 -------------------------------------------------------------------------------- Compression Therapy Details Patient Name: Date of Service: [name] [facility] RO N 10/19/2022 11:45 A M Medical Record Number: [id number] Patient Account Number: [id number] Kathryn, Joseph ([id number]) [id number] Page 2 of 8 Date of Birth/Sex: Treating RN: May 13, 1940 (82 y.o. [name] Primary Care [name]: [name], [facility] Other Clinician: Referring Antionio Negron: Treating Rai Sinagra/Extender: [name] SHO [name], [facility] Weeks in Treatment: 18 Compression Therapy Performed for Wound Assessment: Wound #6 Left,Dorsal Foot Performed By: Leighton Parody, RN Compression Type: Three Layer Electronic Signature(s) Signed: 10/22/2022 2:57:52 PM By: Brenton Grills Entered By: Brenton Grills on 10/19/2022 12:30:57 -------------------------------------------------------------------------------- Compression Therapy Details Patient Name: Date of Service: [name] [facility] RO N 10/19/2022 11:45 A M Medical Record Number: [id number] Patient Account Number: [id number] Date of Birth/Sex: Treating RN: 10/26/1940 (82 y.o. [name] Primary Care [name]: [name], [facility] Other Clinician: Referring [name]: Treating Denitra Donaghey/Extender: [name] SHO [name], [facility] Weeks in Treatment: 18 Compression Therapy Performed for Wound Assessment: Wound #7 Left,Plantar T Fourth oe Performed By: Leighton Parody, RN Compression Type: Three Layer Electronic Signature(s) Signed: 10/22/2022 2:57:52 PM By: Brenton Grills Entered By: Brenton Grills on 10/19/2022 12:30:57 -------------------------------------------------------------------------------- Encounter Discharge Information  Details Patient Name: Date of Service: [name], [facility] RO N 10/19/2022 11:45 A M Medical Record Number: [id number] Patient Account Number: [id number] Date of Birth/Sex: Treating RN: 1940/09/14 (82 y.o. [name] Primary Care Jacolby Risby: [name], [facility] Other Clinician: Referring Myangel Summons: Treating Dorthie Santini/Extender: [name] SHO [name], [facility] Weeks in Treatment: 104 Encounter Discharge Information Items Discharge Condition: Stable Ambulatory Status: Wheelchair Discharge Destination: Home Transportation: Private Auto Accompanied By: self Schedule Follow-up Appointment: Yes Clinical Summary of Care: Patient Declined Electronic Signature(s) Signed: 10/22/2022 2:57:52 PM By: Brenton Grills Entered By: Brenton Grills on 10/19/2022 12:29:30 Leath, Jasmine Joseph ([id number]) [id number] Page  3 of 8 -------------------------------------------------------------------------------- Patient/Caregiver Education Details Patient Name: Date of Service: [name] [facility] RO [place] 6/28/2024andnbsp11:45 A M Medical Record Number: [id number] Patient Account Number: [id number] Date of Birth/Gender: Treating RN: 04/16/1941 (82 y.o. [name] Primary Care Physician: [name], [facility] Other Clinician: Referring Physician: Treating Physician/Extender: [name], [facility] Weeks in Treatment: 40 Education Assessment Education Provided To: Patient and Caregiver Education Topics Provided Wound/Skin Impairment: Methods: Explain/Verbal Responses: State content correctly Electronic Signature(s) Signed: 10/22/2022 2:57:52 PM By: Brenton Grills Entered By: Brenton Grills on 10/19/2022 12:29:08 -------------------------------------------------------------------------------- Wound Assessment Details Patient Name: Date of Service: [name] [facility] RO N 10/19/2022 11:45 A M Medical Record Number: [id number] Patient Account Number: [id number] Date of Birth/Sex: Treating  RN: 05-23-1940 (82 y.o. [name] Primary Care Eden Rho: [name], [facility] Other Clinician: Referring Emmaline Wahba: Treating Bianney Rockwood/Extender: [name] SHO KES, [facility] Weeks in Treatment: 18 Wound Status Wound Number: 1 Primary Etiology: Abrasion Wound Location: Left, Dorsal T Great oe Wound Status: Open Wounding Event: Footwear Injury Comorbid History: Anemia, Angina, Hypertension, Vasculitis, Osteoarthritis Date Acquired: 05/24/2021 Weeks Of Treatment: 18 Clustered Wound: No Wound Measurements Length: (cm) 0.2 Width: (cm) 0.5 Depth: (cm) 0.1 Area: (cm) 0.079 Volume: (cm) 0.008 % Reduction in Area: 82% % Reduction in Volume: 81.8% Epithelialization: Large (67-100%) Tunneling: No Undermining: No Wound Description Classification: 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 Support Wound Margin: Flat and Intact Exudate Amount: Medium Exudate Type: Serous Exudate Color: amber Structures Foul Odor After Cleansing: No Slough/Fibrino Yes Wound Bed Granulation Amount: Small (1-33%) Exposed Structure Granulation Quality: Red Fascia Exposed: No Leas, Abigayle ([id number]) [id number] Page 4 of 8 Necrotic Amount: Large (67-100%) Fat Layer (Subcutaneous Tissue) Exposed: Yes Necrotic Quality: Adherent Slough Tendon Exposed: No Muscle Exposed: No Joint Exposed: No Bone Exposed: No Periwound Skin Texture Texture Color No Abnormalities Noted: Yes No Abnormalities Noted: Yes Moisture Temperature / Pain No Abnormalities Noted: No Temperature: No Abnormality Dry / Scaly: No Tenderness on Palpation: Yes Maceration: No Treatment Notes Wound #1 (Toe Great) Wound Laterality: Dorsal, Left Cleanser Soap and Water Discharge Instruction: May shower and wash wound with dial antibacterial soap and water prior to dressing change. Wound Cleanser Discharge Instruction: Cleanse the wound with wound cleanser prior to applying a clean dressing using gauze sponges,  not tissue or cotton balls. Peri-Wound Care Sween Lotion (Moisturizing lotion) Discharge Instruction: Apply moisturizing lotion as directed Topical Ketoconazole Cream 2% Discharge Instruction: Apply Ketoconazole as directed Triamcinolone Discharge Instruction: Apply Triamcinolone as directed zinc Primary Dressing Maxorb Extra Ag+ Alginate Dressing, 4x4.75 (in/in) Discharge Instruction: Apply to wound bed as instructed Secondary Dressing ABD Pad, 8x10 Discharge Instruction: Apply over primary dressing as directed. Drawtex 4x4 in Discharge Instruction: Apply over primary dressing as directed. Woven Gauze Sponge, Non-Sterile 4x4 in Discharge Instruction: can use gauze between toes (if no desired) Zetuvit Plus 4x8 in Discharge Instruction: Apply over primary dressing as directed. Secured With Compression Wrap Urgo K2 Lite, (equivalent to a 3 layer) two layer compression system, regular Discharge Instruction: Apply Urgo K2 Lite as directed (alternative to 3 layer compression). Compression Stockings Add-Ons Electronic Signature(s) Signed: 10/22/2022 2:57:52 PM By: Brenton Grills Entered By: Brenton Grills on 10/19/2022 12:09:18 Marsh Dolly ([id number]) [id number] Page 5 of 8 -------------------------------------------------------------------------------- Wound Assessment Details Patient Name: Date of Service: [name] 10/19/2022 11:45 A M Medical Record Number: [id number] Patient Account Number: [id number] Date of Birth/Sex: Treating RN: 1940-08-15 (81 y.o. [name] Primary Care Maryanne Huneycutt: [name], [facility] Other Clinician: Referring Sarabeth Benton: Treating  Amyah Clawson/Extender: [name] SHO KES, WENDY Weeks in Treatment: 18 Wound Status Wound Number: 6 Primary Etiology: Lymphedema Wound Location: Left, Dorsal Foot Wound Status: Open Wounding Event: Gradually Appeared Comorbid History: Anemia, Angina, Hypertension, Vasculitis,  Osteoarthritis Date Acquired: 09/05/2022 Weeks Of Treatment: 6 Clustered Wound: No Wound Measurements Length: (cm) 10 Width: (cm) 5 Depth: (cm) 0.1 Area: (cm) 39.27 Volume: (cm) 3.927 % Reduction in Area: -1% % Reduction in Volume: -1% Epithelialization: Small (1-33%) Tunneling: No Undermining: No Wound Description Classification: Partial Thickness Wound Margin: Distinct, outline attached Exudate Amount: Large Exudate Type: Serous Exudate Color: amber Foul Odor After Cleansing: No Slough/Fibrino Yes Wound Bed Granulation Amount: Small (1-33%) Exposed Structure Granulation Quality: Red Fascia Exposed: No Necrotic Amount: Large (67-100%) Fat Layer (Subcutaneous Tissue) Exposed: Yes Necrotic Quality: Adherent Slough Tendon Exposed: No Muscle Exposed: No Joint Exposed: No Bone Exposed: No Periwound Skin Texture Texture Color No Abnormalities Noted: No No Abnormalities Noted: No Excoriation: Yes Rubor: Yes Moisture Temperature / Pain No Abnormalities Noted: No Temperature: No Abnormality Maceration: Yes Treatment Notes Wound #6 (Foot) Wound Laterality: Dorsal, Left Cleanser Soap and Water Discharge Instruction: May shower and wash wound with dial antibacterial soap and water prior to dressing change. Wound Cleanser Discharge Instruction: Cleanse the wound with wound cleanser prior to applying a clean dressing using gauze sponges, not tissue or cotton balls. Peri-Wound Care Sween Lotion (Moisturizing lotion) Discharge Instruction: Apply moisturizing lotion as directed Topical Ketoconazole Cream 2% Discharge Instruction: Apply Ketoconazole as directed Triamcinolone Discharge Instruction: Apply Triamcinolone as directed zinc Primary Dressing Maxorb Extra Ag+ Alginate Dressing, 4x4.75 (in/in) Ferreras, Safiya ([id number]) [id number] Page 6 of 8 Discharge Instruction: Apply to wound bed as instructed Secondary Dressing ABD Pad,  8x10 Discharge Instruction: Apply over primary dressing as directed. Drawtex 4x4 in Discharge Instruction: Apply over primary dressing as directed. Woven Gauze Sponge, Non-Sterile 4x4 in Discharge Instruction: can use gauze between toes (if no desired) Zetuvit Plus 4x8 in Discharge Instruction: Apply over primary dressing as directed. Secured With Compression Wrap Urgo K2 Lite, (equivalent to a 3 layer) two layer compression system, regular Discharge Instruction: Apply Urgo K2 Lite as directed (alternative to 3 layer compression). Compression Stockings Add-Ons Electronic Signature(s) Signed: 10/22/2022 2:57:52 PM By: Brenton Grills Entered By: Brenton Grills on 10/19/2022 12:09:26 -------------------------------------------------------------------------------- Wound Assessment Details Patient Name: Date of Service: [name] [facility] RO N 10/19/2022 11:45 A M Medical Record Number: [id number] Patient Account Number: [id number] Date of Birth/Sex: Treating RN: [birth year] (82 y.o. [name] Primary Care Jakeob Tullis: [name], [facility] Other Clinician: Referring Jerelle Virden: Treating Deaja Rizo/Extender: [name] SHO [name], [facility] Weeks in Treatment: 18 Wound Status Wound Number: 7 Primary Etiology: Atypical Wound Location: Left, Plantar T Fourth oe Wound Status: Open Wounding Event: Gradually Appeared Comorbid History: Anemia, Angina, Hypertension, Vasculitis, Osteoarthritis Date Acquired: 10/03/2022 Weeks Of Treatment: 2 Clustered Wound: No Wound Measurements Length: (cm) 0.3 Width: (cm) 0.2 Depth: (cm) 0.1 Area: (cm) 0.047 Volume: (cm) 0.005 % Reduction in Area: 33.8% % Reduction in Volume: 28.6% Epithelialization: None Tunneling: No Undermining: No Wound Description Classification: Full Thickness With Exposed Suppor Wound Margin: Distinct, outline attached Exudate Amount: Medium Exudate Type: Serous Exudate Color: amber t Structures Foul Odor After Cleansing:  No Slough/Fibrino Yes Wound Bed Granulation Amount: Medium (34-66%) Exposed Structure Granulation Quality: Red Fascia Exposed: No Necrotic Amount: Medium (34-66%) Fat Layer (Subcutaneous Tissue) Exposed: Yes Necrotic Quality: Adherent Slough Tendon Exposed: No Muscle Exposed: No Joint Exposed: No Bone Exposed: Yes Gloria, Vennela ([id number]) [id number] Page 7 of 8  Periwound Skin Texture Texture Color No Abnormalities Noted: Yes No Abnormalities Noted: No Rubor: Yes Moisture No Abnormalities Noted: Yes Temperature / Pain Temperature: No Abnormality Treatment Notes Wound #7 (Toe Fourth) Wound Laterality: Plantar, Left Cleanser Soap and Water Discharge Instruction: May shower and wash wound with dial antibacterial soap and water prior to dressing change. Wound Cleanser Discharge Instruction: Cleanse the wound with wound cleanser prior to applying a clean dressing using gauze sponges, not tissue or cotton balls. Peri-Wound Care Sween Lotion (Moisturizing lotion) Discharge Instruction: Apply moisturizing lotion as directed Topical Ketoconazole Cream 2% Discharge Instruction: Apply Ketoconazole as directed Triamcinolone Discharge Instruction: Apply Triamcinolone as directed zinc Primary Dressing Endoform 2x2 in Discharge Instruction: Moisten with saline Secondary Dressing ABD Pad, 8x10 Discharge Instruction: Apply over primary dressing as directed. Drawtex 4x4 in Discharge Instruction: Apply over primary dressing as directed. Woven Gauze Sponge, Non-Sterile 4x4 in Discharge Instruction: can use gauze between toes (if no desired) Zetuvit Plus 4x8 in Discharge Instruction: Apply over primary dressing as directed. Secured With Compression Wrap Urgo K2 Lite, (equivalent to a 3 layer) two layer compression system, regular Discharge Instruction: Apply Urgo K2 Lite as directed (alternative to 3 layer compression). Compression  Stockings Add-Ons Electronic Signature(s) Signed: 10/22/2022 2:57:52 PM By: Brenton Grills Entered By: Brenton Grills on 10/19/2022 12:09:34 -------------------------------------------------------------------------------- Vitals Details Patient Name: Date of Service: [name], [facility] RO N 10/19/2022 11:45 A M Medical Record Number: [id number] Patient Account Number: [id number] Date of Birth/Sex: Treating RN: 12/10/1940 (82 y.o. [name] Primary Care Kabria Hetzer: [name], [facility] Other Clinician: Referring Sydney Azure: Treating Aneyah Lortz/Extender: [name], [facility] [place], [place] ([id number]) [id number] Page 8 of 8 Weeks in Treatment: 18 Vital Signs Time Taken: 12:08 Temperature (F): 98.1 Height (in): 67 Pulse (bpm): 79 Weight (lbs): 153 Respiratory Rate (breaths/min): 16 Body Mass Index (BMI): 24 Blood Pressure (mmHg): 116/78 Reference Range: 80 - 120 mg / dl Electronic Signature(s) Signed: 10/22/2022 2:57:52 PM By: Brenton Grills Entered By: Brenton Grills on 10/19/2022 12:08:33

## 2022-10-22 NOTE — Progress Notes (Signed)
[name], [name] ([id number]) [id number] Page 1 of 1 Visit Report for 10/19/2022 SuperBill Details Patient Name: Date of Service: [name] 10/19/2022 Medical Record Number: [id number] Patient Account Number: [id number] Date of Birth/Sex: Treating RN: Dec 05, 1940 (82 y.o. 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

## 2022-10-24 ENCOUNTER — Encounter (HOSPITAL_BASED_OUTPATIENT_CLINIC_OR_DEPARTMENT_OTHER): Payer: Medicare Other | Attending: General Surgery | Admitting: General Surgery

## 2022-10-24 DIAGNOSIS — M341 CR(E)ST syndrome: Secondary | ICD-10-CM | POA: Diagnosis not present

## 2022-10-24 DIAGNOSIS — Z7901 Long term (current) use of anticoagulants: Secondary | ICD-10-CM | POA: Diagnosis not present

## 2022-10-24 DIAGNOSIS — Z87891 Personal history of nicotine dependence: Secondary | ICD-10-CM | POA: Insufficient documentation

## 2022-10-24 DIAGNOSIS — L97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 without evidence of necrosis: Secondary | ICD-10-CM | POA: Insufficient documentation

## 2022-10-24 DIAGNOSIS — L97522 Non-pressure chronic ulcer of other part of left foot with fat layer exposed: Secondary | ICD-10-CM | POA: Diagnosis present

## 2022-10-24 DIAGNOSIS — Z8673 Personal history of transient ischemic attack (TIA), and cerebral infarction without residual deficits: Secondary | ICD-10-CM | POA: Diagnosis not present

## 2022-10-24 DIAGNOSIS — I48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 atrial fibrillation: Secondary | ICD-10-CM | POA: Diagnosis not present

## 2022-10-24 DIAGNOSIS — I890 Lymphedema, not elsewhere classified: Secondary | ICD-10-CM | POA: Insufficient documentation

## 2022-10-24 DIAGNOSIS — I10 Essential (primary) hypertension: Secondary | ICD-10-CM | POA: Insufficient documentation

## 2022-10-24 NOTE — Progress Notes (Addendum)
DYNISHA, BADUA ([id number]) [id number] Page 1 of 16 Visit Report for 10/24/2022 Arrival Information Details Patient Name: Date of Service: [name] RO N 10/24/2022 3:00 PM Medical Record Number: [id number] Patient Account Number: [id number] Date of Birth/Sex: Treating RN: 1940-07-05 (82 y.o. [name] Primary Care Zachry Hopfensperger: [name], [facility] Other Clinician: Referring Baley Shands: Treating Dink Creps/Extender: [name], [facility] Weeks in Treatment: 51 Visit Information History Since Last Visit Added or deleted any medications: No Patient Arrived: Wheel Chair Any new allergies or adverse reactions: No Arrival Time: 14:52 Had a fall or experienced change in No Accompanied By: caregiver activities of daily living that may affect Transfer Assistance: Manual risk of falls: Patient Identification Verified: Yes Signs or symptoms of abuse/neglect since last visito No Secondary Verification Process Completed: Yes Hospitalized since last visit: No Patient Requires Transmission-Based Precautions: No Implantable device outside of the clinic excluding No Patient Has Alerts: No cellular tissue based products placed in the center since last visit: Has Dressing in Place as Prescribed: Yes Has Compression in Place as Prescribed: Yes Pain Present Now: No Electronic Signature(s) Signed: 10/24/2022 4:16:02 PM By: [name] Entered By: [name] on 10/24/2022 14:53:45 -------------------------------------------------------------------------------- Compression Therapy Details Patient Name: Date of Service: [name] [facility] RO N 10/24/2022 3:00 PM Medical Record Number: [id number] Patient Account Number: [id number] Date of Birth/Sex: Treating RN: 1940/05/19 (82 y.o. [name] Primary Care Iysis Germain: [name], [facility] Other Clinician: Referring Escarlet Saathoff: Treating Annaliesa Blann/Extender: [name] SHO [name], [facility] Weeks in  Treatment: 19 Compression Therapy Performed for Wound Assessment: Wound #8 Left,Lateral Foot Performed By: Clinician [name], RN Compression Type: Double Layer Post Procedure Diagnosis Same as Pre-procedure Electronic Signature(s) Signed: 10/24/2022 4:16:02 PM By: [name] Entered By: [name] on 10/24/2022 15:43:12 [name], [name] ([id number]) [id number] Page 2 of 16 -------------------------------------------------------------------------------- Encounter Discharge Information Details Patient Name: Date of Service: [name] 10/24/2022 3:00 PM Medical Record Number: [id number] Patient Account Number: [id number] Date of Birth/Sex: Treating RN: 1940/09/07 (82 y.o. [name] Primary Care Lillianah Swartzentruber: [name], [facility] Other Clinician: Referring Angelee Bahr: Treating Len Kluver/Extender: [name] SHO [name], [facility] Weeks in Treatment: 32 Encounter Discharge Information Items Post Procedure Vitals Discharge Condition: Stable Temperature (F): 97.6 Ambulatory Status: Wheelchair Pulse (bpm): 102 Discharge Destination: Skilled Nursing Facility Respiratory Rate (breaths/min): 18 Telephoned: No Blood Pressure (mmHg): 127/72 Orders Sent: Yes Transportation: Private Auto Accompanied By: caregiver Schedule Follow-up Appointment: Yes Clinical Summary of Care: Patient Declined Electronic Signature(s) Signed: 10/24/2022 4:16:02 PM By: [name] Entered By: [name] on 10/24/2022 16:07:48 -------------------------------------------------------------------------------- Lower Extremity Assessment Details Patient Name: Date of Service: [name] [facility] RO N 10/24/2022 3:00 PM Medical Record Number: [id number] Patient Account Number: [id number] Date of Birth/Sex: Treating RN: [birth year] (82 y.o. [name] Primary Care Crespin Forstrom: [name], [facility] Other Clinician: Referring Moniqua Engebretsen: Treating  Apryll Hinkle/Extender: [name] SHO KES, [facility] Weeks in Treatment: 19 Edema Assessment Assessed: [Left: No] [Right: No] Edema: [Left: Ye] [Right: s] Calf Left: Right: Point of Measurement: From Medial Instep 29.4 cm Ankle Left: Right: Point of Measurement: From Medial Instep 19.8 cm Vascular Assessment Pulses: Dorsalis Pedis Palpable: [Left:Yes] Electronic Signature(s) Signed: 10/24/2022 4:16:02 PM By: [name] Entered By: [name] on 10/24/2022 15:03:51 [name], [name] ([id number]) [id number] Page 3 of 16 -------------------------------------------------------------------------------- Multi Wound Chart Details Patient Name: Date of Service: [name] 10/24/2022 3:00 PM Medical Record Number: [id number] Patient Account Number: [id number] Date of Birth/Sex: Treating RN: Apr 26, 1940 (82 y.o. F) Primary Care Regie Bunner:  SHO KES, [facility] Other Clinician: Referring Dannilynn Gallina: Treating Arma Reining/Extender: [name] SHO [name], [facility] Weeks in Treatment: 30 Vital Signs Height(in): 67 Pulse(bpm): 102 Weight(lbs): 153 Blood Pressure(mmHg): 127/72 Body Mass Index(BMI): 24 Temperature(F): 97.6 Respiratory Rate(breaths/min): 18 [1:Photos:] Left, Dorsal T Great oe Left, Dorsal Foot Left, Plantar T Fourth oe Wound Location: Footwear Injury Gradually Appeared Gradually Appeared Wounding Event: Abrasion Lymphedema Atypical Primary Etiology: Anemia, Angina, Hypertension, Anemia, Angina, Hypertension, Anemia, Angina, Hypertension, Comorbid History: Vasculitis, Osteoarthritis Vasculitis, Osteoarthritis Vasculitis, Osteoarthritis 05/24/2021 09/05/2022 10/03/2022 Date Acquired: 19 7 3  Weeks of Treatment: Open Open Open Wound Status: No No No Wound Recurrence: 0.1x0.1x0.1 9.5x4.5x0.1 0.4x0.3x0.2 Measurements L x W x D (cm) 0.008 33.576 0.094 A (cm) : rea 0.001 3.358 0.019 Volume (cm) : 98.20% 13.60% -32.40% % Reduction in A  rea: 97.70% 13.60% -171.40% % Reduction in Volume: 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 Partial Thickness Full Thickness With Exposed Support Classification: Support Structures Structures None Present Large Medium Exudate A mount: N/A Serous Serous Exudate Type: N/A amber amber Exudate Color: Flat and Intact Distinct, outline attached Distinct, outline attached Wound Margin: None Present (0%) Small (1-33%) Medium (34-66%) Granulation A mount: N/A Red Red Granulation Quality: Large (67-100%) Large (67-100%) Medium (34-66%) Necrotic A mount: Eschar Adherent Slough Adherent Slough Necrotic Tissue: Fascia: No Fat Layer (Subcutaneous Tissue): Yes Fat Layer (Subcutaneous Tissue): Yes Exposed Structures: Fat Layer (Subcutaneous Tissue): No Fascia: No Bone: Yes Tendon: No Tendon: No Fascia: No Muscle: No Muscle: No Tendon: No Joint: No Joint: No Muscle: No Bone: No Bone: No Joint: No Large (67-100%) Small (1-33%) None Epithelialization: Debridement - Selective/Open Wound Debridement - Selective/Open Wound Debridement - Selective/Open Wound Debridement: Pre-procedure Verification/Time Out 15:39 15:39 15:39 Taken: Lidocaine 4% Topical Solution Lidocaine 4% Topical Solution Lidocaine 4% Topical Solution Pain Control: Principal [employer] Tissue Debrided: Non-Viable Tissue Non-Viable Tissue Non-Viable Tissue Level: 0.01 25.17 0.09 Debridement A (sq cm): rea Curette Curette Curette Instrument: Minimum Minimum Minimum Bleeding: Pressure Pressure Pressure Hemostasis A chieved: Procedure was tolerated well Procedure was tolerated well Procedure was tolerated well Debridement Treatment Response: 0.1x0.1x0.1 9.5x4.5x0.1 0.4x0.3x0.2 Post Debridement Measurements L x W x D (cm) 0.001 3.358 0.019 Post Debridement Volume: (cm) Scarring: Yes Excoriation: Yes No Abnormalities Noted Periwound Skin Texture: Maceration: No Maceration: Yes No Abnormalities Noted Periwound Skin  Moisture: Dry/Scaly: No Rubor: No Rubor: Yes Rubor: Yes Periwound Skin Color: No Abnormality No Abnormality No Abnormality Temperature: Debridement Debridement Debridement Procedures Performed: Wound Number: 8 N/A N/A PhotosKEVINA, [name] ([id number]) [id number] Page 4 of 16 Photos: N/A N/A Left, Lateral Foot N/A N/A Wound Location: Gradually Appeared N/A N/A Wounding Event: T be determined o N/A N/A Primary Etiology: Anemia, Angina, Hypertension, N/A N/A Comorbid History: Vasculitis, Osteoarthritis 10/24/2022 N/A N/A Date Acquired: 0 N/A N/A Weeks of Treatment: Open N/A N/A Wound Status: No N/A N/A Wound Recurrence: 0.5x0.5x0.2 N/A N/A Measurements L x W x D (cm) 0.196 N/A N/A A (cm) : rea 0.039 N/A N/A Volume (cm) : N/A N/A N/A % Reduction in A rea: N/A N/A N/A % Reduction in Volume: 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 N/A N/A Classification: Support Structures Medium N/A N/A Exudate A mount: Serosanguineous N/A N/A Exudate Type: red, brown N/A N/A Exudate Color: Distinct, outline attached N/A N/A Wound Margin: Small (1-33%) N/A N/A Granulation A mount: Red N/A N/A Granulation Quality: Large (67-100%) N/A N/A Necrotic A mount: Eschar, Adherent Slough N/A N/A Necrotic Tissue: Fat Layer (Subcutaneous Tissue): Yes N/A N/A Exposed Structures: Fascia: No Tendon: No Muscle: No Joint: No Bone:  No Small (1-33%) N/A N/A Epithelialization: Debridement - Excisional N/A N/A Debridement: Pre-procedure Verification/Time Out 15:39 N/A N/A Taken: Lidocaine 4% Topical Solution N/A N/A Pain Control: Subcutaneous, Slough N/A N/A Tissue Debrided: Skin/Subcutaneous Tissue N/A N/A Level: 0.2 N/A N/A Debridement A (sq cm): rea Curette N/A N/A Instrument: Minimum N/A N/A Bleeding: Pressure N/A N/A Hemostasis A chieved: Procedure was tolerated well N/A N/A Debridement Treatment Response: 0.5x0.5x0.2 N/A N/A Post Debridement  Measurements L x W x D (cm) 0.039 N/A N/A Post Debridement Volume: (cm) No Abnormalities Noted N/A N/A Periwound Skin Texture: Rubor: Yes N/A N/A Periwound Skin Color: No Abnormality N/A N/A Temperature: Compression Therapy N/A N/A Procedures Performed: Debridement Treatment Notes Wound #1 (Toe Great) Wound Laterality: Dorsal, Left Cleanser Soap and Water Discharge Instruction: May shower and wash wound with dial antibacterial soap and water prior to dressing change. Wound Cleanser Discharge Instruction: Cleanse the wound with wound cleanser prior to applying a clean dressing using gauze sponges, not tissue or cotton balls. Peri-Wound Care Sween Lotion (Moisturizing lotion) Discharge Instruction: Apply moisturizing lotion as directed Topical Gentamicin Discharge Instruction: As directed by physician Mupirocin Ointment Discharge Instruction: Apply Mupirocin (Bactroban) as instructed Ketoconazole Cream 2% [name], [name] ([id number]) [id number] Page 5 of 16 Discharge Instruction: Apply Ketoconazole as directed Triamcinolone Discharge Instruction: Apply Triamcinolone as directed zinc Primary Dressing Maxorb Extra Ag+ Alginate Dressing, 4x4.75 (in/in) Discharge Instruction: Apply to wound bed as instructed Secondary Dressing ABD Pad, 8x10 Discharge Instruction: Apply over primary dressing as directed. Drawtex 4x4 in Discharge Instruction: Apply over primary dressing as directed. Woven Gauze Sponge, Non-Sterile 4x4 in Discharge Instruction: can use gauze between toes (if no desired) Zetuvit Plus 4x8 in Discharge Instruction: Apply over primary dressing as directed. Secured With Compression Wrap Urgo K2 Lite, (equivalent to a 3 layer) two layer compression system, regular Discharge Instruction: Apply Urgo K2 Lite as directed (alternative to 3 layer compression). Compression Stockings Add-Ons Wound #6 (Foot) Wound Laterality: Dorsal,  Left Cleanser Soap and Water Discharge Instruction: May shower and wash wound with dial antibacterial soap and water prior to dressing change. Wound Cleanser Discharge Instruction: Cleanse the wound with wound cleanser prior to applying a clean dressing using gauze sponges, not tissue or cotton balls. Peri-Wound Care Sween Lotion (Moisturizing lotion) Discharge Instruction: Apply moisturizing lotion as directed Topical Gentamicin Discharge Instruction: As directed by physician Mupirocin Ointment Discharge Instruction: Apply Mupirocin (Bactroban) as instructed Ketoconazole Cream 2% Discharge Instruction: Apply Ketoconazole as directed Triamcinolone Discharge Instruction: Apply Triamcinolone as directed zinc Primary Dressing Maxorb Extra Ag+ Alginate Dressing, 4x4.75 (in/in) Discharge Instruction: Apply to wound bed as instructed Secondary Dressing ABD Pad, 8x10 Discharge Instruction: Apply over primary dressing as directed. Drawtex 4x4 in Discharge Instruction: Apply over primary dressing as directed. Woven Gauze Sponge, Non-Sterile 4x4 in Discharge Instruction: can use gauze between toes (if no desired) Zetuvit Plus 4x8 in Discharge Instruction: Apply over primary dressing as directed. Secured With Compression Wrap Urgo K2 Lite, (equivalent to a 3 layer) two layer compression system, regular Discharge Instruction: Apply Urgo K2 Lite as directed (alternative to 3 layer compression). [name], [name] ([id number]) [id number] Page 6 of 16 Compression Stockings Add-Ons Wound #7 (Toe Fourth) Wound Laterality: Plantar, Left Cleanser Soap and Water Discharge Instruction: May shower and wash wound with dial antibacterial soap and water prior to dressing change. Wound Cleanser Discharge Instruction: Cleanse the wound with wound cleanser prior to applying a clean dressing using gauze sponges, not tissue or cotton balls. Peri-Wound Care Sween Lotion  (Moisturizing lotion) Discharge Instruction:  Apply moisturizing lotion as directed Topical Gentamicin Discharge Instruction: As directed by physician Mupirocin Ointment Discharge Instruction: Apply Mupirocin (Bactroban) as instructed Ketoconazole Cream 2% Discharge Instruction: Apply Ketoconazole as directed Triamcinolone Discharge Instruction: Apply Triamcinolone as directed zinc Primary Dressing Endoform 2x2 in Discharge Instruction: Moisten with saline Secondary Dressing ABD Pad, 8x10 Discharge Instruction: Apply over primary dressing as directed. Drawtex 4x4 in Discharge Instruction: Apply over primary dressing as directed. Woven Gauze Sponge, Non-Sterile 4x4 in Discharge Instruction: can use gauze between toes (if no desired) Zetuvit Plus 4x8 in Discharge Instruction: Apply over primary dressing as directed. Secured With Compression Wrap Urgo K2 Lite, (equivalent to a 3 layer) two layer compression system, regular Discharge Instruction: Apply Urgo K2 Lite as directed (alternative to 3 layer compression). Compression Stockings Add-Ons Wound #8 (Foot) Wound Laterality: Left, Lateral Cleanser Soap and Water Discharge Instruction: May shower and wash wound with dial antibacterial soap and water prior to dressing change. Wound Cleanser Discharge Instruction: Cleanse the wound with wound cleanser prior to applying a clean dressing using gauze sponges, not tissue or cotton balls. Peri-Wound Care Sween Lotion (Moisturizing lotion) Discharge Instruction: Apply moisturizing lotion as directed Topical Gentamicin Discharge Instruction: As directed by physician Mupirocin Ointment Discharge Instruction: Apply Mupirocin (Bactroban) as instructed Ketoconazole Cream 2% [name], [name] ([id number]) [id number] Page 7 of 16 Discharge Instruction: Apply Ketoconazole as directed Triamcinolone Discharge Instruction: Apply Triamcinolone as  directed zinc Primary Dressing Maxorb Extra Ag+ Alginate Dressing, 4x4.75 (in/in) Discharge Instruction: Apply to wound bed as instructed Secondary Dressing ABD Pad, 8x10 Discharge Instruction: Apply over primary dressing as directed. Drawtex 4x4 in Discharge Instruction: Apply over primary dressing as directed. Woven Gauze Sponge, Non-Sterile 4x4 in Discharge Instruction: can use gauze between toes (if no desired) Zetuvit Plus 4x8 in Discharge Instruction: Apply over primary dressing as directed. Secured With Compression Wrap Urgo K2 Lite, (equivalent to a 3 layer) two layer compression system, regular Discharge Instruction: Apply Urgo K2 Lite as directed (alternative to 3 layer compression). Compression Stockings Add-Ons Electronic Signature(s) Signed: 10/24/2022 4:32:24 PM By: [name] MD FACS Entered By: [name] on 10/24/2022 16:32:23 -------------------------------------------------------------------------------- Multi-Disciplinary Care Plan Details Patient Name: Date of Service: [name], [facility] RO N 10/24/2022 3:00 PM Medical Record Number: [id number] Patient Account Number: [id number] Date of Birth/Sex: Treating RN: 24-Apr-1940 (82 y.o. [name] Primary Care Sterling Mondo: [name], [facility] Other Clinician: Referring Georjean Toya: Treating Kieran Arreguin/Extender: [name], [facility] Weeks in Treatment: 65 Active Inactive Necrotic Tissue Nursing Diagnoses: Impaired tissue integrity related to necrotic/devitalized tissue Knowledge deficit related to management of necrotic/devitalized tissue Goals: Necrotic/devitalized tissue will be minimized in the wound bed Date Initiated: 06/12/2022 Target Resolution Date: 12/21/2022 Goal Status: Active Patient/caregiver will verbalize understanding of reason and process for debridement of necrotic tissue Date Initiated: 06/12/2022 Target Resolution Date: 12/21/2022 Goal Status: Active Interventions: Assess  patient pain level pre-, during and post procedure and prior to discharge Provide education on necrotic tissue and debridement process Treatment Activities: CARESSE, HOLLOBAUGH ([id number]) [id number] Page 8 of 16 Apply topical anesthetic as ordered : 06/12/2022 Notes: Wound/Skin Impairment Nursing Diagnoses: Impaired tissue integrity Knowledge deficit related to ulceration/compromised skin integrity Goals: Patient/caregiver will verbalize understanding of skin care regimen Date Initiated: 06/12/2022 Target Resolution Date: 12/21/2022 Goal Status: Active Interventions: Assess ulceration(s) every visit Treatment Activities: Skin care regimen initiated : 06/12/2022 Topical wound management initiated : 06/12/2022 Notes: Electronic Signature(s) Signed: 10/24/2022 4:16:02 PM By: [name] Entered By: [name] on 10/24/2022 15:16:57 -------------------------------------------------------------------------------- Pain Assessment Details Patient Name:  Date of Service: [name] [facility] RO N 10/24/2022 3:00 PM Medical Record Number: [id number] Patient Account Number: [id number] Date of Birth/Sex: Treating RN: 26-Jul-1940 (83 y.o. [name] Primary Care Maryela Tapper: [name], [facility] Other Clinician: Referring Miela Desjardin: Treating Chayne Baumgart/Extender: [name] SHO [name], [facility] Weeks in Treatment: 29 Active Problems Location of Pain Severity and Description of Pain Patient Has Paino No Site Locations Rate the pain. Current Pain Level: 0 Pain Management and Medication Current Pain Management: Electronic Signature(s) Signed: 10/24/2022 4:16:02 PM By: [name] Entered By: [name] on 10/24/2022 14:53:52 [name], [name] ([id number]) [id number] Page 9 of 16 -------------------------------------------------------------------------------- Patient/Caregiver Education Details Patient Name: Date of  Service: [name] 7/3/2024andnbsp3:00 PM Medical Record Number: [id number] Patient Account Number: [id number] Date of Birth/Gender: Treating RN: 1940/07/25 (82 y.o. [name] Primary Care Physician: [name], [facility] Other Clinician: Referring Physician: Treating Physician/Extender: [name], [facility] Weeks in Treatment: 103 Education Assessment Education Provided To: Patient Education Topics Provided Wound/Skin Impairment: Methods: Explain/Verbal Responses: Reinforcements needed, State content correctly Electronic Signature(s) Signed: 10/24/2022 4:16:02 PM By: [name] Entered By: [name] on 10/24/2022 15:17:22 -------------------------------------------------------------------------------- Wound Assessment Details Patient Name: Date of Service: [name] [facility] RO N 10/24/2022 3:00 PM Medical Record Number: [id number] Patient Account Number: [id number] Date of Birth/Sex: Treating RN: 1940/05/18 (82 y.o. [name] Primary Care Jacci Ruberg: [name], [facility] Other Clinician: Referring Mikaila Grunert: Treating Maziah Keeling/Extender: [name] SHO KES, [facility] Weeks in Treatment: 19 Wound Status Wound Number: 1 Primary Etiology: Abrasion Wound Location: Left, Dorsal T Great oe Wound Status: Open Wounding Event: Footwear Injury Comorbid History: Anemia, Angina, Hypertension, Vasculitis, Osteoarthritis Date Acquired: 05/24/2021 Weeks Of Treatment: 19 Clustered Wound: No Photos Wound Measurements Soley, Kleo ([id number]) Length: (cm) 0.1 Width: (cm) 0.1 Depth: (cm) 0.1 Area: (cm) 0.008 Volume: (cm) 0.001 [id number] Page 10 of 16 % Reduction in Area: 98.2% % Reduction in Volume: 97.7% Epithelialization: Large (67-100%) Tunneling: No Undermining: No Wound Description Classification: 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 Support Structures Wound Margin: Flat and Intact Exudate Amount: None  Present Foul Odor After Cleansing: No Slough/Fibrino No Wound Bed Granulation Amount: None Present (0%) Exposed Structure Necrotic Amount: Large (67-100%) Fascia Exposed: No Necrotic Quality: Eschar Fat Layer (Subcutaneous Tissue) Exposed: No Tendon Exposed: No Muscle Exposed: No Joint Exposed: No Bone Exposed: No Periwound Skin Texture Texture Color No Abnormalities Noted: Yes No Abnormalities Noted: Yes Moisture Temperature / Pain No Abnormalities Noted: No Temperature: No Abnormality Dry / Scaly: No Maceration: No Treatment Notes Wound #1 (Toe Great) Wound Laterality: Dorsal, Left Cleanser Soap and Water Discharge Instruction: May shower and wash wound with dial antibacterial soap and water prior to dressing change. Wound Cleanser Discharge Instruction: Cleanse the wound with wound cleanser prior to applying a clean dressing using gauze sponges, not tissue or cotton balls. Peri-Wound Care Sween Lotion (Moisturizing lotion) Discharge Instruction: Apply moisturizing lotion as directed Topical Gentamicin Discharge Instruction: As directed by physician Mupirocin Ointment Discharge Instruction: Apply Mupirocin (Bactroban) as instructed Ketoconazole Cream 2% Discharge Instruction: Apply Ketoconazole as directed Triamcinolone Discharge Instruction: Apply Triamcinolone as directed zinc Primary Dressing Maxorb Extra Ag+ Alginate Dressing, 4x4.75 (in/in) Discharge Instruction: Apply to wound bed as instructed Secondary Dressing ABD Pad, 8x10 Discharge Instruction: Apply over primary dressing as directed. Drawtex 4x4 in Discharge Instruction: Apply over primary dressing as directed. Woven Gauze Sponge, Non-Sterile 4x4 in Discharge Instruction: can use gauze between toes (if no desired) Zetuvit Plus 4x8 in Discharge Instruction: Apply over primary dressing as directed.  Secured With Compression Wrap Urgo K2 Lite, (equivalent to a 3 layer) two layer compression system,  regular [name], [name] ([id number]) [phone].pdf Page 11 of 16 Discharge Instruction: Apply Urgo K2 Lite as directed (alternative to 3 layer compression). Compression Stockings Add-Ons Electronic Signature(s) Signed: 10/24/2022 4:16:02 PM By: [name] Entered By: [name] on 10/24/2022 15:11:52 -------------------------------------------------------------------------------- Wound Assessment Details Patient Name: Date of Service: [name] [facility] [place] RO N 10/24/2022 3:00 PM Medical Record Number: [id number] Patient Account Number: [id number] Date of Birth/Sex: Treating RN: April 06, 1941 (82 y.o. [name] Primary Care Tolbert Matheson: [name], [facility] Other Clinician: Referring Damaria Vachon: Treating Kenyanna Grzesiak/Extender: [name] SHO [name], [facility] Weeks in Treatment: 19 Wound Status Wound Number: 6 Primary Etiology: Lymphedema Wound Location: Left, Dorsal Foot Wound Status: Open Wounding Event: Gradually Appeared Comorbid History: Anemia, Angina, Hypertension, Vasculitis, Osteoarthritis Date Acquired: 09/05/2022 Weeks Of Treatment: 7 Clustered Wound: No Photos Wound Measurements Length: (cm) 9.5 Width: (cm) 4.5 Depth: (cm) 0.1 Area: (cm) 33.576 Volume: (cm) 3.358 % Reduction in Area: 13.6% % Reduction in Volume: 13.6% Epithelialization: Small (1-33%) Tunneling: No Undermining: No Wound Description Classification: Partial Thickness Wound Margin: Distinct, outline attached Exudate Amount: Large Exudate Type: Serous Exudate Color: amber Foul Odor After Cleansing: No Slough/Fibrino Yes Wound Bed Granulation Amount: Small (1-33%) Exposed Structure Granulation Quality: Red Fascia Exposed: No Necrotic Amount: Large (67-100%) Fat Layer (Subcutaneous Tissue) Exposed: Yes Necrotic Quality: Adherent Slough Tendon Exposed: No Muscle Exposed: No Joint Exposed: No Bone Exposed: No Periwound Skin Texture Texture Color [name], [name]  ([id number]) [id number].pdf Page 12 of 16 No Abnormalities Noted: No No Abnormalities Noted: No Excoriation: Yes Rubor: Yes Moisture Temperature / Pain No Abnormalities Noted: No Temperature: No Abnormality Maceration: Yes Treatment Notes Wound #6 (Foot) Wound Laterality: Dorsal, Left Cleanser Soap and Water Discharge Instruction: May shower and wash wound with dial antibacterial soap and water prior to dressing change. Wound Cleanser Discharge Instruction: Cleanse the wound with wound cleanser prior to applying a clean dressing using gauze sponges, not tissue or cotton balls. Peri-Wound Care Sween Lotion (Moisturizing lotion) Discharge Instruction: Apply moisturizing lotion as directed Topical Gentamicin Discharge Instruction: As directed by physician Mupirocin Ointment Discharge Instruction: Apply Mupirocin (Bactroban) as instructed Ketoconazole Cream 2% Discharge Instruction: Apply Ketoconazole as directed Triamcinolone Discharge Instruction: Apply Triamcinolone as directed zinc Primary Dressing Maxorb Extra Ag+ Alginate Dressing, 4x4.75 (in/in) Discharge Instruction: Apply to wound bed as instructed Secondary Dressing ABD Pad, 8x10 Discharge Instruction: Apply over primary dressing as directed. Drawtex 4x4 in Discharge Instruction: Apply over primary dressing as directed. Woven Gauze Sponge, Non-Sterile 4x4 in Discharge Instruction: can use gauze between toes (if no desired) Zetuvit Plus 4x8 in Discharge Instruction: Apply over primary dressing as directed. Secured With Compression Wrap Urgo K2 Lite, (equivalent to a 3 layer) two layer compression system, regular Discharge Instruction: Apply Urgo K2 Lite as directed (alternative to 3 layer compression). Compression Stockings Add-Ons Electronic Signature(s) Signed: 10/24/2022 4:16:02 PM By: [name] Entered By: [name] on 10/24/2022  15:12:27 -------------------------------------------------------------------------------- Wound Assessment Details Patient Name: Date of Service: [name] [facility] RO N 10/24/2022 3:00 PM Medical Record Number: [id number] Patient Account Number: [id number] Date of Birth/Sex: Treating RN: [birth year] (82 y.o. [name] [name], Jashiya ([id number]) [id number] Page 13 of 16 Primary Care Jolynne Spurgin: [name], [facility] Other Clinician: Referring Temika Sutphin: Treating Najib Colmenares/Extender: [name] SHO KES, [facility] Weeks in Treatment: 19 Wound Status Wound Number: 7 Primary Etiology: Atypical Wound Location: Left, Plantar T Fourth oe Wound Status: Open Wounding Event: Gradually Appeared Comorbid  History: Anemia, Angina, Hypertension, Vasculitis, Osteoarthritis Date Acquired: 10/03/2022 Weeks Of Treatment: 3 Clustered Wound: No Photos Wound Measurements Length: (cm) 0.4 Width: (cm) 0.3 Depth: (cm) 0.2 Area: (cm) 0.094 Volume: (cm) 0.019 % Reduction in Area: -32.4% % Reduction in Volume: -171.4% Epithelialization: None Tunneling: No Undermining: No Wound Description Classification: Full Thickness With Exposed Support Structures Wound Margin: Distinct, outline attached Exudate Amount: Medium Exudate Type: Serous Exudate Color: amber Foul Odor After Cleansing: No Slough/Fibrino Yes Wound Bed Granulation Amount: Medium (34-66%) Exposed Structure Granulation Quality: Red Fascia Exposed: No Necrotic Amount: Medium (34-66%) Fat Layer (Subcutaneous Tissue) Exposed: Yes Necrotic Quality: Adherent Slough Tendon Exposed: No Muscle Exposed: No Joint Exposed: No Bone Exposed: Yes Periwound Skin Texture Texture Color No Abnormalities Noted: Yes No Abnormalities Noted: No Rubor: Yes Moisture No Abnormalities Noted: Yes Temperature / Pain Temperature: No Abnormality Treatment Notes Wound #7 (Toe Fourth) Wound Laterality: Plantar, Left Cleanser Soap  and Water Discharge Instruction: May shower and wash wound with dial antibacterial soap and water prior to dressing change. Wound Cleanser Discharge Instruction: Cleanse the wound with wound cleanser prior to applying a clean dressing using gauze sponges, not tissue or cotton balls. Peri-Wound Care Sween Lotion (Moisturizing lotion) Discharge Instruction: Apply moisturizing lotion as directed Topical Gentamicin [name], [name] ([id number]) [phone].pdf Page 14 of 16 Discharge Instruction: As directed by physician Mupirocin Ointment Discharge Instruction: Apply Mupirocin (Bactroban) as instructed Ketoconazole Cream 2% Discharge Instruction: Apply Ketoconazole as directed Triamcinolone Discharge Instruction: Apply Triamcinolone as directed zinc Primary Dressing Endoform 2x2 in Discharge Instruction: Moisten with saline Secondary Dressing ABD Pad, 8x10 Discharge Instruction: Apply over primary dressing as directed. Drawtex 4x4 in Discharge Instruction: Apply over primary dressing as directed. Woven Gauze Sponge, Non-Sterile 4x4 in Discharge Instruction: can use gauze between toes (if no desired) Zetuvit Plus 4x8 in Discharge Instruction: Apply over primary dressing as directed. Secured With Compression Wrap Urgo K2 Lite, (equivalent to a 3 layer) two layer compression system, regular Discharge Instruction: Apply Urgo K2 Lite as directed (alternative to 3 layer compression). Compression Stockings Add-Ons Electronic Signature(s) Signed: 10/24/2022 4:16:02 PM By: [name] Entered By: [name] on 10/24/2022 15:12:49 -------------------------------------------------------------------------------- Wound Assessment Details Patient Name: Date of Service: [name] [facility] RO N 10/24/2022 3:00 PM Medical Record Number: [id number] Patient Account Number: [id number] Date of Birth/Sex: Treating RN: Nov 20, 1940 (82 y.o. [name] Primary  Care Azarius Lambson: [name], [facility] Other Clinician: Referring Mackenze Grandison: Treating Uchechukwu Dhawan/Extender: [name] SHO [name], [facility] Weeks in Treatment: 19 Wound Status Wound Number: 8 Primary Etiology: T be determined o Wound Location: Left, Lateral Foot Wound Status: Open Wounding Event: Gradually Appeared Comorbid History: Anemia, Angina, Hypertension, Vasculitis, Osteoarthritis Date Acquired: 10/24/2022 Weeks Of Treatment: 0 Clustered Wound: No Photos [name], [name] ([id number]) [id number] Page 15 of 16 Wound Measurements Length: (cm) 0.5 Width: (cm) 0.5 Depth: (cm) 0.2 Area: (cm) 0.196 Volume: (cm) 0.039 % Reduction in Area: % Reduction in Volume: Epithelialization: Small (1-33%) Tunneling: No Undermining: No Wound Description Classification: 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 Support Structures Wound Margin: Distinct, outline attached Exudate Amount: Medium Exudate Type: Serosanguineous Exudate Color: red, brown Foul Odor After Cleansing: No Slough/Fibrino Yes Wound Bed Granulation Amount: Small (1-33%) Exposed Structure Granulation Quality: Red Fascia Exposed: No Necrotic Amount: Large (67-100%) Fat Layer (Subcutaneous Tissue) Exposed: Yes Necrotic Quality: Eschar, Adherent Slough Tendon Exposed: No Muscle Exposed: No Joint Exposed: No Bone Exposed: No Periwound Skin Texture Texture Color No Abnormalities Noted: Yes No Abnormalities Noted: No Rubor: Yes Moisture No Abnormalities Noted: No Temperature /  Pain Temperature: No Abnormality Electronic Signature(s) Signed: 10/24/2022 4:16:02 PM By: [name] Entered By: [name] on 10/24/2022 15:13:18 -------------------------------------------------------------------------------- Vitals Details Patient Name: Date of Service: [place] [name], [facility] RO N 10/24/2022 3:00 PM Medical Record Number: [id number] Patient Account Number: [id number] Date of Birth/Sex: Treating RN: June 06, 1940  (82 y.o. [name] Primary Care Darius Fillingim: [name], [facility] Other Clinician: Referring Jamoni Broadfoot: Treating Lasean Rahming/Extender: [name] SHO [name], [facility] Weeks in Treatment: 41 Vital Signs Time Taken: 15:00 Temperature (F): 97.6 Height (in): 67 Pulse (bpm): 102 Weight (lbs): 153 Respiratory Rate (breaths/min): 18 Body Mass Index (BMI): 24 Blood Pressure (mmHg): 127/72 Reference Range: 80 - 120 mg / dl Cutright, Yicel ([id number]) [id number] Page 16 of 16 Electronic Signature(s) Signed: 10/24/2022 4:16:02 PM By: [name] Entered By: [name] on 10/24/2022 15:04:21

## 2022-10-24 NOTE — Progress Notes (Signed)
[name], [name] ([id number]) [id number] Page 1 of 16 Visit Report for 10/24/2022 Chief Complaint Document Details Patient Name: Date of Service: [name] RO N 10/24/2022 3:00 PM Medical Record Number: [id number] Patient Account Number: [id number] Date of Birth/Sex: Treating RN: July 23, 1940 (82 y.o. F) Primary Care Provider: [name], [facility] Other Clinician: Referring Provider: Treating Provider/Extender: [name], [facility] Weeks in Treatment: 38 Information Obtained from: Patient Chief Complaint Patient seen for complaints of Non-Healing Wound. Electronic Signature(s) Signed: 10/24/2022 4:32:37 PM By: [name] MD FACS Entered By: [name] on 10/24/2022 16:32:37 -------------------------------------------------------------------------------- Debridement Details Patient Name: Date of Service: [name], [facility] RO N 10/24/2022 3:00 PM Medical Record Number: [id number] Patient Account Number: [id number] Date of Birth/Sex: Treating RN: 04-16-1941 (82 y.o. [name] Primary Care Provider: [name], [facility] Other Clinician: Referring Provider: Treating Provider/Extender: [name], [facility] Weeks in Treatment: 19 Debridement Performed for Assessment: Wound #8 Left,Lateral Foot Performed By: Physician [name], MD Debridement Type: Debridement Level of Consciousness (Pre-procedure): Awake and Alert Pre-procedure Verification/Time Out Yes - 15:39 Taken: Start Time: 15:39 Pain Control: Lidocaine 4% T opical Solution Percent of Wound Bed Debrided: 100% T Area Debrided (cm): otal 0.2 Tissue and other material debrided: Non-Viable, Slough, Subcutaneous, Slough Level: Skin/Subcutaneous Tissue Debridement Description: Excisional Instrument: Curette Bleeding: Minimum Hemostasis Achieved: Pressure Response to Treatment: Procedure was tolerated well Level of Consciousness (Post- Awake and  Alert procedure): Post Debridement Measurements of Total Wound Length: (cm) 0.5 Width: (cm) 0.5 Depth: (cm) 0.2 Volume: (cm) 0.039 Character of Wound/Ulcer Post Debridement: Improved Post Procedure Diagnosis Same as Pre-procedure [name], [name] ([id number]) [id number] Page 2 of 16 Notes scribed for Dr. Lady Gary by [name], RN Electronic Signature(s) Signed: 10/24/2022 4:16:02 PM By: [name] Signed: 10/24/2022 4:41:42 PM By: [name] MD FACS Entered By: [name] on 10/24/2022 15:39:46 -------------------------------------------------------------------------------- Debridement Details Patient Name: Date of Service: [name], [facility] RO N 10/24/2022 3:00 PM Medical Record Number: [id number] Patient Account Number: [id number] Date of Birth/Sex: Treating RN: 01-May-1940 (82 y.o. [name] Primary Care Provider: [name], [facility] Other Clinician: Referring Provider: Treating Provider/Extender: [name], [facility] Weeks in Treatment: 19 Debridement Performed for Assessment: Wound #7 Left,Plantar T Fourth oe Performed By: Physician [name], MD Debridement Type: Debridement Level of Consciousness (Pre-procedure): Awake and Alert Pre-procedure Verification/Time Out Yes - 15:39 Taken: Start Time: 15:39 Pain Control: Lidocaine 4% T opical Solution Percent of Wound Bed Debrided: 100% T Area Debrided (cm): otal 0.09 Tissue and other material debrided: Non-Viable, Slough, Slough Level: Non-Viable Tissue Debridement Description: Selective/Open Wound Instrument: Curette Bleeding: Minimum Hemostasis Achieved: Pressure Response to Treatment: Procedure was tolerated well Level of Consciousness (Post- Awake and Alert procedure): Post Debridement Measurements of Total Wound Length: (cm) 0.4 Width: (cm) 0.3 Depth: (cm) 0.2 Volume: (cm) 0.019 Character of Wound/Ulcer Post Debridement: Improved Post  Procedure Diagnosis Same as Pre-procedure Notes scribed for Dr. Lady Gary by [name], RN Electronic Signature(s) Signed: 10/24/2022 4:16:02 PM By: [name] Signed: 10/24/2022 4:41:42 PM By: [name] MD FACS Entered By: [name] on 10/24/2022 15:40:39 -------------------------------------------------------------------------------- Debridement Details Patient Name: Date of Service: [name], [facility] RO N 10/24/2022 3:00 PM Medical Record Number: [id number] Patient Account Number: [id number] [name], [name] ([id number]) [id number] Page 3 of 16 Date of Birth/Sex: Treating RN: 04-26-1940 (82 y.o. [name] Primary Care Provider: [name], [facility] Other Clinician: Referring Provider: Treating Provider/Extender: [name] SHO [name], [facility] Weeks in Treatment: 19 Debridement Performed for Assessment: Wound #6  Left,Dorsal Foot Performed By: Physician [name], MD Debridement Type: Debridement Level of Consciousness (Pre-procedure): Awake and Alert Pre-procedure Verification/Time Out Yes - 15:39 Taken: Start Time: 15:39 Pain Control: Lidocaine 4% T opical Solution Percent of Wound Bed Debrided: 75% T Area Debrided (cm): otal 25.17 Tissue and other material debrided: Non-Viable, Slough, Slough Level: Non-Viable Tissue Debridement Description: Selective/Open Wound Instrument: Curette Bleeding: Minimum Hemostasis Achieved: Pressure Response to Treatment: Procedure was tolerated well Level of Consciousness (Post- Awake and Alert procedure): Post Debridement Measurements of Total Wound Length: (cm) 9.5 Width: (cm) 4.5 Depth: (cm) 0.1 Volume: (cm) 3.358 Character of Wound/Ulcer Post Debridement: Improved Post Procedure Diagnosis Same as Pre-procedure Notes scribed for Dr. Lady Gary by [name], RN Electronic Signature(s) Signed: 10/24/2022 4:16:02 PM By: [name] Signed: 10/24/2022 4:41:42 PM  By: [name] MD FACS Entered By: [name] on 10/24/2022 15:42:04 -------------------------------------------------------------------------------- Debridement Details Patient Name: Date of Service: [name], [facility] RO N 10/24/2022 3:00 PM Medical Record Number: [id number] Patient Account Number: [id number] Date of Birth/Sex: Treating RN: 1940-12-05 (82 y.o. [name] Primary Care Provider: [name], [facility] Other Clinician: Referring Provider: Treating Provider/Extender: [name], [facility] Weeks in Treatment: 19 Debridement Performed for Assessment: Wound #1 Left,Dorsal T Great oe Performed By: Physician [name], MD Debridement Type: Debridement Level of Consciousness (Pre-procedure): Awake and Alert Pre-procedure Verification/Time Out Yes - 15:39 Taken: Start Time: 15:39 Pain Control: Lidocaine 4% T opical Solution Percent of Wound Bed Debrided: 100% T Area Debrided (cm): otal 0.01 Tissue and other material debrided: Non-Viable, Slough, Slough Level: Non-Viable Tissue Debridement Description: Selective/Open Wound Instrument: Curette Bleeding: Minimum Hemostasis Achieved: Pressure Response to Treatment: Procedure was tolerated well Level of Consciousness [name], [name] ([id number]) [id number] Page 4 of 16 Level of Consciousness (Post- Awake and Alert procedure): Post Debridement Measurements of Total Wound Length: (cm) 0.1 Width: (cm) 0.1 Depth: (cm) 0.1 Volume: (cm) 0.001 Character of Wound/Ulcer Post Debridement: Improved Post Procedure Diagnosis Same as Pre-procedure Notes scribed for Dr. Lady Gary by [name], RN Electronic Signature(s) Signed: 10/24/2022 4:16:02 PM By: [name] Signed: 10/24/2022 4:41:42 PM By: [name] MD FACS Entered By: [name] on 10/24/2022  15:42:50 -------------------------------------------------------------------------------- HPI Details Patient Name: Date of Service: [name], [facility] RO N 10/24/2022 3:00 PM Medical Record Number: [id number] Patient Account Number: [id number] Date of Birth/Sex: Treating RN: May 01, 1940 (82 y.o. F) Primary Care Provider: [name], [facility] Other Clinician: Referring Provider: Treating Provider/Extender: [name], [facility] Weeks in Treatment: 32 History of Present Illness HPI Description: ADMISSION 06/12/2022 This is an 82 year old woman with a history of CVA, crest syndrome, atrial fibrillation, rocker-bottom foot deformity. 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 She has been followed by podiatry for this and I am not entirely clear as to how she came to be referred here. She resides in an assisted living facility. It is not clear what they have been putting on her wound, but on intake, she was noted to have denuded skin on her medial third toe, as well as ulcers on her dorsal great toe and lateral great toe. There is slough accumulation on both of the toe ulcers. There was an odor noted at intake, but after her foot was washed, the odor dissipated. Her toes are folded on top of each other creating areas of abrasion and pockets for moisture collection, which seems to be the primary cause of her ulceration. 06/20/2022: The skin between her toes and on  the ball of her foot is completely macerated. There has been more tissue breakdown. The wound on her great toe has some slough accumulation. 06/27/2022: No change to her wounds today. There has been no further deterioration, but no significant improvement. She was both hypotensive and bradycardic on intake. 07/11/2022: Today, her foot is completely macerated. She reports that the wound care nurse actually soaked her foot and then applied foam, despite our specific orders to not use foam at all. She  also is draining serous fluid from both legs and has 2+ pitting edema. She is on furosemide 20 mg twice a day. 07/19/2022: Once again, her foot is completely macerated. There has been further tissue breakdown to the second and third toes and she now has ulcers on the distal ends. The wounds on her medial and lateral great toe have thick slough accumulation. Apparently Xeroform was found between her toes on intake. Edema control is improved, but still not perfect. No overt drainage from her legs appreciated on exam today. 07/27/2022: She has less tissue maceration today. Edema control in her bilateral lower extremities is improved. Still with slough accumulation on all open wound surfaces. 08/08/2022: Significant improvement this week. She has very little tissue maceration and according to her aide, there has been very little drainage from her legs. There is some slough accumulation on the medial great toe ulcer. Edema control is improved bilaterally. She is spending more time in her bed with her legs elevated and less in her wheelchair with her legs in a dependent position. 08/20/2022: The edema in her legs is now well-controlled with the use of the zinc Unna boots. Unfortunately, this seems to have resulted in more drainage coming from the open areas of her feet. They are a bit macerated but there has not been as much tissue breakdown secondary to moisture as we have seen on previous visits. 08/28/2022: The only remaining open wound in her foot is on the dorsal great toe. The improvement in the rest of the foot is quite dramatic, with no tissue maceration or breakdown. She has been elevating her legs and wearing compression wraps. Edema control is excellent and there has been no drainage from her legs. 09/05/2022: Unfortunately, the distal half of her dorsal foot has broken down and has a layer of slough on the surface. This appears to be secondary to moisture accumulation. Her dressing was completely  saturated. 09/20/2022: There has been further deterioration of her foot. The dressing that was applied was bizarre and involved Coflex foam wrapped around her foot to the [place], Mammie ([id number]) [id number] Page 5 of 16 ankle and then Kerlix and Coban over that to the knee. She fortunately did have silver alginate between her toes but the tissue breakdown from moisture is extensive. 09/25/2022: There has been massive improvement in her foot since last week. The maceration has decreased, edema control is markedly better, and the wounds are showing evidence of healing, as opposed to worsening. 10/03/2022: The wound on her great toe is much smaller with minimal slough accumulation. The dorsal foot is also improving. She has a new ulcer on the plantar surface of her left fourth toe, however, and bone is exposed. Edema control and tissue maceration continues to be significantly better now that we are doing all of the dressing care. 10/09/2022: Unfortunately, it seems that the patient has resumed her habit of sitting in her wheelchair with her legs in a dependent position and there has been a lot more drainage and maceration on  her foot. The dorsal foot wounds have expanded and are deeper. She has a new wound on her second toe and although the initial wound on her great toe has healed, she has a new wound on the lateral aspect of her great toe, immediately adjacent to that on her second toe, suggesting these have been caused by friction of the 2 toes rubbing against each other. Her son is participating in this visit via speaker phone. 10/24/2022: Last week she had a nurse visit due to clinic capacity and it appears that her dressing was done differently by the nurse that saw her then how it is usually performed by her regular nurse. Unfortunately, this meant there was more moisture-related tissue breakdown. The dorsum of her foot is open again and she has a new wound on the  lateral aspect of her fifth toe. There is slough accumulation in each of the sites. Electronic Signature(s) Signed: 10/24/2022 4:34:08 PM By: [name] MD FACS Entered By: [name] on 10/24/2022 16:34:08 -------------------------------------------------------------------------------- Physical Exam Details Patient Name: Date of Service: [name], [facility] RO N 10/24/2022 3:00 PM Medical Record Number: [id number] Patient Account Number: [id number] Date of Birth/Sex: Treating RN: [birth year] (82 y.o. F) Primary Care Provider: [name], [facility] Other Clinician: Referring Provider: Treating Provider/Extender: [name] SHO KES, [facility] Weeks in Treatment: 19 Constitutional . . . . no acute distress. Respiratory Normal work of breathing on room air. Notes 10/24/2022: There was more moisture-related tissue breakdown appreciated today. The dorsum of her foot is open even more and she has a new wound on the lateral aspect of her fifth toe. There is slough accumulation in each of the sites. Electronic Signature(s) Signed: 10/24/2022 4:36:33 PM By: [name] MD FACS Entered By: [name] on 10/24/2022 16:36:33 -------------------------------------------------------------------------------- Physician Orders Details Patient Name: Date of Service: [name], [facility] RO N 10/24/2022 3:00 PM Medical Record Number: [id number] Patient Account Number: [id number] Date of Birth/Sex: Treating RN: 1941-01-09 (82 y.o. 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 - Dr. Lady Gary - room 2 DO NOT CHANGE DRESSING - Return A Anesthetic (In clinic) Topical Lidocaine 4% applied to wound bed - USED in Clinic Bathing/ Shower/ Hygiene May shower with protection but do not get wound dressing(s) wet. Protect dressing(s) with water repellant cover (for example, large plastic bag) or a cast cover and may then take shower. Edema Control - Lymphedema / SCD / Other Elevate legs to the level of the heart or above for 30 minutes daily and/or when sitting for 3-4 times a day throughout the day. Avoid standing for long periods of time. Non Wound Condition Right Lower Extremity Other Non Wound Condition Orders/Instructions: - urgo lite or 3 layer compression wrap on right and left lower leg Home Health Wound #1 Left,Dorsal T Great oe Other Home Health Orders/Instructions: - [facility]. Wound Treatment Wound #1 -  T Great oe Wound Laterality: Dorsal, Left Cleanser: Soap and Water 1 x Per Week/30 Days Discharge Instructions: May shower and wash wound with dial antibacterial soap and water prior to dressing change. Cleanser: Wound Cleanser 1 x Per Week/30 Days Discharge Instructions: Cleanse the wound with wound cleanser prior to applying a clean dressing using gauze sponges, not tissue or cotton balls. Peri-Wound Care: Sween Lotion (Moisturizing lotion) 1 x Per Week/30 Days Discharge Instructions: Apply moisturizing lotion as directed Topical: Gentamicin 1 x Per Week/30 Days Discharge Instructions: As directed by physician Topical: Mupirocin Ointment 1 x Per Week/30 Days Discharge  Instructions: Apply Mupirocin (Bactroban) as instructed Topical: Ketoconazole Cream 2% 1 x Per Week/30 Days Discharge Instructions: Apply Ketoconazole as directed Topical: Triamcinolone 1 x Per Week/30 Days Discharge Instructions: Apply Triamcinolone as directed Topical: zinc 1 x Per Week/30 Days Prim Dressing: Maxorb Extra Ag+ Alginate Dressing, 4x4.75 (in/in) 1 x Per Week/30 Days ary Discharge Instructions: Apply to wound bed as instructed Secondary Dressing: ABD Pad, 8x10 1 x Per Week/30 Days Discharge Instructions: Apply over primary dressing as directed. Secondary Dressing: Drawtex 4x4 in 1 x Per Week/30 Days Discharge Instructions: Apply over primary dressing as directed. Secondary Dressing: Woven Gauze Sponge, Non-Sterile 4x4 in 1 x Per Week/30 Days Discharge Instructions: can use gauze between toes (if no desired) Secondary Dressing: Zetuvit Plus 4x8 in 1 x Per Week/30 Days Discharge Instructions: Apply over primary dressing as directed. Compression Wrap: Urgo K2 Lite, (equivalent to a 3 layer) two layer compression system, regular 1 x Per Week/30 Days Discharge Instructions: Apply Urgo K2 Lite as directed (alternative to 3 layer compression). Wound #6 - Foot Wound Laterality: Dorsal, Left [place], [place] ([id number]) [id number].pdf Page 7 of 16 Cleanser: Soap and Water 1 x Per Week/30 Days Discharge Instructions: May shower and wash wound with dial antibacterial soap and water prior to dressing change. Cleanser: Wound Cleanser 1 x Per Week/30 Days Discharge Instructions: Cleanse the wound with wound cleanser prior to applying a clean dressing using gauze sponges, not tissue or cotton balls. Peri-Wound Care: Sween Lotion (Moisturizing lotion) 1 x Per Week/30 Days Discharge Instructions: Apply moisturizing lotion as directed Topical: Gentamicin 1 x Per Week/30 Days Discharge Instructions: As directed by physician Topical: Mupirocin Ointment 1 x Per Week/30  Days Discharge Instructions: Apply Mupirocin (Bactroban) as instructed Topical: Ketoconazole Cream 2% 1 x Per Week/30 Days Discharge Instructions: Apply Ketoconazole as directed Topical: Triamcinolone 1 x Per Week/30 Days Discharge Instructions: Apply Triamcinolone as directed Topical: zinc 1 x Per Week/30 Days Prim Dressing: Maxorb Extra Ag+ Alginate Dressing, 4x4.75 (in/in) 1 x Per Week/30 Days ary Discharge Instructions: Apply to wound bed as instructed Secondary Dressing: ABD Pad, 8x10 1 x Per Week/30 Days Discharge Instructions: Apply over primary dressing as directed. Secondary Dressing: Drawtex 4x4 in 1 x Per Week/30 Days Discharge Instructions: Apply over primary dressing as directed. Secondary Dressing: Woven Gauze Sponge, Non-Sterile 4x4 in 1 x Per Week/30 Days Discharge Instructions: can use gauze between toes (if no desired) Secondary Dressing: Zetuvit Plus 4x8 in 1 x Per Week/30 Days Discharge Instructions: Apply over primary dressing as directed. Compression Wrap: Urgo K2 Lite, (equivalent to a 3 layer) two layer compression system, regular 1 x Per Week/30 Days Discharge Instructions: Apply Urgo K2 Lite as directed (alternative to 3 layer compression). Wound #7 - T Fourth oe Wound Laterality: Plantar, Left Cleanser: Soap and Water 1 x Per Week/30 Days Discharge Instructions: May shower and wash wound with dial antibacterial soap and water prior  to dressing change. Cleanser: Wound Cleanser 1 x Per Week/30 Days Discharge Instructions: Cleanse the wound with wound cleanser prior to applying a clean dressing using gauze sponges, not tissue or cotton balls. Peri-Wound Care: Sween Lotion (Moisturizing lotion) 1 x Per Week/30 Days Discharge Instructions: Apply moisturizing lotion as directed Topical: Gentamicin 1 x Per Week/30 Days Discharge Instructions: As directed by physician Topical: Mupirocin Ointment 1 x Per Week/30 Days Discharge Instructions: Apply Mupirocin (Bactroban)  as instructed Topical: Ketoconazole Cream 2% 1 x Per Week/30 Days Discharge Instructions: Apply Ketoconazole as directed Topical: Triamcinolone 1 x Per Week/30 Days Discharge Instructions: Apply Triamcinolone as directed Topical: zinc 1 x Per Week/30 Days Prim Dressing: Endoform 2x2 in 1 x Per Week/30 Days ary Discharge Instructions: Moisten with saline Secondary Dressing: ABD Pad, 8x10 1 x Per Week/30 Days Discharge Instructions: Apply over primary dressing as directed. Secondary Dressing: Drawtex 4x4 in 1 x Per Week/30 Days Discharge Instructions: Apply over primary dressing as directed. Secondary Dressing: Woven Gauze Sponge, Non-Sterile 4x4 in 1 x Per Week/30 Days Discharge Instructions: can use gauze between toes (if no desired) Secondary Dressing: Zetuvit Plus 4x8 in 1 x Per Week/30 Days [place], [name] ([id number]) [phone].pdf Page 8 of 16 Discharge Instructions: Apply over primary dressing as directed. Compression Wrap: Urgo K2 Lite, (equivalent to a 3 layer) two layer compression system, regular 1 x Per Week/30 Days Discharge Instructions: Apply Urgo K2 Lite as directed (alternative to 3 layer compression). Wound #8 - Foot Wound Laterality: Left, Lateral Cleanser: Soap and Water 1 x Per Week/30 Days Discharge Instructions: May shower and wash wound with dial antibacterial soap and water prior to dressing change. Cleanser: Wound Cleanser 1 x Per Week/30 Days Discharge Instructions: Cleanse the wound with wound cleanser prior to applying a clean dressing using gauze sponges, not tissue or cotton balls. Peri-Wound Care: Sween Lotion (Moisturizing lotion) 1 x Per Week/30 Days Discharge Instructions: Apply moisturizing lotion as directed Topical: Gentamicin 1 x Per Week/30 Days Discharge Instructions: As directed by physician Topical: Mupirocin Ointment 1 x Per Week/30 Days Discharge Instructions: Apply Mupirocin (Bactroban) as instructed Topical:  Ketoconazole Cream 2% 1 x Per Week/30 Days Discharge Instructions: Apply Ketoconazole as directed Topical: Triamcinolone 1 x Per Week/30 Days Discharge Instructions: Apply Triamcinolone as directed Topical: zinc 1 x Per Week/30 Days Prim Dressing: Maxorb Extra Ag+ Alginate Dressing, 4x4.75 (in/in) 1 x Per Week/30 Days ary Discharge Instructions: Apply to wound bed as instructed Secondary Dressing: ABD Pad, 8x10 1 x Per Week/30 Days Discharge Instructions: Apply over primary dressing as directed. Secondary Dressing: Drawtex 4x4 in 1 x Per Week/30 Days Discharge Instructions: Apply over primary dressing as directed. Secondary Dressing: Woven Gauze Sponge, Non-Sterile 4x4 in 1 x Per Week/30 Days Discharge Instructions: can use gauze between toes (if no desired) Secondary Dressing: Zetuvit Plus 4x8 in 1 x Per Week/30 Days Discharge Instructions: Apply over primary dressing as directed. Compression Wrap: Urgo K2 Lite, (equivalent to a 3 layer) two layer compression system, regular 1 x Per Week/30 Days Discharge Instructions: Apply Urgo K2 Lite as directed (alternative to 3 layer compression). Patient Medications llergies: Sulfa (Sulfonamide Antibiotics) A Notifications Medication Indication Start End 10/24/2022 lidocaine DOSE topical 4 % cream - cream topical Electronic Signature(s) Signed: 10/24/2022 4:41:42 PM By: [name] MD FACS Previous Signature: 10/24/2022 4:16:02 PM Version By: [name] Entered By: [name] on 10/24/2022 16:37:10 -------------------------------------------------------------------------------- Problem List Details Patient Name: Date of Service: [name], [facility] RO N 10/24/2022 3:00 PM Medical Record Number: [id number]  Patient Account Number: [id number] Date of Birth/Sex: Treating RN: [birth year] (82 y.o. 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 10/03/2022 No Yes without evidence of necrosis I10 Essential (primary) hypertension 06/12/2022 No Yes 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 06/12/2022 No Yes 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 06/12/2022 No Yes 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 atrial fibrillation 06/12/2022 No Yes Z79.01 Long term (current) use of anticoagulants 06/12/2022 No Yes I89.0 Lymphedema, not elsewhere classified 08/20/2022 No Yes Inactive Problems ICD-10 Code Description Active Date Inactive Date L97.521 Non-pressure chronic ulcer of other part of left foot limited to breakdown of skin 06/12/2022 06/12/2022 Resolved Problems Electronic Signature(s) Signed: 10/24/2022 4:32:15 PM By: [name] MD FACS Entered By: [name] on 10/24/2022 16:32:15 -------------------------------------------------------------------------------- Progress Note Details Patient Name: Date of Service: [name], [facility] RO N 10/24/2022 3:00 PM Medical Record Number: [id number] Patient Account Number: [id number] Date of Birth/Sex: Treating RN: 1940/10/27 (82 y.o. F) Primary Care Provider: [name], [facility] Other Clinician: Referring Provider: Treating Provider/Extender: [name], [facility] Weeks in Treatment: [address] [place] ([id number]) [id number] Page 10 of 16 Subjective Chief Complaint Information obtained from Patient Patient seen for complaints of Non-Healing Wound. History of Present Illness (HPI) ADMISSION 06/12/2022 This is an 82 year old woman with a history of CVA, crest syndrome, atrial  fibrillation, rocker-bottom foot deformity. 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 She has been followed by podiatry for this and I am not entirely clear as to how she came to be referred here. She resides in an assisted living facility. It is not clear what they have been putting on her wound, but on intake, she was noted to have denuded skin on her medial third toe, as well as ulcers on her dorsal great toe and lateral great toe. There is slough accumulation on both of the toe ulcers. There was an odor noted at intake, but after her foot was washed, the odor dissipated. Her toes are folded on top of each other creating areas of abrasion and pockets for moisture collection, which seems to be the primary cause of her ulceration. 06/20/2022: The skin between her toes and on the ball of her foot is completely macerated. There has been more tissue breakdown. The wound on her great toe has some slough accumulation. 06/27/2022: No change to her wounds today. There has been no further deterioration, but no significant improvement. She was both hypotensive and bradycardic on intake. 07/11/2022: Today, her foot is completely macerated. She reports that the wound care nurse actually soaked her foot and then applied foam, despite our specific orders to not use foam at all. She also is draining serous fluid from both legs and has 2+ pitting edema. She is on furosemide 20 mg twice a day. 07/19/2022: Once again, her foot is completely macerated. There has been further tissue breakdown to the second and third toes and she now has ulcers on the distal ends. The wounds on her medial and lateral great toe have thick slough accumulation. Apparently Xeroform was found between her toes on intake. Edema control is improved, but still not  perfect. No overt drainage from her legs appreciated on exam today. 07/27/2022: She has less tissue maceration today. Edema control in her  bilateral lower extremities is improved. Still with slough accumulation on all open wound surfaces. 08/08/2022: Significant improvement this week. She has very little tissue maceration and according to her aide, there has been very little drainage from her legs. There is some slough accumulation on the medial great toe ulcer. Edema control is improved bilaterally. She is spending more time in her bed with her legs elevated and less in her wheelchair with her legs in a dependent position. 08/20/2022: The edema in her legs is now well-controlled with the use of the zinc Unna boots. Unfortunately, this seems to have resulted in more drainage coming from the open areas of her feet. They are a bit macerated but there has not been as much tissue breakdown secondary to moisture as we have seen on previous visits. 08/28/2022: The only remaining open wound in her foot is on the dorsal great toe. The improvement in the rest of the foot is quite dramatic, with no tissue maceration or breakdown. She has been elevating her legs and wearing compression wraps. Edema control is excellent and there has been no drainage from her legs. 09/05/2022: Unfortunately, the distal half of her dorsal foot has broken down and has a layer of slough on the surface. This appears to be secondary to moisture accumulation. Her dressing was completely saturated. 09/20/2022: There has been further deterioration of her foot. The dressing that was applied was bizarre and involved Coflex foam wrapped around her foot to the ankle and then Kerlix and Coban over that to the knee. She fortunately did have silver alginate between her toes but the tissue breakdown from moisture is extensive. 09/25/2022: There has been massive improvement in her foot since last week. The maceration has decreased, edema control is markedly better, and the wounds are showing evidence of healing, as opposed to worsening. 10/03/2022: The wound on her great toe is much  smaller with minimal slough accumulation. The dorsal foot is also improving. She has a new ulcer on the plantar surface of her left fourth toe, however, and bone is exposed. Edema control and tissue maceration continues to be significantly better now that we are doing all of the dressing care. 10/09/2022: Unfortunately, it seems that the patient has resumed her habit of sitting in her wheelchair with her legs in a dependent position and there has been a lot more drainage and maceration on her foot. The dorsal foot wounds have expanded and are deeper. She has a new wound on her second toe and although the initial wound on her great toe has healed, she has a new wound on the lateral aspect of her great toe, immediately adjacent to that on her second toe, suggesting these have been caused by friction of the 2 toes rubbing against each other. Her son is participating in this visit via speaker phone. 10/24/2022: Last week she had a nurse visit due to clinic capacity and it appears that her dressing was done differently by the nurse that saw her then how it is usually performed by her regular nurse. Unfortunately, this meant there was more moisture-related tissue breakdown. The dorsum of her foot is open again and she has a new wound on the lateral aspect of her fifth toe. There is slough accumulation in each of the sites. Patient History Information obtained from Patient. Family History Unknown History. Social History Former smoker - smoked  when she was young, Marital Status - Widowed, Alcohol Use - Never, Drug Use - No History, Caffeine Use - Daily. Medical History Hematologic/Lymphatic Patient has history of Anemia Cardiovascular Patient has history of Angina - a-fib, Hypertension, Vasculitis Endocrine Denies history of Type I Diabetes, Type II Diabetes Musculoskeletal Patient has history of Osteoarthritis Hospitalization/Surgery History - cholecystectomy. - leg surgery. - tonsillectomy. 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 Vitals Time Taken: 3:00 PM, Height: 67 in, Weight: 153 lbs, BMI: 24, Temperature: 97.6 F, Pulse: 102 bpm, Respiratory Rate: 18 breaths/min, Blood Pressure: 127/72 mmHg. Respiratory Normal work of breathing on room air. General Notes: 10/24/2022: There was more moisture-related tissue breakdown appreciated today. The dorsum of her foot is open even more and she has a new wound on the lateral aspect of her fifth toe. There is slough accumulation in each of the sites. Integumentary (Hair, Skin) Wound #1 status is Open. Original cause of wound was Footwear Injury. The date acquired was: 05/24/2021. The wound has been in treatment 19 weeks. The wound is located on the Left,Dorsal T Great. The wound measures 0.1cm length x 0.1cm width x 0.1cm depth; 0.008cm^2 area and 0.001cm^3 volume. There oe is no tunneling or undermining noted. There is a none present amount of drainage noted. The wound margin is flat and intact. There is no granulation within the wound bed. There is a large (67-100%) amount of necrotic tissue within the wound bed including Eschar. The periwound skin appearance had no abnormalities noted for texture. The periwound skin appearance had no abnormalities noted for color. The periwound skin appearance did not exhibit: Dry/Scaly, Maceration. Periwound temperature was noted as No Abnormality. Wound #6 status is Open. Original cause of wound was Gradually Appeared. The date acquired was: 09/05/2022. The wound has been in treatment 7 weeks. The wound is located on the Left,Dorsal Foot. The wound measures 9.5cm length x 4.5cm width x 0.1cm depth; 33.576cm^2 area and 3.358cm^3 volume. There is Fat Layer (Subcutaneous Tissue)  exposed. There is no tunneling or undermining noted. There is a large amount of serous drainage noted. The wound margin is distinct with the outline attached to the wound base. There is small (1-33%) red granulation within the wound bed. There is a large (67-100%) amount of necrotic tissue within the wound bed including Adherent Slough. The periwound skin appearance exhibited: Excoriation, Maceration, Rubor. Periwound temperature was noted as No Abnormality. Wound #7 status is Open. Original cause of wound was Gradually Appeared. The date acquired was: 10/03/2022. The wound has been in treatment 3 weeks. The wound is located on the Left,Plantar T Fourth. The wound measures 0.4cm length x 0.3cm width x 0.2cm depth; 0.094cm^2 area and 0.019cm^3 volume. oe There is bone and Fat Layer (Subcutaneous Tissue) exposed. There is no tunneling or undermining noted. There is a medium amount of serous drainage noted. The wound margin is distinct with the outline attached to the wound base. There is medium (34-66%) red granulation within the wound bed. There is a medium (34-66%) amount of necrotic tissue within the wound bed including Adherent Slough. The periwound skin appearance had no abnormalities noted for texture. The periwound skin appearance had no abnormalities noted for moisture. The periwound skin appearance exhibited: Rubor. Periwound temperature was noted as No Abnormality. Wound #8 status is Open. Original cause of wound was Gradually Appeared. The date acquired was:  10/24/2022. The wound is located on the Left,Lateral Foot. The wound measures 0.5cm length x 0.5cm width x 0.2cm depth; 0.196cm^2 area and 0.039cm^3 volume. There is Fat Layer (Subcutaneous Tissue) exposed. There is no tunneling or undermining noted. There is a medium amount of serosanguineous drainage noted. The wound margin is distinct with the outline attached to the wound base. There is small (1-33%) red granulation within the wound bed.  There is a large (67-100%) amount of necrotic tissue within the wound bed including Eschar and Adherent Slough. The periwound skin appearance had no abnormalities noted for texture. The periwound skin appearance exhibited: Rubor. Periwound temperature was noted as No Abnormality. 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 There was a Selective/Open Wound Non-Viable Tissue Debridement oe with a total area of 0.01 sq cm performed by [name], MD. With the following instrument(s): Curette to remove Non-Viable tissue/material. Material removed includes [facility] after achieving pain control using Lidocaine 4% Topical Solution. No specimens were taken. A time out was conducted at 15:39, prior to the start of the procedure. A Minimum amount of bleeding was controlled with Pressure. The procedure was tolerated well. Post Debridement Measurements: 0.1cm length x 0.1cm width x 0.1cm depth; 0.001cm^3 volume. Character of Wound/Ulcer Post Debridement is improved. Post procedure Diagnosis Wound #1: Same as Pre-Procedure General Notes: scribed for Dr. Lady Gary by [name], RN. Wound #6 Pre-procedure diagnosis of Wound #6 is a Lymphedema located on the Left,Dorsal Foot . There was a Selective/Open Wound Non-Viable Tissue Debridement with a total area of 25.17 sq cm performed by [name], MD. With  the following instrument(s): Curette to remove Non-Viable tissue/material. Material removed includes [facility] after achieving pain control using Lidocaine 4% Topical Solution. No specimens were taken. A time out was conducted at 15:39, prior to the start of the procedure. A Minimum amount of bleeding was controlled with Pressure. The procedure was tolerated well. Post Debridement Measurements: 9.5cm length x 4.5cm width x 0.1cm depth; 3.358cm^3 volume. Character of Wound/Ulcer Post Debridement is improved. Post procedure Diagnosis Wound #6: Same as Pre-Procedure General Notes: scribed for Dr. Lady Gary by [name], RN. Wound #7 Pre-procedure diagnosis of Wound #7 is an Atypical located on the Left,Plantar T Fourth . There was a Selective/Open Wound Non-Viable Tissue oe Debridement with a total area of 0.09 sq cm performed by [name], MD. With the following instrument(s): Curette to remove Non-Viable tissue/material. Material removed includes [facility] after achieving pain control using Lidocaine 4% Topical Solution. No specimens were taken. A time out was conducted at 15:39, prior to the start of the procedure. A Minimum amount of bleeding was controlled with Pressure. The procedure was tolerated well. Post Debridement Measurements: 0.4cm length x 0.3cm width x 0.2cm depth; 0.019cm^3 volume. Character of Wound/Ulcer Post Debridement is improved. Post procedure Diagnosis Wound #7: Same as Pre-Procedure General Notes: scribed for Dr. Lady Gary by [name], RN. Wound #8 Pre-procedure diagnosis of Wound #8 is a Pressure Ulcer located on the Left,Lateral Foot . There was a Excisional Skin/Subcutaneous Tissue Debridement  with a total area of 0.2 sq cm performed by [name], MD. With the following instrument(s): Curette to remove Non-Viable tissue/material. Material removed includes Subcutaneous Tissue and Slough and after achieving pain control using Lidocaine 4% T opical  Solution. No specimens were taken. A time out was conducted at 15:39, prior to the start of the procedure. A Minimum amount of bleeding was controlled with Pressure. The procedure was tolerated well. Post Debridement Measurements: 0.5cm length x 0.5cm width x 0.2cm depth; 0.039cm^3 volume. Character of Wound/Ulcer Post Debridement is improved. Post procedure Diagnosis Wound #8: Same as Pre-Procedure General Notes: scribed for Dr. Lady Gary by [name], RN. 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 There was a Double Layer Compression Therapy Procedure by [name], RN. Post procedure Diagnosis Wound #8: Same as Pre-Procedure Plan Follow-up Appointments: Return Appointment in 1 week. - Dr. Lady Gary - room 2 DO NOT CHANGE DRESSING - Anesthetic: (In clinic) Topical Lidocaine 4% applied to wound bed - USED in Clinic Bathing/ Shower/ Hygiene: May shower with protection but do not get wound dressing(s) wet. Protect dressing(s) with water repellant cover (for example, large plastic bag) or a cast cover and may then take shower. Edema Control - Lymphedema / SCD / Other: Elevate legs to the level of the heart or above for 30 minutes daily and/or when sitting for 3-4 times a day throughout the day. Avoid standing for long periods of time. Non Wound Condition: Other Non Wound Condition Orders/Instructions: - urgo lite or 3 layer compression wrap on right and left lower leg Home Health: Wound #1 Left,Dorsal T Great: oe Other Home Health Orders/Instructions: - [facility]. The following medication(s) was prescribed: lidocaine topical 4 % cream cream topical was prescribed at facility WOUND #1: - T Great Wound Laterality: Dorsal, Left oe Cleanser: Soap and Water 1 x Per Week/30 Days Discharge Instructions: May shower and wash wound with dial antibacterial soap and water prior to dressing change. Cleanser: Wound Cleanser 1 x Per  Week/30 Days Discharge Instructions: Cleanse the wound with wound cleanser prior to applying a clean dressing using gauze sponges, not tissue or cotton balls. Peri-Wound Care: Sween Lotion (Moisturizing lotion) 1 x Per Week/30 Days Discharge Instructions: Apply moisturizing lotion as directed Topical: Gentamicin 1 x Per Week/30 Days Discharge Instructions: As directed by physician Topical: Mupirocin Ointment 1 x Per Week/30 Days Discharge Instructions: Apply Mupirocin (Bactroban) as instructed Topical: Ketoconazole Cream 2% 1 x Per Week/30 Days Discharge Instructions: Apply Ketoconazole as directed Topical: Triamcinolone 1 x Per Week/30 Days Discharge Instructions: Apply Triamcinolone as directed Topical: zinc 1 x Per Week/30 Days Prim Dressing: Maxorb Extra Ag+ Alginate Dressing, 4x4.75 (in/in) 1 x Per Week/30 Days ary Discharge Instructions: Apply to wound bed as instructed Secondary Dressing: ABD Pad, 8x10 1 x Per Week/30 Days Discharge Instructions: Apply over primary dressing as directed. Secondary Dressing: Drawtex 4x4 in 1 x Per Week/30 Days Discharge Instructions: Apply over primary dressing as directed. Secondary Dressing: Woven Gauze Sponge, Non-Sterile 4x4 in 1 x Per Week/30 Days [name], [name] ([id number]) [phone].pdf Page 13 of 16 Discharge Instructions: can use gauze between toes (if no desired) Secondary Dressing: Zetuvit Plus 4x8 in 1 x Per Week/30 Days Discharge Instructions: Apply over primary dressing as directed. Com pression Wrap: Urgo K2 Lite, (equivalent to a 3 layer) two layer compression system, regular 1 x Per Week/30 Days Discharge Instructions: Apply Urgo K2 Lite as directed (alternative to 3 layer compression). WOUND #6: -  Foot Wound Laterality: Dorsal, Left Cleanser: Soap and Water 1 x Per Week/30 Days Discharge Instructions: May shower and wash wound with dial antibacterial soap and water prior to dressing change. Cleanser:  Wound Cleanser 1 x Per Week/30 Days Discharge Instructions: Cleanse the wound with wound cleanser prior to applying a clean dressing using gauze sponges, not tissue or cotton balls. Peri-Wound Care: Sween Lotion (Moisturizing lotion) 1 x Per Week/30 Days Discharge Instructions: Apply moisturizing lotion as directed Topical: Gentamicin 1 x Per Week/30 Days Discharge Instructions: As directed by physician Topical: Mupirocin Ointment 1 x Per Week/30 Days Discharge Instructions: Apply Mupirocin (Bactroban) as instructed Topical: Ketoconazole Cream 2% 1 x Per Week/30 Days Discharge Instructions: Apply Ketoconazole as directed Topical: Triamcinolone 1 x Per Week/30 Days Discharge Instructions: Apply Triamcinolone as directed Topical: zinc 1 x Per Week/30 Days Prim Dressing: Maxorb Extra Ag+ Alginate Dressing, 4x4.75 (in/in) 1 x Per Week/30 Days ary Discharge Instructions: Apply to wound bed as instructed Secondary Dressing: ABD Pad, 8x10 1 x Per Week/30 Days Discharge Instructions: Apply over primary dressing as directed. Secondary Dressing: Drawtex 4x4 in 1 x Per Week/30 Days Discharge Instructions: Apply over primary dressing as directed. Secondary Dressing: Woven Gauze Sponge, Non-Sterile 4x4 in 1 x Per Week/30 Days Discharge Instructions: can use gauze between toes (if no desired) Secondary Dressing: Zetuvit Plus 4x8 in 1 x Per Week/30 Days Discharge Instructions: Apply over primary dressing as directed. Com pression Wrap: Urgo K2 Lite, (equivalent to a 3 layer) two layer compression system, regular 1 x Per Week/30 Days Discharge Instructions: Apply Urgo K2 Lite as directed (alternative to 3 layer compression). WOUND #7: - T Fourth Wound Laterality: Plantar, Left oe Cleanser: Soap and Water 1 x Per Week/30 Days Discharge Instructions: May shower and wash wound with dial antibacterial soap and water prior to dressing change. Cleanser: Wound Cleanser 1 x Per Week/30 Days Discharge  Instructions: Cleanse the wound with wound cleanser prior to applying a clean dressing using gauze sponges, not tissue or cotton balls. Peri-Wound Care: Sween Lotion (Moisturizing lotion) 1 x Per Week/30 Days Discharge Instructions: Apply moisturizing lotion as directed Topical: Gentamicin 1 x Per Week/30 Days Discharge Instructions: As directed by physician Topical: Mupirocin Ointment 1 x Per Week/30 Days Discharge Instructions: Apply Mupirocin (Bactroban) as instructed Topical: Ketoconazole Cream 2% 1 x Per Week/30 Days Discharge Instructions: Apply Ketoconazole as directed Topical: Triamcinolone 1 x Per Week/30 Days Discharge Instructions: Apply Triamcinolone as directed Topical: zinc 1 x Per Week/30 Days Prim Dressing: Endoform 2x2 in 1 x Per Week/30 Days ary Discharge Instructions: Moisten with saline Secondary Dressing: ABD Pad, 8x10 1 x Per Week/30 Days Discharge Instructions: Apply over primary dressing as directed. Secondary Dressing: Drawtex 4x4 in 1 x Per Week/30 Days Discharge Instructions: Apply over primary dressing as directed. Secondary Dressing: Woven Gauze Sponge, Non-Sterile 4x4 in 1 x Per Week/30 Days Discharge Instructions: can use gauze between toes (if no desired) Secondary Dressing: Zetuvit Plus 4x8 in 1 x Per Week/30 Days Discharge Instructions: Apply over primary dressing as directed. Com pression Wrap: Urgo K2 Lite, (equivalent to a 3 layer) two layer compression system, regular 1 x Per Week/30 Days Discharge Instructions: Apply Urgo K2 Lite as directed (alternative to 3 layer compression). WOUND #8: - Foot Wound Laterality: Left, Lateral Cleanser: Soap and Water 1 x Per Week/30 Days Discharge Instructions: May shower and wash wound with dial antibacterial soap and water prior to dressing change. Cleanser: Wound Cleanser 1 x Per Week/30 Days Discharge Instructions: Cleanse the  wound with wound cleanser prior to applying a clean dressing using gauze sponges, not  tissue or cotton balls. Peri-Wound Care: Sween Lotion (Moisturizing lotion) 1 x Per Week/30 Days Discharge Instructions: Apply moisturizing lotion as directed Topical: Gentamicin 1 x Per Week/30 Days Discharge Instructions: As directed by physician Topical: Mupirocin Ointment 1 x Per Week/30 Days Discharge Instructions: Apply Mupirocin (Bactroban) as instructed Topical: Ketoconazole Cream 2% 1 x Per Week/30 Days Discharge Instructions: Apply Ketoconazole as directed Topical: Triamcinolone 1 x Per Week/30 Days Discharge Instructions: Apply Triamcinolone as directed Topical: zinc 1 x Per Week/30 Days Prim Dressing: Maxorb Extra Ag+ Alginate Dressing, 4x4.75 (in/in) 1 x Per Week/30 Days ary Discharge Instructions: Apply to wound bed as instructed Secondary Dressing: ABD Pad, 8x10 1 x Per Week/30 Days Discharge Instructions: Apply over primary dressing as directed. Secondary Dressing: Drawtex 4x4 in 1 x Per Week/30 Days Discharge Instructions: Apply over primary dressing as directed. Secondary Dressing: Woven Gauze Sponge, Non-Sterile 4x4 in 1 x Per Week/30 Days Discharge Instructions: can use gauze between toes (if no desired) Secondary Dressing: Zetuvit Plus 4x8 in 1 x Per Week/30 Days Discharge Instructions: Apply over primary dressing as directed. Com pression Wrap: Urgo K2 Lite, (equivalent to a 3 layer) two layer compression system, regular 1 x Per Week/30 Days Discharge Instructions: Apply Urgo K2 Lite as directed (alternative to 3 layer compression). [name], [name] ([id number]) [id number] Page 14 of 16 10/24/2022: There was more moisture-related tissue breakdown appreciated today. The dorsum of her foot is open even more and she has a new wound on the lateral aspect of her fifth toe. There is slough accumulation in each of the sites. I used a curette to debride slough from all of the pre-existing wounds and slough and subcutaneous tissue from the new wound  on the lateral aspect of her fifth toe. We are going to try and maintain consistency between the nurses that see this patient so that the dressing is done the same way each time. We will apply the mixture of triamcinolone, ketoconazole, and zinc oxide to her periwound. I am going to add topical gentamicin and mupirocin to each of the wound surfaces to try and eliminate any superficial colonization that is interfering with her ability to heal these wounds. Continue silver alginate to all of the sites, except we will continue Endoform to the fourth toe plantar surface wound. Continue 3 layer compression or equivalent. She does have an appointment in the vascular lab next week for formal arterial studies in case the ultimate decision for dealing with this issue ends up being amputation. She will follow-up in 1 week. Electronic Signature(s) Signed: 10/24/2022 4:40:52 PM By: [name] MD FACS Previous Signature: 10/24/2022 4:39:02 PM Version By: [name] MD FACS Entered By: [name] on 10/24/2022 16:40:52 -------------------------------------------------------------------------------- HxROS Details Patient Name: Date of Service: [name], [facility] RO N 10/24/2022 3:00 PM Medical Record Number: [id number] Patient Account Number: [id number] Date of Birth/Sex: Treating RN: 1940-09-18 (82 y.o. 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 Endocrine Medical History: Negative for: Type I Diabetes; Type II Diabetes Past Medical History Notes: hypothyroidism Musculoskeletal Medical History: Positive for: Osteoarthritis Past Medical History  Notes: crest syndrome Neurologic Medical History: Past Medical History Notes: stroke Immunizations Pneumococcal Vaccine: Received Pneumococcal  Vaccination: Yes Received Pneumococcal Vaccination On or After 60th Birthday: Yes Implantable Devices None [place], [place] ([id number]) [id number] Page 15 of 16 Hospitalization / Surgery History Type of Hospitalization/Surgery cholecystectomy leg surgery tonsillectomy Family and Social History Unknown History: Yes; Former smoker - smoked when she was young; Marital Status - Widowed; Alcohol Use: Never; Drug Use: No History; Caffeine Use: Daily; Financial Concerns: No; Food, Clothing or Shelter Needs: No; Support System Lacking: No; Transportation Concerns: No Electronic Signature(s) Signed: 10/24/2022 4:41:42 PM By: [name] MD FACS Entered By: [name] on 10/24/2022 16:35:41 -------------------------------------------------------------------------------- SuperBill Details Patient Name: Date of Service: [name], [facility] RO N 10/24/2022 Medical Record Number: [id number] Patient Account Number: [id number] Date of Birth/Sex: Treating RN: [birth year] (82 y.o. 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 atrial fibrillation Z79.01 Long term (current) use of anticoagulants I89.0 Lymphedema, not elsewhere classified Facility Procedures : CPT4 Code: [id number] Description: 11042 - DEB SUBQ  TISSUE 20 SQ CM/< ICD-10 Diagnosis Description L97.522 Non-pressure chronic ulcer of other part of left foot with fat layer exposed Modifier: Quantity: 1 : CPT4 Code: 84696295 Description: 97597 - DEBRIDE WOUND 1ST 20 SQ CM OR < ICD-10 Diagnosis Description 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  witho Modifier: ut evidence of necr Quantity: 1 osis : CPT4 Code: 28413244 Description: 97598 - DEBRIDE WOUND EA ADDL 20 SQ CM ICD-10 Diagnosis Description 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  witho Modifier: ut evidence of necr Quantity: 1 osis Physician Procedures : CPT4 Code Description Modifier [id number] 99214 - WC PHYS LEVEL 4 - EST PT 25 ICD-10 Diagnosis Description L97.522 Non-pressure chronic ulcer of other part of left foot with fat layer exposed L97.526 Non-pressure chronic ulcer of other part of left foot  with bone involvement without evidence of necro I89.0 Lymphedema, not elsewhere classified 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 [name], [name] ([id number]) [id number] Quantity: 1 sis f Page 16 of 16 : [id number] 11042 - WC PHYS SUBQ TISS 20 SQ CM ICD-10 Diagnosis Description L97.522 Non-pressure chronic ulcer of other part of left foot with fat layer exposed Quantity: 1 : [id number] 97597 - WC PHYS DEBR WO ANESTH 20 SQ CM ICD-10 Diagnosis Description 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  without evidence of necrosis Quantity: 1 : [id number] 97598 - WC PHYS DEBR WO ANESTH EA ADD 20 CM ICD-10 Diagnosis Description L97.522 Non-pressure chronic ulcer of other part of left foot with fat layer exposed  L97.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  without evidence of necrosis Quantity: 1 Electronic Signature(s) Signed: 10/24/2022 4:39:50 PM By: [name] MD FACS Entered By: [name] on 10/24/2022 16:39:49

## 2022-11-01 ENCOUNTER — Ambulatory Visit (INDEPENDENT_AMBULATORY_CARE_PROVIDER_SITE_OTHER)
Admission: RE | Admit: 2022-11-01 | Discharge: 2022-11-01 | Disposition: A | Discharge status: Home / Self Care | Payer: Medicare Other | Source: Ambulatory Visit | Attending: Vascular Surgery | Admitting: Vascular Surgery

## 2022-11-01 ENCOUNTER — Ambulatory Visit (HOSPITAL_COMMUNITY)
Admission: RE | Admit: 2022-11-01 | Discharge: 2022-11-01 | Disposition: A | Discharge status: Home / Self Care | Payer: Medicare Other | Source: Ambulatory Visit | Attending: Vascular Surgery | Admitting: Vascular Surgery

## 2022-11-01 ENCOUNTER — Other Ambulatory Visit (HOSPITAL_COMMUNITY): Payer: Self-pay | Admitting: General Surgery

## 2022-11-01 ENCOUNTER — Encounter (HOSPITAL_BASED_OUTPATIENT_CLINIC_OR_DEPARTMENT_OTHER): Payer: Medicare Other | Admitting: General Surgery

## 2022-11-01 DIAGNOSIS — L97522 Non-pressure chronic ulcer of other part of left foot with fat layer exposed: Secondary | ICD-10-CM | POA: Diagnosis not present

## 2022-11-01 DIAGNOSIS — R609 Edema, unspecified: Secondary | ICD-10-CM

## 2022-11-01 DIAGNOSIS — S91302A Unspecified open wound, left foot, initial encounter: Secondary | ICD-10-CM | POA: Insufficient documentation

## 2022-11-01 LAB — VAS US ABI WITH/WO TBI
Left ABI: 0.84
Right ABI: 0.82

## 2022-11-01 NOTE — Progress Notes (Signed)
KISSA, CAMPOY ([id number]) [id number] Page 1 of 14 Visit Report for 11/01/2022 Arrival Information Details Patient Name: Date of Service: [name] 11/01/2022 8:15 A M Medical Record Number: [id number] Patient Account Number: [id number] Date of Birth/Sex: Treating RN: 01-Feb-1941 (82 y.o. F) Primary Care Delisha Peaden: [name], [facility] Other Clinician: Referring Roddy Bellamy: Treating Toye Rouillard/Extender: [name], [facility] Weeks in Treatment: 20 Visit Information History Since Last Visit All ordered tests and consults were completed: No Patient Arrived: Wheel Chair Added or deleted any medications: Yes Arrival Time: 08:06 Any new allergies or adverse reactions: No Accompanied By: aide Had a fall or experienced change in No Transfer Assistance: None activities of daily living that may affect Patient Identification Verified: Yes risk of falls: Secondary Verification Process Completed: Yes Signs or symptoms of abuse/neglect since last visito No Patient Requires Transmission-Based Precautions: No Hospitalized since last visit: No Patient Has Alerts: No Implantable device outside of the clinic excluding No cellular tissue based products placed in the center since last visit: Has Dressing in Place as Prescribed: Yes Has Compression in Place as Prescribed: Yes Pain Present Now: No Electronic Signature(s) Signed: 11/01/2022 3:51:17 PM By: Zenaida Deed RN, BSN Entered By: Zenaida Deed on 11/01/2022 08:19:15 -------------------------------------------------------------------------------- Compression Therapy Details Patient Name: Date of Service: [name], [facility] RO N 11/01/2022 8:15 A M Medical Record Number: [id number] Patient Account Number: [id number] Date of Birth/Sex: Treating RN: Oct 27, 1940 (82 y.o. Tommye Standard Primary Care Morad Tal: [name], [facility] Other Clinician: Referring Raysa Bosak: Treating Emma-Lee Oddo/Extender: [name] SHO [name], [facility] Weeks in Treatment: 20 Compression Therapy Performed for Wound Assessment: Wound #6 Left,Dorsal Foot Performed By: Clinician Zenaida Deed, RN Compression Type: Double Layer Post Procedure Diagnosis Same as Pre-procedure Notes urgo lite Electronic Signature(s) Signed: 11/01/2022 3:51:17 PM By: Zenaida Deed RN, BSN Entered By: Zenaida Deed on 11/01/2022 08:48:37 Makarewicz, [name] ([id number]) [id number] Page 2 of 14 -------------------------------------------------------------------------------- Encounter Discharge Information Details Patient Name: Date of Service: [name] 11/01/2022 8:15 A M Medical Record Number: [id number] Patient Account Number: [id number] Date of Birth/Sex: Treating RN: June 28, 1940 (82 y.o. Tommye Standard Primary Care Devere Brem: [name], [facility] Other Clinician: Referring Jaimin Krupka: Treating Delvonte Berenson/Extender: [name] SHO [name], [facility] Weeks in Treatment: 20 Encounter Discharge Information Items Post Procedure Vitals Discharge Condition: Stable Temperature (F): 98.4 Ambulatory Status: Wheelchair Pulse (bpm): 97 Discharge Destination: Home Respiratory Rate (breaths/min): 18 Transportation: Other Blood Pressure (mmHg): 110/72 Accompanied By: caregiver Schedule Follow-up Appointment: Yes Clinical Summary of Care: Patient Declined Notes facility transportation Electronic Signature(s) Signed: 11/01/2022 3:51:17 PM By: Zenaida Deed RN, BSN Entered By: Zenaida Deed on 11/01/2022 09:10:29 -------------------------------------------------------------------------------- Lower Extremity Assessment Details Patient Name: Date of Service: [name] [facility] RO N 11/01/2022 8:15 A M Medical Record Number: [id number] Patient Account Number: [id number] Date of Birth/Sex: Treating RN: 1940-11-20 (82 y.o. Tommye Standard Primary Care Cy Bresee: [name], [facility] Other Clinician: Referring  Eather Chaires: Treating Imer Foxworth/Extender: [name] SHO [name], [facility] Weeks in Treatment: 20 Edema Assessment Assessed: [Left: No] [Right: No] Edema: [Left: Ye] [Right: s] Calf Left: Right: Point of Measurement: From Medial Instep 27 cm Ankle Left: Right: Point of Measurement: From Medial Instep 20 cm Vascular Assessment Pulses: Dorsalis Pedis Palpable: [Left:No] Extremity colors, hair growth, and conditions: Extremity Color: [Left:Hyperpigmented] Hair Growth on Extremity: [Left:No] Temperature of Extremity: [Left:Cool] Capillary Refill: [Left:> 3 seconds] Dependent Rubor: [Left:Yes] Blanched when Elevated: [Left:No] Lipodermatosclerosis: [Left:No] Traub, Princes ([id number]) [Right:[id number] Page 3 of 14] Electronic Signature(s) Signed: 11/01/2022 3:51:17 PM By: Daneil Dan,  Legrand Como, BSN Entered By: Zenaida Deed on 11/01/2022 40:98:11 -------------------------------------------------------------------------------- Multi Wound Chart Details Patient Name: Date of Service: [name] 11/01/2022 8:15 A M Medical Record Number: [id number] Patient Account Number: [id number] Date of Birth/Sex: Treating RN: 1940/12/22 (82 y.o. Tommye Standard Primary Care Wisdom Rickey: [name], [facility] Other Clinician: Referring Lorre Opdahl: Treating Walta Bellville/Extender: [name] SHO [name], [facility] Weeks in Treatment: 20 Vital Signs Height(in): 67 Pulse(bpm): 97 Weight(lbs): 153 Blood Pressure(mmHg): 110/72 Body Mass Index(BMI): 24 Temperature(F): 98.4 Respiratory Rate(breaths/min): 18 [1:Photos:] Left, Dorsal T Great oe Left, Dorsal Foot Left, Plantar T Fourth oe Wound Location: Footwear Injury Gradually Appeared Gradually Appeared Wounding Event: Abrasion Lymphedema Atypical Primary Etiology: Anemia, Angina, Hypertension, Anemia, Angina, Hypertension, Anemia, Angina, Hypertension, Comorbid History: Vasculitis, Osteoarthritis Vasculitis,  Osteoarthritis Vasculitis, Osteoarthritis 05/24/2021 09/05/2022 10/03/2022 Date Acquired: 20 8 4  Weeks of Treatment: Open Open Open Wound Status: No No No Wound Recurrence: 0x0x0 7x2.9x0.1 0.4x0.4x0.1 Measurements L x W x D (cm) 0 15.944 0.126 A (cm) : rea 0 1.594 0.013 Volume (cm) : 100.00% 59.00% -77.50% % Reduction in A rea: 100.00% 59.00% -85.70% % Reduction in Volume: 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 Full Thickness With Exposed Support Classification: Support Structures Support Structures Structures None Present Medium Medium Exudate A mount: N/A Purulent Serous Exudate Type: N/A yellow, brown, green amber Exudate Color: N/A Indistinct, nonvisible Flat and Intact Wound Margin: None Present (0%) Medium (34-66%) None Present (0%) Granulation A mount: N/A Pink N/A Granulation Quality: None Present (0%) Medium (34-66%) Large (67-100%) Necrotic A mount: N/A Adherent Slough Eschar, Adherent Slough Necrotic Tissue: Fascia: No Fat Layer (Subcutaneous Tissue): Yes Fat Layer (Subcutaneous Tissue): Yes Exposed Structures: Fat Layer (Subcutaneous Tissue): No Fascia: No Bone: Yes Tendon: No Tendon: No Fascia: No Muscle: No Muscle: No Tendon: No Joint: No Joint: No Muscle: No Bone: No Bone: No Joint: No Large (67-100%) Medium (34-66%) Small (1-33%) Epithelialization: N/A Debridement - Selective/Open Wound Debridement - Selective/Open Wound Debridement: Pre-procedure Verification/Time Out N/A 08:40 08:40 Taken: N/A Lidocaine 4% Topical Solution Lidocaine 4% Topical Solution Pain Control: N/A [facility] Tissue Debrided: N/A Non-Viable Tissue Non-Viable Tissue Level: N/A 9.56 0.13 Debridement A (sq cm): rea N/A Curette Curette Instrument: N/A Minimum Minimum Bleeding: Xu, Koula ([id number]) [id number] Page 4 of 14 N/A Pressure Pressure Hemostasis Achieved: N/A 5 5 Procedural Pain: N/A 4  4 Post Procedural Pain: N/A Procedure was tolerated well Procedure was tolerated well Debridement Treatment Response: N/A 7x2.9x0.1 0.4x0.4x0.1 Post Debridement Measurements L x W x D (cm) N/A 1.594 0.013 Post Debridement Volume: (cm) N/A N/A N/A Post Debridement Stage: Scarring: Yes Excoriation: Yes No Abnormalities Noted Periwound Skin Texture: Maceration: No Maceration: Yes No Abnormalities Noted Periwound Skin Moisture: Dry/Scaly: No Rubor: No Rubor: Yes Rubor: Yes Periwound Skin Color: No Abnormality Cool/Cold Cool/Cold Temperature: N/A Yes Yes Tenderness on Palpation: N/A Compression Therapy Debridement Procedures Performed: Debridement Wound Number: 8 9 N/A Photos: N/A Left, Lateral Foot Left, Lateral T Great oe N/A Wound Location: Pressure Injury Gradually Appeared N/A Wounding Event: Pressure Ulcer Atypical N/A Primary Etiology: Anemia, Angina, Hypertension, Anemia, Angina, Hypertension, N/A Comorbid History: Vasculitis, Osteoarthritis Vasculitis, Osteoarthritis 10/24/2022 11/01/2022 N/A Date Acquired: 1 0 N/A Weeks of Treatment: Open Open N/A Wound Status: No No N/A Wound Recurrence: 0.9x0.3x0.1 1.1x0.8x0.1 N/A Measurements L x W x D (cm) 0.212 0.691 N/A A (cm) : rea 0.021 0.069 N/A Volume (cm) : -8.20% N/A N/A % Reduction in A rea: 46.20% N/A N/A % Reduction in Volume: Unstageable/Unclassified Full  Thickness Without Exposed N/A Classification: Support Structures Medium Medium N/A Exudate A mount: Serosanguineous Serosanguineous N/A Exudate Type: red, brown red, brown N/A Exudate Color: N/A Flat and Intact N/A Wound Margin: N/A Medium (34-66%) N/A Granulation A mount: N/A Red N/A Granulation Quality: N/A Medium (34-66%) N/A Necrotic A mount: N/A Adherent Slough N/A Necrotic Tissue: N/A Fat Layer (Subcutaneous Tissue): Yes N/A Exposed Structures: Fascia: No Tendon: No Muscle: No Joint: No Bone: No N/A None  N/A Epithelialization: Debridement - Selective/Open Wound N/A N/A Debridement: Pre-procedure Verification/Time Out 08:40 N/A N/A Taken: Lidocaine 4% Topical Solution N/A N/A Pain Control: Slough N/A N/A Tissue Debrided: Non-Viable Tissue N/A N/A Level: 0.35 N/A N/A Debridement A (sq cm): rea Curette N/A N/A Instrument: Minimum N/A N/A Bleeding: Pressure N/A N/A Hemostasis A chieved: 5 N/A N/A Procedural Pain: 4 N/A N/A Post Procedural Pain: Procedure was tolerated well N/A N/A Debridement Treatment Response: 0.9x0.5x0.1 N/A N/A Post Debridement Measurements L x W x D (cm) 0.035 N/A N/A Post Debridement Volume: (cm) Unstageable/Unclassified N/A N/A Post Debridement Stage: No Abnormalities Noted N/A Periwound Skin Texture: No Abnormalities Noted N/A Periwound Skin Moisture: Rubor: Yes N/A Periwound Skin Color: N/A Cool/Cold N/A Temperature: N/A Yes N/A Tenderness on Palpation: Debridement N/A N/A Procedures Performed: Treatment Notes Housh, Rakel ([id number]) [id number] Page 5 of 14 Electronic Signature(s) Signed: 11/01/2022 8:54:10 AM By: [name] MD FACS Signed: 11/01/2022 3:51:17 PM By: Zenaida Deed RN, BSN Entered By: [name] on 11/01/2022 08:54:10 -------------------------------------------------------------------------------- Multi-Disciplinary Care Plan Details Patient Name: Date of Service: [name], [facility] RO N 11/01/2022 8:15 A M Medical Record Number: [id number] Patient Account Number: [id number] Date of Birth/Sex: Treating RN: [birth year] (82 y.o. Tommye Standard Primary Care Dakota Stangl: [name], [facility] Other Clinician: Referring Jayda White: Treating Orman Matsumura/Extender: [name], [facility] Weeks in Treatment: 20 Multidisciplinary Care Plan reviewed with physician Active Inactive Necrotic Tissue Nursing Diagnoses: Impaired tissue integrity related to necrotic/devitalized tissue Knowledge  deficit related to management of necrotic/devitalized tissue Goals: Necrotic/devitalized tissue will be minimized in the wound bed Date Initiated: 06/12/2022 Target Resolution Date: 12/21/2022 Goal Status: Active Patient/caregiver will verbalize understanding of reason and process for debridement of necrotic tissue Date Initiated: 06/12/2022 Target Resolution Date: 12/21/2022 Goal Status: Active Interventions: Assess patient pain level pre-, during and post procedure and prior to discharge Provide education on necrotic tissue and debridement process Treatment Activities: Apply topical anesthetic as ordered : 06/12/2022 Notes: Wound/Skin Impairment Nursing Diagnoses: Impaired tissue integrity Knowledge deficit related to ulceration/compromised skin integrity Goals: Patient/caregiver will verbalize understanding of skin care regimen Date Initiated: 06/12/2022 Target Resolution Date: 12/21/2022 Goal Status: Active Interventions: Assess ulceration(s) every visit Treatment Activities: Skin care regimen initiated : 06/12/2022 Topical wound management initiated : 06/12/2022 Notes: Electronic Signature(s) Signed: 11/01/2022 3:51:17 PM By: Zenaida Deed RN, BSN Entered By: Zenaida Deed on 11/01/2022 08:08:59 Marsh Dolly ([id number]) [id number] Page 6 of 14 -------------------------------------------------------------------------------- Pain Assessment Details Patient Name: Date of Service: [name] 11/01/2022 8:15 A M Medical Record Number: [id number] Patient Account Number: [id number] Date of Birth/Sex: Treating RN: [birth year] (82 y.o. Tommye Standard Primary Care Darrious Youman: [name], [facility] Other Clinician: Referring Shaunta Oncale: Treating Sherrelle Prochazka/Extender: [name], [facility] Weeks in Treatment: 20 Active Problems Location of Pain Severity and Description of Pain Patient Has Paino No Site Locations Rate the pain. Current Pain  Level: 0 Character of Pain Describe the Pain: Tender Pain Management and Medication Current Pain Management: Electronic Signature(s) Signed: 11/01/2022 3:51:17 PM By: Zenaida Deed RN, BSN  Entered By: Zenaida Deed on 11/01/2022 08:26:16 -------------------------------------------------------------------------------- Patient/Caregiver Education Details Patient Name: Date of Service: [place] Herald, [facility] RO [place] 7/11/2024andnbsp8:15 A M Medical Record Number: [id number] Patient Account Number: [id number] Date of Birth/Gender: Treating RN: 27-Feb-1941 (82 y.o. Tommye Standard Primary Care Physician: [name], [facility] Other Clinician: Referring Physician: Treating Physician/Extender: [name], [facility] Weeks in Treatment: 20 Education Assessment Education Provided To: Patient Education Topics Provided [place], [name] ([id number]) [id number] Page 7 of 14 Wound/Skin Impairment: Methods: Explain/Verbal Responses: Reinforcements needed, State content correctly Electronic Signature(s) Signed: 11/01/2022 3:51:17 PM By: Zenaida Deed RN, BSN Entered By: Zenaida Deed on 11/01/2022 08:10:54 -------------------------------------------------------------------------------- Wound Assessment Details Patient Name: Date of Service: [name] [facility] RO N 11/01/2022 8:15 A M Medical Record Number: [id number] Patient Account Number: [id number] Date of Birth/Sex: Treating RN: 07-23-1940 (82 y.o. Tommye Standard Primary Care Annagrace Carr: [name], [facility] Other Clinician: Referring Rhyen Mazariego: Treating Mckaylie Vasey/Extender: [name] SHO KES, [facility] Weeks in Treatment: 20 Wound Status Wound Number: 1 Primary Etiology: Abrasion Wound Location: Left, Dorsal T Great oe Wound Status: Open Wounding Event: Footwear Injury Comorbid History: Anemia, Angina, Hypertension, Vasculitis, Osteoarthritis Date Acquired: 05/24/2021 Weeks Of Treatment: 20 Clustered Wound:  No Photos Wound Measurements Length: (cm) Width: (cm) Depth: (cm) Area: (cm) Volume: (cm) 0 % Reduction in Area: 100% 0 % Reduction in Volume: 100% 0 Epithelialization: Large (67-100%) 0 Tunneling: No 0 Undermining: No Wound Description Classification: 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 Support Structures Exudate Amount: None Present Foul Odor After Cleansing: No Slough/Fibrino No Wound Bed Granulation Amount: None Present (0%) Exposed Structure Necrotic Amount: None Present (0%) Fascia Exposed: No Fat Layer (Subcutaneous Tissue) Exposed: No Tendon Exposed: No Muscle Exposed: No Joint Exposed: No Bone Exposed: No Periwound Skin Texture Texture Color No Abnormalities Noted: Yes No Abnormalities Noted: Yes Moisture Temperature / Pain Dupuy, Eleonore ([id number]) [id number] Page 8 of 14 Moisture Temperature / Pain No Abnormalities Noted: No Temperature: No Abnormality Dry / Scaly: No Maceration: No Electronic Signature(s) Signed: 11/01/2022 3:51:17 PM By: Zenaida Deed RN, BSN Entered By: Zenaida Deed on 11/01/2022 08:31:53 -------------------------------------------------------------------------------- Wound Assessment Details Patient Name: Date of Service: [name] [facility] RO N 11/01/2022 8:15 A M Medical Record Number: [id number] Patient Account Number: [id number] Date of Birth/Sex: Treating RN: 1940/04/27 (82 y.o. Tommye Standard Primary Care Cristo Ausburn: [name], [facility] Other Clinician: Referring Pecola Haxton: Treating Kadia Abaya/Extender: [name] SHO [name], [facility] Weeks in Treatment: 20 Wound Status Wound Number: 6 Primary Etiology: Lymphedema Wound Location: Left, Dorsal Foot Wound Status: Open Wounding Event: Gradually Appeared Comorbid History: Anemia, Angina, Hypertension, Vasculitis, Osteoarthritis Date Acquired: 09/05/2022 Weeks Of Treatment: 8 Clustered Wound: No Photos Wound Measurements Length: (cm) 7 Width: (cm)  2.9 Depth: (cm) 0.1 Area: (cm) 15.944 Volume: (cm) 1.594 % Reduction in Area: 59% % Reduction in Volume: 59% Epithelialization: Medium (34-66%) Tunneling: No Undermining: No Wound Description Classification: 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 Support Wound Margin: Indistinct, nonvisible Exudate Amount: Medium Exudate Type: Purulent Exudate Color: yellow, brown, green Structures Foul Odor After Cleansing: No Slough/Fibrino Yes Wound Bed Granulation Amount: Medium (34-66%) Exposed Structure Granulation Quality: Pink Fascia Exposed: No Necrotic Amount: Medium (34-66%) Fat Layer (Subcutaneous Tissue) Exposed: Yes Necrotic Quality: Adherent Slough Tendon Exposed: No Muscle Exposed: No Joint Exposed: No Bone Exposed: No Periwound Skin Texture Texture Color Canale, Gypsy ([id number]) [id number] Page 9 of 14 No Abnormalities Noted: Yes No Abnormalities Noted: No Rubor: Yes Moisture No Abnormalities Noted: Yes Temperature / Pain Temperature: Cool/Cold Tenderness on Palpation: Yes Treatment Notes Wound #6 (  Foot) Wound Laterality: Dorsal, Left Cleanser Soap and Water Discharge Instruction: May shower and wash wound with dial antibacterial soap and water prior to dressing change. Wound Cleanser Discharge Instruction: Cleanse the wound with wound cleanser prior to applying a clean dressing using gauze sponges, not tissue or cotton balls. Peri-Wound Care Sween Lotion (Moisturizing lotion) Discharge Instruction: Apply moisturizing lotion as directed Topical Gentamicin Discharge Instruction: As directed by physician Mupirocin Ointment Discharge Instruction: Apply Mupirocin (Bactroban) as instructed Ketoconazole Cream 2% Discharge Instruction: Apply Ketoconazole as directed Triamcinolone Discharge Instruction: Apply Triamcinolone as directed zinc Primary Dressing Maxorb Extra Ag+ Alginate Dressing, 4x4.75 (in/in) Discharge Instruction: Apply to  wound bed as instructed Secondary Dressing ABD Pad, 8x10 Discharge Instruction: Apply over primary dressing as directed. Drawtex 4x4 in Discharge Instruction: Apply over primary dressing as directed. Woven Gauze Sponge, Non-Sterile 4x4 in Discharge Instruction: can use gauze between toes (if no desired) Zetuvit Plus 4x8 in Discharge Instruction: Apply over primary dressing as directed. Secured With Compression Wrap Urgo K2 Lite, (equivalent to a 3 layer) two layer compression system, regular Discharge Instruction: Apply Urgo K2 Lite as directed (alternative to 3 layer compression). Compression Stockings Add-Ons Electronic Signature(s) Signed: 11/01/2022 3:51:17 PM By: Zenaida Deed RN, BSN Entered By: Zenaida Deed on 11/01/2022 08:33:19 -------------------------------------------------------------------------------- Wound Assessment Details Patient Name: Date of Service: [name] [facility] RO N 11/01/2022 8:15 A M Medical Record Number: [id number] Patient Account Number: [id number] Date of Birth/Sex: Treating RN: 1940-08-06 (82 y.o. Tommye Standard [place], [place] ([id number]) [id number] Page 10 of 14 Primary Care Keali Mccraw: [name], [facility] Other Clinician: Referring Nusaiba Guallpa: Treating Devanee Pomplun/Extender: [name] SHO [name], [facility] Weeks in Treatment: 20 Wound Status Wound Number: 7 Primary Etiology: Atypical Wound Location: Left, Plantar T Fourth oe Wound Status: Open Wounding Event: Gradually Appeared Comorbid History: Anemia, Angina, Hypertension, Vasculitis, Osteoarthritis Date Acquired: 10/03/2022 Weeks Of Treatment: 4 Clustered Wound: No Photos Wound Measurements Length: (cm) 0.4 Width: (cm) 0.4 Depth: (cm) 0.1 Area: (cm) 0.126 Volume: (cm) 0.013 % Reduction in Area: -77.5% % Reduction in Volume: -85.7% Epithelialization: Small (1-33%) Tunneling: No Undermining: No Wound Description Classification: Full Thickness With Exposed  Support Structures Wound Margin: Flat and Intact Exudate Amount: Medium Exudate Type: Serous Exudate Color: amber Foul Odor After Cleansing: No Slough/Fibrino Yes Wound Bed Granulation Amount: None Present (0%) Exposed Structure Necrotic Amount: Large (67-100%) Fascia Exposed: No Necrotic Quality: Eschar, Adherent Slough Fat Layer (Subcutaneous Tissue) Exposed: Yes Tendon Exposed: No Muscle Exposed: No Joint Exposed: No Bone Exposed: Yes Periwound Skin Texture Texture Color No Abnormalities Noted: Yes No Abnormalities Noted: No Rubor: Yes Moisture No Abnormalities Noted: Yes Temperature / Pain Temperature: Cool/Cold Tenderness on Palpation: Yes Treatment Notes Wound #7 (Toe Fourth) Wound Laterality: Plantar, Left Cleanser Soap and Water Discharge Instruction: May shower and wash wound with dial antibacterial soap and water prior to dressing change. Wound Cleanser Discharge Instruction: Cleanse the wound with wound cleanser prior to applying a clean dressing using gauze sponges, not tissue or cotton balls. Peri-Wound Care Sween Lotion (Moisturizing lotion) Discharge Instruction: Apply moisturizing lotion as directed Topical Calvi, Misk ([id number]) [id number] Page 11 of 14 Gentamicin Discharge Instruction: As directed by physician Mupirocin Ointment Discharge Instruction: Apply Mupirocin (Bactroban) as instructed Ketoconazole Cream 2% Discharge Instruction: Apply Ketoconazole as directed Triamcinolone Discharge Instruction: Apply Triamcinolone as directed zinc Primary Dressing Endoform 2x2 in Discharge Instruction: Moisten with saline Secondary Dressing ABD Pad, 8x10 Discharge Instruction: Apply over primary dressing as directed. Drawtex 4x4 in Discharge Instruction: Apply over primary dressing as  directed. Woven Gauze Sponge, Non-Sterile 4x4 in Discharge Instruction: can use gauze between toes (if no desired) Zetuvit Plus 4x8  in Discharge Instruction: Apply over primary dressing as directed. Secured With Compression Wrap Urgo K2 Lite, (equivalent to a 3 layer) two layer compression system, regular Discharge Instruction: Apply Urgo K2 Lite as directed (alternative to 3 layer compression). Compression Stockings Add-Ons Electronic Signature(s) Signed: 11/01/2022 3:51:17 PM By: Zenaida Deed RN, BSN Entered By: Zenaida Deed on 11/01/2022 08:35:56 -------------------------------------------------------------------------------- Wound Assessment Details Patient Name: Date of Service: [name] [facility] RO N 11/01/2022 8:15 A M Medical Record Number: [id number] Patient Account Number: [id number] Date of Birth/Sex: Treating RN: 21-Feb-1941 (82 y.o. Tommye Standard Primary Care Yasmine Kilbourne: [name], [facility] Other Clinician: Referring Riya Huxford: Treating Miosotis Wetsel/Extender: [name] SHO [name], [facility] Weeks in Treatment: 20 Wound Status Wound Number: 8 Primary Etiology: Pressure Ulcer Wound Location: Left, Lateral Foot Wound Status: Open Wounding Event: Pressure Injury Comorbid History: Anemia, Angina, Hypertension, Vasculitis, Osteoarthritis Date Acquired: 10/24/2022 Weeks Of Treatment: 1 Clustered Wound: No Photos [place], Eun ([id number]) [id number] Page 12 of 14 Wound Measurements Length: (cm) 0.9 Width: (cm) 0.3 Depth: (cm) 0.1 Area: (cm) 0.212 Volume: (cm) 0.021 % Reduction in Area: -8.2% % Reduction in Volume: 46.2% Wound Description Classification: Unstageable/Unclassified Exudate Amount: Medium Exudate Type: Serosanguineous Exudate Color: red, brown Periwound Skin Texture Texture Color No Abnormalities Noted: No No Abnormalities Noted: No Moisture No Abnormalities Noted: No Treatment Notes Wound #8 (Foot) Wound Laterality: Left, Lateral Cleanser Soap and Water Discharge Instruction: May shower and wash wound with dial antibacterial soap and water prior to  dressing change. Wound Cleanser Discharge Instruction: Cleanse the wound with wound cleanser prior to applying a clean dressing using gauze sponges, not tissue or cotton balls. Peri-Wound Care Sween Lotion (Moisturizing lotion) Discharge Instruction: Apply moisturizing lotion as directed Topical Gentamicin Discharge Instruction: As directed by physician Mupirocin Ointment Discharge Instruction: Apply Mupirocin (Bactroban) as instructed Ketoconazole Cream 2% Discharge Instruction: Apply Ketoconazole as directed Triamcinolone Discharge Instruction: Apply Triamcinolone as directed zinc Primary Dressing Endoform 2x2 in Discharge Instruction: Moisten with saline Secondary Dressing ABD Pad, 8x10 Discharge Instruction: Apply over primary dressing as directed. Drawtex 4x4 in Discharge Instruction: Apply over primary dressing as directed. Woven Gauze Sponge, Non-Sterile 4x4 in Discharge Instruction: can use gauze between toes (if no desired) Zetuvit Plus 4x8 in Discharge Instruction: Apply over primary dressing as directed. TAMAKA, SAWIN ([id number]) [id number] Page 13 of 14 Secured With Compression Wrap Urgo K2 Lite, (equivalent to a 3 layer) two layer compression system, regular Discharge Instruction: Apply Urgo K2 Lite as directed (alternative to 3 layer compression). Compression Stockings Add-Ons Electronic Signature(s) Signed: 11/01/2022 11:36:21 AM By: [name] Signed: 11/01/2022 3:51:17 PM By: Zenaida Deed RN, BSN Entered By: [name] on 11/01/2022 08:30:02 -------------------------------------------------------------------------------- Wound Assessment Details Patient Name: Date of Service: [name], [facility] RO N 11/01/2022 8:15 A M Medical Record Number: [id number] Patient Account Number: [id number] Date of Birth/Sex: Treating RN: 12/20/1940 (82 y.o. Tommye Standard Primary Care Marciano Mundt: [name], [facility] Other Clinician: Referring  Jaiyden Laur: Treating Ladean Steinmeyer/Extender: [name] SHO [name], [facility] Weeks in Treatment: 20 Wound Status Wound Number: 9 Primary Etiology: Atypical Wound Location: Left, Lateral T [place] oe Wound Status: Open Wounding Event: Gradually Appeared Comorbid History: Anemia, Angina, Hypertension, Vasculitis, Osteoarthritis Date Acquired: 11/01/2022 Weeks Of Treatment: 0 Clustered Wound: No Photos Wound Measurements Length: (cm) 1.1 Width: (cm) 0.8 Depth: (cm) 0.1 Area: (cm) 0.691 Volume: (cm) 0.069 % Reduction in Area: % Reduction in Volume: Epithelialization:  None Tunneling: No Undermining: No Wound Description Classification: 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 Support Structures Wound Margin: Flat and Intact Exudate Amount: Medium Exudate Type: Serosanguineous Exudate Color: red, brown Foul Odor After Cleansing: No Slough/Fibrino Yes Wound Bed Granulation Amount: Medium (34-66%) Exposed Structure Granulation Quality: Red Fascia Exposed: No Necrotic Amount: Medium (34-66%) Fat Layer (Subcutaneous Tissue) Exposed: Yes Necrotic Quality: Adherent Slough Tendon Exposed: No Muscle Exposed: No Joint Exposed: No Grindle, Katriana ([id number]) [id number] Page 14 of 14 Bone Exposed: No Periwound Skin Texture Texture Color No Abnormalities Noted: Yes No Abnormalities Noted: No Rubor: Yes Moisture No Abnormalities Noted: Yes Temperature / Pain Temperature: Cool/Cold Tenderness on Palpation: Yes Electronic Signature(s) Signed: 11/01/2022 11:36:21 AM By: [name] Signed: 11/01/2022 3:51:17 PM By: Zenaida Deed RN, BSN Entered By: [name] on 11/01/2022 08:32:03 -------------------------------------------------------------------------------- Vitals Details Patient Name: Date of Service: [place] [name], [facility] RO N 11/01/2022 8:15 A M Medical Record Number: [id number] Patient Account Number: [id number] Date of Birth/Sex: Treating RN: 1940/10/02 (82 y.o.  F) Primary Care Tavionna Grout: [name], [facility] Other Clinician: Referring Jatavian Calica: Treating Euel Castile/Extender: [name], [facility] Weeks in Treatment: 20 Vital Signs Time Taken: 08:06 Temperature (F): 98.4 Height (in): 67 Pulse (bpm): 97 Weight (lbs): 153 Respiratory Rate (breaths/min): 18 Body Mass Index (BMI): 24 Blood Pressure (mmHg): 110/72 Reference Range: 80 - 120 mg / dl Electronic Signature(s) Signed: 11/01/2022 3:51:17 PM By: Zenaida Deed RN, BSN Entered By: Zenaida Deed on 11/01/2022 08:19:26

## 2022-11-01 NOTE — Progress Notes (Signed)
MAUDELL, [name] ([id number]) [id number] Page 1 of 14 Visit Report for 11/01/2022 Chief Complaint Document Details Patient Name: Date of Service: [name] 11/01/2022 8:15 A M Medical Record Number: [id number] Patient Account Number: [id number] Date of Birth/Sex: Treating RN: 1940/05/29 (82 y.o. [name] Primary Care Provider: [name], [facility] Other Clinician: Referring Provider: Treating Provider/Extender: [name], [facility] Weeks in Treatment: 20 Information Obtained from: Patient Chief Complaint Patient seen for complaints of Non-Healing Wound. Electronic Signature(s) Signed: 11/01/2022 8:54:24 AM By: [name] MD FACS Entered By: [name] on 11/01/2022 08:54:24 -------------------------------------------------------------------------------- Debridement Details Patient Name: Date of Service: [name], [facility] [name] 11/01/2022 8:15 A M Medical Record Number: [id number] Patient Account Number: [id number] Date of Birth/Sex: Treating RN: 05/27/1940 (82 y.o. [name] Primary Care Provider: [name], [facility] Other Clinician: Referring Provider: Treating Provider/Extender: [name], [facility] Weeks in Treatment: 20 Debridement Performed for Assessment: Wound #6 Left,Dorsal Foot Performed By: Physician [name], MD Debridement Type: Debridement Level of Consciousness (Pre-procedure): Awake and Alert Pre-procedure Verification/Time Out Yes - 08:40 Taken: Start Time: 08:41 Pain Control: Lidocaine 4% T opical Solution Percent of Wound Bed Debrided: 60% T Area Debrided (cm): otal 9.56 Tissue and other material debrided: Non-Viable, Slough, Slough Level: Non-Viable Tissue Debridement Description: Selective/Open Wound Instrument: Curette Bleeding: Minimum Hemostasis Achieved: Pressure Procedural Pain: 5 Post Procedural Pain: 4 Response to Treatment: Procedure was tolerated well Level of  Consciousness (Post- Awake and Alert procedure): Post Debridement Measurements of Total Wound Length: (cm) 7 Width: (cm) 2.9 Depth: (cm) 0.1 Volume: (cm) 1.594 Character of Wound/Ulcer Post Debridement: Improved Post Procedure Diagnosis [name] ([id number]) [id number] Page 2 of 14 Same as Pre-procedure Notes scribed for Dr. Lady Gary by Zenaida Deed, RN Electronic Signature(s) Signed: 11/01/2022 12:08:46 PM By: [name] MD FACS Signed: 11/01/2022 3:51:17 PM By: Zenaida Deed RN, BSN Entered By: Zenaida Deed on 11/01/2022 08:45:42 -------------------------------------------------------------------------------- Debridement Details Patient Name: Date of Service: [name], [facility] [name] 11/01/2022 8:15 A M Medical Record Number: [id number] Patient Account Number: [id number] Date of Birth/Sex: Treating RN: [birth year] (82 y.o. [name] Primary Care Provider: [name], [facility] Other Clinician: Referring Provider: Treating Provider/Extender: [name], [facility] Weeks in Treatment: 20 Debridement Performed for Assessment: Wound #8 Left,Lateral Foot Performed By: Physician [name], MD Debridement Type: Debridement Level of Consciousness (Pre-procedure): Awake and Alert Pre-procedure Verification/Time Out Yes - 08:40 Taken: Start Time: 08:41 Pain Control: Lidocaine 4% T opical Solution Percent of Wound Bed Debrided: 100% T Area Debrided (cm): otal 0.35 Tissue and other material debrided: Non-Viable, Slough, Slough Level: Non-Viable Tissue Debridement Description: Selective/Open Wound Instrument: Curette Bleeding: Minimum Hemostasis Achieved: Pressure Procedural Pain: 5 Post Procedural Pain: 4 Response to Treatment: Procedure was tolerated well Level of Consciousness (Post- Awake and Alert procedure): Post Debridement Measurements of Total Wound Length: (cm) 0.9 Stage: Unstageable/Unclassified Width: (cm)  0.5 Depth: (cm) 0.1 Volume: (cm) 0.035 Character of Wound/Ulcer Post Debridement: Improved Post Procedure Diagnosis Same as Pre-procedure Notes scribed for Dr. Lady Gary by Zenaida Deed, RN Electronic Signature(s) Signed: 11/01/2022 12:08:46 PM By: [name] MD FACS Signed: 11/01/2022 3:51:17 PM By: Zenaida Deed RN, BSN Entered By: Zenaida Deed on 11/01/2022 08:47:35 [name], [name] ([id number]) [id number] Page 3 of 14 -------------------------------------------------------------------------------- Debridement Details Patient Name: Date of Service: [name] 11/01/2022 8:15 A M Medical Record Number: [id number] Patient Account Number: [id number] Date of Birth/Sex: Treating RN: [birth year] (82 y.o. [name] Primary Care Provider:  SHO KES, [facility] Other Clinician: Referring Provider: Treating Provider/Extender: [name], [employer] Weeks in Treatment: 20 Debridement Performed for Assessment: Wound #7 Left,Plantar T Fourth oe Performed By: Physician [name], MD Debridement Type: Debridement Level of Consciousness (Pre-procedure): Awake and Alert Pre-procedure Verification/Time Out Yes - 08:40 Taken: Start Time: 08:41 Pain Control: Lidocaine 4% T opical Solution Percent of Wound Bed Debrided: 100% T Area Debrided (cm): otal 0.13 Tissue and other material debrided: Non-Viable, Slough, Slough Level: Non-Viable Tissue Debridement Description: Selective/Open Wound Instrument: Curette Bleeding: Minimum Hemostasis Achieved: Pressure Procedural Pain: 5 Post Procedural Pain: 4 Response to Treatment: Procedure was tolerated well Level of Consciousness (Post- Awake and Alert procedure): Post Debridement Measurements of Total Wound Length: (cm) 0.4 Width: (cm) 0.4 Depth: (cm) 0.1 Volume: (cm) 0.013 Character of Wound/Ulcer Post Debridement: Improved Post Procedure Diagnosis Same as  Pre-procedure Notes scribed for Dr. Lady Gary by Zenaida Deed, RN Electronic Signature(s) Signed: 11/01/2022 12:08:46 PM By: [name] MD FACS Signed: 11/01/2022 3:51:17 PM By: Zenaida Deed RN, BSN Entered By: Zenaida Deed on 11/01/2022 08:48:05 -------------------------------------------------------------------------------- HPI Details Patient Name: Date of Service: [name], [facility] [name] 11/01/2022 8:15 A M Medical Record Number: [id number] Patient Account Number: [id number] Date of Birth/Sex: Treating RN: June 11, 1940 (82 y.o. [name] Primary Care Provider: [name], [facility] Other Clinician: Referring Provider: Treating Provider/Extender: [name], [facility] Weeks in Treatment: 20 History of Present Illness HPI Description: ADMISSION 06/12/2022 This is an 82 year old woman with a history of CVA, crest syndrome, atrial fibrillation, rocker-bottom foot deformity. 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 She has been followed by podiatry for this and I am not entirely clear as to how she came to be referred here. She resides in an assisted living facility. It is not clear what they have been putting on her wound, but on intake, she was noted to have denuded skin on her medial third toe, as well as ulcers on her dorsal great toe and lateral great toe. There is slough accumulation on both of the toe ulcers. There was an odor noted at intake, but after her foot was washed, the odor dissipated. Her toes are folded on top of each other creating areas of abrasion and pockets for moisture collection, which seems to be the primary cause of her ulceration. LORANDA, MASTEL ([id number]) [id number] Page 4 of 14 06/20/2022: The skin between her toes and on the ball of her foot is completely macerated. There has been more tissue breakdown. The wound on her great toe has some slough  accumulation. 06/27/2022: No change to her wounds today. There has been no further deterioration, but no significant improvement. She was both hypotensive and bradycardic on intake. 07/11/2022: Today, her foot is completely macerated. She reports that the wound care nurse actually soaked her foot and then applied foam, despite our specific orders to not use foam at all. She also is draining serous fluid from both legs and has 2+ pitting edema. She is on furosemide 20 mg twice a day. 07/19/2022: Once again, her foot is completely macerated. There has been further tissue breakdown to the second and third toes and she now has ulcers on the distal ends. The wounds on her medial and lateral great toe have thick slough accumulation. Apparently Xeroform was found between her toes on intake. Edema control is improved, but still not perfect. No overt drainage from her legs appreciated on exam today. 07/27/2022: She has less tissue  maceration today. Edema control in her bilateral lower extremities is improved. Still with slough accumulation on all open wound surfaces. 08/08/2022: Significant improvement this week. She has very little tissue maceration and according to her aide, there has been very little drainage from her legs. There is some slough accumulation on the medial great toe ulcer. Edema control is improved bilaterally. She is spending more time in her bed with her legs elevated and less in her wheelchair with her legs in a dependent position. 08/20/2022: The edema in her legs is now well-controlled with the use of the zinc Unna boots. Unfortunately, this seems to have resulted in more drainage coming from the open areas of her feet. They are a bit macerated but there has not been as much tissue breakdown secondary to moisture as we have seen on previous visits. 08/28/2022: The only remaining open wound in her foot is on the dorsal great toe. The improvement in the rest of the foot is quite dramatic, with no  tissue maceration or breakdown. She has been elevating her legs and wearing compression wraps. Edema control is excellent and there has been no drainage from her legs. 09/05/2022: Unfortunately, the distal half of her dorsal foot has broken down and has a layer of slough on the surface. This appears to be secondary to moisture accumulation. Her dressing was completely saturated. 09/20/2022: There has been further deterioration of her foot. The dressing that was applied was bizarre and involved Coflex foam wrapped around her foot to the ankle and then Kerlix and Coban over that to the knee. She fortunately did have silver alginate between her toes but the tissue breakdown from moisture is extensive. 09/25/2022: There has been massive improvement in her foot since last week. The maceration has decreased, edema control is markedly better, and the wounds are showing evidence of healing, as opposed to worsening. 10/03/2022: The wound on her great toe is much smaller with minimal slough accumulation. The dorsal foot is also improving. She has a new ulcer on the plantar surface of her left fourth toe, however, and bone is exposed. Edema control and tissue maceration continues to be significantly better now that we are doing all of the dressing care. 10/09/2022: Unfortunately, it seems that the patient has resumed her habit of sitting in her wheelchair with her legs in a dependent position and there has been a lot more drainage and maceration on her foot. The dorsal foot wounds have expanded and are deeper. She has a new wound on her second toe and although the initial wound on her great toe has healed, she has a new wound on the lateral aspect of her great toe, immediately adjacent to that on her second toe, suggesting these have been caused by friction of the 2 toes rubbing against each other. Her son is participating in this visit via speaker phone. 10/24/2022: Last week she had a nurse visit due to clinic  capacity and it appears that her dressing was done differently by the nurse that saw her then how it is usually performed by her regular nurse. Unfortunately, this meant there was more moisture-related tissue breakdown. The dorsum of her foot is open again and she has a new wound on the lateral aspect of her fifth toe. There is slough accumulation in each of the sites. 11/01/2022: There has been remarkable turnaround since her last visit. All of the wounds are smaller. The wound on the dorsal aspect of her great toe has closed completely. The wounds on  the top of her foot have contracted and are starting to epithelialize. The new wound on the lateral aspect of her fifth toe does have some tendon exposure today that was not appreciated at her last visit. There is more tissue over the exposed bone on the plantar surface of her fourth toe, although bone does still remain exposed. Electronic Signature(s) Signed: 11/01/2022 8:55:45 AM By: [name] MD FACS Entered By: [name] on 11/01/2022 08:55:45 -------------------------------------------------------------------------------- Physical Exam Details Patient Name: Date of Service: [name] [facility] [name] 11/01/2022 8:15 A M Medical Record Number: [id number] Patient Account Number: [id number] Date of Birth/Sex: Treating RN: [birth year] (82 y.o. [name] Primary Care Provider: [name], [facility] Other Clinician: Referring Provider: Treating Provider/Extender: [name] SHO KES, [facility] Weeks in Treatment: 20 Constitutional . . . . no acute distress. Respiratory Normal work of breathing on room air. [name], [name] ([id number]) [id number] Page 5 of 14 Notes 11/01/2022: There has been remarkable turnaround since her last visit. All of the wounds are smaller. The wound on the dorsal aspect of her great toe has closed completely. The wounds on the top of her foot have contracted and are starting to  epithelialize. The new wound on the lateral aspect of her fifth toe does have some tendon exposure today that was not appreciated at her last visit. There is more tissue over the exposed bone on the plantar surface of her fourth toe, although bone does still remain exposed. Electronic Signature(s) Signed: 11/01/2022 8:56:41 AM By: [name] MD FACS Entered By: [name] on 11/01/2022 08:56:41 -------------------------------------------------------------------------------- Physician Orders Details Patient Name: Date of Service: [name], [facility] [name] 11/01/2022 8:15 A M Medical Record Number: [id number] Patient Account Number: [id number] Date of Birth/Sex: Treating RN: 1940-05-02 (82 y.o. 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 - Dr. Lady Gary - room 2 DO NOT CHANGE DRESSING - Return A Anesthetic (In clinic) Topical Lidocaine 4% applied to wound bed - USED in Clinic Bathing/ Shower/ Hygiene May shower with protection but do not get wound dressing(s) wet. Protect dressing(s) with water repellant cover (for example, large plastic bag) or a cast cover and may then take shower. Edema Control - Lymphedema / SCD / Other Elevate legs to the level of the heart or above  for 30 minutes daily and/or when sitting for 3-4 times a day throughout the day. Avoid standing for long periods of time. Non Wound Condition Right Lower Extremity Other Non Wound Condition Orders/Instructions: - urgo lite or 3 layer compression wrap on right and left lower leg Wound Treatment Wound #6 - Foot Wound Laterality: Dorsal, Left Cleanser: Soap and Water 1 x Per Week/30 Days Discharge Instructions: May shower and wash wound with dial antibacterial soap and water prior to dressing change. Cleanser: Wound Cleanser 1 x Per Week/30 Days Discharge Instructions: Cleanse the wound with wound cleanser prior to applying a clean dressing using gauze sponges, not  tissue or cotton balls. Peri-Wound Care: Sween Lotion (Moisturizing lotion) 1 x Per Week/30 Days Discharge Instructions: Apply moisturizing lotion as directed Topical: Gentamicin 1 x Per Week/30 Days Discharge Instructions: As directed by physician Topical: Mupirocin Ointment 1 x Per Week/30 Days Discharge Instructions: Apply Mupirocin (Bactroban) as instructed Ditto, Bernette ([id number]) [id number] Page 6 of 14 Topical: Ketoconazole Cream 2% 1 x Per Week/30 Days Discharge Instructions: Apply Ketoconazole as directed Topical: Triamcinolone 1 x Per Week/30 Days Discharge Instructions: Apply Triamcinolone as directed Topical: zinc 1 x Per Week/30 Days Prim Dressing: Maxorb Extra Ag+ Alginate Dressing, 4x4.75 (in/in) 1 x Per Week/30 Days ary Discharge Instructions: Apply to wound bed as instructed Secondary Dressing: ABD Pad, 8x10 1 x Per Week/30 Days Discharge Instructions: Apply over primary dressing as directed. Secondary Dressing: Drawtex 4x4 in 1 x Per Week/30 Days Discharge Instructions: Apply over primary dressing as directed. Secondary Dressing: Woven Gauze Sponge, Non-Sterile 4x4 in 1 x Per Week/30 Days Discharge Instructions: can use gauze between toes (if no desired) Secondary Dressing:  Zetuvit Plus 4x8 in 1 x Per Week/30 Days Discharge Instructions: Apply over primary dressing as directed. Compression Wrap: Urgo K2 Lite, (equivalent to a 3 layer) two layer compression system, regular 1 x Per Week/30 Days Discharge Instructions: Apply Urgo K2 Lite as directed (alternative to 3 layer compression). Wound #7 - T Fourth oe Wound Laterality: Plantar, Left Cleanser: Soap and Water 1 x Per Week/30 Days Discharge Instructions: May shower and wash wound with dial antibacterial soap and water prior to dressing change. Cleanser: Wound Cleanser 1 x Per Week/30 Days Discharge Instructions: Cleanse the wound with wound cleanser prior to applying a clean dressing using gauze sponges, not tissue or cotton balls. Peri-Wound Care: Sween Lotion (Moisturizing lotion) 1 x Per Week/30 Days Discharge Instructions: Apply moisturizing lotion as directed Topical: Gentamicin 1 x Per Week/30 Days Discharge Instructions: As directed by physician Topical: Mupirocin Ointment 1 x Per Week/30 Days Discharge Instructions: Apply Mupirocin (Bactroban) as instructed Topical: Ketoconazole Cream 2% 1 x Per Week/30 Days Discharge Instructions: Apply Ketoconazole as directed Topical: Triamcinolone 1 x Per Week/30 Days Discharge Instructions: Apply Triamcinolone as directed Topical: zinc 1 x Per Week/30 Days Prim Dressing: Endoform 2x2 in 1 x Per Week/30 Days ary Discharge Instructions: Moisten with saline Secondary Dressing: ABD Pad, 8x10 1 x Per Week/30 Days Discharge Instructions: Apply over primary dressing as directed. Secondary Dressing: Drawtex 4x4 in 1 x Per Week/30 Days Discharge Instructions: Apply over primary dressing as directed. Secondary Dressing: Woven Gauze Sponge, Non-Sterile 4x4 in 1 x Per Week/30 Days Discharge Instructions: can use gauze between toes (if no desired) Secondary Dressing: Zetuvit Plus 4x8 in 1 x Per Week/30 Days Discharge Instructions: Apply over primary dressing as  directed. Compression Wrap: Urgo K2 Lite, (equivalent to a 3 layer) two layer compression system, regular 1 x Per Week/30 Days Discharge Instructions: Apply Urgo K2 Lite as directed (alternative to 3 layer compression). Wound #8 - Foot Wound Laterality: Left, Lateral Cleanser: Soap and Water 1 x Per Week/30 Days Discharge Instructions: May shower and wash wound with dial antibacterial soap and water prior to dressing change. Cleanser: Wound Cleanser 1 x Per Week/30 Days Discharge Instructions: Cleanse the wound with wound cleanser prior to applying a clean dressing using gauze sponges, not tissue or cotton balls. Peri-Wound Care: Sween Lotion (Moisturizing lotion) 1 x Per Week/30 Days [name], [name] ([id number]) [phone].pdf Page 7 of 14 Discharge Instructions: Apply moisturizing lotion as directed Topical: Gentamicin 1 x Per Week/30 Days  Discharge Instructions: As directed by physician Topical: Mupirocin Ointment 1 x Per Week/30 Days Discharge Instructions: Apply Mupirocin (Bactroban) as instructed Topical: Ketoconazole Cream 2% 1 x Per Week/30 Days Discharge Instructions: Apply Ketoconazole as directed Topical: Triamcinolone 1 x Per Week/30 Days Discharge Instructions: Apply Triamcinolone as directed Topical: zinc 1 x Per Week/30 Days Prim Dressing: Endoform 2x2 in 1 x Per Week/30 Days ary Discharge Instructions: Moisten with saline Secondary Dressing: ABD Pad, 8x10 1 x Per Week/30 Days Discharge Instructions: Apply over primary dressing as directed. Secondary Dressing: Drawtex 4x4 in 1 x Per Week/30 Days Discharge Instructions: Apply over primary dressing as directed. Secondary Dressing: Woven Gauze Sponge, Non-Sterile 4x4 in 1 x Per Week/30 Days Discharge Instructions: can use gauze between toes (if no desired) Secondary Dressing: Zetuvit Plus 4x8 in 1 x Per Week/30 Days Discharge Instructions: Apply over primary dressing as directed. Compression Wrap:  Urgo K2 Lite, (equivalent to a 3 layer) two layer compression system, regular 1 x Per Week/30 Days Discharge Instructions: Apply Urgo K2 Lite as directed (alternative to 3 layer compression). Wound #9 - T Great oe Wound Laterality: Left, Lateral Topical: Gentamicin 1 x Per Week Discharge Instructions: As directed by physician Topical: Mupirocin Ointment 1 x Per Week Discharge Instructions: Apply Mupirocin (Bactroban) as instructed Prim Dressing: Maxorb Extra Ag+ Alginate Dressing, 2x2 (in/in) 1 x Per Week ary Discharge Instructions: Apply to wound bed as instructed Secondary Dressing: Woven Gauze Sponge, Non-Sterile 4x4 in 1 x Per Week Discharge Instructions: Apply over primary dressing as directed. Electronic Signature(s) Signed: 11/01/2022 12:08:46 PM By: [name] MD FACS Entered By: [name] on 11/01/2022 08:57:02 -------------------------------------------------------------------------------- Problem List Details Patient Name: Date of Service: [name], [facility] [name] 11/01/2022 8:15 A M Medical Record Number: [id number] Patient Account Number: [id number] Date of Birth/Sex: Treating RN: 07/09/1940 (82 y.o. 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 10/03/2022 No Yes without evidence of necrosis L97.525 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 11/01/2022 No Yes without evidence of necrosis 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 06/12/2022 No Yes I10 Essential (primary)  hypertension 06/12/2022 No Yes 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 06/12/2022 No Yes 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 atrial fibrillation 06/12/2022 No Yes Z79.01 Long term (current) use of anticoagulants 06/12/2022 No Yes I89.0 Lymphedema, not elsewhere classified 08/20/2022 No Yes Inactive Problems ICD-10 Code Description Active Date Inactive Date L97.521 Non-pressure chronic ulcer of other part of left foot limited to breakdown of skin 06/12/2022 06/12/2022 Resolved Problems Electronic Signature(s) Signed: 11/01/2022 8:53:57 AM By: [name] MD FACS Entered By: [name] on 11/01/2022 08:53:56 -------------------------------------------------------------------------------- Progress Note Details Patient Name: Date of Service: [name], [facility] [name] 11/01/2022 8:15 A M Medical Record Number: [id number] Patient Account Number: [id number] Date of Birth/Sex: Treating RN: Oct 31, 1940 (82 y.o. [name] Primary Care Provider: [name], [facility] Other Clinician: Referring Provider: Treating Provider/Extender: [name], [facility] Weeks in Treatment: 20 Subjective Chief Complaint Information obtained from Patient Patient seen for complaints of Non-Healing Wound. CITLALY, CAMPLIN ([id number]) [id number] Page 9 of 14 History of Present Illness (HPI) ADMISSION 06/12/2022  This is an 82 year old woman with a history of CVA, crest syndrome, atrial fibrillation, rocker-bottom foot deformity. 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 She has been followed by podiatry for this and I am not entirely clear as to how she came to be referred here. She resides in an assisted living facility. It is not clear what they have been putting on her wound, but on intake, she was noted to have denuded skin on her medial third toe, as well as ulcers on her dorsal great toe and lateral great  toe. There is slough accumulation on both of the toe ulcers. There was an odor noted at intake, but after her foot was washed, the odor dissipated. Her toes are folded on top of each other creating areas of abrasion and pockets for moisture collection, which seems to be the primary cause of her ulceration. 06/20/2022: The skin between her toes and on the ball of her foot is completely macerated. There has been more tissue breakdown. The wound on her great toe has some slough accumulation. 06/27/2022: No change to her wounds today. There has been no further deterioration, but no significant improvement. She was both hypotensive and bradycardic on intake. 07/11/2022: Today, her foot is completely macerated. She reports that the wound care nurse actually soaked her foot and then applied foam, despite our specific orders to not use foam at all. She also is draining serous fluid from both legs and has 2+ pitting edema. She is on furosemide 20 mg twice a day. 07/19/2022: Once again, her foot is completely macerated. There has been further tissue breakdown to the second and third toes and she now has ulcers on the distal ends. The wounds on her medial and lateral great toe have thick slough accumulation. Apparently Xeroform was found between her toes on intake. Edema control is improved, but still not perfect. No overt drainage from her legs appreciated on exam today. 07/27/2022: She has less tissue maceration today. Edema control in her bilateral lower extremities is improved. Still with slough accumulation on all open wound surfaces. 08/08/2022: Significant improvement this week. She has very little tissue maceration and according to her aide, there has been very little drainage from her legs. There is some slough accumulation on the medial great toe ulcer. Edema control is improved bilaterally. She is spending more time in her bed with her legs elevated and less in her wheelchair with her legs in a dependent  position. 08/20/2022: The edema in her legs is now well-controlled with the use of the zinc Unna boots. Unfortunately, this seems to have resulted in more drainage coming from the open areas of her feet. They are a bit macerated but there has not been as much tissue breakdown secondary to moisture as we have seen on previous visits. 08/28/2022: The only remaining open wound in her foot is on the dorsal great toe. The improvement in the rest of the foot is quite dramatic, with no tissue maceration or breakdown. She has been elevating her legs and wearing compression wraps. Edema control is excellent and there has been no drainage from her legs. 09/05/2022: Unfortunately, the distal half of her dorsal foot has broken down and has a layer of slough on the surface. This appears to be secondary to moisture accumulation. Her dressing was completely saturated. 09/20/2022: There has been further deterioration of her foot. The dressing that was applied was bizarre and involved Coflex foam wrapped around her foot  to the ankle and then Kerlix and Coban over that to the knee. She fortunately did have silver alginate between her toes but the tissue breakdown from moisture is extensive. 09/25/2022: There has been massive improvement in her foot since last week. The maceration has decreased, edema control is markedly better, and the wounds are showing evidence of healing, as opposed to worsening. 10/03/2022: The wound on her great toe is much smaller with minimal slough accumulation. The dorsal foot is also improving. She has a new ulcer on the plantar surface of her left fourth toe, however, and bone is exposed. Edema control and tissue maceration continues to be significantly better now that we are doing all of the dressing care. 10/09/2022: Unfortunately, it seems that the patient has resumed her habit of sitting in her wheelchair with her legs in a dependent position and there has been a lot more drainage and  maceration on her foot. The dorsal foot wounds have expanded and are deeper. She has a new wound on her second toe and although the initial wound on her great toe has healed, she has a new wound on the lateral aspect of her great toe, immediately adjacent to that on her second toe, suggesting these have been caused by friction of the 2 toes rubbing against each other. Her son is participating in this visit via speaker phone. 10/24/2022: Last week she had a nurse visit due to clinic capacity and it appears that her dressing was done differently by the nurse that saw her then how it is usually performed by her regular nurse. Unfortunately, this meant there was more moisture-related tissue breakdown. The dorsum of her foot is open again and she has a new wound on the lateral aspect of her fifth toe. There is slough accumulation in each of the sites. 11/01/2022: There has been remarkable turnaround since her last visit. All of the wounds are smaller. The wound on the dorsal aspect of her great toe has closed completely. The wounds on the top of her foot have contracted and are starting to epithelialize. The new wound on the lateral aspect of her fifth toe does have some tendon exposure today that was not appreciated at her last visit. There is more tissue over the exposed bone on the plantar surface of her fourth toe, although bone does still remain exposed. Patient History Information obtained from Patient. Family History Unknown History. Social History Former smoker - smoked when she was young, Marital Status - Widowed, Alcohol Use - Never, Drug Use - No History, Caffeine Use - Daily. Medical History Hematologic/Lymphatic Patient has history of Anemia Cardiovascular Patient has history of Angina - a-fib, Hypertension, Vasculitis Endocrine Denies history of Type I Diabetes, Type II Diabetes Musculoskeletal Patient has history of Osteoarthritis Hospitalization/Surgery History - cholecystectomy. -  leg surgery. - tonsillectomy. 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 Vitals Time Taken: 8:06 AM, Height: 67 in, Weight: 153 lbs, BMI: 24, Temperature: 98.4 F, Pulse: 97 bpm, Respiratory Rate: 18 breaths/min, Blood Pressure: 110/72 mmHg. Respiratory Normal work of breathing on room air. General Notes: 11/01/2022: There has been remarkable turnaround since her last visit. All of the wounds are smaller. The wound on the dorsal aspect of her great toe has closed completely. The wounds on the top of her foot have contracted and are starting to epithelialize. The new wound on the lateral  aspect of her fifth toe does have some tendon exposure today that was not appreciated at her last visit. There is more tissue over the exposed bone on the plantar surface of her fourth toe, although bone does still remain exposed. Integumentary (Hair, Skin) Wound #1 status is Open. Original cause of wound was Footwear Injury. The date acquired was: 05/24/2021. The wound has been in treatment 20 weeks. The wound is located on the Left,Dorsal T Great. The wound measures 0cm length x 0cm width x 0cm depth; 0cm^2 area and 0cm^3 volume. There is no tunneling oe or undermining noted. There is a none present amount of drainage noted. There is no granulation within the wound bed. There is no necrotic tissue within the wound bed. The periwound skin appearance had no abnormalities noted for texture. The periwound skin appearance had no abnormalities noted for color. The periwound skin appearance did not exhibit: Dry/Scaly, Maceration. Periwound temperature was noted as No Abnormality. Wound #6 status is Open. Original cause of wound was Gradually Appeared. The date acquired was: 09/05/2022. The  wound has been in treatment 8 weeks. The wound is located on the Left,Dorsal Foot. The wound measures 7cm length x 2.9cm width x 0.1cm depth; 15.944cm^2 area and 1.594cm^3 volume. There is Fat Layer (Subcutaneous Tissue) exposed. There is no tunneling or undermining noted. There is a medium amount of purulent drainage noted. The wound margin is indistinct and nonvisible. There is medium (34-66%) pink granulation within the wound bed. There is a medium (34-66%) amount of necrotic tissue within the wound bed including Adherent Slough. The periwound skin appearance had no abnormalities noted for texture. The periwound skin appearance had no abnormalities noted for moisture. The periwound skin appearance exhibited: Rubor. Periwound temperature was noted as Cool/Cold. The periwound has tenderness on palpation. Wound #7 status is Open. Original cause of wound was Gradually Appeared. The date acquired was: 10/03/2022. The wound has been in treatment 4 weeks. The wound is located on the Left,Plantar T Fourth. The wound measures 0.4cm length x 0.4cm width x 0.1cm depth; 0.126cm^2 area and 0.013cm^3 volume. oe There is bone and Fat Layer (Subcutaneous Tissue) exposed. There is no tunneling or undermining noted. There is a medium amount of serous drainage noted. The wound margin is flat and intact. There is no granulation within the wound bed. There is a large (67-100%) amount of necrotic tissue within the wound bed including Eschar and Adherent Slough. The periwound skin appearance had no abnormalities noted for texture. The periwound skin appearance had no abnormalities noted for moisture. The periwound skin appearance exhibited: Rubor. Periwound temperature was noted as Cool/Cold. The periwound has tenderness on palpation. Wound #8 status is Open. Original cause of wound was Pressure Injury. The date acquired was: 10/24/2022. The wound has been in treatment 1 weeks. The wound is located on the Left,Lateral  Foot. The wound measures 0.9cm length x 0.3cm width x 0.1cm depth; 0.212cm^2 area and 0.021cm^3 volume. There is a medium amount of serosanguineous drainage noted. Wound #9 status is Open. Original cause of wound was Gradually Appeared. The date acquired was: 11/01/2022. The wound is located on the Left,Lateral T oe Great. The wound measures 1.1cm length x 0.8cm width x 0.1cm depth; 0.691cm^2 area and 0.069cm^3 volume. There is Fat Layer (Subcutaneous Tissue) exposed. There is no tunneling or undermining noted. There is a medium amount of serosanguineous drainage noted. The wound margin is flat and intact. There is medium (34-66%) red granulation within the wound bed. There is a  medium (34-66%) amount of necrotic tissue within the wound bed including Adherent Slough. The periwound skin appearance had no abnormalities noted for texture. The periwound skin appearance had no abnormalities noted for moisture. The periwound skin appearance exhibited: Rubor. Periwound temperature was noted as Cool/Cold. The periwound has tenderness on palpation. 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 There was a Selective/Open Wound Non-Viable Tissue Debridement with a total area of 9.56 sq cm performed by [name], MD. With the following instrument(s): Curette to remove Non-Viable tissue/material. Material removed includes [facility] after achieving pain control using Lidocaine 4% Topical Solution. No specimens were taken. A time out was conducted at 08:40, prior to the start of the procedure. A Minimum amount of bleeding was controlled with Pressure. The procedure was tolerated well with a pain level of 5 throughout and a pain level of 4 following the procedure. Post Debridement Measurements: 7cm length x 2.9cm width x 0.1cm depth; 1.594cm^3 volume. Character of Wound/Ulcer Post Debridement is improved. Post procedure Diagnosis Wound #6: Same as Pre-Procedure General Notes: scribed for Dr. Lady Gary by Zenaida Deed, RN. Pre-procedure diagnosis of Wound #6 is a Lymphedema located on the Left,Dorsal Foot . There was a Double Layer Compression Therapy Procedure by Zenaida Deed, RN. Post procedure Diagnosis Wound #6: Same as Pre-Procedure Notes: urgo lite. Wound #7 Pre-procedure diagnosis of Wound #7 is an Atypical located on the Left,Plantar T Fourth . There was a Selective/Open Wound Non-Viable Tissue oe Debridement with a total area of 0.13 sq cm performed by [name], MD. With the following instrument(s): Curette to remove Non-Viable tissue/material. Material removed includes [facility] after achieving pain control using Lidocaine 4% Topical Solution. No specimens were taken. A time out was conducted at 08:40, prior to the start of the procedure. A Minimum amount of bleeding was controlled with Pressure. The procedure was tolerated well with a pain level of 5 throughout and a pain level of 4 following the procedure. Post Debridement Measurements: 0.4cm length x 0.4cm width x 0.1cm depth; 0.013cm^3 volume. Character of Wound/Ulcer Post Debridement is improved. Post procedure Diagnosis Wound #7: Same as Pre-Procedure General Notes: scribed for Dr. Lady Gary by Zenaida Deed, RN. Wound  #8 Pre-procedure diagnosis of Wound #8 is a Pressure Ulcer located on the Left,Lateral Foot . There was a Selective/Open Wound Non-Viable Tissue Debridement with a total area of 0.35 sq cm performed by [name], MD. With the following instrument(s): Curette to remove Non-Viable tissue/material. Material removed includes [facility] after achieving pain control using Lidocaine 4% Topical Solution. No specimens were taken. A time out was conducted at 08:40, prior to the start of the procedure. A Minimum amount of bleeding was controlled with Pressure. The procedure was tolerated well with a pain level of 5 throughout and a pain level of 4 following the procedure. Post Debridement Measurements: 0.9cm length x 0.5cm width x 0.1cm depth; 0.035cm^3 volume. Post debridement Stage noted as Unstageable/Unclassified. Character of Wound/Ulcer Post  Debridement is improved. Post procedure Diagnosis Wound #8: Same as Pre-Procedure General Notes: scribed for Dr. Lady Gary by Zenaida Deed, RN. Plan Follow-up Appointments: Return Appointment in 1 week. - Dr. Lady Gary - room 2 DO NOT CHANGE DRESSING - Anesthetic: (In clinic) Topical Lidocaine 4% applied to wound bed - USED in Clinic Bathing/ Shower/ Hygiene: May shower with protection but do not get wound dressing(s) wet. Protect dressing(s) with water repellant cover (for example, large plastic bag) or a cast cover and may then take shower. Edema Control - Lymphedema / SCD / Other: Elevate legs to the level of the heart or above for 30 minutes daily and/or when sitting for 3-4 times a day throughout the day. Avoid standing for long periods of time. Non Wound Condition: Other Non Wound Condition Orders/Instructions: - urgo lite or 3 layer compression wrap on right and left lower leg WOUND #6: - Foot Wound Laterality: Dorsal, Left Cleanser: Soap and Water 1 x Per Week/30 Days Discharge Instructions: May shower and wash wound with dial antibacterial soap and  water prior to dressing change. Cleanser: Wound Cleanser 1 x Per Week/30 Days Discharge Instructions: Cleanse the wound with wound cleanser prior to applying a clean dressing using gauze sponges, not tissue or cotton balls. Peri-Wound Care: Sween Lotion (Moisturizing lotion) 1 x Per Week/30 Days Discharge Instructions: Apply moisturizing lotion as directed Topical: Gentamicin 1 x Per Week/30 Days Discharge Instructions: As directed by physician Topical: Mupirocin Ointment 1 x Per Week/30 Days Discharge Instructions: Apply Mupirocin (Bactroban) as instructed Topical: Ketoconazole Cream 2% 1 x Per Week/30 Days Discharge Instructions: Apply Ketoconazole as directed Topical: Triamcinolone 1 x Per Week/30 Days Discharge Instructions: Apply Triamcinolone as directed Topical: zinc 1 x Per Week/30 Days Prim Dressing: Maxorb Extra Ag+ Alginate Dressing, 4x4.75 (in/in) 1 x Per Week/30 Days ary Discharge Instructions: Apply to wound bed as instructed Secondary Dressing: ABD Pad, 8x10 1 x Per Week/30 Days Discharge Instructions: Apply over primary dressing as directed. Secondary Dressing: Drawtex 4x4 in 1 x Per Week/30 Days Discharge Instructions: Apply over primary dressing as directed. Secondary Dressing: Woven Gauze Sponge, Non-Sterile 4x4 in 1 x Per Week/30 Days Discharge Instructions: can use gauze between toes (if no desired) Secondary Dressing: Zetuvit Plus 4x8 in 1 x Per Week/30 Days Discharge Instructions: Apply over primary dressing as directed. Com pression Wrap: Urgo K2 Lite, (equivalent to a 3 layer) two layer compression system, regular 1 x Per Week/30 Days Discharge Instructions: Apply Urgo K2 Lite as directed (alternative to 3 layer compression). WOUND #7: - T Fourth Wound Laterality: Plantar, Left oe Cleanser: Soap and Water 1 x Per Week/30 Days Arterberry, Shakaya ([id number]) [phone].pdf Page 12 of 14 Discharge Instructions: May shower and wash wound with  dial antibacterial soap and water prior to dressing change. Cleanser: Wound Cleanser 1 x Per Week/30 Days Discharge Instructions: Cleanse the wound with wound cleanser prior to applying a clean dressing using gauze sponges, not tissue or cotton balls. Peri-Wound Care: Sween Lotion (Moisturizing lotion) 1 x Per Week/30 Days Discharge Instructions: Apply moisturizing lotion as directed Topical: Gentamicin 1 x Per Week/30 Days Discharge Instructions: As directed by physician Topical: Mupirocin Ointment 1 x Per Week/30 Days Discharge Instructions: Apply Mupirocin (Bactroban) as instructed Topical: Ketoconazole Cream 2% 1 x Per Week/30 Days Discharge Instructions: Apply Ketoconazole as directed Topical: Triamcinolone 1 x Per Week/30 Days Discharge Instructions: Apply Triamcinolone as directed Topical: zinc 1 x Per Week/30 Days Prim Dressing: Endoform 2x2 in 1 x Per Week/30 Days ary Discharge Instructions:  Moisten with saline Secondary Dressing: ABD Pad, 8x10 1 x Per Week/30 Days Discharge Instructions: Apply over primary dressing as directed. Secondary Dressing: Drawtex 4x4 in 1 x Per Week/30 Days Discharge Instructions: Apply over primary dressing as directed. Secondary Dressing: Woven Gauze Sponge, Non-Sterile 4x4 in 1 x Per Week/30 Days Discharge Instructions: can use gauze between toes (if no desired) Secondary Dressing: Zetuvit Plus 4x8 in 1 x Per Week/30 Days Discharge Instructions: Apply over primary dressing as directed. Com pression Wrap: Urgo K2 Lite, (equivalent to a 3 layer) two layer compression system, regular 1 x Per Week/30 Days Discharge Instructions: Apply Urgo K2 Lite as directed (alternative to 3 layer compression). WOUND #8: - Foot Wound Laterality: Left, Lateral Cleanser: Soap and Water 1 x Per Week/30 Days Discharge Instructions: May shower and wash wound with dial antibacterial soap and water prior to dressing change. Cleanser: Wound Cleanser 1 x Per Week/30  Days Discharge Instructions: Cleanse the wound with wound cleanser prior to applying a clean dressing using gauze sponges, not tissue or cotton balls. Peri-Wound Care: Sween Lotion (Moisturizing lotion) 1 x Per Week/30 Days Discharge Instructions: Apply moisturizing lotion as directed Topical: Gentamicin 1 x Per Week/30 Days Discharge Instructions: As directed by physician Topical: Mupirocin Ointment 1 x Per Week/30 Days Discharge Instructions: Apply Mupirocin (Bactroban) as instructed Topical: Ketoconazole Cream 2% 1 x Per Week/30 Days Discharge Instructions: Apply Ketoconazole as directed Topical: Triamcinolone 1 x Per Week/30 Days Discharge Instructions: Apply Triamcinolone as directed Topical: zinc 1 x Per Week/30 Days Prim Dressing: Endoform 2x2 in 1 x Per Week/30 Days ary Discharge Instructions: Moisten with saline Secondary Dressing: ABD Pad, 8x10 1 x Per Week/30 Days Discharge Instructions: Apply over primary dressing as directed. Secondary Dressing: Drawtex 4x4 in 1 x Per Week/30 Days Discharge Instructions: Apply over primary dressing as directed. Secondary Dressing: Woven Gauze Sponge, Non-Sterile 4x4 in 1 x Per Week/30 Days Discharge Instructions: can use gauze between toes (if no desired) Secondary Dressing: Zetuvit Plus 4x8 in 1 x Per Week/30 Days Discharge Instructions: Apply over primary dressing as directed. Com pression Wrap: Urgo K2 Lite, (equivalent to a 3 layer) two layer compression system, regular 1 x Per Week/30 Days Discharge Instructions: Apply Urgo K2 Lite as directed (alternative to 3 layer compression). WOUND #9: - T Great Wound Laterality: Left, Lateral oe Topical: Gentamicin 1 x Per Week/ Discharge Instructions: As directed by physician Topical: Mupirocin Ointment 1 x Per Week/ Discharge Instructions: Apply Mupirocin (Bactroban) as instructed Prim Dressing: Maxorb Extra Ag+ Alginate Dressing, 2x2 (in/in) 1 x Per Week/ ary Discharge Instructions: Apply  to wound bed as instructed Secondary Dressing: Woven Gauze Sponge, Non-Sterile 4x4 in 1 x Per Week/ Discharge Instructions: Apply over primary dressing as directed. 11/01/2022: There has been remarkable turnaround since her last visit. All of the wounds are smaller. The wound on the dorsal aspect of her great toe has closed completely. The wounds on the top of her foot have contracted and are starting to epithelialize. The new wound on the lateral aspect of her fifth toe does have some tendon exposure today that was not appreciated at her last visit. There is more tissue over the exposed bone on the plantar surface of her fourth toe, although bone does still remain exposed. I used a curette to debride slough from all of the open wound surfaces. We are going to continue topical gentamicin and mupirocin to all sites with endoform on the fourth toe and lateral fifth toe wounds and silver alginate everywhere  else. We are applying the mixture of triamcinolone, zinc oxide, and ketoconazole to the periwound skin to help with moisture management. Urgo lite compression wrap. She does have an appointment with vascular this week to evaluate her arterial flow in case amputation ends up being the best long-term solution for her. She will follow-up in 1 week. Electronic Signature(s) Signed: 11/01/2022 9:02:16 AM By: [name] MD FACS Entered By: [name] on 11/01/2022 09:02:16 [name], [name] ([id number]) [id number] Page 13 of 14 -------------------------------------------------------------------------------- HxROS Details Patient Name: Date of Service: [name] 11/01/2022 8:15 A M Medical Record Number: [id number] Patient Account Number: [id number] Date of Birth/Sex: Treating RN: 16-Nov-1940 (82 y.o. 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 Endocrine Medical History: Negative for: Type I Diabetes; Type II Diabetes Past Medical History Notes: hypothyroidism Musculoskeletal Medical History: Positive for: 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 Neurologic Medical History: Past Medical History Notes: stroke Immunizations Pneumococcal Vaccine: Received Pneumococcal Vaccination: Yes Received Pneumococcal Vaccination On or After 60th Birthday: Yes Implantable Devices None Hospitalization / Surgery History Type of Hospitalization/Surgery cholecystectomy leg surgery tonsillectomy Family and Social History Unknown History: Yes; Former smoker - smoked when she was young; Marital Status - Widowed; Alcohol Use: Never; Drug Use: No History; Caffeine Use: Daily; Financial Concerns: No; Food, Clothing or Shelter Needs: No; Support System Lacking: No; Transportation Concerns: No Electronic Signature(s) Signed: 11/01/2022 12:08:46 PM By: [name] MD FACS Signed: 11/01/2022 3:51:17 PM By: Zenaida Deed RN, BSN Entered By: [name] on 11/01/2022 08:56:19 Degraffenreid, [name] ([id number]) [id number] Page 14 of 14 -------------------------------------------------------------------------------- SuperBill Details Patient Name: Date of Service: [name] 11/01/2022 Medical Record Number: [id number] Patient Account Number: [id number] Date of Birth/Sex: Treating RN: 07/07/1940 (82 y.o. 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 I10 Essential (primary) hypertension 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 atrial fibrillation Z79.01 Long term (current) use of anticoagulants I89.0 Lymphedema, not elsewhere classified Facility Procedures : CPT4 Code: 62831517 Description: 97597 - DEBRIDE WOUND 1ST 20 SQ CM OR < ICD-10 Diagnosis Description 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  witho L97.525 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 wit Modifier: ut evidence of necr hout evidence of ne Quantity: 1 osis crosis Physician Procedures : CPT4 Code Description Modifier [id number] - WC PHYS LEVEL 4 - EST PT 25 ICD-10 Diagnosis Description L97.522 Non-pressure chronic ulcer of other part of left foot with fat layer exposed L97.526 Non-pressure chronic ulcer of other part of left foot  with bone involvement without evidence of necro L97.525 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  without evidence of nec 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

## 2022-11-08 ENCOUNTER — Encounter (HOSPITAL_BASED_OUTPATIENT_CLINIC_OR_DEPARTMENT_OTHER): Payer: Medicare Other | Admitting: General Surgery

## 2022-11-08 DIAGNOSIS — L97522 Non-pressure chronic ulcer of other part of left foot with fat layer exposed: Secondary | ICD-10-CM | POA: Diagnosis not present

## 2022-11-08 NOTE — Progress Notes (Signed)
Kathryn, Joseph ([id number]) [id number] Page 1 of 14 Visit Report for 11/08/2022 Arrival Information Details Patient Name: Date of Service: [name] 11/08/2022 9:15 A M Medical Record Number: [id number] Patient Account Number: [id number] Date of Birth/Sex: Treating RN: 1940-09-08 (82 y.o. [name] Primary Care Huey Scalia: [name], [name] Other Clinician: Referring [name]: Treating [name]/Extender: [name], [facility] Weeks in Treatment: 21 Visit Information History Since Last Visit Added or deleted any medications: No Patient Arrived: Wheel Chair Any new allergies or adverse reactions: No Arrival Time: 09:09 Had a fall or experienced change in No Accompanied By: caregiver activities of daily living that may affect Transfer Assistance: Manual risk of falls: Patient Identification Verified: Yes Signs or symptoms of abuse/neglect since last visito No Secondary Verification Process Completed: Yes Hospitalized since last visit: No Patient Requires Transmission-Based Precautions: No Implantable device outside of the clinic excluding No Patient Has Alerts: No cellular tissue based products placed in the Joseph since last visit: Has Dressing in Place as Prescribed: Yes Has Compression in Place as Prescribed: Yes Pain Present Now: No Electronic Signature(s) Signed: 11/08/2022 3:55:30 PM By: [name] Entered By: [name] on 11/08/2022 09:11:46 -------------------------------------------------------------------------------- Compression Therapy Details Patient Name: Date of Service: [name] [name] [name] 11/08/2022 9:15 A M Medical Record Number: [id number] Patient Account Number: [id number] Date of Birth/Sex: Treating RN: 01-24-1941 (82 y.o. [name] Primary Care Venetia Prewitt: [name], [facility] Other Clinician: Referring [name]: Treating [name]/Extender: Duanne Joseph [name], [name] [name] Weeks in  Treatment: 21 Compression Therapy Performed for Wound Assessment: Wound #6 Left,Dorsal Foot Performed By: Clinician Kathryn Bruin, RN Compression Type: Double Layer Post Procedure Diagnosis Same as Pre-procedure Electronic Signature(s) Signed: 11/08/2022 3:55:30 PM By: [name] Entered By: [name] on 11/08/2022 09:45:23 [name], [name] ([id number]) [id number] Page 2 of 14 -------------------------------------------------------------------------------- Encounter Discharge Information Details Patient Name: Date of Service: [name] 11/08/2022 9:15 A M Medical Record Number: [id number] Patient Account Number: [id number] Date of Birth/Sex: Treating RN: 1941/02/10 (82 y.o. [name] Primary Care Pamila Mendibles: [name], [facility] Other Clinician: Referring Lliam Hoh: Treating Jezlyn Westerfield/Extender: Duanne Joseph [name], [facility] Weeks in Treatment: 35 Encounter Discharge Information Items Post Procedure Vitals Discharge Condition: Stable Temperature (F): 97.7 Ambulatory Status: Wheelchair Pulse (bpm): 111 Discharge Destination: Home Respiratory Rate (breaths/min): 18 Transportation: Private Auto Blood Pressure (mmHg): 115/78 Accompanied By: caregiver Schedule Follow-up Appointment: Yes Clinical Summary of Care: Patient Declined Electronic Signature(s) Signed: 11/08/2022 3:55:30 PM By: [name] Entered By: [name] on 11/08/2022 10:21:53 -------------------------------------------------------------------------------- Lower Extremity Assessment Details Patient Name: Date of Service: [name] [name] 11/08/2022 9:15 A M Medical Record Number: [id number] Patient Account Number: [id number] Date of Birth/Sex: Treating RN: [birth year] (82 y.o. [name] Primary Care Javi Bollman: [name], [facility] Other Clinician: Referring [name]: Treating [name]/Extender: Duanne Joseph [name], [name] Weeks in  Treatment: 21 Edema Assessment Assessed: [Left: No] [Right: No] Edema: [Left: Ye] [Right: s] Calf Left: Right: Point of Measurement: From Medial Instep 28.5 cm Ankle Left: Right: Point of Measurement: From Medial Instep 20 cm Vascular Assessment Pulses: Dorsalis Pedis Palpable: [Left:Yes] Extremity colors, hair growth, and conditions: Extremity Color: [Left:Hyperpigmented] Hair Growth on Extremity: [Left:No] Temperature of Extremity: [Left:Cool] Capillary Refill: [Left:> 3 seconds] Dependent Rubor: [Left:Yes No] Electronic Signature(s) Signed: 11/08/2022 3:55:30 PM By: [name] Entered By: [name] on 11/08/2022 09:19:49 [name], [name] ([id number]) [id number] Page 3 of 14 -------------------------------------------------------------------------------- Multi Wound Chart Details Patient Name: Date of Service: [name], [name] RO  Joseph 11/08/2022 9:15 A M Medical Record Number: [id number] Patient Account Number: [id number] Date of Birth/Sex: Treating RN: 20-Oct-1940 (82 y.o. F) Primary Care Malaiah Viramontes: [name], [facility] Other Clinician: Referring Esai Stecklein: Treating Abir Eroh/Extender: [name], [facility] Weeks in Treatment: 21 Vital Signs Height(in): 67 Pulse(bpm): 111 Weight(lbs): 153 Blood Pressure(mmHg): 115/78 Body Mass Index(BMI): 24 Temperature(F): 97.7 Respiratory Rate(breaths/min): 18 [6:Photos:] Left, Dorsal Foot Left, Plantar T Fourth oe Left, Lateral Foot Wound Location: Gradually Appeared Gradually Appeared Pressure Injury Wounding Event: Lymphedema Atypical Pressure Ulcer Primary Etiology: Anemia, Angina, Hypertension, Anemia, Angina, Hypertension, Anemia, Angina, Hypertension, Comorbid History: Vasculitis, Osteoarthritis Vasculitis, Osteoarthritis Vasculitis, Osteoarthritis 09/05/2022 10/03/2022 10/24/2022 Date Acquired: 9 5 2  Weeks of Treatment: Open Open Open Wound Status: No No No Wound  Recurrence: 8x4x0.1 0.3x0.2x0.1 1x0.8x0.3 Measurements L x W x D (cm) 25.133 0.047 0.628 A (cm) : rea 2.513 0.005 0.188 Volume (cm) : 35.40% 33.80% -220.40% % Reduction in A rea: 35.40% 28.60% -382.10% % Reduction in Volume: 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 Full Thickness With Exposed Support Unstageable/Unclassified Classification: Support Structures Structures Medium Medium Medium Exudate A mount: Serosanguineous Serous Serosanguineous Exudate Type: red, brown amber red, brown Exudate Color: Indistinct, nonvisible Flat and Intact Distinct, outline attached Wound Margin: Small (1-33%) Small (1-33%) Small (1-33%) Granulation A mount: Pink Pink Red Granulation Quality: Large (67-100%) Large (67-100%) Large (67-100%) Necrotic A mount: Adherent Slough Eschar, Adherent Slough Adherent Slough Necrotic Tissue: Fat Layer (Subcutaneous Tissue): Yes Fat Layer (Subcutaneous Tissue): Yes Fat Layer (Subcutaneous Tissue): Yes Exposed Structures: Fascia: No Fascia: No Fascia: No Tendon: No Tendon: No Tendon: No Muscle: No Muscle: No Muscle: No Joint: No Joint: No Joint: No Bone: No Bone: No Bone: No Medium (34-66%) Medium (34-66%) Small (1-33%) Epithelialization: Debridement - Selective/Open Wound Debridement - Selective/Open Wound Debridement - Selective/Open Wound Debridement: Pre-procedure Verification/Time Out 09:43 09:43 09:43 Taken: Lidocaine 4% Topical Solution Lidocaine 4% Topical Solution Lidocaine 4% Topical Solution Pain Control: [employer] Tissue Debrided: Non-Viable Tissue Non-Viable Tissue Non-Viable Tissue Level: 10.05 0.05 0.63 Debridement A (sq cm): rea Curette Curette Curette Instrument: Minimum Minimum Minimum Bleeding: Pressure Pressure Pressure Hemostasis A chieved: Procedure was tolerated well Procedure was tolerated well Procedure was tolerated well Debridement Treatment Response: 8x4x0.1 0.3x0.2x0.1 1x0.8x0.3 Post Debridement  Measurements L x W x D (cm) 2.513 0.005 0.188 Post Debridement Volume: (cm) [name] [name] Unstageable/Unclassified Post Debridement Stage: Excoriation: Yes No Abnormalities Noted No Abnormalities Noted Periwound Skin TextureLylla Joseph, [name] ([id number]) [id number] Page 4 of 14 Maceration: Yes No Abnormalities Noted Dry/Scaly: Yes Periwound Skin Moisture: Rubor: Yes Rubor: Yes No Abnormalities Noted Periwound Skin Color: Cool/Cold Cool/Cold No Abnormality Temperature: Yes Yes Yes Tenderness on Palpation: Compression Therapy Debridement Debridement Procedures Performed: Debridement Wound Number: 9 [name] [name] Photos: [name] [name] Left, Lateral T Great oe [name] [name] Wound Location: Gradually Appeared [name] [name] Wounding Event: Auto-immune [name] [name] Primary Etiology: Anemia, Angina, Hypertension, [name] [name] Comorbid History: Vasculitis, Osteoarthritis 11/01/2022 [name] [name] Date Acquired: 1 [name] [name] Weeks of Treatment: Open [name] [name] Wound Status: No [name] [name] Wound Recurrence: 0.1x0.1x0.1 [name] [name] Measurements L x W x D (cm) 0.008 [name] [name] A (cm) : rea 0.001 [name] [name] Volume (cm) : 98.80% [name] [name] % Reduction in A rea: 98.60% [name] [name] % Reduction in Volume: 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 [name] [name] Classification: Support Structures None Present [name] [name] Exudate A mount: [name] [name] [name] Exudate Type: [name] [name] [name] Exudate Color: Flat and Intact [name] [name] Wound Margin: None Present (0%) [name] [name] Granulation A mount: [name]  [name] [name] Granulation Quality: None Present (0%) [name] [name] Necrotic A mount: [name] [name] [name] Necrotic Tissue: Fascia: No [name] [name] Exposed Structures: Fat Layer (Subcutaneous Tissue): No Tendon: No Muscle: No Joint: No Bone: No Large (67-100%) [name] [name] Epithelialization: Debridement - Selective/Open Wound [name] [name] Debridement: Pre-procedure Verification/Time Out 09:43 [name] [name] Taken: Lidocaine 4% Topical Solution [name] [name] Pain Control: Slough [name] [name] Tissue  Debrided: Non-Viable Tissue [name] [name] Level: 0.01 [name] [name] Debridement A (sq cm): rea Curette [name] [name] Instrument: Minimum [name] [name] Bleeding: Pressure [name] [name] Hemostasis A chieved: Procedure was tolerated well [name] [name] Debridement Treatment Response: 0.1x0.1x0.1 [name] [name] Post Debridement Measurements L x W x D (cm) 0.001 [name] [name] Post Debridement Volume: (cm) [name] [name] [name] Post Debridement Stage: No Abnormalities Noted [name] [name] Periwound Skin Texture: No Abnormalities Noted [name] [name] Periwound Skin Moisture: Rubor: Yes [name] [name] Periwound Skin Color: Cool/Cold [name] [name] Temperature: Yes [name] [name] Tenderness on Palpation: Debridement [name] [name] Procedures Performed: Treatment Notes Electronic Signature(s) Signed: 11/08/2022 10:13:52 AM By: [name] MD FACS Entered By: Duanne Joseph on 11/08/2022 10:13:52 [name], [name] ([id number]) [id number] Page 5 of 14 -------------------------------------------------------------------------------- Multi-Disciplinary Care Plan Details Patient Name: Date of Service: [name] 11/08/2022 9:15 A M Medical Record Number: [id number] Patient Account Number: [id number] Date of Birth/Sex: Treating RN: [birth year] (82 y.o. [name] Primary Care Landy Dunnavant: [name], [facility] Other Clinician: Referring Cloyd Ragas: Treating Jourdyn Ferrin/Extender: [name], [facility] Weeks in Treatment: 21 Multidisciplinary Care Plan reviewed with physician Active Inactive Necrotic Tissue Nursing Diagnoses: Impaired tissue integrity related to necrotic/devitalized tissue Knowledge deficit related to management of necrotic/devitalized tissue Goals: Necrotic/devitalized tissue will be minimized in the wound bed Date Initiated: 06/12/2022 Target Resolution Date: 12/21/2022 Goal Status: Active Patient/caregiver will verbalize understanding of reason and process for debridement of necrotic tissue Date Initiated:  06/12/2022 Target Resolution Date: 12/21/2022 Goal Status: Active Interventions: Assess patient pain level pre-, during and post procedure and prior to discharge Provide education on necrotic tissue and debridement process Treatment Activities: Apply topical anesthetic as ordered : 06/12/2022 Notes: Wound/Skin Impairment Nursing Diagnoses: Impaired tissue integrity Knowledge deficit related to ulceration/compromised skin integrity Goals: Patient/caregiver will verbalize understanding of skin care regimen Date Initiated: 06/12/2022 Target Resolution Date: 12/21/2022 Goal Status: Active Interventions: Assess ulceration(s) every visit Treatment Activities: Skin care regimen initiated : 06/12/2022 Topical wound management initiated : 06/12/2022 Notes: Electronic Signature(s) Signed: 11/08/2022 3:55:30 PM By: [name] Entered By: [name] on 11/08/2022 09:34:06 Pizano, [name] ([id number]) [id number] Page 6 of 14 -------------------------------------------------------------------------------- Pain Assessment Details Patient Name: Date of Service: [name] 11/08/2022 9:15 A M Medical Record Number: [id number] Patient Account Number: [id number] Date of Birth/Sex: Treating RN: 05-18-1940 (82 y.o. [name] Primary Care Lajean Boese: [name], [facility] Other Clinician: Referring Datha Kissinger: Treating Mcguire Gasparyan/Extender: [name], [facility] Weeks in Treatment: 21 Active Problems Location of Pain Severity and Description of Pain Patient Has Paino No Site Locations Rate the pain. Current Pain Level: 0 Pain Management and Medication Current Pain Management: Electronic Signature(s) Signed: 11/08/2022 3:55:30 PM By: [name] Entered By: [name] on 11/08/2022 09:11:55 -------------------------------------------------------------------------------- Patient/Caregiver Education Details Patient Name: Date  of Service: [name], [facility] Joseph [name] 7/18/2024andnbsp9:15 A M Medical Record Number: [id number] Patient Account Number: [id number] Date of Birth/Gender: Treating RN: 1940/10/26 (82 y.o. [name] Primary Care Physician: [name], [facility] Other Clinician: Referring Physician: Treating Physician/Extender: [name], [facility] Joseph Weeks in Treatment: 21 Education Assessment Education  Provided To: Patient Education Topics Provided Wound/Skin Impairment: Methods: Explain/Verbal Responses: Reinforcements needed, State content correctly Electronic Signature(s) Signed: 11/08/2022 3:55:30 PM By: [name] Entered By: [name] on 11/08/2022 09:34:18 Hansmann, [name] ([id number]) [id number] Page 7 of 14 -------------------------------------------------------------------------------- Wound Assessment Details Patient Name: Date of Service: [name] 11/08/2022 9:15 A M Medical Record Number: [id number] Patient Account Number: [id number] Date of Birth/Sex: Treating RN: Feb 16, 1941 (82 y.o. [name] Primary Care Gala Padovano: [name], [facility] Other Clinician: Referring Daija Routson: Treating Vivia Rosenburg/Extender: Duanne Joseph [name], [facility] Weeks in Treatment: 21 Wound Status Wound Number: 6 Primary Etiology: Lymphedema Wound Location: Left, Dorsal Foot Wound Status: Open Wounding Event: Gradually Appeared Comorbid History: Anemia, Angina, Hypertension, Vasculitis, Osteoarthritis Date Acquired: 09/05/2022 Weeks Of Treatment: 9 Clustered Wound: No Photos Wound Measurements Length: (cm) 8 Width: (cm) 4 Depth: (cm) 0.1 Area: (cm) 25.133 Volume: (cm) 2.513 % Reduction in Area: 35.4% % Reduction in Volume: 35.4% Epithelialization: Medium (34-66%) Tunneling: No Undermining: No Wound Description Classification: 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 Support Wound Margin: Indistinct, nonvisible Exudate Amount: Medium Exudate  Type: Serosanguineous Exudate Color: red, brown Structures Foul Odor After Cleansing: No Slough/Fibrino Yes Wound Bed Granulation Amount: Small (1-33%) Exposed Structure Granulation Quality: Pink Fascia Exposed: No Necrotic Amount: Large (67-100%) Fat Layer (Subcutaneous Tissue) Exposed: Yes Necrotic Quality: Adherent Slough Tendon Exposed: No Muscle Exposed: No Joint Exposed: No Bone Exposed: No Periwound Skin Texture Texture Color No Abnormalities Noted: Yes No Abnormalities Noted: No Rubor: Yes Moisture No Abnormalities Noted: Yes Temperature / Pain Temperature: Cool/Cold Tenderness on Palpation: Yes Treatment Notes Wound #6 (Foot) Wound Laterality: Dorsal, Left Bankert, Norena ([id number]) [id number] Page 8 of 14 Cleanser Soap and Water Discharge Instruction: May shower and wash wound with dial antibacterial soap and water prior to dressing change. Wound Cleanser Discharge Instruction: Cleanse the wound with wound cleanser prior to applying a clean dressing using gauze sponges, not tissue or cotton balls. Peri-Wound Care Sween Lotion (Moisturizing lotion) Discharge Instruction: Apply moisturizing lotion as directed Topical Gentamicin Discharge Instruction: As directed by physician Mupirocin Ointment Discharge Instruction: Apply Mupirocin (Bactroban) as instructed Ketoconazole Cream 2% Discharge Instruction: Apply Ketoconazole as directed Triamcinolone Discharge Instruction: Apply Triamcinolone as directed zinc Primary Dressing Maxorb Extra Ag+ Alginate Dressing, 4x4.75 (in/in) Discharge Instruction: Apply to wound bed as instructed Secondary Dressing ABD Pad, 8x10 Discharge Instruction: Apply over primary dressing as directed. Drawtex 4x4 in Discharge Instruction: Apply over primary dressing as directed. Woven Gauze Sponge, Non-Sterile 4x4 in Discharge Instruction: can use gauze between toes (if no desired) Zetuvit Plus 4x8  in Discharge Instruction: Apply over primary dressing as directed. Secured With Compression Wrap Urgo K2 Lite, (equivalent to a 3 layer) two layer compression system, regular Discharge Instruction: Apply Urgo K2 Lite as directed (alternative to 3 layer compression). Compression Stockings Add-Ons Electronic Signature(s) Signed: 11/08/2022 3:55:30 PM By: [name] Entered By: [name] on 11/08/2022 09:27:38 -------------------------------------------------------------------------------- Wound Assessment Details Patient Name: Date of Service: [name] [facility] [name] 11/08/2022 9:15 A M Medical Record Number: [id number] Patient Account Number: [id number] Date of Birth/Sex: Treating RN: Aug 22, 1940 (82 y.o. [name] Primary Care Deja Pisarski: [name], [facility] Other Clinician: Referring Zebulen Simonis: Treating Kashonda Sarkisyan/Extender: Duanne Joseph Kathryn KES, [facility] Weeks in Treatment: 21 Wound Status Wound Number: 7 Primary Etiology: Atypical Wound Location: Left, Plantar T Fourth oe Wound Status: Open Wounding Event: Gradually Appeared Comorbid History: Anemia, Angina, Hypertension, Vasculitis, Osteoarthritis Date Acquired: 10/03/2022 Weeks Of Treatment: 5 [name], [name] ([id number]) [id number].pdf Page 9 of 14  Clustered Wound: No Photos Wound Measurements Length: (cm) 0.3 Width: (cm) 0.2 Depth: (cm) 0.1 Area: (cm) 0.047 Volume: (cm) 0.005 % Reduction in Area: 33.8% % Reduction in Volume: 28.6% Epithelialization: Medium (34-66%) Tunneling: No Undermining: No Wound Description Classification: Full Thickness With Exposed Support Structures Wound Margin: Flat and Intact Exudate Amount: Medium Exudate Type: Serous Exudate Color: amber Foul Odor After Cleansing: No Slough/Fibrino Yes Wound Bed Granulation Amount: Small (1-33%) Exposed Structure Granulation Quality: Pink Fascia Exposed: No Necrotic Amount: Large (67-100%) Fat Layer  (Subcutaneous Tissue) Exposed: Yes Necrotic Quality: Eschar, Adherent Slough Tendon Exposed: No Muscle Exposed: No Joint Exposed: No Bone Exposed: No Periwound Skin Texture Texture Color No Abnormalities Noted: Yes No Abnormalities Noted: No Rubor: Yes Moisture No Abnormalities Noted: Yes Temperature / Pain Temperature: Cool/Cold Tenderness on Palpation: Yes Treatment Notes Wound #7 (Toe Fourth) Wound Laterality: Plantar, Left Cleanser Soap and Water Discharge Instruction: May shower and wash wound with dial antibacterial soap and water prior to dressing change. Wound Cleanser Discharge Instruction: Cleanse the wound with wound cleanser prior to applying a clean dressing using gauze sponges, not tissue or cotton balls. Peri-Wound Care Sween Lotion (Moisturizing lotion) Discharge Instruction: Apply moisturizing lotion as directed Topical Gentamicin Discharge Instruction: As directed by physician Mupirocin Ointment Discharge Instruction: Apply Mupirocin (Bactroban) as instructed Ketoconazole Cream 2% Discharge Instruction: Apply Ketoconazole as directed Triamcinolone Discharge Instruction: Apply Triamcinolone as directed zinc [place], [employer] ([id number]) [id number] Page 10 of 14 Primary Dressing Maxorb Extra Ag+ Alginate Dressing, 2x2 (in/in) Discharge Instruction: Apply to wound bed as instructed Secondary Dressing ABD Pad, 8x10 Discharge Instruction: Apply over primary dressing as directed. Drawtex 4x4 in Discharge Instruction: Apply over primary dressing as directed. Woven Gauze Sponge, Non-Sterile 4x4 in Discharge Instruction: can use gauze between toes (if no desired) Zetuvit Plus 4x8 in Discharge Instruction: Apply over primary dressing as directed. Secured With Compression Wrap Urgo K2 Lite, (equivalent to a 3 layer) two layer compression system, regular Discharge Instruction: Apply Urgo K2 Lite as directed (alternative to 3 layer  compression). Compression Stockings Add-Ons Electronic Signature(s) Signed: 11/08/2022 3:55:30 PM By: [name] Entered By: [name] on 11/08/2022 09:43:59 -------------------------------------------------------------------------------- Wound Assessment Details Patient Name: Date of Service: [name] [facility] Joseph [name] 11/08/2022 9:15 A M Medical Record Number: [id number] Patient Account Number: [id number] Date of Birth/Sex: Treating RN: 15-Mar-1941 (82 y.o. [name] Primary Care Che Rachal: [name], [facility] Other Clinician: Referring Jaydon Avina: Treating Jina Olenick/Extender: Duanne Joseph [name], [facility] Weeks in Treatment: 21 Wound Status Wound Number: 8 Primary Etiology: Pressure Ulcer Wound Location: Left, Lateral Foot Wound Status: Open Wounding Event: Pressure Injury Comorbid History: Anemia, Angina, Hypertension, Vasculitis, Osteoarthritis Date Acquired: 10/24/2022 Weeks Of Treatment: 2 Clustered Wound: No Photos Wound Measurements Length: (cm) 1 Width: (cm) 0.8 Depth: (cm) 0.3 Area: (cm) 0.628 Volume: (cm) 0.188 [name], Charlyne ([id number]) % Reduction in Area: -220.4% % Reduction in Volume: -382.1% Epithelialization: Small (1-33%) Tunneling: No Undermining: No [id number] Page 11 of 14 Wound Description Classification: Unstageable/Unclassified Wound Margin: Distinct, outline attached Exudate Amount: Medium Exudate Type: Serosanguineous Exudate Color: red, brown Foul Odor After Cleansing: No Slough/Fibrino Yes Wound Bed Granulation Amount: Small (1-33%) Exposed Structure Granulation Quality: Red Fascia Exposed: No Necrotic Amount: Large (67-100%) Fat Layer (Subcutaneous Tissue) Exposed: Yes Necrotic Quality: Adherent Slough Tendon Exposed: No Muscle Exposed: No Joint Exposed: No Bone Exposed: No Periwound Skin Texture Texture Color No Abnormalities Noted: Yes No Abnormalities Noted: Yes Moisture Temperature  / Pain No Abnormalities Noted: No Temperature: No Abnormality Dry /  Scaly: Yes Tenderness on Palpation: Yes Treatment Notes Wound #8 (Foot) Wound Laterality: Left, Lateral Cleanser Soap and Water Discharge Instruction: May shower and wash wound with dial antibacterial soap and water prior to dressing change. Wound Cleanser Discharge Instruction: Cleanse the wound with wound cleanser prior to applying a clean dressing using gauze sponges, not tissue or cotton balls. Peri-Wound Care Sween Lotion (Moisturizing lotion) Discharge Instruction: Apply moisturizing lotion as directed Topical Gentamicin Discharge Instruction: As directed by physician Mupirocin Ointment Discharge Instruction: Apply Mupirocin (Bactroban) as instructed Ketoconazole Cream 2% Discharge Instruction: Apply Ketoconazole as directed Triamcinolone Discharge Instruction: Apply Triamcinolone as directed zinc Primary Dressing Endoform 2x2 in Discharge Instruction: Moisten with saline Secondary Dressing ABD Pad, 8x10 Discharge Instruction: Apply over primary dressing as directed. Drawtex 4x4 in Discharge Instruction: Apply over primary dressing as directed. Woven Gauze Sponge, Non-Sterile 4x4 in Discharge Instruction: can use gauze between toes (if no desired) Zetuvit Plus 4x8 in Discharge Instruction: Apply over primary dressing as directed. Secured With Compression Wrap Urgo K2 Lite, (equivalent to a 3 layer) two layer compression system, regular Discharge Instruction: Apply Urgo K2 Lite as directed (alternative to 3 layer compression). Compression Stockings Add-Ons [name], [name] ([id number]) [id number] Page 12 of 14 Electronic Signature(s) Signed: 11/08/2022 3:55:30 PM By: Gelene Mink By: [name] on 11/08/2022 09:28:52 -------------------------------------------------------------------------------- Wound Assessment Details Patient Name: Date of  Service: [name] [facility] [name] 11/08/2022 9:15 A M Medical Record Number: [id number] Patient Account Number: [id number] Date of Birth/Sex: Treating RN: 08/18/1940 (82 y.o. [name] Primary Care Sonnia Strong: [name], [facility] Other Clinician: Referring Humzah Harty: Treating Jakiera Ehler/Extender: Duanne Joseph [name], [facility] Weeks in Treatment: 21 Wound Status Wound Number: 9 Primary Etiology: Auto-immune Wound Location: Left, Lateral T [place] Wound Status: Open Wounding Event: Gradually Appeared Comorbid History: Anemia, Angina, Hypertension, Vasculitis, Osteoarthritis Date Acquired: 11/01/2022 Weeks Of Treatment: 1 Clustered Wound: No Photos Wound Measurements Length: (cm) 0.8 Width: (cm) 0.8 Depth: (cm) 0.1 Area: (cm) 0.503 Volume: (cm) 0.05 % Reduction in Area: 27.2% % Reduction in Volume: 27.5% Epithelialization: Large (67-100%) Tunneling: No Undermining: No Wound Description Classification: 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 Support Structures Wound Margin: Flat and Intact Exudate Amount: None Present Foul Odor After Cleansing: No Slough/Fibrino No Wound Bed Granulation Amount: None Present (0%) Exposed Structure Necrotic Amount: None Present (0%) Fascia Exposed: No Fat Layer (Subcutaneous Tissue) Exposed: No Tendon Exposed: No Muscle Exposed: No Joint Exposed: No Bone Exposed: No Periwound Skin Texture Texture Color No Abnormalities Noted: Yes No Abnormalities Noted: No Rubor: Yes Moisture No Abnormalities Noted: Yes Temperature / Pain Temperature: Cool/Cold Tenderness on Palpation: Yes [place], [place] ([id number]) [id number] Page 13 of 14 Treatment Notes Wound #9 (Toe Great) Wound Laterality: Left, Lateral Cleanser Soap and Water Discharge Instruction: May shower and wash wound with dial antibacterial soap and water prior to dressing change. Wound Cleanser Discharge Instruction: Cleanse the wound with wound cleanser prior to  applying a clean dressing using gauze sponges, not tissue or cotton balls. Peri-Wound Care Sween Lotion (Moisturizing lotion) Discharge Instruction: Apply moisturizing lotion as directed Topical Gentamicin Discharge Instruction: As directed by physician Mupirocin Ointment Discharge Instruction: Apply Mupirocin (Bactroban) as instructed Ketoconazole Cream 2% Discharge Instruction: Apply Ketoconazole as directed Triamcinolone Discharge Instruction: Apply Triamcinolone as directed zinc Primary Dressing Maxorb Extra Ag+ Alginate Dressing, 2x2 (in/in) Discharge Instruction: Apply to wound bed as instructed Secondary Dressing ABD Pad, 8x10 Discharge Instruction: Apply over primary dressing as directed. Drawtex 4x4 in Discharge Instruction: Apply over primary dressing as directed.  Woven Gauze Sponge, Non-Sterile 4x4 in Discharge Instruction: can use gauze between toes (if no desired) Zetuvit Plus 4x8 in Discharge Instruction: Apply over primary dressing as directed. Secured With Compression Wrap Urgo K2 Lite, (equivalent to a 3 layer) two layer compression system, regular Discharge Instruction: Apply Urgo K2 Lite as directed (alternative to 3 layer compression). Compression Stockings Add-Ons Electronic Signature(s) Signed: 11/08/2022 3:55:30 PM By: [name] Entered By: [name] on 11/08/2022 10:20:31 -------------------------------------------------------------------------------- Vitals Details Patient Name: Date of Service: [name], [facility] [name] 11/08/2022 9:15 A M Medical Record Number: [id number] Patient Account Number: [id number] Date of Birth/Sex: Treating RN: [birth year] (82 y.o. [name] Primary Care Kannen Moxey: [name], [facility] Other Clinician: Referring Callen Zuba: Treating Terril Amaro/Extender: Duanne Joseph Kathryn KES, [facility] Weeks in Treatment: 21 Vital Signs Time Taken: 09:12 Temperature (F): 97.7 Lipsitz, Collene ([id number])  [phone].pdf Page 14 of 14 Height (in): 67 Pulse (bpm): 111 Weight (lbs): 153 Respiratory Rate (breaths/min): 18 Body Mass Index (BMI): 24 Blood Pressure (mmHg): 115/78 Reference Range: 80 - 120 mg / dl Electronic Signature(s) Signed: 11/08/2022 3:55:30 PM By: [name] Entered By: [name] on 11/08/2022 09:13:53

## 2022-11-08 NOTE — Progress Notes (Signed)
HARSHITHA, FRETZ ([id number]) [id number] Page 1 of 17 Visit Report for 11/08/2022 Chief Complaint Document Details Patient Name: Date of Service: [name] RO N 11/08/2022 9:15 A M Medical Record Number: [id number] Patient Account Number: [id number] Date of Birth/Sex: Treating RN: October 02, 1940 (82 y.o. F) Primary Care Provider: [name], [facility] Other Clinician: Referring Provider: Treating Provider/Extender: [name], [facility] Weeks in Treatment: 21 Information Obtained from: Patient Chief Complaint Patient seen for complaints of Non-Healing Wound. Electronic Signature(s) Signed: 11/08/2022 10:14:45 AM By: [name] MD FACS Entered By: [name] on 11/08/2022 10:14:45 -------------------------------------------------------------------------------- Debridement Details Patient Name: Date of Service: [name], [facility] RO N 11/08/2022 9:15 A M Medical Record Number: [id number] Patient Account Number: [id number] Date of Birth/Sex: Treating RN: July 05, 1940 (82 y.o. [name] Primary Care Provider: [name], [facility] Other Clinician: Referring Provider: Treating Provider/Extender: [name], [facility] Weeks in Treatment: 21 Debridement Performed for Assessment: Wound #8 Left,Lateral Foot Performed By: Physician [name], MD Debridement Type: Debridement Level of Consciousness (Pre-procedure): Awake and Alert Pre-procedure Verification/Time Out Yes - 09:43 Taken: Start Time: 09:43 Pain Control: Lidocaine 4% T opical Solution Percent of Wound Bed Debrided: 100% T Area Debrided (cm): otal 0.63 Tissue and other material debrided: Non-Viable, Slough, Slough Level: Non-Viable Tissue Debridement Description: Selective/Open Wound Instrument: Curette Bleeding: Minimum Hemostasis Achieved: Pressure Response to Treatment: Procedure was tolerated well Level of Consciousness (Post- Awake and Alert procedure): Post  Debridement Measurements of Total Wound Length: (cm) 1 Stage: Unstageable/Unclassified Width: (cm) 0.8 Depth: (cm) 0.3 Volume: (cm) 0.188 Character of Wound/Ulcer Post Debridement: Improved Post Procedure Diagnosis Same as Pre-procedure TUTOR, Liane ([id number]) [phone].pdf Page 2 of 17 Notes scribed for Dr. Lady Gary by [name], RN Electronic Signature(s) Signed: 11/08/2022 3:55:30 PM By: [name] Signed: 11/08/2022 4:23:46 PM By: [name] MD FACS Entered By: [name] on 11/08/2022 09:43:41 -------------------------------------------------------------------------------- Debridement Details Patient Name: Date of Service: [name], [facility] RO N 11/08/2022 9:15 A M Medical Record Number: [id number] Patient Account Number: [id number] Date of Birth/Sex: Treating RN: 05/31/1940 (82 y.o. [name] Primary Care Provider: [name], [facility] Other Clinician: Referring Provider: Treating Provider/Extender: [name], [facility] Weeks in Treatment: 21 Debridement Performed for Assessment: Wound #7 Left,Plantar T Fourth oe Performed By: Physician [name], MD Debridement Type: Debridement Level of Consciousness (Pre-procedure): Awake and Alert Pre-procedure Verification/Time Out Yes - 09:43 Taken: Start Time: 09:43 Pain Control: Lidocaine 4% T opical Solution Percent of Wound Bed Debrided: 100% T Area Debrided (cm): otal 0.05 Tissue and other material debrided: Non-Viable, Slough, Slough Level: Non-Viable Tissue Debridement Description: Selective/Open Wound Instrument: Curette Bleeding: Minimum Hemostasis Achieved: Pressure Response to Treatment: Procedure was tolerated well Level of Consciousness (Post- Awake and Alert procedure): Post Debridement Measurements of Total Wound Length: (cm) 0.3 Width: (cm) 0.2 Depth: (cm) 0.1 Volume: (cm) 0.005 Character of Wound/Ulcer Post Debridement:  Improved Post Procedure Diagnosis Same as Pre-procedure Notes scribed for Dr. Lady Gary by [name], RN Electronic Signature(s) Signed: 11/08/2022 3:55:30 PM By: [name] Signed: 11/08/2022 4:23:46 PM By: [name] MD FACS Entered By: [name] on 11/08/2022 09:44:27 -------------------------------------------------------------------------------- Debridement Details Patient Name: Date of Service: [name], [facility] RO N 11/08/2022 9:15 A M Medical Record Number: [id number] Patient Account Number: [id number] LATRESSA, HARRIES ([id number]) [id number] Page 3 of 17 Date of Birth/Sex: Treating RN: [birth year] (82 y.o. [name] Primary Care Provider: [name], [facility] Other Clinician: Referring Provider: Treating Provider/Extender: [name] SHO KES, [facility] Weeks in Treatment: 79  Debridement Performed for Assessment: Wound #6 Left,Dorsal Foot Performed By: Physician [name], MD Debridement Type: Debridement Level of Consciousness (Pre-procedure): Awake and Alert Pre-procedure Verification/Time Out Yes - 09:43 Taken: Start Time: 09:43 Pain Control: Lidocaine 4% T opical Solution Percent of Wound Bed Debrided: 40% T Area Debrided (cm): otal 10.05 Tissue and other material debrided: Non-Viable, Slough, Slough Level: Non-Viable Tissue Debridement Description: Selective/Open Wound Instrument: Curette Bleeding: Minimum Hemostasis Achieved: Pressure Response to Treatment: Procedure was tolerated well Level of Consciousness (Post- Awake and Alert procedure): Post Debridement Measurements of Total Wound Length: (cm) 8 Width: (cm) 4 Depth: (cm) 0.1 Volume: (cm) 2.513 Character of Wound/Ulcer Post Debridement: Improved Post Procedure Diagnosis Same as Pre-procedure Notes scribed for Dr. Lady Gary by [name], RN Electronic Signature(s) Signed: 11/08/2022 3:55:30 PM By: [name] Signed:  11/08/2022 4:23:46 PM By: [name] MD FACS Entered By: [name] on 11/08/2022 09:45:10 -------------------------------------------------------------------------------- Debridement Details Patient Name: Date of Service: [name], [facility] RO N 11/08/2022 9:15 A M Medical Record Number: [id number] Patient Account Number: [id number] Date of Birth/Sex: Treating RN: 07-16-1940 (82 y.o. [name] Primary Care Provider: [name], [facility] Other Clinician: Referring Provider: Treating Provider/Extender: [name], [facility] Weeks in Treatment: 21 Debridement Performed for Assessment: Wound #9 Left,Lateral T Great oe Performed By: Physician [name], MD Debridement Type: Debridement Level of Consciousness (Pre-procedure): Awake and Alert Pre-procedure Verification/Time Out Yes - 09:43 Taken: Start Time: 09:43 Pain Control: Lidocaine 4% T opical Solution Percent of Wound Bed Debrided: 100% T Area Debrided (cm): otal 0.01 Tissue and other material debrided: Non-Viable, Slough, Slough Level: Non-Viable Tissue Debridement Description: Selective/Open Wound Instrument: Curette Bleeding: Minimum Hemostasis Achieved: Pressure Response to Treatment: Procedure was tolerated well Level of Consciousness Corley Kohls, [place] ([id number]) [phone].pdf Page 4 of 17 Level of Consciousness (Post- Awake and Alert procedure): Post Debridement Measurements of Total Wound Length: (cm) 0.1 Width: (cm) 0.1 Depth: (cm) 0.1 Volume: (cm) 0.001 Character of Wound/Ulcer Post Debridement: Improved Post Procedure Diagnosis Same as Pre-procedure Notes scribed for Dr. Lady Gary by [name], RN Electronic Signature(s) Signed: 11/08/2022 3:55:30 PM By: [name] Signed: 11/08/2022 4:23:46 PM By: [name] MD FACS Entered By: [name] on 11/08/2022  09:46:23 -------------------------------------------------------------------------------- HPI Details Patient Name: Date of Service: [name], [facility] RO N 11/08/2022 9:15 A M Medical Record Number: [id number] Patient Account Number: [id number] Date of Birth/Sex: Treating RN: 1941-02-07 (82 y.o. F) Primary Care Provider: [name], [facility] Other Clinician: Referring Provider: Treating Provider/Extender: [name], [facility] Weeks in Treatment: 21 History of Present Illness HPI Description: ADMISSION 06/12/2022 This is an 82 year old woman with a history of CVA, crest syndrome, atrial fibrillation, rocker-bottom foot deformity. 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 She has been followed by podiatry for this and I am not entirely clear as to how she came to be referred here. She resides in an assisted living facility. It is not clear what they have been putting on her wound, but on intake, she was noted to have denuded skin on her medial third toe, as well as ulcers on her dorsal great toe and lateral great toe. There is slough accumulation on both of the toe ulcers. There was an odor noted at intake, but after her foot was washed, the odor dissipated. Her toes are folded on top of each other creating areas of abrasion and pockets for moisture collection, which seems to be the primary cause of her ulceration.  06/20/2022: The skin between her toes and on the ball of her foot is completely macerated. There has been more tissue breakdown. The wound on her great toe has some slough accumulation. 06/27/2022: No change to her wounds today. There has been no further deterioration, but no significant improvement. She was both hypotensive and bradycardic on intake. 07/11/2022: Today, her foot is completely macerated. She reports that the wound care nurse actually soaked her foot and then applied foam, despite our specific orders to not use foam at all.  She also is draining serous fluid from both legs and has 2+ pitting edema. She is on furosemide 20 mg twice a day. 07/19/2022: Once again, her foot is completely macerated. There has been further tissue breakdown to the second and third toes and she now has ulcers on the distal ends. The wounds on her medial and lateral great toe have thick slough accumulation. Apparently Xeroform was found between her toes on intake. Edema control is improved, but still not perfect. No overt drainage from her legs appreciated on exam today. 07/27/2022: She has less tissue maceration today. Edema control in her bilateral lower extremities is improved. Still with slough accumulation on all open wound surfaces. 08/08/2022: Significant improvement this week. She has very little tissue maceration and according to her aide, there has been very little drainage from her legs. There is some slough accumulation on the medial great toe ulcer. Edema control is improved bilaterally. She is spending more time in her bed with her legs elevated and less in her wheelchair with her legs in a dependent position. 08/20/2022: The edema in her legs is now well-controlled with the use of the zinc Unna boots. Unfortunately, this seems to have resulted in more drainage coming from the open areas of her feet. They are a bit macerated but there has not been as much tissue breakdown secondary to moisture as we have seen on previous visits. 08/28/2022: The only remaining open wound in her foot is on the dorsal great toe. The improvement in the rest of the foot is quite dramatic, with no tissue maceration or breakdown. She has been elevating her legs and wearing compression wraps. Edema control is excellent and there has been no drainage from her legs. 09/05/2022: Unfortunately, the distal half of her dorsal foot has broken down and has a layer of slough on the surface. This appears to be secondary to moisture accumulation. Her dressing was completely  saturated. 09/20/2022: There has been further deterioration of her foot. The dressing that was applied was bizarre and involved Coflex foam wrapped around her foot to the Bowman, Dossie ([id number]) [phone].pdf Page 5 of 17 ankle and then Kerlix and Coban over that to the knee. She fortunately did have silver alginate between her toes but the tissue breakdown from moisture is extensive. 09/25/2022: There has been massive improvement in her foot since last week. The maceration has decreased, edema control is markedly better, and the wounds are showing evidence of healing, as opposed to worsening. 10/03/2022: The wound on her great toe is much smaller with minimal slough accumulation. The dorsal foot is also improving. She has a new ulcer on the plantar surface of her left fourth toe, however, and bone is exposed. Edema control and tissue maceration continues to be significantly better now that we are doing all of the dressing care. 10/09/2022: Unfortunately, it seems that the patient has resumed her habit of sitting in her wheelchair with her legs in a dependent position and there has  been a lot more drainage and maceration on her foot. The dorsal foot wounds have expanded and are deeper. She has a new wound on her second toe and although the initial wound on her great toe has healed, she has a new wound on the lateral aspect of her great toe, immediately adjacent to that on her second toe, suggesting these have been caused by friction of the 2 toes rubbing against each other. Her son is participating in this visit via speaker phone. 10/24/2022: Last week she had a nurse visit due to clinic capacity and it appears that her dressing was done differently by the nurse that saw her then how it is usually performed by her regular nurse. Unfortunately, this meant there was more moisture-related tissue breakdown. The dorsum of her foot is open again and she has a new wound on the  lateral aspect of her fifth toe. There is slough accumulation in each of the sites. 11/01/2022: There has been remarkable turnaround since her last visit. All of the wounds are smaller. The wound on the dorsal aspect of her great toe has closed completely. The wounds on the top of her foot have contracted and are starting to epithelialize. The new wound on the lateral aspect of her fifth toe does have some tendon exposure today that was not appreciated at her last visit. There is more tissue over the exposed bone on the plantar surface of her fourth toe, although bone does still remain exposed. 11/08/2022: All of her wounds are looking better again this week. The exposed bone on the plantar surface of the fourth toe has now been covered with soft tissue. The dorsal foot wounds are epithelializing nicely. The lateral fifth toe wound is smaller. There is slough on all of the surfaces. She had ABIs done in the vascular lab last week. Results are copied here: ABI Findings: +---------+------------------+-----+----------+--------+ Right Rt Pressure (mmHg)IndexWaveform Comment  +---------+------------------+-----+----------+--------+ Brachial 132     +---------+------------------+-----+----------+--------+ PTA 108 0.82 monophasic  +---------+------------------+-----+----------+--------+ DP 81 0.61 monophasic  +---------+------------------+-----+----------+--------+ Great T oe24 0.18 Abnormal   +---------+------------------+-----+----------+--------+ +---------+------------------+-----+----------+--------------------------------+ Left Lt Pressure (mmHg)IndexWaveform Comment  +---------+------------------+-----+----------+--------------------------------+ Brachial 121     +---------+------------------+-----+----------+--------------------------------+ PTA 88 0.67 monophasic   +---------+------------------+-----+----------+--------------------------------+ DP 111 0.84 monophasic  +---------+------------------+-----+----------+--------------------------------+ Great T   unable to obtain due to  oe     bandaging/ wound  +---------+------------------+-----+----------+--------------------------------+ +-------+-----------+-----------+------------+------------+ ABI/TBIT oday's ABIT oday's TBIPrevious ABIPrevious TBI +-------+-----------+-----------+------------+------------+ Right 0.82 0.18    +-------+-----------+-----------+------------+------------+ Left 0.84     +-------+-----------+-----------+------------+------------+ Summary: Right: Resting right ankle-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 The right toe-brachial index is abnormal. Left: Resting left ankle-brachial index indicates mild left lower extremity arterial disease. Unable to obtain TBI due to bandaging/wound. *See table(s) above for measurements and observations. Electronic Signature(s) Signed: 11/08/2022 10:15:58 AM By: [name] MD FACS Entered By: [name] on 11/08/2022 10:15:58 -------------------------------------------------------------------------------- Physical Exam Details Patient Name: Date of Service: [name] [facility] RO N 11/08/2022 9:15 A MELONIE GERMANI, [name] ([id number]) [id number] Page 6 of 17 Medical Record Number: [id number] Patient Account Number: [id number] Date of Birth/Sex: Treating RN: 01/23/1941 (82 y.o. F) Primary Care Provider: [name], [facility] Other Clinician: Referring Provider: Treating Provider/Extender: [name] SHO KES, [facility] Weeks in Treatment: 21 Constitutional . Slightly tachycardic. . . no acute distress. Respiratory Normal work of breathing on room air. Notes 11/08/2022: All of her wounds are looking better again this week. The exposed bone on  the plantar surface of the fourth toe has now been covered with soft  tissue. The dorsal foot wounds are epithelializing nicely. The lateral fifth toe wound is smaller. There is slough on all of the surfaces. Electronic Signature(s) Signed: 11/08/2022 10:19:31 AM By: [name] MD FACS Entered By: [name] on 11/08/2022 10:19:31 -------------------------------------------------------------------------------- Physician Orders Details Patient Name: Date of Service: [name], [facility] RO N 11/08/2022 9:15 A M Medical Record Number: [id number] Patient Account Number: [id number] Date of Birth/Sex: Treating RN: 1941-01-03 (82 y.o. 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 - Dr. Lady Gary - room 2 DO NOT CHANGE DRESSING - Return A Anesthetic (In clinic) Topical Lidocaine 4% applied to wound bed - USED in Clinic Bathing/ Shower/ Hygiene May shower with protection but do not get wound dressing(s) wet. Protect dressing(s) with water repellant cover (for example, large plastic bag) or a cast cover and may then take shower. Edema Control - Lymphedema / SCD /  Other Elevate legs to the level of the heart or above for 30 minutes daily and/or when sitting for 3-4 times a day throughout the day. Avoid standing for long periods of time. Non Wound Condition Right Lower Extremity Other Non Wound Condition Orders/Instructions: - urgo lite or 3 layer compression wrap on right and left lower leg Wound Treatment Wound #6 - Foot Wound Laterality: Dorsal, Left Cleanser: Soap and Water 1 x Per Week/30 Days Sindt, Jisele ([id number]) [id number].pdf Page 7 of 17 Discharge Instructions: May shower and wash wound with dial antibacterial soap and water prior to dressing change. Cleanser: Wound Cleanser 1 x Per Week/30 Days Discharge Instructions: Cleanse the wound with wound cleanser prior to applying a clean dressing using gauze sponges, not tissue or cotton balls. Peri-Wound Care: Sween Lotion (Moisturizing lotion) 1 x Per Week/30 Days Discharge Instructions: Apply moisturizing lotion as directed Topical: Gentamicin 1 x Per Week/30 Days Discharge Instructions: As directed by physician Topical: Mupirocin Ointment 1 x Per Week/30 Days Discharge Instructions: Apply Mupirocin (Bactroban) as instructed Topical: Ketoconazole Cream 2% 1 x Per Week/30 Days Discharge Instructions: Apply Ketoconazole as directed Topical: Triamcinolone 1 x Per Week/30 Days Discharge Instructions: Apply Triamcinolone as directed Topical: zinc 1 x Per Week/30 Days Prim Dressing: Maxorb Extra Ag+ Alginate Dressing, 4x4.75 (in/in) 1 x Per Week/30 Days ary Discharge Instructions: Apply to wound bed as instructed Secondary Dressing: ABD Pad, 8x10 1 x Per Week/30 Days Discharge Instructions: Apply over primary dressing as directed. Secondary Dressing: Drawtex 4x4 in 1 x Per Week/30 Days Discharge Instructions: Apply over primary dressing as directed. Secondary Dressing: Woven Gauze Sponge, Non-Sterile 4x4 in 1 x Per Week/30 Days Discharge Instructions: can use  gauze between toes (if no desired) Secondary Dressing: Zetuvit Plus 4x8 in 1 x Per Week/30 Days Discharge Instructions: Apply over primary dressing as directed. Compression Wrap: Urgo K2 Lite, (equivalent to a 3 layer) two layer compression system, regular 1 x  Per Week/30 Days Discharge Instructions: Apply Urgo K2 Lite as directed (alternative to 3 layer compression). Wound #7 - T Fourth oe Wound Laterality: Plantar, Left Cleanser: Soap and Water 1 x Per Week/30 Days Discharge Instructions: May shower and wash wound with dial antibacterial soap and water prior to dressing change. Cleanser: Wound Cleanser 1 x Per Week/30 Days Discharge Instructions: Cleanse the wound with wound cleanser prior to applying a clean dressing using gauze sponges, not tissue or cotton balls. Peri-Wound Care: Sween Lotion (Moisturizing lotion) 1 x Per Week/30 Days Discharge Instructions: Apply moisturizing lotion as directed Topical: Gentamicin 1 x Per Week/30 Days Discharge Instructions: As directed by physician Topical: Mupirocin Ointment 1 x Per Week/30 Days Discharge Instructions: Apply Mupirocin (Bactroban) as instructed Topical: Ketoconazole Cream 2% 1 x Per Week/30 Days Discharge Instructions: Apply Ketoconazole as directed Topical: Triamcinolone 1 x Per Week/30 Days Discharge Instructions: Apply Triamcinolone as directed Topical: zinc 1 x Per Week/30 Days Prim Dressing: Maxorb Extra Ag+ Alginate Dressing, 2x2 (in/in) 1 x Per Week/30 Days ary Discharge Instructions: Apply to wound bed as instructed Secondary Dressing: ABD Pad, 8x10 1 x Per Week/30 Days Discharge Instructions: Apply over primary dressing as directed. Secondary Dressing: Drawtex 4x4 in 1 x Per Week/30 Days Discharge Instructions: Apply over primary dressing as directed. Secondary Dressing: Woven Gauze Sponge, Non-Sterile 4x4 in 1 x Per Week/30 Days Discharge Instructions: can use gauze between toes (if no desired) Secondary Dressing:  Zetuvit Plus 4x8 in 1 x Per Week/30 Days Jeune, [name] ([id number]) [phone].pdf Page 8 of 17 Discharge Instructions: Apply over primary dressing as directed. Compression Wrap: Urgo K2 Lite, (equivalent to a 3 layer) two layer compression system, regular 1 x Per Week/30 Days Discharge Instructions: Apply Urgo K2 Lite as directed (alternative to 3 layer compression). Wound #8 - Foot Wound Laterality: Left, Lateral Cleanser: Soap and Water 1 x Per Week/30 Days Discharge Instructions: May shower and wash wound with dial antibacterial soap and water prior to dressing change. Cleanser: Wound Cleanser 1 x Per Week/30 Days Discharge Instructions: Cleanse the wound with wound cleanser prior to applying a clean dressing using gauze sponges, not tissue or cotton balls. Peri-Wound Care: Sween Lotion (Moisturizing lotion) 1 x Per Week/30 Days Discharge Instructions: Apply moisturizing lotion as directed Topical: Gentamicin 1 x Per Week/30 Days Discharge Instructions: As directed by physician Topical: Mupirocin Ointment 1 x Per Week/30 Days Discharge Instructions: Apply Mupirocin (Bactroban) as instructed Topical: Ketoconazole Cream 2% 1 x Per Week/30 Days Discharge Instructions: Apply Ketoconazole as directed Topical: Triamcinolone 1 x Per Week/30 Days Discharge Instructions: Apply Triamcinolone as directed Topical: zinc 1 x Per Week/30 Days Prim Dressing: Endoform 2x2 in 1 x Per Week/30 Days ary Discharge Instructions: Moisten with saline Secondary Dressing: ABD Pad, 8x10 1 x Per Week/30 Days Discharge Instructions: Apply over primary dressing as directed. Secondary Dressing: Drawtex 4x4 in 1 x Per Week/30 Days Discharge Instructions: Apply over primary dressing as directed. Secondary Dressing: Woven Gauze Sponge, Non-Sterile 4x4 in 1 x Per Week/30 Days Discharge Instructions: can use gauze between toes (if no desired) Secondary Dressing: Zetuvit Plus 4x8 in 1 x Per  Week/30 Days Discharge Instructions: Apply over primary dressing as directed. Compression Wrap: Urgo K2 Lite, (equivalent to a 3 layer) two layer compression system, regular 1 x Per Week/30 Days Discharge Instructions: Apply Urgo K2 Lite as directed (alternative to 3 layer compression). Wound #9 - T Great oe Wound Laterality: Left, Lateral Cleanser: Soap and Water 1 x Per Week/30 Days Discharge Instructions:  May shower and wash wound with dial antibacterial soap and water prior to dressing change. Cleanser: Wound Cleanser 1 x Per Week/30 Days Discharge Instructions: Cleanse the wound with wound cleanser prior to applying a clean dressing using gauze sponges, not tissue or cotton balls. Peri-Wound Care: Sween Lotion (Moisturizing lotion) 1 x Per Week/30 Days Discharge Instructions: Apply moisturizing lotion as directed Topical: Gentamicin 1 x Per Week/30 Days Discharge Instructions: As directed by physician Topical: Mupirocin Ointment 1 x Per Week/30 Days Discharge Instructions: Apply Mupirocin (Bactroban) as instructed Topical: Ketoconazole Cream 2% 1 x Per Week/30 Days Discharge Instructions: Apply Ketoconazole as directed Topical: Triamcinolone 1 x Per Week/30 Days Discharge Instructions: Apply Triamcinolone as directed Topical: zinc 1 x Per Week/30 Days Prim Dressing: Maxorb Extra Ag+ Alginate Dressing, 2x2 (in/in) 1 x Per Week/30 Days ary Discharge Instructions: Apply to wound bed as instructed Secondary Dressing: ABD Pad, 8x10 1 x Per Week/30 Days Discharge Instructions: Apply over primary dressing as directed. JAKIRAH, [name] ([id number]) [id number] Page 9 of 17 Secondary Dressing: Drawtex 4x4 in 1 x Per Week/30 Days Discharge Instructions: Apply over primary dressing as directed. Secondary Dressing: Woven Gauze Sponge, Non-Sterile 4x4 in 1 x Per Week/30 Days Discharge Instructions: can use gauze between toes (if no desired) Secondary Dressing:  Zetuvit Plus 4x8 in 1 x Per Week/30 Days Discharge Instructions: Apply over primary dressing as directed. Compression Wrap: Urgo K2 Lite, (equivalent to a 3 layer) two layer compression system, regular 1 x Per Week/30 Days Discharge Instructions: Apply Urgo K2 Lite as directed (alternative to 3 layer compression). Consults Vascular Surgery - referral for diminished ABIs, TBIs, and wound tissue loss Patient Medications llergies: Sulfa (Sulfonamide Antibiotics) A Notifications Medication Indication Start End 11/08/2022 lidocaine DOSE topical 4 % cream - cream topical Electronic Signature(s) Signed: 11/08/2022 4:23:46 PM By: [name] MD FACS Entered By: [name] on 11/08/2022 10:20:59 -------------------------------------------------------------------------------- Problem List Details Patient Name: Date of Service: [name], [facility] RO N 11/08/2022 9:15 A M Medical Record Number: [id number] Patient Account Number: [id number] Date of Birth/Sex: Treating RN: [birth year] (82 y.o. 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 11/01/2022 No Yes without evidence of necrosis 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 06/12/2022 No Yes I10 Essential (primary) hypertension 06/12/2022 No Yes 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 06/12/2022 No Yes Oshita, Moniqua ([id number]) [phone].pdf Page 10 of 17 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 atrial  fibrillation 06/12/2022 No Yes Z79.01 Long term (current) use of anticoagulants 06/12/2022 No Yes I89.0 Lymphedema, not elsewhere classified 08/20/2022 No Yes Inactive Problems ICD-10 Code Description Active Date Inactive Date L97.521 Non-pressure chronic ulcer of other part of left foot limited to breakdown of skin 06/12/2022 06/12/2022 Resolved Problems Electronic Signature(s) Signed: 11/08/2022 10:13:40 AM By: [name] MD FACS Entered By: [name] on 11/08/2022 10:13:40 -------------------------------------------------------------------------------- Progress Note Details Patient Name: Date of Service: [name], [facility] RO N 11/08/2022 9:15 A M Medical Record Number: [id number] Patient Account Number: [id number] Date of Birth/Sex: Treating RN: [birth year] (82 y.o. F) Primary Care Provider: [name], [facility] Other Clinician: Referring Provider: Treating Provider/Extender:  [name] SHO KES, [place] Weeks in Treatment: 21 Subjective Chief Complaint Information obtained from Patient Patient seen for complaints of Non-Healing Wound. History of Present Illness (HPI) ADMISSION 06/12/2022 This is an 82 year old woman with a history of CVA, crest syndrome, atrial fibrillation, rocker-bottom foot deformity. 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 She has been followed by podiatry for this and I am not entirely clear as to how she came to be referred here. She resides in an assisted living facility. It is not clear what they have been putting on her wound, but on intake, she was noted to have denuded skin on her medial third toe, as well as ulcers on her dorsal great toe and lateral great toe. There is slough accumulation on both of the toe ulcers. There was an odor noted at intake, but after her foot was washed, the odor dissipated. Her toes are folded on top of each other creating areas of abrasion and pockets for moisture collection,  which seems to be the primary cause of her ulceration. 06/20/2022: The skin between her toes and on the ball of her foot is completely macerated. There has been more tissue breakdown. The wound on her great toe has some slough accumulation. 06/27/2022: No change to her wounds today. There has been no further deterioration, but no significant improvement. She was both hypotensive and bradycardic on intake. 07/11/2022: Today, her foot is completely macerated. She reports that the wound care nurse actually soaked her foot and then applied foam, despite our specific orders to not use foam at all. She also is draining serous fluid from both legs and has 2+ pitting edema. She is on furosemide 20 mg twice a day. 07/19/2022: Once again, her foot is completely macerated. There has been further tissue breakdown to the second and third toes and she now has ulcers on the distal ends. The wounds on her medial and lateral great toe have thick slough accumulation. Apparently Xeroform was found between her toes on intake. Edema control is improved, but still not perfect. No overt drainage from her legs appreciated on exam today. 07/27/2022: She has less tissue maceration today. Edema control in her bilateral lower extremities is improved. Still with slough accumulation on all open wound surfaces. [name], [name] ([id number]) [id number] Page 11 of 17 08/08/2022: Significant improvement this week. She has very little tissue maceration and according to her aide, there has been very little drainage from her legs. There is some slough accumulation on the medial great toe ulcer. Edema control is improved bilaterally. She is spending more time in her bed with her legs elevated and less in her wheelchair with her legs in a dependent position. 08/20/2022: The edema in her legs is now well-controlled with the use of the zinc Unna boots. Unfortunately, this seems to have resulted in more drainage coming  from the open areas of her feet. They are a bit macerated but there has not been as much tissue breakdown secondary to moisture as we have seen on previous visits. 08/28/2022: The only remaining open wound in her foot is on the dorsal great toe. The improvement in the rest of the foot is quite dramatic, with no tissue maceration or breakdown. She has been elevating her legs and wearing compression wraps. Edema control is excellent and there has been no drainage from her legs. 09/05/2022: Unfortunately, the distal half of her dorsal foot has broken down and has a layer of slough on the  surface. This appears to be secondary to moisture accumulation. Her dressing was completely saturated. 09/20/2022: There has been further deterioration of her foot. The dressing that was applied was bizarre and involved Coflex foam wrapped around her foot to the ankle and then Kerlix and Coban over that to the knee. She fortunately did have silver alginate between her toes but the tissue breakdown from moisture is extensive. 09/25/2022: There has been massive improvement in her foot since last week. The maceration has decreased, edema control is markedly better, and the wounds are showing evidence of healing, as opposed to worsening. 10/03/2022: The wound on her great toe is much smaller with minimal slough accumulation. The dorsal foot is also improving. She has a new ulcer on the plantar surface of her left fourth toe, however, and bone is exposed. Edema control and tissue maceration continues to be significantly better now that we are doing all of the dressing care. 10/09/2022: Unfortunately, it seems that the patient has resumed her habit of sitting in her wheelchair with her legs in a dependent position and there has been a lot more drainage and maceration on her foot. The dorsal foot wounds have expanded and are deeper. She has a new wound on her second toe and although the initial wound on her great toe has healed, she  has a new wound on the lateral aspect of her great toe, immediately adjacent to that on her second toe, suggesting these have been caused by friction of the 2 toes rubbing against each other. Her son is participating in this visit via speaker phone. 10/24/2022: Last week she had a nurse visit due to clinic capacity and it appears that her dressing was done differently by the nurse that saw her then how it is usually performed by her regular nurse. Unfortunately, this meant there was more moisture-related tissue breakdown. The dorsum of her foot is open again and she has a new wound on the lateral aspect of her fifth toe. There is slough accumulation in each of the sites. 11/01/2022: There has been remarkable turnaround since her last visit. All of the wounds are smaller. The wound on the dorsal aspect of her great toe has closed completely. The wounds on the top of her foot have contracted and are starting to epithelialize. The new wound on the lateral aspect of her fifth toe does have some tendon exposure today that was not appreciated at her last visit. There is more tissue over the exposed bone on the plantar surface of her fourth toe, although bone does still remain exposed. 11/08/2022: All of her wounds are looking better again this week. The exposed bone on the plantar surface of the fourth toe has now been covered with soft tissue. The dorsal foot wounds are epithelializing nicely. The lateral fifth toe wound is smaller. There is slough on all of the surfaces. She had ABIs done in the vascular lab last week. Results are copied here: ABI Findings: +---------+------------------+-----+----------+--------+ Right Rt Pressure (mmHg)IndexWaveform Comment  +---------+------------------+-----+----------+--------+ Brachial 132     +---------+------------------+-----+----------+--------+ PTA 108 0.82 monophasic  +---------+------------------+-----+----------+--------+ DP 81 0.61  monophasic  +---------+------------------+-----+----------+--------+ Great T oe24 0.18 Abnormal   +---------+------------------+-----+----------+--------+ +---------+------------------+-----+----------+--------------------------------+ Left Lt Pressure (mmHg)IndexWaveform Comment  +---------+------------------+-----+----------+--------------------------------+ Brachial 121     +---------+------------------+-----+----------+--------------------------------+ PTA 88 0.67 monophasic  +---------+------------------+-----+----------+--------------------------------+ DP 111 0.84 monophasic  +---------+------------------+-----+----------+--------------------------------+ Great T   unable to obtain due to  oe     bandaging/ wound  +---------+------------------+-----+----------+--------------------------------+ +-------+-----------+-----------+------------+------------+ ABI/TBIT oday's ABIT oday's TBIPrevious ABIPrevious TBI +-------+-----------+-----------+------------+------------+ Right 0.82  0.18    +-------+-----------+-----------+------------+------------+ Left 0.84     +-------+-----------+-----------+------------+------------+ Summary: Right: Resting right ankle-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 The right toe-brachial index is abnormal. Left: Resting left ankle-brachial index indicates mild left lower extremity arterial disease. Unable to obtain TBI due to bandaging/wound. *See table(s) above for measurements and observations. Patient History Information obtained from Patient. [name], [name] ([id number]) [id number] Page 12 of 17 Family History Unknown History. Social History Former smoker - smoked when she was young, Marital Status - Widowed, Alcohol Use - Never, Drug Use - No History, Caffeine Use - Daily. Medical History Hematologic/Lymphatic Patient has  history of Anemia Cardiovascular Patient has history of Angina - a-fib, Hypertension, Vasculitis Endocrine Denies history of Type I Diabetes, Type II Diabetes Musculoskeletal Patient has history of Osteoarthritis Hospitalization/Surgery History - cholecystectomy. - leg surgery. - tonsillectomy. 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 no acute distress. Vitals Time Taken: 9:12 AM, Height: 67 in, Weight: 153 lbs, BMI: 24, Temperature: 97.7 F, Pulse: 111 bpm, Respiratory Rate: 18 breaths/min, Blood Pressure: 115/78 mmHg. Respiratory Normal work of breathing on room air. General Notes: 11/08/2022: All of her wounds are looking better again this week. The exposed bone on the plantar surface of the fourth toe has now been covered with soft tissue. The dorsal foot wounds are epithelializing nicely. The lateral fifth toe wound is smaller. There is slough on all of the surfaces. Integumentary (Hair, Skin) Wound #6 status is Open. Original cause of wound was Gradually Appeared. The date acquired was: 09/05/2022. The wound has been in treatment 9 weeks. The wound is located on the Left,Dorsal Foot. The wound measures 8cm length x 4cm width x 0.1cm depth; 25.133cm^2 area and 2.513cm^3 volume. There is Fat Layer (Subcutaneous Tissue) exposed. There is no tunneling or undermining noted. There is a medium amount of serosanguineous drainage noted. The wound margin is indistinct and nonvisible. There is small (1-33%) pink granulation within the wound bed. There is a large (67-100%) amount of necrotic tissue within the wound bed including Adherent Slough. The periwound skin appearance had no abnormalities noted for texture. The periwound skin appearance had no abnormalities noted for moisture. The periwound skin appearance exhibited: Rubor. Periwound  temperature was noted as Cool/Cold. The periwound has tenderness on palpation. Wound #7 status is Open. Original cause of wound was Gradually Appeared. The date acquired was: 10/03/2022. The wound has been in treatment 5 weeks. The wound is located on the Left,Plantar T Fourth. The wound measures 0.3cm length x 0.2cm width x 0.1cm depth; 0.047cm^2 area and 0.005cm^3 volume. oe There is Fat Layer (Subcutaneous Tissue) exposed. There is no tunneling or undermining noted. There is a medium amount of serous drainage noted. The wound margin is flat and intact. There is small (1-33%) pink granulation within the wound bed. There is a large (67-100%) amount of necrotic tissue within the wound bed including Eschar and Adherent Slough. The periwound skin appearance had no abnormalities noted for texture. The periwound skin appearance had no abnormalities noted for moisture. The periwound skin appearance exhibited: Rubor. Periwound temperature was noted as Cool/Cold. The periwound has tenderness on palpation. Wound #8 status is Open. Original cause of wound was Pressure Injury. The date acquired was: 10/24/2022. The wound has been in treatment 2 weeks. The wound is located on the Left,Lateral Foot. The wound measures 1cm length x 0.8cm width x 0.3cm depth; 0.628cm^2 area and 0.188cm^3  volume. There is Fat Layer (Subcutaneous Tissue) exposed. There is no tunneling or undermining noted. There is a medium amount of serosanguineous drainage noted. The wound margin is distinct with the outline attached to the wound base. There is small (1-33%) red granulation within the wound bed. There is a large (67-100%) amount of necrotic tissue within the wound bed including Adherent Slough. The periwound skin appearance had no abnormalities noted for texture. The periwound skin appearance had no abnormalities noted for color. The periwound skin appearance exhibited: Dry/Scaly. Periwound temperature was noted as No  Abnormality. The periwound has tenderness on palpation. Wound #9 status is Open. Original cause of wound was Gradually Appeared. The date acquired was: 11/01/2022. The wound has been in treatment 1 weeks. The wound is located on the [employer]. The wound measures 0.8cm length x 0.8cm width x 0.1cm depth; 0.503cm^2 area and 0.05cm^3 volume. There oe is no tunneling or undermining noted. There is a none present amount of drainage noted. The wound margin is flat and intact. There is no granulation within the wound bed. There is no necrotic tissue within the wound bed. The periwound skin appearance had no abnormalities noted for texture. The periwound skin appearance had no abnormalities noted for moisture. The periwound skin appearance exhibited: Rubor. Periwound temperature was noted as Cool/Cold. The periwound has tenderness on palpation. 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 There was a Selective/Open Wound Non-Viable Tissue Debridement with a total area of 10.05 sq cm performed by [name], MD. With the following instrument(s): Curette to remove Non-Viable tissue/material. Material removed includes [facility] after achieving pain control using Lidocaine 4% Topical Solution. No  specimens were taken. A time out was conducted at 09:43, prior to the start of the procedure. A Minimum amount of bleeding was controlled with Pressure. The procedure was tolerated well. Post Debridement Measurements: 8cm length x 4cm width x 0.1cm depth; 2.513cm^3 volume. Character of Wound/Ulcer Post Debridement is improved. Post procedure Diagnosis Wound #6: Same as Pre-Procedure General Notes: scribed for Dr. Lady Gary by [name], RN. Pre-procedure diagnosis of Wound #6 is a Lymphedema located on the Left,Dorsal Foot . There was a Double Layer Compression Therapy Procedure by [name], RN. Post procedure Diagnosis Wound #6: Same as Pre-Procedure Wound #7 Pre-procedure diagnosis of Wound #7 is an Atypical located on the Left,Plantar T Fourth . There was a Selective/Open Wound Non-Viable Tissue oe Debridement with a total area of 0.05 sq cm performed by [name], MD. With the following instrument(s): Curette to remove Non-Viable tissue/material. Material removed includes [facility] after achieving pain control using Lidocaine 4% Topical Solution. No specimens were taken. A time out was conducted at 09:43, prior to the start of the procedure. A Minimum amount of bleeding was controlled with Pressure. The procedure was tolerated well. Post Debridement Measurements: 0.3cm length x 0.2cm width x 0.1cm depth; 0.005cm^3 volume. Character of Wound/Ulcer Post Debridement is improved. Post procedure Diagnosis Wound #  7: Same as Pre-Procedure General Notes: scribed for Dr. Lady Gary by [name], RN. Wound #8 Pre-procedure diagnosis of Wound #8 is a Pressure Ulcer located on the Left,Lateral Foot . There was a Selective/Open Wound Non-Viable Tissue Debridement with a total area of 0.63 sq cm performed by [name], MD. With the following instrument(s): Curette to remove Non-Viable tissue/material. Material removed includes [facility] after achieving pain control using  Lidocaine 4% Topical Solution. No specimens were taken. A time out was conducted at 09:43, prior to the start of the procedure. A Minimum amount of bleeding was controlled with Pressure. The procedure was tolerated well. Post Debridement Measurements: 1cm length x 0.8cm width x 0.3cm depth; 0.188cm^3 volume. Post debridement Stage noted as Unstageable/Unclassified. Character of Wound/Ulcer Post Debridement is improved. Post procedure Diagnosis Wound #8: Same as Pre-Procedure General Notes: scribed for Dr. Lady Gary by [name], RN. 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 There was a Selective/Open Wound Non-Viable Tissue oe Debridement with a total area of 0.01 sq cm performed by [name], MD. With the following instrument(s): Curette to remove Non-Viable tissue/material. Material removed includes [facility] after achieving pain control using Lidocaine 4% Topical Solution. No specimens were taken. A time out was conducted at 09:43, prior to the start of the procedure. A Minimum amount of bleeding was controlled with Pressure. The procedure was tolerated well. Post Debridement Measurements: 0.1cm length x 0.1cm width x 0.1cm depth; 0.001cm^3 volume. Character of Wound/Ulcer Post Debridement is improved. Post procedure Diagnosis Wound #9: Same as Pre-Procedure General Notes: scribed for Dr. Lady Gary by [name], RN. Plan Follow-up Appointments: Return Appointment in 1 week. - Dr. Lady Gary - room 2 DO NOT CHANGE DRESSING - Anesthetic: (In clinic) Topical Lidocaine 4% applied to wound bed - USED in Clinic Bathing/ Shower/ Hygiene: May shower with protection but do not get wound dressing(s) wet. Protect dressing(s) with water repellant cover (for example, large plastic bag) or a cast cover and may then take shower. Edema Control - Lymphedema / SCD / Other: Elevate legs to the level of the heart or above for 30 minutes  daily and/or when sitting for 3-4 times a day throughout the day. Avoid standing for long periods of time. 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 Cleanser: Wound Cleanser 1 x Per Week/30 Days Discharge Instructions: Cleanse the wound with wound cleanser prior to applying a clean dressing using gauze sponges, not tissue or cotton balls. Peri-Wound Care: Sween Lotion (Moisturizing lotion) 1 x Per Week/30 Days Discharge Instructions: Apply moisturizing lotion as directed Topical: Gentamicin 1 x Per Week/30 Days Discharge Instructions: As directed by physician Topical: Mupirocin Ointment 1 x Per Week/30 Days Discharge Instructions: Apply Mupirocin (Bactroban) as instructed Topical: Ketoconazole Cream 2% 1 x Per Week/30 Days Discharge Instructions: Apply Ketoconazole as directed Topical: Triamcinolone 1 x Per Week/30 Days Discharge Instructions: Apply Triamcinolone as directed Topical: zinc 1 x Per Week/30 Days Prim Dressing: Maxorb Extra  Ag+ Alginate Dressing, 4x4.75 (in/in) 1 x Per Week/30 Days ary Discharge Instructions: Apply to wound bed as instructed Secondary Dressing: ABD Pad, 8x10 1 x Per Week/30 Days Discharge Instructions: Apply over primary dressing as directed. Secondary Dressing: Drawtex 4x4 in 1 x Per Week/30 Days Discharge Instructions: Apply over primary dressing as  directed. Secondary Dressing: Woven Gauze Sponge, Non-Sterile 4x4 in 1 x Per Week/30 Days Discharge Instructions: can use gauze between toes (if no desired) Secondary Dressing: Zetuvit Plus 4x8 in 1 x Per Week/30 Days Discharge Instructions: Apply over primary dressing as directed. Com pression Wrap: Urgo K2 Lite, (equivalent to a 3 layer) two layer compression system, regular 1 x Per Week/30 Days Discharge Instructions: Apply Urgo K2 Lite as directed (alternative to 3 layer compression). WOUND #7: - T Fourth Wound Laterality: Plantar, Left oe Cleanser: Soap and Water 1 x Per Week/30 Days Discharge Instructions: May shower and wash wound with dial antibacterial soap and water prior to dressing change. Cleanser: Wound Cleanser 1 x Per Week/30 Days Discharge Instructions: Cleanse the wound with wound cleanser prior to applying a clean dressing using gauze sponges, not tissue or cotton balls. Peri-Wound Care: Sween Lotion (Moisturizing lotion) 1 x Per Week/30 Days Discharge Instructions: Apply moisturizing lotion as directed Topical: Gentamicin 1 x Per Week/30 Days Discharge Instructions: As directed by physician Topical: Mupirocin Ointment 1 x Per Week/30 Days Discharge Instructions: Apply Mupirocin (Bactroban) as instructed Topical: Ketoconazole Cream 2% 1 x Per Week/30 Days Discharge Instructions: Apply Ketoconazole as directed Topical: Triamcinolone 1 x Per Week/30 Days Discharge Instructions: Apply Triamcinolone as directed Topical: zinc 1 x Per Week/30 Days Prim Dressing: Maxorb Extra Ag+ Alginate Dressing, 2x2 (in/in) 1 x Per Week/30 Days ary Discharge Instructions: Apply to wound bed as instructed Secondary Dressing: ABD Pad, 8x10 1 x Per Week/30 Days Discharge Instructions: Apply over primary dressing as directed. Secondary Dressing: Drawtex 4x4 in 1 x Per Week/30 Days Discharge Instructions: Apply over primary dressing as directed. Secondary Dressing: Woven Gauze Sponge,  Non-Sterile 4x4 in 1 x Per Week/30 Days Discharge Instructions: can use gauze between toes (if no desired) Secondary Dressing: Zetuvit Plus 4x8 in 1 x Per Week/30 Days Discharge Instructions: Apply over primary dressing as directed. Com pression Wrap: Urgo K2 Lite, (equivalent to a 3 layer) two layer compression system, regular 1 x Per Week/30 Days Discharge Instructions: Apply Urgo K2 Lite as directed (alternative to 3 layer compression). WOUND #8: - Foot Wound Laterality: Left, Lateral Cleanser: Soap and Water 1 x Per Week/30 Days Discharge Instructions: May shower and wash wound with dial antibacterial soap and water prior to dressing change. Cleanser: Wound Cleanser 1 x Per Week/30 Days Discharge Instructions: Cleanse the wound with wound cleanser prior to applying a clean dressing using gauze sponges, not tissue or cotton balls. Peri-Wound Care: Sween Lotion (Moisturizing lotion) 1 x Per Week/30 Days Discharge Instructions: Apply moisturizing lotion as directed Topical: Gentamicin 1 x Per Week/30 Days Discharge Instructions: As directed by physician Topical: Mupirocin Ointment 1 x Per Week/30 Days Discharge Instructions: Apply Mupirocin (Bactroban) as instructed Topical: Ketoconazole Cream 2% 1 x Per Week/30 Days Discharge Instructions: Apply Ketoconazole as directed Topical: Triamcinolone 1 x Per Week/30 Days Discharge Instructions: Apply Triamcinolone as directed Topical: zinc 1 x Per Week/30 Days Prim Dressing: Endoform 2x2 in 1 x Per Week/30 Days ary Discharge Instructions: Moisten with saline Secondary Dressing: ABD Pad, 8x10 1 x Per Week/30 Days Discharge Instructions: Apply over primary dressing as directed. Secondary Dressing: Drawtex 4x4 in 1  x Per Week/30 Days Discharge Instructions: Apply over primary dressing as directed. Secondary Dressing: Woven Gauze Sponge, Non-Sterile 4x4 in 1 x Per Week/30 Days Discharge Instructions: can use gauze between toes (if no  desired) Secondary Dressing: Zetuvit Plus 4x8 in 1 x Per Week/30 Days Discharge Instructions: Apply over primary dressing as directed. Com pression Wrap: Urgo K2 Lite, (equivalent to a 3 layer) two layer compression system, regular 1 x Per Week/30 Days Discharge Instructions: Apply Urgo K2 Lite as directed (alternative to 3 layer compression). WOUND #9: - T Great Wound Laterality: Left, Lateral oe Cleanser: Soap and Water 1 x Per Week/30 Days Discharge Instructions: May shower and wash wound with dial antibacterial soap and water prior to dressing change. Cleanser: Wound Cleanser 1 x Per Week/30 Days Discharge Instructions: Cleanse the wound with wound cleanser prior to applying a clean dressing using gauze sponges, not tissue or cotton balls. Peri-Wound Care: Sween Lotion (Moisturizing lotion) 1 x Per Week/30 Days Discharge Instructions: Apply moisturizing lotion as directed Topical: Gentamicin 1 x Per Week/30 Days [place], [name] ([id number]) [phone].pdf Page 15 of 17 Discharge Instructions: As directed by physician Topical: Mupirocin Ointment 1 x Per Week/30 Days Discharge Instructions: Apply Mupirocin (Bactroban) as instructed Topical: Ketoconazole Cream 2% 1 x Per Week/30 Days Discharge Instructions: Apply Ketoconazole as directed Topical: Triamcinolone 1 x Per Week/30 Days Discharge Instructions: Apply Triamcinolone as directed Topical: zinc 1 x Per Week/30 Days Prim Dressing: Maxorb Extra Ag+ Alginate Dressing, 2x2 (in/in) 1 x Per Week/30 Days ary Discharge Instructions: Apply to wound bed as instructed Secondary Dressing: ABD Pad, 8x10 1 x Per Week/30 Days Discharge Instructions: Apply over primary dressing as directed. Secondary Dressing: Drawtex 4x4 in 1 x Per Week/30 Days Discharge Instructions: Apply over primary dressing as directed. Secondary Dressing: Woven Gauze Sponge, Non-Sterile 4x4 in 1 x Per Week/30 Days Discharge Instructions: can use  gauze between toes (if no desired) Secondary Dressing: Zetuvit Plus 4x8 in 1 x Per Week/30 Days Discharge Instructions: Apply over primary dressing as directed. Com pression Wrap: Urgo K2 Lite, (equivalent to a 3 layer) two layer compression system, regular 1 x Per Week/30 Days Discharge Instructions: Apply Urgo K2 Lite as directed (alternative to 3 layer compression). 11/08/2022: All of her wounds are looking better again this week. The exposed bone on the plantar surface of the fourth toe has now been covered with soft tissue. The dorsal foot wounds are epithelializing nicely. The lateral fifth toe wound is smaller. There is slough on all of the surfaces. I used a curette to debride slough from the wound surfaces. We will continue to apply the topical mixture of gentamicin, mupirocin, and ketoconazole to her wounds. We will continue endoform to the lateral fifth toe wound. We will apply silver alginate to the other sites. Continue Urgo lite compression wrap. I am going to place a referral to vascular surgery regarding her diminished ABIs and nonhealing wounds. There still remains a possibility that she may require toe amputation and I would like her to be able to have adequate blood flow to heal an amputation site as well. Follow-up with me in a week. Electronic Signature(s) Signed: 11/08/2022 10:22:22 AM By: [name] MD FACS Entered By: [name] on 11/08/2022 10:22:22 -------------------------------------------------------------------------------- HxROS Details Patient Name: Date of Service: [place] [name], [facility] RO N 11/08/2022 9:15 A M Medical Record Number: [id number] Patient Account Number: [id number] Date of Birth/Sex: Treating RN: 1941-03-23 (82 y.o. 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 Endocrine Medical History: Negative for: Type I Diabetes; Type II Diabetes Past Medical History Notes: hypothyroidism Musculoskeletal Medical History: Positive for: Osteoarthritis Past Medical History NotesANJELI, CASAD ([id number]) 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 Neurologic Medical History: Past Medical History Notes: stroke Immunizations Pneumococcal Vaccine: Received Pneumococcal Vaccination: Yes Received Pneumococcal Vaccination On or After 60th Birthday: Yes Implantable Devices None Hospitalization / Surgery History Type of Hospitalization/Surgery cholecystectomy leg surgery tonsillectomy Family and Social History Unknown History: Yes; Former smoker - smoked when she was young; Marital Status - Widowed; Alcohol Use: Never; Drug Use: No History; Caffeine Use: Daily; Financial Concerns: No; Food, Clothing or Shelter Needs: No; Support System Lacking: No; Transportation Concerns: No Electronic Signature(s) Signed: 11/08/2022 4:23:46 PM By: [name] MD FACS Entered By: [name] on 11/08/2022 10:19:05 -------------------------------------------------------------------------------- SuperBill Details Patient Name: Date of Service: [name], [facility] RO N 11/08/2022 Medical Record Number: [id number] Patient Account Number: [id number] Date of Birth/Sex: Treating RN: [birth year] (82 y.o. 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 I10 Essential (primary) hypertension 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 atrial fibrillation Z79.01 Long term (current) use of anticoagulants I89.0 Lymphedema, not elsewhere classified Facility Procedures Physician Procedures : CPT4 Code Description Modifier [id number] - WC PHYS LEVEL 4 - EST PT 25 ICD-10 Diagnosis Description L97.522 Non-pressure chronic ulcer of other part of left foot with fat layer exposed L97.526 Non-pressure chronic ulcer of other part of left foot  with bone involvement without evidence of necr L97.525 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 without evidence of ne 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

## 2022-11-16 ENCOUNTER — Encounter (HOSPITAL_BASED_OUTPATIENT_CLINIC_OR_DEPARTMENT_OTHER): Payer: Medicare Other | Admitting: General Surgery

## 2022-11-16 DIAGNOSIS — L97522 Non-pressure chronic ulcer of other part of left foot with fat layer exposed: Secondary | ICD-10-CM | POA: Diagnosis not present

## 2022-11-16 NOTE — Progress Notes (Signed)
DASHLEY, ROGILLIO ([id number]) [id number] Page 1 of 17 Visit Report for 11/16/2022 Chief Complaint Document Details Patient Name: Date of Service: [name] 11/16/2022 7:45 A M Medical Record Number: [id number] Patient Account Number: [id number] Date of Birth/Sex: Treating RN: 10-16-1940 (82 y.o. F) Primary Care Provider: [name], [facility] Other Clinician: Referring Provider: Treating Provider/Extender: [name], [facility] Weeks in Treatment: 22 Information Obtained from: Patient Chief Complaint Patient seen for complaints of Non-Healing Wound. Electronic Signature(s) Signed: 11/16/2022 8:27:08 AM By: [name] MD FACS Entered By: [name] on 11/16/2022 08:27:08 -------------------------------------------------------------------------------- Debridement Details Patient Name: Date of Service: [name], [name] 11/16/2022 7:45 A M Medical Record Number: [id number] Patient Account Number: [id number] Date of Birth/Sex: Treating RN: 1941-01-05 (82 y.o. [name] Primary Care Provider: [name], [facility] Other Clinician: Referring Provider: Treating Provider/Extender: [name], [facility] Weeks in Treatment: 22 Debridement Performed for Assessment: Wound #7 Left,Plantar T Fourth oe Performed By: Physician [name], MD Debridement Type: Debridement Level of Consciousness (Pre-procedure): Awake and Alert Pre-procedure Verification/Time Out Yes - 08:16 Taken: Start Time: 08:16 Pain Control: Lidocaine 4% Topical Solution Percent of Wound Bed Debrided: 100% T Area Debrided (cm): otal 0.09 Tissue and other material debrided: Non-Viable, Eschar Level: Non-Viable Tissue Debridement Description: Selective/Open Wound Instrument: Curette Bleeding: Minimum Hemostasis Achieved: Pressure Response to Treatment: Procedure was tolerated well Level of Consciousness (Post- Awake and Alert procedure): Post  Debridement Measurements of Total Wound Length: (cm) 0.3 Width: (cm) 0.4 Depth: (cm) 0.1 Volume: (cm) 0.009 Character of Wound/Ulcer Post Debridement: Improved Post Procedure Diagnosis Same as Pre-procedure [name], [name] ([id number]) [id number] Page 2 of 17 Notes scribed for Dr. Lady Gary by [name], RN Electronic Signature(s) Signed: 11/16/2022 9:17:42 AM By: [name] MD FACS Signed: 11/16/2022 11:40:21 AM By: Gelene Mink By: [name] on 11/16/2022 08:17:08 -------------------------------------------------------------------------------- Debridement Details Patient Name: Date of Service: [name], [name] 11/16/2022 7:45 A M Medical Record Number: [id number] Patient Account Number: [id number] Date of Birth/Sex: Treating RN: 01-09-1941 (82 y.o. [name] Primary Care Provider: [name], [facility] Other Clinician: Referring Provider: Treating Provider/Extender: [name], [facility] Weeks in Treatment: 22 Debridement Performed for Assessment: Wound #9 Left,Lateral T Great oe Performed By: Physician [name], MD Debridement Type: Debridement Level of Consciousness (Pre-procedure): Awake and Alert Pre-procedure Verification/Time Out Yes - 08:16 Taken: Start Time: 08:16 Pain Control: Lidocaine 4% T opical Solution Percent of Wound Bed Debrided: 100% T Area Debrided (cm): otal 0.04 Tissue and other material debrided: Non-Viable, Slough, Slough Level: Non-Viable Tissue Debridement Description: Selective/Open Wound Instrument: Curette Bleeding: Minimum Hemostasis Achieved: Pressure Response to Treatment: Procedure was tolerated well Level of Consciousness (Post- Awake and Alert procedure): Post Debridement Measurements of Total Wound Length: (cm) 0.5 Width: (cm) 0.1 Depth: (cm) 0.1 Volume: (cm) 0.004 Character of Wound/Ulcer Post Debridement: Improved Post Procedure  Diagnosis Same as Pre-procedure Notes scribed for Dr. Lady Gary by [name], RN Electronic Signature(s) Signed: 11/16/2022 9:17:42 AM By: [name] MD FACS Signed: 11/16/2022 11:40:21 AM By: [name] Entered By: [name] on 11/16/2022 08:17:30 -------------------------------------------------------------------------------- Debridement Details Patient Name: Date of Service: [name], [name] 11/16/2022 7:45 A M Medical Record Number: [id number] Patient Account Number: [id number] [name], [name] ([id number]) [id number] Page 3 of 17 Date of Birth/Sex: Treating RN: 08/09/1940 (82 y.o. [name] Primary Care Provider: [name], [facility] Other Clinician: Referring Provider: Treating Provider/Extender: [name] SHO KES, [facility] Weeks in Treatment: 22 Debridement Performed  for Assessment: Wound #6 Left,Dorsal Foot Performed By: Physician [name], MD Debridement Type: Debridement Level of Consciousness (Pre-procedure): Awake and Alert Pre-procedure Verification/Time Out Yes - 08:16 Taken: Start Time: 08:16 Pain Control: Lidocaine 4% T opical Solution Percent of Wound Bed Debrided: 75% T Area Debrided (cm): otal 11.3 Tissue and other material debrided: Non-Viable, Slough, Slough Level: Non-Viable Tissue Debridement Description: Selective/Open Wound Instrument: Curette Bleeding: Minimum Hemostasis Achieved: Pressure Response to Treatment: Procedure was tolerated well Level of Consciousness (Post- Awake and Alert procedure): Post Debridement Measurements of Total Wound Length: (cm) 6 Width: (cm) 3.2 Depth: (cm) 0.1 Volume: (cm) 1.508 Character of Wound/Ulcer Post Debridement: Improved Post Procedure Diagnosis Same as Pre-procedure Notes scribed for Dr. Lady Gary by [name], RN Electronic Signature(s) Signed: 11/16/2022 9:17:42 AM By: [name] MD FACS Signed: 11/16/2022 11:40:21 AM  By: [name] Entered By: [name] on 11/16/2022 08:19:11 -------------------------------------------------------------------------------- Debridement Details Patient Name: Date of Service: [name], [facility] [name] 11/16/2022 7:45 A M Medical Record Number: [id number] Patient Account Number: [id number] Date of Birth/Sex: Treating RN: 1940-12-16 (82 y.o. [name] Primary Care Provider: [name], [facility] Other Clinician: Referring Provider: Treating Provider/Extender: [name], [facility] Weeks in Treatment: 22 Debridement Performed for Assessment: Wound #8 Left,Lateral Foot Performed By: Physician [name], MD Debridement Type: Debridement Level of Consciousness (Pre-procedure): Awake and Alert Pre-procedure Verification/Time Out Yes - 08:16 Taken: Start Time: 08:16 Pain Control: Lidocaine 4% Topical Solution Percent of Wound Bed Debrided: 100% T Area Debrided (cm): otal 0.55 Tissue and other material debrided: Non-Viable, Muscle, Slough, Slough Level: Skin/Subcutaneous Tissue/Muscle Debridement Description: Excisional Instrument: Curette Bleeding: Minimum Hemostasis Achieved: Pressure Response to Treatment: Procedure was tolerated well Level of Consciousness [name], [name] ([id number]) [id number] Page 4 of 17 Level of Consciousness (Post- Awake and Alert procedure): Post Debridement Measurements of Total Wound Length: (cm) 1 Stage: Unstageable/Unclassified Width: (cm) 0.7 Depth: (cm) 0.3 Volume: (cm) 0.165 Character of Wound/Ulcer Post Debridement: Improved Post Procedure Diagnosis Same as Pre-procedure Notes scribed for Dr. Lady Gary by [name], RN Electronic Signature(s) Signed: 11/16/2022 9:17:42 AM By: [name] MD FACS Signed: 11/16/2022 11:40:21 AM By: [name] Entered By: [name] on 11/16/2022  08:20:22 -------------------------------------------------------------------------------- HPI Details Patient Name: Date of Service: [name], [facility] [name] 11/16/2022 7:45 A M Medical Record Number: [id number] Patient Account Number: [id number] Date of Birth/Sex: Treating RN: 28-Jul-1940 (82 y.o. F) Primary Care Provider: [name], [facility] Other Clinician: Referring Provider: Treating Provider/Extender: [name], [facility] Weeks in Treatment: 22 History of Present Illness HPI Description: ADMISSION 06/12/2022 This is an 82 year old woman with a history of CVA, crest syndrome, atrial fibrillation, rocker-bottom foot deformity. 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 She has been followed by podiatry for this and I am not entirely clear as to how she came to be referred here. She resides in an assisted living facility. It is not clear what they have been putting on her wound, but on intake, she was noted to have denuded skin on her medial third toe, as well as ulcers on her dorsal great toe and lateral great toe. There is slough accumulation on both of the toe ulcers. There was an odor noted at intake, but after her foot was washed, the odor dissipated. Her toes are folded on top of each other creating areas of abrasion and pockets for moisture collection, which seems to be the primary cause of her ulceration. 06/20/2022: The skin  between her toes and on the ball of her foot is completely macerated. There has been more tissue breakdown. The wound on her great toe has some slough accumulation. 06/27/2022: No change to her wounds today. There has been no further deterioration, but no significant improvement. She was both hypotensive and bradycardic on intake. 07/11/2022: Today, her foot is completely macerated. She reports that the wound care nurse actually soaked her foot and then applied foam, despite our specific orders to not use foam at all.  She also is draining serous fluid from both legs and has 2+ pitting edema. She is on furosemide 20 mg twice a day. 07/19/2022: Once again, her foot is completely macerated. There has been further tissue breakdown to the second and third toes and she now has ulcers on the distal ends. The wounds on her medial and lateral great toe have thick slough accumulation. Apparently Xeroform was found between her toes on intake. Edema control is improved, but still not perfect. No overt drainage from her legs appreciated on exam today. 07/27/2022: She has less tissue maceration today. Edema control in her bilateral lower extremities is improved. Still with slough accumulation on all open wound surfaces. 08/08/2022: Significant improvement this week. She has very little tissue maceration and according to her aide, there has been very little drainage from her legs. There is some slough accumulation on the medial great toe ulcer. Edema control is improved bilaterally. She is spending more time in her bed with her legs elevated and less in her wheelchair with her legs in a dependent position. 08/20/2022: The edema in her legs is now well-controlled with the use of the zinc Unna boots. Unfortunately, this seems to have resulted in more drainage coming from the open areas of her feet. They are a bit macerated but there has not been as much tissue breakdown secondary to moisture as we have seen on previous visits. 08/28/2022: The only remaining open wound in her foot is on the dorsal great toe. The improvement in the rest of the foot is quite dramatic, with no tissue maceration or breakdown. She has been elevating her legs and wearing compression wraps. Edema control is excellent and there has been no drainage from her legs. 09/05/2022: Unfortunately, the distal half of her dorsal foot has broken down and has a layer of slough on the surface. This appears to be secondary to moisture accumulation. Her dressing was completely  saturated. 09/20/2022: There has been further deterioration of her foot. The dressing that was applied was bizarre and involved Coflex foam wrapped around her foot to the Franklin, Janeece ([id number]) [id number] Page 5 of 17 ankle and then Kerlix and Coban over that to the knee. She fortunately did have silver alginate between her toes but the tissue breakdown from moisture is extensive. 09/25/2022: There has been massive improvement in her foot since last week. The maceration has decreased, edema control is markedly better, and the wounds are showing evidence of healing, as opposed to worsening. 10/03/2022: The wound on her great toe is much smaller with minimal slough accumulation. The dorsal foot is also improving. She has a new ulcer on the plantar surface of her left fourth toe, however, and bone is exposed. Edema control and tissue maceration continues to be significantly better now that we are doing all of the dressing care. 10/09/2022: Unfortunately, it seems that the patient has resumed her habit of sitting in her wheelchair with her legs in a dependent position and there has been a lot  more drainage and maceration on her foot. The dorsal foot wounds have expanded and are deeper. She has a new wound on her second toe and although the initial wound on her great toe has healed, she has a new wound on the lateral aspect of her great toe, immediately adjacent to that on her second toe, suggesting these have been caused by friction of the 2 toes rubbing against each other. Her son is participating in this visit via speaker phone. 10/24/2022: Last week she had a nurse visit due to clinic capacity and it appears that her dressing was done differently by the nurse that saw her then how it is usually performed by her regular nurse. Unfortunately, this meant there was more moisture-related tissue breakdown. The dorsum of her foot is open again and she has a new wound on the  lateral aspect of her fifth toe. There is slough accumulation in each of the sites. 11/01/2022: There has been remarkable turnaround since her last visit. All of the wounds are smaller. The wound on the dorsal aspect of her great toe has closed completely. The wounds on the top of her foot have contracted and are starting to epithelialize. The new wound on the lateral aspect of her fifth toe does have some tendon exposure today that was not appreciated at her last visit. There is more tissue over the exposed bone on the plantar surface of her fourth toe, although bone does still remain exposed. 11/08/2022: All of her wounds are looking better again this week. The exposed bone on the plantar surface of the fourth toe has now been covered with soft tissue. The dorsal foot wounds are epithelializing nicely. The lateral fifth toe wound is smaller. There is slough on all of the surfaces. She had ABIs done in the vascular lab last week. Results are copied here: ABI Findings: +---------+------------------+-----+----------+--------+ Right Rt Pressure (mmHg)IndexWaveform Comment  +---------+------------------+-----+----------+--------+ Brachial 132     +---------+------------------+-----+----------+--------+ PTA 108 0.82 monophasic  +---------+------------------+-----+----------+--------+ DP 81 0.61 monophasic  +---------+------------------+-----+----------+--------+ Great T oe24 0.18 Abnormal   +---------+------------------+-----+----------+--------+ +---------+------------------+-----+----------+--------------------------------+ Left Lt Pressure (mmHg)IndexWaveform Comment  +---------+------------------+-----+----------+--------------------------------+ Brachial 121     +---------+------------------+-----+----------+--------------------------------+ PTA 88 0.67 monophasic   +---------+------------------+-----+----------+--------------------------------+ DP 111 0.84 monophasic  +---------+------------------+-----+----------+--------------------------------+ Great T   unable to obtain due to  oe     bandaging/ wound  +---------+------------------+-----+----------+--------------------------------+ +-------+-----------+-----------+------------+------------+ ABI/TBIT oday's ABIT oday's TBIPrevious ABIPrevious TBI +-------+-----------+-----------+------------+------------+ Right 0.82 0.18    +-------+-----------+-----------+------------+------------+ Left 0.84     +-------+-----------+-----------+------------+------------+ Summary: Right: Resting right ankle-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 The right toe-brachial index is abnormal. Left: Resting left ankle-brachial index indicates mild left lower extremity arterial disease. Unable to obtain TBI due to bandaging/wound. *See table(s) above for measurements and observations. 11/16/2022: All of her wounds are improving. The wound on the plantar surface of the fourth toe has good tissue covering the bone. The dorsal foot areas are epithelializing. The lateral foot ulcer still has muscle exposed; this is probably the most severe of her wounds at this time. She is scheduled to see vascular surgery on August 20. Electronic Signature(s) Signed: 11/16/2022 8:28:21 AM By: [name] MD FACS Entered By: [name] on 11/16/2022 16:10:96 Marsh Dolly ([id number]) [id number] Page 6 of 17 -------------------------------------------------------------------------------- Physical Exam Details Patient Name: Date of Service: [name] 11/16/2022 7:45 A M Medical Record Number: [id number] Patient Account Number: [id number] Date of Birth/Sex: Treating RN: 08/11/1940 (82 y.o. F) Primary Care Provider: [name], [facility] Other Clinician: Referring Provider: Treating  Provider/Extender: [name] SHO KES, WENDY Weeks in Treatment: 22 Constitutional .Tachycardic, asymptomatic. . . no acute distress. Respiratory Normal work of breathing on room air. Notes 11/16/2022: All of her wounds are improving. The wound on the plantar surface of the fourth toe has good tissue covering the bone. The dorsal foot areas are epithelializing. The lateral foot ulcer still has muscle exposed; this is probably the most severe of her wounds at this time. Electronic Signature(s) Signed: 11/16/2022 8:29:13 AM By: [name] MD FACS Entered By: [name] on 11/16/2022 08:29:13 -------------------------------------------------------------------------------- Physician Orders Details Patient Name: Date of Service: [name], [facility] [name] 11/16/2022 7:45 A M Medical Record Number: [id number] Patient Account Number: [id number] Date of Birth/Sex: Treating RN: Mar 25, 1941 (82 y.o. 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 - Dr. Lady Gary - room 2 DO NOT CHANGE DRESSING - Return  A Anesthetic (In clinic) Topical Lidocaine 4% applied to wound bed - USED in Clinic Bathing/ Shower/ Hygiene May shower with protection but do not get wound dressing(s) wet. Protect dressing(s) with water repellant cover (for example, large plastic bag) or a cast cover and may then take shower. Edema Control - Lymphedema / SCD / Other Elevate legs to the level of the heart or above for 30 minutes daily and/or when sitting for 3-4 times a day throughout the day. Avoid standing for long periods of time. Non Wound Condition Right Lower Extremity Other Non Wound Condition Orders/Instructions: - urgo lite or 3 layer compression wrap on right and left lower leg [name], [name] ([id number]) [id number] Page 7 of 17 Wound Treatment Wound #6 - Foot Wound Laterality: Dorsal, Left Cleanser: Soap and Water 1 x Per Week/30 Days Discharge Instructions: May shower and wash wound with dial antibacterial soap and water prior to dressing change. Cleanser: Wound Cleanser 1 x Per Week/30 Days Discharge Instructions: Cleanse the wound with wound cleanser prior to applying a clean dressing using gauze sponges, not tissue or cotton balls. Peri-Wound Care: Sween Lotion (Moisturizing lotion) 1 x Per Week/30 Days Discharge Instructions: Apply moisturizing lotion as directed Topical: Gentamicin 1 x Per Week/30 Days Discharge Instructions: As directed by physician Topical: Mupirocin Ointment 1 x Per Week/30 Days Discharge Instructions: Apply Mupirocin (Bactroban) as instructed Topical: Ketoconazole Cream 2% 1 x Per Week/30 Days Discharge Instructions: Apply Ketoconazole as directed Topical: Triamcinolone 1 x Per Week/30 Days Discharge Instructions: Apply Triamcinolone as directed Topical: zinc 1 x Per Week/30 Days Prim Dressing: Maxorb Extra Ag+ Alginate Dressing, 4x4.75 (in/in) 1 x Per Week/30 Days ary Discharge Instructions: Apply to wound bed as instructed Secondary Dressing: ABD  Pad, 8x10 1 x Per Week/30 Days Discharge Instructions: Apply over primary dressing as directed. Secondary Dressing: Drawtex 4x4 in 1 x Per Week/30 Days Discharge Instructions: Apply over primary dressing as directed. Secondary Dressing: Woven Gauze Sponge, Non-Sterile  4x4 in 1 x Per Week/30 Days Discharge Instructions: can use gauze between toes (if no desired) Secondary Dressing: Zetuvit Plus 4x8 in 1 x Per Week/30 Days Discharge Instructions: Apply over primary dressing as directed. Compression Wrap: Urgo K2 Lite, (equivalent to a 3 layer) two layer compression system, regular 1 x Per Week/30 Days Discharge Instructions: Apply Urgo K2 Lite as directed (alternative to 3 layer compression). Wound #7 - T Fourth oe Wound Laterality: Plantar, Left Cleanser: Soap and Water 1 x Per Week/30 Days Discharge Instructions: May shower and wash wound with dial antibacterial soap and water prior to dressing change. Cleanser: Wound Cleanser 1 x Per Week/30 Days Discharge Instructions: Cleanse the wound with wound cleanser prior to applying a clean dressing using gauze sponges, not tissue or cotton balls. Peri-Wound Care: Sween Lotion (Moisturizing lotion) 1 x Per Week/30 Days Discharge Instructions: Apply moisturizing lotion as directed Topical: Gentamicin 1 x Per Week/30 Days Discharge Instructions: As directed by physician Topical: Mupirocin Ointment 1 x Per Week/30 Days Discharge Instructions: Apply Mupirocin (Bactroban) as instructed Topical: Ketoconazole Cream 2% 1 x Per Week/30 Days Discharge Instructions: Apply Ketoconazole as directed Topical: Triamcinolone 1 x Per Week/30 Days Discharge Instructions: Apply Triamcinolone as directed Topical: zinc 1 x Per Week/30 Days Prim Dressing: Maxorb Extra Ag+ Alginate Dressing, 2x2 (in/in) 1 x Per Week/30 Days ary Discharge Instructions: Apply to wound bed as instructed Secondary Dressing: ABD Pad, 8x10 1 x Per Week/30 Days Discharge Instructions:  Apply over primary dressing as directed. Secondary Dressing: Drawtex 4x4 in 1 x Per Week/30 Days Discharge Instructions: Apply over primary dressing as directed. Secondary Dressing: Woven Gauze Sponge, Non-Sterile 4x4 in 1 x Per Week/30 Days Awad, Danee ([id number]) [phone].pdf Page 8 of 17 Discharge Instructions: can use gauze between toes (if no desired) Secondary Dressing: Zetuvit Plus 4x8 in 1 x Per Week/30 Days Discharge Instructions: Apply over primary dressing as directed. Compression Wrap: Urgo K2 Lite, (equivalent to a 3 layer) two layer compression system, regular 1 x Per Week/30 Days Discharge Instructions: Apply Urgo K2 Lite as directed (alternative to 3 layer compression). Wound #8 - Foot Wound Laterality: Left, Lateral Cleanser: Soap and Water 1 x Per Week/30 Days Discharge Instructions: May shower and wash wound with dial antibacterial soap and water prior to dressing change. Cleanser: Wound Cleanser 1 x Per Week/30 Days Discharge Instructions: Cleanse the wound with wound cleanser prior to applying a clean dressing using gauze sponges, not tissue or cotton balls. Peri-Wound Care: Sween Lotion (Moisturizing lotion) 1 x Per Week/30 Days Discharge Instructions: Apply moisturizing lotion as directed Topical: Gentamicin 1 x Per Week/30 Days Discharge Instructions: As directed by physician Topical: Mupirocin Ointment 1 x Per Week/30 Days Discharge Instructions: Apply Mupirocin (Bactroban) as instructed Topical: Ketoconazole Cream 2% 1 x Per Week/30 Days Discharge Instructions: Apply Ketoconazole as directed Topical: Triamcinolone 1 x Per Week/30 Days Discharge Instructions: Apply Triamcinolone as directed Topical: zinc 1 x Per Week/30 Days Prim Dressing: Endoform 2x2 in 1 x Per Week/30 Days ary Discharge Instructions: Moisten with saline Secondary Dressing: ABD Pad, 8x10 1 x Per Week/30 Days Discharge Instructions: Apply over primary dressing as  directed. Secondary Dressing: Drawtex 4x4 in 1 x Per Week/30 Days Discharge Instructions: Apply over primary dressing as directed. Secondary Dressing: Woven Gauze Sponge, Non-Sterile 4x4 in 1 x Per Week/30 Days Discharge Instructions: can use gauze between toes (if no desired) Secondary Dressing: Zetuvit Plus 4x8 in 1 x Per Week/30 Days Discharge Instructions: Apply over primary dressing as directed. Compression  Wrap: Urgo K2 Lite, (equivalent to a 3 layer) two layer compression system, regular 1 x Per Week/30 Days Discharge Instructions: Apply Urgo K2 Lite as directed (alternative to 3 layer compression). Wound #9 - T Great oe Wound Laterality: Left, Lateral Cleanser: Soap and Water 1 x Per Week/30 Days Discharge Instructions: May shower and wash wound with dial antibacterial soap and water prior to dressing change. Cleanser: Wound Cleanser 1 x Per Week/30 Days Discharge Instructions: Cleanse the wound with wound cleanser prior to applying a clean dressing using gauze sponges, not tissue or cotton balls. Peri-Wound Care: Sween Lotion (Moisturizing lotion) 1 x Per Week/30 Days Discharge Instructions: Apply moisturizing lotion as directed Topical: Gentamicin 1 x Per Week/30 Days Discharge Instructions: As directed by physician Topical: Mupirocin Ointment 1 x Per Week/30 Days Discharge Instructions: Apply Mupirocin (Bactroban) as instructed Topical: Ketoconazole Cream 2% 1 x Per Week/30 Days Discharge Instructions: Apply Ketoconazole as directed Topical: Triamcinolone 1 x Per Week/30 Days Discharge Instructions: Apply Triamcinolone as directed Topical: zinc 1 x Per Week/30 Days Prim Dressing: Maxorb Extra Ag+ Alginate Dressing, 2x2 (in/in) 1 x Per Week/30 Days ary Discharge Instructions: Apply to wound bed as instructed Mitzel, Tamiyah ([id number]) [id number] Page 9 of 17 Secondary Dressing: ABD Pad, 8x10 1 x Per Week/30 Days Discharge Instructions: Apply  over primary dressing as directed. Secondary Dressing: Drawtex 4x4 in 1 x Per Week/30 Days Discharge Instructions: Apply over primary dressing as directed. Secondary Dressing: Woven Gauze Sponge, Non-Sterile 4x4 in 1 x Per Week/30 Days Discharge Instructions: can use gauze between toes (if no desired) Secondary Dressing: Zetuvit Plus 4x8 in 1 x Per Week/30 Days Discharge Instructions: Apply over primary dressing as directed. Compression Wrap: Urgo K2 Lite, (equivalent to a 3 layer) two layer compression system, regular 1 x Per Week/30 Days Discharge Instructions: Apply Urgo K2 Lite as directed (alternative to 3 layer compression). Patient Medications llergies: Sulfa (Sulfonamide Antibiotics) A Notifications Medication Indication Start End 11/16/2022 lidocaine DOSE topical 4 % cream - cream topical Electronic Signature(s) Signed: 11/16/2022 9:17:42 AM By: [name] MD FACS Entered By: [name] on 11/16/2022 08:29:35 -------------------------------------------------------------------------------- Problem List Details Patient Name: Date of Service: [name], [facility] [name] 11/16/2022 7:45 A M Medical Record Number: [id number] Patient Account Number: [id number] Date of Birth/Sex: Treating RN: 10-11-1940 (82 y.o. 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 11/01/2022 No Yes without evidence of necrosis 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 06/12/2022 No Yes I10 Essential (primary)  hypertension 06/12/2022 No Yes 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 06/12/2022 No Yes [name], [name] ([id number]) [id number] Page 10 of 17 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 atrial fibrillation 06/12/2022 No Yes Z79.01 Long term (current) use of anticoagulants 06/12/2022 No Yes I89.0 Lymphedema, not elsewhere classified 08/20/2022 No Yes Inactive Problems ICD-10 Code Description Active Date Inactive Date L97.521 Non-pressure chronic ulcer of other part of left foot limited to breakdown of skin 06/12/2022 06/12/2022 Resolved Problems Electronic Signature(s) Signed: 11/16/2022 8:26:48 AM By: [name] MD FACS Entered By: [name] on 11/16/2022 08:26:48 -------------------------------------------------------------------------------- Progress Note Details Patient Name: Date  of Service: [name] 11/16/2022 7:45 A M Medical Record Number: [id number] Patient Account Number: [id number] Date of Birth/Sex: Treating RN: 04-10-1941 (82 y.o. F) Primary Care Provider: [name], [facility] Other Clinician: Referring Provider: Treating Provider/Extender: [name], [facility] Weeks in Treatment: 22 Subjective Chief Complaint Information obtained from Patient Patient seen for complaints of Non-Healing Wound. History of Present Illness (HPI) ADMISSION 06/12/2022 This is an 82 year old woman with a history of CVA, crest syndrome, atrial fibrillation, rocker-bottom foot deformity. 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 She has been followed by podiatry for this and I am not entirely clear as to how she came to be referred here. She resides in an assisted living facility. It is not clear what they have been putting on her wound, but on intake, she was noted to have denuded skin on her medial third toe, as well as ulcers on her dorsal great toe and lateral great toe. There is  slough accumulation on both of the toe ulcers. There was an odor noted at intake, but after her foot was washed, the odor dissipated. Her toes are folded on top of each other creating areas of abrasion and pockets for moisture collection, which seems to be the primary cause of her ulceration. 06/20/2022: The skin between her toes and on the ball of her foot is completely macerated. There has been more tissue breakdown. The wound on her great toe has some slough accumulation. 06/27/2022: No change to her wounds today. There has been no further deterioration, but no significant improvement. She was both hypotensive and bradycardic on intake. 07/11/2022: Today, her foot is completely macerated. She reports that the wound care nurse actually soaked her foot and then applied foam, despite our specific orders to not use foam at all. She also is draining serous fluid from both legs and has 2+ pitting edema. She is on furosemide 20 mg twice a day. 07/19/2022: Once again, her foot is completely macerated. There has been further tissue breakdown to the second and third toes and she now has ulcers on the distal ends. The wounds on her medial and lateral great toe have thick slough accumulation. Apparently Xeroform was found between her toes on intake. Edema control is improved, but still not perfect. No overt drainage from her legs appreciated on exam today. 07/27/2022: She has less tissue maceration today. Edema control in her bilateral lower extremities is improved. Still with slough accumulation on all open wound surfaces. 08/08/2022: Significant improvement this week. She has very little tissue maceration and according to her aide, there has been very little drainage from her [name], [name] ([id number]) [id number] Page 11 of 17 legs. There is some slough accumulation on the medial great toe ulcer. Edema control is improved bilaterally. She is spending more time in her bed with her  legs elevated and less in her wheelchair with her legs in a dependent position. 08/20/2022: The edema in her legs is now well-controlled with the use of the zinc Unna boots. Unfortunately, this seems to have resulted in more drainage coming from the open areas of her feet. They are a bit macerated but there has not been as much tissue breakdown secondary to moisture as we have seen on previous visits. 08/28/2022: The only remaining open wound in her foot is on the dorsal great toe. The improvement in the rest of the foot is quite dramatic, with no tissue maceration or breakdown. She has been  elevating her legs and wearing compression wraps. Edema control is excellent and there has been no drainage from her legs. 09/05/2022: Unfortunately, the distal half of her dorsal foot has broken down and has a layer of slough on the surface. This appears to be secondary to moisture accumulation. Her dressing was completely saturated. 09/20/2022: There has been further deterioration of her foot. The dressing that was applied was bizarre and involved Coflex foam wrapped around her foot to the ankle and then Kerlix and Coban over that to the knee. She fortunately did have silver alginate between her toes but the tissue breakdown from moisture is extensive. 09/25/2022: There has been massive improvement in her foot since last week. The maceration has decreased, edema control is markedly better, and the wounds are showing evidence of healing, as opposed to worsening. 10/03/2022: The wound on her great toe is much smaller with minimal slough accumulation. The dorsal foot is also improving. She has a new ulcer on the plantar surface of her left fourth toe, however, and bone is exposed. Edema control and tissue maceration continues to be significantly better now that we are doing all of the dressing care. 10/09/2022: Unfortunately, it seems that the patient has resumed her habit of sitting in her wheelchair with her legs in a  dependent position and there has been a lot more drainage and maceration on her foot. The dorsal foot wounds have expanded and are deeper. She has a new wound on her second toe and although the initial wound on her great toe has healed, she has a new wound on the lateral aspect of her great toe, immediately adjacent to that on her second toe, suggesting these have been caused by friction of the 2 toes rubbing against each other. Her son is participating in this visit via speaker phone. 10/24/2022: Last week she had a nurse visit due to clinic capacity and it appears that her dressing was done differently by the nurse that saw her then how it is usually performed by her regular nurse. Unfortunately, this meant there was more moisture-related tissue breakdown. The dorsum of her foot is open again and she has a new wound on the lateral aspect of her fifth toe. There is slough accumulation in each of the sites. 11/01/2022: There has been remarkable turnaround since her last visit. All of the wounds are smaller. The wound on the dorsal aspect of her great toe has closed completely. The wounds on the top of her foot have contracted and are starting to epithelialize. The new wound on the lateral aspect of her fifth toe does have some tendon exposure today that was not appreciated at her last visit. There is more tissue over the exposed bone on the plantar surface of her fourth toe, although bone does still remain exposed. 11/08/2022: All of her wounds are looking better again this week. The exposed bone on the plantar surface of the fourth toe has now been covered with soft tissue. The dorsal foot wounds are epithelializing nicely. The lateral fifth toe wound is smaller. There is slough on all of the surfaces. She had ABIs done in the vascular lab last week. Results are copied here: ABI Findings: +---------+------------------+-----+----------+--------+ Right Rt Pressure (mmHg)IndexWaveform Comment   +---------+------------------+-----+----------+--------+ Brachial 132     +---------+------------------+-----+----------+--------+ PTA 108 0.82 monophasic  +---------+------------------+-----+----------+--------+ DP 81 0.61 monophasic  +---------+------------------+-----+----------+--------+ Great T oe24 0.18 Abnormal   +---------+------------------+-----+----------+--------+ +---------+------------------+-----+----------+--------------------------------+ Left Lt Pressure (mmHg)IndexWaveform Comment  +---------+------------------+-----+----------+--------------------------------+ Brachial 121     +---------+------------------+-----+----------+--------------------------------+ PTA 88  0.67 monophasic  +---------+------------------+-----+----------+--------------------------------+ DP 111 0.84 monophasic  +---------+------------------+-----+----------+--------------------------------+ Great T   unable to obtain due to  oe     bandaging/ wound  +---------+------------------+-----+----------+--------------------------------+ +-------+-----------+-----------+------------+------------+ ABI/TBIT oday's ABIT oday's TBIPrevious ABIPrevious TBI +-------+-----------+-----------+------------+------------+ Right 0.82 0.18    +-------+-----------+-----------+------------+------------+ Left 0.84     +-------+-----------+-----------+------------+------------+ Summary: Right: Resting right ankle-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 The right toe-brachial index is abnormal. Left: Resting left ankle-brachial index indicates mild left lower extremity arterial disease. Unable to obtain TBI due to bandaging/wound. *See table(s) above for measurements and observations. 11/16/2022: All of her wounds are improving. The wound on the plantar surface of the fourth toe has good tissue covering the  bone. The dorsal foot areas are epithelializing. The lateral foot ulcer still has muscle exposed; this is probably the most severe of her wounds at this time. She is scheduled to see vascular surgery on August 20. [name], [name] ([id number]) [id number] Page 12 of 17 Patient History Information obtained from Patient. Family History Unknown History. Social History Former smoker - smoked when she was young, Marital Status - Widowed, Alcohol Use - Never, Drug Use - No History, Caffeine Use - Daily. Medical History Hematologic/Lymphatic Patient has history of Anemia Cardiovascular Patient has history of Angina - a-fib, Hypertension, Vasculitis Endocrine Denies history of Type I Diabetes, Type II Diabetes Musculoskeletal Patient has history of Osteoarthritis Hospitalization/Surgery History - cholecystectomy. - leg surgery. - tonsillectomy. 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 no acute distress. Vitals Time Taken: 7:53 AM, Height: 67 in, Weight: 153 lbs, BMI: 24, Temperature: 97.9 F, Pulse: 112 bpm, Respiratory Rate: 18 breaths/min, Blood Pressure: 101/62 mmHg. Respiratory Normal work of breathing on room air. General Notes: 11/16/2022: All of her wounds are improving. The wound on the plantar surface of the fourth toe has good tissue covering the bone. The dorsal foot areas are epithelializing. The lateral foot ulcer still has muscle exposed; this is probably the most severe of her wounds at this time. Integumentary (Hair, Skin) Wound #6 status is Open. Original cause of wound was Gradually Appeared. The date acquired was: 09/05/2022. The wound has been in treatment 10 weeks. The wound is located on the Left,Dorsal Foot. The wound measures 6cm length x 3.2cm width x 0.1cm depth; 15.08cm^2 area and  1.508cm^3 volume. There is Fat Layer (Subcutaneous Tissue) exposed. There is no tunneling or undermining noted. There is a medium amount of serosanguineous drainage noted. The wound margin is indistinct and nonvisible. There is small (1-33%) pink granulation within the wound bed. There is a large (67-100%) amount of necrotic tissue within the wound bed including Adherent Slough. The periwound skin appearance had no abnormalities noted for texture. The periwound skin appearance had no abnormalities noted for moisture. The periwound skin appearance exhibited: Rubor. Periwound temperature was noted as Cool/Cold. The periwound has tenderness on palpation. Wound #7 status is Open. Original cause of wound was Gradually Appeared. The date acquired was: 10/03/2022. The wound has been in treatment 6 weeks. The wound is located on the Left,Plantar T Fourth. The wound measures 0.3cm length x 0.4cm width x 0.1cm depth; 0.094cm^2 area and 0.009cm^3 volume. oe There is Fat Layer (Subcutaneous Tissue) exposed. There is no tunneling or undermining noted. There is a medium amount of serous drainage noted. The wound margin is flat and intact. There is small (1-33%) pink granulation within the wound bed. There is a large (67-100%) amount of  necrotic tissue within the wound bed including Eschar. The periwound skin appearance had no abnormalities noted for texture. The periwound skin appearance had no abnormalities noted for moisture. The periwound skin appearance exhibited: Rubor. Periwound temperature was noted as Cool/Cold. The periwound has tenderness on palpation. Wound #8 status is Open. Original cause of wound was Pressure Injury. The date acquired was: 10/24/2022. The wound has been in treatment 3 weeks. The wound is located on the Left,Lateral Foot. The wound measures 1cm length x 0.7cm width x 0.3cm depth; 0.55cm^2 area and 0.165cm^3 volume. There is Fat Layer (Subcutaneous Tissue) exposed. There is no tunneling or  undermining noted. There is a medium amount of serosanguineous drainage noted. The wound margin is distinct with the outline attached to the wound base. There is small (1-33%) red granulation within the wound bed. There is a large (67-100%) amount of necrotic tissue within the wound bed including Adherent Slough. The periwound skin appearance had no abnormalities noted for texture. The periwound skin appearance had no abnormalities noted for color. The periwound skin appearance exhibited: Dry/Scaly. Periwound temperature was noted as No Abnormality. The periwound has tenderness on palpation. Wound #9 status is Open. Original cause of wound was Gradually Appeared. The date acquired was: 11/01/2022. The wound has been in treatment 2 weeks. The wound is located on the [employer]. The wound measures 0.5cm length x 0.1cm width x 0.1cm depth; 0.039cm^2 area and 0.004cm^3 volume. There oe is Fat Layer (Subcutaneous Tissue) exposed. There is no tunneling or undermining noted. There is a small amount of serous drainage noted. The wound margin is flat and intact. There is large (67-100%) pink granulation within the wound bed. There is a small (1-33%) amount of necrotic tissue within the wound bed including Adherent Slough. The periwound skin appearance had no abnormalities noted for texture. The periwound skin appearance had no abnormalities noted for moisture. The periwound skin appearance exhibited: Rubor. Periwound temperature was noted as Cool/Cold. The periwound has tenderness on palpation. 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 There was a Selective/Open Wound Non-Viable Tissue Debridement with a total area of 11.3 sq cm performed by [name], MD. With the following instrument(s): Curette to remove Non-Viable tissue/material. Material removed includes [facility] after achieving pain control using Lidocaine 4% Topical Solution. No specimens were taken. A time out was conducted at 08:16, prior to the start of the procedure. A Minimum amount of bleeding was controlled with Pressure. The procedure was tolerated well. Post Debridement Measurements: 6cm length x 3.2cm width x 0.1cm depth; 1.508cm^3 volume. Character of Wound/Ulcer Post Debridement is improved. Post procedure Diagnosis Wound #6: Same as Pre-Procedure General Notes: scribed for Dr. Lady Gary by [name], RN. Pre-procedure diagnosis of Wound #6 is a Lymphedema located on the Left,Dorsal Foot . There was a Double Layer Compression Therapy Procedure by [name], RN. Post procedure Diagnosis Wound #6: Same as Pre-Procedure Wound #7 Pre-procedure diagnosis of Wound #7  is an Atypical located on the Left,Plantar T Fourth . There was a Selective/Open Wound Non-Viable Tissue oe Debridement with a total area of 0.09 sq cm performed by [name], MD. With the following instrument(s): Curette to remove Non-Viable tissue/material. Material removed includes Eschar after achieving pain control using Lidocaine 4% Topical Solution. No specimens were taken. A time out was conducted at 08:16, prior to the start of the procedure. A Minimum amount of bleeding was controlled with Pressure. The procedure was  tolerated well. Post Debridement Measurements: 0.3cm length x 0.4cm width x 0.1cm depth; 0.009cm^3 volume. Character of Wound/Ulcer Post Debridement is improved. Post procedure Diagnosis Wound #7: Same as Pre-Procedure General Notes: scribed for Dr. Lady Gary by [name], RN. Wound #8 Pre-procedure diagnosis of Wound #8 is a Pressure Ulcer located on the Left,Lateral Foot . There was a Excisional Skin/Subcutaneous Tissue/Muscle Debridement with a total area of 0.55 sq cm performed by [name], MD. With the following instrument(s): Curette to remove Non-Viable tissue/material. Material removed includes Muscle and Slough and after achieving pain control using Lidocaine 4% Topical Solution. No specimens were taken. A time out was conducted at 08:16, prior to the start of the procedure. A Minimum amount of bleeding was controlled with Pressure. The procedure was tolerated well. Post Debridement Measurements: 1cm length x 0.7cm width x 0.3cm depth; 0.165cm^3 volume. Post debridement Stage noted as Unstageable/Unclassified. Character of Wound/Ulcer Post Debridement is improved. Post procedure Diagnosis Wound #8: Same as Pre-Procedure General Notes: scribed for Dr. Lady Gary by [name], RN. 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 There was a Selective/Open Wound Non-Viable Tissue oe Debridement with a total area of 0.04 sq cm performed by [name], MD. With the following instrument(s): Curette to remove Non-Viable tissue/material. Material removed includes [facility] after achieving pain control using Lidocaine 4% Topical Solution. No specimens were taken. A time out was conducted at 08:16, prior to the start of the procedure. A Minimum amount of bleeding was controlled with Pressure. The procedure was tolerated well. Post Debridement Measurements: 0.5cm length x 0.1cm width x 0.1cm depth; 0.004cm^3 volume. Character  of Wound/Ulcer Post Debridement is improved. Post procedure Diagnosis Wound #9: Same as Pre-Procedure General Notes: scribed for Dr. Lady Gary by [name], RN. Plan Follow-up Appointments: Return Appointment in 1 week. - Dr. Lady Gary - room 2 DO NOT CHANGE DRESSING - Anesthetic: (In clinic) Topical Lidocaine 4% applied to wound bed - USED in Clinic Bathing/ Shower/ Hygiene: May shower with protection but do not get wound dressing(s) wet. Protect dressing(s) with water repellant cover (for example, large plastic bag) or a cast cover and may then take shower. Edema Control - Lymphedema / SCD / Other: Elevate legs to the level of the heart or above for 30 minutes daily and/or when sitting for 3-4 times a day throughout the day. Avoid standing for long periods of time. Non Wound Condition: Other Non Wound Condition Orders/Instructions: - urgo lite or 3 layer compression wrap on right and left lower leg [name], [name] ([id number]) [phone].pdf Page 14 of 17 The following medication(s) was prescribed: lidocaine topical 4 % cream cream topical was prescribed at facility WOUND #6: - Foot Wound Laterality: Dorsal, Left Cleanser: Soap and Water 1 x Per Week/30 Days Discharge Instructions: May shower and wash wound with dial antibacterial soap and water prior to dressing change. Cleanser: Wound Cleanser 1 x Per Week/30 Days Discharge Instructions: Cleanse the wound with wound cleanser prior to  applying a clean dressing using gauze sponges, not tissue or cotton balls. Peri-Wound Care: Sween Lotion (Moisturizing lotion) 1 x Per Week/30 Days Discharge Instructions: Apply moisturizing lotion as directed Topical: Gentamicin 1 x Per Week/30 Days Discharge Instructions: As directed by physician Topical: Mupirocin Ointment 1 x Per Week/30 Days Discharge Instructions: Apply Mupirocin (Bactroban) as instructed Topical: Ketoconazole Cream 2% 1 x Per Week/30 Days Discharge  Instructions: Apply Ketoconazole as directed Topical: Triamcinolone 1 x Per Week/30 Days Discharge Instructions: Apply Triamcinolone as directed Topical: zinc 1 x Per Week/30 Days Prim Dressing: Maxorb Extra Ag+ Alginate Dressing, 4x4.75 (in/in) 1 x Per Week/30 Days ary Discharge Instructions: Apply to wound bed as instructed Secondary Dressing: ABD Pad, 8x10 1 x Per Week/30 Days Discharge Instructions: Apply over primary dressing as directed. Secondary Dressing: Drawtex 4x4 in 1 x Per Week/30 Days Discharge Instructions: Apply over primary dressing as directed. Secondary Dressing: Woven Gauze Sponge, Non-Sterile 4x4 in 1 x Per Week/30 Days Discharge Instructions: can use gauze between toes (if no desired) Secondary Dressing: Zetuvit Plus 4x8 in 1 x Per Week/30 Days Discharge Instructions: Apply over primary dressing as directed. Com pression Wrap: Urgo K2 Lite, (equivalent to a 3 layer) two layer compression system, regular 1 x Per Week/30 Days Discharge Instructions: Apply Urgo K2 Lite as directed (alternative to 3 layer compression). WOUND #7: - T Fourth Wound Laterality: Plantar, Left oe Cleanser: Soap and Water 1 x Per Week/30 Days Discharge Instructions: May shower and wash wound with dial antibacterial soap and water prior to dressing change. Cleanser: Wound Cleanser 1 x Per Week/30 Days Discharge Instructions: Cleanse the wound with wound cleanser prior to applying a clean dressing using gauze sponges, not tissue or cotton balls. Peri-Wound Care: Sween Lotion (Moisturizing lotion) 1 x Per Week/30 Days Discharge Instructions: Apply moisturizing lotion as directed Topical: Gentamicin 1 x Per Week/30 Days Discharge Instructions: As directed by physician Topical: Mupirocin Ointment 1 x Per Week/30 Days Discharge Instructions: Apply Mupirocin (Bactroban) as instructed Topical: Ketoconazole Cream 2% 1 x Per Week/30 Days Discharge Instructions: Apply Ketoconazole as directed Topical:  Triamcinolone 1 x Per Week/30 Days Discharge Instructions: Apply Triamcinolone as directed Topical: zinc 1 x Per Week/30 Days Prim Dressing: Maxorb Extra Ag+ Alginate Dressing, 2x2 (in/in) 1 x Per Week/30 Days ary Discharge Instructions: Apply to wound bed as instructed Secondary Dressing: ABD Pad, 8x10 1 x Per Week/30 Days Discharge Instructions: Apply over primary dressing as directed. Secondary Dressing: Drawtex 4x4 in 1 x Per Week/30 Days Discharge Instructions: Apply over primary dressing as directed. Secondary Dressing: Woven Gauze Sponge, Non-Sterile 4x4 in 1 x Per Week/30 Days Discharge Instructions: can use gauze between toes (if no desired) Secondary Dressing: Zetuvit Plus 4x8 in 1 x Per Week/30 Days Discharge Instructions: Apply over primary dressing as directed. Com pression Wrap: Urgo K2 Lite, (equivalent to a 3 layer) two layer compression system, regular 1 x Per Week/30 Days Discharge Instructions: Apply Urgo K2 Lite as directed (alternative to 3 layer compression). WOUND #8: - Foot Wound Laterality: Left, Lateral Cleanser: Soap and Water 1 x Per Week/30 Days Discharge Instructions: May shower and wash wound with dial antibacterial soap and water prior to dressing change. Cleanser: Wound Cleanser 1 x Per Week/30 Days Discharge Instructions: Cleanse the wound with wound cleanser prior to applying a clean dressing using gauze sponges, not tissue or cotton balls. Peri-Wound Care: Sween Lotion (Moisturizing lotion) 1 x Per Week/30 Days Discharge Instructions: Apply moisturizing lotion as directed Topical: Gentamicin 1 x Per Week/30  Days Discharge Instructions: As directed by physician Topical: Mupirocin Ointment 1 x Per Week/30 Days Discharge Instructions: Apply Mupirocin (Bactroban) as instructed Topical: Ketoconazole Cream 2% 1 x Per Week/30 Days Discharge Instructions: Apply Ketoconazole as directed Topical: Triamcinolone 1 x Per Week/30 Days Discharge Instructions: Apply  Triamcinolone as directed Topical: zinc 1 x Per Week/30 Days Prim Dressing: Endoform 2x2 in 1 x Per Week/30 Days ary Discharge Instructions: Moisten with saline Secondary Dressing: ABD Pad, 8x10 1 x Per Week/30 Days Discharge Instructions: Apply over primary dressing as directed. Secondary Dressing: Drawtex 4x4 in 1 x Per Week/30 Days Discharge Instructions: Apply over primary dressing as directed. Secondary Dressing: Woven Gauze Sponge, Non-Sterile 4x4 in 1 x Per Week/30 Days Discharge Instructions: can use gauze between toes (if no desired) Secondary Dressing: Zetuvit Plus 4x8 in 1 x Per Week/30 Days Discharge Instructions: Apply over primary dressing as directed. Com pression Wrap: Urgo K2 Lite, (equivalent to a 3 layer) two layer compression system, regular 1 x Per Week/30 Days Discharge Instructions: Apply Urgo K2 Lite as directed (alternative to 3 layer compression). WOUND #9: - T Great Wound Laterality: Left, Lateral oe Cleanser: Soap and Water 1 x Per Week/30 Days Discharge Instructions: May shower and wash wound with dial antibacterial soap and water prior to dressing change. Cleanser: Wound Cleanser 1 x Per Week/30 Days Discharge Instructions: Cleanse the wound with wound cleanser prior to applying a clean dressing using gauze sponges, not tissue or cotton balls. Peri-Wound Care: Sween Lotion (Moisturizing lotion) 1 x Per Week/30 Days Discharge Instructions: Apply moisturizing lotion as directed BUYS, Shelvy ([id number]) [phone].pdf Page 15 of 17 Topical: Gentamicin 1 x Per Week/30 Days Discharge Instructions: As directed by physician Topical: Mupirocin Ointment 1 x Per Week/30 Days Discharge Instructions: Apply Mupirocin (Bactroban) as instructed Topical: Ketoconazole Cream 2% 1 x Per Week/30 Days Discharge Instructions: Apply Ketoconazole as directed Topical: Triamcinolone 1 x Per Week/30 Days Discharge Instructions: Apply Triamcinolone as  directed Topical: zinc 1 x Per Week/30 Days Prim Dressing: Maxorb Extra Ag+ Alginate Dressing, 2x2 (in/in) 1 x Per Week/30 Days ary Discharge Instructions: Apply to wound bed as instructed Secondary Dressing: ABD Pad, 8x10 1 x Per Week/30 Days Discharge Instructions: Apply over primary dressing as directed. Secondary Dressing: Drawtex 4x4 in 1 x Per Week/30 Days Discharge Instructions: Apply over primary dressing as directed. Secondary Dressing: Woven Gauze Sponge, Non-Sterile 4x4 in 1 x Per Week/30 Days Discharge Instructions: can use gauze between toes (if no desired) Secondary Dressing: Zetuvit Plus 4x8 in 1 x Per Week/30 Days Discharge Instructions: Apply over primary dressing as directed. Com pression Wrap: Urgo K2 Lite, (equivalent to a 3 layer) two layer compression system, regular 1 x Per Week/30 Days Discharge Instructions: Apply Urgo K2 Lite as directed (alternative to 3 layer compression). 11/16/2022: All of her wounds are improving. The wound on the plantar surface of the fourth toe has good tissue covering the bone. The dorsal foot areas are epithelializing. The lateral foot ulcer still has muscle exposed; this is probably the most severe of her wounds at this time. I used a curette to debride slough and eschar off of her wounds. On the lateral foot ulcer, I also debrided subcutaneous tissue and some nonviable muscle. We will continue to apply the mixture of ketoconazole, gentamicin, mupirocin, and triamcinolone to her entire foot. Continue silver alginate to all of the open wounds except for the lateral ulcer, upon which we are applying endoform. Continue Urgo lite compression. Follow-up in 1 week Electronic Signature(s)  Signed: 11/16/2022 8:30:38 AM By: [name] MD FACS Entered By: [name] on 11/16/2022 08:30:38 -------------------------------------------------------------------------------- HxROS Details Patient Name: Date of Service: [name], [facility] [name]  11/16/2022 7:45 A M Medical Record Number: [id number] Patient Account Number: [id number] Date of Birth/Sex: Treating RN: Nov 13, 1940 (82 y.o. 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 Endocrine Medical History: Negative for: Type I Diabetes; Type II Diabetes Past Medical History Notes: hypothyroidism Musculoskeletal Medical History: Positive for: Osteoarthritis Past Medical History NotesKAHLANI, GEORGI ([id number]) 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 Neurologic Medical History: Past Medical History Notes: stroke Immunizations Pneumococcal Vaccine: Received Pneumococcal Vaccination: Yes Received Pneumococcal Vaccination On or After 60th Birthday: Yes Implantable Devices None Hospitalization / Surgery History Type of Hospitalization/Surgery cholecystectomy leg surgery tonsillectomy Family and Social History Unknown History: Yes; Former smoker - smoked when she was young; Marital Status - Widowed; Alcohol Use: Never; Drug Use: No History; Caffeine Use: Daily; Financial Concerns: No; Food, Clothing or Shelter Needs: No; Support System Lacking: No; Transportation Concerns: No Electronic Signature(s) Signed: 11/16/2022 9:17:42 AM By: [name] MD FACS Entered By: [name] on 11/16/2022 08:28:36 -------------------------------------------------------------------------------- SuperBill Details Patient Name: Date of Service: [name], [facility] [name] 11/16/2022 Medical Record Number: [id number] Patient Account Number: [id number] Date of Birth/Sex: Treating RN: 03/16/1941 (82 y.o. 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 I10 Essential (primary) hypertension 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 atrial fibrillation Z79.01 Long term (current) use of anticoagulants I89.0 Lymphedema, not elsewhere classified Facility Procedures : CPT4 Code: 13244010 Description: 11043 - DEB MUSC/FASCIA 20 SQ CM/< ICD-10 Diagnosis Description L97.525 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 wit Modifier: hout evidence of ne Quantity: 1 crosis : [place] CPT4 Code: [id number] , Ramsey (4034 Description: 97597 - DEBRIDE WOUND 1ST 20 SQ CM OR < ICD-10 Diagnosis Description 74259) [phone] L97.522 Non-pressure chronic ulcer of other part of left foot with fat layer exposed L97.526 Non-pressure chronic ulcer of other part of left  foot with bone involvement without evid Modifier: [id number] P ence of necrosis Quantity: 1 age 59 of 42 Physician Procedures : CPT4 Code Description Modifier [phone] 99214 - WC PHYS LEVEL 4 - EST PT 25 ICD-10 Diagnosis Description L97.522 Non-pressure chronic ulcer of other part of left foot with fat layer exposed L97.526 Non-pressure chronic ulcer of other part of left foot  with bone involvement without evidence of necro 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 Quantity: 1 sis : [id number] 11043 - WC PHYS DEBR MUSCLE/FASCIA 20 SQ CM ICD-10 Diagnosis Description L97.525  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

## 2022-11-16 NOTE — Progress Notes (Signed)
[name], [name] ([id number]) [id number] Page 1 of 13 Visit Report for 11/16/2022 Arrival Information Details Patient Name: Date of Service: [name] RO N 11/16/2022 7:45 A M Medical Record Number: [id number] Patient Account Number: [id number] Date of Birth/Sex: Treating RN: [birth year] (82 y.o. [name] Primary Care [name]: Richrd Joseph, [facility] Other Clinician: Referring [name]: Treating [name]/Extender: [name], [facility] [name] Weeks in Treatment: 22 Visit Information History Since Last Visit Added or deleted any medications: No Patient Arrived: Wheel Chair Any new allergies or adverse reactions: No Arrival Time: 07:44 Had a fall or experienced change in No Accompanied By: caregiver activities of daily living that may affect Transfer Assistance: Manual risk of falls: Patient Identification Verified: Yes Signs or symptoms of abuse/neglect since last visito No Secondary Verification Process Completed: Yes Hospitalized since last visit: No Patient Requires Transmission-Based Precautions: No Implantable device outside of the clinic excluding No Patient Has Alerts: No cellular tissue based products placed in the Joseph since last visit: Has Dressing in Place as Prescribed: Yes Has Compression in Place as Prescribed: Yes Pain Present Now: No Electronic Signature(s) Signed: 11/16/2022 11:40:21 AM By: [name] Entered By: [name] on 11/16/2022 07:53:54 -------------------------------------------------------------------------------- Compression Therapy Details Patient Name: Date of Service: [name] [facility] RO N 11/16/2022 7:45 A M Medical Record Number: [id number] Patient Account Number: [id number] Date of Birth/Sex: Treating RN: June 09, 1940 (82 y.o. [name] Primary Care [name]: Richrd Joseph, [facility] Other Clinician: Referring Keyly Baldonado: Treating [name]: [name] [name], [facility] Weeks in  Treatment: 22 Compression Therapy Performed for Wound Assessment: Wound #6 Left,Dorsal Foot Performed By: Clinician Kathryn Bruin, RN Compression Type: Double Layer Post Procedure Diagnosis Same as Pre-procedure Electronic Signature(s) Signed: 11/16/2022 11:40:21 AM By: [name] Entered By: [name] on 11/16/2022 08:21:49 Spaugh, [name] ([id number]) [id number] Page 2 of 13 -------------------------------------------------------------------------------- Encounter Discharge Information Details Patient Name: Date of Service: [name] 11/16/2022 7:45 A M Medical Record Number: [id number] Patient Account Number: [id number] Date of Birth/Sex: Treating RN: 09-Feb-1941 (82 y.o. [name] Primary Care Kalynn Declercq: Richrd Joseph, [facility] Other Clinician: Referring [name]: Treating [name]/Extender: [name] [name], [name] Weeks in Treatment: 45 Encounter Discharge Information Items Post Procedure Vitals Discharge Condition: Stable Temperature (F): 97.9 Ambulatory Status: Wheelchair Pulse (bpm): 112 Discharge Destination: Skilled Nursing Facility Respiratory Rate (breaths/min): 18 Telephoned: No Blood Pressure (mmHg): 101/62 Orders Sent: Yes Transportation: Private Auto Accompanied By: caregiver Schedule Follow-up Appointment: Yes Clinical Summary of Care: Patient Declined Electronic Signature(s) Signed: 11/16/2022 11:40:21 AM By: [name] Entered By: [name] on 11/16/2022 08:59:45 -------------------------------------------------------------------------------- Lower Extremity Assessment Details Patient Name: Date of Service: [name] [name] Joseph RO N 11/16/2022 7:45 A M Medical Record Number: [id number] Patient Account Number: [id number] Date of Birth/Sex: Treating RN: 12-05-1940 (82 y.o. [name] Primary Care Ramandeep Arington: Richrd Joseph, [facility] Other Clinician: Referring [name]: Treating  [name]/Extender: [name] [name], [name] Weeks in Treatment: 22 Edema Assessment Assessed: [Left: No] [Right: No] Edema: [Left: Ye] [Right: s] Calf Left: Right: Point of Measurement: From Medial Instep 28 cm Ankle Left: Right: Point of Measurement: From Medial Instep 19.8 cm Vascular Assessment Pulses: Dorsalis Pedis Palpable: [Left:Yes] Extremity colors, hair growth, and conditions: Extremity Color: [Left:Hyperpigmented] Hair Growth on Extremity: [Left:No] Temperature of Extremity: [Left:Cool] Capillary Refill: [Left:> 3 seconds] Dependent Rubor: [Left:Yes No] Electronic Signature(s) Signed: 11/16/2022 11:40:21 AM By: [name] Entered By: [name] on 11/16/2022 08:02:54 Falor, [name] ([id number]) [id number] Page 3 of 13 -------------------------------------------------------------------------------- Multi Wound Chart Details Patient Name:  Date of Service: [name] 11/16/2022 7:45 A M Medical Record Number: [id number] Patient Account Number: [id number] Date of Birth/Sex: Treating RN: [birth year] (82 y.o. F) Primary Care Acelin Ferdig: Richrd Joseph, [name] Other Clinician: Referring [name]: Treating [name]/Extender: [name], [name] Weeks in Treatment: 22 Vital Signs Height(in): 67 Pulse(bpm): 112 Weight(lbs): 153 Blood Pressure(mmHg): 101/62 Body Mass Index(BMI): 24 Temperature(F): 97.9 Respiratory Rate(breaths/min): 18 [6:Photos:] Left, Dorsal Foot Left, Plantar T Fourth oe Left, Lateral Foot Wound Location: Gradually Appeared Gradually Appeared Pressure Injury Wounding Event: Lymphedema Atypical Pressure Ulcer Primary Etiology: Anemia, Angina, Hypertension, Anemia, Angina, Hypertension, Anemia, Angina, Hypertension, Comorbid History: Vasculitis, Osteoarthritis Vasculitis, Osteoarthritis Vasculitis, Osteoarthritis 09/05/2022 10/03/2022 10/24/2022 Date Acquired: 10 6 3  Weeks of  Treatment: Open Open Open Wound Status: No No No Wound Recurrence: 6x3.2x0.1 0.3x0.4x0.1 1x0.7x0.3 Measurements Kathryn x W x D (cm) 15.08 0.094 0.55 A (cm) : rea 1.508 0.009 0.165 Volume (cm) : 61.20% -32.40% -180.60% % Reduction in A rea: 61.20% -28.60% -323.10% % Reduction in Volume: 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 Full Thickness With Exposed Support Unstageable/Unclassified Classification: Support Structures Structures Medium Medium Medium Exudate A mount: Serosanguineous Serous Serosanguineous Exudate Type: red, brown amber red, brown Exudate Color: Indistinct, nonvisible Flat and Intact Distinct, outline attached Wound Margin: Small (1-33%) Small (1-33%) Small (1-33%) Granulation A mount: Pink Pink Red Granulation Quality: Large (67-100%) Large (67-100%) Large (67-100%) Necrotic A mount: Adherent Slough Eschar Adherent Slough Necrotic Tissue: Fat Layer (Subcutaneous Tissue): Yes Fat Layer (Subcutaneous Tissue): Yes Fat Layer (Subcutaneous Tissue): Yes Exposed Structures: Fascia: No Fascia: No Fascia: No Tendon: No Tendon: No Tendon: No Muscle: No Muscle: No Muscle: No Joint: No Joint: No Joint: No Bone: No Bone: No Bone: No Medium (34-66%) Large (67-100%) Small (1-33%) Epithelialization: Debridement - Selective/Open Wound Debridement - Selective/Open Wound Debridement - Excisional Debridement: Pre-procedure Verification/Time Out 08:16 08:16 08:16 Taken: Lidocaine 4% Topical Solution Lidocaine 4% Topical Solution Lidocaine 4% Topical Solution Pain Control: Barrister's clerk, Slough Tissue Debrided: Non-Viable Tissue Non-Viable Tissue Skin/Subcutaneous Tissue/Muscle Level: 11.3 0.09 0.55 Debridement A (sq cm): rea Curette Curette Curette Instrument: Minimum Minimum Minimum Bleeding: Pressure Pressure Pressure Hemostasis A chieved: Procedure was tolerated well Procedure was tolerated well Procedure was tolerated well Debridement  Treatment Response: 6x3.2x0.1 0.3x0.4x0.1 1x0.7x0.3 Post Debridement Measurements Kathryn x W x D (cm) 1.508 0.009 0.165 Post Debridement Volume: (cm) N/A N/A Unstageable/Unclassified Post Debridement StageCLELLA, [name] ([id number]) [id number] Page 4 of 13 Excoriation: Yes No Abnormalities Noted No Abnormalities Noted Periwound Skin Texture: Maceration: Yes No Abnormalities Noted Dry/Scaly: Yes Periwound Skin Moisture: Rubor: Yes Rubor: Yes No Abnormalities Noted Periwound Skin Color: Cool/Cold Cool/Cold No Abnormality Temperature: Yes Yes Yes Tenderness on Palpation: Compression Therapy Debridement Debridement Procedures Performed: Debridement Wound Number: 9 N/A N/A Photos: No Photos N/A N/A Left, Lateral T Great oe N/A N/A Wound Location: Gradually Appeared N/A N/A Wounding Event: Auto-immune N/A N/A Primary Etiology: Anemia, Angina, Hypertension, N/A N/A Comorbid History: Vasculitis, Osteoarthritis 11/01/2022 N/A N/A Date Acquired: 2 N/A N/A Weeks of Treatment: Open N/A N/A Wound Status: No N/A N/A Wound Recurrence: 0.5x0.1x0.1 N/A N/A Measurements Kathryn x W x D (cm) 0.039 N/A N/A A (cm) : rea 0.004 N/A N/A Volume (cm) : 94.40% N/A N/A % Reduction in A rea: 94.20% N/A N/A % Reduction in Volume: 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 N/A N/A Classification: Support Structures Small N/A N/A Exudate A mount: Serous N/A N/A Exudate Type: amber N/A N/A Exudate Color: Flat and Intact N/A N/A Wound Margin: Large (67-100%) N/A  N/A Granulation A mount: Pink N/A N/A Granulation Quality: Small (1-33%) N/A N/A Necrotic A mount: Adherent Slough N/A N/A Necrotic Tissue: Fat Layer (Subcutaneous Tissue): Yes N/A N/A Exposed Structures: Fascia: No Tendon: No Muscle: No Joint: No Bone: No Large (67-100%) N/A N/A Epithelialization: Debridement - Selective/Open Wound N/A N/A Debridement: Pre-procedure Verification/Time Out 08:16 N/A  N/A Taken: Lidocaine 4% Topical Solution N/A N/A Pain Control: Slough N/A N/A Tissue Debrided: Non-Viable Tissue N/A N/A Level: 0.04 N/A N/A Debridement A (sq cm): rea Curette N/A N/A Instrument: Minimum N/A N/A Bleeding: Pressure N/A N/A Hemostasis A chieved: Procedure was tolerated well N/A N/A Debridement Treatment Response: 0.5x0.1x0.1 N/A N/A Post Debridement Measurements Kathryn x W x D (cm) 0.004 N/A N/A Post Debridement Volume: (cm) N/A N/A N/A Post Debridement Stage: No Abnormalities Noted N/A N/A Periwound Skin Texture: No Abnormalities Noted N/A N/A Periwound Skin Moisture: Rubor: Yes N/A N/A Periwound Skin Color: Cool/Cold N/A N/A Temperature: Yes N/A N/A Tenderness on Palpation: Debridement N/A N/A Procedures Performed: Treatment Notes Electronic Signature(s) Signed: 11/16/2022 8:27:02 AM By: Kathryn Guess MD FACS Entered By: [name] on 11/16/2022 08:27:02 -------------------------------------------------------------------------------- Multi-Disciplinary Care Plan Details Patient Name: Date of Service: [name], [facility] RO N 11/16/2022 7:45 A M Medical Record Number: [id number] Patient Account Number: [id number] MERRICK, PROBST ([id number]) [id number] Page 5 of 13 Date of Birth/Sex: Treating RN: August 08, 1940 (83 y.o. [name] Primary Care Maritta Kief: Richrd Joseph, [facility] Other Clinician: Referring Mea Ozga: Treating Shloimy Michalski/Extender: [name], [facility] Weeks in Treatment: 22 Multidisciplinary Care Plan reviewed with physician Active Inactive Necrotic Tissue Nursing Diagnoses: Impaired tissue integrity related to necrotic/devitalized tissue Knowledge deficit related to management of necrotic/devitalized tissue Goals: Necrotic/devitalized tissue will be minimized in the wound bed Date Initiated: 06/12/2022 Target Resolution Date: 12/21/2022 Goal Status: Active Patient/caregiver will verbalize  understanding of reason and process for debridement of necrotic tissue Date Initiated: 06/12/2022 Target Resolution Date: 12/21/2022 Goal Status: Active Interventions: Assess patient pain level pre-, during and post procedure and prior to discharge Provide education on necrotic tissue and debridement process Treatment Activities: Apply topical anesthetic as ordered : 06/12/2022 Notes: Wound/Skin Impairment Nursing Diagnoses: Impaired tissue integrity Knowledge deficit related to ulceration/compromised skin integrity Goals: Patient/caregiver will verbalize understanding of skin care regimen Date Initiated: 06/12/2022 Target Resolution Date: 12/21/2022 Goal Status: Active Interventions: Assess ulceration(s) every visit Treatment Activities: Skin care regimen initiated : 06/12/2022 Topical wound management initiated : 06/12/2022 Notes: Electronic Signature(s) Signed: 11/16/2022 11:40:21 AM By: [name] Entered By: [name] on 11/16/2022 08:58:43 -------------------------------------------------------------------------------- Pain Assessment Details Patient Name: Date of Service: [name] [facility] RO N 11/16/2022 7:45 A M Medical Record Number: [id number] Patient Account Number: [id number] Date of Birth/Sex: Treating RN: 07-05-1940 (82 y.o. [name] Primary Care Coren Crownover: Richrd Joseph, [facility] Other Clinician: Referring Ashlynne Shetterly: Treating Germaine Shenker/Extender: [name], [facility] Weeks in Treatment: [address] [place], [place] ([id number]) [id number] Page 6 of 13 Location of Pain Severity and Description of Pain Patient Has Paino No Site Locations Rate the pain. Current Pain Level: 0 Pain Management and Medication Current Pain Management: Electronic Signature(s) Signed: 11/16/2022 11:40:21 AM By: [name] Entered By: [name] on 11/16/2022  07:54:15 -------------------------------------------------------------------------------- Patient/Caregiver Education Details Patient Name: Date of Service: [name], [facility] RO N 7/26/2024andnbsp7:45 A M Medical Record Number: [id number] Patient Account Number: [id number] Date of Birth/Gender: Treating RN: 03/28/1941 (82 y.o. [name] Primary Care Physician: Richrd Joseph, [facility] Other Clinician: Referring Physician: Treating Physician/Extender: [name] KES, [facility] Weeks in  Treatment: 22 Education Assessment Education Provided To: Patient Education Topics Provided Wound/Skin Impairment: Methods: Explain/Verbal Responses: Reinforcements needed, State content correctly Electronic Signature(s) Signed: 11/16/2022 11:40:21 AM By: [name] Entered By: [name] on 11/16/2022 08:58:54 -------------------------------------------------------------------------------- Wound Assessment Details Patient Name: Date of Service: [name] RO N 11/16/2022 7:45 A Haskell Flirt, [place] ([id number]) [phone].pdf Page 7 of 13 Medical Record Number: [id number] Patient Account Number: [id number] Date of Birth/Sex: Treating RN: 1940/12/04 (82 y.o. [name] Primary Care Kempton Milne: Richrd Joseph, [facility] Other Clinician: Referring Audrielle Vankuren: Treating Kazim Corrales/Extender: [name] [name], [facility] Weeks in Treatment: 22 Wound Status Wound Number: 6 Primary Etiology: Lymphedema Wound Location: Left, Dorsal Foot Wound Status: Open Wounding Event: Gradually Appeared Comorbid History: Anemia, Angina, Hypertension, Vasculitis, Osteoarthritis Date Acquired: 09/05/2022 Weeks Of Treatment: 10 Clustered Wound: No Photos Wound Measurements Length: (cm) 6 Width: (cm) 3.2 Depth: (cm) 0.1 Area: (cm) 15.08 Volume: (cm) 1.508 % Reduction in Area: 61.2% % Reduction in Volume: 61.2% Epithelialization: Medium (34-66%) Tunneling: No Undermining:  No Wound Description Classification: 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 Support Structures Wound Margin: Indistinct, nonvisible Exudate Amount: Medium Exudate Type: Serosanguineous Exudate Color: red, brown Foul Odor After Cleansing: No Slough/Fibrino Yes Wound Bed Granulation Amount: Small (1-33%) Exposed Structure Granulation Quality: Pink Fascia Exposed: No Necrotic Amount: Large (67-100%) Fat Layer (Subcutaneous Tissue) Exposed: Yes Necrotic Quality: Adherent Slough Tendon Exposed: No Muscle Exposed: No Joint Exposed: No Bone Exposed: No Periwound Skin Texture Texture Color No Abnormalities Noted: Yes No Abnormalities Noted: No Rubor: Yes Moisture No Abnormalities Noted: Yes Temperature / Pain Temperature: Cool/Cold Tenderness on Palpation: Yes Treatment Notes Wound #6 (Foot) Wound Laterality: Dorsal, Left Cleanser Soap and Water Discharge Instruction: May shower and wash wound with dial antibacterial soap and water prior to dressing change. Wound Cleanser Discharge Instruction: Cleanse the wound with wound cleanser prior to applying a clean dressing using gauze sponges, not tissue or cotton balls. Peri-Wound Care Sween Lotion (Moisturizing lotion) Discharge Instruction: Apply moisturizing lotion as directed BEGNOCHE, [name] ([id number]) [phone].pdf Page 8 of 13 Topical Gentamicin Discharge Instruction: As directed by physician Mupirocin Ointment Discharge Instruction: Apply Mupirocin (Bactroban) as instructed Ketoconazole Cream 2% Discharge Instruction: Apply Ketoconazole as directed Triamcinolone Discharge Instruction: Apply Triamcinolone as directed zinc Primary Dressing Maxorb Extra Ag+ Alginate Dressing, 4x4.75 (in/in) Discharge Instruction: Apply to wound bed as instructed Secondary Dressing ABD Pad, 8x10 Discharge Instruction: Apply over primary dressing as directed. Drawtex 4x4 in Discharge Instruction: Apply over primary  dressing as directed. Woven Gauze Sponge, Non-Sterile 4x4 in Discharge Instruction: can use gauze between toes (if no desired) Zetuvit Plus 4x8 in Discharge Instruction: Apply over primary dressing as directed. Secured With Compression Wrap Urgo K2 Lite, (equivalent to a 3 layer) two layer compression system, regular Discharge Instruction: Apply Urgo K2 Lite as directed (alternative to 3 layer compression). Compression Stockings Add-Ons Electronic Signature(s) Signed: 11/16/2022 11:40:21 AM By: [name] Entered By: [name] on 11/16/2022 08:08:22 -------------------------------------------------------------------------------- Wound Assessment Details Patient Name: Date of Service: [name] [facility] RO N 11/16/2022 7:45 A M Medical Record Number: [id number] Patient Account Number: [id number] Date of Birth/Sex: Treating RN: [birth year] (82 y.o. [name] Primary Care Acasia Skilton: Richrd Joseph, [facility] Other Clinician: Referring Lipa Knauff: Treating Ahmarion Saraceno/Extender: [name] KES, [place] [facility] Weeks in Treatment: 22 Wound Status Wound Number: 7 Primary Etiology: Atypical Wound Location: Left, Plantar T Fourth oe Wound Status: Open Wounding Event: Gradually Appeared Comorbid History: Anemia, Angina, Hypertension, Vasculitis, Osteoarthritis Date Acquired: 10/03/2022 Weeks Of Treatment: 6 Clustered Wound: No  Photos [place], [place] ([id number]) [id number] Page 9 of 13 Wound Measurements Length: (cm) 0.3 Width: (cm) 0.4 Depth: (cm) 0.1 Area: (cm) 0.094 Volume: (cm) 0.009 % Reduction in Area: -32.4% % Reduction in Volume: -28.6% Epithelialization: Large (67-100%) Tunneling: No Undermining: No Wound Description Classification: Full Thickness With Exposed Support Structures Wound Margin: Flat and Intact Exudate Amount: Medium Exudate Type: Serous Exudate Color: amber Foul Odor After Cleansing: No Slough/Fibrino No Wound  Bed Granulation Amount: Small (1-33%) Exposed Structure Granulation Quality: Pink Fascia Exposed: No Necrotic Amount: Large (67-100%) Fat Layer (Subcutaneous Tissue) Exposed: Yes Necrotic Quality: Eschar Tendon Exposed: No Muscle Exposed: No Joint Exposed: No Bone Exposed: No Periwound Skin Texture Texture Color No Abnormalities Noted: Yes No Abnormalities Noted: No Rubor: Yes Moisture No Abnormalities Noted: Yes Temperature / Pain Temperature: Cool/Cold Tenderness on Palpation: Yes Treatment Notes Wound #7 (Toe Fourth) Wound Laterality: Plantar, Left Cleanser Soap and Water Discharge Instruction: May shower and wash wound with dial antibacterial soap and water prior to dressing change. Wound Cleanser Discharge Instruction: Cleanse the wound with wound cleanser prior to applying a clean dressing using gauze sponges, not tissue or cotton balls. Peri-Wound Care Sween Lotion (Moisturizing lotion) Discharge Instruction: Apply moisturizing lotion as directed Topical Gentamicin Discharge Instruction: As directed by physician Mupirocin Ointment Discharge Instruction: Apply Mupirocin (Bactroban) as instructed Ketoconazole Cream 2% Discharge Instruction: Apply Ketoconazole as directed Triamcinolone Discharge Instruction: Apply Triamcinolone as directed zinc Primary Dressing Maxorb Extra Ag+ Alginate Dressing, 2x2 (in/in) Discharge Instruction: Apply to wound bed as instructed Vonbargen, Lauriana ([id number]) [phone].pdf Page 10 of 13 Secondary Dressing ABD Pad, 8x10 Discharge Instruction: Apply over primary dressing as directed. Drawtex 4x4 in Discharge Instruction: Apply over primary dressing as directed. Woven Gauze Sponge, Non-Sterile 4x4 in Discharge Instruction: can use gauze between toes (if no desired) Zetuvit Plus 4x8 in Discharge Instruction: Apply over primary dressing as directed. Secured With Compression Wrap Urgo K2 Lite, (equivalent to  a 3 layer) two layer compression system, regular Discharge Instruction: Apply Urgo K2 Lite as directed (alternative to 3 layer compression). Compression Stockings Add-Ons Electronic Signature(s) Signed: 11/16/2022 11:40:21 AM By: [name] Entered By: [name] on 11/16/2022 08:09:03 -------------------------------------------------------------------------------- Wound Assessment Details Patient Name: Date of Service: [name] [facility] RO N 11/16/2022 7:45 A M Medical Record Number: [id number] Patient Account Number: [id number] Date of Birth/Sex: Treating RN: 08/23/1940 (82 y.o. [name] Primary Care Lavelle Berland: Richrd Joseph, [facility] Other Clinician: Referring Shaquanta Harkless: Treating Kayce Chismar/Extender: [name] [name], [facility] Weeks in Treatment: 22 Wound Status Wound Number: 8 Primary Etiology: Pressure Ulcer Wound Location: Left, Lateral Foot Wound Status: Open Wounding Event: Pressure Injury Comorbid History: Anemia, Angina, Hypertension, Vasculitis, Osteoarthritis Date Acquired: 10/24/2022 Weeks Of Treatment: 3 Clustered Wound: No Photos Wound Measurements Length: (cm) 1 Width: (cm) 0.7 Depth: (cm) 0.3 Area: (cm) 0.55 Volume: (cm) 0.165 % Reduction in Area: -180.6% % Reduction in Volume: -323.1% Epithelialization: Small (1-33%) Tunneling: No Undermining: No Wound Description Classification: Unstageable/Unclassified [name], [name] ([id number]) Wound Margin: Distinct, outline attached Exudate Amount: Medium Exudate Type: Serosanguineous Exudate Color: red, brown Foul Odor After Cleansing: No [id number] Page 11 of 13 Slough/Fibrino Yes Wound Bed Granulation Amount: Small (1-33%) Exposed Structure Granulation Quality: Red Fascia Exposed: No Necrotic Amount: Large (67-100%) Fat Layer (Subcutaneous Tissue) Exposed: Yes Necrotic Quality: Adherent Slough Tendon Exposed: No Muscle Exposed: No Joint Exposed: No Bone  Exposed: No Periwound Skin Texture Texture Color No Abnormalities Noted: Yes No Abnormalities Noted: Yes Moisture Temperature / Pain No Abnormalities Noted: No Temperature: No  Abnormality Dry / Scaly: Yes Tenderness on Palpation: Yes Treatment Notes Wound #8 (Foot) Wound Laterality: Left, Lateral Cleanser Soap and Water Discharge Instruction: May shower and wash wound with dial antibacterial soap and water prior to dressing change. Wound Cleanser Discharge Instruction: Cleanse the wound with wound cleanser prior to applying a clean dressing using gauze sponges, not tissue or cotton balls. Peri-Wound Care Sween Lotion (Moisturizing lotion) Discharge Instruction: Apply moisturizing lotion as directed Topical Gentamicin Discharge Instruction: As directed by physician Mupirocin Ointment Discharge Instruction: Apply Mupirocin (Bactroban) as instructed Ketoconazole Cream 2% Discharge Instruction: Apply Ketoconazole as directed Triamcinolone Discharge Instruction: Apply Triamcinolone as directed zinc Primary Dressing Endoform 2x2 in Discharge Instruction: Moisten with saline Secondary Dressing ABD Pad, 8x10 Discharge Instruction: Apply over primary dressing as directed. Drawtex 4x4 in Discharge Instruction: Apply over primary dressing as directed. Woven Gauze Sponge, Non-Sterile 4x4 in Discharge Instruction: can use gauze between toes (if no desired) Zetuvit Plus 4x8 in Discharge Instruction: Apply over primary dressing as directed. Secured With Compression Wrap Urgo K2 Lite, (equivalent to a 3 layer) two layer compression system, regular Discharge Instruction: Apply Urgo K2 Lite as directed (alternative to 3 layer compression). Compression Stockings Add-Ons Electronic Signature(s) Robinson, [name] ([id number]) [id number] Page 12 of 13 Signed: 11/16/2022 11:40:21 AM By: Gelene Mink By: [name] on 11/16/2022  08:09:30 -------------------------------------------------------------------------------- Wound Assessment Details Patient Name: Date of Service: [name] RO N 11/16/2022 7:45 A M Medical Record Number: [id number] Patient Account Number: [id number] Date of Birth/Sex: Treating RN: December 19, 1940 (82 y.o. [name] Primary Care Dymin Dingledine: Richrd Joseph, [facility] Other Clinician: Referring Karianna Gusman: Treating Tassie Pollett/Extender: [name] [name], [facility] Joseph Weeks in Treatment: 22 Wound Status Wound Number: 9 Primary Etiology: Auto-immune Wound Location: Left, Lateral T Great oe Wound Status: Open Wounding Event: Gradually Appeared Comorbid History: Anemia, Angina, Hypertension, Vasculitis, Osteoarthritis Date Acquired: 11/01/2022 Weeks Of Treatment: 2 Clustered Wound: No Wound Measurements Length: (cm) 0.5 Width: (cm) 0.1 Depth: (cm) 0.1 Area: (cm) 0.039 Volume: (cm) 0.004 % Reduction in Area: 94.4% % Reduction in Volume: 94.2% Epithelialization: Large (67-100%) Tunneling: No Undermining: No Wound Description Classification: 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 Support Structures Wound Margin: Flat and Intact Exudate Amount: Small Exudate Type: Serous Exudate Color: amber Foul Odor After Cleansing: No Slough/Fibrino Yes Wound Bed Granulation Amount: Large (67-100%) Exposed Structure Granulation Quality: Pink Fascia Exposed: No Necrotic Amount: Small (1-33%) Fat Layer (Subcutaneous Tissue) Exposed: Yes Necrotic Quality: Adherent Slough Tendon Exposed: No Muscle Exposed: No Joint Exposed: No Bone Exposed: No Periwound Skin Texture Texture Color No Abnormalities Noted: Yes No Abnormalities Noted: No Rubor: Yes Moisture No Abnormalities Noted: Yes Temperature / Pain Temperature: Cool/Cold Tenderness on Palpation: Yes Treatment Notes Wound #9 (Toe Great) Wound Laterality: Left, Lateral Cleanser Soap and Water Discharge Instruction: May shower and wash wound with  dial antibacterial soap and water prior to dressing change. Wound Cleanser Discharge Instruction: Cleanse the wound with wound cleanser prior to applying a clean dressing using gauze sponges, not tissue or cotton balls. Peri-Wound Care Sween Lotion (Moisturizing lotion) Discharge Instruction: Apply moisturizing lotion as directed ZELENY, [name] ([id number]) [phone].pdf Page 13 of 13 Topical Gentamicin Discharge Instruction: As directed by physician Mupirocin Ointment Discharge Instruction: Apply Mupirocin (Bactroban) as instructed Ketoconazole Cream 2% Discharge Instruction: Apply Ketoconazole as directed Triamcinolone Discharge Instruction: Apply Triamcinolone as directed zinc Primary Dressing Maxorb Extra Ag+ Alginate Dressing, 2x2 (in/in) Discharge Instruction: Apply to wound bed as instructed Secondary Dressing ABD Pad, 8x10 Discharge Instruction: Apply over primary dressing as  directed. Drawtex 4x4 in Discharge Instruction: Apply over primary dressing as directed. Woven Gauze Sponge, Non-Sterile 4x4 in Discharge Instruction: can use gauze between toes (if no desired) Zetuvit Plus 4x8 in Discharge Instruction: Apply over primary dressing as directed. Secured With Compression Wrap Urgo K2 Lite, (equivalent to a 3 layer) two layer compression system, regular Discharge Instruction: Apply Urgo K2 Lite as directed (alternative to 3 layer compression). Compression Stockings Add-Ons Electronic Signature(s) Signed: 11/16/2022 11:40:21 AM By: [name] Entered By: [name] on 11/16/2022 08:09:58 -------------------------------------------------------------------------------- Vitals Details Patient Name: Date of Service: [place] [name], [facility] RO N 11/16/2022 7:45 A M Medical Record Number: [id number] Patient Account Number: [id number] Date of Birth/Sex: Treating RN: 1941/01/10 (82 y.o. [name] Primary Care Marella Vanderpol: Richrd Joseph,  [facility] Other Clinician: Referring Wendal Wilkie: Treating Alexandr Oehler/Extender: [name] [name], [facility] Joseph Weeks in Treatment: 22 Vital Signs Time Taken: 07:53 Temperature (F): 97.9 Height (in): 67 Pulse (bpm): 112 Weight (lbs): 153 Respiratory Rate (breaths/min): 18 Body Mass Index (BMI): 24 Blood Pressure (mmHg): 101/62 Reference Range: 80 - 120 mg / dl Electronic Signature(s) Signed: 11/16/2022 11:40:21 AM By: [name] Entered By: [name] on 11/16/2022 07:54:10

## 2022-11-20 ENCOUNTER — Encounter (HOSPITAL_BASED_OUTPATIENT_CLINIC_OR_DEPARTMENT_OTHER): Payer: Medicare Other | Admitting: General Surgery

## 2022-11-20 DIAGNOSIS — L97522 Non-pressure chronic ulcer of other part of left foot with fat layer exposed: Secondary | ICD-10-CM | POA: Diagnosis not present

## 2022-11-20 NOTE — Progress Notes (Signed)
Kathryn, Joseph ([id number]) [id number] Page 1 of 13 Visit Report for 11/20/2022 Arrival Information Details Patient Name: Date of Service: [name] RO N 11/20/2022 10:45 A M Medical Record Number: [id number] Patient Account Number: [id number] Date of Birth/Sex: Treating RN: [birth year] (82 y.o. Fredderick Joseph Primary Care Tien Spooner: [name], [facility] Other Clinician: Referring Tanita Palinkas: Treating Lutisha Joseph/Extender: [name], [facility] Weeks in Treatment: 65 Visit Information History Since Last Visit Added or deleted any medications: No Patient Arrived: Wheel Chair Any new allergies or adverse reactions: No Arrival Time: 10:50 Had a fall or experienced change in No Accompanied By: caregiver activities of daily living that may affect Transfer Assistance: Manual risk of falls: Patient Identification Verified: Yes Signs or symptoms of abuse/neglect since last visito No Secondary Verification Process Completed: Yes Hospitalized since last visit: No Patient Requires Transmission-Based Precautions: No Implantable device outside of the clinic excluding No Patient Has Alerts: No cellular tissue based products placed in the center since last visit: Has Dressing in Place as Prescribed: Yes Has Compression in Place as Prescribed: Yes Pain Present Now: No Electronic Signature(s) Signed: 11/20/2022 4:02:14 PM By: [name] Entered By: [name] on 11/20/2022 10:50:32 -------------------------------------------------------------------------------- Compression Therapy Details Patient Name: Date of Service: [name] [facility] RO N 11/20/2022 10:45 A M Medical Record Number: [id number] Patient Account Number: [id number] Date of Birth/Sex: Treating RN: 14-Oct-1940 (82 y.o. Fredderick Joseph Primary Care Beatrix Breece: [name], [facility] Other Clinician: Referring Dawsyn Zurn: Treating Suyash Amory/Extender: [name] SHO [name], [facility] Weeks in  Treatment: 23 Compression Therapy Performed for Wound Assessment: Wound #8 Left,Lateral Foot Performed By: Clinician [name], RN Compression Type: Double Layer Post Procedure Diagnosis Same as Pre-procedure Electronic Signature(s) Signed: 11/20/2022 4:02:14 PM By: Gelene Mink By: [name] on 11/20/2022 11:19:11 Stokke, Sneha ([id number]) [id number] Page 2 of 13 -------------------------------------------------------------------------------- Encounter Discharge Information Details Patient Name: Date of Service: [name] 11/20/2022 10:45 A M Medical Record Number: [id number] Patient Account Number: [id number] Date of Birth/Sex: Treating RN: 02/11/1941 (82 y.o. Fredderick Joseph Primary Care Hilton Saephan: [name], [facility] Other Clinician: Referring Shametra Cumberland: Treating Glade Strausser/Extender: [name] SHO [name], [facility] Weeks in Treatment: 66 Encounter Discharge Information Items Post Procedure Vitals Discharge Condition: Stable Temperature (F): 97.8 Ambulatory Status: Wheelchair Pulse (bpm): 80 Discharge Destination: Skilled Nursing Facility Respiratory Rate (breaths/min): 18 Telephoned: No Blood Pressure (mmHg): 112/72 Orders Sent: Yes Transportation: Private Auto Accompanied By: caregiver Schedule Follow-up Appointment: Yes Clinical Summary of Care: Patient Declined Electronic Signature(s) Signed: 11/20/2022 4:02:14 PM By: [name] Entered By: [name] on 11/20/2022 12:01:27 -------------------------------------------------------------------------------- Lower Extremity Assessment Details Patient Name: Date of Service: [name] RO N 11/20/2022 10:45 A M Medical Record Number: [id number] Patient Account Number: [id number] Date of Birth/Sex: Treating RN: 1941/01/04 (82 y.o. Fredderick Joseph Primary Care Jaquitta Dupriest: [name], [facility] Other Clinician: Referring Zauria Dombek: Treating  Noelle Hoogland/Extender: [name] SHO [name], [facility] Weeks in Treatment: 23 Edema Assessment Assessed: [Left: No] [Right: No] Edema: [Left: Ye] [Right: s] Calf Left: Right: Point of Measurement: From Medial Instep 28 cm Ankle Left: Right: Point of Measurement: From Medial Instep 19.2 cm Vascular Assessment Pulses: Dorsalis Pedis Palpable: [Left:Yes] Extremity colors, hair growth, and conditions: Extremity Color: [Left:Hyperpigmented] Hair Growth on Extremity: [Left:No] Temperature of Extremity: [Left:Cool] Capillary Refill: [Left:> 3 seconds] Dependent Rubor: [Left:Yes No] Electronic Signature(s) Signed: 11/20/2022 4:02:14 PM By: [name] Entered By: [name] on 11/20/2022 10:59:02 Joseph, Kathryn ([id number]) [id number] Page 3 of 13 -------------------------------------------------------------------------------- Multi Wound Chart Details Patient Name:  Date of Service: [name] 11/20/2022 10:45 A M Medical Record Number: [id number] Patient Account Number: [id number] Date of Birth/Sex: Treating RN: [birth year] (82 y.o. F) Primary Care Roman Dubuc: [name], [facility] Other Clinician: Referring Dalayla Aldredge: Treating Breyonna Nault/Extender: [name], [facility] Weeks in Treatment: 30 Vital Signs Height(in): 67 Pulse(bpm): 80 Weight(lbs): 153 Blood Pressure(mmHg): 112/72 Body Mass Index(BMI): 24 Temperature(F): 97.8 Respiratory Rate(breaths/min): 18 [6:Photos:] Left, Dorsal Foot Left, Plantar T Fourth oe Left, Lateral Foot Wound Location: Gradually Appeared Gradually Appeared Pressure Injury Wounding Event: Lymphedema Atypical Pressure Ulcer Primary Etiology: Anemia, Angina, Hypertension, Anemia, Angina, Hypertension, Anemia, Angina, Hypertension, Comorbid History: Vasculitis, Osteoarthritis Vasculitis, Osteoarthritis Vasculitis, Osteoarthritis 09/05/2022 10/03/2022 10/24/2022 Date Acquired: 10 6 3  Weeks of  Treatment: Open Open Open Wound Status: No No No Wound Recurrence: 6.5x4x0.1 0x0x0 1x0.6x0.3 Measurements L x W x D (cm) 20.42 0 0.471 A (cm) : rea 2.042 0 0.141 Volume (cm) : 47.50% 100.00% -140.30% % Reduction in A rea: 47.50% 100.00% -261.50% % Reduction in Volume: Full Thickness With Exposed Support 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 Classification: Structures Structures Medium None Present Medium Exudate A mount: Serosanguineous N/A Serosanguineous Exudate Type: red, brown N/A red, brown Exudate Color: Indistinct, nonvisible Flat and Intact Distinct, outline attached Wound Margin: Large (67-100%) None Present (0%) Small (1-33%) Granulation A mount: Pink N/A Red Granulation Quality: Small (1-33%) None Present (0%) Large (67-100%) Necrotic A mount: Fat Layer (Subcutaneous Tissue): Yes Fascia: No Fat Layer (Subcutaneous Tissue): Yes Exposed Structures: Tendon: Yes Fat Layer (Subcutaneous Tissue): No Tendon: Yes Fascia: No Tendon: No Muscle: Yes Muscle: No Muscle: No Fascia: No Joint: No Joint: No Joint: No Bone: No Bone: No Bone: No Medium (34-66%) Large (67-100%) Small (1-33%) Epithelialization: Debridement - Selective/Open Wound N/A Debridement - Selective/Open Wound Debridement: Pre-procedure Verification/Time Out 11:15 N/A 11:15 Taken: Lidocaine 4% Topical Solution N/A Lidocaine 4% Topical Solution Pain Control: Slough N/A Slough Tissue Debrided: Non-Viable Tissue N/A Non-Viable Tissue Level: 10.2 N/A 0.47 Debridement A (sq cm): rea Curette N/A Curette Instrument: Minimum N/A Minimum Bleeding: Pressure N/A Pressure Hemostasis A chieved: Procedure was tolerated well N/A Procedure was tolerated well Debridement Treatment Response: 6.5x4x0.1 N/A 1x0.6x0.3 Post Debridement Measurements L x W x D (cm) 2.042 N/A 0.141 Post Debridement Volume: (cm) N/A N/A Category/Stage IV Post Debridement Stage: Excoriation: Yes No  Abnormalities Noted No Abnormalities Noted Periwound Skin TextureRaavi Starliper, Calene ([id number]) [phone].pdf Page 4 of 13 Maceration: Yes No Abnormalities Noted Dry/Scaly: Yes Periwound Skin Moisture: Rubor: Yes Rubor: Yes No Abnormalities Noted Periwound Skin Color: Cool/Cold Cool/Cold No Abnormality Temperature: Yes N/A Yes Tenderness on Palpation: Debridement N/A Compression Therapy Procedures Performed: Debridement Wound Number: 9 N/A N/A Photos: N/A N/A Left, Lateral T Great oe N/A N/A Wound Location: Gradually Appeared N/A N/A Wounding Event: Auto-immune N/A N/A Primary Etiology: Anemia, Angina, Hypertension, N/A N/A Comorbid History: Vasculitis, Osteoarthritis 11/01/2022 N/A N/A Date Acquired: 2 N/A N/A Weeks of Treatment: Open N/A N/A Wound Status: No N/A N/A Wound Recurrence: 0.2x0.3x0.1 N/A N/A Measurements L x W x D (cm) 0.047 N/A N/A A (cm) : rea 0.005 N/A N/A Volume (cm) : 93.20% N/A N/A % Reduction in A rea: 92.80% N/A N/A % Reduction in Volume: 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 N/A N/A Classification: Support Structures Small N/A N/A Exudate A mount: Serous N/A N/A Exudate Type: amber N/A N/A Exudate Color: Flat and Intact N/A N/A Wound Margin: Large (67-100%) N/A N/A Granulation A mount: Pink N/A N/A Granulation Quality: Small (1-33%) N/A N/A Necrotic  A mount: Fat Layer (Subcutaneous Tissue): Yes N/A N/A Exposed Structures: Fascia: No Tendon: No Muscle: No Joint: No Bone: No Large (67-100%) N/A N/A Epithelialization: Debridement - Selective/Open Wound N/A N/A Debridement: Pre-procedure Verification/Time Out 11:15 N/A N/A Taken: Lidocaine 4% Topical Solution N/A N/A Pain Control: Slough N/A N/A Tissue Debrided: Non-Viable Tissue N/A N/A Level: 0.05 N/A N/A Debridement A (sq cm): rea Curette N/A N/A Instrument: Minimum N/A N/A Bleeding: Pressure N/A N/A Hemostasis A chieved: Procedure was  tolerated well N/A N/A Debridement Treatment Response: 0.2x0.3x0.1 N/A N/A Post Debridement Measurements L x W x D (cm) 0.005 N/A N/A Post Debridement Volume: (cm) N/A N/A N/A Post Debridement Stage: No Abnormalities Noted N/A N/A Periwound Skin Texture: No Abnormalities Noted N/A N/A Periwound Skin Moisture: Rubor: Yes N/A N/A Periwound Skin Color: Cool/Cold N/A N/A Temperature: Yes N/A N/A Tenderness on Palpation: Debridement N/A N/A Procedures Performed: Treatment Notes Electronic Signature(s) Signed: 11/20/2022 11:38:00 AM By: [name] MD FACS Entered By: [name] on 11/20/2022 11:37:59 Biggins, Azyah ([id number]) [id number] Page 5 of 13 -------------------------------------------------------------------------------- Multi-Disciplinary Care Plan Details Patient Name: Date of Service: [name] 11/20/2022 10:45 A M Medical Record Number: [id number] Patient Account Number: [id number] Date of Birth/Sex: Treating RN: 1940-07-09 (82 y.o. Fredderick Joseph Primary Care Mckensi Redinger: [name], [facility] Other Clinician: Referring Margreat Widener: Treating Harlee Pursifull/Extender: [name], [facility] Weeks in Treatment: 55 Multidisciplinary Care Plan reviewed with physician Active Inactive Necrotic Tissue Nursing Diagnoses: Impaired tissue integrity related to necrotic/devitalized tissue Knowledge deficit related to management of necrotic/devitalized tissue Goals: Necrotic/devitalized tissue will be minimized in the wound bed Date Initiated: 06/12/2022 Target Resolution Date: 12/21/2022 Goal Status: Active Patient/caregiver will verbalize understanding of reason and process for debridement of necrotic tissue Date Initiated: 06/12/2022 Target Resolution Date: 12/21/2022 Goal Status: Active Interventions: Assess patient pain level pre-, during and post procedure and prior to discharge Provide education on necrotic tissue and  debridement process Treatment Activities: Apply topical anesthetic as ordered : 06/12/2022 Notes: Wound/Skin Impairment Nursing Diagnoses: Impaired tissue integrity Knowledge deficit related to ulceration/compromised skin integrity Goals: Patient/caregiver will verbalize understanding of skin care regimen Date Initiated: 06/12/2022 Target Resolution Date: 12/21/2022 Goal Status: Active Interventions: Assess ulceration(s) every visit Treatment Activities: Skin care regimen initiated : 06/12/2022 Topical wound management initiated : 06/12/2022 Notes: Electronic Signature(s) Signed: 11/20/2022 4:02:14 PM By: [name] Entered By: [name] on 11/20/2022 12:00:12 Adamik, [name] ([id number]) [id number] Page 6 of 13 -------------------------------------------------------------------------------- Pain Assessment Details Patient Name: Date of Service: [name] 11/20/2022 10:45 A M Medical Record Number: [id number] Patient Account Number: [id number] Date of Birth/Sex: Treating RN: Apr 30, 1940 (82 y.o. Fredderick Joseph Primary Care Geoge Lawrance: [name], [facility] Other Clinician: Referring Jahden Schara: Treating Ivoree Felmlee/Extender: [name], [facility] Weeks in Treatment: 23 Active Problems Location of Pain Severity and Description of Pain Patient Has Paino No Site Locations Rate the pain. Current Pain Level: 0 Pain Management and Medication Current Pain Management: Electronic Signature(s) Signed: 11/20/2022 4:02:14 PM By: [name] Entered By: [name] on 11/20/2022 10:50:49 -------------------------------------------------------------------------------- Patient/Caregiver Education Details Patient Name: Date of Service: [name], [facility] RO N 7/30/2024andnbsp10:45 A M Medical Record Number: [id number] Patient Account Number: [id number] Date of Birth/Gender: Treating RN: 04/23/1941 (82 y.o. Fredderick Joseph Primary Care Physician: [name], [facility] Other Clinician: Referring Physician: Treating Physician/Extender: [name], [facility] Weeks in Treatment: 57 Education Assessment Education Provided To: Patient Education Topics Provided Wound/Skin Impairment: Methods: Explain/Verbal Responses: Reinforcements needed, State content  correctly Electronic Signature(s) Signed: 11/20/2022 4:02:14 PM By: Gelene Mink By: [name] on 11/20/2022 12:00:40 Garlow, [name] ([id number]) [id number] Page 7 of 13 -------------------------------------------------------------------------------- Wound Assessment Details Patient Name: Date of Service: [name] 11/20/2022 10:45 A M Medical Record Number: [id number] Patient Account Number: [id number] Date of Birth/Sex: Treating RN: 17-Aug-1940 (82 y.o. Fredderick Joseph Primary Care Malon Branton: [name], [facility] Other Clinician: Referring Dorathy Stallone: Treating Paz Winsett/Extender: [name] SHO [name], [facility] Weeks in Treatment: 23 Wound Status Wound Number: 6 Primary Etiology: Lymphedema Wound Location: Left, Dorsal Foot Wound Status: Open Wounding Event: Gradually Appeared Comorbid History: Anemia, Angina, Hypertension, Vasculitis, Osteoarthritis Date Acquired: 09/05/2022 Weeks Of Treatment: 10 Clustered Wound: No Photos Wound Measurements Length: (cm) Width: (cm) Depth: (cm) Area: (cm) Volume: (cm) 6.5 % Reduction in Area: 47.5% 4 % Reduction in Volume: 47.5% 0.1 Epithelialization: Medium (34-66%) 20.42 Tunneling: No 2.042 Undermining: No Wound Description Classification: Full Thickness With Exposed Suppor Wound Margin: Indistinct, nonvisible Exudate Amount: Medium Exudate Type: Serosanguineous Exudate Color: red, brown t Structures Foul Odor After Cleansing: No Slough/Fibrino Yes Wound Bed Granulation Amount: Large (67-100%) Exposed Structure Granulation Quality:  Pink Fascia Exposed: No Necrotic Amount: Small (1-33%) Fat Layer (Subcutaneous Tissue) Exposed: Yes Necrotic Quality: Adherent Slough Tendon Exposed: Yes Muscle Exposed: No Joint Exposed: No Bone Exposed: No Periwound Skin Texture Texture Color No Abnormalities Noted: Yes No Abnormalities Noted: No Rubor: Yes Moisture No Abnormalities Noted: Yes Temperature / Pain Temperature: Cool/Cold Tenderness on Palpation: Yes Treatment Notes Wound #6 (Foot) Wound Laterality: Dorsal, Left Winegardner, Carl ([id number]) [id number] Page 8 of 13 Cleanser Soap and Water Discharge Instruction: May shower and wash wound with dial antibacterial soap and water prior to dressing change. Wound Cleanser Discharge Instruction: Cleanse the wound with wound cleanser prior to applying a clean dressing using gauze sponges, not tissue or cotton balls. Peri-Wound Care Sween Lotion (Moisturizing lotion) Discharge Instruction: Apply moisturizing lotion as directed Topical Gentamicin Discharge Instruction: As directed by physician Mupirocin Ointment Discharge Instruction: Apply Mupirocin (Bactroban) as instructed Ketoconazole Cream 2% Discharge Instruction: Apply Ketoconazole as directed Triamcinolone Discharge Instruction: Apply Triamcinolone as directed zinc Primary Dressing Endoform 2x2 in Discharge Instruction: TO THE 2ND TOE AREA OF WOUND - Moisten with saline Maxorb Extra Ag+ Alginate Dressing, 4x4.75 (in/in) Discharge Instruction: Apply to wound bed as instructed Secondary Dressing ABD Pad, 8x10 Discharge Instruction: Apply over primary dressing as directed. Drawtex 4x4 in Discharge Instruction: Apply over primary dressing as directed. Woven Gauze Sponge, Non-Sterile 4x4 in Discharge Instruction: can use gauze between toes (if no desired) Zetuvit Plus 4x8 in Discharge Instruction: Apply over primary dressing as directed. Secured With Compression Wrap Urgo K2  Lite, (equivalent to a 3 layer) two layer compression system, regular Discharge Instruction: Apply Urgo K2 Lite as directed (alternative to 3 layer compression). Compression Stockings Add-Ons Electronic Signature(s) Signed: 11/20/2022 4:02:14 PM By: [name] Entered By: [name] on 11/20/2022 11:16:29 -------------------------------------------------------------------------------- Wound Assessment Details Patient Name: Date of Service: [name] RO N 11/20/2022 10:45 A M Medical Record Number: [id number] Patient Account Number: [id number] Date of Birth/Sex: Treating RN: 1940/10/10 (82 y.o. Fredderick Joseph Primary Care Kiaira Pointer: [name], [facility] Other Clinician: Referring Fartun Paradiso: Treating Marline Morace/Extender: [name] SHO KES, [facility] Weeks in Treatment: 23 Wound Status Wound Number: 7 Primary Etiology: Atypical Wound Location: Left, Plantar T Fourth oe Wound Status: Open Wounding Event: Gradually Appeared Comorbid History: Anemia, Angina, Hypertension, Vasculitis, Osteoarthritis Shreve, Fynn ([id number]) [id number] Page 9 of 13 Date Acquired: 10/03/2022 Weeks Of  Treatment: 6 Clustered Wound: No Photos Wound Measurements Length: (cm) Width: (cm) Depth: (cm) Area: (cm) Volume: (cm) 0 % Reduction in Area: 100% 0 % Reduction in Volume: 100% 0 Epithelialization: Large (67-100%) 0 Tunneling: No 0 Undermining: No Wound Description Classification: Full Thickness With Exposed Support Structures Wound Margin: Flat and Intact Exudate Amount: None Present Foul Odor After Cleansing: No Slough/Fibrino No Wound Bed Granulation Amount: None Present (0%) Exposed Structure Necrotic Amount: None Present (0%) Fascia Exposed: No Fat Layer (Subcutaneous Tissue) Exposed: No Tendon Exposed: No Muscle Exposed: No Joint Exposed: No Bone Exposed: No Periwound Skin Texture Texture Color No Abnormalities Noted: Yes No  Abnormalities Noted: No Rubor: Yes Moisture No Abnormalities Noted: Yes Temperature / Pain Temperature: Cool/Cold Electronic Signature(s) Signed: 11/20/2022 4:02:14 PM By: [name] Entered By: [name] on 11/20/2022 11:17:26 -------------------------------------------------------------------------------- Wound Assessment Details Patient Name: Date of Service: [name] RO N 11/20/2022 10:45 A M Medical Record Number: [id number] Patient Account Number: [id number] Date of Birth/Sex: Treating RN: 03/02/1941 (82 y.o. Fredderick Joseph Primary Care Allyiah Gartner: [name], [facility] Other Clinician: Referring Carroll Lingelbach: Treating Tylin Stradley/Extender: [name] SHO [name], [facility] Weeks in Treatment: 23 Wound Status Wound Number: 8 Primary Etiology: Pressure Ulcer Wound Location: Left, Lateral Foot Wound Status: Open Wounding Event: Pressure Injury Comorbid History: Anemia, Angina, Hypertension, Vasculitis, Osteoarthritis Date Acquired: 10/24/2022 AQUITA, QUINNELL ([id number]) [id number] Page 10 of 13 Weeks Of Treatment: 3 Clustered Wound: No Photos Wound Measurements Length: (cm) 1 Width: (cm) 0.6 Depth: (cm) 0.3 Area: (cm) 0.471 Volume: (cm) 0.141 % Reduction in Area: -140.3% % Reduction in Volume: -261.5% Epithelialization: Small (1-33%) Tunneling: No Undermining: No Wound Description Classification: Category/Stage IV Wound Margin: Distinct, outline attached Exudate Amount: Medium Exudate Type: Serosanguineous Exudate Color: red, brown Foul Odor After Cleansing: No Slough/Fibrino Yes Wound Bed Granulation Amount: Small (1-33%) Exposed Structure Granulation Quality: Red Fascia Exposed: No Necrotic Amount: Large (67-100%) Fat Layer (Subcutaneous Tissue) Exposed: Yes Necrotic Quality: Adherent Slough Tendon Exposed: Yes Muscle Exposed: Yes Necrosis of Muscle: Yes Joint Exposed: No Bone Exposed: No Periwound Skin  Texture Texture Color No Abnormalities Noted: Yes No Abnormalities Noted: Yes Moisture Temperature / Pain No Abnormalities Noted: Yes Temperature: No Abnormality Tenderness on Palpation: Yes Treatment Notes Wound #8 (Foot) Wound Laterality: Left, Lateral Cleanser Soap and Water Discharge Instruction: May shower and wash wound with dial antibacterial soap and water prior to dressing change. Wound Cleanser Discharge Instruction: Cleanse the wound with wound cleanser prior to applying a clean dressing using gauze sponges, not tissue or cotton balls. Peri-Wound Care Sween Lotion (Moisturizing lotion) Discharge Instruction: Apply moisturizing lotion as directed Topical Gentamicin Discharge Instruction: As directed by physician Mupirocin Ointment Discharge Instruction: Apply Mupirocin (Bactroban) as instructed Ketoconazole Cream 2% Discharge Instruction: Apply Ketoconazole as directed Triamcinolone Discharge Instruction: Apply Triamcinolone as directed GRAFTON, Zyion ([id number]) [phone].pdf Page 11 of 13 zinc Primary Dressing Endoform 2x2 in Discharge Instruction: Moisten with saline Secondary Dressing ABD Pad, 8x10 Discharge Instruction: Apply over primary dressing as directed. Drawtex 4x4 in Discharge Instruction: Apply over primary dressing as directed. Woven Gauze Sponge, Non-Sterile 4x4 in Discharge Instruction: can use gauze between toes (if no desired) Zetuvit Plus 4x8 in Discharge Instruction: Apply over primary dressing as directed. Secured With Compression Wrap Urgo K2 Lite, (equivalent to a 3 layer) two layer compression system, regular Discharge Instruction: Apply Urgo K2 Lite as directed (alternative to 3 layer compression). Compression Stockings Add-Ons Electronic Signature(s) Signed: 11/20/2022 4:02:14 PM By: [name] Entered By: [name] on 11/20/2022  11:18:16 -------------------------------------------------------------------------------- Wound Assessment Details Patient Name: Date of Service: [name] 11/20/2022 10:45 A M Medical Record Number: [id number] Patient Account Number: [id number] Date of Birth/Sex: Treating RN: [birth year] (82 y.o. Fredderick Joseph Primary Care Chloe Bluett: [name], [facility] Other Clinician: Referring Nova Schmuhl: Treating Zenab Gronewold/Extender: [name] SHO [name], [facility] Weeks in Treatment: 23 Wound Status Wound Number: 9 Primary Etiology: Auto-immune Wound Location: Left, Lateral T [place] oe Wound Status: Open Wounding Event: Gradually Appeared Comorbid History: Anemia, Angina, Hypertension, Vasculitis, Osteoarthritis Date Acquired: 11/01/2022 Weeks Of Treatment: 2 Clustered Wound: No Photos Wound Measurements Length: (cm) 0.2 Width: (cm) 0.3 Depth: (cm) 0.1 Area: (cm) 0.047 Rolland, Gilma ([id number]) Volume: (cm) 0.005 % Reduction in Area: 93.2% % Reduction in Volume: 92.8% Epithelialization: Large (67-100%) [id number].pdf Page 12 of 13 Wound Description Classification: 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 Support Structures Wound Margin: Flat and Intact Exudate Amount: Small Exudate Type: Serous Exudate Color: amber Foul Odor After Cleansing: No Slough/Fibrino Yes Wound Bed Granulation Amount: Large (67-100%) Exposed Structure Granulation Quality: Pink Fascia Exposed: No Necrotic Amount: Small (1-33%) Fat Layer (Subcutaneous Tissue) Exposed: Yes Necrotic Quality: Adherent Slough Tendon Exposed: No Muscle Exposed: No Joint Exposed: No Bone Exposed: No Periwound Skin Texture Texture Color No Abnormalities Noted: Yes No Abnormalities Noted: No Rubor: Yes Moisture No Abnormalities Noted: Yes Temperature / Pain Temperature: Cool/Cold Tenderness on Palpation: Yes Treatment Notes Wound #9 (Toe Great) Wound Laterality: Left, Lateral Cleanser Soap and  Water Discharge Instruction: May shower and wash wound with dial antibacterial soap and water prior to dressing change. Wound Cleanser Discharge Instruction: Cleanse the wound with wound cleanser prior to applying a clean dressing using gauze sponges, not tissue or cotton balls. Peri-Wound Care Sween Lotion (Moisturizing lotion) Discharge Instruction: Apply moisturizing lotion as directed Topical Gentamicin Discharge Instruction: As directed by physician Mupirocin Ointment Discharge Instruction: Apply Mupirocin (Bactroban) as instructed Ketoconazole Cream 2% Discharge Instruction: Apply Ketoconazole as directed Triamcinolone Discharge Instruction: Apply Triamcinolone as directed zinc Primary Dressing Endoform 2x2 in Discharge Instruction: Moisten with saline Secondary Dressing ABD Pad, 8x10 Discharge Instruction: Apply over primary dressing as directed. Drawtex 4x4 in Discharge Instruction: Apply over primary dressing as directed. Woven Gauze Sponge, Non-Sterile 4x4 in Discharge Instruction: can use gauze between toes (if no desired) Zetuvit Plus 4x8 in Discharge Instruction: Apply over primary dressing as directed. Secured With Compression Wrap Urgo K2 Lite, (equivalent to a 3 layer) two layer compression system, regular Discharge Instruction: Apply Urgo K2 Lite as directed (alternative to 3 layer compression). MADELAINE, [place]AMURA ([id number]) [id number] Page 13 of 13 Compression Stockings Add-Ons Electronic Signature(s) Signed: 11/20/2022 11:59:05 AM By: [name] Signed: 11/20/2022 4:02:14 PM By: [name] Entered By: [name] on 11/20/2022 11:06:36 -------------------------------------------------------------------------------- Vitals Details Patient Name: Date of Service: [place] [name], [facility] RO N 11/20/2022 10:45 A M Medical Record Number: [id number] Patient Account Number: [id number] Date of Birth/Sex: Treating RN: 1941/02/04 (82 y.o. Fredderick Joseph Primary Care Khalidah Herbold: [name], [facility] Other Clinician: Referring Audine Mangione: Treating Burgundy Matuszak/Extender: [name] SHO [name], [facility] Weeks in Treatment: 23 Vital Signs Time Taken: 10:50 Temperature (F): 97.8 Height (in): 67 Pulse (bpm): 80 Weight (lbs): 153 Respiratory Rate (breaths/min): 18 Body Mass Index (BMI): 24 Blood Pressure (mmHg): 112/72 Reference Range: 80 - 120 mg / dl Electronic Signature(s) Signed: 11/20/2022 4:02:14 PM By: [name] Entered By: [name] on 11/20/2022 10:50:44

## 2022-11-20 NOTE — Progress Notes (Signed)
[name] ([id number]) [id number] Page 1 of 15 Visit Report for 11/20/2022 Chief Complaint Document Details Patient Name: Date of Service: [name] [name] 11/20/2022 10:45 A M Medical Record Number: [id number] Patient Account Number: [id number] Date of Birth/Sex: Treating RN: August 10, 1940 (82 y.o. F) Primary Care Provider: [name], [facility] Other Clinician: Referring Provider: Treating Provider/Extender: [name], [facility] Weeks in Treatment: 91 Information Obtained from: Patient Chief Complaint Patient seen for complaints of Non-Healing Wound. Electronic Signature(s) Signed: 11/20/2022 11:38:40 AM By: [name] MD FACS Entered By: [name] on 11/20/2022 11:38:39 -------------------------------------------------------------------------------- Debridement Details Patient Name: Date of Service: [name], [name] 11/20/2022 10:45 A M Medical Record Number: [id number] Patient Account Number: [id number] Date of Birth/Sex: Treating RN: 31-Jan-1941 (82 y.o. [name] Primary Care Provider: [name], [facility] Other Clinician: Referring Provider: Treating Provider/Extender: [name], [facility] Weeks in Treatment: 23 Debridement Performed for Assessment: Wound #9 Left,Lateral T Great oe Performed By: Physician [name], MD Debridement Type: Debridement Level of Consciousness (Pre-procedure): Awake and Alert Pre-procedure Verification/Time Out Yes - 11:15 Taken: Start Time: 11:15 Pain Control: Lidocaine 4% T opical Solution Percent of Wound Bed Debrided: 100% T Area Debrided (cm): otal 0.05 Tissue and other material debrided: Non-Viable, Slough, Slough Level: Non-Viable Tissue Debridement Description: Selective/Open Wound Instrument: Curette Bleeding: Minimum Hemostasis Achieved: Pressure Response to Treatment: Procedure was tolerated well Level of Consciousness (Post- Awake and  Alert procedure): Post Debridement Measurements of Total Wound Length: (cm) 0.2 Width: (cm) 0.3 Depth: (cm) 0.1 Volume: (cm) 0.005 Character of Wound/Ulcer Post Debridement: Improved Post Procedure Diagnosis Same as Pre-procedure AMMIRATI, Zhanae ([id number]) [id number] Page 2 of 15 Notes scribed for Dr. Lady Gary by [name], RN Electronic Signature(s) Signed: 11/20/2022 1:00:30 PM By: [name] MD FACS Signed: 11/20/2022 4:02:14 PM By: Gelene Mink By: [name] on 11/20/2022 11:15:49 -------------------------------------------------------------------------------- Debridement Details Patient Name: Date of Service: [name], [facility] [name] 11/20/2022 10:45 A M Medical Record Number: [id number] Patient Account Number: [id number] Date of Birth/Sex: Treating RN: 1940-09-14 (82 y.o. [name] Primary Care Provider: [name], [facility] Other Clinician: Referring Provider: Treating Provider/Extender: [name], [facility] Weeks in Treatment: 23 Debridement Performed for Assessment: Wound #6 Left,Dorsal Foot Performed By: Physician [name], MD Debridement Type: Debridement Level of Consciousness (Pre-procedure): Awake and Alert Pre-procedure Verification/Time Out Yes - 11:15 Taken: Start Time: 11:15 Pain Control: Lidocaine 4% T opical Solution Percent of Wound Bed Debrided: 50% T Area Debrided (cm): otal 10.2 Tissue and other material debrided: Non-Viable, Slough, Slough Level: Non-Viable Tissue Debridement Description: Selective/Open Wound Instrument: Curette Bleeding: Minimum Hemostasis Achieved: Pressure Response to Treatment: Procedure was tolerated well Level of Consciousness (Post- Awake and Alert procedure): Post Debridement Measurements of Total Wound Length: (cm) 6.5 Width: (cm) 4 Depth: (cm) 0.1 Volume: (cm) 2.042 Character of Wound/Ulcer Post Debridement: Improved Post Procedure  Diagnosis Same as Pre-procedure Notes scribed for Dr. Lady Gary by [name], RN Electronic Signature(s) Signed: 11/20/2022 1:00:30 PM By: [name] MD FACS Signed: 11/20/2022 4:02:14 PM By: [name] Entered By: [name] on 11/20/2022 11:18:38 -------------------------------------------------------------------------------- Debridement Details Patient Name: Date of Service: [name], [facility] [name] 11/20/2022 10:45 A M Medical Record Number: [id number] Patient Account Number: [id number] [name], [name] ([id number]) [id number] Page 3 of 15 Date of Birth/Sex: Treating RN: [birth year] (82 y.o. [name] Primary Care Provider: [name], [facility] Other Clinician: Referring Provider: Treating Provider/Extender: [name] SHO [name], [facility] Weeks in Treatment: 23 Debridement Performed  for Assessment: Wound #8 Left,Lateral Foot Performed By: Physician [name], MD Debridement Type: Debridement Level of Consciousness (Pre-procedure): Awake and Alert Pre-procedure Verification/Time Out Yes - 11:15 Taken: Start Time: 11:15 Pain Control: Lidocaine 4% T opical Solution Percent of Wound Bed Debrided: 100% T Area Debrided (cm): otal 0.47 Tissue and other material debrided: Non-Viable, Slough, Slough Level: Non-Viable Tissue Debridement Description: Selective/Open Wound Instrument: Curette Bleeding: Minimum Hemostasis Achieved: Pressure Response to Treatment: Procedure was tolerated well Level of Consciousness (Post- Awake and Alert procedure): Post Debridement Measurements of Total Wound Length: (cm) 1 Stage: Category/Stage IV Width: (cm) 0.6 Depth: (cm) 0.3 Volume: (cm) 0.141 Character of Wound/Ulcer Post Debridement: Improved Post Procedure Diagnosis Same as Pre-procedure Notes scribed for Dr. Lady Gary by [name], RN Electronic Signature(s) Signed: 11/20/2022 1:00:30 PM By: [name] MD  FACS Signed: 11/20/2022 4:02:14 PM By: [name] Entered By: [name] on 11/20/2022 11:19:00 -------------------------------------------------------------------------------- HPI Details Patient Name: Date of Service: [name], [facility] [name] 11/20/2022 10:45 A M Medical Record Number: [id number] Patient Account Number: [id number] Date of Birth/Sex: Treating RN: [birth year] (82 y.o. F) Primary Care Provider: [name], [facility] Other Clinician: Referring Provider: Treating Provider/Extender: [name], [facility] Weeks in Treatment: 110 History of Present Illness HPI Description: ADMISSION 06/12/2022 This is an 82 year old woman with a history of CVA, crest syndrome, atrial fibrillation, rocker-bottom foot deformity. 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 She has been followed by podiatry for this and I am not entirely clear as to how she came to be referred here. She resides in an assisted living facility. It is not clear what they have been putting on her wound, but on intake, she was noted to have denuded skin on her medial third toe, as well as ulcers on her dorsal great toe and lateral great toe. There is slough accumulation on both of the toe ulcers. There was an odor noted at intake, but after her foot was washed, the odor dissipated. Her toes are folded on top of each other creating areas of abrasion and pockets for moisture collection, which seems to be the primary cause of her ulceration. 06/20/2022: The skin between her toes and on the ball of her foot is completely macerated. There has been more tissue breakdown. The wound on her great toe has some slough accumulation. 06/27/2022: No change to her wounds today. There has been no further deterioration, but no significant improvement. She was both hypotensive and bradycardic on intake. SAMIRIA, SUNDELL ([id number]) [id number] Page 4 of  15 07/11/2022: Today, her foot is completely macerated. She reports that the wound care nurse actually soaked her foot and then applied foam, despite our specific orders to not use foam at all. She also is draining serous fluid from both legs and has 2+ pitting edema. She is on furosemide 20 mg twice a day. 07/19/2022: Once again, her foot is completely macerated. There has been further tissue breakdown to the second and third toes and she now has ulcers on the distal ends. The wounds on her medial and lateral great toe have thick slough accumulation. Apparently Xeroform was found between her toes on intake. Edema control is improved, but still not perfect. No overt drainage from her legs appreciated on exam today. 07/27/2022: She has less tissue maceration today. Edema control in her bilateral lower extremities is improved. Still with slough accumulation on all open wound surfaces. 08/08/2022: Significant improvement this week. She has very little tissue  maceration and according to her aide, there has been very little drainage from her legs. There is some slough accumulation on the medial great toe ulcer. Edema control is improved bilaterally. She is spending more time in her bed with her legs elevated and less in her wheelchair with her legs in a dependent position. 08/20/2022: The edema in her legs is now well-controlled with the use of the zinc Unna boots. Unfortunately, this seems to have resulted in more drainage coming from the open areas of her feet. They are a bit macerated but there has not been as much tissue breakdown secondary to moisture as we have seen on previous visits. 08/28/2022: The only remaining open wound in her foot is on the dorsal great toe. The improvement in the rest of the foot is quite dramatic, with no tissue maceration or breakdown. She has been elevating her legs and wearing compression wraps. Edema control is excellent and there has been no drainage from  her legs. 09/05/2022: Unfortunately, the distal half of her dorsal foot has broken down and has a layer of slough on the surface. This appears to be secondary to moisture accumulation. Her dressing was completely saturated. 09/20/2022: There has been further deterioration of her foot. The dressing that was applied was bizarre and involved Coflex foam wrapped around her foot to the ankle and then Kerlix and Coban over that to the knee. She fortunately did have silver alginate between her toes but the tissue breakdown from moisture is extensive. 09/25/2022: There has been massive improvement in her foot since last week. The maceration has decreased, edema control is markedly better, and the wounds are showing evidence of healing, as opposed to worsening. 10/03/2022: The wound on her great toe is much smaller with minimal slough accumulation. The dorsal foot is also improving. She has a new ulcer on the plantar surface of her left fourth toe, however, and bone is exposed. Edema control and tissue maceration continues to be significantly better now that we are doing all of the dressing care. 10/09/2022: Unfortunately, it seems that the patient has resumed her habit of sitting in her wheelchair with her legs in a dependent position and there has been a lot more drainage and maceration on her foot. The dorsal foot wounds have expanded and are deeper. She has a new wound on her second toe and although the initial wound on her great toe has healed, she has a new wound on the lateral aspect of her great toe, immediately adjacent to that on her second toe, suggesting these have been caused by friction of the 2 toes rubbing against each other. Her son is participating in this visit via speaker phone. 10/24/2022: Last week she had a nurse visit due to clinic capacity and it appears that her dressing was done differently by the nurse that saw her then how it is usually performed by her regular nurse. Unfortunately, this  meant there was more moisture-related tissue breakdown. The dorsum of her foot is open again and she has a new wound on the lateral aspect of her fifth toe. There is slough accumulation in each of the sites. 11/01/2022: There has been remarkable turnaround since her last visit. All of the wounds are smaller. The wound on the dorsal aspect of her great toe has closed completely. The wounds on the top of her foot have contracted and are starting to epithelialize. The new wound on the lateral aspect of her fifth toe does have some tendon exposure today that  was not appreciated at her last visit. There is more tissue over the exposed bone on the plantar surface of her fourth toe, although bone does still remain exposed. 11/08/2022: All of her wounds are looking better again this week. The exposed bone on the plantar surface of the fourth toe has now been covered with soft tissue. The dorsal foot wounds are epithelializing nicely. The lateral fifth toe wound is smaller. There is slough on all of the surfaces. She had ABIs done in the vascular lab last week. Results are copied here: ABI Findings: +---------+------------------+-----+----------+--------+ Right Rt Pressure (mmHg)IndexWaveform Comment  +---------+------------------+-----+----------+--------+ Brachial 132     +---------+------------------+-----+----------+--------+ PTA 108 0.82 monophasic  +---------+------------------+-----+----------+--------+ DP 81 0.61 monophasic  +---------+------------------+-----+----------+--------+ Great T oe24 0.18 Abnormal   +---------+------------------+-----+----------+--------+ +---------+------------------+-----+----------+--------------------------------+ Left Lt Pressure (mmHg)IndexWaveform Comment  +---------+------------------+-----+----------+--------------------------------+ Brachial 121      +---------+------------------+-----+----------+--------------------------------+ PTA 88 0.67 monophasic  +---------+------------------+-----+----------+--------------------------------+ DP 111 0.84 monophasic  +---------+------------------+-----+----------+--------------------------------+ Great T   unable to obtain due to  oe     bandaging/ wound  +---------+------------------+-----+----------+--------------------------------+ +-------+-----------+-----------+------------+------------+ ABI/TBIT oday's ABIT oday's TBIPrevious ABIPrevious TBI +-------+-----------+-----------+------------+------------+ Right 0.82 0.18    +-------+-----------+-----------+------------+------------+ Left 0.84     +-------+-----------+-----------+------------+------------+ SummaryMarsh Joseph ([id number]) [id number] Page 5 of 15 Right: Resting right ankle-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 The right toe-brachial index is abnormal. Left: Resting left ankle-brachial index indicates mild left lower extremity arterial disease. Unable to obtain TBI due to bandaging/wound. *See table(s) above for measurements and observations. 11/16/2022: All of her wounds are improving. The wound on the plantar surface of the fourth toe has good tissue covering the bone. The dorsal foot areas are epithelializing. The lateral foot ulcer still has muscle exposed; this is probably the most severe of her wounds at this time. She is scheduled to see vascular surgery on August 20. 11/20/2022: The wound on the plantar surface of the fourth toe seems to be closed. The dorsal foot areas are smaller. The lateral foot ulcer has muscle and tendon exposed. The medial great toe ulcer has bone exposed and the lateral great toe ulcer just has some slough on the surface. Unfortunately, the dorsal second toe ulcer now also has tendon  exposure. Electronic Signature(s) Signed: 11/20/2022 11:39:42 AM By: [name] MD FACS Entered By: [name] on 11/20/2022 11:39:42 -------------------------------------------------------------------------------- Physical Exam Details Patient Name: Date of Service: [name] [facility] [name] 11/20/2022 10:45 A M Medical Record Number: [id number] Patient Account Number: [id number] Date of Birth/Sex: Treating RN: [birth year] (82 y.o. F) Primary Care Provider: [name], [facility] Other Clinician: Referring Provider: Treating Provider/Extender: [name] SHO KES, [facility] Weeks in Treatment: 23 Constitutional . . . . no acute distress. Respiratory Normal work of breathing on room air. Notes 11/20/2022: The wound on the plantar surface of the fourth toe seems to be closed. The dorsal foot areas are smaller. The lateral foot ulcer has muscle and tendon exposed. The medial great toe ulcer has bone exposed and the lateral great toe ulcer just has some slough on the surface. Unfortunately, the dorsal second toe ulcer now also has tendon exposure. Electronic Signature(s) Signed: 11/20/2022 11:41:02 AM By: [name] MD FACS Entered By: [name] on 11/20/2022 11:41:01 -------------------------------------------------------------------------------- Physician Orders Details Patient Name: Date of Service: [name] [facility] [name] 11/20/2022 10:45 A M Medical Record Number: [id number] Patient Account Number: [id number] Date of Birth/Sex: Treating RN: March 01, 1941 (82 y.o. 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 - Dr. Lady Gary - room 2 DO NOT CHANGE DRESSING - Return A Anesthetic (In clinic) Topical Lidocaine 4% applied to wound bed - USED in Clinic Bathing/ Shower/ Hygiene May shower with protection but do not get wound dressing(s) wet. Protect dressing(s) with water repellant cover (for example, large plastic bag) or a cast cover and may then take shower. Edema Control - Lymphedema / SCD / Other Elevate legs to the level of the heart or above for 30 minutes daily and/or when sitting for 3-4 times a day throughout the day. Avoid standing for long periods of time. Non Wound Condition Right Lower Extremity Other Non Wound Condition Orders/Instructions: - urgo lite or 3 layer compression wrap on right and left lower leg Wound Treatment Wound #6 - Foot Wound Laterality: Dorsal, Left Cleanser: Soap and Water 1 x Per Week/30 Days Discharge Instructions: May shower and wash wound with dial antibacterial soap and water prior to dressing change. Cleanser: Wound Cleanser 1 x Per Week/30 Days Discharge Instructions: Cleanse the wound with wound cleanser prior to applying a clean dressing using gauze sponges, not tissue or cotton balls. Peri-Wound Care: Sween Lotion (Moisturizing lotion) 1 x Per Week/30 Days Discharge Instructions: Apply moisturizing lotion as directed Topical: Gentamicin 1 x Per Week/30 Days Discharge Instructions: As directed by  physician Topical: Mupirocin Ointment 1 x Per Week/30 Days Discharge Instructions: Apply Mupirocin (Bactroban) as instructed Topical: Ketoconazole Cream 2% 1 x Per Week/30 Days Discharge Instructions: Apply Ketoconazole as directed Topical: Triamcinolone 1 x Per Week/30 Days Discharge Instructions: Apply Triamcinolone as directed Topical: zinc 1 x Per Week/30 Days Prim Dressing: Endoform 2x2 in 1 x Per Week/30 Days ary Discharge Instructions: TO THE 2ND TOE AREA OF WOUND - Moisten with saline Prim Dressing: Maxorb Extra Ag+ Alginate Dressing, 4x4.75 (in/in) 1 x Per Week/30 Days ary Discharge Instructions: Apply to wound bed as instructed Secondary Dressing: ABD Pad, 8x10 1 x Per Week/30 Days Discharge Instructions: Apply over primary dressing as directed. Secondary Dressing: Drawtex 4x4 in 1 x Per Week/30 Days Discharge Instructions: Apply over primary dressing as directed. Secondary Dressing: Woven Gauze Sponge, Non-Sterile 4x4 in 1 x Per Week/30 Days Discharge Instructions: can use gauze between toes (if no desired) Secondary Dressing: Zetuvit Plus 4x8 in 1 x Per Week/30 Days Discharge Instructions: Apply over primary dressing as directed. Compression Wrap: Urgo K2 Lite, (equivalent to a 3 layer) two layer compression system, regular 1 x Per Week/30 Days Discharge Instructions: Apply Urgo K2 Lite as directed (alternative to 3 layer compression). Wound #8 - Foot Wound Laterality: Left, Lateral Cleanser: Soap and Water 1 x Per Week/30 Days Discharge Instructions: May shower and wash wound with dial antibacterial soap and water prior to dressing change. [name], [name] ([id number]) [id number] Page 7 of 15 Cleanser: Wound Cleanser 1 x Per Week/30 Days Discharge Instructions: Cleanse the  wound with wound cleanser prior to applying a clean dressing using gauze sponges, not tissue or cotton balls. Peri-Wound Care: Sween Lotion (Moisturizing lotion) 1 x Per  Week/30 Days Discharge Instructions: Apply moisturizing lotion as directed Topical: Gentamicin 1 x Per Week/30 Days Discharge Instructions: As directed by physician Topical: Mupirocin Ointment 1 x Per Week/30 Days Discharge Instructions: Apply Mupirocin (Bactroban) as instructed Topical: Ketoconazole Cream 2% 1 x Per Week/30 Days Discharge Instructions: Apply Ketoconazole as directed Topical: Triamcinolone 1 x Per Week/30 Days Discharge Instructions: Apply Triamcinolone as directed Topical: zinc 1 x Per Week/30 Days Prim Dressing: Endoform 2x2 in 1 x Per Week/30 Days ary Discharge Instructions: Moisten with saline Secondary Dressing: ABD Pad, 8x10 1 x Per Week/30 Days Discharge Instructions: Apply over primary dressing as directed. Secondary Dressing: Drawtex 4x4 in 1 x Per Week/30 Days Discharge Instructions: Apply over primary dressing as directed. Secondary Dressing: Woven Gauze Sponge, Non-Sterile 4x4 in 1 x Per Week/30 Days Discharge Instructions: can use gauze between toes (if no desired) Secondary Dressing: Zetuvit Plus 4x8 in 1 x Per Week/30 Days Discharge Instructions: Apply over primary dressing as directed. Compression Wrap: Urgo K2 Lite, (equivalent to a 3 layer) two layer compression system, regular 1 x Per Week/30 Days Discharge Instructions: Apply Urgo K2 Lite as directed (alternative to 3 layer compression). Wound #9 - T Great oe Wound Laterality: Left, Lateral Cleanser: Soap and Water 1 x Per Week/30 Days Discharge Instructions: May shower and wash wound with dial antibacterial soap and water prior to dressing change. Cleanser: Wound Cleanser 1 x Per Week/30 Days Discharge Instructions: Cleanse the wound with wound cleanser prior to applying a clean dressing using gauze sponges, not tissue or cotton balls. Peri-Wound Care: Sween Lotion (Moisturizing lotion) 1 x Per Week/30 Days Discharge Instructions: Apply moisturizing lotion as directed Topical: Gentamicin 1 x Per  Week/30 Days Discharge Instructions: As directed by physician Topical: Mupirocin Ointment 1 x Per Week/30 Days Discharge Instructions: Apply Mupirocin (Bactroban) as instructed Topical: Ketoconazole Cream 2% 1 x Per Week/30 Days Discharge Instructions: Apply Ketoconazole as directed Topical: Triamcinolone 1 x Per Week/30 Days Discharge Instructions: Apply Triamcinolone as directed Topical: zinc 1 x Per Week/30 Days Prim Dressing: Endoform 2x2 in 1 x Per Week/30 Days ary Discharge Instructions: Moisten with saline Secondary Dressing: ABD Pad, 8x10 1 x Per Week/30 Days Discharge Instructions: Apply over primary dressing as directed. Secondary Dressing: Drawtex 4x4 in 1 x Per Week/30 Days Discharge Instructions: Apply over primary dressing as directed. Secondary Dressing: Woven Gauze Sponge, Non-Sterile 4x4 in 1 x Per Week/30 Days Discharge Instructions: can use gauze between toes (if no desired) Secondary Dressing: Zetuvit Plus 4x8 in 1 x Per Week/30 Days Discharge Instructions: Apply over primary dressing as directed. Compression Wrap: Urgo K2 Lite, (equivalent to a 3 layer) two layer compression system, regular 1 x Per Week/30 Days Burkhammer, Chondra ([id number]) [phone].pdf Page 8 of 15 Discharge Instructions: Apply Urgo K2 Lite as directed (alternative to 3 layer compression). Patient Medications llergies: Sulfa (Sulfonamide Antibiotics) A Notifications Medication Indication Start End 11/20/2022 lidocaine DOSE topical 4 % cream - cream topical Electronic Signature(s) Signed: 11/20/2022 1:00:30 PM By: [name] MD FACS Entered By: [name] on 11/20/2022 11:41:32 -------------------------------------------------------------------------------- Problem List Details Patient Name: Date of Service: [name] [facility] [name] 11/20/2022 10:45 A M Medical Record Number: [id number] Patient Account Number: [id number] Date of Birth/Sex: Treating RN: Sep 02, 1940  (82 y.o. 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 11/01/2022 No Yes without evidence of necrosis 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 06/12/2022 No Yes I10 Essential (primary) hypertension 06/12/2022 No Yes 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 06/12/2022 No Yes 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 atrial fibrillation 06/12/2022 No Yes Z79.01 Long term (current) use of anticoagulants 06/12/2022 No Yes I89.0 Lymphedema, not elsewhere classified 08/20/2022 No Yes [name], [name] ([id number]) [phone].pdf Page 9 of 15 Inactive Problems ICD-10 Code Description Active Date Inactive Date L97.521 Non-pressure chronic ulcer of other part of left foot limited to breakdown of skin 06/12/2022 06/12/2022 Resolved Problems Electronic Signature(s) Signed: 11/20/2022 11:37:43 AM By: [name] MD FACS Entered By: [name] on 11/20/2022 11:37:42 -------------------------------------------------------------------------------- Progress Note Details Patient Name: Date of Service: [name] [facility] [name] 11/20/2022 10:45 A M Medical Record Number: [id number] Patient Account Number: [id number] Date of Birth/Sex: Treating RN: [birth year] (82 y.o. F) Primary Care Provider: [name], [facility] Other Clinician: Referring Provider: Treating Provider/Extender: [name], [facility] Weeks in Treatment: 85 Subjective Chief Complaint Information obtained from  Patient Patient seen for complaints of Non-Healing Wound. History of Present Illness (HPI) ADMISSION 06/12/2022 This is an 82 year old woman with a history of CVA, crest syndrome, atrial fibrillation, rocker-bottom foot deformity. 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 She has been followed by podiatry for this and I am not entirely clear as to how she came to be referred here. She resides in an assisted living facility. It is not clear what they have been putting on her wound, but on intake, she was noted to have denuded skin on her medial third toe, as well as ulcers on her dorsal great toe and lateral great toe. There is slough accumulation on both of the toe ulcers. There was an odor noted at intake, but after her foot was washed, the odor dissipated. Her toes are folded on top of each other creating areas of abrasion and pockets for moisture collection, which seems to be the primary cause of her ulceration. 06/20/2022: The skin between her toes and on the ball of her foot is completely macerated. There has been more tissue breakdown. The wound on her great toe has some slough accumulation. 06/27/2022: No change to her wounds today. There has been no further deterioration, but no significant improvement. She was both hypotensive and bradycardic on intake. 07/11/2022: Today, her foot is completely macerated. She reports that the wound care nurse actually soaked her foot and then applied foam, despite our specific orders to not use foam at all. She also is draining serous fluid from both legs and has 2+ pitting edema. She is on furosemide 20 mg twice a day. 07/19/2022: Once again, her foot is completely macerated. There has been further tissue breakdown to the second and third toes and she now has ulcers on the distal ends. The wounds on her medial and lateral great toe have thick slough accumulation. Apparently Xeroform was found between her toes on  intake. Edema control is improved, but still not perfect. No overt drainage from her legs appreciated on exam today. 07/27/2022: She has  less tissue maceration today. Edema control in her bilateral lower extremities is improved. Still with slough accumulation on all open wound surfaces. 08/08/2022: Significant improvement this week. She has very little tissue maceration and according to her aide, there has been very little drainage from her legs. There is some slough accumulation on the medial great toe ulcer. Edema control is improved bilaterally. She is spending more time in her bed with her legs elevated and less in her wheelchair with her legs in a dependent position. 08/20/2022: The edema in her legs is now well-controlled with the use of the zinc Unna boots. Unfortunately, this seems to have resulted in more drainage coming from the open areas of her feet. They are a bit macerated but there has not been as much tissue breakdown secondary to moisture as we have seen on previous visits. 08/28/2022: The only remaining open wound in her foot is on the dorsal great toe. The improvement in the rest of the foot is quite dramatic, with no tissue maceration or breakdown. She has been elevating her legs and wearing compression wraps. Edema control is excellent and there has been no drainage from her legs. 09/05/2022: Unfortunately, the distal half of her dorsal foot has broken down and has a layer of slough on the surface. This appears to be secondary to moisture accumulation. Her dressing was completely saturated. [name], [name] ([id number]) [id number] Page 10 of 15 09/20/2022: There has been further deterioration of her foot. The dressing that was applied was bizarre and involved Coflex foam wrapped around her foot to the ankle and then Kerlix and Coban over that to the knee. She fortunately did have silver alginate between her toes but the tissue breakdown from moisture  is extensive. 09/25/2022: There has been massive improvement in her foot since last week. The maceration has decreased, edema control is markedly better, and the wounds are showing evidence of healing, as opposed to worsening. 10/03/2022: The wound on her great toe is much smaller with minimal slough accumulation. The dorsal foot is also improving. She has a new ulcer on the plantar surface of her left fourth toe, however, and bone is exposed. Edema control and tissue maceration continues to be significantly better now that we are doing all of the dressing care. 10/09/2022: Unfortunately, it seems that the patient has resumed her habit of sitting in her wheelchair with her legs in a dependent position and there has been a lot more drainage and maceration on her foot. The dorsal foot wounds have expanded and are deeper. She has a new wound on her second toe and although the initial wound on her great toe has healed, she has a new wound on the lateral aspect of her great toe, immediately adjacent to that on her second toe, suggesting these have been caused by friction of the 2 toes rubbing against each other. Her son is participating in this visit via speaker phone. 10/24/2022: Last week she had a nurse visit due to clinic capacity and it appears that her dressing was done differently by the nurse that saw her then how it is usually performed by her regular nurse. Unfortunately, this meant there was more moisture-related tissue breakdown. The dorsum of her foot is open again and she has a new wound on the lateral aspect of her fifth toe. There is slough accumulation in each of the sites. 11/01/2022: There has been remarkable turnaround since her last visit. All of the wounds are smaller. The wound on the dorsal aspect of  her great toe has closed completely. The wounds on the top of her foot have contracted and are starting to epithelialize. The new wound on the lateral aspect of her fifth toe does have some  tendon exposure today that was not appreciated at her last visit. There is more tissue over the exposed bone on the plantar surface of her fourth toe, although bone does still remain exposed. 11/08/2022: All of her wounds are looking better again this week. The exposed bone on the plantar surface of the fourth toe has now been covered with soft tissue. The dorsal foot wounds are epithelializing nicely. The lateral fifth toe wound is smaller. There is slough on all of the surfaces. She had ABIs done in the vascular lab last week. Results are copied here: ABI Findings: +---------+------------------+-----+----------+--------+ Right Rt Pressure (mmHg)IndexWaveform Comment  +---------+------------------+-----+----------+--------+ Brachial 132     +---------+------------------+-----+----------+--------+ PTA 108 0.82 monophasic  +---------+------------------+-----+----------+--------+ DP 81 0.61 monophasic  +---------+------------------+-----+----------+--------+ Great T oe24 0.18 Abnormal   +---------+------------------+-----+----------+--------+ +---------+------------------+-----+----------+--------------------------------+ Left Lt Pressure (mmHg)IndexWaveform Comment  +---------+------------------+-----+----------+--------------------------------+ Brachial 121     +---------+------------------+-----+----------+--------------------------------+ PTA 88 0.67 monophasic  +---------+------------------+-----+----------+--------------------------------+ DP 111 0.84 monophasic  +---------+------------------+-----+----------+--------------------------------+ Great T   unable to obtain due to  oe     bandaging/ wound  +---------+------------------+-----+----------+--------------------------------+ +-------+-----------+-----------+------------+------------+ ABI/TBIT oday's ABIT oday's TBIPrevious ABIPrevious  TBI +-------+-----------+-----------+------------+------------+ Right 0.82 0.18    +-------+-----------+-----------+------------+------------+ Left 0.84     +-------+-----------+-----------+------------+------------+ Summary: Right: Resting right ankle-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 The right toe-brachial index is abnormal. Left: Resting left ankle-brachial index indicates mild left lower extremity arterial disease. Unable to obtain TBI due to bandaging/wound. *See table(s) above for measurements and observations. 11/16/2022: All of her wounds are improving. The wound on the plantar surface of the fourth toe has good tissue covering the bone. The dorsal foot areas are epithelializing. The lateral foot ulcer still has muscle exposed; this is probably the most severe of her wounds at this time. She is scheduled to see vascular surgery on August 20. 11/20/2022: The wound on the plantar surface of the fourth toe seems to be closed. The dorsal foot areas are smaller. The lateral foot ulcer has muscle and tendon exposed. The medial great toe ulcer has bone exposed and the lateral great toe ulcer just has some slough on the surface. Unfortunately, the dorsal second toe ulcer now also has tendon exposure. Patient History Information obtained from Patient. Family History Unknown History. Social History Former smoker - smoked when she was young, Marital Status - Widowed, Alcohol Use - Never, Drug Use - No History, Caffeine Use - Daily. CANDECE, RINER ([id number]) [id number] Page 11 of 15 Medical History Hematologic/Lymphatic Patient has history of Anemia Cardiovascular Patient has history of Angina - a-fib, Hypertension, Vasculitis Endocrine Denies history of Type I Diabetes, Type II Diabetes Musculoskeletal Patient has history of Osteoarthritis Hospitalization/Surgery History - cholecystectomy. - leg surgery. -  tonsillectomy. 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 Vitals Time Taken: 10:50 AM, Height: 67 in, Weight: 153 lbs, BMI: 24, Temperature: 97.8 F, Pulse: 80 bpm, Respiratory Rate: 18 breaths/min, Blood Pressure: 112/72 mmHg. Respiratory Normal work of breathing on room air. General Notes: 11/20/2022: The wound on the plantar surface of the fourth toe seems to be closed. The dorsal foot areas are smaller. The lateral foot ulcer has muscle and tendon exposed. The medial great toe ulcer has bone exposed and the lateral great toe ulcer just  has some slough on the surface. Unfortunately, the dorsal second toe ulcer now also has tendon exposure. Integumentary (Hair, Skin) Wound #6 status is Open. Original cause of wound was Gradually Appeared. The date acquired was: 09/05/2022. The wound has been in treatment 10 weeks. The wound is located on the Left,Dorsal Foot. The wound measures 6.5cm length x 4cm width x 0.1cm depth; 20.42cm^2 area and 2.042cm^3 volume. There is tendon and Fat Layer (Subcutaneous Tissue) exposed. There is no tunneling or undermining noted. There is a medium amount of serosanguineous drainage noted. The wound margin is indistinct and nonvisible. There is large (67-100%) pink granulation within the wound bed. There is a small (1-33%) amount of necrotic tissue within the wound bed including Adherent Slough. The periwound skin appearance had no abnormalities noted for texture. The periwound skin appearance had no abnormalities noted for moisture. The periwound skin appearance exhibited: Rubor. Periwound temperature was noted as Cool/Cold. The periwound has tenderness on palpation. Wound #7 status is Open. Original cause of wound was Gradually Appeared. The date acquired was: 10/03/2022. The wound has been in treatment 6 weeks.  The wound is located on the Left,Plantar T Fourth. The wound measures 0cm length x 0cm width x 0cm depth; 0cm^2 area and 0cm^3 volume. There is no oe tunneling or undermining noted. There is a none present amount of drainage noted. The wound margin is flat and intact. There is no granulation within the wound bed. There is no necrotic tissue within the wound bed. The periwound skin appearance had no abnormalities noted for texture. The periwound skin appearance had no abnormalities noted for moisture. The periwound skin appearance exhibited: Rubor. Periwound temperature was noted as Cool/Cold. Wound #8 status is Open. Original cause of wound was Pressure Injury. The date acquired was: 10/24/2022. The wound has been in treatment 3 weeks. The wound is located on the Left,Lateral Foot. The wound measures 1cm length x 0.6cm width x 0.3cm depth; 0.471cm^2 area and 0.141cm^3 volume. There is muscle, tendon, and Fat Layer (Subcutaneous Tissue) exposed. There is no tunneling or undermining noted. There is a medium amount of serosanguineous drainage noted. The wound margin is distinct with the outline attached to the wound base. There is small (1-33%) red granulation within the wound bed. There is a large (67-100%) amount of necrotic tissue within the wound bed including Adherent Slough and Necrosis of Muscle. The periwound skin appearance had no abnormalities noted for texture. The periwound skin appearance had no abnormalities noted for moisture. The periwound skin appearance had no abnormalities noted for color. Periwound temperature was noted as No Abnormality. The periwound has tenderness on palpation. Wound #9 status is Open. Original cause of wound was Gradually Appeared. The date acquired was: 11/01/2022. The wound has been in treatment 2 weeks. The wound is located on the [employer]. The wound measures 0.2cm length x 0.3cm width x 0.1cm depth; 0.047cm^2 area and 0.005cm^3 volume. There oe is  Fat Layer (Subcutaneous Tissue) exposed. There is a small amount of serous drainage noted. The wound margin is flat and intact. There is large (67-100%) pink granulation within the wound bed. There is a small (1-33%) amount of necrotic tissue within the wound bed including Adherent Slough. The periwound skin appearance had no abnormalities noted for texture. The periwound skin appearance had no abnormalities noted for moisture. The periwound skin appearance exhibited: Rubor. Periwound temperature was noted as Cool/Cold. The periwound has tenderness on palpation. 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 There was a Selective/Open Wound Non-Viable Tissue Debridement with a total area of 10.2 sq cm performed by [name], MD. With the following instrument(s): Curette to remove Non-Viable tissue/material. Material removed includes [facility] after achieving pain control using Lidocaine 4% Topical Solution. No specimens were taken. A time out was conducted at 11:15, prior to the start of the procedure. A Minimum amount of bleeding was controlled with Pressure. The procedure was tolerated well. Post Debridement Measurements: 6.5cm length x 4cm width x 0.1cm depth; 2.042cm^3 volume. Character of  Wound/Ulcer Post Debridement is improved. Post procedure Diagnosis Wound #6: Same as Pre-Procedure General Notes: scribed for Dr. Lady Gary by [name], RN. Wound #8 Pre-procedure diagnosis of Wound #8 is a Pressure Ulcer located on the Left,Lateral Foot . There was a Selective/Open Wound Non-Viable Tissue Debridement with a total area of 0.47 sq cm performed by [name], MD. With the following instrument(s): Curette to remove Non-Viable tissue/material. Material removed includes [facility] after achieving pain control using Lidocaine 4% Topical Solution. No specimens were taken. A time out was conducted at 11:15, prior to the start of the procedure. A Minimum amount of bleeding was controlled with Pressure. The procedure was tolerated well. Post Debridement Measurements: 1cm length x 0.6cm width x 0.3cm depth; 0.141cm^3 volume. Post debridement Stage noted as Category/Stage IV. Character of Wound/Ulcer Post Debridement is improved. Post procedure Diagnosis Wound #8: Same as Pre-Procedure General Notes: scribed for Dr. Lady Gary by [name], RN. 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 There was a Double Layer Compression Therapy Procedure by [name], RN. 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 There was a Selective/Open Wound Non-Viable Tissue oe Debridement with a total area of 0.05 sq cm performed by [name], MD. With the following instrument(s): Curette to remove Non-Viable tissue/material. Material removed includes [facility] after achieving pain control using Lidocaine 4% Topical Solution. No specimens were taken. A time out was conducted at 11:15, prior to the start of the procedure. A Minimum amount of bleeding was controlled with Pressure. The procedure was tolerated well. Post Debridement  Measurements: 0.2cm length x 0.3cm width x 0.1cm depth; 0.005cm^3 volume. Character of Wound/Ulcer Post Debridement is improved. Post procedure Diagnosis Wound #9: Same as Pre-Procedure General Notes: scribed for Dr. Lady Gary by [name], RN. Plan Follow-up Appointments: Return Appointment in 1 week. - Dr. Lady Gary - room 2 DO NOT CHANGE DRESSING - Anesthetic: (In clinic) Topical Lidocaine 4% applied to wound bed - USED in Clinic Bathing/ Shower/ Hygiene: May shower with protection but do not get wound dressing(s) wet. Protect dressing(s) with water repellant cover (for example, large plastic bag) or a cast cover and may then take shower. Edema Control - Lymphedema / SCD / Other: Elevate legs to the level of the heart or above for 30 minutes daily and/or  when sitting for 3-4 times a day throughout the day. Avoid standing for long periods of time. Non Wound Condition: Other Non Wound Condition Orders/Instructions: - urgo lite or 3 layer compression wrap on right and left lower leg The following medication(s) was prescribed: lidocaine topical 4 % cream cream topical was prescribed at facility WOUND #6: - Foot Wound Laterality: Dorsal, Left Cleanser: Soap and Water 1 x Per Week/30 Days Discharge Instructions: May shower and wash wound with dial antibacterial soap and water prior to dressing change. Cleanser: Wound Cleanser 1 x Per Week/30 Days Discharge Instructions: Cleanse the wound with wound cleanser prior to applying a clean dressing using gauze sponges, not tissue or cotton balls. Peri-Wound Care: Sween Lotion (Moisturizing lotion) 1 x Per Week/30 Days Discharge Instructions: Apply moisturizing lotion as directed Topical: Gentamicin 1 x Per Week/30 Days Discharge Instructions: As directed by physician Topical: Mupirocin Ointment 1 x Per Week/30 Days Discharge Instructions: Apply Mupirocin (Bactroban) as instructed Topical: Ketoconazole Cream 2% 1 x Per Week/30 Days Discharge  Instructions: Apply Ketoconazole as directed Topical: Triamcinolone 1 x Per Week/30 Days Discharge Instructions: Apply Triamcinolone as directed Topical: zinc 1 x Per Week/30 Days Prim Dressing: Endoform 2x2 in 1 x Per Week/30 Days ary Discharge Instructions: TO THE 2ND TOE AREA OF WOUND - Moisten with saline Prim Dressing: Maxorb Extra Ag+ Alginate Dressing, 4x4.75 (in/in) 1 x Per Week/30 Days ary Discharge Instructions: Apply to wound bed as instructed Secondary Dressing: ABD Pad, 8x10 1 x Per Week/30 Days [name], [name] ([id number]) [id number] Page 13 of 15 Discharge Instructions: Apply over primary dressing as directed. Secondary Dressing: Drawtex 4x4 in 1 x Per Week/30 Days Discharge Instructions: Apply over primary dressing as directed. Secondary Dressing: Woven Gauze Sponge, Non-Sterile 4x4 in 1 x Per Week/30 Days Discharge Instructions: can use gauze between toes (if no desired) Secondary Dressing: Zetuvit Plus 4x8 in 1 x Per Week/30 Days Discharge Instructions: Apply over primary dressing as directed. Com pression Wrap: Urgo K2 Lite, (equivalent to a 3 layer) two layer compression system, regular 1 x Per Week/30 Days Discharge Instructions: Apply Urgo K2 Lite as directed (alternative to 3 layer compression). WOUND #8: - Foot Wound Laterality: Left, Lateral Cleanser: Soap and Water 1 x Per Week/30 Days Discharge Instructions: May shower and wash wound with dial antibacterial soap and water prior to dressing change. Cleanser: Wound Cleanser 1 x Per Week/30 Days Discharge Instructions: Cleanse the wound with wound cleanser prior to applying a clean dressing using gauze sponges, not tissue or cotton balls. Peri-Wound Care: Sween Lotion (Moisturizing lotion) 1 x Per Week/30 Days Discharge Instructions: Apply moisturizing lotion as directed Topical: Gentamicin 1 x Per Week/30 Days Discharge Instructions: As directed by physician Topical: Mupirocin  Ointment 1 x Per Week/30 Days Discharge Instructions: Apply Mupirocin (Bactroban) as instructed Topical: Ketoconazole Cream 2% 1 x Per Week/30 Days Discharge Instructions: Apply Ketoconazole as directed Topical: Triamcinolone 1 x Per Week/30 Days Discharge Instructions: Apply Triamcinolone as directed Topical: zinc 1 x Per Week/30 Days Prim Dressing: Endoform 2x2 in 1 x Per Week/30 Days ary Discharge Instructions: Moisten with saline Secondary Dressing: ABD Pad, 8x10 1 x Per Week/30 Days Discharge Instructions: Apply over primary dressing as directed. Secondary Dressing: Drawtex 4x4 in 1 x Per Week/30 Days Discharge Instructions: Apply over primary dressing as directed. Secondary Dressing: Woven Gauze Sponge, Non-Sterile 4x4 in 1 x Per Week/30 Days Discharge Instructions: can use gauze between toes (if no desired) Secondary Dressing: Zetuvit Plus 4x8 in 1 x Per Week/30 Days  Discharge Instructions: Apply over primary dressing as directed. Com pression Wrap: Urgo K2 Lite, (equivalent to a 3 layer) two layer compression system, regular 1 x Per Week/30 Days Discharge Instructions: Apply Urgo K2 Lite as directed (alternative to 3 layer compression). WOUND #9: - T Great Wound Laterality: Left, Lateral oe Cleanser: Soap and Water 1 x Per Week/30 Days Discharge Instructions: May shower and wash wound with dial antibacterial soap and water prior to dressing change. Cleanser: Wound Cleanser 1 x Per Week/30 Days Discharge Instructions: Cleanse the wound with wound cleanser prior to applying a clean dressing using gauze sponges, not tissue or cotton balls. Peri-Wound Care: Sween Lotion (Moisturizing lotion) 1 x Per Week/30 Days Discharge Instructions: Apply moisturizing lotion as directed Topical: Gentamicin 1 x Per Week/30 Days Discharge Instructions: As directed by physician Topical: Mupirocin Ointment 1 x Per Week/30 Days Discharge Instructions: Apply Mupirocin (Bactroban) as  instructed Topical: Ketoconazole Cream 2% 1 x Per Week/30 Days Discharge Instructions: Apply Ketoconazole as directed Topical: Triamcinolone 1 x Per Week/30 Days Discharge Instructions: Apply Triamcinolone as directed Topical: zinc 1 x Per Week/30 Days Prim Dressing: Endoform 2x2 in 1 x Per Week/30 Days ary Discharge Instructions: Moisten with saline Secondary Dressing: ABD Pad, 8x10 1 x Per Week/30 Days Discharge Instructions: Apply over primary dressing as directed. Secondary Dressing: Drawtex 4x4 in 1 x Per Week/30 Days Discharge Instructions: Apply over primary dressing as directed. Secondary Dressing: Woven Gauze Sponge, Non-Sterile 4x4 in 1 x Per Week/30 Days Discharge Instructions: can use gauze between toes (if no desired) Secondary Dressing: Zetuvit Plus 4x8 in 1 x Per Week/30 Days Discharge Instructions: Apply over primary dressing as directed. Com pression Wrap: Urgo K2 Lite, (equivalent to a 3 layer) two layer compression system, regular 1 x Per Week/30 Days Discharge Instructions: Apply Urgo K2 Lite as directed (alternative to 3 layer compression). 11/20/2022: The wound on the plantar surface of the fourth toe seems to be closed. The dorsal foot areas are smaller. The lateral foot ulcer has muscle and tendon exposed. The medial great toe ulcer has bone exposed and the lateral great toe ulcer just has some slough on the surface. Unfortunately, the dorsal second toe ulcer now also has tendon exposure. I used a curette to debride slough from all of the open wound areas. We will apply endoform to the wounds that have failed or tendon exposed with silver alginate to the others. Continue the mixture of topical gentamicin, mupirocin, and ketoconazole to all surfaces with Urgo lite wrap. I am hopeful that vascular surgery will be able to offer some intervention to improve the blood flow to this foot. She sees them on August 20. Follow-up with me in 1 week. Electronic  Signature(s) Signed: 11/20/2022 11:42:35 AM By: [name] MD FACS Entered By: [name] on 11/20/2022 11:42:34 [name], [name] ([id number]) [id number] Page 14 of 15 -------------------------------------------------------------------------------- HxROS Details Patient Name: Date of Service: [name] 11/20/2022 10:45 A M Medical Record Number: [id number] Patient Account Number: [id number] Date of Birth/Sex: Treating RN: [birth year] (82 y.o. 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 Endocrine Medical History: Negative for: Type I Diabetes; Type II Diabetes Past Medical History Notes: hypothyroidism Musculoskeletal Medical History: Positive for: 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 Neurologic  Medical History: Past Medical History Notes: stroke Immunizations Pneumococcal Vaccine: Received Pneumococcal Vaccination: Yes Received Pneumococcal Vaccination On or After 60th Birthday: Yes Implantable Devices None Hospitalization / Surgery History Type of Hospitalization/Surgery cholecystectomy leg surgery tonsillectomy Family and Social History Unknown History: Yes; Former smoker - smoked when she was young; Marital Status - Widowed; Alcohol Use: Never; Drug Use: No History; Caffeine Use: Daily; Financial Concerns: No; Food, Clothing or Shelter Needs: No; Support System Lacking: No; Transportation Concerns: No Electronic Signature(s) Signed: 11/20/2022 1:00:30 PM By: [name] MD FACS Entered By: [name] on 11/20/2022 11:40:30 Abadi, [name]  ([id number]) [id number] Page 15 of 15 -------------------------------------------------------------------------------- SuperBill Details Patient Name: Date of Service: [name] 11/20/2022 Medical Record Number: [id number] Patient Account Number: [id number] Date of Birth/Sex: Treating RN: May 20, 1940 (82 y.o. 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 I10 Essential (primary) hypertension 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 atrial fibrillation Z79.01 Long term (current) use of anticoagulants I89.0 Lymphedema, not elsewhere classified Facility Procedures : CPT4 Code: [id number] Description: 97597 - DEBRIDE WOUND 1ST 20 SQ CM OR < ICD-10 Diagnosis Description 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  witho L97.525 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 wit Modifier: ut evidence of necr hout evidence of ne Quantity: 1 osis crosis Physician Procedures : CPT4 Code Description Modifier [id number] - WC PHYS LEVEL 4 - EST PT 25 ICD-10 Diagnosis Description L97.522 Non-pressure chronic ulcer of other part of left foot with fat layer exposed L97.526 Non-pressure chronic ulcer of other part of left foot  with bone involvement without evidence  of necro L97.525 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 without evidence of nec 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

## 2022-11-29 ENCOUNTER — Ambulatory Visit (HOSPITAL_BASED_OUTPATIENT_CLINIC_OR_DEPARTMENT_OTHER): Payer: Medicare Other | Admitting: General Surgery

## 2022-12-03 ENCOUNTER — Ambulatory Visit (HOSPITAL_BASED_OUTPATIENT_CLINIC_OR_DEPARTMENT_OTHER): Payer: Medicare Other | Admitting: Internal Medicine

## 2022-12-06 ENCOUNTER — Encounter (HOSPITAL_BASED_OUTPATIENT_CLINIC_OR_DEPARTMENT_OTHER): Payer: Medicare Other | Attending: General Surgery | Admitting: General Surgery

## 2022-12-06 DIAGNOSIS — I890 Lymphedema, not elsewhere classified: Secondary | ICD-10-CM | POA: Diagnosis not present

## 2022-12-06 DIAGNOSIS — L97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 without evidence of necrosis: Secondary | ICD-10-CM | POA: Diagnosis not present

## 2022-12-06 DIAGNOSIS — I48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 atrial fibrillation: Secondary | ICD-10-CM | POA: Insufficient documentation

## 2022-12-06 DIAGNOSIS — L97429 Non-pressure chronic ulcer of left heel and midfoot with unspecified severity: Secondary | ICD-10-CM | POA: Diagnosis present

## 2022-12-06 DIAGNOSIS — M1990 Unspecified osteoarthritis, unspecified site: Secondary | ICD-10-CM | POA: Insufficient documentation

## 2022-12-06 DIAGNOSIS — L97525 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 without evidence of necrosis: Secondary | ICD-10-CM | POA: Insufficient documentation

## 2022-12-06 DIAGNOSIS — D649 Anemia, unspecified: Secondary | ICD-10-CM | POA: Insufficient documentation

## 2022-12-06 DIAGNOSIS — I10 Essential (primary) hypertension: Secondary | ICD-10-CM | POA: Diagnosis not present

## 2022-12-06 DIAGNOSIS — L97812 Non-pressure chronic ulcer of other part of right lower leg with fat layer exposed: Secondary | ICD-10-CM | POA: Diagnosis not present

## 2022-12-06 DIAGNOSIS — M341 CR(E)ST syndrome: Secondary | ICD-10-CM | POA: Insufficient documentation

## 2022-12-06 NOTE — Progress Notes (Signed)
Kathryn, Joseph ([id number]) [id number] Page 1 of 18 Visit Report for 12/06/2022 Chief Complaint Document Details Patient Name: Date of Service: [name] RO N 12/06/2022 11:30 A M Medical Record Number: [id number] Patient Account Number: [id number] Date of Birth/Sex: Treating RN: 1941-02-28 (82 y.o. F) Primary Care Provider: [name], [facility] Other Clinician: Referring Provider: Treating Provider/Extender: [name], [facility] Weeks in Treatment: 25 Information Obtained from: Patient Chief Complaint Patient seen for complaints of Non-Healing Wound. Electronic Signature(s) Signed: 12/06/2022 12:10:32 PM By: [name] MD FACS Entered By: [name] on 12/06/2022 12:10:31 -------------------------------------------------------------------------------- Debridement Details Patient Name: Date of Service: [name] [facility] RO N 12/06/2022 11:30 A M Medical Record Number: [id number] Patient Account Number: [id number] Date of Birth/Sex: Treating RN: 1940/08/28 (82 y.o. Fredderick Joseph Primary Care Provider: [name], [facility] Other Clinician: Referring Provider: Treating Provider/Extender: [name], [facility] Weeks in Treatment: 25 Debridement Performed for Assessment: Wound #8 Left,Lateral Foot Performed By: Physician [name], MD Debridement Type: Debridement Level of Consciousness (Pre-procedure): Awake and Alert Pre-procedure Verification/Time Out Yes - 11:51 Taken: Start Time: 11:51 Pain Control: Lidocaine 4% T opical Solution Percent of Wound Bed Debrided: 100% T Area Debrided (cm): otal 0.44 Tissue and other material debrided: Non-Viable, Slough, Slough Level: Non-Viable Tissue Debridement Description: Selective/Open Wound Instrument: Curette Bleeding: Minimum Hemostasis Achieved: Pressure Response to Treatment: Procedure was tolerated well Level of Consciousness (Post- Awake and Alert procedure): Post  Debridement Measurements of Total Wound Length: (cm) 0.8 Stage: Category/Stage IV Width: (cm) 0.7 Depth: (cm) 0.3 Volume: (cm) 0.132 Character of Wound/Ulcer Post Debridement: Improved Post Procedure Diagnosis Same as Pre-procedure CHEVRIER, Twilia ([id number]) [id number] Page 2 of 18 Notes Scribed for Dr Lady Gary by [name], RN Electronic Signature(s) Signed: 12/06/2022 12:34:47 PM By: [name] MD FACS Signed: 12/06/2022 3:56:49 PM By: Gelene Mink By: [name] on 12/06/2022 11:52:55 -------------------------------------------------------------------------------- Debridement Details Patient Name: Date of Service: [name], [facility] RO N 12/06/2022 11:30 A M Medical Record Number: [id number] Patient Account Number: [id number] Date of Birth/Sex: Treating RN: October 01, 1940 (82 y.o. Fredderick Joseph Primary Care Provider: [name], [facility] Other Clinician: Referring Provider: Treating Provider/Extender: [name], [facility] Weeks in Treatment: 25 Debridement Performed for Assessment: Wound #9 Left,Lateral T Great oe Performed By: Physician [name], MD Debridement Type: Debridement Level of Consciousness (Pre-procedure): Awake and Alert Pre-procedure Verification/Time Out Yes - 11:51 Taken: Start Time: 11:51 Pain Control: Lidocaine 4% T opical Solution Percent of Wound Bed Debrided: 100% T Area Debrided (cm): otal 0.05 Tissue and other material debrided: Non-Viable, Slough, Slough Level: Non-Viable Tissue Debridement Description: Selective/Open Wound Instrument: Curette Bleeding: Minimum Hemostasis Achieved: Pressure Response to Treatment: Procedure was tolerated well Level of Consciousness (Post- Awake and Alert procedure): Post Debridement Measurements of Total Wound Length: (cm) 0.2 Width: (cm) 0.3 Depth: (cm) 0.1 Volume: (cm) 0.005 Character of Wound/Ulcer Post Debridement:  Improved Post Procedure Diagnosis Same as Pre-procedure Notes Scribed for Dr Lady Gary by [name], RN Electronic Signature(s) Signed: 12/06/2022 12:34:47 PM By: [name] MD FACS Signed: 12/06/2022 3:56:49 PM By: [name] Entered By: [name] on 12/06/2022 11:55:09 -------------------------------------------------------------------------------- Debridement Details Patient Name: Date of Service: [name], [facility] RO N 12/06/2022 11:30 A M Medical Record Number: [id number] Patient Account Number: [id number] Kathryn, Joseph ([id number]) [id number] Page 3 of 18 Date of Birth/Sex: Treating RN: 09/12/1940 (82 y.o. Fredderick Joseph Primary Care Provider: [name], [facility] Other Clinician: Referring Provider: Treating Provider/Extender: [name] SHO KES, [facility] Weeks in Treatment:  25 Debridement Performed for Assessment: Wound #6 Left,Dorsal Foot Performed By: Physician [name], MD Debridement Type: Debridement Level of Consciousness (Pre-procedure): Awake and Alert Pre-procedure Verification/Time Out Yes - 11:51 Taken: Start Time: 11:51 Pain Control: Lidocaine 4% T opical Solution Percent of Wound Bed Debrided: 90% T Area Debrided (cm): otal 6.36 Tissue and other material debrided: Non-Viable, Slough, Slough Level: Non-Viable Tissue Debridement Description: Selective/Open Wound Instrument: Curette Bleeding: Minimum Hemostasis Achieved: Pressure Response to Treatment: Procedure was tolerated well Level of Consciousness (Post- Awake and Alert procedure): Post Debridement Measurements of Total Wound Length: (cm) 3 Width: (cm) 3 Depth: (cm) 0.1 Volume: (cm) 0.707 Character of Wound/Ulcer Post Debridement: Improved Post Procedure Diagnosis Same as Pre-procedure Notes Scribed for Dr Lady Gary by [name], RN Electronic Signature(s) Signed: 12/06/2022 12:54:33 PM By: [name] MD FACS Signed:  12/06/2022 3:56:49 PM By: [name] Previous Signature: 12/06/2022 12:34:47 PM Version By: [name] MD FACS Entered By: [name] on 12/06/2022 12:49:50 -------------------------------------------------------------------------------- Debridement Details Patient Name: Date of Service: [name], [facility] RO N 12/06/2022 11:30 A M Medical Record Number: [id number] Patient Account Number: [id number] Date of Birth/Sex: Treating RN: 29-Jun-1940 (82 y.o. Fredderick Joseph Primary Care Provider: [name], [facility] Other Clinician: Referring Provider: Treating Provider/Extender: [name], [facility] Weeks in Treatment: 25 Debridement Performed for Assessment: Wound #10 Left T Second oe Performed By: Physician [name], MD Debridement Type: Debridement Level of Consciousness (Pre-procedure): Awake and Alert Pre-procedure Verification/Time Out Yes - 11:51 Taken: Start Time: 11:51 Pain Control: Lidocaine 4% T opical Solution Percent of Wound Bed Debrided: 100% T Area Debrided (cm): otal 0.2 Tissue and other material debrided: Non-Viable, Slough, Slough Level: Non-Viable Tissue Debridement Description: Selective/Open Wound Instrument: Curette Bleeding: Minimum Hemostasis Achieved: Pressure Response to Treatment: Procedure was tolerated well Guedes, Adilenne ([id number]) [id number] Page 4 of 18 Level of Consciousness (Post- Awake and Alert procedure): Post Debridement Measurements of Total Wound Length: (cm) 0.5 Width: (cm) 0.5 Depth: (cm) 0.1 Volume: (cm) 0.02 Character of Wound/Ulcer Post Debridement: Improved Post Procedure Diagnosis Same as Pre-procedure Notes scribed for Dr. Lady Gary by [name], RN Electronic Signature(s) Signed: 12/06/2022 12:54:33 PM By: [name] MD FACS Signed: 12/06/2022 3:56:49 PM By: [name] Entered By: [name] on 12/06/2022  12:50:21 -------------------------------------------------------------------------------- HPI Details Patient Name: Date of Service: [name], [facility] RO N 12/06/2022 11:30 A M Medical Record Number: [id number] Patient Account Number: [id number] Date of Birth/Sex: Treating RN: 29-Jan-1941 (82 y.o. F) Primary Care Provider: [name], [facility] Other Clinician: Referring Provider: Treating Provider/Extender: [name], [facility] Weeks in Treatment: 25 History of Present Illness HPI Description: ADMISSION 06/12/2022 This is an 82 year old woman with a history of CVA, crest syndrome, atrial fibrillation, rocker-bottom foot deformity. 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 She has been followed by podiatry for this and I am not entirely clear as to how she came to be referred here. She resides in an assisted living facility. It is not clear what they have been putting on her wound, but on intake, she was noted to have denuded skin on her medial third toe, as well as ulcers on her dorsal great toe and lateral great toe. There is slough accumulation on both of the toe ulcers. There was an odor noted at intake, but after her foot was washed, the odor dissipated. Her toes are folded on top of each other creating areas of abrasion and pockets for moisture collection, which seems  to be the primary cause of her ulceration. 06/20/2022: The skin between her toes and on the ball of her foot is completely macerated. There has been more tissue breakdown. The wound on her great toe has some slough accumulation. 06/27/2022: No change to her wounds today. There has been no further deterioration, but no significant improvement. She was both hypotensive and bradycardic on intake. 07/11/2022: Today, her foot is completely macerated. She reports that the wound care nurse actually soaked her foot and then applied foam, despite our specific orders to not use foam at all.  She also is draining serous fluid from both legs and has 2+ pitting edema. She is on furosemide 20 mg twice a day. 07/19/2022: Once again, her foot is completely macerated. There has been further tissue breakdown to the second and third toes and she now has ulcers on the distal ends. The wounds on her medial and lateral great toe have thick slough accumulation. Apparently Xeroform was found between her toes on intake. Edema control is improved, but still not perfect. No overt drainage from her legs appreciated on exam today. 07/27/2022: She has less tissue maceration today. Edema control in her bilateral lower extremities is improved. Still with slough accumulation on all open wound surfaces. 08/08/2022: Significant improvement this week. She has very little tissue maceration and according to her aide, there has been very little drainage from her legs. There is some slough accumulation on the medial great toe ulcer. Edema control is improved bilaterally. She is spending more time in her bed with her legs elevated and less in her wheelchair with her legs in a dependent position. 08/20/2022: The edema in her legs is now well-controlled with the use of the zinc Unna boots. Unfortunately, this seems to have resulted in more drainage coming from the open areas of her feet. They are a bit macerated but there has not been as much tissue breakdown secondary to moisture as we have seen on previous visits. 08/28/2022: The only remaining open wound in her foot is on the dorsal great toe. The improvement in the rest of the foot is quite dramatic, with no tissue maceration or breakdown. She has been elevating her legs and wearing compression wraps. Edema control is excellent and there has been no drainage from her legs. 09/05/2022: Unfortunately, the distal half of her dorsal foot has broken down and has a layer of slough on the surface. This appears to be secondary to moisture accumulation. Her dressing was completely  saturated. BERNETTA, DESARRO ([id number]) [id number] Page 5 of 18 09/20/2022: There has been further deterioration of her foot. The dressing that was applied was bizarre and involved Coflex foam wrapped around her foot to the ankle and then Kerlix and Coban over that to the knee. She fortunately did have silver alginate between her toes but the tissue breakdown from moisture is extensive. 09/25/2022: There has been massive improvement in her foot since last week. The maceration has decreased, edema control is markedly better, and the wounds are showing evidence of healing, as opposed to worsening. 10/03/2022: The wound on her great toe is much smaller with minimal slough accumulation. The dorsal foot is also improving. She has a new ulcer on the plantar surface of her left fourth toe, however, and bone is exposed. Edema control and tissue maceration continues to be significantly better now that we are doing all of the dressing care. 10/09/2022: Unfortunately, it seems that the patient has resumed her habit of sitting in her wheelchair with her  legs in a dependent position and there has been a lot more drainage and maceration on her foot. The dorsal foot wounds have expanded and are deeper. She has a new wound on her second toe and although the initial wound on her great toe has healed, she has a new wound on the lateral aspect of her great toe, immediately adjacent to that on her second toe, suggesting these have been caused by friction of the 2 toes rubbing against each other. Her son is participating in this visit via speaker phone. 10/24/2022: Last week she had a nurse visit due to clinic capacity and it appears that her dressing was done differently by the nurse that saw her then how it is usually performed by her regular nurse. Unfortunately, this meant there was more moisture-related tissue breakdown. The dorsum of her foot is open again and she has a new wound on the  lateral aspect of her fifth toe. There is slough accumulation in each of the sites. 11/01/2022: There has been remarkable turnaround since her last visit. All of the wounds are smaller. The wound on the dorsal aspect of her great toe has closed completely. The wounds on the top of her foot have contracted and are starting to epithelialize. The new wound on the lateral aspect of her fifth toe does have some tendon exposure today that was not appreciated at her last visit. There is more tissue over the exposed bone on the plantar surface of her fourth toe, although bone does still remain exposed. 11/08/2022: All of her wounds are looking better again this week. The exposed bone on the plantar surface of the fourth toe has now been covered with soft tissue. The dorsal foot wounds are epithelializing nicely. The lateral fifth toe wound is smaller. There is slough on all of the surfaces. She had ABIs done in the vascular lab last week. Results are copied here: ABI Findings: +---------+------------------+-----+----------+--------+ Right Rt Pressure (mmHg)IndexWaveform Comment  +---------+------------------+-----+----------+--------+ Brachial 132     +---------+------------------+-----+----------+--------+ PTA 108 0.82 monophasic  +---------+------------------+-----+----------+--------+ DP 81 0.61 monophasic  +---------+------------------+-----+----------+--------+ Great T oe24 0.18 Abnormal   +---------+------------------+-----+----------+--------+ +---------+------------------+-----+----------+--------------------------------+ Left Lt Pressure (mmHg)IndexWaveform Comment  +---------+------------------+-----+----------+--------------------------------+ Brachial 121     +---------+------------------+-----+----------+--------------------------------+ PTA 88 0.67 monophasic   +---------+------------------+-----+----------+--------------------------------+ DP 111 0.84 monophasic  +---------+------------------+-----+----------+--------------------------------+ Great T   unable to obtain due to  oe     bandaging/ wound  +---------+------------------+-----+----------+--------------------------------+ +-------+-----------+-----------+------------+------------+ ABI/TBIT oday's ABIT oday's TBIPrevious ABIPrevious TBI +-------+-----------+-----------+------------+------------+ Right 0.82 0.18    +-------+-----------+-----------+------------+------------+ Left 0.84     +-------+-----------+-----------+------------+------------+ Summary: Right: Resting right ankle-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 The right toe-brachial index is abnormal. Left: Resting left ankle-brachial index indicates mild left lower extremity arterial disease. Unable to obtain TBI due to bandaging/wound. *See table(s) above for measurements and observations. 11/16/2022: All of her wounds are improving. The wound on the plantar surface of the fourth toe has good tissue covering the bone. The dorsal foot areas are epithelializing. The lateral foot ulcer still has muscle exposed; this is probably the most severe of her wounds at this time. She is scheduled to see vascular surgery on August 20. 11/20/2022: The wound on the plantar surface of the fourth toe seems to be closed. The dorsal foot areas are smaller. The lateral foot ulcer has muscle and tendon exposed. The medial great toe ulcer has bone exposed and the lateral great toe ulcer just has some slough on the surface. Unfortunately, the dorsal second toe ulcer now also has tendon exposure.  12/06/2022: The dorsal foot areas continue to contract. The lateral foot ulcer has some tendon exposure, but there is no longer any necrotic muscle or other nonviable soft tissue. The medial  great toe ulcer is about the same with bone exposure. The dorsal second toe ulcer also has bone exposure. She has a new wound on her right anterior tibial surface. The fat layer is exposed but it is fairly small. It is clean without any slough or eschar accumulation. Electronic Signature(s) Signed: 12/06/2022 12:11:57 PM By: [name] MD FACS Entered By: [name] on 12/06/2022 12:11:57 Worthey, [name] ([id number]) [id number] Page 6 of 18 -------------------------------------------------------------------------------- Physical Exam Details Patient Name: Date of Service: [name] 12/06/2022 11:30 A M Medical Record Number: [id number] Patient Account Number: [id number] Date of Birth/Sex: Treating RN: 05-27-1940 (82 y.o. F) Primary Care Provider: [name], [facility] Other Clinician: Referring Provider: Treating Provider/Extender: [name] SHO KES, [facility] Weeks in Treatment: 25 Constitutional . . . . no acute distress. Respiratory Normal work of breathing on room air. Notes 12/06/2022: The dorsal foot areas continue to contract. The lateral foot ulcer has some tendon exposure, but there is no longer any necrotic muscle or other nonviable soft tissue. The medial great toe ulcer is about the same with bone exposure. The dorsal second toe ulcer also has bone exposure. She has a new wound on her right anterior tibial surface. The fat layer is exposed but it is fairly small. It is clean without any slough or eschar accumulation. Electronic Signature(s) Signed: 12/06/2022 12:15:58 PM By: [name] MD FACS Entered By: [name] on 12/06/2022 12:15:58 -------------------------------------------------------------------------------- Physician Orders Details Patient Name: Date of Service: [name] [facility] RO N 12/06/2022 11:30 A M Medical Record Number: [id number] Patient Account Number: [id number] Date of Birth/Sex: Treating  RN: [birth year] (82 y.o. 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 - Dr. Lady Gary - room 2 DO NOT CHANGE DRESSING - Return A Anesthetic (In clinic) Topical Lidocaine 4% applied to wound bed - USED in Clinic [address] [place], [place] ([id number]) [id number] Page 7 of 18 May shower with protection but do not get wound dressing(s) wet. Protect dressing(s) with water repellant cover (for example, large plastic bag) or a cast cover and may then take shower. Edema Control - Lymphedema / SCD / Other Elevate legs to the level of the heart or above for 30 minutes daily and/or when sitting for 3-4 times a day throughout the day. Avoid standing for long periods of time. Non Wound Condition Right Lower Extremity Other Non Wound Condition Orders/Instructions: - urgo lite or 3 layer compression wrap on right and left lower leg to be changed by facility Wound Treatment Wound #10 - T Second oe Wound  Laterality: Left Cleanser: Soap and  Water 1 x Per Week/30 Days Discharge Instructions: May shower and wash wound with dial antibacterial soap and water prior to dressing change. Cleanser: Wound Cleanser 1 x Per Week/30 Days Discharge Instructions: Cleanse the wound with wound cleanser prior to applying a clean dressing using gauze sponges, not tissue or cotton balls. Peri-Wound Care: Sween Lotion (Moisturizing lotion) 1 x Per Week/30 Days Discharge Instructions: Apply moisturizing lotion as directed Topical: Gentamicin 1 x Per Week/30 Days Discharge Instructions: As directed by physician Topical: Mupirocin Ointment 1 x Per Week/30 Days Discharge Instructions: Apply Mupirocin (Bactroban) as instructed Topical: Ketoconazole Cream 2% 1 x Per Week/30 Days Discharge Instructions: Apply Ketoconazole as directed Topical: Triamcinolone 1 x Per Week/30 Days Discharge Instructions: Apply Triamcinolone as directed Topical: zinc 1 x Per Week/30 Days Prim Dressing: Endoform 2x2 in 1 x Per Week/30 Days ary Discharge Instructions: Moisten with saline Secondary Dressing: ABD Pad, 8x10 1 x Per Week/30 Days Discharge Instructions: Apply over primary dressing as directed. Secondary Dressing: Drawtex 4x4 in 1 x Per Week/30 Days Discharge Instructions: Apply over primary dressing as directed. Secondary Dressing: Woven Gauze Sponge, Non-Sterile 4x4 in 1 x Per Week/30 Days Discharge Instructions: can use gauze between toes (if no desired) Secondary Dressing: Zetuvit Plus 4x8 in 1 x Per Week/30 Days Discharge Instructions: Apply over primary dressing as directed. Compression Wrap: Urgo K2 Lite, (equivalent to a 3 layer) two layer compression system, regular 1 x Per Week/30 Days Discharge Instructions: Apply Urgo K2 Lite as directed (alternative to 3 layer compression). Wound #11 - Lower Leg Wound Laterality: Right, Anterior Cleanser: Soap and Water 1 x Per Week/30 Days Discharge Instructions: May shower and wash wound with dial antibacterial  soap and water prior to dressing change. Cleanser: Wound Cleanser 1 x Per Week/30 Days Discharge Instructions: Cleanse the wound with wound cleanser prior to applying a clean dressing using gauze sponges, not tissue or cotton balls. Peri-Wound Care: Sween Lotion (Moisturizing lotion) 1 x Per Week/30 Days Discharge Instructions: Apply moisturizing lotion as directed Prim Dressing: Maxorb Extra Ag+ Alginate Dressing, 2x2 (in/in) 1 x Per Week/30 Days ary Discharge Instructions: Apply to wound bed as instructed Secondary Dressing: Woven Gauze Sponge, Non-Sterile 4x4 in 1 x Per Week/30 Days Discharge Instructions: Apply over primary dressing as directed. Compression Wrap: Urgo K2 Lite, (equivalent to a 3 layer) two layer compression system, regular 1 x Per Week/30 Days Discharge Instructions: Apply Urgo K2 Lite as directed (alternative to 3 layer compression). Wound #6 - Foot Wound Laterality: Dorsal, Left Cleanser: Soap and Water 1 x Per Week/30 Days Discharge Instructions: May shower and wash wound with dial antibacterial soap and water prior to dressing change. IVYANNA, BERGGREN ([id number]) [id number] Page 8 of 18 Cleanser: Wound Cleanser 1 x Per Week/30 Days Discharge Instructions: Cleanse the wound with wound cleanser prior to applying a clean dressing using gauze sponges, not tissue or cotton balls. Peri-Wound Care: Sween Lotion (Moisturizing lotion) 1 x Per Week/30 Days Discharge Instructions: Apply moisturizing lotion as directed Topical: Gentamicin 1 x Per Week/30 Days Discharge Instructions: As directed by physician Topical: Mupirocin Ointment 1 x Per Week/30 Days Discharge Instructions: Apply Mupirocin (Bactroban) as instructed Topical: Ketoconazole Cream 2% 1 x Per Week/30 Days Discharge Instructions: Apply Ketoconazole as directed Topical: Triamcinolone 1 x Per Week/30 Days Discharge Instructions: Apply Triamcinolone as directed Topical: zinc 1 x Per  Week/30 Days Prim Dressing: Maxorb Extra Ag+ Alginate Dressing, 4x4.75 (in/in) 1 x Per Week/30 Days ary Discharge Instructions: Apply  to wound bed as instructed Secondary Dressing: ABD Pad, 8x10 1 x Per Week/30 Days Discharge Instructions: Apply over primary dressing as directed. Secondary Dressing: Drawtex 4x4 in 1 x Per Week/30 Days Discharge Instructions: Apply over primary dressing as directed. Secondary Dressing: Woven Gauze Sponge, Non-Sterile 4x4 in 1 x Per Week/30 Days Discharge Instructions: can use gauze between toes (if no desired) Secondary Dressing: Zetuvit Plus 4x8 in 1 x Per Week/30 Days Discharge Instructions: Apply over primary dressing as directed. Compression Wrap: Urgo K2 Lite, (equivalent to a 3 layer) two layer compression system, regular 1 x Per Week/30 Days Discharge Instructions: Apply Urgo K2 Lite as directed (alternative to 3 layer compression). Wound #8 - Foot Wound Laterality: Left, Lateral Cleanser: Soap and Water 1 x Per Week/30 Days Discharge Instructions: May shower and wash wound with dial antibacterial soap and water prior to dressing change. Cleanser: Wound Cleanser 1 x Per Week/30 Days Discharge Instructions: Cleanse the wound with wound cleanser prior to applying a clean dressing using gauze sponges, not tissue or cotton balls. Peri-Wound Care: Sween Lotion (Moisturizing lotion) 1 x Per Week/30 Days Discharge Instructions: Apply moisturizing lotion as directed Topical: Gentamicin 1 x Per Week/30 Days Discharge Instructions: As directed by physician Topical: Mupirocin Ointment 1 x Per Week/30 Days Discharge Instructions: Apply Mupirocin (Bactroban) as instructed Topical: Ketoconazole Cream 2% 1 x Per Week/30 Days Discharge Instructions: Apply Ketoconazole as directed Topical: Triamcinolone 1 x Per Week/30 Days Discharge Instructions: Apply Triamcinolone as directed Topical: zinc 1 x Per Week/30 Days Prim Dressing: Endoform 2x2 in 1 x Per Week/30  Days ary Discharge Instructions: Moisten with saline Secondary Dressing: ABD Pad, 8x10 1 x Per Week/30 Days Discharge Instructions: Apply over primary dressing as directed. Secondary Dressing: Drawtex 4x4 in 1 x Per Week/30 Days Discharge Instructions: Apply over primary dressing as directed. Secondary Dressing: Woven Gauze Sponge, Non-Sterile 4x4 in 1 x Per Week/30 Days Discharge Instructions: can use gauze between toes (if no desired) Secondary Dressing: Zetuvit Plus 4x8 in 1 x Per Week/30 Days Discharge Instructions: Apply over primary dressing as directed. Compression Wrap: Urgo K2 Lite, (equivalent to a 3 layer) two layer compression system, regular 1 x Per Week/30 Days Mirabella, Torryn ([id number]) [id number] Page 9 of 18 Discharge Instructions: Apply Urgo K2 Lite as directed (alternative to 3 layer compression). Wound #9 - T Great oe Wound Laterality: Left, Lateral Cleanser: Soap and Water 1 x Per Week/30 Days Discharge Instructions: May shower and wash wound with dial antibacterial soap and water prior to dressing change. Cleanser: Wound Cleanser 1 x Per Week/30 Days Discharge Instructions: Cleanse the wound with wound cleanser prior to applying a clean dressing using gauze sponges, not tissue or cotton balls. Peri-Wound Care: Sween Lotion (Moisturizing lotion) 1 x Per Week/30 Days Discharge Instructions: Apply moisturizing lotion as directed Topical: Gentamicin 1 x Per Week/30 Days Discharge Instructions: As directed by physician Topical: Mupirocin Ointment 1 x Per Week/30 Days Discharge Instructions: Apply Mupirocin (Bactroban) as instructed Topical: Ketoconazole Cream 2% 1 x Per Week/30 Days Discharge Instructions: Apply Ketoconazole as directed Topical: Triamcinolone 1 x Per Week/30 Days Discharge Instructions: Apply Triamcinolone as directed Topical: zinc 1 x Per Week/30 Days Prim Dressing: Endoform 2x2 in 1 x Per Week/30 Days ary Discharge  Instructions: Moisten with saline Secondary Dressing: ABD Pad, 8x10 1 x Per Week/30 Days Discharge Instructions: Apply over primary dressing as directed. Secondary Dressing: Drawtex 4x4 in 1 x Per Week/30 Days Discharge Instructions: Apply over primary dressing as directed. Secondary Dressing: Woven  Gauze Sponge, Non-Sterile 4x4 in 1 x Per Week/30 Days Discharge Instructions: can use gauze between toes (if no desired) Secondary Dressing: Zetuvit Plus 4x8 in 1 x Per Week/30 Days Discharge Instructions: Apply over primary dressing as directed. Compression Wrap: Urgo K2 Lite, (equivalent to a 3 layer) two layer compression system, regular 1 x Per Week/30 Days Discharge Instructions: Apply Urgo K2 Lite as directed (alternative to 3 layer compression). Patient Medications llergies: Sulfa (Sulfonamide Antibiotics) A Notifications Medication Indication Start End 12/06/2022 lidocaine DOSE topical 4 % cream - cream topical Electronic Signature(s) Signed: 12/06/2022 12:54:33 PM By: [name] MD FACS Signed: 12/06/2022 3:56:49 PM By: [name] Previous Signature: 12/06/2022 12:34:47 PM Version By: [name] MD FACS Entered By: [name] on 12/06/2022 12:52:15 -------------------------------------------------------------------------------- Problem List Details Patient Name: Date of Service: [name] [facility] RO N 12/06/2022 11:30 A M Medical Record Number: [id number] Patient Account Number: [id number] Date of Birth/Sex: Treating RN: 22-Aug-1940 (82 y.o. 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 10/03/2022 No Yes without evidence of necrosis L97.525 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 11/01/2022 No Yes without evidence of necrosis L97.812 Non-pressure chronic ulcer of other part of right lower leg with fat layer 12/06/2022 No Yes exposed 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 06/12/2022 No Yes I10 Essential (primary) hypertension 06/12/2022 No Yes 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 06/12/2022 No Yes 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 atrial fibrillation 06/12/2022 No Yes Z79.01 Long term (current) use of anticoagulants 06/12/2022 No Yes I89.0 Lymphedema, not elsewhere classified 08/20/2022 No Yes Inactive Problems ICD-10 Code Description Active Date Inactive Date L97.521 Non-pressure chronic ulcer of other part of left foot limited to breakdown of skin 06/12/2022 06/12/2022 Resolved Problems Electronic Signature(s) Signed: 12/06/2022 12:10:00 PM By: [name] MD FACS Entered By: [name] on 12/06/2022 12:10:00 Progress Note Details -------------------------------------------------------------------------------- Marsh Dolly ([id number]) [id number] Page 11 of 18 Patient Name: Date of Service: [name] 12/06/2022 11:30 A M Medical Record Number: [id number] Patient Account Number: [id number] Date of Birth/Sex: Treating RN: 1940-05-17 (82 y.o. F) Primary Care Provider: [name], [facility] Other Clinician: Referring Provider: Treating Provider/Extender: [name], [facility] Weeks in Treatment: 25 Subjective Chief Complaint Information obtained from Patient Patient seen for complaints of Non-Healing Wound. History of Present Illness (HPI) ADMISSION 06/12/2022 This is an 82 year old woman with a history of CVA, crest syndrome, atrial fibrillation, rocker-bottom foot deformity. She  apparently developed an ulcer on her left great toe secondary to her AFO prosthetic. She has been followed by podiatry for this and I am not entirely clear as to how she came to be referred here. She resides in an assisted living facility. It is not clear what they have been putting on her wound, but on intake, she was noted to have denuded skin on her medial third toe, as well as ulcers on her dorsal great toe and lateral great toe. There is slough accumulation on both of the toe ulcers. There was an odor noted at intake, but after her foot was washed, the odor dissipated. Her toes are folded on top of  each other creating areas of abrasion and pockets for moisture collection, which seems to be the primary cause of her ulceration. 06/20/2022: The skin between her toes and on the ball of her foot is completely macerated. There has been more tissue breakdown. The wound on her great toe has some slough accumulation. 06/27/2022: No change to her wounds today. There has been no further deterioration, but no significant improvement. She was both hypotensive and bradycardic on intake. 07/11/2022: Today, her foot is completely macerated. She reports that the wound care nurse actually soaked her foot and then applied foam, despite our specific orders to not use foam at all. She also is draining serous fluid from both legs and has 2+ pitting edema. She is on furosemide 20 mg twice a day. 07/19/2022: Once again, her foot is completely macerated. There has been further tissue breakdown to the second and third toes and she now has ulcers on the distal ends. The wounds on her medial and lateral great toe have thick slough accumulation. Apparently Xeroform was found between her toes on intake. Edema control is improved, but still not perfect. No overt drainage from her legs appreciated on exam today. 07/27/2022: She has less tissue maceration today. Edema control in her bilateral lower extremities is improved. Still with  slough accumulation on all open wound surfaces. 08/08/2022: Significant improvement this week. She has very little tissue maceration and according to her aide, there has been very little drainage from her legs. There is some slough accumulation on the medial great toe ulcer. Edema control is improved bilaterally. She is spending more time in her bed with her legs elevated and less in her wheelchair with her legs in a dependent position. 08/20/2022: The edema in her legs is now well-controlled with the use of the zinc Unna boots. Unfortunately, this seems to have resulted in more drainage coming from the open areas of her feet. They are a bit macerated but there has not been as much tissue breakdown secondary to moisture as we have seen on previous visits. 08/28/2022: The only remaining open wound in her foot is on the dorsal great toe. The improvement in the rest of the foot is quite dramatic, with no tissue maceration or breakdown. She has been elevating her legs and wearing compression wraps. Edema control is excellent and there has been no drainage from her legs. 09/05/2022: Unfortunately, the distal half of her dorsal foot has broken down and has a layer of slough on the surface. This appears to be secondary to moisture accumulation. Her dressing was completely saturated. 09/20/2022: There has been further deterioration of her foot. The dressing that was applied was bizarre and involved Coflex foam wrapped around her foot to the ankle and then Kerlix and Coban over that to the knee. She fortunately did have silver alginate between her toes but the tissue breakdown from moisture is extensive. 09/25/2022: There has been massive improvement in her foot since last week. The maceration has decreased, edema control is markedly better, and the wounds are showing evidence of healing, as opposed to worsening. 10/03/2022: The wound on her great toe is much smaller with minimal slough accumulation. The dorsal foot  is also improving. She has a new ulcer on the plantar surface of her left fourth toe, however, and bone is exposed. Edema control and tissue maceration continues to be significantly better now that we are doing all of the dressing care. 10/09/2022: Unfortunately, it seems that the patient has resumed her habit of sitting  in her wheelchair with her legs in a dependent position and there has been a lot more drainage and maceration on her foot. The dorsal foot wounds have expanded and are deeper. She has a new wound on her second toe and although the initial wound on her great toe has healed, she has a new wound on the lateral aspect of her great toe, immediately adjacent to that on her second toe, suggesting these have been caused by friction of the 2 toes rubbing against each other. Her son is participating in this visit via speaker phone. 10/24/2022: Last week she had a nurse visit due to clinic capacity and it appears that her dressing was done differently by the nurse that saw her then how it is usually performed by her regular nurse. Unfortunately, this meant there was more moisture-related tissue breakdown. The dorsum of her foot is open again and she has a new wound on the lateral aspect of her fifth toe. There is slough accumulation in each of the sites. 11/01/2022: There has been remarkable turnaround since her last visit. All of the wounds are smaller. The wound on the dorsal aspect of her great toe has closed completely. The wounds on the top of her foot have contracted and are starting to epithelialize. The new wound on the lateral aspect of her fifth toe does have some tendon exposure today that was not appreciated at her last visit. There is more tissue over the exposed bone on the plantar surface of her fourth toe, although bone does still remain exposed. 11/08/2022: All of her wounds are looking better again this week. The exposed bone on the plantar surface of the fourth toe has now been  covered with soft tissue. The dorsal foot wounds are epithelializing nicely. The lateral fifth toe wound is smaller. There is slough on all of the surfaces. She had ABIs done in the vascular lab last week. Results are copied here: ABI Findings: +---------+------------------+-----+----------+--------+ Right Rt Pressure (mmHg)IndexWaveform Comment  +---------+------------------+-----+----------+--------+ Brachial 132     +---------+------------------+-----+----------+--------+ Engen, Karia ([id number]) [id number] Page 12 of 18 PTA 108 0.82 monophasic  +---------+------------------+-----+----------+--------+ DP 81 0.61 monophasic  +---------+------------------+-----+----------+--------+ Great T oe24 0.18 Abnormal   +---------+------------------+-----+----------+--------+ +---------+------------------+-----+----------+--------------------------------+ Left Lt Pressure (mmHg)IndexWaveform Comment  +---------+------------------+-----+----------+--------------------------------+ Brachial 121     +---------+------------------+-----+----------+--------------------------------+ PTA 88 0.67 monophasic  +---------+------------------+-----+----------+--------------------------------+ DP 111 0.84 monophasic  +---------+------------------+-----+----------+--------------------------------+ Great T   unable to obtain due to  oe     bandaging/ wound  +---------+------------------+-----+----------+--------------------------------+ +-------+-----------+-----------+------------+------------+ ABI/TBIT oday's ABIT oday's TBIPrevious ABIPrevious TBI +-------+-----------+-----------+------------+------------+ Right 0.82 0.18    +-------+-----------+-----------+------------+------------+ Left 0.84     +-------+-----------+-----------+------------+------------+ Summary: Right: Resting right  ankle-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 The right toe-brachial index is abnormal. Left: Resting left ankle-brachial index indicates mild left lower extremity arterial disease. Unable to obtain TBI due to bandaging/wound. *See table(s) above for measurements and observations. 11/16/2022: All of her wounds are improving. The wound on the plantar surface of the fourth toe has good tissue covering the bone. The dorsal foot areas are epithelializing. The lateral foot ulcer still has muscle exposed; this is probably the most severe of her wounds at this time. She is scheduled to see vascular surgery on August 20. 11/20/2022: The wound on the plantar surface of the fourth toe seems to be closed. The dorsal foot areas are smaller. The lateral foot ulcer has muscle and tendon exposed. The medial great toe ulcer has bone exposed and the lateral great toe ulcer just has some slough on  the surface. Unfortunately, the dorsal second toe ulcer now also has tendon exposure. 12/06/2022: The dorsal foot areas continue to contract. The lateral foot ulcer has some tendon exposure, but there is no longer any necrotic muscle or other nonviable soft tissue. The medial great toe ulcer is about the same with bone exposure. The dorsal second toe ulcer also has bone exposure. She has a new wound on her right anterior tibial surface. The fat layer is exposed but it is fairly small. It is clean without any slough or eschar accumulation. Patient History Information obtained from Patient. Family History Unknown History. Social History Former smoker - smoked when she was young, Marital Status - Widowed, Alcohol Use - Never, Drug Use - No History, Caffeine Use - Daily. Medical History Hematologic/Lymphatic Patient has history of Anemia Cardiovascular Patient has history of Angina - a-fib, Hypertension, Vasculitis Endocrine Denies history of Type I Diabetes, Type II  Diabetes Musculoskeletal Patient has history of Osteoarthritis Hospitalization/Surgery History - cholecystectomy. - leg surgery. - tonsillectomy. 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 Vitals Time Taken: 11:31 AM, Height: 67 in, Weight: 153 lbs, BMI: 24, Temperature: 98.1 F, Pulse: 87 bpm, Respiratory Rate: 18 breaths/min, Blood Pressure: 106/71 mmHg. Respiratory Normal work of breathing on room air. General Notes: 12/06/2022: The dorsal foot areas continue to contract. The lateral foot ulcer has some tendon exposure, but there is no longer any necrotic muscle or other nonviable soft tissue. The medial great toe ulcer is about the same with bone exposure. The dorsal second toe ulcer also has bone exposure. She has a new wound on her right anterior tibial surface. The fat layer is exposed but it is fairly small. It is clean without any slough or eschar accumulation. Integumentary (Hair, Skin) Wound #10 status is Open. Original cause of wound was Gradually Appeared. The date acquired was: 09/05/2022. The wound is located on the Left T Second. oe The wound measures 0.5cm length x 0.5cm width x 0.1cm depth; 0.196cm^2 area and 0.02cm^3 volume. There is bone and Fat Layer (Subcutaneous Tissue) exposed. There is no tunneling or undermining noted. There is a medium amount of serosanguineous drainage noted. The wound margin is distinct with the outline attached to the wound base. There is small (1-33%) red granulation within the wound bed. There is a large (67-100%) amount of necrotic tissue within the wound bed including Adherent Slough. The periwound skin appearance had no abnormalities noted for texture. The periwound skin appearance had no abnormalities noted for  moisture. The periwound skin appearance had no abnormalities noted for color. Periwound temperature was noted as No Abnormality. The periwound has tenderness on palpation. Wound #11 status is Open. Original cause of wound was Gradually Appeared. The date acquired was: 12/06/2022. The wound is located on the Right,Anterior Lower Leg. The wound measures 1cm length x 1cm width x 0.1cm depth; 0.785cm^2 area and 0.079cm^3 volume. There is Fat Layer (Subcutaneous Tissue) exposed. There is no tunneling or undermining noted. There is a medium amount of serosanguineous drainage noted. The wound margin is distinct with the outline attached to the wound base. There is large (67-100%) red granulation within the wound bed. There is a small (1-33%) amount of necrotic tissue within the wound bed including Adherent Slough. The periwound skin appearance had no abnormalities noted for texture. The periwound skin appearance had no abnormalities noted for color. The  periwound skin appearance exhibited: Dry/Scaly. Periwound temperature was noted as No Abnormality. Wound #6 status is Open. Original cause of wound was Gradually Appeared. The date acquired was: 09/05/2022. The wound has been in treatment 13 weeks. The wound is located on the Left,Dorsal Foot. The wound measures 3cm length x 3cm width x 0.1cm depth; 7.069cm^2 area and 0.707cm^3 volume. There is Fat Layer (Subcutaneous Tissue) exposed. There is no tunneling or undermining noted. There is a medium amount of serosanguineous drainage noted. The wound margin is indistinct and nonvisible. There is medium (34-66%) red, pink granulation within the wound bed. There is a medium (34-66%) amount of necrotic tissue within the wound bed including Adherent Slough. The periwound skin appearance had no abnormalities noted for texture. The periwound skin appearance had no abnormalities noted for moisture. The periwound skin appearance exhibited: Rubor. Periwound temperature was  noted as Cool/Cold. The periwound has tenderness on palpation. Wound #8 status is Open. Original cause of wound was Pressure Injury. The date acquired was: 10/24/2022. The wound has been in treatment 6 weeks. The wound is located on the Left,Lateral Foot. The wound measures 0.8cm length x 0.7cm width x 0.3cm depth; 0.44cm^2 area and 0.132cm^3 volume. There is tendon and Fat Layer (Subcutaneous Tissue) exposed. There is no tunneling or undermining noted. There is a medium amount of serosanguineous drainage noted. The wound margin is distinct with the outline attached to the wound base. There is small (1-33%) red granulation within the wound bed. There is a large (67-100%) amount of necrotic tissue within the wound bed including Adherent Slough. The periwound skin appearance had no abnormalities noted for texture. The periwound skin appearance had no abnormalities noted for moisture. The periwound skin appearance had no abnormalities noted for color. Periwound temperature was noted as No Abnormality. The periwound has tenderness on palpation. Wound #9 status is Open. Original cause of wound was Gradually Appeared. The date acquired was: 11/01/2022. The wound has been in treatment 5 weeks. The wound is located on the [employer]. The wound measures 0.2cm length x 0.3cm width x 0.1cm depth; 0.047cm^2 area and 0.005cm^3 volume. There oe is bone and Fat Layer (Subcutaneous Tissue) exposed. There is no tunneling or undermining noted. There is a small amount of serosanguineous drainage noted. The wound margin is flat and intact. There is small (1-33%) pink granulation within the wound bed. There is a large (67-100%) amount of necrotic tissue within the wound bed including Adherent Slough. The periwound skin appearance had no abnormalities noted for texture. The periwound skin appearance had no abnormalities noted for moisture. The periwound skin appearance exhibited: Rubor. Periwound temperature was  noted as Cool/Cold. The periwound has tenderness on palpation. 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 There was a Selective/Open Wound Non-Viable Tissue Debridement oe with a total area of 0.2 sq cm performed by [name], MD. With the following instrument(s): Curette to remove  Non-Viable tissue/material. Material removed includes Slough after achieving pain control using Lidocaine 4% Topical Solution. No specimens were taken. A time out was conducted at 11:51, prior to the start of the procedure. A Minimum amount of bleeding was controlled with Pressure. The procedure was tolerated well. Post Debridement Measurements: TANGELIA, VOGAN ([id number]) [id number] Page 14 of 18 0.5cm length x 0.5cm width x 0.1cm depth; 0.02cm^3 volume. Character of Wound/Ulcer Post Debridement is improved. Post procedure Diagnosis Wound #10: Same as Pre-Procedure General Notes: scribed for Dr. Lady Gary by [name], RN. Wound #6 Pre-procedure diagnosis of Wound #6 is a Lymphedema located on the Left,Dorsal Foot . There was a Selective/Open Wound Non-Viable Tissue Debridement with a total area of 6.36 sq cm performed by [name], MD. With the following instrument(s):  Curette to remove Non-Viable tissue/material. Material removed includes [facility] after achieving pain control using Lidocaine 4% Topical Solution. No specimens were taken. A time out was conducted at 11:51, prior to the start of the procedure. A Minimum amount of bleeding was controlled with Pressure. The procedure was tolerated well. Post Debridement Measurements: 3cm length x 3cm width x 0.1cm depth; 0.707cm^3 volume. Character of Wound/Ulcer Post Debridement is improved. Post procedure Diagnosis Wound #6: Same as Pre-Procedure General Notes: Scribed for Dr Lady Gary by [name], RN. Pre-procedure diagnosis of Wound #6 is a Lymphedema located on the Left,Dorsal Foot . There was a Three Layer Compression Therapy Procedure by [name], RN. 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 There was a Selective/Open Wound Non-Viable Tissue Debridement with a total area of 0.44 sq cm performed by [name], MD. With the following instrument(s): Curette to remove Non-Viable tissue/material. Material removed includes [facility] after achieving pain control using Lidocaine 4% Topical Solution. No specimens were taken. A time out was conducted at 11:51, prior to the start of the procedure. A Minimum amount of bleeding was controlled with Pressure. The procedure was tolerated well. Post Debridement Measurements: 0.8cm length x 0.7cm width x 0.3cm depth; 0.132cm^3 volume. Post debridement Stage noted as Category/Stage IV. Character of Wound/Ulcer Post Debridement is improved. Post procedure Diagnosis Wound #8: Same as Pre-Procedure General Notes: Scribed for Dr Lady Gary by [name], RN. 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 There was a Selective/Open Wound Non-Viable Tissue oe Debridement with a total area of  0.05 sq cm performed by [name], MD. With the following instrument(s): Curette to remove Non-Viable tissue/material. Material removed includes [facility] after achieving pain control using Lidocaine 4% Topical Solution. No specimens were taken. A time out was conducted at 11:51, prior to the start of the procedure. A Minimum amount of bleeding was controlled with Pressure. The procedure was tolerated well. Post Debridement Measurements: 0.2cm length x 0.3cm width x 0.1cm depth; 0.005cm^3 volume. Character of Wound/Ulcer Post Debridement is improved. Post procedure Diagnosis Wound #9: Same as Pre-Procedure General Notes: Scribed for Dr Lady Gary by [name], RN. Wound #11 Pre-procedure diagnosis of Wound #11 is a Lymphedema located on the Right,Anterior Lower Leg . There was a Three Layer Compression Therapy Procedure by [name], RN. Post procedure Diagnosis Wound #11: Same as Pre-Procedure Plan Follow-up Appointments: Return Appointment in 1 week. - Dr. Lady Gary - room 2 DO NOT CHANGE DRESSING - Anesthetic: (In clinic) Topical Lidocaine 4% applied to wound bed - USED in Clinic Bathing/ Shower/ Hygiene: May  shower with protection but do not get wound dressing(s) wet. Protect dressing(s) with water repellant cover (for example, large plastic bag) or a cast cover and may then take shower. Edema Control - Lymphedema / SCD / Other: Elevate legs to the level of the heart or above for 30 minutes daily and/or when sitting for 3-4 times a day throughout the day. Avoid standing for long periods of time. Non Wound Condition: Other Non Wound Condition Orders/Instructions: - urgo lite or 3 layer compression wrap on right and left lower leg to be changed by facility The following medication(s) was prescribed: lidocaine topical 4 % cream cream topical was prescribed at facility WOUND #10: - T Second Wound Laterality: Left oe Cleanser: Soap and Water 1 x Per Week/30 Days Discharge  Instructions: May shower and wash wound with dial antibacterial soap and water prior to dressing change. Cleanser: Wound Cleanser 1 x Per Week/30 Days Discharge Instructions: Cleanse the wound with wound cleanser prior to applying a clean dressing using gauze sponges, not tissue or cotton balls. Peri-Wound Care: Sween Lotion (Moisturizing lotion) 1 x Per Week/30 Days Discharge Instructions: Apply moisturizing lotion as directed Topical: Gentamicin 1 x Per Week/30 Days Discharge Instructions: As directed by physician Topical: Mupirocin Ointment 1 x Per Week/30 Days Discharge Instructions: Apply Mupirocin (Bactroban) as instructed Topical: Ketoconazole Cream 2% 1 x Per Week/30 Days Discharge Instructions: Apply Ketoconazole as directed Topical: Triamcinolone 1 x Per Week/30 Days Discharge Instructions: Apply Triamcinolone as directed Topical: zinc 1 x Per Week/30 Days Prim Dressing: Endoform 2x2 in 1 x Per Week/30 Days ary Discharge Instructions: Moisten with saline Secondary Dressing: ABD Pad, 8x10 1 x Per Week/30 Days Discharge Instructions: Apply over primary dressing as directed. Secondary Dressing: Drawtex 4x4 in 1 x Per Week/30 Days Discharge Instructions: Apply over primary dressing as directed. Secondary Dressing: Woven Gauze Sponge, Non-Sterile 4x4 in 1 x Per Week/30 Days Discharge Instructions: can use gauze between toes (if no desired) Secondary Dressing: Zetuvit Plus 4x8 in 1 x Per Week/30 Days Pfiffner, Lakevia ([id number]) [id number] Page 15 of 18 Discharge Instructions: Apply over primary dressing as directed. Com pression Wrap: Urgo K2 Lite, (equivalent to a 3 layer) two layer compression system, regular 1 x Per Week/30 Days Discharge Instructions: Apply Urgo K2 Lite as directed (alternative to 3 layer compression). WOUND #11: - Lower Leg Wound Laterality: Right, Anterior Cleanser: Soap and Water 1 x Per Week/30 Days Discharge Instructions: May  shower and wash wound with dial antibacterial soap and water prior to dressing change. Cleanser: Wound Cleanser 1 x Per Week/30 Days Discharge Instructions: Cleanse the wound with wound cleanser prior to applying a clean dressing using gauze sponges, not tissue or cotton balls. Peri-Wound Care: Sween Lotion (Moisturizing lotion) 1 x Per Week/30 Days Discharge Instructions: Apply moisturizing lotion as directed Prim Dressing: Maxorb Extra Ag+ Alginate Dressing, 2x2 (in/in) 1 x Per Week/30 Days ary Discharge Instructions: Apply to wound bed as instructed Secondary Dressing: Woven Gauze Sponge, Non-Sterile 4x4 in 1 x Per Week/30 Days Discharge Instructions: Apply over primary dressing as directed. Com pression Wrap: Urgo K2 Lite, (equivalent to a 3 layer) two layer compression system, regular 1 x Per Week/30 Days Discharge Instructions: Apply Urgo K2 Lite as directed (alternative to 3 layer compression). WOUND #6: - Foot Wound Laterality: Dorsal, Left Cleanser: Soap and Water 1 x Per Week/30 Days Discharge Instructions: May shower and wash wound with dial antibacterial soap and water prior to dressing change. Cleanser: Wound Cleanser 1 x Per Week/30 Days  Discharge Instructions: Cleanse the wound with wound cleanser prior to applying a clean dressing using gauze sponges, not tissue or cotton balls. Peri-Wound Care: Sween Lotion (Moisturizing lotion) 1 x Per Week/30 Days Discharge Instructions: Apply moisturizing lotion as directed Topical: Gentamicin 1 x Per Week/30 Days Discharge Instructions: As directed by physician Topical: Mupirocin Ointment 1 x Per Week/30 Days Discharge Instructions: Apply Mupirocin (Bactroban) as instructed Topical: Ketoconazole Cream 2% 1 x Per Week/30 Days Discharge Instructions: Apply Ketoconazole as directed Topical: Triamcinolone 1 x Per Week/30 Days Discharge Instructions: Apply Triamcinolone as directed Topical: zinc 1 x Per Week/30 Days Prim Dressing: Maxorb  Extra Ag+ Alginate Dressing, 4x4.75 (in/in) 1 x Per Week/30 Days ary Discharge Instructions: Apply to wound bed as instructed Secondary Dressing: ABD Pad, 8x10 1 x Per Week/30 Days Discharge Instructions: Apply over primary dressing as directed. Secondary Dressing: Drawtex 4x4 in 1 x Per Week/30 Days Discharge Instructions: Apply over primary dressing as directed. Secondary Dressing: Woven Gauze Sponge, Non-Sterile 4x4 in 1 x Per Week/30 Days Discharge Instructions: can use gauze between toes (if no desired) Secondary Dressing: Zetuvit Plus 4x8 in 1 x Per Week/30 Days Discharge Instructions: Apply over primary dressing as directed. Com pression Wrap: Urgo K2 Lite, (equivalent to a 3 layer) two layer compression system, regular 1 x Per Week/30 Days Discharge Instructions: Apply Urgo K2 Lite as directed (alternative to 3 layer compression). WOUND #8: - Foot Wound Laterality: Left, Lateral Cleanser: Soap and Water 1 x Per Week/30 Days Discharge Instructions: May shower and wash wound with dial antibacterial soap and water prior to dressing change. Cleanser: Wound Cleanser 1 x Per Week/30 Days Discharge Instructions: Cleanse the wound with wound cleanser prior to applying a clean dressing using gauze sponges, not tissue or cotton balls. Peri-Wound Care: Sween Lotion (Moisturizing lotion) 1 x Per Week/30 Days Discharge Instructions: Apply moisturizing lotion as directed Topical: Gentamicin 1 x Per Week/30 Days Discharge Instructions: As directed by physician Topical: Mupirocin Ointment 1 x Per Week/30 Days Discharge Instructions: Apply Mupirocin (Bactroban) as instructed Topical: Ketoconazole Cream 2% 1 x Per Week/30 Days Discharge Instructions: Apply Ketoconazole as directed Topical: Triamcinolone 1 x Per Week/30 Days Discharge Instructions: Apply Triamcinolone as directed Topical: zinc 1 x Per Week/30 Days Prim Dressing: Endoform 2x2 in 1 x Per Week/30 Days ary Discharge Instructions:  Moisten with saline Secondary Dressing: ABD Pad, 8x10 1 x Per Week/30 Days Discharge Instructions: Apply over primary dressing as directed. Secondary Dressing: Drawtex 4x4 in 1 x Per Week/30 Days Discharge Instructions: Apply over primary dressing as directed. Secondary Dressing: Woven Gauze Sponge, Non-Sterile 4x4 in 1 x Per Week/30 Days Discharge Instructions: can use gauze between toes (if no desired) Secondary Dressing: Zetuvit Plus 4x8 in 1 x Per Week/30 Days Discharge Instructions: Apply over primary dressing as directed. Com pression Wrap: Urgo K2 Lite, (equivalent to a 3 layer) two layer compression system, regular 1 x Per Week/30 Days Discharge Instructions: Apply Urgo K2 Lite as directed (alternative to 3 layer compression). WOUND #9: - T Great Wound Laterality: Left, Lateral oe Cleanser: Soap and Water 1 x Per Week/30 Days Discharge Instructions: May shower and wash wound with dial antibacterial soap and water prior to dressing change. Cleanser: Wound Cleanser 1 x Per Week/30 Days Discharge Instructions: Cleanse the wound with wound cleanser prior to applying a clean dressing using gauze sponges, not tissue or cotton balls. Peri-Wound Care: Sween Lotion (Moisturizing lotion) 1 x Per Week/30 Days Discharge Instructions: Apply moisturizing lotion as directed Topical: Gentamicin 1  x Per Week/30 Days Discharge Instructions: As directed by physician Topical: Mupirocin Ointment 1 x Per Week/30 Days Discharge Instructions: Apply Mupirocin (Bactroban) as instructed Topical: Ketoconazole Cream 2% 1 x Per Week/30 Days Discharge Instructions: Apply Ketoconazole as directed Topical: Triamcinolone 1 x Per Week/30 Days Discharge Instructions: Apply Triamcinolone as directed Topical: zinc 1 x Per Week/30 Days Prim Dressing: Endoform 2x2 in 1 x Per Week/30 Days ary Discharge Instructions: Moisten with saline Secondary Dressing: ABD Pad, 8x10 1 x Per Week/30 Days Discharge Instructions:  Apply over primary dressing as directed. SAHAANA, TRITTEN ([id number]) [id number] Page 16 of 18 Secondary Dressing: Drawtex 4x4 in 1 x Per Week/30 Days Discharge Instructions: Apply over primary dressing as directed. Secondary Dressing: Woven Gauze Sponge, Non-Sterile 4x4 in 1 x Per Week/30 Days Discharge Instructions: can use gauze between toes (if no desired) Secondary Dressing: Zetuvit Plus 4x8 in 1 x Per Week/30 Days Discharge Instructions: Apply over primary dressing as directed. Compression Wrap: Urgo K2 Lite, (equivalent to a 3 layer) two layer compression system, regular 1 x Per Week/30 Days Discharge Instructions: Apply Urgo K2 Lite as directed (alternative to 3 layer compression). 12/06/2022: The dorsal foot areas continue to contract. The lateral foot ulcer has some tendon exposure, but there is no longer any necrotic muscle or other nonviable soft tissue. The medial great toe ulcer is about the same with bone exposure. The dorsal second toe ulcer also has bone exposure. She has a new wound on her right anterior tibial surface. The fat layer is exposed but it is fairly small. It is clean without any slough or eschar accumulation. I used a curette to debride slough off of all of her wounds; within the wound on the right did not require debridement. We will apply silver alginate with an Urgo lite 3 layer compression equivalent wrap to the right leg. Continue topical gentamicin and mupirocin to the foot wounds with endoform on the wounds where bone or tendon is exposed and silver alginate everywhere else. Urgo lite wrap on this leg as well. Follow-up in 1 week. Electronic Signature(s) Signed: 12/06/2022 1:13:37 PM By: [name] MD FACS Signed: 12/06/2022 3:56:49 PM By: [name] Previous Signature: 12/06/2022 12:17:44 PM Version By: [name] MD FACS Entered By: [name] on 12/06/2022  12:57:32 -------------------------------------------------------------------------------- HxROS Details Patient Name: Date of Service: [name], [facility] RO N 12/06/2022 11:30 A M Medical Record Number: [id number] Patient Account Number: [id number] Date of Birth/Sex: Treating RN: 1940-05-13 (82 y.o. 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 Endocrine Medical History: Negative for: Type I Diabetes; Type II Diabetes Past Medical History Notes: hypothyroidism Musculoskeletal Medical History: Positive for: 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 Neurologic Medical History: Past Medical History Notes: stroke ZAVERI, Elesha ([id number]) [phone].pdf Page 17 of 18 Immunizations Pneumococcal Vaccine: Received Pneumococcal Vaccination: Yes Received Pneumococcal Vaccination On or After 60th Birthday: Yes Implantable Devices None Hospitalization / Surgery History Type of Hospitalization/Surgery cholecystectomy leg surgery tonsillectomy Family and Social History Unknown History: Yes; Former smoker - smoked when she was young; Marital Status - Widowed; Alcohol Use: Never; Drug Use: No History; Caffeine Use: Daily; Financial Concerns: No; Food, Clothing or Shelter Needs: No; Support System Lacking: No; Transportation Concerns: No Electronic Signature(s) Signed: 12/06/2022 12:34:47 PM By: Lady Gary,  Victorino Dike MD FACS Entered By: [name] on 12/06/2022 12:12:06 -------------------------------------------------------------------------------- SuperBill Details Patient Name: Date of Service: [name] [facility] RO N 12/06/2022 Medical Record Number: [id number] Patient Account Number: [id number] Date of Birth/Sex: Treating RN: [birth year] (82 y.o. 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 L97.812 Non-pressure chronic ulcer of other part of right lower leg with fat layer exposed 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 I10 Essential (primary) hypertension 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 atrial fibrillation Z79.01 Long term (current) use of anticoagulants I89.0 Lymphedema, not elsewhere classified Facility Procedures : CPT4 Code Description: [id number] - DEBRIDE WOUND 1ST 20 SQ CM OR < ICD-10 Diagnosis Description L97.522 Non-pressure chronic ulcer of other part of left foot with fat layer exposed L97.526 Non-pressure chronic ulcer of other part of left foot with  bone involvement without e L97.525 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 without L97.812 Non-pressure chronic ulcer of other part of right lower leg with fat layer exposed Modifier: vidence of necro evidence of nec Quantity: 1 sis rosis : CPT4 Code Description: [id number] (Facility Use Only) [id number] - APPLY MULTLAY COMPRS LWR RT LEG ICD-10 Diagnosis Description L97.812 Non-pressure chronic ulcer of other part of right lower leg with fat layer exposed Modifier: Quantity: 1 Physician Procedures Banita, Credeur Kaliope ([id number]): CPT4 Code Description [id number] - WC  PHYS LEVEL 4 - EST PT ICD-10 Diagnosis Description L97.522 Non-pressure chronic ulcer of other part of left foot with fat lay L97.526 Non-pressure chronic ulcer of other part of left  foot with bone in L97.525 Non-pressure chronic ulcer of other part of left foot with muscle L97.812 Non-pressure chronic ulcer of other part of right lower leg with f [id number] Page 18 of 18: Quantity Modifier 25 1 er exposed volvement without evidence of necrosis involvement without evidence of necrosis at layer exposed Soltau, Alasha ([id number]): [id number] - WC PHYS DEBR WO ANESTH 20 SQ CM ICD-10 Diagnosis Description L97.522 Non-pressure chronic ulcer of other part of left foot with fat lay L97.526 Non-pressure chronic ulcer of other part of left foot with bone  in L97.525 Non-pressure chronic ulcer of other part of left foot with muscle L97.812 Non-pressure chronic ulcer of other part of right lower leg with f [id number] Page 18 of 18: 1 er exposed volvement without evidence of necrosis involvement without evidence of necrosis at layer exposed Electronic Signature(s) Signed: 12/06/2022 12:54:33 PM By: [name] MD FACS Signed: 12/06/2022 3:56:49 PM By: [name] Previous Signature: 12/06/2022 12:18:10 PM Version By: [name] MD FACS Entered By: [name] on 12/06/2022 12:52:38

## 2022-12-06 NOTE — Progress Notes (Signed)
[name] ([id number]) [id number] Page 1 of 13 Visit Report for 12/06/2022 Arrival Information Details Patient Name: Date of Service: [name] 12/06/2022 11:30 A M Medical Record Number: [id number] Patient Account Number: [id number] Date of Birth/Sex: Treating RN: 1940-07-29 (82 y.o. [name] Primary Care Zi Newbury: [name], [name] Other Clinician: Referring [name]: Treating [name]/Extender: [name]: 25 Visit Information History Since Last Visit Added or deleted any medications: No Patient Arrived: Wheel Chair Any Kathryn allergies or adverse reactions: No Arrival Time: 11:31 Had a fall or experienced change in No Accompanied By: caregiver activities of daily living that may affect Transfer Assistance: Manual risk of falls: Patient Identification Verified: Yes Signs or symptoms of abuse/neglect since last visito No Secondary Verification Process Completed: Yes Hospitalized since last visit: No Patient Requires Transmission-Based Precautions: No Implantable device outside of the clinic excluding No Patient Has Alerts: Yes cellular tissue based products placed in the Joseph Patient Alerts: ABIs: R:0.82 L:0.84 7/24 since last visit: Has Dressing in Place as Prescribed: Yes Has Compression in Place as Prescribed: Yes Pain Present Now: No Electronic Signature(s) Signed: 12/06/2022 3:56:49 PM By: [name] Entered By: [name] on 12/06/2022 11:49:20 -------------------------------------------------------------------------------- Compression Therapy Details Patient Name: Date of Service: [name] [facility] [name] 12/06/2022 11:30 A M Medical Record Number: [id number] Patient Account Number: [id number] Date of Birth/Sex: Treating RN: Feb 10, 1941 (82 y.o. [name] Primary Care Rakesh Dutko: [name], [facility] Other Clinician: Referring Bralynn Velador: Treating Quinlynn Cuthbert/Extender:  [name]: 25 Compression Therapy Performed for Wound Assessment: Wound #11 Right,Anterior Lower Leg Performed By: Clinician [name], RN Compression Type: Three Layer Post Procedure Diagnosis Same as Pre-procedure Electronic Signature(s) Signed: 12/06/2022 3:56:49 PM By: [name] Entered By: [name] on 12/06/2022 12:49:24 [name], [name] ([id number]) [id number] Page 2 of 13 -------------------------------------------------------------------------------- Compression Therapy Details Patient Name: Date of Service: [name] 12/06/2022 11:30 A M Medical Record Number: [id number] Patient Account Number: [id number] Date of Birth/Sex: Treating RN: November 12, 1940 (82 y.o. [name] Primary Care Syana Degraffenreid: [name], [facility] Other Clinician: Referring Mialynn Shelvin: Treating Ezelle Surprenant/Extender: [name] Joseph [name], [name] Weeks in Joseph: 25 Compression Therapy Performed for Wound Assessment: Wound #6 Left,Dorsal Foot Performed By: Clinician [name], RN Compression Type: Three Layer Post Procedure Diagnosis Same as Pre-procedure Electronic Signature(s) Signed: 12/06/2022 3:56:49 PM By: [name] Entered By: [name] on 12/06/2022 12:49:24 -------------------------------------------------------------------------------- Encounter Discharge Information Details Patient Name: Date of Service: [name] [name] 12/06/2022 11:30 A M Medical Record Number: [id number] Patient Account Number: [id number] Date of Birth/Sex: Treating RN: 1940-09-12 (82 y.o. [name] Primary Care Armentha Branagan: [name], [facility] Other Clinician: Referring Gabreil [place]: Treating Debhora Titus/Extender: [name] Joseph [name] Weeks in Joseph: 25 Encounter Discharge Information Items Post Procedure Vitals Discharge Condition: Stable Temperature (F): 98.1 Ambulatory  Status: Wheelchair Pulse (bpm): 87 Discharge Destination: Skilled Nursing Facility Respiratory Rate (breaths/min): 18 Telephoned: No Blood Pressure (mmHg): 106/71 Orders Sent: Yes Transportation: Private Auto Accompanied By: caregiver Schedule Follow-up Appointment: Yes Clinical Summary of Care: Patient Declined Electronic Signature(s) Signed: 12/06/2022 3:56:49 PM By: [name] Entered By: [name] on 12/06/2022 12:53:27 -------------------------------------------------------------------------------- Lower Extremity Assessment Details Patient Name: Date of Service: [name] 12/06/2022 11:30 A M Medical Record Number: [id number] Patient Account Number: [id number] Date of Birth/Sex: Treating RN: 1940-05-30 (82 y.o. [name] Primary Care Raydin Bielinski: [name], [facility] Other Clinician: Referring Macarena Langseth: Treating Cornesha Radziewicz/Extender: Kathryn Gary  [facility] Joseph Joseph, [name] Weeks in Joseph: 25 Edema Assessment Assessed: [Left: No] [Right: No] Edema: [Left: Ye] [Right: s] C[Left: ALDWELL, [place] ([id number])] [Right: [id number] Page 3 of 13] Calf Left: Right: Point of Measurement: From Medial Instep 28.5 cm Ankle Left: Right: Point of Measurement: From Medial Instep 19.5 cm Vascular Assessment Pulses: Dorsalis Pedis Palpable: [Left:Yes] Extremity colors, hair growth, and conditions: Extremity Color: [Left:Hyperpigmented] Hair Growth on Extremity: [Left:No] Temperature of Extremity: [Left:Cool] Capillary Refill: [Left:> 3 seconds] Dependent Rubor: [Left:Yes No] Electronic Signature(s) Signed: 12/06/2022 3:56:49 PM By: [name] Entered By: [name] on 12/06/2022 11:37:17 -------------------------------------------------------------------------------- Multi Wound Chart Details Patient Name: Date of Service: [name], [facility] [name] 12/06/2022 11:30 A M Medical Record Number: [id number] Patient Account  Number: [id number] Date of Birth/Sex: Treating RN: [birth year] (82 y.o. 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, Angina, Hypertension, Anemia, Angina, Hypertension, Anemia, Angina, Hypertension, Comorbid History: Vasculitis, Osteoarthritis Vasculitis, Osteoarthritis Vasculitis, Osteoarthritis 09/05/2022 10/24/2022 11/01/2022 Date Acquired: 13 6 5  Weeks of Joseph: Open Open Open Wound Status: No No No Wound Recurrence: 6x3.4x0.1 0.8x0.7x0.3 0.2x0.3x0.1 Measurements L x W x D (cm) 16.022 0.44 0.047 A (cm) : rea 1.602 0.132 0.005 Volume (cm) : 58.80% -124.50% 93.20% % Reduction in Area: 58.80% -238.50% 92.80% % Reduction in Volume: [name], [name] ([id number]) [id number] Page 4 of 13 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 Full Thickness With Exposed Support Classification: Structures Structures Medium Medium Small Exudate A mount: Serosanguineous Serosanguineous Serosanguineous Exudate Type: red, brown red, brown red, brown Exudate Color: Indistinct, nonvisible Distinct, outline attached Flat and Intact Wound Margin: Medium (34-66%) Small (1-33%) Small (1-33%) Granulation A mount: Red, Pink Red Pink Granulation Quality: Medium (34-66%) Large (67-100%) Large (67-100%) Necrotic A mount: Fat Layer (Subcutaneous Tissue): Yes Fat Layer (Subcutaneous Tissue): Yes Fat Layer (Subcutaneous Tissue): Yes Exposed  Structures: Tendon: Yes Tendon: Yes Bone: Yes Fascia: No Fascia: No Fascia: No Muscle: No Muscle: No Tendon: No Joint: No Joint: No Muscle: No Bone: No Bone: No Joint: No Medium (34-66%) Small (1-33%) Large (67-100%) Epithelialization: Debridement - Selective/Open Wound Debridement - Selective/Open Wound Debridement - Selective/Open Wound Debridement: Pre-procedure Verification/Time Out 11:51 11:51 11:51 Taken: Lidocaine 4% Topical Solution Lidocaine 4% Topical Solution Lidocaine 4% Topical Solution Pain Control: Principal [employer] Tissue Debrided: Non-Viable Tissue Non-Viable Tissue Non-Viable Tissue Level: 11.21 0.44 0.05 Debridement A (sq cm): rea Curette Curette Curette Instrument: Minimum Minimum Minimum Bleeding: Pressure Pressure Pressure Hemostasis A chieved: Procedure was tolerated well Procedure was tolerated well Procedure was tolerated well Debridement Joseph Response: 6x3.4x0.1 0.8x0.7x0.3 0.2x0.3x0.1 Post Debridement Measurements L x W x D (cm) 1.602 0.132 0.005 Post Debridement Volume: (cm) [name] Category/Stage IV [name] Post Debridement Stage: Excoriation: Yes No Abnormalities Noted No Abnormalities Noted Periwound Skin Texture: Maceration: Yes Dry/Scaly: Yes No Abnormalities Noted Periwound Skin Moisture: Rubor: Yes No Abnormalities Noted Rubor: Yes Periwound Skin Color: Cool/Cold No Abnormality Cool/Cold Temperature: Yes Yes Yes Tenderness on Palpation: Debridement Debridement Debridement Procedures Performed: Joseph Notes Electronic Signature(s) Signed: 12/06/2022 12:10:20 PM By: [name] MD FACS Entered By: [name] on 12/06/2022 12:10:20 -------------------------------------------------------------------------------- Multi-Disciplinary Care Plan Details Patient Name: Date of Service:  [name] [name] 12/06/2022 11:30 A M Medical Record Number: [id number] Patient Account Number: [id number] Date of Birth/Sex:  Treating RN: 02/23/1941 (82 y.o. [name] Primary Care Vandy Tsuchiya: [name], [facility] Other Clinician: Referring Shaquana Buel: Treating Taffany Heiser/Extender: [name], [facility] Weeks in Joseph: 25 Multidisciplinary Care Plan reviewed with physician Active Inactive Necrotic Tissue Nursing Diagnoses: Impaired tissue integrity related to necrotic/devitalized tissue Knowledge deficit related to management of necrotic/devitalized tissue Goals: Necrotic/devitalized tissue will be minimized in the wound bed Date Initiated: 06/12/2022 Target Resolution Date: 12/21/2022 Goal Status: Active Patient/caregiver will verbalize understanding of reason and process for debridement of necrotic tissue Date Initiated: 06/12/2022 Target Resolution Date: 12/21/2022 [name], [name] ([id number]) [phone].pdf Page 5 of 13 Goal Status: Active Interventions: Assess patient pain level pre-, during and post procedure and prior to discharge Provide education on necrotic tissue and debridement process Joseph Activities: Apply topical anesthetic as ordered : 06/12/2022 Notes: Wound/Skin Impairment Nursing Diagnoses: Impaired tissue integrity Knowledge deficit related to ulceration/compromised skin integrity Goals: Patient/caregiver will verbalize understanding of skin care regimen Date Initiated: 06/12/2022 Target Resolution Date: 12/21/2022 Goal Status: Active Interventions: Assess ulceration(s) every visit Joseph Activities: Skin care regimen initiated : 06/12/2022 Topical wound management initiated : 06/12/2022 Notes: Electronic Signature(s) Signed: 12/06/2022 3:56:49 PM By: [name] Entered By: [name] on 12/06/2022 11:48:10 -------------------------------------------------------------------------------- Pain Assessment Details Patient Name: Date of Service: [name] 12/06/2022 11:30 A M Medical Record Number:  [id number] Patient Account Number: [id number] Date of Birth/Sex: Treating RN: 03-May-1940 (82 y.o. [name] Primary Care Warnell Rasnic: [name], [facility] Other Clinician: Referring Matthias Bogus: Treating Lee Kalt/Extender: [name], [facility] Weeks in Joseph: 25 Active Problems Location of Pain Severity and Description of Pain Patient Has Paino No Site Locations Rate the pain. Current Pain Level: 0 Pain Management and Medication [name], [name] ([id number]) [id number] Page 6 of 13 Current Pain Management: Electronic Signature(s) Signed: 12/06/2022 3:56:49 PM By: Gelene Mink By: [name] on 12/06/2022 11:32:08 -------------------------------------------------------------------------------- Patient/Caregiver Education Details Patient Name: Date of Service: [name], [name] 8/15/2024andnbsp11:30 A M Medical Record Number: [id number] Patient Account Number: [id number] Date of Birth/Gender: Treating RN: 1941/01/01 (82 y.o. [name] Primary Care Physician: [name], [facility] Other Clinician: Referring Physician: Treating Physician/Extender: [name], [facility] Weeks in Joseph: 25 Education Assessment Education Provided To: Patient Education Topics Provided Wound/Skin Impairment: Methods: Explain/Verbal Responses: Reinforcements needed, State content correctly Electronic Signature(s) Signed: 12/06/2022 3:56:49 PM By: [name] Entered By: [name] on 12/06/2022 11:48:27 -------------------------------------------------------------------------------- Wound Assessment Details Patient Name: Date of Service: [name] [facility] [name] 12/06/2022 11:30 A M Medical Record Number: [id number] Patient Account Number: [id number] Date of Birth/Sex: Treating RN: 05-Sep-1940 (82 y.o. [name] Primary Care Ramsay Bognar: [name], [facility] Other Clinician: Referring Ivania Teagarden: Treating  Peyten Punches/Extender: [name] Joseph [name], [facility] Weeks in Joseph: 25 Wound Status Wound Number: 10 Primary Etiology: Lymphedema Wound Location: Right T Second oe Wound Status: Open Wounding Event: Gradually Appeared Comorbid History: Anemia, Angina, Hypertension, Vasculitis, Osteoarthritis Date Acquired: 09/05/2022 Weeks Of Joseph: 0 Clustered Wound: No Wound Measurements Length: (cm) 0.5 Width: (cm) 0.5 Depth: (cm) 0.1 Area: (cm) 0.196 Volume: (cm) 0.02 % Reduction in Area: % Reduction in Volume: Epithelialization: Small (1-33%) Tunneling: No Undermining: No Wound Description Percle, Rudine ([id number]) Classification: Full Thickness With Exposed Support Structures Wound Margin: Distinct, outline attached Exudate Amount: Medium Exudate Type: Serosanguineous Exudate Color: red, brown [id number] Page 7 of 13 Foul Odor After Cleansing: No  Slough/Fibrino Yes Wound Bed Granulation Amount: Small (1-33%) Exposed Structure Granulation Quality: Red Fascia Exposed: No Necrotic Amount: Large (67-100%) Fat Layer (Subcutaneous Tissue) Exposed: Yes Necrotic Quality: Adherent Slough Tendon Exposed: No Muscle Exposed: No Joint Exposed: No Bone Exposed: Yes Periwound Skin Texture Texture Color No Abnormalities Noted: Yes No Abnormalities Noted: Yes Moisture Temperature / Pain No Abnormalities Noted: Yes Temperature: No Abnormality Tenderness on Palpation: Yes Electronic Signature(s) Signed: 12/06/2022 3:56:49 PM By: [name] Entered By: [name] on 12/06/2022 12:47:48 -------------------------------------------------------------------------------- Wound Assessment Details Patient Name: Date of Service: [name] [facility] [name] 12/06/2022 11:30 A M Medical Record Number: [id number] Patient Account Number: [id number] Date of Birth/Sex: Treating RN: 1940-07-01 (82 y.o. [name] Primary Care Aashritha Miedema: [name], [facility]  Other Clinician: Referring Cuba Natarajan: Treating Deavin Forst/Extender: [name] Joseph [name], [facility] Weeks in Joseph: 25 Wound Status Wound Number: 11 Primary Etiology: Lymphedema Wound Location: Right Lower Leg Wound Status: Open Wounding Event: Gradually Appeared Comorbid History: Anemia, Angina, Hypertension, Vasculitis, Osteoarthritis Date Acquired: 12/06/2022 Weeks Of Joseph: 0 Clustered Wound: No Wound Measurements Length: (cm) 1 Width: (cm) 1 Depth: (cm) 0.1 Area: (cm) 0.785 Volume: (cm) 0.079 % Reduction in Area: % Reduction in Volume: Epithelialization: None Tunneling: No Undermining: No Wound Description Classification: 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 Support Structures Wound Margin: Distinct, outline attached Exudate Amount: Medium Exudate Type: Serosanguineous Exudate Color: red, brown Foul Odor After Cleansing: No Slough/Fibrino Yes Wound Bed Granulation Amount: Large (67-100%) Exposed Structure Granulation Quality: Red Fascia Exposed: No Necrotic Amount: Small (1-33%) Fat Layer (Subcutaneous Tissue) Exposed: Yes Necrotic Quality: Adherent Slough Tendon Exposed: No Muscle Exposed: No Joint Exposed: No [name], [name] ([id number]) [id number] Page 8 of 13 Bone Exposed: No Periwound Skin Texture Texture Color No Abnormalities Noted: Yes No Abnormalities Noted: Yes Moisture Temperature / Pain No Abnormalities Noted: No Temperature: No Abnormality Dry / Scaly: Yes Electronic Signature(s) Signed: 12/06/2022 3:56:49 PM By: [name] Entered By: [name] on 12/06/2022 12:48:48 -------------------------------------------------------------------------------- Wound Assessment Details Patient Name: Date of Service: [name] [facility] [name] 12/06/2022 11:30 A M Medical Record Number: [id number] Patient Account Number: [id number] Date of Birth/Sex: Treating RN: [birth year] (82 y.o. [name] Primary Care  Abryanna Musolino: [name], [facility] Other Clinician: Referring Gurtaj Ruz: Treating Joniqua Sidle/Extender: [name] Joseph [name], [facility] Weeks in Joseph: 25 Wound Status Wound Number: 6 Primary Etiology: Lymphedema Wound Location: Left, Dorsal Foot Wound Status: Open Wounding Event: Gradually Appeared Comorbid History: Anemia, Angina, Hypertension, Vasculitis, Osteoarthritis Date Acquired: 09/05/2022 Weeks Of Joseph: 13 Clustered Wound: No Photos Wound Measurements Length: (cm) 3 Width: (cm) 3 Depth: (cm) 0.1 Area: (cm) 7.069 Volume: (cm) 0.707 % Reduction in Area: 81.8% % Reduction in Volume: 81.8% Epithelialization: Medium (34-66%) Tunneling: No Undermining: No Wound Description Classification: 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 Support Structures Wound Margin: Indistinct, nonvisible Exudate Amount: Medium Exudate Type: Serosanguineous Exudate Color: red, brown Foul Odor After Cleansing: No Slough/Fibrino Yes Wound Bed Granulation Amount: Medium (34-66%) Exposed Structure Granulation Quality: Red, Pink Fascia Exposed: No Necrotic Amount: Medium (34-66%) Fat Layer (Subcutaneous Tissue) Exposed: Yes Necrotic Quality: Adherent Slough Tendon Exposed: No Muscle Exposed: No Joint Exposed: No [name], [name] ([id number]) [id number] Page 9 of 13 Bone Exposed: No Periwound Skin Texture Texture Color No Abnormalities Noted: Yes No Abnormalities Noted: No Rubor: Yes Moisture No Abnormalities Noted: Yes Temperature / Pain Temperature: Cool/Cold Tenderness on Palpation: Yes Joseph Notes Wound #6 (Foot) Wound Laterality: Dorsal, Left Cleanser Soap and Water Discharge Instruction: May shower and wash wound with dial antibacterial soap and  water prior to dressing change. Wound Cleanser Discharge Instruction: Cleanse the wound with wound cleanser prior to applying a clean dressing using gauze sponges, not tissue or cotton balls. Peri-Wound Care Sween  Lotion (Moisturizing lotion) Discharge Instruction: Apply moisturizing lotion as directed Topical Gentamicin Discharge Instruction: As directed by physician Mupirocin Ointment Discharge Instruction: Apply Mupirocin (Bactroban) as instructed Ketoconazole Cream 2% Discharge Instruction: Apply Ketoconazole as directed Triamcinolone Discharge Instruction: Apply Triamcinolone as directed zinc Primary Dressing Maxorb Extra Ag+ Alginate Dressing, 4x4.75 (in/in) Discharge Instruction: Apply to wound bed as instructed Secondary Dressing ABD Pad, 8x10 Discharge Instruction: Apply over primary dressing as directed. Drawtex 4x4 in Discharge Instruction: Apply over primary dressing as directed. Woven Gauze Sponge, Non-Sterile 4x4 in Discharge Instruction: can use gauze between toes (if no desired) Zetuvit Plus 4x8 in Discharge Instruction: Apply over primary dressing as directed. Secured With Compression Wrap Urgo K2 Lite, (equivalent to a 3 layer) two layer compression system, regular Discharge Instruction: Apply Urgo K2 Lite as directed (alternative to 3 layer compression). Compression Stockings Add-Ons Electronic Signature(s) Signed: 12/06/2022 3:56:49 PM By: [name] Entered By: [name] on 12/06/2022 12:46:03 [name], [name] ([id number]) [id number] Page 10 of 13 -------------------------------------------------------------------------------- Wound Assessment Details Patient Name: Date of Service: [name] 12/06/2022 11:30 A M Medical Record Number: [id number] Patient Account Number: [id number] Date of Birth/Sex: Treating RN: 12/30/1940 (82 y.o. [name] Primary Care Helga Asbury: [name], [facility] Other Clinician: Referring Tamla Winkels: Treating Smriti Barkow/Extender: [name] Joseph [name], [facility] Weeks in Joseph: 25 Wound Status Wound Number: 8 Primary Etiology: Pressure Ulcer Wound Location: Left, Lateral Foot Wound  Status: Open Wounding Event: Pressure Injury Comorbid History: Anemia, Angina, Hypertension, Vasculitis, Osteoarthritis Date Acquired: 10/24/2022 Weeks Of Joseph: 6 Clustered Wound: No Photos Wound Measurements Length: (cm) 0.8 Width: (cm) 0.7 Depth: (cm) 0.3 Area: (cm) 0.44 Volume: (cm) 0.132 % Reduction in Area: -124.5% % Reduction in Volume: -238.5% Epithelialization: Small (1-33%) Tunneling: No Undermining: No Wound Description Classification: Category/Stage IV Wound Margin: Distinct, outline attached Exudate Amount: Medium Exudate Type: Serosanguineous Exudate Color: red, brown Foul Odor After Cleansing: No Slough/Fibrino Yes Wound Bed Granulation Amount: Small (1-33%) Exposed Structure Granulation Quality: Red Fascia Exposed: No Necrotic Amount: Large (67-100%) Fat Layer (Subcutaneous Tissue) Exposed: Yes Necrotic Quality: Adherent Slough Tendon Exposed: Yes Muscle Exposed: No Joint Exposed: No Bone Exposed: No Periwound Skin Texture Texture Color No Abnormalities Noted: Yes No Abnormalities Noted: Yes Moisture Temperature / Pain No Abnormalities Noted: Yes Temperature: No Abnormality Tenderness on Palpation: Yes Joseph Notes Wound #8 (Foot) Wound Laterality: Left, Lateral Cleanser Soap and Water Discharge Instruction: May shower and wash wound with dial antibacterial soap and water prior to dressing change. Wound Cleanser Discharge Instruction: Cleanse the wound with wound cleanser prior to applying a clean dressing using gauze sponges, not tissue or cotton balls. Peri-Wound Care [name], [name] ([id number]) [id number] Page 11 of 13 Sween Lotion (Moisturizing lotion) Discharge Instruction: Apply moisturizing lotion as directed Topical Gentamicin Discharge Instruction: As directed by physician Mupirocin Ointment Discharge Instruction: Apply Mupirocin (Bactroban) as instructed Ketoconazole Cream 2% Discharge  Instruction: Apply Ketoconazole as directed Triamcinolone Discharge Instruction: Apply Triamcinolone as directed zinc Primary Dressing Endoform 2x2 in Discharge Instruction: Moisten with saline Secondary Dressing ABD Pad, 8x10 Discharge Instruction: Apply over primary dressing as directed. Drawtex 4x4 in Discharge Instruction: Apply over primary dressing as directed. Woven Gauze Sponge, Non-Sterile 4x4 in Discharge Instruction: can use gauze between toes (if no desired) Zetuvit Plus 4x8 in Discharge Instruction: Apply over primary dressing  as directed. Secured With Compression Wrap Urgo K2 Lite, (equivalent to a 3 layer) two layer compression system, regular Discharge Instruction: Apply Urgo K2 Lite as directed (alternative to 3 layer compression). Compression Stockings Add-Ons Electronic Signature(s) Signed: 12/06/2022 3:56:49 PM By: [name] Entered By: [name] on 12/06/2022 11:53:14 -------------------------------------------------------------------------------- Wound Assessment Details Patient Name: Date of Service: [name] 12/06/2022 11:30 A M Medical Record Number: [id number] Patient Account Number: [id number] Date of Birth/Sex: Treating RN: 03-Jul-1940 (82 y.o. [name] Primary Care Liston Thum: [name], [facility] Other Clinician: Referring Licia Harl: Treating Key Cen/Extender: [name] Joseph Joseph, [facility] Joseph: Lambert Rhonda W Weeks in Joseph: 25 Wound Status Wound Number: 9 Primary Etiology: Auto-immune Wound Location: Left, Lateral T Great oe Wound Status: Open Wounding Event: Gradually Appeared Comorbid History: Anemia, Angina, Hypertension, Vasculitis, Osteoarthritis Date Acquired: 11/01/2022 Weeks Of Joseph: 5 Clustered Wound: No Photos [name], [name] ([id number]) [id number] Page 12 of 13 Wound Measurements Length: (cm) 0.2 Width: (cm) 0.3 Depth: (cm) 0.1 Area: (cm) 0.047 Volume: (cm) 0.005 % Reduction  in Area: 93.2% % Reduction in Volume: 92.8% Epithelialization: Large (67-100%) Tunneling: No Undermining: No Wound Description Classification: Full Thickness With Exposed Support Structures Wound Margin: Flat and Intact Exudate Amount: Small Exudate Type: Serosanguineous Exudate Color: red, brown Foul Odor After Cleansing: No Slough/Fibrino Yes Wound Bed Granulation Amount: Small (1-33%) Exposed Structure Granulation Quality: Pink Fascia Exposed: No Necrotic Amount: Large (67-100%) Fat Layer (Subcutaneous Tissue) Exposed: Yes Necrotic Quality: Adherent Slough Tendon Exposed: No Muscle Exposed: No Joint Exposed: No Bone Exposed: Yes Periwound Skin Texture Texture Color No Abnormalities Noted: Yes No Abnormalities Noted: No Rubor: Yes Moisture No Abnormalities Noted: Yes Temperature / Pain Temperature: Cool/Cold Tenderness on Palpation: Yes Joseph Notes Wound #9 (Toe Great) Wound Laterality: Left, Lateral Cleanser Soap and Water Discharge Instruction: May shower and wash wound with dial antibacterial soap and water prior to dressing change. Wound Cleanser Discharge Instruction: Cleanse the wound with wound cleanser prior to applying a clean dressing using gauze sponges, not tissue or cotton balls. Peri-Wound Care Sween Lotion (Moisturizing lotion) Discharge Instruction: Apply moisturizing lotion as directed Topical Gentamicin Discharge Instruction: As directed by physician Mupirocin Ointment Discharge Instruction: Apply Mupirocin (Bactroban) as instructed Ketoconazole Cream 2% Discharge Instruction: Apply Ketoconazole as directed Triamcinolone Discharge Instruction: Apply Triamcinolone as directed zinc Primary Dressing Endoform 2x2 in Discharge Instruction: Moisten with saline [name], Shelbie ([id number]) [id number] Page 13 of 13 Secondary Dressing ABD Pad, 8x10 Discharge Instruction: Apply over primary dressing as  directed. Drawtex 4x4 in Discharge Instruction: Apply over primary dressing as directed. Woven Gauze Sponge, Non-Sterile 4x4 in Discharge Instruction: can use gauze between toes (if no desired) Zetuvit Plus 4x8 in Discharge Instruction: Apply over primary dressing as directed. Secured With Compression Wrap Urgo K2 Lite, (equivalent to a 3 layer) two layer compression system, regular Discharge Instruction: Apply Urgo K2 Lite as directed (alternative to 3 layer compression). Compression Stockings Add-Ons Electronic Signature(s) Signed: 12/06/2022 3:56:49 PM By: [name] Entered By: [name] on 12/06/2022 11:55:21 -------------------------------------------------------------------------------- Vitals Details Patient Name: Date of Service: [name], [facility] [name] 12/06/2022 11:30 A M Medical Record Number: [id number] Patient Account Number: [id number] Date of Birth/Sex: Treating RN: 05/22/1940 (82 y.o. [name] Primary Care Eufemia Prindle: [name], [facility] Other Clinician: Referring Dorthy Magnussen: Treating Christabel Camire/Extender: [name] Joseph [name], [facility] Weeks in Joseph: 25 Vital Signs Time Taken: 11:31 Temperature (F): 98.1 Height (in): 67 Pulse (bpm): 87 Weight (lbs): 153 Respiratory Rate (breaths/min): 18 Body Mass Index (BMI): 24 Blood Pressure (mmHg):  106/71 Reference Range: 80 - 120 mg / dl Electronic Signature(s) Signed: 12/06/2022 3:56:49 PM By: [name] Entered By: [name] on 12/06/2022 11:32:03

## 2022-12-10 NOTE — Progress Notes (Unsigned)
[facility] [place]  ASSESSMENT / PLAN: [name] is a 82 y.o. female with atherosclerosis of native arteries of left lower extremity causing ulceration.  Recommend:  Abstinence from all tobacco products. Blood glucose control with goal A1c < 7%. Blood pressure control with goal blood pressure < 140/90 mmHg. Lipid reduction therapy with goal LDL-C <100 mg/dL  Aspirin 81mg  PO QD.  Atorvastatin 40-80mg  PO QD (or other "high intensity" statin therapy).  The patient is on best medical therapy for peripheral arterial disease. The patient has been counseled about the risks of tobacco use in atherosclerotic disease. The patient has been counseled to abstain from any tobacco use. An aortogram with bilateral lower extremity runoff angiography and Left lower extremity intervention and is indicated to better evaluate the patient's lower extremity circulation because of the  limb threatening nature of the patient's diagnosis. Based on the patient's clinical exam and non-invasive data, we anticipate an endovascular intervention in the femoropopliteal and tibial vessels. Stenting and/or athrectomy would be favored because of the improved primary patency of these interventions as compared to plain balloon angioplasty.  CHIEF COMPLAINT: left foot ulceration  HISTORY OF PRESENT ILLNESS: [name] is a 82 y.o. female referred to clinic for evaluation of nonhealing ulceration of the left distal foot and toes.  The patient is a nursing home resident.  She is ambulatory with assistance.  She has had a left distal, dorsal, foot ulcer present for many months.  She has seen some progress with diligent local wound care, but has never completely healed.  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   Vision abnormalities    as result of stroke   Xerophthalmia     Past Surgical History:  Procedure Laterality Date   CHOLECYSTECTOMY     leg surgery     TONSILLECTOMY      Family History  Family history unknown: Yes    Social History   Socioeconomic History   Marital status: Legally Separated    Spouse name: Not on file   Number of children: 2   Years of education: 16   Highest education level: Not on file  Occupational History    Comment: retired  Tobacco Use   Smoking status: Former   Smokeless tobacco: Never  Advertising account planner   Vaping status: Never Used  Substance and Sexual Activity   Alcohol use: No   Drug use: No   Sexual activity: Not on file  Other Topics Concern   Not on file  Social History Narrative   01/22/17 lives at [facility]   Social Determinants of Health   Financial Resource Strain: Not on file  Food Insecurity: No Food Insecurity (06/27/2022)   Hunger Vital Sign    Worried About Running Out of Food in the Last Year: Never true    Ran Out of Food in the Last Year: Never true  Transportation Needs: No Transportation Needs (06/27/2022)   PRAPARE - Administrator, Civil Service (Medical): No    Lack of Transportation (Non-Medical): No  Physical Activity: Not on file  Stress: Not on file  Social Connections: Not on file  Intimate Partner Violence: Not At  Risk (06/27/2022)   Humiliation, Afraid, Rape, and Kick questionnaire    Fear of Current or Ex-Partner: No    Emotionally Abused: No    Physically Abused: No    Sexually Abused: No    Allergies  Allergen Reactions   Sulfa Antibiotics Other (See Comments)    Reaction     Current Outpatient Medications  Medication Sig Dispense Refill   apixaban (ELIQUIS) 2.5 MG TABS tablet Take 1 tablet (2.5 mg total) by mouth 2 (two) times daily. 60 tablet 0   aspirin EC 81 MG tablet Take 1 tablet (81 mg total) by mouth daily. 30  tablet 11   atorvastatin (LIPITOR) 20 MG tablet Take 20 mg by mouth daily.     calcium carbonate (TUMS - DOSED IN MG ELEMENTAL CALCIUM) 500 MG chewable tablet Chew 1 tablet by mouth 2 (two) times daily with a meal.     clobetasol (TEMOVATE) 0.05 % external solution Apply 1 Application topically every other day.     escitalopram (LEXAPRO) 5 MG tablet Take 2.5 mg by mouth at bedtime.     ferrous sulfate 325 (65 FE) MG tablet Take 325 mg by mouth 3 (three) times daily with meals.     levothyroxine (SYNTHROID) 112 MCG tablet Take 112 mcg by mouth daily before breakfast.     Menthol, Topical Analgesic, (BIOFREEZE) 4 % GEL Apply 1 application topically See admin instructions. Apply to right shoulder twice a day     metoprolol tartrate (LOPRESSOR) 50 MG tablet Take 1 tablet (50 mg total) by mouth every 8 (eight) hours. 90 tablet 0   mirtazapine (REMERON) 15 MG tablet Take 15 mg by mouth at bedtime.     mupirocin ointment (BACTROBAN) 2 % Apply 1 Application topically daily. 30 g 2   omeprazole (PRILOSEC) 20 MG capsule Take 20 mg by mouth daily.     polyethylene glycol (MIRALAX / GLYCOLAX) 17 g packet Take 17 g by mouth daily as needed for mild constipation or moderate constipation. 14 each 0   promethazine (PHENERGAN) 12.5 MG tablet Take 12.5 mg by mouth daily as needed for nausea or vomiting.     Propylene Glycol (SYSTANE BALANCE OP) Place 1 drop into both eyes 2 (two) times daily.     vitamin B-12 (CYANOCOBALAMIN) 500 MCG tablet Take 0.5 tablets (250 mcg total) by mouth daily. 30 tablet 0   No current facility-administered medications for this visit.    PHYSICAL EXAM Vitals:   12/11/22 0853  BP: 114/74  Pulse: 74  Resp: 20  Temp: 98.5 F (36.9 C)  SpO2: 91%  Weight: 121 lb (54.9 kg)  Height: 5\' 7"  (1.702 m)    Elderly woman in no distress In a wheelchair Right femoral pulse palpable Femoral pulse not palpable No palpable popliteal pulses bilaterally No palpable pedal pulses  bilaterally Multifocal ulceration of the left distal foot and toes   PERTINENT LABORATORY AND RADIOLOGIC DATA  Most recent CBC    Latest Ref Rng & Units 06/28/2022    4:31 AM 06/27/2022   11:59 AM 12/22/2021    3:06 PM  CBC  WBC 4.0 - 10.5 K/uL 7.4  8.9    Hemoglobin 12.0 - 15.0 g/dL 9.0  16.1  09.6   Hematocrit 36.0 - 46.0 % 30.3  32.7  33.1   Platelets 150 - 400 K/uL 210  264       Most recent CMP    Latest Ref Rng & Units 07/03/2022   10:16 AM 06/30/2022  4:30 AM 06/29/2022    4:44 AM  CMP  Glucose 70 - 99 mg/dL 782  956    BUN 8 - 23 mg/dL 23  24    Creatinine 2.13 - 1.00 mg/dL 0.86  5.78  4.69   Sodium 135 - 145 mmol/L 136  136    Potassium 3.5 - 5.1 mmol/L 4.0  3.8    Chloride 98 - 111 mmol/L 103  107    CO2 22 - 32 mmol/L 25  22    Calcium 8.9 - 10.3 mg/dL 8.0  8.2       +-------+-----------+-----------+------------+------------+  ABI/TBIToday's ABIToday's TBIPrevious ABIPrevious TBI  +-------+-----------+-----------+------------+------------+  Right 0.82       0.18                                 +-------+-----------+-----------+------------+------------+  Left  0.84                                            +-------+-----------+-----------+------------+------------+   Left: Patent lower extremity without obvious evidence of stenosis, however  portions of this exam were limited due to bandages/wraps.    Rande Brunt. Lenell Antu, MD FACS [facility] Phone Number: [phone] 12/11/2022 10:51 AM   Total time spent on preparing this encounter including chart review, data review, collecting history, examining the patient, coordinating care for this new patient, 60 minutes.  Portions of this report may have been transcribed using voice recognition software.  Every effort has been made to ensure accuracy; however, inadvertent computerized transcription errors may still be present.

## 2022-12-10 NOTE — H&P (View-Only) (Signed)
[facility] [place]  ASSESSMENT / PLAN: [name] is a 82 y.o. female with atherosclerosis of native arteries of left lower extremity causing ulceration.  Recommend:  Abstinence from all tobacco products. Blood glucose control with goal A1c < 7%. Blood pressure control with goal blood pressure < 140/90 mmHg. Lipid reduction therapy with goal LDL-C <100 mg/dL  Aspirin 81mg  PO QD.  Atorvastatin 40-80mg  PO QD (or other "high intensity" statin therapy).  The patient is on best medical therapy for peripheral arterial disease. The patient has been counseled about the risks of tobacco use in atherosclerotic disease. The patient has been counseled to abstain from any tobacco use. An aortogram with bilateral lower extremity runoff angiography and Left lower extremity intervention and is indicated to better evaluate the patient's lower extremity circulation because of the  limb threatening nature of the patient's diagnosis. Based on the patient's clinical exam and non-invasive data, we anticipate an endovascular intervention in the femoropopliteal and tibial vessels. Stenting and/or athrectomy would be favored because of the improved primary patency of these interventions as compared to plain balloon angioplasty.  CHIEF COMPLAINT: left foot ulceration  HISTORY OF PRESENT ILLNESS: [name] is a 82 y.o. female referred to clinic for evaluation of nonhealing ulceration of the left distal foot and toes.  The patient is a nursing home resident.  She is ambulatory with assistance.  She has had a left distal, dorsal, foot ulcer present for many months.  She has seen some progress with diligent local wound care, but has never completely healed.  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   Vision abnormalities    as result of stroke   Xerophthalmia     Past Surgical History:  Procedure Laterality Date   CHOLECYSTECTOMY     leg surgery     TONSILLECTOMY      Family History  Family history unknown: Yes    Social History   Socioeconomic History   Marital status: Legally Separated    Spouse name: Not on file   Number of children: 2   Years of education: 16   Highest education level: Not on file  Occupational History    Comment: retired  Tobacco Use   Smoking status: Former   Smokeless tobacco: Never  Advertising account planner   Vaping status: Never Used  Substance and Sexual Activity   Alcohol use: No   Drug use: No   Sexual activity: Not on file  Other Topics Concern   Not on file  Social History Narrative   01/22/17 lives at [facility]   Social Determinants of Health   Financial Resource Strain: Not on file  Food Insecurity: No Food Insecurity (06/27/2022)   Hunger Vital Sign    Worried About Running Out of Food in the Last Year: Never true    Ran Out of Food in the Last Year: Never true  Transportation Needs: No Transportation Needs (06/27/2022)   PRAPARE - Administrator, Civil Service (Medical): No    Lack of Transportation (Non-Medical): No  Physical Activity: Not on file  Stress: Not on file  Social Connections: Not on file  Intimate Partner Violence: Not At  Risk (06/27/2022)   Humiliation, Afraid, Rape, and Kick questionnaire    Fear of Current or Ex-Partner: No    Emotionally Abused: No    Physically Abused: No    Sexually Abused: No    Allergies  Allergen Reactions   Sulfa Antibiotics Other (See Comments)    Reaction     Current Outpatient Medications  Medication Sig Dispense Refill   apixaban (ELIQUIS) 2.5 MG TABS tablet Take 1 tablet (2.5 mg total) by mouth 2 (two) times daily. 60 tablet 0   aspirin EC 81 MG tablet Take 1 tablet (81 mg total) by mouth daily. 30  tablet 11   atorvastatin (LIPITOR) 20 MG tablet Take 20 mg by mouth daily.     calcium carbonate (TUMS - DOSED IN MG ELEMENTAL CALCIUM) 500 MG chewable tablet Chew 1 tablet by mouth 2 (two) times daily with a meal.     clobetasol (TEMOVATE) 0.05 % external solution Apply 1 Application topically every other day.     escitalopram (LEXAPRO) 5 MG tablet Take 2.5 mg by mouth at bedtime.     ferrous sulfate 325 (65 FE) MG tablet Take 325 mg by mouth 3 (three) times daily with meals.     levothyroxine (SYNTHROID) 112 MCG tablet Take 112 mcg by mouth daily before breakfast.     Menthol, Topical Analgesic, (BIOFREEZE) 4 % GEL Apply 1 application topically See admin instructions. Apply to right shoulder twice a day     metoprolol tartrate (LOPRESSOR) 50 MG tablet Take 1 tablet (50 mg total) by mouth every 8 (eight) hours. 90 tablet 0   mirtazapine (REMERON) 15 MG tablet Take 15 mg by mouth at bedtime.     mupirocin ointment (BACTROBAN) 2 % Apply 1 Application topically daily. 30 g 2   omeprazole (PRILOSEC) 20 MG capsule Take 20 mg by mouth daily.     polyethylene glycol (MIRALAX / GLYCOLAX) 17 g packet Take 17 g by mouth daily as needed for mild constipation or moderate constipation. 14 each 0   promethazine (PHENERGAN) 12.5 MG tablet Take 12.5 mg by mouth daily as needed for nausea or vomiting.     Propylene Glycol (SYSTANE BALANCE OP) Place 1 drop into both eyes 2 (two) times daily.     vitamin B-12 (CYANOCOBALAMIN) 500 MCG tablet Take 0.5 tablets (250 mcg total) by mouth daily. 30 tablet 0   No current facility-administered medications for this visit.    PHYSICAL EXAM Vitals:   12/11/22 0853  BP: 114/74  Pulse: 74  Resp: 20  Temp: 98.5 F (36.9 C)  SpO2: 91%  Weight: 121 lb (54.9 kg)  Height: 5\' 7"  (1.702 m)    Elderly woman in no distress In a wheelchair Right femoral pulse palpable Femoral pulse not palpable No palpable popliteal pulses bilaterally No palpable pedal pulses  bilaterally Multifocal ulceration of the left distal foot and toes   PERTINENT LABORATORY AND RADIOLOGIC DATA  Most recent CBC    Latest Ref Rng & Units 06/28/2022    4:31 AM 06/27/2022   11:59 AM 12/22/2021    3:06 PM  CBC  WBC 4.0 - 10.5 K/uL 7.4  8.9    Hemoglobin 12.0 - 15.0 g/dL 9.0  16.1  09.6   Hematocrit 36.0 - 46.0 % 30.3  32.7  33.1   Platelets 150 - 400 K/uL 210  264       Most recent CMP    Latest Ref Rng & Units 07/03/2022   10:16 AM 06/30/2022  4:30 AM 06/29/2022    4:44 AM  CMP  Glucose 70 - 99 mg/dL 469  629    BUN 8 - 23 mg/dL 23  24    Creatinine 5.28 - 1.00 mg/dL 4.13  2.44  0.10   Sodium 135 - 145 mmol/L 136  136    Potassium 3.5 - 5.1 mmol/L 4.0  3.8    Chloride 98 - 111 mmol/L 103  107    CO2 22 - 32 mmol/L 25  22    Calcium 8.9 - 10.3 mg/dL 8.0  8.2       +-------+-----------+-----------+------------+------------+  ABI/TBIToday's ABIToday's TBIPrevious ABIPrevious TBI  +-------+-----------+-----------+------------+------------+  Right 0.82       0.18                                 +-------+-----------+-----------+------------+------------+  Left  0.84                                            +-------+-----------+-----------+------------+------------+   Left: Patent lower extremity without obvious evidence of stenosis, however  portions of this exam were limited due to bandages/wraps.    Rande Brunt. Lenell Antu, MD FACS [facility] Phone Number: [phone] 12/11/2022 10:51 AM   Total time spent on preparing this encounter including chart review, data review, collecting history, examining the patient, coordinating care for this new patient, 60 minutes.  Portions of this report may have been transcribed using voice recognition software.  Every effort has been made to ensure accuracy; however, inadvertent computerized transcription errors may still be present.

## 2022-12-11 ENCOUNTER — Other Ambulatory Visit: Payer: Self-pay

## 2022-12-11 ENCOUNTER — Ambulatory Visit (INDEPENDENT_AMBULATORY_CARE_PROVIDER_SITE_OTHER): Payer: Medicare Other | Admitting: Vascular Surgery

## 2022-12-11 ENCOUNTER — Encounter: Payer: Self-pay | Admitting: Vascular Surgery

## 2022-12-11 VITALS — BP 114/74 | HR 74 | Temp 98.5°F | Resp 20 | Ht 67.0 in | Wt 121.0 lb

## 2022-12-11 DIAGNOSIS — I70245 Atherosclerosis of native arteries of left leg with ulceration of other part of foot: Secondary | ICD-10-CM | POA: Diagnosis not present

## 2022-12-13 ENCOUNTER — Encounter (HOSPITAL_BASED_OUTPATIENT_CLINIC_OR_DEPARTMENT_OTHER): Payer: Medicare Other | Admitting: General Surgery

## 2022-12-13 DIAGNOSIS — I48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 atrial fibrillation: Secondary | ICD-10-CM | POA: Diagnosis not present

## 2022-12-13 NOTE — Progress Notes (Signed)
PARMIS, FARAG ([id number]) [id number] Page 1 of 1 Visit Report for 12/13/2022 SuperBill Details Patient Name: Date of Service: [name] 12/13/2022 Medical Record Number: [id number] Patient Account Number: [id number] Date of Birth/Sex: Treating RN: 1941-01-01 (82 y.o. [name] Primary Care Provider: [name], [facility] Other Clinician: Referring Provider: Treating Provider/Extender: [name], [facility] Weeks in Treatment: 26 Diagnosis Coding ICD-10 Codes Code Description [id number] Non-pressure chronic ulcer of other part of left foot with fat layer exposed L97.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 without evidence of necrosis L97.525 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 without evidence of necrosis L97.812 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 ICD-10 Diagnosis Description L97.522 Non-pressure chronic ulcer of other part of left foot with fat layer exposed L97.812 Non-pressure chronic ulcer of other part of right lower leg with fat layer exposed Electronic Signature(s) Signed: 12/13/2022 3:57:51 PM By: [name] Signed: 12/13/2022 4:10:53 PM By: [name] MD FACS Entered By: [name] on 12/13/2022 12:57:24

## 2022-12-13 NOTE — Progress Notes (Signed)
MAELANI, JAMES ([id number]) [id number] Page 1 of 10 Visit Report for 12/13/2022 Arrival Information Details Patient Name: Date of Service: [name] RO N 12/13/2022 3:00 PM Medical Record Number: [id number] Patient Account Number: [id number] Date of Birth/Sex: Treating RN: Nov 22, 1940 (82 y.o. [name] Primary Care Kristeena Meineke: [name], [facility] Other Clinician: Referring Sheyla Zaffino: Treating Druanne Bosques/Extender: [name], [facility] Weeks in Treatment: 26 Visit Information History Since Last Visit Added or deleted any medications: No Patient Arrived: Wheel Chair Any new allergies or adverse reactions: No Arrival Time: 15:55 Had a fall or experienced change in No Accompanied By: caregiver activities of daily living that may affect Transfer Assistance: Manual risk of falls: Patient Identification Verified: Yes Signs or symptoms of abuse/neglect since last visito No Secondary Verification Process Completed: Yes Hospitalized since last visit: No Patient Requires Transmission-Based No Implantable device outside of the clinic excluding No Precautions: cellular tissue based products placed in the center Patient Has Alerts: Yes since last visit: Patient Alerts: ABIs: R:0.82 L:0.84 7/24 Has Dressing in Place as Prescribed: Yes Has Compression in Place as Prescribed: Yes Pain Present Now: No Electronic Signature(s) Signed: 12/13/2022 3:57:51 PM By: [name] Entered By: [name] on 12/13/2022 12:55:39 -------------------------------------------------------------------------------- Compression Therapy Details Patient Name: Date of Service: [name] [facility] RO N 12/13/2022 3:00 PM Medical Record Number: [id number] Patient Account Number: [id number] Date of Birth/Sex: Treating RN: 1940/12/24 (82 y.o. [name] Primary Care Versa Craton: [name], [facility] Other Clinician: Referring Eural Holzschuh: Treating Courtlyn Aki/Extender:  [name], [facility] Weeks in Treatment: 26 Compression Therapy Performed for Wound Assessment: Wound #11 Right,Anterior Lower Leg Performed By: Clinician [name], RN Compression Type: Double Layer Electronic Signature(s) Signed: 12/13/2022 3:57:51 PM By: [name] Entered By: [name] on 12/13/2022 12:56:37 -------------------------------------------------------------------------------- Compression Therapy Details Patient Name: Date of Service: [name] [facility] RO N 12/13/2022 3:00 PM Medical Record Number: [id number] Patient Account Number: [id number] [name], [name] ([id number]) [id number].pdf Page 2 of 10 Date of Birth/Sex: Treating RN: [birth year] (82 y.o. [name] Primary Care Ceria Suminski: [name], [facility] Other Clinician: Referring Taran Hable: Treating Earline Stiner/Extender: [name] SHO [name], [facility] Weeks in Treatment: 26 Compression Therapy Performed for Wound Assessment: Wound #6 Left,Dorsal Foot Performed By: Clinician [name], RN Compression Type: Double Layer Electronic Signature(s) Signed: 12/13/2022 3:57:51 PM By: Gelene Mink By: [name] on 12/13/2022 12:56:37 -------------------------------------------------------------------------------- Encounter Discharge Information Details Patient Name: Date of Service: [name], [facility] RO N 12/13/2022 3:00 PM Medical Record Number: [id number] Patient Account Number: [id number] Date of Birth/Sex: Treating RN: 1940/11/07 (82 y.o. [name] Primary Care Reeder Brisby: [name], [facility] Other Clinician: Referring Joneisha Miles: Treating Denishia Citro/Extender: [name] SHO [name], [facility] Weeks in Treatment: 98 Encounter Discharge Information Items Discharge Condition: Stable Ambulatory Status: Wheelchair Discharge Destination: Skilled Nursing Facility Telephoned: No Orders Sent: No Transportation: Private Auto Accompanied By:  caregiver Schedule Follow-up Appointment: Yes Clinical Summary of Care: Patient Declined Electronic Signature(s) Signed: 12/13/2022 3:57:51 PM By: Gelene Mink By: [name] on 12/13/2022 12:57:12 -------------------------------------------------------------------------------- Patient/Caregiver Education Details Patient Name: Date of Service: [name] RO N 8/22/2024andnbsp3:00 PM Medical Record Number: [id number] Patient Account Number: [id number] Date of Birth/Gender: Treating RN: Sep 19, 1940 (82 y.o. [name] Primary Care Physician: [name], [facility] Other Clinician: Referring Physician: Treating Physician/Extender: [name], [facility] Weeks in Treatment: 1 Education Assessment Education Provided To: Patient Education Topics Provided Wound/Skin Impairment: Methods: Explain/Verbal Responses: Reinforcements needed, State content correctly [name], [name] ([id number]) [id number] Page 3 of 10 Electronic  Signature(s) Signed: 12/13/2022 3:57:51 PM By: [name] Entered By: [name] on 12/13/2022 12:56:57 -------------------------------------------------------------------------------- Wound Assessment Details Patient Name: Date of Service: [name] [facility] RO N 12/13/2022 3:00 PM Medical Record Number: [id number] Patient Account Number: [id number] Date of Birth/Sex: Treating RN: 1940-06-03 (82 y.o. [name] Primary Care Hawthorne Day: [name], [facility] Other Clinician: Referring Yetunde Leis: Treating Annikah Lovins/Extender: [name] SHO [name], [facility] Weeks in Treatment: 26 Wound Status Wound Number: 10 Primary Etiology: Lymphedema Wound Location: Left T Second oe Wound Status: Open Wounding Event: Gradually Appeared Comorbid History: Anemia, Angina, Hypertension, Vasculitis, Osteoarthritis Date Acquired: 09/05/2022 Weeks Of Treatment: 1 Clustered Wound: No Wound Measurements Length:  (cm) 0.5 Width: (cm) 0.5 Depth: (cm) 0.1 Area: (cm) 0.196 Volume: (cm) 0.02 % Reduction in Area: 0% % Reduction in Volume: 0% Epithelialization: Small (1-33%) Wound Description Classification: Full Thickness With Exposed Support Structures Wound Margin: Distinct, outline attached Exudate Amount: Medium Exudate Type: Serosanguineous Exudate Color: red, brown Foul Odor After Cleansing: No Slough/Fibrino Yes Wound Bed Granulation Amount: Small (1-33%) Exposed Structure Granulation Quality: Red Fascia Exposed: No Necrotic Amount: Large (67-100%) Fat Layer (Subcutaneous Tissue) Exposed: Yes Necrotic Quality: Adherent Slough Tendon Exposed: No Muscle Exposed: No Joint Exposed: No Bone Exposed: Yes Periwound Skin Texture Texture Color No Abnormalities Noted: Yes No Abnormalities Noted: Yes Moisture Temperature / Pain No Abnormalities Noted: Yes Temperature: No Abnormality Tenderness on Palpation: Yes Treatment Notes Wound #10 (Toe Second) Wound Laterality: Left Cleanser Soap and Water Discharge Instruction: May shower and wash wound with dial antibacterial soap and water prior to dressing change. Wound Cleanser Discharge Instruction: Cleanse the wound with wound cleanser prior to applying a clean dressing using gauze sponges, not tissue or cotton balls. Peri-Wound Care [name] Lotion (Moisturizing lotion) Discharge Instruction: Apply moisturizing lotion as directed [name], [name] ([id number]) [id number] Page 4 of 10 Topical Gentamicin Discharge Instruction: As directed by physician Mupirocin Ointment Discharge Instruction: Apply Mupirocin (Bactroban) as instructed Ketoconazole Cream 2% Discharge Instruction: Apply Ketoconazole as directed Triamcinolone Discharge Instruction: Apply Triamcinolone as directed zinc Primary Dressing Endoform 2x2 in Discharge Instruction: Moisten with saline Secondary Dressing ABD Pad, 8x10 Discharge  Instruction: Apply over primary dressing as directed. Drawtex 4x4 in Discharge Instruction: Apply over primary dressing as directed. Woven Gauze Sponge, Non-Sterile 4x4 in Discharge Instruction: can use gauze between toes (if no desired) Zetuvit Plus 4x8 in Discharge Instruction: Apply over primary dressing as directed. Secured With Compression Wrap Urgo K2 Lite, (equivalent to a 3 layer) two layer compression system, regular Discharge Instruction: Apply Urgo K2 Lite as directed (alternative to 3 layer compression). Compression Stockings Add-Ons Electronic Signature(s) Signed: 12/13/2022 3:57:51 PM By: [name] Entered By: [name] on 12/13/2022 12:56:04 -------------------------------------------------------------------------------- Wound Assessment Details Patient Name: Date of Service: [name] [facility] RO N 12/13/2022 3:00 PM Medical Record Number: [id number] Patient Account Number: [id number] Date of Birth/Sex: Treating RN: July 03, 1940 (82 y.o. [name] Primary Care Margarit Minshall: [name], [facility] Other Clinician: Referring Branda Chaudhary: Treating Demetris Capell/Extender: [name] SHO [name], [facility] Weeks in Treatment: 26 Wound Status Wound Number: 11 Primary Etiology: Lymphedema Wound Location: Right, Anterior Lower Leg Wound Status: Open Wounding Event: Gradually Appeared Comorbid History: Anemia, Angina, Hypertension, Vasculitis, Osteoarthritis Date Acquired: 12/06/2022 Weeks Of Treatment: 1 Clustered Wound: No Wound Measurements Length: (cm) 1 Width: (cm) 1 Depth: (cm) 0.1 Area: (cm) 0.785 Volume: (cm) 0.079 [name], [name] ([id number]) Wound Description Classification: 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 Support Structures Wound Margin: Distinct, outline attached Exudate Amount: Medium Exudate Type: Serosanguineous Exudate Color: red, brown  Foul Odor After Cleansing: No Slough/Fibrino Yes % Reduction in Area: 0% % Reduction in Volume:  0% Epithelialization: None [id number] Page 5 of 10 Wound Bed Granulation Amount: Large (67-100%) Exposed Structure Granulation Quality: Red Fascia Exposed: No Necrotic Amount: Small (1-33%) Fat Layer (Subcutaneous Tissue) Exposed: Yes Necrotic Quality: Adherent Slough Tendon Exposed: No Muscle Exposed: No Joint Exposed: No Bone Exposed: No Periwound Skin Texture Texture Color No Abnormalities Noted: Yes No Abnormalities Noted: Yes Moisture Temperature / Pain No Abnormalities Noted: No Temperature: No Abnormality Dry / Scaly: Yes Treatment Notes Wound #11 (Lower Leg) Wound Laterality: Right, Anterior Cleanser Soap and Water Discharge Instruction: May shower and wash wound with dial antibacterial soap and water prior to dressing change. Wound Cleanser Discharge Instruction: Cleanse the wound with wound cleanser prior to applying a clean dressing using gauze sponges, not tissue or cotton balls. Peri-Wound Care [name] Lotion (Moisturizing lotion) Discharge Instruction: Apply moisturizing lotion as directed Topical Primary Dressing Maxorb Extra Ag+ Alginate Dressing, 2x2 (in/in) Discharge Instruction: Apply to wound bed as instructed Secondary Dressing Woven Gauze Sponge, Non-Sterile 4x4 in Discharge Instruction: Apply over primary dressing as directed. Secured With Compression Wrap Urgo K2 Lite, (equivalent to a 3 layer) two layer compression system, regular Discharge Instruction: Apply Urgo K2 Lite as directed (alternative to 3 layer compression). Compression Stockings Add-Ons Electronic Signature(s) Signed: 12/13/2022 3:57:51 PM By: [name] Entered By: [name] on 12/13/2022 12:56:09 -------------------------------------------------------------------------------- Wound Assessment Details Patient Name: Date of Service: [name] [facility] RO N 12/13/2022 3:00 PM Medical Record Number: [id number] Patient Account Number:  [id number] [name], [name] ([id number]) [phone].pdf Page 6 of 10 Date of Birth/Sex: Treating RN: 23-Dec-1940 (82 y.o. [name] Primary Care Breydan Shilling[place]: [name], [facility] Other Clinician: Referring Crisol Muecke: Treating Dionisios Ricci/Extender: [name] SHO [name], [facility] Weeks in Treatment: 26 Wound Status Wound Number: 6 Primary Etiology: Lymphedema Wound Location: Left, Dorsal Foot Wound Status: Open Wounding Event: Gradually Appeared Comorbid History: Anemia, Angina, Hypertension, Vasculitis, Osteoarthritis Date Acquired: 09/05/2022 Weeks Of Treatment: 14 Clustered Wound: No Wound Measurements Length: (cm) 3 Width: (cm) 3 Depth: (cm) 0.1 Area: (cm) 7.069 Volume: (cm) 0.707 % Reduction in Area: 81.8% % Reduction in Volume: 81.8% Epithelialization: Medium (34-66%) Wound Description Classification: 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 Suppo Wound Margin: Indistinct, nonvisible Exudate Amount: Medium Exudate Type: Serosanguineous Exudate Color: red, brown rt Structures Foul Odor After Cleansing: No Slough/Fibrino Yes Wound Bed Granulation Amount: Medium (34-66%) Exposed Structure Granulation Quality: Red, Pink Fascia Exposed: No Necrotic Amount: Medium (34-66%) Fat Layer (Subcutaneous Tissue) Exposed: Yes Necrotic Quality: Adherent Slough Tendon Exposed: No Muscle Exposed: No Joint Exposed: No Bone Exposed: No Periwound Skin Texture Texture Color No Abnormalities Noted: Yes No Abnormalities Noted: No Rubor: Yes Moisture No Abnormalities Noted: Yes Temperature / Pain Temperature: Cool/Cold Tenderness on Palpation: Yes Treatment Notes Wound #6 (Foot) Wound Laterality: Dorsal, Left Cleanser Soap and Water Discharge Instruction: May shower and wash wound with dial antibacterial soap and water prior to dressing change. Wound Cleanser Discharge Instruction: Cleanse the wound with wound cleanser prior to applying a clean dressing using gauze  sponges, not tissue or cotton balls. Peri-Wound Care [name] Lotion (Moisturizing lotion) Discharge Instruction: Apply moisturizing lotion as directed Topical Gentamicin Discharge Instruction: As directed by physician Mupirocin Ointment Discharge Instruction: Apply Mupirocin (Bactroban) as instructed Ketoconazole Cream 2% Discharge Instruction: Apply Ketoconazole as directed Triamcinolone Discharge Instruction: Apply Triamcinolone as directed zinc Primary Dressing Maxorb Extra Ag+ Alginate Dressing, 4x4.75 (in/in) Bartunek, [name] ([id number]) [id number] Page 7 of 10 Discharge Instruction: Apply  to wound bed as instructed Secondary Dressing ABD Pad, 8x10 Discharge Instruction: Apply over primary dressing as directed. Drawtex 4x4 in Discharge Instruction: Apply over primary dressing as directed. Woven Gauze Sponge, Non-Sterile 4x4 in Discharge Instruction: can use gauze between toes (if no desired) Zetuvit Plus 4x8 in Discharge Instruction: Apply over primary dressing as directed. Secured With Compression Wrap Urgo K2 Lite, (equivalent to a 3 layer) two layer compression system, regular Discharge Instruction: Apply Urgo K2 Lite as directed (alternative to 3 layer compression). Compression Stockings Add-Ons Electronic Signature(s) Signed: 12/13/2022 3:57:51 PM By: [name] Entered By: [name] on 12/13/2022 12:56:14 -------------------------------------------------------------------------------- Wound Assessment Details Patient Name: Date of Service: [name] [facility] RO N 12/13/2022 3:00 PM Medical Record Number: [id number] Patient Account Number: [id number] Date of Birth/Sex: Treating RN: Sep 09, 1940 (82 y.o. [name] Primary Care Dodge Ator: [name], [facility] Other Clinician: Referring Kalen Ratajczak: Treating Yates Weisgerber/Extender: [name] SHO [name], [facility] Weeks in Treatment: 26 Wound Status Wound Number: 8 Primary  Etiology: Pressure Ulcer Wound Location: Left, Lateral Foot Wound Status: Open Wounding Event: Pressure Injury Comorbid History: Anemia, Angina, Hypertension, Vasculitis, Osteoarthritis Date Acquired: 10/24/2022 Weeks Of Treatment: 7 Clustered Wound: No Wound Measurements Length: (cm) 0.8 Width: (cm) 0.7 Depth: (cm) 0.3 Area: (cm) 0.44 Volume: (cm) 0.132 % Reduction in Area: -124.5% % Reduction in Volume: -238.5% Epithelialization: Small (1-33%) Wound Description Classification: Category/Stage IV Wound Margin: Distinct, outline attached Exudate Amount: Medium Exudate Type: Serosanguineous Exudate Color: red, brown Foul Odor After Cleansing: No Slough/Fibrino Yes Wound Bed Granulation Amount: Small (1-33%) Exposed Structure Granulation Quality: Red Fascia Exposed: No Necrotic Amount: Large (67-100%) Fat Layer (Subcutaneous Tissue) Exposed: Yes Necrotic Quality: Adherent Slough Tendon Exposed: Yes Muscle Exposed: No Joint Exposed: No Bone Exposed: No [name], [name] ([id number]) [id number] Page 8 of 10 Periwound Skin Texture Texture Color No Abnormalities Noted: Yes No Abnormalities Noted: Yes Moisture Temperature / Pain No Abnormalities Noted: Yes Temperature: No Abnormality Tenderness on Palpation: Yes Treatment Notes Wound #8 (Foot) Wound Laterality: Left, Lateral Cleanser Soap and Water Discharge Instruction: May shower and wash wound with dial antibacterial soap and water prior to dressing change. Wound Cleanser Discharge Instruction: Cleanse the wound with wound cleanser prior to applying a clean dressing using gauze sponges, not tissue or cotton balls. Peri-Wound Care [name] Lotion (Moisturizing lotion) Discharge Instruction: Apply moisturizing lotion as directed Topical Gentamicin Discharge Instruction: As directed by physician Mupirocin Ointment Discharge Instruction: Apply Mupirocin (Bactroban) as instructed Ketoconazole  Cream 2% Discharge Instruction: Apply Ketoconazole as directed Triamcinolone Discharge Instruction: Apply Triamcinolone as directed zinc Primary Dressing Endoform 2x2 in Discharge Instruction: Moisten with saline Secondary Dressing ABD Pad, 8x10 Discharge Instruction: Apply over primary dressing as directed. Drawtex 4x4 in Discharge Instruction: Apply over primary dressing as directed. Woven Gauze Sponge, Non-Sterile 4x4 in Discharge Instruction: can use gauze between toes (if no desired) Zetuvit Plus 4x8 in Discharge Instruction: Apply over primary dressing as directed. Secured With Compression Wrap Urgo K2 Lite, (equivalent to a 3 layer) two layer compression system, regular Discharge Instruction: Apply Urgo K2 Lite as directed (alternative to 3 layer compression). Compression Stockings Add-Ons Electronic Signature(s) Signed: 12/13/2022 3:57:51 PM By: [name] Entered By: [name] on 12/13/2022 12:56:20 Wound Assessment Details -------------------------------------------------------------------------------- [name] ([id number]) [id number] Page 9 of 10 Patient Name: Date of Service: [name] 12/13/2022 3:00 PM Medical Record Number: [id number] Patient Account Number: [id number] Date of Birth/Sex: Treating RN: 12-05-1940 (82 y.o. [name] Primary Care Malcome Ambrocio: [name], [facility] Other Clinician:  Referring Rena Hunke: Treating Antolin Belsito/Extender: [name] SHO [name], [facility] Weeks in Treatment: 26 Wound Status Wound Number: 9 Primary Etiology: Auto-immune Wound Location: Left, Lateral T Great oe Wound Status: Open Wounding Event: Gradually Appeared Comorbid History: Anemia, Angina, Hypertension, Vasculitis, Osteoarthritis Date Acquired: 11/01/2022 Weeks Of Treatment: 6 Clustered Wound: No Wound Measurements Length: (cm) 0.2 % Reduction in Area: 93.2% Width: (cm) 0.3 % Reduction in Volume:  92.8% Depth: (cm) 0.1 Epithelialization: Large (67-100%) Area: (cm) 0.047 Volume: (cm) 0.005 Wound Description Classification: Full Thickness With Exposed Support Structures Foul Odor After Cleansing: No Wound Margin: Flat and Intact Slough/Fibrino Yes Exudate Amount: Small Exudate Type: Serosanguineous Exudate Color: red, brown Wound Bed Granulation Amount: Small (1-33%) Exposed Structure Granulation Quality: Pink Fascia Exposed: No Necrotic Amount: Large (67-100%) Fat Layer (Subcutaneous Tissue) Exposed: Yes Necrotic Quality: Adherent Slough Tendon Exposed: No Muscle Exposed: No Joint Exposed: No Bone Exposed: Yes Periwound Skin Texture Texture Color No Abnormalities Noted: Yes No Abnormalities Noted: No Rubor: Yes Moisture No Abnormalities Noted: Yes Temperature / Pain Temperature: Cool/Cold Tenderness on Palpation: Yes Treatment Notes Wound #9 (Toe Great) Wound Laterality: Left, Lateral Cleanser Soap and Water Discharge Instruction: May shower and wash wound with dial antibacterial soap and water prior to dressing change. Wound Cleanser Discharge Instruction: Cleanse the wound with wound cleanser prior to applying a clean dressing using gauze sponges, not tissue or cotton balls. Peri-Wound Care [name] Lotion (Moisturizing lotion) Discharge Instruction: Apply moisturizing lotion as directed Topical Gentamicin Discharge Instruction: As directed by physician Mupirocin Ointment Discharge Instruction: Apply Mupirocin (Bactroban) as instructed Ketoconazole Cream 2% Discharge Instruction: Apply Ketoconazole as directed Triamcinolone Discharge Instruction: Apply Triamcinolone as directed zinc [name], [name] ([id number]) [id number] Page 10 of 10 Primary Dressing Endoform 2x2 in Discharge Instruction: Moisten with saline Secondary Dressing ABD Pad, 8x10 Discharge Instruction: Apply over primary dressing as directed. Drawtex 4x4  in Discharge Instruction: Apply over primary dressing as directed. Woven Gauze Sponge, Non-Sterile 4x4 in Discharge Instruction: can use gauze between toes (if no desired) Zetuvit Plus 4x8 in Discharge Instruction: Apply over primary dressing as directed. Secured With Compression Wrap Urgo K2 Lite, (equivalent to a 3 layer) two layer compression system, regular Discharge Instruction: Apply Urgo K2 Lite as directed (alternative to 3 layer compression). Compression Stockings Add-Ons Electronic Signature(s) Signed: 12/13/2022 3:57:51 PM By: [name] Entered By: [name] on 12/13/2022 12:56:25

## 2022-12-14 ENCOUNTER — Encounter (HOSPITAL_COMMUNITY)
Admission: RE | Disposition: A | Discharge status: Home / Self Care | Payer: Self-pay | Source: Home / Self Care | Attending: Vascular Surgery

## 2022-12-14 ENCOUNTER — Other Ambulatory Visit: Payer: Self-pay

## 2022-12-14 ENCOUNTER — Ambulatory Visit (HOSPITAL_BASED_OUTPATIENT_CLINIC_OR_DEPARTMENT_OTHER): Payer: Medicare Other | Admitting: General Surgery

## 2022-12-14 ENCOUNTER — Ambulatory Visit (HOSPITAL_COMMUNITY)
Admission: RE | Admit: 2022-12-14 | Discharge: 2022-12-14 | Disposition: A | Discharge status: Home / Self Care | Payer: Medicare Other | Attending: Vascular Surgery | Admitting: Vascular Surgery

## 2022-12-14 DIAGNOSIS — L97529 Non-pressure chronic ulcer of other part of left foot with unspecified severity: Secondary | ICD-10-CM | POA: Insufficient documentation

## 2022-12-14 DIAGNOSIS — I70245 Atherosclerosis of native arteries of left leg with ulceration of other part of foot: Secondary | ICD-10-CM | POA: Insufficient documentation

## 2022-12-14 HISTORY — PX: ABDOMINAL AORTOGRAM W/LOWER EXTREMITY: CATH118223

## 2022-12-14 LAB — POCT I-STAT, CHEM 8
BUN: 20 mg/dL (ref 8–23)
Calcium, Ion: 1.13 mmol/L — ABNORMAL LOW (ref 1.15–1.40)
Chloride: 103 mmol/L (ref 98–111)
Creatinine, Ser: 0.9 mg/dL (ref 0.44–1.00)
Glucose, Bld: 89 mg/dL (ref 70–99)
HCT: 37 % (ref 36.0–46.0)
Hemoglobin: 12.6 g/dL (ref 12.0–15.0)
Potassium: 4.2 mmol/L (ref 3.5–5.1)
Sodium: 139 mmol/L (ref 135–145)
TCO2: 26 mmol/L (ref 22–32)

## 2022-12-14 SURGERY — ABDOMINAL AORTOGRAM W/LOWER EXTREMITY
Anesthesia: LOCAL | Laterality: Left

## 2022-12-14 MED ORDER — IODIXANOL 320 MG/ML IV SOLN
INTRAVENOUS | Status: DC | PRN
  Administered 2022-12-14: 50 mL

## 2022-12-14 MED ORDER — LIDOCAINE HCL (PF) 1 % IJ SOLN
INTRAMUSCULAR | Status: AC
  Filled 2022-12-14: qty 30

## 2022-12-14 MED ORDER — SODIUM CHLORIDE 0.9 % IV SOLN: 250.0000 mL | INTRAVENOUS | Status: DC | PRN

## 2022-12-14 MED ORDER — ONDANSETRON HCL 4 MG/2ML IJ SOLN: 4.0000 mg | Freq: Four times a day (QID) | INTRAMUSCULAR | Status: DC | PRN

## 2022-12-14 MED ORDER — SODIUM CHLORIDE 0.9% FLUSH: 3.0000 mL | Freq: Two times a day (BID) | INTRAVENOUS | Status: DC

## 2022-12-14 MED ORDER — LABETALOL HCL 5 MG/ML IV SOLN: 10.0000 mg | INTRAVENOUS | Status: DC | PRN

## 2022-12-14 MED ORDER — ACETAMINOPHEN 325 MG PO TABS: 650.0000 mg | ORAL_TABLET | ORAL | Status: DC | PRN

## 2022-12-14 MED ORDER — SODIUM CHLORIDE 0.9 % IV SOLN: INTRAVENOUS | Status: DC

## 2022-12-14 MED ORDER — SODIUM CHLORIDE 0.9% FLUSH: 3.0000 mL | INTRAVENOUS | Status: DC | PRN

## 2022-12-14 MED ORDER — FENTANYL CITRATE (PF) 100 MCG/2ML IJ SOLN
INTRAMUSCULAR | Status: AC
  Filled 2022-12-14: qty 2

## 2022-12-14 MED ORDER — SODIUM CHLORIDE 0.9 % WEIGHT BASED INFUSION
1.0000 mL/kg/h | INTRAVENOUS | Status: DC
  Administered 2022-12-14: 1 mL/kg/h via INTRAVENOUS

## 2022-12-14 MED ORDER — LIDOCAINE HCL (PF) 1 % IJ SOLN
INTRAMUSCULAR | Status: DC | PRN
  Administered 2022-12-14: 15 mL

## 2022-12-14 MED ORDER — MIDAZOLAM HCL 2 MG/2ML IJ SOLN
INTRAMUSCULAR | Status: DC | PRN
  Administered 2022-12-14: .5 mg via INTRAVENOUS

## 2022-12-14 MED ORDER — MIDAZOLAM HCL 2 MG/2ML IJ SOLN
INTRAMUSCULAR | Status: AC
  Filled 2022-12-14: qty 2

## 2022-12-14 MED ORDER — HYDRALAZINE HCL 20 MG/ML IJ SOLN: 5.0000 mg | INTRAMUSCULAR | Status: DC | PRN

## 2022-12-14 MED ORDER — HEPARIN (PORCINE) IN NACL 1000-0.9 UT/500ML-% IV SOLN
INTRAVENOUS | Status: DC | PRN
  Administered 2022-12-14 (×2): 500 mL

## 2022-12-14 MED ORDER — FENTANYL CITRATE (PF) 100 MCG/2ML IJ SOLN
INTRAMUSCULAR | Status: DC | PRN
  Administered 2022-12-14: 25 ug via INTRAVENOUS

## 2022-12-14 SURGICAL SUPPLY — 9 items
CATH NAVICROSS ST .035X90CM (MICROCATHETER) IMPLANT
CATH OMNI FLUSH 5F 65CM (CATHETERS) IMPLANT
GUIDEWIRE ANGLED .035X150CM (WIRE) IMPLANT
KIT MICROPUNCTURE NIT STIFF (SHEATH) IMPLANT
SET ATX-X65L (MISCELLANEOUS) IMPLANT
SHEATH PINNACLE 5F 10CM (SHEATH) IMPLANT
SHEATH PROBE COVER 6X72 (BAG) IMPLANT
TRAY PV CATH (CUSTOM PROCEDURE TRAY) ×1 IMPLANT
WIRE BENTSON .035X145CM (WIRE) IMPLANT

## 2022-12-14 NOTE — Progress Notes (Signed)
Assumed care of pt at this time.

## 2022-12-14 NOTE — Progress Notes (Signed)
Pt unable to ambulate to bathroom, pt stood at bedside X 3 with no signs of oozing from right groin site

## 2022-12-14 NOTE — Progress Notes (Signed)
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  Manual pressure held for 25 minutes.  Bed rest started at 1400.  Pt. Remains A&o x1.  No swelling noted.  Rt foot and toes warm to touch.  Drsg. Clean dry and intact to rt. Groin, soft to touch.  Pt. Denies pain.

## 2022-12-14 NOTE — Op Note (Signed)
DATE OF SERVICE: 12/14/2022  PATIENT:  [name]  82 y.o. female  PRE-OPERATIVE DIAGNOSIS:  Atherosclerosis of native arteries of left lower extremity causing ulceration  POST-OPERATIVE DIAGNOSIS:  Same  PROCEDURE:   1) Ultrasound guided right common femoral access 2) Aortogram 3) Left lower extremity angiogram with second order cannulation (50mL total contrast) 4) Conscious sedation (13 minutes)  SURGEON:  Rande Brunt. Lenell Antu, MD  ASSISTANT: none  ANESTHESIA:   local and IV sedation  ESTIMATED BLOOD LOSS: minimal  LOCAL MEDICATIONS USED:  LIDOCAINE   COUNTS: confirmed correct.  PATIENT DISPOSITION:  PACU - hemodynamically stable.   Delay start of Pharmacological VTE agent (>24hrs) due to surgical blood loss or risk of bleeding: no  INDICATION FOR PROCEDURE: [name] is a 82 y.o. female with left foot ulceration in setting of peripheral arterial disease. After careful discussion of risks, benefits, and alternatives the patient was offered angiography. The patient understood and wished to proceed.  OPERATIVE FINDINGS:  Renal arteries patent bilaterally Infrarenal aorta patent Iliac arteries patent bilaterally  Left lower extremity: Common femoral artery: patent  Profunda femoris artery: patent  Superficial femoral artery: patent Popliteal artery: patent Anterior tibial artery: patent at its origin. Occludes about the ankle. Tibioperoneal trunk: patent Peroneal artery: patent to the ankle Posterior tibial artery: occluded Pedal circulation: disadvantaged, fills via peroneal  GLASS score. FP 0. IP 0. Stage 1.   WIfI score. 1 / 3 / 0  DESCRIPTION OF PROCEDURE: After identification of the patient in the pre-operative holding area, the patient was transferred to the operating room. The patient was positioned supine on the operating room table.  Anesthesia was induced. The groins was prepped and draped in standard fashion. A surgical pause was performed  confirming correct patient, procedure, and operative location.  The right groin was anesthetized with subcutaneous injection of 1% lidocaine. Using ultrasound guidance, the right common femoral artery was accessed with micropuncture technique. Fluoroscopy was used to confirm cannulation over the femoral head. The 44F sheath was upsized to 42F.   A Benson wire was advanced into the distal aorta. Over the wire an omni flush catheter was advanced to the level of L2. Aortogram was performed - see above for details.   The left common iliac artery was selected with an omniflush catheter and glidewire guidewire. The wire was advanced into the common femoral artery. Over the wire the omni flush catheter was advanced into the external iliac artery. Selective angiography was performed - see above for details.   The sheath was left in place to be removed in the recovery area.   Conscious sedation was administered with the use of IV fentanyl and midazolam under continuous physician and nurse monitoring.  Heart rate, blood pressure, and oxygen saturation were continuously monitored.  Total sedation time was 13 minutes  Upon completion of the case instrument and sharps counts were confirmed correct. The patient was transferred to the PACU in good condition. I was present for all portions of the procedure.  PLAN: ASA / Statin. No options for revascularization. 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 Recommend wound care alone. Should she fail to heal she would need a major amputation or transition to comfort measures only.   Rande Brunt. Lenell Antu, MD [facility] Phone Number: [phone] 12/14/2022 12:51 PM

## 2022-12-14 NOTE — Interval H&P Note (Signed)
History and Physical Interval Note:  12/14/2022 11:27 AM  [name]  has presented today for surgery, with the diagnosis of pad w/ ulcer.  The various methods of treatment have been discussed with the patient and family. After consideration of risks, benefits and other options for treatment, the patient has consented to  Procedure(s): ABDOMINAL AORTOGRAM W/LOWER EXTREMITY (N/A) as a surgical intervention.  The patient's history has been reviewed, patient examined, no change in status, stable for surgery.  I have reviewed the patient's chart and labs.  Questions were answered to the patient's satisfaction.     Leonie Douglas

## 2022-12-19 ENCOUNTER — Ambulatory Visit (HOSPITAL_BASED_OUTPATIENT_CLINIC_OR_DEPARTMENT_OTHER): Payer: Medicare Other | Admitting: General Surgery

## 2022-12-19 ENCOUNTER — Encounter (HOSPITAL_COMMUNITY): Payer: Self-pay | Admitting: Vascular Surgery

## 2022-12-21 ENCOUNTER — Ambulatory Visit (HOSPITAL_BASED_OUTPATIENT_CLINIC_OR_DEPARTMENT_OTHER): Payer: Medicare Other | Admitting: General Surgery

## 2022-12-27 ENCOUNTER — Encounter (HOSPITAL_BASED_OUTPATIENT_CLINIC_OR_DEPARTMENT_OTHER): Payer: Medicare Other | Attending: General Surgery | Admitting: General Surgery

## 2022-12-27 DIAGNOSIS — L97522 Non-pressure chronic ulcer of other part of left foot with fat layer exposed: Secondary | ICD-10-CM | POA: Diagnosis present

## 2022-12-27 DIAGNOSIS — L97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 without evidence of necrosis: Secondary | ICD-10-CM | POA: Insufficient documentation

## 2022-12-27 DIAGNOSIS — Z8673 Personal history of transient ischemic attack (TIA), and cerebral infarction without residual deficits: Secondary | ICD-10-CM | POA: Insufficient documentation

## 2022-12-27 DIAGNOSIS — Z7901 Long term (current) use of anticoagulants: Secondary | ICD-10-CM | POA: Diagnosis not present

## 2022-12-27 DIAGNOSIS — M341 CR(E)ST syndrome: Secondary | ICD-10-CM | POA: Insufficient documentation

## 2022-12-27 DIAGNOSIS — L97812 Non-pressure chronic ulcer of other part of right lower leg with fat layer exposed: Secondary | ICD-10-CM | POA: Diagnosis not present

## 2022-12-27 DIAGNOSIS — L97525 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 without evidence of necrosis: Secondary | ICD-10-CM | POA: Insufficient documentation

## 2022-12-27 DIAGNOSIS — I890 Lymphedema, not elsewhere classified: Secondary | ICD-10-CM | POA: Insufficient documentation

## 2022-12-27 DIAGNOSIS — I48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 atrial fibrillation: Secondary | ICD-10-CM | POA: Insufficient documentation

## 2022-12-27 DIAGNOSIS — Z87891 Personal history of nicotine dependence: Secondary | ICD-10-CM | POA: Insufficient documentation

## 2022-12-27 DIAGNOSIS — I10 Essential (primary) hypertension: Secondary | ICD-10-CM | POA: Diagnosis not present

## 2023-01-02 NOTE — Progress Notes (Signed)
YUVIA, KIROUAC ([id number]) [id number] Page 1 of 17 Visit Report for 12/27/2022 Chief Complaint Document Details Patient Name: Date of Service: [name] 12/27/2022 1:30 PM Medical Record Number: [id number] Patient Account Number: [id number] Date of Birth/Sex: Treating RN: Aug 09, 1940 (82 y.o. F) Primary Care Provider: [name], [facility] Other Clinician: Referring Provider: Treating Provider/Extender: [name], [facility] Weeks in Treatment: 75 Information Obtained from: Patient Chief Complaint Patient seen for complaints of Non-Healing Wound. Electronic Signature(s) Signed: 12/27/2022 3:02:24 PM By: [name] MD FACS Entered By: [name] on 12/27/2022 15:02:24 -------------------------------------------------------------------------------- Debridement Details Patient Name: Date of Service: [name], [facility] [name] 12/27/2022 1:30 PM Medical Record Number: [id number] Patient Account Number: [id number] Date of Birth/Sex: Treating RN: 1941/01/16 (82 y.o. Kateri Mc Primary Care Provider: [name], [facility] Other Clinician: Referring Provider: Treating Provider/Extender: [name], [facility] Weeks in Treatment: 28 Debridement Performed for Assessment: Wound #10 Left T Second oe Performed By: Physician [name], MD Debridement Type: Debridement Level of Consciousness (Pre-procedure): Awake and Alert Pre-procedure Verification/Time Out Yes - 14:12 Taken: Start Time: 14:13 Pain Control: Lidocaine 4% T opical Solution Percent of Wound Bed Debrided: 100% T Area Debrided (cm): otal 0.33 Tissue and other material debrided: Non-Viable, Slough, Slough Level: Non-Viable Tissue Debridement Description: Selective/Open Wound Instrument: Curette Bleeding: Minimum Hemostasis Achieved: Pressure Response to Treatment: Procedure was tolerated well Level of Consciousness (Post- Awake and Alert procedure): Post Debridement  Measurements of Total Wound Length: (cm) 0.7 Width: (cm) 0.6 Depth: (cm) 0.1 Volume: (cm) 0.033 Character of Wound/Ulcer Post Debridement: Requires Further Debridement Post Procedure Diagnosis Same as Pre-procedure OBENAUF, Elpidia ([id number]) [id number] Page 2 of 17 Notes scribed for Dr. Lady Gary by Tommie Ard, RN Electronic Signature(s) Signed: 12/27/2022 4:20:36 PM By: [name] MD FACS Signed: 01/02/2023 8:25:20 AM By: Tommie Ard RN Entered By: Tommie Ard on 12/27/2022 14:15:33 -------------------------------------------------------------------------------- Debridement Details Patient Name: Date of Service: [name], [facility] [name] 12/27/2022 1:30 PM Medical Record Number: [id number] Patient Account Number: [id number] Date of Birth/Sex: Treating RN: 1940-11-04 (82 y.o. Kateri Mc Primary Care Provider: [name], [facility] Other Clinician: Referring Provider: Treating Provider/Extender: [name], [facility] Weeks in Treatment: 28 Debridement Performed for Assessment: Wound #9 Left,Lateral T Great oe Performed By: Physician [name], MD Debridement Type: Debridement Level of Consciousness (Pre-procedure): Awake and Alert Pre-procedure Verification/Time Out Yes - 14:12 Taken: Start Time: 14:13 Percent of Wound Bed Debrided: 100% T Area Debrided (cm): otal 0.05 Tissue and other material debrided: Non-Viable, Slough, Slough Level: Non-Viable Tissue Debridement Description: Selective/Open Wound Instrument: Curette Bleeding: Minimum Hemostasis Achieved: Pressure Response to Treatment: Procedure was tolerated well Level of Consciousness (Post- Awake and Alert procedure): Post Debridement Measurements of Total Wound Length: (cm) 0.2 Width: (cm) 0.3 Depth: (cm) 0.1 Volume: (cm) 0.005 Character of Wound/Ulcer Post Debridement: Requires Further Debridement Post Procedure Diagnosis Same as Pre-procedure Notes Scribed for  Dr. Lady Gary by Tommie Ard, RN Electronic Signature(s) Signed: 12/27/2022 4:20:36 PM By: [name] MD FACS Signed: 01/02/2023 8:25:20 AM By: Tommie Ard RN Entered By: Tommie Ard on 12/27/2022 14:18:12 -------------------------------------------------------------------------------- Debridement Details Patient Name: Date of Service: [name], [facility] [name] 12/27/2022 1:30 PM Medical Record Number: [id number] Patient Account Number: [id number] Date of Birth/Sex: Treating RN: [birth year] (82 y.o. Kateri Mc [place], [place] ([id number]) [id number] Page 3 of 17 Primary Care Provider: [name], [facility] Other Clinician: Referring Provider: Treating Provider/Extender: [name] SHO [name], [facility] Weeks in Treatment: 28 Debridement Performed for Assessment: Wound #  8 Left,Lateral Foot Performed By: Physician [name], MD Debridement Type: Debridement Level of Consciousness (Pre-procedure): Awake and Alert Pre-procedure Verification/Time Out Yes - 14:12 Taken: Start Time: 14:13 Percent of Wound Bed Debrided: 100% T Area Debrided (cm): otal 1.02 Tissue and other material debrided: Viable, Non-Viable, Slough, Subcutaneous, Slough Level: Skin/Subcutaneous Tissue Debridement Description: Excisional Instrument: Curette Bleeding: Minimum Hemostasis Achieved: Pressure Response to Treatment: Procedure was tolerated well Level of Consciousness (Post- Awake and Alert procedure): Post Debridement Measurements of Total Wound Length: (cm) 1.3 Stage: Category/Stage IV Width: (cm) 1 Depth: (cm) 0.3 Volume: (cm) 0.306 Character of Wound/Ulcer Post Debridement: Requires Further Debridement Post Procedure Diagnosis Same as Pre-procedure Notes Scribed for Dr. Lady Gary by Tommie Ard, RN Electronic Signature(s) Signed: 12/27/2022 4:20:36 PM By: [name] MD FACS Signed: 01/02/2023 8:25:20 AM By: Tommie Ard RN Entered By: Tommie Ard on 12/27/2022  14:18:51 -------------------------------------------------------------------------------- Debridement Details Patient Name: Date of Service: [name], [facility] [name] 12/27/2022 1:30 PM Medical Record Number: [id number] Patient Account Number: [id number] Date of Birth/Sex: Treating RN: [birth year] (82 y.o. Kateri Mc Primary Care Provider: [name], [facility] Other Clinician: Referring Provider: Treating Provider/Extender: [name], [facility] Weeks in Treatment: 28 Debridement Performed for Assessment: Wound #6 Left,Dorsal Foot Performed By: Physician [name], MD Debridement Type: Debridement Level of Consciousness (Pre-procedure): Awake and Alert Pre-procedure Verification/Time Out Yes - 14:12 Taken: Start Time: 14:13 Percent of Wound Bed Debrided: 100% T Area Debrided (cm): otal 12.36 Tissue and other material debrided: Non-Viable, Eschar, Slough, Slough Level: Non-Viable Tissue Debridement Description: Selective/Open Wound Instrument: Curette Bleeding: Minimum Hemostasis Achieved: Pressure Response to Treatment: Procedure was tolerated well Level of Consciousness (Post- Awake and Alert procedure): Post Debridement Measurements of Total Wound [name], [name] ([id number]) [id number] Page 4 of 17 Length: (cm) 3.5 Width: (cm) 4.5 Depth: (cm) 0.1 Volume: (cm) 1.237 Character of Wound/Ulcer Post Debridement: Requires Further Debridement Post Procedure Diagnosis Same as Pre-procedure Notes scribed for Dr. Lady Gary by Tommie Ard, RN Electronic Signature(s) Signed: 12/27/2022 4:20:36 PM By: [name] MD FACS Signed: 01/02/2023 8:25:20 AM By: Tommie Ard RN Entered By: Tommie Ard on 12/27/2022 14:19:17 -------------------------------------------------------------------------------- HPI Details Patient Name: Date of Service: [name], [facility] [name] 12/27/2022 1:30 PM Medical Record Number: [id number] Patient Account Number:  [id number] Date of Birth/Sex: Treating RN: [birth year] (82 y.o. F) Primary Care Provider: [name], [facility] Other Clinician: Referring Provider: Treating Provider/Extender: [name], [facility] Weeks in Treatment: 40 History of Present Illness HPI Description: ADMISSION 06/12/2022 This is an 82 year old woman with a history of CVA, crest syndrome, atrial fibrillation, rocker-bottom foot deformity. 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 She has been followed by podiatry for this and I am not entirely clear as to how she came to be referred here. She resides in an assisted living facility. It is not clear what they have been putting on her wound, but on intake, she was noted to have denuded skin on her medial third toe, as well as ulcers on her dorsal great toe and lateral great toe. There is slough accumulation on both of the toe ulcers. There was an odor noted at intake, but after her foot was washed, the odor dissipated. Her toes are folded on top of each other creating areas of abrasion and pockets for moisture collection, which seems to be the primary cause of her ulceration. 06/20/2022: The skin between her toes and on the ball of her foot is completely macerated.  There has been more tissue breakdown. The wound on her great toe has some slough accumulation. 06/27/2022: No change to her wounds today. There has been no further deterioration, but no significant improvement. She was both hypotensive and bradycardic on intake. 07/11/2022: Today, her foot is completely macerated. She reports that the wound care nurse actually soaked her foot and then applied foam, despite our specific orders to not use foam at all. She also is draining serous fluid from both legs and has 2+ pitting edema. She is on furosemide 20 mg twice a day. 07/19/2022: Once again, her foot is completely macerated. There has been further tissue breakdown to the second and third  toes and she now has ulcers on the distal ends. The wounds on her medial and lateral great toe have thick slough accumulation. Apparently Xeroform was found between her toes on intake. Edema control is improved, but still not perfect. No overt drainage from her legs appreciated on exam today. 07/27/2022: She has less tissue maceration today. Edema control in her bilateral lower extremities is improved. Still with slough accumulation on all open wound surfaces. 08/08/2022: Significant improvement this week. She has very little tissue maceration and according to her aide, there has been very little drainage from her legs. There is some slough accumulation on the medial great toe ulcer. Edema control is improved bilaterally. She is spending more time in her bed with her legs elevated and less in her wheelchair with her legs in a dependent position. 08/20/2022: The edema in her legs is now well-controlled with the use of the zinc Unna boots. Unfortunately, this seems to have resulted in more drainage coming from the open areas of her feet. They are a bit macerated but there has not been as much tissue breakdown secondary to moisture as we have seen on previous visits. 08/28/2022: The only remaining open wound in her foot is on the dorsal great toe. The improvement in the rest of the foot is quite dramatic, with no tissue maceration or breakdown. She has been elevating her legs and wearing compression wraps. Edema control is excellent and there has been no drainage from her legs. 09/05/2022: Unfortunately, the distal half of her dorsal foot has broken down and has a layer of slough on the surface. This appears to be secondary to moisture accumulation. Her dressing was completely saturated. 09/20/2022: There has been further deterioration of her foot. The dressing that was applied was bizarre and involved Coflex foam wrapped around her foot to the ankle and then Kerlix and Coban over that to the knee. She  fortunately did have silver alginate between her toes but the tissue breakdown from moisture is extensive. 09/25/2022: There has been massive improvement in her foot since last week. The maceration has decreased, edema control is markedly better, and the wounds [name], [name] ([id number]) [phone].pdf Page 5 of 17 are showing evidence of healing, as opposed to worsening. 10/03/2022: The wound on her great toe is much smaller with minimal slough accumulation. The dorsal foot is also improving. She has a new ulcer on the plantar surface of her left fourth toe, however, and bone is exposed. Edema control and tissue maceration continues to be significantly better now that we are doing all of the dressing care. 10/09/2022: Unfortunately, it seems that the patient has resumed her habit of sitting in her wheelchair with her legs in a dependent position and there has been a lot more drainage and maceration on her foot. The dorsal foot wounds have expanded  and are deeper. She has a new wound on her second toe and although the initial wound on her great toe has healed, she has a new wound on the lateral aspect of her great toe, immediately adjacent to that on her second toe, suggesting these have been caused by friction of the 2 toes rubbing against each other. Her son is participating in this visit via speaker phone. 10/24/2022: Last week she had a nurse visit due to clinic capacity and it appears that her dressing was done differently by the nurse that saw her then how it is usually performed by her regular nurse. Unfortunately, this meant there was more moisture-related tissue breakdown. The dorsum of her foot is open again and she has a new wound on the lateral aspect of her fifth toe. There is slough accumulation in each of the sites. 11/01/2022: There has been remarkable turnaround since her last visit. All of the wounds are smaller. The wound on the dorsal aspect of her great toe has  closed completely. The wounds on the top of her foot have contracted and are starting to epithelialize. The new wound on the lateral aspect of her fifth toe does have some tendon exposure today that was not appreciated at her last visit. There is more tissue over the exposed bone on the plantar surface of her fourth toe, although bone does still remain exposed. 11/08/2022: All of her wounds are looking better again this week. The exposed bone on the plantar surface of the fourth toe has now been covered with soft tissue. The dorsal foot wounds are epithelializing nicely. The lateral fifth toe wound is smaller. There is slough on all of the surfaces. She had ABIs done in the vascular lab last week. Results are copied here: ABI Findings: +---------+------------------+-----+----------+--------+ Right Rt Pressure (mmHg)IndexWaveform Comment  +---------+------------------+-----+----------+--------+ Brachial 132     +---------+------------------+-----+----------+--------+ PTA 108 0.82 monophasic  +---------+------------------+-----+----------+--------+ DP 81 0.61 monophasic  +---------+------------------+-----+----------+--------+ Great T oe24 0.18 Abnormal   +---------+------------------+-----+----------+--------+ +---------+------------------+-----+----------+--------------------------------+ Left Lt Pressure (mmHg)IndexWaveform Comment  +---------+------------------+-----+----------+--------------------------------+ Brachial 121     +---------+------------------+-----+----------+--------------------------------+ PTA 88 0.67 monophasic  +---------+------------------+-----+----------+--------------------------------+ DP 111 0.84 monophasic  +---------+------------------+-----+----------+--------------------------------+ Great T   unable to obtain due to  oe     bandaging/ wound   +---------+------------------+-----+----------+--------------------------------+ +-------+-----------+-----------+------------+------------+ ABI/TBIT oday's ABIT oday's TBIPrevious ABIPrevious TBI +-------+-----------+-----------+------------+------------+ Right 0.82 0.18    +-------+-----------+-----------+------------+------------+ Left 0.84     +-------+-----------+-----------+------------+------------+ Summary: Right: Resting right ankle-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 The right toe-brachial index is abnormal. Left: Resting left ankle-brachial index indicates mild left lower extremity arterial disease. Unable to obtain TBI due to bandaging/wound. *See table(s) above for measurements and observations. 11/16/2022: All of her wounds are improving. The wound on the plantar surface of the fourth toe has good tissue covering the bone. The dorsal foot areas are epithelializing. The lateral foot ulcer still has muscle exposed; this is probably the most severe of her wounds at this time. She is scheduled to see vascular surgery on August 20. 11/20/2022: The wound on the plantar surface of the fourth toe seems to be closed. The dorsal foot areas are smaller. The lateral foot ulcer has muscle and tendon exposed. The medial great toe ulcer has bone exposed and the lateral great toe ulcer just has some slough on the surface. Unfortunately, the dorsal second toe ulcer now also has tendon exposure. 12/06/2022: The dorsal foot areas continue to contract. The lateral foot ulcer has some tendon exposure, but there is no longer any necrotic muscle  or other nonviable soft tissue. The medial great toe ulcer is about the same with bone exposure. The dorsal second toe ulcer also has bone exposure. She has a new wound on her right anterior tibial surface. The fat layer is exposed but it is fairly small. It is clean without any slough or eschar  accumulation. 12/27/2022: I have not seen this patient in 3 weeks; she had a nurse visit 1 week after the last time I saw her and then has been unable to attend the subsequent visits for various reasons. She did undergo angiography on August 23. 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 There were no options for revascularization. Her wraps were on for 2 weeks and were fairly soupy when they were removed. The plantar fourth toe wound has reopened with bone exposure. The rest of the wounds are all about the same. Electronic Signature(s) Signed: 12/27/2022 3:06:00 PM By: [name] MD FACS Entered By: [name] on 12/27/2022 15:06:00 [name] ([id number]) [id number] Page 6 of 17 -------------------------------------------------------------------------------- Physical Exam Details Patient Name: Date of Service: [name] 12/27/2022 1:30 PM Medical Record Number: [id number] Patient Account Number: [id number] Date of Birth/Sex: Treating RN: 1940/09/01 (82 y.o. F) Primary Care Provider: [name], [facility] Other Clinician: Referring Provider: Treating Provider/Extender: [name] SHO KES, [facility] Weeks in Treatment: 28 Constitutional .Tachycardic, asymptomatic. . . no acute distress. Respiratory Normal work of breathing on room air. Notes 12/27/2022: Her wraps were on for 2 weeks and were fairly soupy when they were removed. The plantar fourth toe wound has reopened with bone exposure. The rest of the wounds are all about the same. Her right anterior tibial wound has healed. Electronic Signature(s) Signed: 12/27/2022 3:08:35 PM By: [name] MD FACS Entered By: [name] on 12/27/2022  15:08:35 -------------------------------------------------------------------------------- Physician Orders Details Patient Name: Date of Service: [name], [facility] [name] 12/27/2022 1:30 PM Medical Record Number: [id number] Patient Account Number: [id number] Date of Birth/Sex: Treating RN: 09-21-1940 (82 y.o. 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 - Dr. Lady Gary - room 2 DO NOT CHANGE DRESSING - Return A Anesthetic (In clinic) Topical Lidocaine 4% applied to wound bed - USED in Clinic [name] [place], [place] ([id number]) [id number] Page 7 of 17 May shower with protection but do not get wound dressing(s) wet. Protect dressing(s) with water repellant cover (for example, large plastic bag) or a cast cover and may then take shower. Edema Control - Lymphedema / SCD / Other Elevate legs to the level of the heart or above for 30 minutes daily and/or when  sitting for 3-4 times a day throughout the day. Avoid standing for long periods of  time. Non Wound Condition Right Lower Extremity Other Non Wound Condition Orders/Instructions: - urgo lite or 3 layer compression wrap on right and left lower leg to be changed by facility Wound Treatment Wound #10 - T Second oe Wound Laterality: Left Cleanser: Soap and Water 1 x Per Week/30 Days Discharge Instructions: May shower and wash wound with dial antibacterial soap and water prior to dressing change. Cleanser: Wound Cleanser 1 x Per Week/30 Days Discharge Instructions: Cleanse the wound with wound cleanser prior to applying a clean dressing using gauze sponges, not tissue or cotton balls. Peri-Wound Care: Sween Lotion (Moisturizing lotion) 1 x Per Week/30 Days Discharge Instructions: Apply moisturizing lotion as directed Topical: Gentamicin 1 x Per Week/30 Days Discharge Instructions: As directed by physician Topical: Mupirocin Ointment 1 x Per Week/30 Days Discharge Instructions: Apply Mupirocin (Bactroban) as instructed Topical: Ketoconazole Cream 2% 1 x Per Week/30 Days Discharge Instructions: Apply Ketoconazole as directed Topical: Triamcinolone 1 x Per Week/30 Days Discharge Instructions: Apply Triamcinolone as directed Topical: zinc 1 x Per Week/30 Days Prim Dressing: Endoform 2x2 in 1 x Per Week/30 Days ary Discharge Instructions: Moisten with saline Secondary Dressing: ABD Pad, 8x10 1 x Per Week/30 Days Discharge Instructions: Apply over primary dressing as directed. Secondary Dressing: Drawtex 4x4 in 1 x Per Week/30 Days Discharge Instructions: Apply over primary dressing as directed. Secondary Dressing: Woven Gauze Sponge, Non-Sterile 4x4 in 1 x Per Week/30 Days Discharge Instructions: can use gauze between toes (if no desired) Secondary Dressing: Zetuvit Plus 4x8 in 1 x Per Week/30 Days Discharge Instructions: Apply over primary dressing as directed. Compression Wrap: Urgo K2 Lite, (equivalent to a 3 layer) two layer compression system, regular 1 x Per Week/30  Days Discharge Instructions: Apply Urgo K2 Lite as directed (alternative to 3 layer compression). Wound #6 - Foot Wound Laterality: Dorsal, Left Cleanser: Soap and Water 1 x Per Week/30 Days Discharge Instructions: May shower and wash wound with dial antibacterial soap and water prior to dressing change. Cleanser: Wound Cleanser 1 x Per Week/30 Days Discharge Instructions: Cleanse the wound with wound cleanser prior to applying a clean dressing using gauze sponges, not tissue or cotton balls. Peri-Wound Care: Sween Lotion (Moisturizing lotion) 1 x Per Week/30 Days Discharge Instructions: Apply moisturizing lotion as directed Topical: Gentamicin 1 x Per Week/30 Days Discharge Instructions: As directed by physician Topical: Mupirocin Ointment 1 x Per Week/30 Days Discharge Instructions: Apply Mupirocin (Bactroban) as instructed Topical: Ketoconazole Cream 2% 1 x Per Week/30 Days Discharge Instructions: Apply Ketoconazole as directed Topical: Triamcinolone 1 x Per Week/30 Days Discharge Instructions: Apply Triamcinolone as directed Topical: zinc 1 x Per Week/30 Days Bergey, [name] ([id number]) [phone].pdf Page 8 of 17 Prim Dressing: Maxorb Extra Ag+ Alginate Dressing, 4x4.75 (in/in) 1 x Per Week/30 Days ary Discharge Instructions: Apply to wound bed as instructed Secondary Dressing: ABD Pad, 8x10 1 x Per Week/30 Days Discharge Instructions: Apply over primary dressing as directed. Secondary Dressing: Drawtex 4x4 in 1 x Per Week/30 Days Discharge Instructions: Apply over primary dressing as directed. Secondary Dressing: Woven Gauze Sponge, Non-Sterile 4x4 in 1 x Per Week/30 Days Discharge Instructions: can use gauze between toes (if no desired) Secondary Dressing: Zetuvit Plus 4x8 in 1 x Per Week/30 Days Discharge Instructions: Apply over primary dressing as directed. Compression Wrap: Urgo K2 Lite, (equivalent to a 3 layer) two layer compression system, regular  1 x Per Week/30 Days Discharge Instructions:  Apply Urgo K2 Lite as directed (alternative to 3 layer compression). Wound #8 - Foot Wound Laterality: Left, Lateral Cleanser: Soap and Water 1 x Per Week/30 Days Discharge Instructions: May shower and wash wound with dial antibacterial soap and water prior to dressing change. Cleanser: Wound Cleanser 1 x Per Week/30 Days Discharge Instructions: Cleanse the wound with wound cleanser prior to applying a clean dressing using gauze sponges, not tissue or cotton balls. Peri-Wound Care: Sween Lotion (Moisturizing lotion) 1 x Per Week/30 Days Discharge Instructions: Apply moisturizing lotion as directed Topical: Gentamicin 1 x Per Week/30 Days Discharge Instructions: As directed by physician Topical: Mupirocin Ointment 1 x Per Week/30 Days Discharge Instructions: Apply Mupirocin (Bactroban) as instructed Topical: Ketoconazole Cream 2% 1 x Per Week/30 Days Discharge Instructions: Apply Ketoconazole as directed Topical: Triamcinolone 1 x Per Week/30 Days Discharge Instructions: Apply Triamcinolone as directed Topical: zinc 1 x Per Week/30 Days Prim Dressing: Endoform 2x2 in 1 x Per Week/30 Days ary Discharge Instructions: Moisten with saline Secondary Dressing: ABD Pad, 8x10 1 x Per Week/30 Days Discharge Instructions: Apply over primary dressing as directed. Secondary Dressing: Drawtex 4x4 in 1 x Per Week/30 Days Discharge Instructions: Apply over primary dressing as directed. Secondary Dressing: Woven Gauze Sponge, Non-Sterile 4x4 in 1 x Per Week/30 Days Discharge Instructions: can use gauze between toes (if no desired) Secondary Dressing: Zetuvit Plus 4x8 in 1 x Per Week/30 Days Discharge Instructions: Apply over primary dressing as directed. Compression Wrap: Urgo K2 Lite, (equivalent to a 3 layer) two layer compression system, regular 1 x Per Week/30 Days Discharge Instructions: Apply Urgo K2 Lite as directed (alternative to 3 layer  compression). Wound #9 - T Great oe Wound Laterality: Left, Lateral Cleanser: Soap and Water 1 x Per Week/30 Days Discharge Instructions: May shower and wash wound with dial antibacterial soap and water prior to dressing change. Cleanser: Wound Cleanser 1 x Per Week/30 Days Discharge Instructions: Cleanse the wound with wound cleanser prior to applying a clean dressing using gauze sponges, not tissue or cotton balls. Peri-Wound Care: Sween Lotion (Moisturizing lotion) 1 x Per Week/30 Days Discharge Instructions: Apply moisturizing lotion as directed Topical: Gentamicin 1 x Per Week/30 Days Discharge Instructions: As directed by physician Topical: Mupirocin Ointment 1 x Per Week/30 Days Discharge Instructions: Apply Mupirocin (Bactroban) as instructed Topical: Ketoconazole Cream 2% 1 x Per Week/30 Days [name], [name] ([id number]) [phone].pdf Page 9 of 17 Discharge Instructions: Apply Ketoconazole as directed Topical: Triamcinolone 1 x Per Week/30 Days Discharge Instructions: Apply Triamcinolone as directed Topical: zinc 1 x Per Week/30 Days Prim Dressing: Endoform 2x2 in 1 x Per Week/30 Days ary Discharge Instructions: Moisten with saline Secondary Dressing: ABD Pad, 8x10 1 x Per Week/30 Days Discharge Instructions: Apply over primary dressing as directed. Secondary Dressing: Drawtex 4x4 in 1 x Per Week/30 Days Discharge Instructions: Apply over primary dressing as directed. Secondary Dressing: Woven Gauze Sponge, Non-Sterile 4x4 in 1 x Per Week/30 Days Discharge Instructions: can use gauze between toes (if no desired) Secondary Dressing: Zetuvit Plus 4x8 in 1 x Per Week/30 Days Discharge Instructions: Apply over primary dressing as directed. Compression Wrap: Urgo K2 Lite, (equivalent to a 3 layer) two layer compression system, regular 1 x Per Week/30 Days Discharge Instructions: Apply Urgo K2 Lite as directed (alternative to 3 layer  compression). Electronic Signature(s) Signed: 12/27/2022 4:20:36 PM By: [name] MD FACS Entered By: [name] on 12/27/2022 15:10:46 -------------------------------------------------------------------------------- Problem List Details Patient Name: Date of Service: [name], [facility] [name] 12/27/2022  1:30 PM Medical Record Number: [id number] Patient Account Number: [id number] Date of Birth/Sex: Treating RN: [birth year] (82 y.o. 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 10/03/2022 No Yes without evidence of necrosis L97.525 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 11/01/2022 No Yes without evidence of necrosis L97.812 Non-pressure chronic ulcer of other part of right lower leg with fat layer 12/06/2022 No Yes exposed 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 06/12/2022 No Yes [name], [name] ([id number]) [phone].pdf Page 10 of [phone] Essential (primary) hypertension 06/12/2022 No Yes 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 06/12/2022 No Yes 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 atrial fibrillation 06/12/2022 No Yes Z79.01 Long term (current) use of anticoagulants 06/12/2022 No Yes I89.0 Lymphedema, not elsewhere classified 08/20/2022 No Yes Inactive Problems ICD-10 Code Description Active Date Inactive Date L97.521 Non-pressure chronic ulcer of other part of left foot limited to breakdown of skin 06/12/2022 06/12/2022 Resolved Problems Electronic Signature(s) Signed: 12/27/2022 2:58:11 PM By: [name] MD FACS Entered By: [name] on 12/27/2022 14:58:11 -------------------------------------------------------------------------------- Progress Note Details Patient Name: Date of Service: [name], [facility] [name] 12/27/2022 1:30 PM Medical Record Number: [id number] Patient Account Number: [id number] Date of Birth/Sex: Treating RN: [birth year] (82 y.o. F) Primary Care Provider: [name], [facility] Other Clinician: Referring Provider: Treating Provider/Extender: [name], [facility] Weeks in Treatment: 87 Subjective Chief Complaint Information obtained from Patient Patient seen for complaints of Non-Healing Wound. History of Present Illness (HPI) ADMISSION 06/12/2022 This is an 82 year old woman with a history of CVA, crest syndrome, atrial fibrillation, rocker-bottom foot deformity. 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 She has been followed by podiatry for this and I am not entirely clear as to how she came to be referred here. She resides in an assisted living facility. It is not clear what they have been putting on her wound, but on intake, she was noted to have denuded skin on her medial third toe, as well as ulcers on her dorsal great toe and lateral great toe. There is slough accumulation on both of the toe ulcers. There was an odor noted at intake, but after her foot was washed, the odor dissipated. Her toes are folded on top of each other creating areas of abrasion and pockets for moisture collection, which seems to be the primary cause of her ulceration. 06/20/2022: The skin between her toes and on the ball of her foot is completely macerated. There has been more tissue breakdown. The wound on her great toe has some slough accumulation. 06/27/2022: No change to her wounds today. There has been no further deterioration, but no significant improvement. She was both hypotensive and bradycardic on intake. 07/11/2022: Today, her foot is completely macerated. She  reports that the wound care nurse actually soaked her foot and then applied foam, despite our specific orders to not use foam at all. She also is draining serous fluid from both legs and has 2+ pitting edema. She is on furosemide 20 mg twice a day. CHRISELDA, TRUSLER ([id number]) [id number] Page 11 of 17 07/19/2022: Once again, her foot is completely macerated. There has been further tissue breakdown  to the second and third toes and she now has ulcers on the distal ends. The wounds on her medial and lateral great toe have thick slough accumulation. Apparently Xeroform was found between her toes on intake. Edema control is improved, but still not perfect. No overt drainage from her legs appreciated on exam today. 07/27/2022: She has less tissue maceration today. Edema control in her bilateral lower extremities is improved. Still with slough accumulation on all open wound surfaces. 08/08/2022: Significant improvement this week. She has very little tissue maceration and according to her aide, there has been very little drainage from her legs. There is some slough accumulation on the medial great toe ulcer. Edema control is improved bilaterally. She is spending more time in her bed with her legs elevated and less in her wheelchair with her legs in a dependent position. 08/20/2022: The edema in her legs is now well-controlled with the use of the zinc Unna boots. Unfortunately, this seems to have resulted in more drainage coming from the open areas of her feet. They are a bit macerated but there has not been as much tissue breakdown secondary to moisture as we have seen on previous visits. 08/28/2022: The only remaining open wound in her foot is on the dorsal great toe. The improvement in the rest of the foot is quite dramatic, with no tissue maceration or breakdown. She has been elevating her legs and wearing compression wraps. Edema control is excellent and there has been no drainage  from her legs. 09/05/2022: Unfortunately, the distal half of her dorsal foot has broken down and has a layer of slough on the surface. This appears to be secondary to moisture accumulation. Her dressing was completely saturated. 09/20/2022: There has been further deterioration of her foot. The dressing that was applied was bizarre and involved Coflex foam wrapped around her foot to the ankle and then Kerlix and Coban over that to the knee. She fortunately did have silver alginate between her toes but the tissue breakdown from moisture is extensive. 09/25/2022: There has been massive improvement in her foot since last week. The maceration has decreased, edema control is markedly better, and the wounds are showing evidence of healing, as opposed to worsening. 10/03/2022: The wound on her great toe is much smaller with minimal slough accumulation. The dorsal foot is also improving. She has a new ulcer on the plantar surface of her left fourth toe, however, and bone is exposed. Edema control and tissue maceration continues to be significantly better now that we are doing all of the dressing care. 10/09/2022: Unfortunately, it seems that the patient has resumed her habit of sitting in her wheelchair with her legs in a dependent position and there has been a lot more drainage and maceration on her foot. The dorsal foot wounds have expanded and are deeper. She has a new wound on her second toe and although the initial wound on her great toe has healed, she has a new wound on the lateral aspect of her great toe, immediately adjacent to that on her second toe, suggesting these have been caused by friction of the 2 toes rubbing against each other. Her son is participating in this visit via speaker phone. 10/24/2022: Last week she had a nurse visit due to clinic capacity and it appears that her dressing was done differently by the nurse that saw her then how it is usually performed by her regular nurse. Unfortunately,  this meant there was more moisture-related tissue breakdown. The dorsum of  her foot is open again and she has a new wound on the lateral aspect of her fifth toe. There is slough accumulation in each of the sites. 11/01/2022: There has been remarkable turnaround since her last visit. All of the wounds are smaller. The wound on the dorsal aspect of her great toe has closed completely. The wounds on the top of her foot have contracted and are starting to epithelialize. The new wound on the lateral aspect of her fifth toe does have some tendon exposure today that was not appreciated at her last visit. There is more tissue over the exposed bone on the plantar surface of her fourth toe, although bone does still remain exposed. 11/08/2022: All of her wounds are looking better again this week. The exposed bone on the plantar surface of the fourth toe has now been covered with soft tissue. The dorsal foot wounds are epithelializing nicely. The lateral fifth toe wound is smaller. There is slough on all of the surfaces. She had ABIs done in the vascular lab last week. Results are copied here: ABI Findings: +---------+------------------+-----+----------+--------+ Right Rt Pressure (mmHg)IndexWaveform Comment  +---------+------------------+-----+----------+--------+ Brachial 132     +---------+------------------+-----+----------+--------+ PTA 108 0.82 monophasic  +---------+------------------+-----+----------+--------+ DP 81 0.61 monophasic  +---------+------------------+-----+----------+--------+ Great T oe24 0.18 Abnormal   +---------+------------------+-----+----------+--------+ +---------+------------------+-----+----------+--------------------------------+ Left Lt Pressure (mmHg)IndexWaveform Comment  +---------+------------------+-----+----------+--------------------------------+ Brachial 121      +---------+------------------+-----+----------+--------------------------------+ PTA 88 0.67 monophasic  +---------+------------------+-----+----------+--------------------------------+ DP 111 0.84 monophasic  +---------+------------------+-----+----------+--------------------------------+ Great T   unable to obtain due to  oe     bandaging/ wound  +---------+------------------+-----+----------+--------------------------------+ +-------+-----------+-----------+------------+------------+ ABI/TBIT oday's ABIT oday's TBIPrevious ABIPrevious TBI +-------+-----------+-----------+------------+------------+ Right 0.82 0.18    +-------+-----------+-----------+------------+------------+ Left 0.84     +-------+-----------+-----------+------------+------------+ Summary: Right: Resting right ankle-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 The right toe-brachial index is abnormal. Left: Resting left ankle-brachial index indicates mild left lower extremity arterial disease. LULUBELLE, DILLAVOU ([id number]) [id number] Page 12 of 17 Unable to obtain TBI due to bandaging/wound. *See table(s) above for measurements and observations. 11/16/2022: All of her wounds are improving. The wound on the plantar surface of the fourth toe has good tissue covering the bone. The dorsal foot areas are epithelializing. The lateral foot ulcer still has muscle exposed; this is probably the most severe of her wounds at this time. She is scheduled to see vascular surgery on August 20. 11/20/2022: The wound on the plantar surface of the fourth toe seems to be closed. The dorsal foot areas are smaller. The lateral foot ulcer has muscle and tendon exposed. The medial great toe ulcer has bone exposed and the lateral great toe ulcer just has some slough on the surface. Unfortunately, the dorsal second toe ulcer now also has tendon  exposure. 12/06/2022: The dorsal foot areas continue to contract. The lateral foot ulcer has some tendon exposure, but there is no longer any necrotic muscle or other nonviable soft tissue. The medial great toe ulcer is about the same with bone exposure. The dorsal second toe ulcer also has bone exposure. She has a new wound on her right anterior tibial surface. The fat layer is exposed but it is fairly small. It is clean without any slough or eschar accumulation. 12/27/2022: I have not seen this patient in 3 weeks; she had a nurse visit 1 week after the last time I saw her and then has been unable to attend the subsequent visits for various reasons. She did undergo angiography on August 23. 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 There were no options for revascularization. Her wraps were on for 2 weeks and were fairly soupy when they were removed. The plantar fourth toe wound has reopened with bone exposure. The rest of the wounds are all about the same. Patient History Information obtained from Patient. Family History Unknown History. Social History Former smoker - smoked when she was young, Marital Status - Widowed, Alcohol Use - Never, Drug Use - No History, Caffeine Use - Daily. Medical History Hematologic/Lymphatic Patient has history of Anemia Cardiovascular Patient has history of Angina - a-fib, Hypertension, Vasculitis Endocrine Denies history of Type I Diabetes, Type II Diabetes Musculoskeletal Patient has history of Osteoarthritis Hospitalization/Surgery History - cholecystectomy. - leg surgery. - tonsillectomy. 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 no acute distress. Vitals Time Taken: 1:44 AM, Height: 67 in, Weight: 153 lbs, BMI: 24, Temperature: 98.5 F, Pulse: 111 bpm, Respiratory Rate: 18 breaths/min, Blood Pressure: 116/68 mmHg. Respiratory Normal work of breathing on room air. General Notes: 12/27/2022: Her wraps were on for 2 weeks and were fairly soupy when they were removed. The plantar fourth toe wound has reopened with bone exposure. The rest of the wounds are all about the same. Her right anterior tibial wound has healed. Integumentary (Hair, Skin) Wound #10 status is Open. Original cause of wound was Gradually Appeared. The date acquired was: 09/05/2022. The wound has been in treatment 3 weeks. The wound is located on the Left T Second. The wound measures 0.7cm length x 0.6cm width x 0.1cm depth; 0.33cm^2 area and 0.033cm^3 volume. There is oe bone and Fat Layer (Subcutaneous Tissue) exposed. There is no tunneling or undermining noted. There is a medium amount of serosanguineous drainage noted. The wound margin is distinct with the outline attached to the wound base. There is small (1-33%) red granulation within the wound bed. There is a large (67-100%) amount of necrotic tissue within the wound bed including Adherent Slough. The periwound skin appearance had no abnormalities noted for texture. The periwound skin appearance had no abnormalities noted for moisture. The periwound skin appearance had no abnormalities noted for color. Periwound temperature was noted as No Abnormality. The periwound has tenderness on palpation. Wound #11 status is Healed - Epithelialized. Original cause of wound was Gradually Appeared. The date acquired was: 12/06/2022. The wound has been in treatment 3 weeks. The wound is located on the Right,Anterior Lower Leg. The wound measures 0cm length x 0cm width x 0cm depth; 0cm^2 area and 0cm^3 Yniguez, Arlin ([id number])  [phone].pdf Page 13 of 17 volume. There is Fat Layer (Subcutaneous Tissue) exposed. There is no tunneling or undermining noted. There is a medium amount of serosanguineous drainage noted. The wound margin is distinct with the outline attached to the wound base. There is large (67-100%) red granulation within the wound bed. There is a small (1-33%) amount of necrotic tissue within the wound bed. The periwound skin appearance had no abnormalities noted for texture. The periwound skin appearance had no abnormalities noted for color. The periwound skin appearance exhibited: Dry/Scaly. Periwound temperature was noted as No Abnormality. Wound #6 status is Open. Original cause of wound was Gradually Appeared. The date acquired was: 09/05/2022. The wound has been in treatment 16 weeks. The wound is located on the Left,Dorsal Foot. The wound measures 3.5cm  length x 4.5cm width x 0.1cm depth; 12.37cm^2 area and 1.237cm^3 volume. There is Fat Layer (Subcutaneous Tissue) exposed. There is no tunneling or undermining noted. There is a medium amount of purulent drainage noted. The wound margin is indistinct and nonvisible. There is medium (34-66%) red, pink granulation within the wound bed. There is a medium (34-66%) amount of necrotic tissue within the wound bed including Adherent Slough. The periwound skin appearance had no abnormalities noted for texture. The periwound skin appearance had no abnormalities noted for moisture. The periwound skin appearance exhibited: Rubor. Periwound temperature was noted as Cool/Cold. The periwound has tenderness on palpation. Wound #8 status is Open. Original cause of wound was Pressure Injury. The date acquired was: 10/24/2022. The wound has been in treatment 9 weeks. The wound is located on the Left,Lateral Foot. The wound measures 1.3cm length x 1cm width x 0.3cm depth; 1.021cm^2 area and 0.306cm^3 volume. There is tendon and Fat Layer (Subcutaneous  Tissue) exposed. There is no tunneling or undermining noted. There is a medium amount of purulent drainage noted. The wound margin is distinct with the outline attached to the wound base. There is small (1-33%) red granulation within the wound bed. There is a large (67-100%) amount of necrotic tissue within the wound bed including Adherent Slough. The periwound skin appearance had no abnormalities noted for texture. The periwound skin appearance had no abnormalities noted for moisture. The periwound skin appearance had no abnormalities noted for color. Periwound temperature was noted as No Abnormality. The periwound has tenderness on palpation. Wound #9 status is Open. Original cause of wound was Gradually Appeared. The date acquired was: 11/01/2022. The wound has been in treatment 8 weeks. The wound is located on the [employer]. The wound measures 0.2cm length x 0.3cm width x 0.1cm depth; 0.047cm^2 area and 0.005cm^3 volume. There oe is bone and Fat Layer (Subcutaneous Tissue) exposed. There is no tunneling or undermining noted. There is a small amount of serosanguineous drainage noted. The wound margin is flat and intact. There is small (1-33%) pink granulation within the wound bed. There is a large (67-100%) amount of necrotic tissue within the wound bed. The periwound skin appearance had no abnormalities noted for texture. The periwound skin appearance had no abnormalities noted for moisture. The periwound skin appearance exhibited: Rubor. Periwound temperature was noted as Cool/Cold. The periwound has tenderness on palpation. 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 There was a Selective/Open Wound Non-Viable Tissue Debridement oe with a total area of 0.33 sq cm performed by [name], MD. With the following instrument(s): Curette to remove Non-Viable tissue/material. Material removed includes [facility] after achieving pain control using Lidocaine 4% Topical Solution. No specimens were taken. A time out was conducted at 14:12, prior to the start of the procedure. A Minimum amount of bleeding was controlled with Pressure. The procedure was tolerated well. Post Debridement Measurements: 0.7cm length  x 0.6cm width x 0.1cm depth; 0.033cm^3 volume. Character of Wound/Ulcer Post Debridement requires further debridement. Post procedure Diagnosis Wound #10: Same as Pre-Procedure General Notes: scribed for Dr. Lady Gary by Tommie Ard, RN. Pre-procedure diagnosis of Wound #10 is a Lymphedema located on the Left T Second . There was a Three Layer Compression Therapy Procedure by Marlis Edelson, RN. Post procedure Diagnosis Wound #10: Same as Pre-Procedure Wound #6 Pre-procedure diagnosis of Wound #6 is a Lymphedema located on the Left,Dorsal Foot . There was a Selective/Open Wound Non-Viable Tissue Debridement with a total area of 12.36 sq cm performed by [name], MD. With the following instrument(s): Curette to remove Non-Viable tissue/material. Material removed includes Eschar and Slough and. No specimens were taken. A time out was conducted at 14:12, prior to the start of the procedure. A Minimum amount of bleeding was controlled with Pressure. The procedure was tolerated  well. Post Debridement Measurements: 3.5cm length x 4.5cm width x 0.1cm depth; 1.237cm^3 volume. Character of Wound/Ulcer Post Debridement requires further debridement. Post procedure Diagnosis Wound #6: Same as Pre-Procedure General Notes: scribed for Dr. Lady Gary by Tommie Ard, RN. Pre-procedure diagnosis of Wound #6 is a Lymphedema located on the Left,Dorsal Foot . There was a Three Layer Compression Therapy Procedure by Tommie Ard, RN. 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 There was a Excisional Skin/Subcutaneous Tissue Debridement with a total area of 1.02 sq cm performed by [name], MD. With the following instrument(s): Curette to remove Viable and Non-Viable tissue/material. Material removed includes Subcutaneous Tissue and Slough and. No specimens were taken. A time out was conducted at 14:12, prior to the start of the procedure. A Minimum amount of bleeding was controlled with Pressure. The procedure was tolerated well. Post Debridement Measurements: 1.3cm length x 1cm width x 0.3cm depth; 0.306cm^3 volume. Post debridement Stage noted as Category/Stage IV. [name], [name] ([id number]) [id number] Page 14 of 17 Character of Wound/Ulcer Post Debridement requires further debridement. Post procedure Diagnosis Wound #8: Same as Pre-Procedure General Notes: Scribed for Dr. Lady Gary by Tommie Ard, RN. 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 There was a Three Layer Compression Therapy Procedure by Tommie Ard, RN. 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 There was a Selective/Open Wound Non-Viable Tissue oe Debridement with a total area of 0.05 sq cm performed by  [name], MD. With the following instrument(s): Curette to remove Non-Viable tissue/material. Material removed includes [facility]. No specimens were taken. A time out was conducted at 14:12, prior to the start of the procedure. A Minimum amount of bleeding was controlled with Pressure. The procedure was tolerated well. Post Debridement Measurements: 0.2cm length x 0.3cm width x 0.1cm depth; 0.005cm^3 volume. Character of Wound/Ulcer Post Debridement requires further debridement. Post procedure Diagnosis Wound #9: Same as Pre-Procedure General Notes: Scribed for Dr. Lady Gary by Tommie Ard, RN. 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 There was a Three Layer Compression Therapy Procedure by Marlis Edelson, RN. Post procedure Diagnosis Wound #9: Same as Pre-Procedure Plan Follow-up Appointments: Return Appointment in 1 week. - Dr. Lady Gary - room 2 DO NOT CHANGE DRESSING - Anesthetic: (In clinic) Topical Lidocaine 4% applied to wound bed - USED in Clinic Bathing/  Shower/ Hygiene: May shower with protection but do not get wound dressing(s) wet. Protect dressing(s) with water repellant cover (for example, large plastic bag) or a cast cover and may then take shower. Edema Control - Lymphedema / SCD / Other: Elevate legs to the level of the heart or above for 30 minutes daily and/or when sitting for 3-4 times a day throughout the day. Avoid standing for long periods of time. Non Wound Condition: Other Non Wound Condition Orders/Instructions: - urgo lite or 3 layer compression wrap on right and left lower leg to be changed by facility WOUND #10: - T Second Wound Laterality: Left oe Cleanser: Soap and Water 1 x Per Week/30 Days Discharge Instructions: May shower and wash wound with dial antibacterial soap and water prior to dressing change. Cleanser: Wound Cleanser 1 x Per Week/30 Days Discharge Instructions: Cleanse the wound with wound  cleanser prior to applying a clean dressing using gauze sponges, not tissue or cotton balls. Peri-Wound Care: Sween Lotion (Moisturizing lotion) 1 x Per Week/30 Days Discharge Instructions: Apply moisturizing lotion as directed Topical: Gentamicin 1 x Per Week/30 Days Discharge Instructions: As directed by physician Topical: Mupirocin Ointment 1 x Per Week/30 Days Discharge Instructions: Apply Mupirocin (Bactroban) as instructed Topical: Ketoconazole Cream 2% 1 x Per Week/30 Days Discharge Instructions: Apply Ketoconazole as directed Topical: Triamcinolone 1 x Per Week/30 Days Discharge Instructions: Apply Triamcinolone as directed Topical: zinc 1 x Per Week/30 Days Prim Dressing: Endoform 2x2 in 1 x Per Week/30 Days ary Discharge Instructions: Moisten with saline Secondary Dressing: ABD Pad, 8x10 1 x Per Week/30 Days Discharge Instructions: Apply over primary dressing as directed. Secondary Dressing: Drawtex 4x4 in 1 x Per Week/30 Days Discharge Instructions: Apply over primary dressing as directed. Secondary Dressing: Woven Gauze Sponge, Non-Sterile 4x4 in 1 x Per Week/30 Days Discharge Instructions: can use gauze between toes (if no desired) Secondary Dressing: Zetuvit Plus 4x8 in 1 x Per Week/30 Days Discharge Instructions: Apply over primary dressing as directed. Com pression Wrap: Urgo K2 Lite, (equivalent to a 3 layer) two layer compression system, regular 1 x Per Week/30 Days Discharge Instructions: Apply Urgo K2 Lite as directed (alternative to 3 layer compression). WOUND #6: - Foot Wound Laterality: Dorsal, Left Cleanser: Soap and Water 1 x Per Week/30 Days Discharge Instructions: May shower and wash wound with dial antibacterial soap and water prior to dressing change. Cleanser: Wound Cleanser 1 x Per Week/30 Days Discharge Instructions: Cleanse the wound with wound cleanser prior to applying a clean dressing using gauze sponges, not tissue or cotton balls. Peri-Wound Care:  Sween Lotion (Moisturizing lotion) 1 x Per Week/30 Days Discharge Instructions: Apply moisturizing lotion as directed Topical: Gentamicin 1 x Per Week/30 Days Discharge Instructions: As directed by physician Topical: Mupirocin Ointment 1 x Per Week/30 Days Discharge Instructions: Apply Mupirocin (Bactroban) as instructed Topical: Ketoconazole Cream 2% 1 x Per Week/30 Days Discharge Instructions: Apply Ketoconazole as directed Topical: Triamcinolone 1 x Per Week/30 Days Discharge Instructions: Apply Triamcinolone as directed Topical: zinc 1 x Per Week/30 Days Prim Dressing: Maxorb Extra Ag+ Alginate Dressing, 4x4.75 (in/in) 1 x Per Week/30 Days ary Discharge Instructions: Apply to wound bed as instructed Secondary Dressing: ABD Pad, 8x10 1 x Per Week/30 Days Discharge Instructions: Apply over primary dressing as directed. Secondary Dressing: Drawtex 4x4 in 1 x Per Week/30 Days [name], [name] ([id number]) [phone].pdf Page 15 of 17 Discharge Instructions: Apply over primary dressing as directed. Secondary Dressing: Woven Gauze Sponge, Non-Sterile 4x4 in 1 x Per Week/30  Days Discharge Instructions: can use gauze between toes (if no desired) Secondary Dressing: Zetuvit Plus 4x8 in 1 x Per Week/30 Days Discharge Instructions: Apply over primary dressing as directed. Com pression Wrap: Urgo K2 Lite, (equivalent to a 3 layer) two layer compression system, regular 1 x Per Week/30 Days Discharge Instructions: Apply Urgo K2 Lite as directed (alternative to 3 layer compression). WOUND #8: - Foot Wound Laterality: Left, Lateral Cleanser: Soap and Water 1 x Per Week/30 Days Discharge Instructions: May shower and wash wound with dial antibacterial soap and water prior to dressing change. Cleanser: Wound Cleanser 1 x Per Week/30 Days Discharge Instructions: Cleanse the wound with wound cleanser prior to applying a clean dressing using gauze sponges, not tissue or cotton  balls. Peri-Wound Care: Sween Lotion (Moisturizing lotion) 1 x Per Week/30 Days Discharge Instructions: Apply moisturizing lotion as directed Topical: Gentamicin 1 x Per Week/30 Days Discharge Instructions: As directed by physician Topical: Mupirocin Ointment 1 x Per Week/30 Days Discharge Instructions: Apply Mupirocin (Bactroban) as instructed Topical: Ketoconazole Cream 2% 1 x Per Week/30 Days Discharge Instructions: Apply Ketoconazole as directed Topical: Triamcinolone 1 x Per Week/30 Days Discharge Instructions: Apply Triamcinolone as directed Topical: zinc 1 x Per Week/30 Days Prim Dressing: Endoform 2x2 in 1 x Per Week/30 Days ary Discharge Instructions: Moisten with saline Secondary Dressing: ABD Pad, 8x10 1 x Per Week/30 Days Discharge Instructions: Apply over primary dressing as directed. Secondary Dressing: Drawtex 4x4 in 1 x Per Week/30 Days Discharge Instructions: Apply over primary dressing as directed. Secondary Dressing: Woven Gauze Sponge, Non-Sterile 4x4 in 1 x Per Week/30 Days Discharge Instructions: can use gauze between toes (if no desired) Secondary Dressing: Zetuvit Plus 4x8 in 1 x Per Week/30 Days Discharge Instructions: Apply over primary dressing as directed. Com pression Wrap: Urgo K2 Lite, (equivalent to a 3 layer) two layer compression system, regular 1 x Per Week/30 Days Discharge Instructions: Apply Urgo K2 Lite as directed (alternative to 3 layer compression). WOUND #9: - T Great Wound Laterality: Left, Lateral oe Cleanser: Soap and Water 1 x Per Week/30 Days Discharge Instructions: May shower and wash wound with dial antibacterial soap and water prior to dressing change. Cleanser: Wound Cleanser 1 x Per Week/30 Days Discharge Instructions: Cleanse the wound with wound cleanser prior to applying a clean dressing using gauze sponges, not tissue or cotton balls. Peri-Wound Care: Sween Lotion (Moisturizing lotion) 1 x Per Week/30 Days Discharge  Instructions: Apply moisturizing lotion as directed Topical: Gentamicin 1 x Per Week/30 Days Discharge Instructions: As directed by physician Topical: Mupirocin Ointment 1 x Per Week/30 Days Discharge Instructions: Apply Mupirocin (Bactroban) as instructed Topical: Ketoconazole Cream 2% 1 x Per Week/30 Days Discharge Instructions: Apply Ketoconazole as directed Topical: Triamcinolone 1 x Per Week/30 Days Discharge Instructions: Apply Triamcinolone as directed Topical: zinc 1 x Per Week/30 Days Prim Dressing: Endoform 2x2 in 1 x Per Week/30 Days ary Discharge Instructions: Moisten with saline Secondary Dressing: ABD Pad, 8x10 1 x Per Week/30 Days Discharge Instructions: Apply over primary dressing as directed. Secondary Dressing: Drawtex 4x4 in 1 x Per Week/30 Days Discharge Instructions: Apply over primary dressing as directed. Secondary Dressing: Woven Gauze Sponge, Non-Sterile 4x4 in 1 x Per Week/30 Days Discharge Instructions: can use gauze between toes (if no desired) Secondary Dressing: Zetuvit Plus 4x8 in 1 x Per Week/30 Days Discharge Instructions: Apply over primary dressing as directed. Com pression Wrap: Urgo K2 Lite, (equivalent to a 3 layer) two layer compression system, regular 1 x Per Week/30  Days Discharge Instructions: Apply Urgo K2 Lite as directed (alternative to 3 layer compression). 12/27/2022: I have not seen this patient in 3 weeks; she had a nurse visit 1 week after the last time I saw her and then has been unable to attend the subsequent visits for various reasons. She did undergo angiography on August 23. 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 There were no options for revascularization. Her wraps were on for 2 weeks and were fairly soupy when they were removed. The plantar fourth toe  wound has reopened with bone exposure. The rest of the wounds are all about the same. The right anterior tibial wound has healed. I used a curette to debride slough and eschar from the dorsal foot wounds, slough and subcutaneous tissue from the lateral foot wound and the fourth toe plantar wound. I debrided slough and eschar from the left great toe and left second toe sites. We will continue the mixture of topical gentamicin and mupirocin with endoform to her wounds. Follow-up in 1 week. Electronic Signature(s) Signed: 12/27/2022 3:12:08 PM By: [name] MD FACS Entered By: [name] on 12/27/2022 15:12:08 [name], [name] ([id number]) [id number] Page 16 of 17 -------------------------------------------------------------------------------- HxROS Details Patient Name: Date of Service: [name] 12/27/2022 1:30 PM Medical Record Number: [id number] Patient Account Number: [id number] Date of Birth/Sex: Treating RN: 1940/12/30 (82 y.o. 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 Endocrine Medical History: Negative for: Type I Diabetes; Type II Diabetes Past Medical History Notes: hypothyroidism Musculoskeletal Medical History: Positive for: 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 Neurologic Medical History: Past Medical History Notes: stroke Immunizations Pneumococcal Vaccine: Received Pneumococcal Vaccination: Yes Received Pneumococcal Vaccination On or After 60th Birthday: Yes Implantable Devices None Hospitalization / Surgery History Type of  Hospitalization/Surgery cholecystectomy leg surgery tonsillectomy Family and Social History Unknown History: Yes; Former smoker - smoked when she was young; Marital Status - Widowed; Alcohol Use: Never; Drug Use: No History; Caffeine Use: Daily; Financial Concerns: No; Food, Clothing or Shelter Needs: No; Support System Lacking: No; Transportation Concerns: No Electronic Signature(s) Signed: 12/27/2022 4:20:36 PM By: [name] MD FACS Entered By: [name] on 12/27/2022 15:07:38 Boggess, Kabao ([id number]) [id number] Page 17 of 17 -------------------------------------------------------------------------------- SuperBill Details Patient Name: Date of Service: [name] 12/27/2022 Medical Record Number: [id number] Patient Account Number: [id number] Date of Birth/Sex: Treating RN: January 31, 1941 (82 y.o. F) Primary Care Provider: [name], 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 I10 Essential (primary) hypertension 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 atrial fibrillation Z79.01 Long term (current) use of anticoagulants I89.0  Lymphedema, not elsewhere classified Facility Procedures : CPT4 Code: [id number] Description: 11042 - DEB SUBQ TISSUE 20 SQ CM/< ICD-10 Diagnosis Description L97.525 Non-pressure chronic ulcer of other part  of left foot with muscle involvement wit Modifier: hout evidence of ne Quantity: 1 crosis : CPT4 Code: [id number] Description: 97597 - DEBRIDE WOUND 1ST 20 SQ CM OR < ICD-10 Diagnosis Description 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  witho Modifier: ut evidence of necr Quantity: 1 osis Physician Procedures : CPT4 Code Description Modifier [id number] 99214 - WC PHYS LEVEL 4 - EST PT 25 ICD-10 Diagnosis Description L97.522 Non-pressure chronic ulcer of other part of left foot with fat layer exposed L97.526 Non-pressure chronic ulcer of other part of left foot  with bone involvement without evidence of necros L97.525 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 without evidence of necr I89.0 Lymphedema, not elsewhere classified Quantity: 1 is osis : [id number] 11042 - WC PHYS SUBQ TISS 20 SQ CM ICD-10 Diagnosis Description L97.525 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 without evidence of necr Quantity: 1 osis : [id number] 97597 - WC PHYS DEBR WO ANESTH 20 SQ CM ICD-10 Diagnosis Description 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  without evidence of necros Quantity: 1 is Electronic Signature(s) Signed: 12/27/2022 3:12:48 PM By: [name] MD FACS Entered By: [name] on 12/27/2022 15:12:48

## 2023-01-02 NOTE — Progress Notes (Signed)
LUS, JEANES ([id number]) [id number] Page 1 of 19 Visit Report for 12/27/2022 Arrival Information Details Patient Name: Date of Service: [name] RO N 12/27/2022 1:30 PM Medical Record Number: [id number] Patient Account Number: [id number] Date of Birth/Sex: Treating RN: 01/18/1941 (82 y.o. [name] Primary Care Mackenzye Mackel: [name], [facility] Other Clinician: Referring Maleyah Evans: Treating Lunette Tapp/Extender: [name], [facility] Weeks in Treatment: 28 Visit Information History Since Last Visit Added or deleted any medications: No Patient Arrived: Wheel Chair Any new allergies or adverse reactions: No Arrival Time: 13:42 Had a fall or experienced change in No Accompanied By: caregiver activities of daily living that may affect Transfer Assistance: None risk of falls: Patient Requires Transmission-Based Precautions: No Signs or symptoms of abuse/neglect since last visito No Patient Has Alerts: Yes Hospitalized since last visit: No Patient Alerts: ABIs: R:0.82 L:0.84 7/24 Has Compression in Place as Prescribed: Yes Pain Present Now: Yes Electronic Signature(s) Signed: 01/02/2023 8:25:20 AM By: Tommie Ard RN Entered By: Tommie Ard on 12/27/2022 13:43:11 -------------------------------------------------------------------------------- Compression Therapy Details Patient Name: Date of Service: [name] [facility] RO N 12/27/2022 1:30 PM Medical Record Number: [id number] Patient Account Number: [id number] Date of Birth/Sex: Treating RN: [birth year] (81 y.o. [name] Primary Care Huldah Marin: [name], [facility] Other Clinician: Referring Mireya Meditz: Treating Keiley Levey/Extender: [name] SHO [name], [facility] Weeks in Treatment: 28 Compression Therapy Performed for Wound Assessment: Wound #6 Left,Dorsal Foot Performed By: Clinician Tommie Ard, RN Compression Type: Three Layer Post Procedure Diagnosis Same as Pre-procedure Electronic  Signature(s) Signed: 12/27/2022 2:46:05 PM By: Tommie Ard RN Entered By: Tommie Ard on 12/27/2022 14:46:05 -------------------------------------------------------------------------------- Compression Therapy Details Patient Name: Date of Service: [name], [facility] RO N 12/27/2022 1:30 PM Medical Record Number: [id number] Patient Account Number: [id number] [name] ([id number]) [phone].pdf Page 2 of 19 Date of Birth/Sex: Treating RN: April 20, 1941 (82 y.o. [name] Primary Care Geri Hepler: [name], [facility] Other Clinician: Referring Josslynn Mentzer: Treating Kharlie Bring/Extender: [name] SHO [name], [facility] Weeks in Treatment: 28 Compression Therapy Performed for Wound Assessment: Wound #10 Left T Second oe Performed By: Clinician Tommie Ard, RN Compression Type: Three Layer Post Procedure Diagnosis Same as Pre-procedure Electronic Signature(s) Signed: 12/27/2022 2:46:05 PM By: Tommie Ard RN Entered By: Tommie Ard on 12/27/2022 14:46:05 -------------------------------------------------------------------------------- Compression Therapy Details Patient Name: Date of Service: [name] [facility] RO N 12/27/2022 1:30 PM Medical Record Number: [id number] Patient Account Number: [id number] Date of Birth/Sex: Treating RN: 1940-11-07 (82 y.o. [name] Primary Care [name]: [name], [facility] Other Clinician: Referring Adrieanna Boteler: Treating Tenea Sens/Extender: [name] SHO [name], [facility] Weeks in Treatment: 28 Compression Therapy Performed for Wound Assessment: Wound #8 Left,Lateral Foot Performed By: Clinician Tommie Ard, RN Compression Type: Three Layer Post Procedure Diagnosis Same as Pre-procedure Electronic Signature(s) Signed: 12/27/2022 2:46:05 PM By: Tommie Ard RN Entered By: Tommie Ard on 12/27/2022 14:46:05 -------------------------------------------------------------------------------- Compression Therapy Details Patient Name: Date  of Service: [name] [facility] RO N 12/27/2022 1:30 PM Medical Record Number: [id number] Patient Account Number: [id number] Date of Birth/Sex: Treating RN: [birth year] (82 y.o. [name] Primary Care Brionne Mertz: [name], [facility] Other Clinician: Referring Lucille Crichlow: Treating Amorie Rentz/Extender: [name] SHO [name], [facility] Weeks in Treatment: 28 Compression Therapy Performed for Wound Assessment: Wound #9 Left,Lateral T Great oe Performed By: Clinician Tommie Ard, RN Compression Type: Three Layer Post Procedure Diagnosis Same as Pre-procedure Electronic Signature(s) Signed: 12/27/2022 2:46:05 PM By: Tommie Ard RN Entered By: Tommie Ard on 12/27/2022 14:46:05 Kyllo, Amorita ([id number]) [id number] Page 3 of 19 -------------------------------------------------------------------------------- Encounter Discharge  Information Details Patient Name: Date of Service: [name] 12/27/2022 1:30 PM Medical Record Number: [id number] Patient Account Number: [id number] Date of Birth/Sex: Treating RN: [birth year] (82 y.o. [name] Primary Care Yechezkel Fertig: [name], [facility] Other Clinician: Referring [name]: Treating Ascher Schroepfer/Extender: [name] SHO [name], [facility] Weeks in Treatment: 20 Encounter Discharge Information Items Post Procedure Vitals Discharge Condition: Stable Temperature (F): 98.5 Ambulatory Status: Wheelchair Pulse (bpm): 78 Discharge Destination: Home Respiratory Rate (breaths/min): 18 Transportation: Private Auto Blood Pressure (mmHg): 116/68 Accompanied By: caregiver Schedule Follow-up Appointment: Yes Clinical Summary of Care: Electronic Signature(s) Signed: 12/27/2022 2:49:10 PM By: Tommie Ard RN Entered By: Tommie Ard on 12/27/2022 14:49:10 -------------------------------------------------------------------------------- Lower Extremity Assessment Details Patient Name: Date of Service: [name] RO N 12/27/2022  1:30 PM Medical Record Number: [id number] Patient Account Number: [id number] Date of Birth/Sex: Treating RN: Feb 08, 1941 (82 y.o. [name] Primary Care Devontay Celaya: [name], [facility] Other Clinician: Referring Mikko Lewellen: Treating Bernerd Terhune/Extender: [name] SHO [name], [facility] Weeks in Treatment: 28 Edema Assessment Assessed: [Left: No] [Right: No] Edema: [Left: Ye] [Right: s] Calf Left: Right: Point of Measurement: From Medial Instep 27 cm 27.5 cm Ankle Left: Right: Point of Measurement: From Medial Instep 19.5 cm 19 cm Vascular Assessment Pulses: Dorsalis Pedis Palpable: [Left:Yes] [Right:Yes] Extremity colors, hair growth, and conditions: Extremity Color: [Left:Hyperpigmented] Hair Growth on Extremity: [Left:No] Temperature of Extremity: [Left:Cool] Capillary Refill: [Left:> 3 seconds] Dependent Rubor: [Left:Yes No] Electronic Signature(s) Muns, Debrina ([id number]) [id number] Page 4 of 19 Signed: 01/02/2023 8:25:20 AM By: Tommie Ard RN Entered By: Tommie Ard on 12/27/2022 13:51:02 -------------------------------------------------------------------------------- Multi Wound Chart Details Patient Name: Date of Service: [place] [name], [facility] RO N 12/27/2022 1:30 PM Medical Record Number: [id number] Patient Account Number: [id number] Date of Birth/Sex: Treating RN: [birth year] (82 y.o. F) Primary Care Lachlan Pelto: [name], [facility] Other Clinician: Referring Stefannie Defeo: Treating Sholonda Jobst/Extender: [name], [facility] Weeks in Treatment: 28 Vital Signs Height(in): 67 Pulse(bpm): 111 Weight(lbs): 153 Blood Pressure(mmHg): 116/68 Body Mass Index(BMI): 24 Temperature(F): 98.5 Respiratory Rate(breaths/min): 18 [10:Photos:] Left T Second oe Right, Anterior Lower Leg Left, Dorsal Foot Wound Location: Gradually Appeared Gradually Appeared Gradually Appeared Wounding Event: Lymphedema Lymphedema Lymphedema Primary Etiology: Anemia, Angina,  Hypertension, Anemia, Angina, Hypertension, Anemia, Angina, Hypertension, Comorbid History: Vasculitis, Osteoarthritis Vasculitis, Osteoarthritis Vasculitis, Osteoarthritis 09/05/2022 12/06/2022 09/05/2022 Date Acquired: 3 3 16  Weeks of Treatment: Open Healed - Epithelialized Open Wound Status: No No No Wound Recurrence: 0.7x0.6x0.1 0x0x0 3.5x4.5x0.1 Measurements L x W x D (cm) 0.33 0 12.37 A (cm) : rea 0.033 0 1.237 Volume (cm) : -68.40% 99.00% 68.20% % Reduction in A rea: -65.00% 98.70% 68.20% % Reduction in Volume: Full Thickness With Exposed Support 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 Classification: Structures Support Structures Support Structures Medium Medium Medium Exudate A mount: Serosanguineous Serosanguineous Purulent Exudate Type: red, brown red, brown yellow, brown, green Exudate Color: Distinct, outline attached Distinct, outline attached Indistinct, nonvisible Wound Margin: Small (1-33%) Large (67-100%) Medium (34-66%) Granulation A mount: Red Red Red, Pink Granulation Quality: Large (67-100%) Small (1-33%) Medium (34-66%) Necrotic A mount: Fat Layer (Subcutaneous Tissue): Yes Fat Layer (Subcutaneous Tissue): Yes Fat Layer (Subcutaneous Tissue): Yes Exposed Structures: Bone: Yes Fascia: No Fascia: No Fascia: No Tendon: No Tendon: No Tendon: No Muscle: No Muscle: No Muscle: No Joint: No Joint: No Joint: No Bone: No Bone: No Small (1-33%) None Medium (34-66%) Epithelialization: Debridement - Selective/Open Wound N/A Debridement - Selective/Open Wound Debridement: Pre-procedure Verification/Time Out 14:12 N/A 14:12 Taken: Lidocaine 4%  Topical Solution N/A N/A Pain Control: Slough N/A Necrotic/Eschar, Slough Tissue Debrided: Non-Viable Tissue N/A Non-Viable Tissue Level: 0.33 N/A 12.36 Debridement A (sq cm): rea Curette N/A Curette Instrument: Minimum N/A Minimum Bleeding: Pressure N/A Pressure Hemostasis A  chieved: Procedure was tolerated well N/A Procedure was tolerated well Debridement Treatment Response: 0.7x0.6x0.1 N/A 3.5x4.5x0.1 Post Debridement Measurements L x Bastidas, Siriyah ([id number]) [phone].pdf Page 5 of 19 W x D (cm) 0.033 N/A 1.237 Post Debridement Volume: (cm) N/A N/A N/A Post Debridement Stage: No Abnormalities Noted No Abnormalities Noted Excoriation: Yes Periwound Skin Texture: No Abnormalities Noted Dry/Scaly: Yes Maceration: Yes Periwound Skin Moisture: No Abnormalities Noted No Abnormalities Noted Rubor: Yes Periwound Skin Color: No Abnormality No Abnormality Cool/Cold Temperature: Yes N/A Yes Tenderness on Palpation: Compression Therapy N/A Compression Therapy Procedures Performed: Debridement Debridement Wound Number: [id number] N/A Photos: N/A Left, Lateral Foot Left, Lateral T Great oe N/A Wound Location: Pressure Injury Gradually Appeared N/A Wounding Event: Pressure Ulcer Auto-immune N/A Primary Etiology: Anemia, Angina, Hypertension, Anemia, Angina, Hypertension, N/A Comorbid History: Vasculitis, Osteoarthritis Vasculitis, Osteoarthritis 10/24/2022 11/01/2022 N/A Date Acquired: 9 8 N/A Weeks of Treatment: Open Open N/A Wound Status: No No N/A Wound Recurrence: 1.3x1x0.3 0.2x0.3x0.1 N/A Measurements L x W x D (cm) 1.021 0.047 N/A A (cm) : rea 0.306 0.005 N/A Volume (cm) : -420.90% 93.20% N/A % Reduction in A rea: -684.60% 92.80% N/A % Reduction in Volume: Category/Stage IV Full Thickness With Exposed Support N/A Classification: Structures Medium Small N/A Exudate A mount: Purulent Serosanguineous N/A Exudate Type: yellow, brown, green red, brown N/A Exudate Color: Distinct, outline attached Flat and Intact N/A Wound Margin: Small (1-33%) Small (1-33%) N/A Granulation A mount: Red Pink N/A Granulation Quality: Large (67-100%) Large (67-100%) N/A Necrotic A mount: Fat Layer (Subcutaneous Tissue): Yes Fat  Layer (Subcutaneous Tissue): Yes N/A Exposed Structures: Tendon: Yes Bone: Yes Fascia: No Fascia: No Muscle: No Tendon: No Joint: No Muscle: No Bone: No Joint: No Small (1-33%) Large (67-100%) N/A Epithelialization: Debridement - Excisional Debridement - Selective/Open Wound N/A Debridement: Pre-procedure Verification/Time Out 14:12 14:12 N/A Taken: N/A N/A N/A Pain Control: Subcutaneous, [employer] N/A Tissue Debrided: Skin/Subcutaneous Tissue Non-Viable Tissue N/A Level: 1.02 0.05 N/A Debridement A (sq cm): rea Curette Curette N/A Instrument: Minimum Minimum N/A Bleeding: Pressure Pressure N/A Hemostasis A chieved: Procedure was tolerated well Procedure was tolerated well N/A Debridement Treatment Response: 1.3x1x0.3 0.2x0.3x0.1 N/A Post Debridement Measurements L x W x D (cm) 0.306 0.005 N/A Post Debridement Volume: (cm) Category/Stage IV N/A N/A Post Debridement Stage: No Abnormalities Noted No Abnormalities Noted N/A Periwound Skin Texture: Dry/Scaly: Yes No Abnormalities Noted N/A Periwound Skin Moisture: No Abnormalities Noted Rubor: Yes N/A Periwound Skin Color: No Abnormality Cool/Cold N/A Temperature: Yes Yes N/A Tenderness on Palpation: Compression Therapy Compression Therapy N/A Procedures Performed: Debridement Debridement Treatment Notes Wound #10 (Toe Second) Wound Laterality: Left Cleanser Soap and Water Discharge Instruction: May shower and wash wound with dial antibacterial soap and water prior to dressing change. Wound Cleanser Sloss, Keeli ([id number]) [id number] Page 6 of 19 Discharge Instruction: Cleanse the wound with wound cleanser prior to applying a clean dressing using gauze sponges, not tissue or cotton balls. Peri-Wound Care Sween Lotion (Moisturizing lotion) Discharge Instruction: Apply moisturizing lotion as directed Topical Gentamicin Discharge Instruction: As directed by  physician Mupirocin Ointment Discharge Instruction: Apply Mupirocin (Bactroban) as instructed Ketoconazole Cream 2% Discharge Instruction: Apply Ketoconazole as directed Triamcinolone Discharge Instruction: Apply Triamcinolone as directed zinc Primary Dressing Endoform 2x2 in Discharge Instruction: Moisten  with saline Secondary Dressing ABD Pad, 8x10 Discharge Instruction: Apply over primary dressing as directed. Drawtex 4x4 in Discharge Instruction: Apply over primary dressing as directed. Woven Gauze Sponge, Non-Sterile 4x4 in Discharge Instruction: can use gauze between toes (if no desired) Zetuvit Plus 4x8 in Discharge Instruction: Apply over primary dressing as directed. Secured With Compression Wrap Urgo K2 Lite, (equivalent to a 3 layer) two layer compression system, regular Discharge Instruction: Apply Urgo K2 Lite as directed (alternative to 3 layer compression). Compression Stockings Add-Ons Wound #11 (Lower Leg) Wound Laterality: Right, Anterior Cleanser Peri-Wound Care Topical Primary Dressing Secondary Dressing Secured With Compression Wrap Compression Stockings Add-Ons Wound #6 (Foot) Wound Laterality: Dorsal, Left Cleanser Soap and Water Discharge Instruction: May shower and wash wound with dial antibacterial soap and water prior to dressing change. Wound Cleanser Discharge Instruction: Cleanse the wound with wound cleanser prior to applying a clean dressing using gauze sponges, not tissue or cotton balls. Peri-Wound Care Sween Lotion (Moisturizing lotion) Discharge Instruction: Apply moisturizing lotion as directed Topical Stangelo, Jaella ([id number]) [phone].pdf Page 7 of 19 Gentamicin Discharge Instruction: As directed by physician Mupirocin Ointment Discharge Instruction: Apply Mupirocin (Bactroban) as instructed Ketoconazole Cream 2% Discharge Instruction: Apply Ketoconazole as directed Triamcinolone Discharge  Instruction: Apply Triamcinolone as directed zinc Primary Dressing Maxorb Extra Ag+ Alginate Dressing, 4x4.75 (in/in) Discharge Instruction: Apply to wound bed as instructed Secondary Dressing ABD Pad, 8x10 Discharge Instruction: Apply over primary dressing as directed. Drawtex 4x4 in Discharge Instruction: Apply over primary dressing as directed. Woven Gauze Sponge, Non-Sterile 4x4 in Discharge Instruction: can use gauze between toes (if no desired) Zetuvit Plus 4x8 in Discharge Instruction: Apply over primary dressing as directed. Secured With Compression Wrap Urgo K2 Lite, (equivalent to a 3 layer) two layer compression system, regular Discharge Instruction: Apply Urgo K2 Lite as directed (alternative to 3 layer compression). Compression Stockings Add-Ons Wound #8 (Foot) Wound Laterality: Left, Lateral Cleanser Soap and Water Discharge Instruction: May shower and wash wound with dial antibacterial soap and water prior to dressing change. Wound Cleanser Discharge Instruction: Cleanse the wound with wound cleanser prior to applying a clean dressing using gauze sponges, not tissue or cotton balls. Peri-Wound Care Sween Lotion (Moisturizing lotion) Discharge Instruction: Apply moisturizing lotion as directed Topical Gentamicin Discharge Instruction: As directed by physician Mupirocin Ointment Discharge Instruction: Apply Mupirocin (Bactroban) as instructed Ketoconazole Cream 2% Discharge Instruction: Apply Ketoconazole as directed Triamcinolone Discharge Instruction: Apply Triamcinolone as directed zinc Primary Dressing Endoform 2x2 in Discharge Instruction: Moisten with saline Secondary Dressing ABD Pad, 8x10 Discharge Instruction: Apply over primary dressing as directed. Drawtex 4x4 in Discharge Instruction: Apply over primary dressing as directed. Woven Gauze Sponge, Non-Sterile 4x4 in Discharge Instruction: can use gauze between toes (if no desired) Zetuvit Plus  4x8 in Discharge Instruction: Apply over primary dressing as directed. KENSI, TULLOS ([id number]) [id number] Page 8 of 19 Secured With Compression Wrap Urgo K2 Lite, (equivalent to a 3 layer) two layer compression system, regular Discharge Instruction: Apply Urgo K2 Lite as directed (alternative to 3 layer compression). Compression Stockings Add-Ons Wound #9 (Toe Great) Wound Laterality: Left, Lateral Cleanser Soap and Water Discharge Instruction: May shower and wash wound with dial antibacterial soap and water prior to dressing change. Wound Cleanser Discharge Instruction: Cleanse the wound with wound cleanser prior to applying a clean dressing using gauze sponges, not tissue or cotton balls. Peri-Wound Care Sween Lotion (Moisturizing lotion) Discharge Instruction: Apply moisturizing lotion as directed Topical Gentamicin Discharge Instruction: As directed by physician Mupirocin Ointment Discharge  Instruction: Apply Mupirocin (Bactroban) as instructed Ketoconazole Cream 2% Discharge Instruction: Apply Ketoconazole as directed Triamcinolone Discharge Instruction: Apply Triamcinolone as directed zinc Primary Dressing Endoform 2x2 in Discharge Instruction: Moisten with saline Secondary Dressing ABD Pad, 8x10 Discharge Instruction: Apply over primary dressing as directed. Drawtex 4x4 in Discharge Instruction: Apply over primary dressing as directed. Woven Gauze Sponge, Non-Sterile 4x4 in Discharge Instruction: can use gauze between toes (if no desired) Zetuvit Plus 4x8 in Discharge Instruction: Apply over primary dressing as directed. Secured With Compression Wrap Urgo K2 Lite, (equivalent to a 3 layer) two layer compression system, regular Discharge Instruction: Apply Urgo K2 Lite as directed (alternative to 3 layer compression). Compression Stockings Add-Ons Electronic Signature(s) Signed: 12/27/2022 3:02:11 PM By: [name] MD  FACS Entered By: [name] on 12/27/2022 15:02:11 Dobransky, [name] ([id number]) [id number] Page 9 of 19 -------------------------------------------------------------------------------- Multi-Disciplinary Care Plan Details Patient Name: Date of Service: [name] 12/27/2022 1:30 PM Medical Record Number: [id number] Patient Account Number: [id number] Date of Birth/Sex: Treating RN: 02/12/1941 (82 y.o. [name] Primary Care Anaid Haney: [name], [facility] Other Clinician: Referring Treson Laura: Treating Victorya Hillman/Extender: [name], [facility] Weeks in Treatment: 95 Multidisciplinary Care Plan reviewed with physician Active Inactive Necrotic Tissue Nursing Diagnoses: Impaired tissue integrity related to necrotic/devitalized tissue Knowledge deficit related to management of necrotic/devitalized tissue Goals: Necrotic/devitalized tissue will be minimized in the wound bed Date Initiated: 06/12/2022 Target Resolution Date: 12/21/2022 Goal Status: Active Patient/caregiver will verbalize understanding of reason and process for debridement of necrotic tissue Date Initiated: 06/12/2022 Target Resolution Date: 12/21/2022 Goal Status: Active Interventions: Assess patient pain level pre-, during and post procedure and prior to discharge Provide education on necrotic tissue and debridement process Treatment Activities: Apply topical anesthetic as ordered : 06/12/2022 Notes: Wound/Skin Impairment Nursing Diagnoses: Impaired tissue integrity Knowledge deficit related to ulceration/compromised skin integrity Goals: Patient/caregiver will verbalize understanding of skin care regimen Date Initiated: 06/12/2022 Target Resolution Date: 12/21/2022 Goal Status: Active Interventions: Assess ulceration(s) every visit Treatment Activities: Skin care regimen initiated : 06/12/2022 Topical wound management initiated : 06/12/2022 Notes: Electronic  Signature(s) Signed: 01/02/2023 8:25:20 AM By: Tommie Ard RN Entered By: Tommie Ard on 12/27/2022 14:04:47 -------------------------------------------------------------------------------- Pain Assessment Details Patient Name: Date of Service: [name] [facility] RO N 12/27/2022 1:30 PM Medical Record Number: [id number] Patient Account Number: [id number] Date of Birth/Sex: Treating RN: 02-Apr-1941 (82 y.o. [name] Primary Care Imunique Samad: [name], [facility] Other Clinician: Referring Ysenia Filice: Treating Buren Havey/Extender: [name], [facility], [name] ([id number]) [id number] Page 10 of 19 Weeks in Treatment: 28 Active Problems Location of Pain Severity and Description of Pain Patient Has Paino Yes Site Locations Pain Location: Pain in Ulcers Character of Pain Describe the Pain: Aching, Burning Pain Management and Medication Current Pain Management: Electronic Signature(s) Signed: 01/02/2023 8:25:20 AM By: Tommie Ard RN Entered By: Tommie Ard on 12/27/2022 13:47:28 -------------------------------------------------------------------------------- Patient/Caregiver Education Details Patient Name: Date of Service: [name] RO N 9/5/2024andnbsp1:30 PM Medical Record Number: [id number] Patient Account Number: [id number] Date of Birth/Gender: Treating RN: [birth year] (82 y.o. [name] Primary Care Physician: [name], [facility] Other Clinician: Referring Physician: Treating Physician/Extender: [name], [facility] Weeks in Treatment: 32 Education Assessment Education Provided To: Patient Education Topics Provided Wound Debridement: Methods: Explain/Verbal Responses: Reinforcements needed, State content correctly Wound/Skin Impairment: Methods: Explain/Verbal Responses: Reinforcements needed, State content correctly Electronic Signature(s) Signed: 01/02/2023 8:25:20 AM By: Tommie Ard RN Entered By: Tommie Ard on 12/27/2022 14:05:11 Hamelin, Cristin ([id number])  [phone].pdf Page 11 of 19 -------------------------------------------------------------------------------- Wound Assessment Details Patient Name: Date of Service: [name] 12/27/2022 1:30 PM Medical Record Number: [id number] Patient Account Number: [id number] Date of Birth/Sex: Treating RN: 1940/12/26 (82 y.o. Roselee Nova, Jamie Primary Care Ayane Delancey: [name], [facility] [facility] Other Clinician: Referring Latrell Potempa: Treating Briellah Baik/Extender: [name] SHO [name], [facility] Weeks in Treatment: 28 Wound Status Wound Number: 10 Primary Etiology: Lymphedema Wound Location: Left T Second oe Wound Status: Open Wounding Event: Gradually Appeared Comorbid History: Anemia, Angina, Hypertension, Vasculitis, Osteoarthritis Date Acquired: 09/05/2022 Weeks Of Treatment: 3 Clustered Wound: No Photos Wound Measurements Length: (cm) Width: (cm) Depth: (cm) Area: (cm) Volume: (cm) 0.7 % Reduction in Area: -68.4% 0.6 % Reduction in Volume: -65% 0.1 Epithelialization: Small (1-33%) 0.33 Tunneling: No 0.033 Undermining: No Wound Description Classification: Full Thickness With Exposed Suppor Wound Margin: Distinct, outline attached Exudate Amount: Medium Exudate Type: Serosanguineous Exudate Color: red, brown t Structures Foul Odor After Cleansing: No Slough/Fibrino Yes Wound Bed Granulation Amount: Small (1-33%) Exposed Structure Granulation Quality: Red Fascia Exposed: No Necrotic Amount: Large (67-100%) Fat Layer (Subcutaneous Tissue) Exposed: Yes Necrotic Quality: Adherent Slough Tendon Exposed: No Muscle Exposed: No Joint Exposed: No Bone Exposed: Yes Periwound Skin Texture Texture Color No Abnormalities Noted: Yes No Abnormalities Noted: Yes Moisture Temperature / Pain No Abnormalities Noted: Yes Temperature: No Abnormality Tenderness on Palpation: Yes Treatment Notes Wound #10 (Toe Second)  Wound Laterality: Left Wigle, Shaquaya ([id number]) [id number] Page 12 of 19 Cleanser Soap and Water Discharge Instruction: May shower and wash wound with dial antibacterial soap and water prior to dressing change. Wound Cleanser Discharge Instruction: Cleanse the wound with wound cleanser prior to applying a clean dressing using gauze sponges, not tissue or cotton balls. Peri-Wound Care Sween Lotion (Moisturizing lotion) Discharge Instruction: Apply moisturizing lotion as directed Topical Gentamicin Discharge Instruction: As directed by physician Mupirocin Ointment Discharge Instruction: Apply Mupirocin (Bactroban) as instructed Ketoconazole Cream 2% Discharge Instruction: Apply Ketoconazole as directed Triamcinolone Discharge Instruction: Apply Triamcinolone as directed zinc Primary Dressing Endoform 2x2 in Discharge Instruction: Moisten with saline Secondary Dressing ABD Pad, 8x10 Discharge Instruction: Apply over primary dressing as directed. Drawtex 4x4 in Discharge Instruction: Apply over primary dressing as directed. Woven Gauze Sponge, Non-Sterile 4x4 in Discharge Instruction: can use gauze between toes (if no desired) Zetuvit Plus 4x8 in Discharge Instruction: Apply over primary dressing as directed. Secured With Compression Wrap Urgo K2 Lite, (equivalent to a 3 layer) two layer compression system, regular Discharge Instruction: Apply Urgo K2 Lite as directed (alternative to 3 layer compression). Compression Stockings Add-Ons Electronic Signature(s) Signed: 01/02/2023 8:25:20 AM By: Tommie Ard RN Entered By: Tommie Ard on 12/27/2022 13:57:37 -------------------------------------------------------------------------------- Wound Assessment Details Patient Name: Date of Service: [name] [facility] RO N 12/27/2022 1:30 PM Medical Record Number: [id number] Patient Account Number: [id number] Date of Birth/Sex: Treating RN: Dec 23, 1940 (82 y.o.  [name] Primary Care Myrissa Chipley: [name], [facility] Other Clinician: Referring Gar Glance: Treating Adolph Clutter/Extender: [name] SHO KES, [facility] Weeks in Treatment: 28 Wound Status Wound Number: 11 Primary Etiology: Lymphedema Wound Location: Right, Anterior Lower Leg Wound Status: Healed - Epithelialized Wounding Event: Gradually Appeared Comorbid History: Anemia, Angina, Hypertension, Vasculitis, Osteoarthritis Date Acquired: 12/06/2022 Weeks Of Treatment: 3 Swarm, Jacqulyne ([id number]) [id number] Page 13 of 19 Clustered Wound: No Photos Wound Measurements Length: (cm) Width: (cm) Depth: (cm) Area: (cm) Volume: (cm) 0 % Reduction in Area: 99% 0 % Reduction in Volume: 98.7% 0 Epithelialization: None 0 Tunneling: No 0 Undermining: No Wound Description Classification:  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 Support Wound Margin: Distinct, outline attached Exudate Amount: Medium Exudate Type: Serosanguineous Exudate Color: red, brown Structures Foul Odor After Cleansing: No Slough/Fibrino Yes Wound Bed Granulation Amount: Large (67-100%) Exposed Structure Granulation Quality: Red Fascia Exposed: No Necrotic Amount: Small (1-33%) Fat Layer (Subcutaneous Tissue) Exposed: Yes Tendon Exposed: No Muscle Exposed: No Joint Exposed: No Bone Exposed: No Periwound Skin Texture Texture Color No Abnormalities Noted: Yes No Abnormalities Noted: Yes Moisture Temperature / Pain No Abnormalities Noted: No Temperature: No Abnormality Dry / Scaly: Yes Treatment Notes Wound #11 (Lower Leg) Wound Laterality: Right, Anterior Cleanser Peri-Wound Care Topical Primary Dressing Secondary Dressing Secured With Compression Wrap Compression Stockings Add-Ons Electronic Signature(s) Signed: 01/02/2023 8:25:20 AM By: Tommie Ard RN Entered By: Tommie Ard on 12/27/2022 14:45:33 Stormer, Kodi ([id number]) [id number] Page 14  of 19 -------------------------------------------------------------------------------- Wound Assessment Details Patient Name: Date of Service: [name] 12/27/2022 1:30 PM Medical Record Number: [id number] Patient Account Number: [id number] Date of Birth/Sex: Treating RN: 1941-04-12 (82 y.o. Roselee Nova, Jamie Primary Care Crisanto Nied: [name], [facility] Other Clinician: Referring Bunny Lowdermilk: Treating Kiylee Thoreson/Extender: [name] SHO [name], [facility] Weeks in Treatment: 28 Wound Status Wound Number: 6 Primary Etiology: Lymphedema Wound Location: Left, Dorsal Foot Wound Status: Open Wounding Event: Gradually Appeared Comorbid History: Anemia, Angina, Hypertension, Vasculitis, Osteoarthritis Date Acquired: 09/05/2022 Weeks Of Treatment: 16 Clustered Wound: No Photos Wound Measurements Length: (cm) 3.5 Width: (cm) 4.5 Depth: (cm) 0.1 Area: (cm) 12.37 Volume: (cm) 1.237 % Reduction in Area: 68.2% % Reduction in Volume: 68.2% Epithelialization: Medium (34-66%) Tunneling: No Undermining: No Wound Description Classification: 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 Support Wound Margin: Indistinct, nonvisible Exudate Amount: Medium Exudate Type: Purulent Exudate Color: yellow, brown, green Structures Foul Odor After Cleansing: No Slough/Fibrino Yes Wound Bed Granulation Amount: Medium (34-66%) Exposed Structure Granulation Quality: Red, Pink Fascia Exposed: No Necrotic Amount: Medium (34-66%) Fat Layer (Subcutaneous Tissue) Exposed: Yes Necrotic Quality: Adherent Slough Tendon Exposed: No Muscle Exposed: No Joint Exposed: No Bone Exposed: No Periwound Skin Texture Texture Color No Abnormalities Noted: Yes No Abnormalities Noted: No Rubor: Yes Moisture No Abnormalities Noted: Yes Temperature / Pain Temperature: Cool/Cold Tenderness on Palpation: Yes Treatment Notes Wound #6 (Foot) Wound Laterality: Dorsal, Left Cleanser Markel, Arieal ([id number])  [id number] Page 15 of 19 Soap and Water Discharge Instruction: May shower and wash wound with dial antibacterial soap and water prior to dressing change. Wound Cleanser Discharge Instruction: Cleanse the wound with wound cleanser prior to applying a clean dressing using gauze sponges, not tissue or cotton balls. Peri-Wound Care Sween Lotion (Moisturizing lotion) Discharge Instruction: Apply moisturizing lotion as directed Topical Gentamicin Discharge Instruction: As directed by physician Mupirocin Ointment Discharge Instruction: Apply Mupirocin (Bactroban) as instructed Ketoconazole Cream 2% Discharge Instruction: Apply Ketoconazole as directed Triamcinolone Discharge Instruction: Apply Triamcinolone as directed zinc Primary Dressing Maxorb Extra Ag+ Alginate Dressing, 4x4.75 (in/in) Discharge Instruction: Apply to wound bed as instructed Secondary Dressing ABD Pad, 8x10 Discharge Instruction: Apply over primary dressing as directed. Drawtex 4x4 in Discharge Instruction: Apply over primary dressing as directed. Woven Gauze Sponge, Non-Sterile 4x4 in Discharge Instruction: can use gauze between toes (if no desired) Zetuvit Plus 4x8 in Discharge Instruction: Apply over primary dressing as directed. Secured With Compression Wrap Urgo K2 Lite, (equivalent to a 3 layer) two layer compression system, regular Discharge Instruction: Apply Urgo K2 Lite as directed (alternative to 3 layer compression). Compression Stockings Add-Ons Electronic Signature(s) Signed: 01/02/2023 8:25:20 AM By: Tommie Ard RN Entered By: Tommie Ard on  12/27/2022 14:00:34 -------------------------------------------------------------------------------- Wound Assessment Details Patient Name: Date of Service: [name] RO N 12/27/2022 1:30 PM Medical Record Number: [id number] Patient Account Number: [id number] Date of Birth/Sex: Treating RN: 12/16/1940 (82 y.o. Roselee Nova,  Jamie Primary Care Talley Kreiser: [name], [facility] Other Clinician: Referring Nazar Kuan: Treating Decie Verne/Extender: [name] SHO [name], [facility] Weeks in Treatment: 28 Wound Status Wound Number: 8 Primary Etiology: Pressure Ulcer Wound Location: Left, Lateral Foot Wound Status: Open Wounding Event: Pressure Injury Comorbid History: Anemia, Angina, Hypertension, Vasculitis, Osteoarthritis Date Acquired: 10/24/2022 Weeks Of Treatment: 9 Clustered Wound: No Cage, Lanitra ([id number]) [id number] Page 16 of 19 Photos Wound Measurements Length: (cm) 1.3 Width: (cm) 1 Depth: (cm) 0.3 Area: (cm) 1.021 Volume: (cm) 0.306 % Reduction in Area: -420.9% % Reduction in Volume: -684.6% Epithelialization: Small (1-33%) Tunneling: No Undermining: No Wound Description Classification: Category/Stage IV Wound Margin: Distinct, outline attached Exudate Amount: Medium Exudate Type: Purulent Exudate Color: yellow, brown, green Foul Odor After Cleansing: No Slough/Fibrino Yes Wound Bed Granulation Amount: Small (1-33%) Exposed Structure Granulation Quality: Red Fascia Exposed: No Necrotic Amount: Large (67-100%) Fat Layer (Subcutaneous Tissue) Exposed: Yes Necrotic Quality: Adherent Slough Tendon Exposed: Yes Muscle Exposed: No Joint Exposed: No Bone Exposed: No Periwound Skin Texture Texture Color No Abnormalities Noted: Yes No Abnormalities Noted: Yes Moisture Temperature / Pain No Abnormalities Noted: Yes Temperature: No Abnormality Tenderness on Palpation: Yes Treatment Notes Wound #8 (Foot) Wound Laterality: Left, Lateral Cleanser Soap and Water Discharge Instruction: May shower and wash wound with dial antibacterial soap and water prior to dressing change. Wound Cleanser Discharge Instruction: Cleanse the wound with wound cleanser prior to applying a clean dressing using gauze sponges, not tissue or cotton balls. Peri-Wound Care Sween Lotion  (Moisturizing lotion) Discharge Instruction: Apply moisturizing lotion as directed Topical Gentamicin Discharge Instruction: As directed by physician Mupirocin Ointment Discharge Instruction: Apply Mupirocin (Bactroban) as instructed Ketoconazole Cream 2% Discharge Instruction: Apply Ketoconazole as directed Triamcinolone Discharge Instruction: Apply Triamcinolone as directed zinc Primary Dressing Endoform 2x2 in Masury, Yaniyah ([id number]) [phone].pdf Page 17 of 19 Discharge Instruction: Moisten with saline Secondary Dressing ABD Pad, 8x10 Discharge Instruction: Apply over primary dressing as directed. Drawtex 4x4 in Discharge Instruction: Apply over primary dressing as directed. Woven Gauze Sponge, Non-Sterile 4x4 in Discharge Instruction: can use gauze between toes (if no desired) Zetuvit Plus 4x8 in Discharge Instruction: Apply over primary dressing as directed. Secured With Compression Wrap Urgo K2 Lite, (equivalent to a 3 layer) two layer compression system, regular Discharge Instruction: Apply Urgo K2 Lite as directed (alternative to 3 layer compression). Compression Stockings Add-Ons Electronic Signature(s) Signed: 01/02/2023 8:25:20 AM By: Tommie Ard RN Entered By: Tommie Ard on 12/27/2022 14:01:15 -------------------------------------------------------------------------------- Wound Assessment Details Patient Name: Date of Service: [name] [facility] RO N 12/27/2022 1:30 PM Medical Record Number: [id number] Patient Account Number: [id number] Date of Birth/Sex: Treating RN: 1940-06-27 (82 y.o. Roselee Nova, Jamie Primary Care Tavarious Freel: [name], [facility] Other Clinician: Referring Darion Milewski: Treating Nancylee Gaines/Extender: [name] SHO [name], [facility] Weeks in Treatment: 28 Wound Status Wound Number: 9 Primary Etiology: Auto-immune Wound Location: Left, Lateral T Great oe Wound Status: Open Wounding Event: Gradually Appeared Comorbid  History: Anemia, Angina, Hypertension, Vasculitis, Osteoarthritis Date Acquired: 11/01/2022 Weeks Of Treatment: 8 Clustered Wound: No Photos Wound Measurements Length: (cm) 0.2 Width: (cm) 0.3 Depth: (cm) 0.1 Area: (cm) 0.047 Volume: (cm) 0.005 % Reduction in Area: 93.2% % Reduction in Volume: 92.8% Epithelialization: Large (67-100%) Tunneling: No Undermining: No Wound Description Reise, Bricelyn ([id number]) Classification: Full Thickness With  Exposed Support Structures Wound Margin: Flat and Intact Exudate Amount: Small Exudate Type: Serosanguineous Exudate Color: red, brown [phone].pdf Page 18 of 19 Foul Odor After Cleansing: No Slough/Fibrino Yes Wound Bed Granulation Amount: Small (1-33%) Exposed Structure Granulation Quality: Pink Fascia Exposed: No Necrotic Amount: Large (67-100%) Fat Layer (Subcutaneous Tissue) Exposed: Yes Tendon Exposed: No Muscle Exposed: No Joint Exposed: No Bone Exposed: Yes Periwound Skin Texture Texture Color No Abnormalities Noted: Yes No Abnormalities Noted: No Rubor: Yes Moisture No Abnormalities Noted: Yes Temperature / Pain Temperature: Cool/Cold Tenderness on Palpation: Yes Treatment Notes Wound #9 (Toe Great) Wound Laterality: Left, Lateral Cleanser Soap and Water Discharge Instruction: May shower and wash wound with dial antibacterial soap and water prior to dressing change. Wound Cleanser Discharge Instruction: Cleanse the wound with wound cleanser prior to applying a clean dressing using gauze sponges, not tissue or cotton balls. Peri-Wound Care Sween Lotion (Moisturizing lotion) Discharge Instruction: Apply moisturizing lotion as directed Topical Gentamicin Discharge Instruction: As directed by physician Mupirocin Ointment Discharge Instruction: Apply Mupirocin (Bactroban) as instructed Ketoconazole Cream 2% Discharge Instruction: Apply Ketoconazole as directed Triamcinolone Discharge  Instruction: Apply Triamcinolone as directed zinc Primary Dressing Endoform 2x2 in Discharge Instruction: Moisten with saline Secondary Dressing ABD Pad, 8x10 Discharge Instruction: Apply over primary dressing as directed. Drawtex 4x4 in Discharge Instruction: Apply over primary dressing as directed. Woven Gauze Sponge, Non-Sterile 4x4 in Discharge Instruction: can use gauze between toes (if no desired) Zetuvit Plus 4x8 in Discharge Instruction: Apply over primary dressing as directed. Secured With Compression Wrap Urgo K2 Lite, (equivalent to a 3 layer) two layer compression system, regular Discharge Instruction: Apply Urgo K2 Lite as directed (alternative to 3 layer compression). Compression Stockings Add-Ons [place], Nataleigh ([id number]) [id number] Page 19 of 19 Electronic Signature(s) Signed: 01/02/2023 8:25:20 AM By: Tommie Ard RN Entered By: Tommie Ard on 12/27/2022 14:06:44 -------------------------------------------------------------------------------- Vitals Details Patient Name: Date of Service: [place] [name], [facility] RO N 12/27/2022 1:30 PM Medical Record Number: [id number] Patient Account Number: [id number] Date of Birth/Sex: Treating RN: 04/04/1941 (82 y.o. Roselee Nova, Jamie Primary Care Nellie Pester: [name], [facility] Other Clinician: Referring Rayshawn Visconti: Treating Celese Banner/Extender: [name] SHO [name], [facility] Weeks in Treatment: 92 Vital Signs Time Taken: 01:44 Temperature (F): 98.5 Height (in): 67 Pulse (bpm): 111 Weight (lbs): 153 Respiratory Rate (breaths/min): 18 Body Mass Index (BMI): 24 Blood Pressure (mmHg): 116/68 Reference Range: 80 - 120 mg / dl Electronic Signature(s) Signed: 12/27/2022 2:47:40 PM By: Tommie Ard RN Entered By: Tommie Ard on 12/27/2022 14:47:40

## 2023-01-03 ENCOUNTER — Ambulatory Visit (HOSPITAL_BASED_OUTPATIENT_CLINIC_OR_DEPARTMENT_OTHER): Payer: Medicare Other | Admitting: General Surgery

## 2023-01-04 ENCOUNTER — Encounter (HOSPITAL_BASED_OUTPATIENT_CLINIC_OR_DEPARTMENT_OTHER): Payer: Medicare Other | Admitting: General Surgery

## 2023-01-04 DIAGNOSIS — L97522 Non-pressure chronic ulcer of other part of left foot with fat layer exposed: Secondary | ICD-10-CM | POA: Diagnosis not present

## 2023-01-04 NOTE — Progress Notes (Signed)
on 01/04/2023 12:18:12 -------------------------------------------------------------------------------- Encounter Discharge Information Details Patient Name: Date of Service: [name] 01/04/2023 11:30 A M Medical Record Number: [id number] Patient Account Number: [id number] Date of Birth/Sex: Treating RN: January 03, 1941 (82 y.o. [name] Primary Care Juniper Cobey: [name], [facility] Other Clinician: Referring Savaughn Karwowski: Treating Layani Foronda/Extender: [name] SHO [name], [facility] Weeks in Treatment: 67 Encounter Discharge Information Items Post Procedure Vitals Discharge Condition: Stable Temperature (F): 98.2 Ambulatory Status: Wheelchair Pulse (bpm): 82 Discharge Destination: Home Respiratory Rate (breaths/min): 18 Transportation: Private Auto Blood Pressure (mmHg): 128/64 Accompanied By: caregiver Schedule Follow-up Appointment: Yes Clinical Summary of Care: Patient Declined Electronic Signature(s) Unsigned Entered By: Brenton Grills on 01/04/2023 12:20:23 -------------------------------------------------------------------------------- Lower  Extremity Assessment Details Patient Name: Date of Service: [name] 01/04/2023 11:30 A M Medical Record Number: [id number] Patient Account Number: [id number] Date of Birth/Sex: Treating RN: [birth year] (82 y.o. [name] Primary Care Avrom Robarts: [name], [facility] Other Clinician: Referring Damare Serano: Treating Shirlette Scarber/Extender: [name] SHO [name], [facility] Weeks in Treatment: 29 Edema Assessment Assessed: [Left: No] [Right: No] Edema: [Left: Ye] [Right: s] Calf Left: Right: Point of Measurement: From Medial Instep 27 cm 27.5 cm Ankle Left: Right: Point of Measurement: From Medial Instep 19.5 cm 19 cm Vascular Assessment Pulses: Dorsalis Pedis [name], [name] ([id number]) [Right:[id number] Page 4 of 18] Palpable: [Left:Yes] [Right:Yes] Extremity colors, hair growth, and conditions: Extremity Color: [Left:Hyperpigmented] Hair Growth on Extremity: [Left:No] [Right:No] Temperature of Extremity: [Left:Cool] [Right:Cool] Capillary Refill: [Left:> 3 seconds] Dependent Rubor: [Left:Yes No] [Right:Yes No] Toe Nail Assessment Left: Right: Thick: Yes Yes Discolored: Yes Yes Deformed: Yes Yes Improper Length and Hygiene: Yes Yes Electronic Signature(s) Unsigned Entered By: Brenton Grills on 01/04/2023 11:57:56 -------------------------------------------------------------------------------- Multi Wound Chart Details Patient Name: Date of Service: [name] 01/04/2023 11:30 A M Medical Record Number: [id number] Patient Account Number: [id number] Date of Birth/Sex: Treating RN: 12-01-1940 (82 y.o. F) Primary Care Emaya Preston: [name], [facility] Other Clinician: Referring Yuna Pizzolato: Treating Alailah Safley/Extender: [name], [facility] Weeks in Treatment: 22 Vital Signs Height(in): 67 Pulse(bpm): 89 Weight(lbs): 153 Blood Pressure(mmHg): 104/61 Body Mass Index(BMI): 24 Temperature(F): 97.6 Respiratory Rate(breaths/min):  18 [10:Photos:] Left T Second oe Left, Dorsal Foot Left, Lateral Foot Wound Location: Gradually Appeared Gradually Appeared Pressure Injury Wounding Event: Lymphedema Lymphedema Pressure Ulcer Primary Etiology: Anemia, Angina, Hypertension, Anemia, Angina, Hypertension, Anemia, Angina, Hypertension, Comorbid History: Vasculitis, Osteoarthritis Vasculitis, Osteoarthritis Vasculitis, Osteoarthritis 09/05/2022 09/05/2022 10/24/2022 Date Acquired: 4 17 10  Weeks of Treatment: Open Open Open Wound Status: No No No Wound Recurrence: 0.5x0.5x0.1 2.5x1.8x0.1 0.8x0.8x0.2 Measurements L x W x D (cm) 0.196 3.534 0.503 A (cm) : rea 0.02 0.353 0.101 Volume (cm) : 0.00% 90.90% -156.60% % Reduction in Area: 0.00% 90.90% -159.00% % Reduction in Volume: Full Thickness With Exposed Support 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 Category/Stage IV Classification: Structures Support Structures Medium Medium Medium Exudate Amount: Serosanguineous Purulent Purulent Exudate Type: red, brown yellow, brown, green yellow, brown, green Exudate Color: [name], [name] [id number] Page 5 of 18 [name] Indistinct, nonvisible Distinct, outline attached Wound Margin: [name] Medium (34-66%) Small (1-33%) Granulation A mount: [name] Red, Pink Red Granulation Quality: [name] Medium (34-66%) Large (67-100%) Necrotic A mount: [name] Medium (34-66%) Small (1-33%) Epithelialization: Debridement - Selective/Open Wound Debridement - Selective/Open Wound Debridement - Selective/Open Wound Debridement: 12:10 12:10 12:10 Pre-procedure Verification/Time Out Taken: Lidocaine 4% Topical Solution Lidocaine 4% Topical Solution Lidocaine 4% Topical Solution Pain Control: Necrotic/Eschar, Ambulance person, Ambulance person, [employer] Tissue Debrided: Non-Viable Tissue Non-Viable Tissue Non-Viable Tissue  y.o. [name] Primary Care Kamaljit Hizer: [name], [facility] Other Clinician: Referring Kemet Nijjar: Treating Majesty Oehlert/Extender: [name] SHO [name], [facility] Weeks in Treatment: 29 Wound  Status Wound Number: 9 Primary Etiology: Auto-immune Wound Location: Left, Lateral T Great oe Wound Status: Open Wounding Event: Gradually Appeared Comorbid History: Anemia, Angina, Hypertension, Vasculitis, Osteoarthritis Date Acquired: 11/01/2022 Weeks Of Treatment: 9 Clustered Wound: No Photos Wound Measurements Length: (cm) 0.1 Width: (cm) 0.1 Depth: (cm) 0.1 Area: (cm) 0.008 Volume: (cm) 0.001 % Reduction in Area: 98.8% % Reduction in Volume: 98.6% Epithelialization: Large (67-100%) Tunneling: No Undermining: No Wound Description Classification: Full Thickness With Exposed Suppor Wound Margin: Flat and Intact Exudate Amount: Small Exudate Type: Serosanguineous Exudate Color: red, brown t Structures Foul Odor After Cleansing: No Slough/Fibrino Yes Wound Bed Granulation Amount: Small (1-33%) Exposed Structure Granulation Quality: Pink Fascia Exposed: No Necrotic Amount: Large (67-100%) Fat Layer (Subcutaneous Tissue) Exposed: Yes Tendon Exposed: No Muscle Exposed: No Joint Exposed: No Bone Exposed: Yes Periwound Skin Texture Texture Color No Abnormalities Noted: Yes No Abnormalities Noted: No Rubor: Yes Moisture No Abnormalities Noted: Yes Temperature / Pain [name], [name] ([id number]) [id number] Page 17 of 18 Temperature: Cool/Cold Tenderness on Palpation: Yes Treatment Notes Wound #9 (Toe Great) Wound Laterality: Left, Lateral Cleanser Soap and Water Discharge Instruction: May shower and wash wound with dial antibacterial soap and water prior to dressing change. Wound Cleanser Discharge Instruction: Cleanse the wound with wound cleanser prior to applying a clean dressing using gauze sponges, not tissue or cotton balls. Peri-Wound Care Sween Lotion (Moisturizing lotion) Discharge Instruction: Apply moisturizing lotion as directed Topical Gentamicin Discharge Instruction: As directed by physician Mupirocin  Ointment Discharge Instruction: Apply Mupirocin (Bactroban) as instructed Ketoconazole Cream 2% Discharge Instruction: Apply Ketoconazole as directed Triamcinolone Discharge Instruction: Apply Triamcinolone as directed zinc Primary Dressing Endoform 2x2 in Discharge Instruction: Moisten with saline Secondary Dressing ABD Pad, 8x10 Discharge Instruction: Apply over primary dressing as directed. Drawtex 4x4 in Discharge Instruction: Apply over primary dressing as directed. Woven Gauze Sponge, Non-Sterile 4x4 in Discharge Instruction: can use gauze between toes (if no desired) Zetuvit Plus 4x8 in Discharge Instruction: Apply over primary dressing as directed. Secured With Compression Wrap Urgo K2 Lite, (equivalent to a 3 layer) two layer compression system, regular Discharge Instruction: Apply Urgo K2 Lite as directed (alternative to 3 layer compression). Compression Stockings Add-Ons Electronic Signature(s) Unsigned Entered By: Brenton Grills on 01/04/2023 12:05:38 -------------------------------------------------------------------------------- Vitals Details Patient Name: Date of Service: [name] 01/04/2023 11:30 A M Medical Record Number: [id number] Patient Account Number: [id number] Date of Birth/Sex: Treating RN: [birth year] (82 y.o. F) Primary Care Seba Madole: [name], [facility] Other Clinician: Referring Isael Stille: Treating Jadae Steinke/Extender: [name], [facility] [place], [place] ([id number]) [id number] Page 18 of 18 Weeks in Treatment: 29 Vital Signs Time Taken: 11:30 Temperature (F): 97.6 Height (in): 67 Pulse (bpm): 89 Weight (lbs): 153 Respiratory Rate (breaths/min): 18 Body Mass Index (BMI): 24 Blood Pressure (mmHg): 104/61 Reference Range: 80 - 120 mg / dl Electronic Signature(s) Signed: 01/04/2023 11:52:18 AM By: [name] Entered By: [name] on 01/04/2023 11:32:24  Ag+ Alginate Dressing, 4x4.75 (in/in) Discharge Instruction: Apply to wound bed as instructed [name], [name] ([id number]) [id number] Page 14 of 18 Secondary Dressing ABD Pad, 8x10 Discharge Instruction: Apply over primary dressing as directed. Drawtex 4x4 in Discharge Instruction: Apply over primary dressing as directed. Woven Gauze Sponge, Non-Sterile 4x4 in Discharge Instruction: can use gauze between toes (if no desired) Zetuvit Plus 4x8 in Discharge Instruction: Apply over primary dressing as directed. Secured With Compression Wrap Urgo K2 Lite, (equivalent to a 3 layer) two layer compression system, regular Discharge Instruction: Apply Urgo K2 Lite as directed (alternative to 3 layer compression). Compression Stockings Add-Ons Electronic Signature(s) Unsigned Entered By: Brenton Grills on 01/04/2023 12:04:23 -------------------------------------------------------------------------------- Wound Assessment Details Patient Name:  Date of Service: [name] 01/04/2023 11:30 A M Medical Record Number: [id number] Patient Account Number: [id number] Date of Birth/Sex: Treating RN: Sep 24, 1940 (82 y.o. [name] Primary Care Quinteria Chisum: [name], [facility] Other Clinician: Referring D'Arcy Abraha: Treating Satomi Buda/Extender: [name] SHO [name], [facility] Weeks in Treatment: 29 Wound Status Wound Number: 8 Primary Etiology: Pressure Ulcer Wound Location: Left, Lateral Foot Wound Status: Open Wounding Event: Pressure Injury Comorbid History: Anemia, Angina, Hypertension, Vasculitis, Osteoarthritis Date Acquired: 10/24/2022 Weeks Of Treatment: 10 Clustered Wound: No Photos Wound Measurements Length: (cm) 0.8 Width: (cm) 0.8 Depth: (cm) 0.2 Area: (cm) 0.503 Volume: (cm) 0.101 [name], [name] ([id number]) Wound Description Classification: Category/Stage IV Wound Margin: Distinct, outline attached Exudate Amount: Medium Exudate Type: Purulent Exudate Color: yellow, brown, green Foul Odor After Cleansing: No Slough/Fibrino Yes % Reduction in Area: -156.6% % Reduction in Volume: -159% Epithelialization: Small (1-33%) Tunneling: No Undermining: No [id number] Page 15 of 18 Wound Bed Granulation Amount: Small (1-33%) Exposed Structure Granulation Quality: Red Fascia Exposed: No Necrotic Amount: Large (67-100%) Fat Layer (Subcutaneous Tissue) Exposed: Yes Necrotic Quality: Adherent Slough Tendon Exposed: Yes Muscle Exposed: No Joint Exposed: No Bone Exposed: No Periwound Skin Texture Texture Color No Abnormalities Noted: Yes No Abnormalities Noted: Yes Moisture Temperature / Pain No Abnormalities Noted: Yes Temperature: No Abnormality Tenderness on Palpation: Yes Treatment Notes Wound #8 (Foot) Wound Laterality: Left, Lateral Cleanser Soap and Water Discharge Instruction: May shower and wash wound with dial antibacterial soap and water prior to dressing  change. Wound Cleanser Discharge Instruction: Cleanse the wound with wound cleanser prior to applying a clean dressing using gauze sponges, not tissue or cotton balls. Peri-Wound Care Sween Lotion (Moisturizing lotion) Discharge Instruction: Apply moisturizing lotion as directed Topical Gentamicin Discharge Instruction: As directed by physician Mupirocin Ointment Discharge Instruction: Apply Mupirocin (Bactroban) as instructed Ketoconazole Cream 2% Discharge Instruction: Apply Ketoconazole as directed Triamcinolone Discharge Instruction: Apply Triamcinolone as directed zinc Primary Dressing Endoform 2x2 in Discharge Instruction: Moisten with saline Secondary Dressing ABD Pad, 8x10 Discharge Instruction: Apply over primary dressing as directed. Drawtex 4x4 in Discharge Instruction: Apply over primary dressing as directed. Woven Gauze Sponge, Non-Sterile 4x4 in Discharge Instruction: can use gauze between toes (if no desired) Zetuvit Plus 4x8 in Discharge Instruction: Apply over primary dressing as directed. Secured With Compression Wrap Urgo K2 Lite, (equivalent to a 3 layer) two layer compression system, regular Discharge Instruction: Apply Urgo K2 Lite as directed (alternative to 3 layer compression). Compression Stockings Add-Ons [name], [name] ([id number]) [id number] Page 16 of 18 Electronic Signature(s) Unsigned Entered By: Brenton Grills on 01/04/2023 12:05:04 -------------------------------------------------------------------------------- Wound Assessment Details Patient Name: Date of Service: [name] 01/04/2023 11:30 A M Medical Record Number: [id number] Patient Account Number: [id number] Date of Birth/Sex: Treating RN: 1940-06-07 [phone]  to a 3 layer) two layer compression system, regular Discharge Instruction: Apply Urgo K2 Lite as directed (alternative to 3 layer compression). Compression Stockings Add-Ons Electronic Signature(s) Signed: 01/04/2023 12:22:28 PM By: [name] MD FACS Entered By: [name] on 01/04/2023 12:22:28 -------------------------------------------------------------------------------- Multi-Disciplinary Care Plan Details Patient Name: Date of Service: [name], [facility] [name] 01/04/2023 11:30 A M Medical Record Number: [id number] Patient Account Number: [id number] [name], [name] ([id number]) [phone].pdf Page 9 of 18 Date of Birth/Sex: Treating RN: 16-May-1940 (82 y.o. [name] Primary Care Magaly Pollina: [name], [facility] Other Clinician: Referring Jacquees Gongora: Treating Zyrus Hetland/Extender: [name], [facility] Weeks in Treatment:  27 Multidisciplinary Care Plan reviewed with physician Active Inactive Necrotic Tissue Nursing Diagnoses: Impaired tissue integrity related to necrotic/devitalized tissue Knowledge deficit related to management of necrotic/devitalized tissue Goals: Necrotic/devitalized tissue will be minimized in the wound bed Date Initiated: 06/12/2022 Target Resolution Date: 12/21/2022 Goal Status: Active Patient/caregiver will verbalize understanding of reason and process for debridement of necrotic tissue Date Initiated: 06/12/2022 Target Resolution Date: 12/21/2022 Goal Status: Active Interventions: Assess patient pain level pre-, during and post procedure and prior to discharge Provide education on necrotic tissue and debridement process Treatment Activities: Apply topical anesthetic as ordered : 06/12/2022 Notes: Wound/Skin Impairment Nursing Diagnoses: Impaired tissue integrity Knowledge deficit related to ulceration/compromised skin integrity Goals: Patient/caregiver will verbalize understanding of skin care regimen Date Initiated: 06/12/2022 Target Resolution Date: 12/21/2022 Goal Status: Active Interventions: Assess ulceration(s) every visit Treatment Activities: Skin care regimen initiated : 06/12/2022 Topical wound management initiated : 06/12/2022 Notes: Electronic Signature(s) Unsigned Entered By: Brenton Grills on 01/04/2023 12:12:19 -------------------------------------------------------------------------------- Pain Assessment Details Patient Name: Date of Service: [name] 01/04/2023 11:30 A M Medical Record Number: [id number] Patient Account Number: [id number] Date of Birth/Sex: Treating RN: Jul 21, 1940 (82 y.o. F) Primary Care Isayah Ignasiak: [name], [facility] Other Clinician: Referring Madinah Quarry: Treating Anysa Tacey/Extender: [name], [facility] Weeks in Treatment: [address] [place] ([id number]) [id number] Page 10 of 18 Active  Problems Location of Pain Severity and Description of Pain Patient Has Paino No Site Locations Pain Management and Medication Current Pain Management: Electronic Signature(s) Signed: 01/04/2023 11:52:18 AM By: [name] Entered By: [name] on 01/04/2023 11:32:46 -------------------------------------------------------------------------------- Patient/Caregiver Education Details Patient Name: Date of Service: [name], [facility] [name] 9/13/2024andnbsp11:30 A M Medical Record Number: [id number] Patient Account Number: [id number] Date of Birth/Gender: Treating RN: 26-May-1940 (82 y.o. [name] Primary Care Physician: [name], [facility] Other Clinician: Referring Physician: Treating Physician/Extender: [name], [facility] Weeks in Treatment: 24 Education Assessment Education Provided To: Patient and Caregiver Education Topics Provided Wound/Skin Impairment: Methods: Explain/Verbal Responses: State content correctly Electronic Signature(s) Unsigned Entered By: Brenton Grills on 01/04/2023 12:12:37 Signature(s): [name], [name] ([id number]) 130 Date(s): [id number] Page 11 of 18 -------------------------------------------------------------------------------- Wound Assessment Details Patient Name: Date of Service: [name] 01/04/2023 11:30 A M Medical Record Number: [id number] Patient Account Number: [id number] Date of Birth/Sex: Treating RN: [birth year] (82 y.o. [name] Primary Care Dajanae Brophy: [name], [facility] Other Clinician: Referring Dave Mergen: Treating Sienna Stonehocker/Extender: [name] SHO [name], [facility] Weeks in Treatment: 29 Wound Status Wound Number: 10 Primary Etiology: Lymphedema Wound Location: Left T Second oe Wound Status: Open Wounding Event: Gradually Appeared Comorbid History: Anemia, Angina, Hypertension, Vasculitis, Osteoarthritis Date Acquired: 09/05/2022 Weeks Of Treatment: 4 Clustered Wound:  No Photos Wound Measurements Length: (cm) 0.5 Width: (cm) 0.5 Depth: (cm) 0.1 Area: (cm) 0.196 Volume: (cm) 0.02 % Reduction in Area: 0% % Reduction in  on 01/04/2023 12:18:12 -------------------------------------------------------------------------------- Encounter Discharge Information Details Patient Name: Date of Service: [name] 01/04/2023 11:30 A M Medical Record Number: [id number] Patient Account Number: [id number] Date of Birth/Sex: Treating RN: January 03, 1941 (82 y.o. [name] Primary Care Juniper Cobey: [name], [facility] Other Clinician: Referring Savaughn Karwowski: Treating Layani Foronda/Extender: [name] SHO [name], [facility] Weeks in Treatment: 67 Encounter Discharge Information Items Post Procedure Vitals Discharge Condition: Stable Temperature (F): 98.2 Ambulatory Status: Wheelchair Pulse (bpm): 82 Discharge Destination: Home Respiratory Rate (breaths/min): 18 Transportation: Private Auto Blood Pressure (mmHg): 128/64 Accompanied By: caregiver Schedule Follow-up Appointment: Yes Clinical Summary of Care: Patient Declined Electronic Signature(s) Unsigned Entered By: Brenton Grills on 01/04/2023 12:20:23 -------------------------------------------------------------------------------- Lower  Extremity Assessment Details Patient Name: Date of Service: [name] 01/04/2023 11:30 A M Medical Record Number: [id number] Patient Account Number: [id number] Date of Birth/Sex: Treating RN: [birth year] (82 y.o. [name] Primary Care Avrom Robarts: [name], [facility] Other Clinician: Referring Damare Serano: Treating Shirlette Scarber/Extender: [name] SHO [name], [facility] Weeks in Treatment: 29 Edema Assessment Assessed: [Left: No] [Right: No] Edema: [Left: Ye] [Right: s] Calf Left: Right: Point of Measurement: From Medial Instep 27 cm 27.5 cm Ankle Left: Right: Point of Measurement: From Medial Instep 19.5 cm 19 cm Vascular Assessment Pulses: Dorsalis Pedis [name], [name] ([id number]) [Right:[id number] Page 4 of 18] Palpable: [Left:Yes] [Right:Yes] Extremity colors, hair growth, and conditions: Extremity Color: [Left:Hyperpigmented] Hair Growth on Extremity: [Left:No] [Right:No] Temperature of Extremity: [Left:Cool] [Right:Cool] Capillary Refill: [Left:> 3 seconds] Dependent Rubor: [Left:Yes No] [Right:Yes No] Toe Nail Assessment Left: Right: Thick: Yes Yes Discolored: Yes Yes Deformed: Yes Yes Improper Length and Hygiene: Yes Yes Electronic Signature(s) Unsigned Entered By: Brenton Grills on 01/04/2023 11:57:56 -------------------------------------------------------------------------------- Multi Wound Chart Details Patient Name: Date of Service: [name] 01/04/2023 11:30 A M Medical Record Number: [id number] Patient Account Number: [id number] Date of Birth/Sex: Treating RN: 12-01-1940 (82 y.o. F) Primary Care Emaya Preston: [name], [facility] Other Clinician: Referring Yuna Pizzolato: Treating Alailah Safley/Extender: [name], [facility] Weeks in Treatment: 22 Vital Signs Height(in): 67 Pulse(bpm): 89 Weight(lbs): 153 Blood Pressure(mmHg): 104/61 Body Mass Index(BMI): 24 Temperature(F): 97.6 Respiratory Rate(breaths/min):  18 [10:Photos:] Left T Second oe Left, Dorsal Foot Left, Lateral Foot Wound Location: Gradually Appeared Gradually Appeared Pressure Injury Wounding Event: Lymphedema Lymphedema Pressure Ulcer Primary Etiology: Anemia, Angina, Hypertension, Anemia, Angina, Hypertension, Anemia, Angina, Hypertension, Comorbid History: Vasculitis, Osteoarthritis Vasculitis, Osteoarthritis Vasculitis, Osteoarthritis 09/05/2022 09/05/2022 10/24/2022 Date Acquired: 4 17 10  Weeks of Treatment: Open Open Open Wound Status: No No No Wound Recurrence: 0.5x0.5x0.1 2.5x1.8x0.1 0.8x0.8x0.2 Measurements L x W x D (cm) 0.196 3.534 0.503 A (cm) : rea 0.02 0.353 0.101 Volume (cm) : 0.00% 90.90% -156.60% % Reduction in Area: 0.00% 90.90% -159.00% % Reduction in Volume: Full Thickness With Exposed Support 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 Category/Stage IV Classification: Structures Support Structures Medium Medium Medium Exudate Amount: Serosanguineous Purulent Purulent Exudate Type: red, brown yellow, brown, green yellow, brown, green Exudate Color: [name], [name] [id number] Page 5 of 18 [name] Indistinct, nonvisible Distinct, outline attached Wound Margin: [name] Medium (34-66%) Small (1-33%) Granulation A mount: [name] Red, Pink Red Granulation Quality: [name] Medium (34-66%) Large (67-100%) Necrotic A mount: [name] Medium (34-66%) Small (1-33%) Epithelialization: Debridement - Selective/Open Wound Debridement - Selective/Open Wound Debridement - Selective/Open Wound Debridement: 12:10 12:10 12:10 Pre-procedure Verification/Time Out Taken: Lidocaine 4% Topical Solution Lidocaine 4% Topical Solution Lidocaine 4% Topical Solution Pain Control: Necrotic/Eschar, Ambulance person, Ambulance person, [employer] Tissue Debrided: Non-Viable Tissue Non-Viable Tissue Non-Viable Tissue  Ag+ Alginate Dressing, 4x4.75 (in/in) Discharge Instruction: Apply to wound bed as instructed [name], [name] ([id number]) [id number] Page 14 of 18 Secondary Dressing ABD Pad, 8x10 Discharge Instruction: Apply over primary dressing as directed. Drawtex 4x4 in Discharge Instruction: Apply over primary dressing as directed. Woven Gauze Sponge, Non-Sterile 4x4 in Discharge Instruction: can use gauze between toes (if no desired) Zetuvit Plus 4x8 in Discharge Instruction: Apply over primary dressing as directed. Secured With Compression Wrap Urgo K2 Lite, (equivalent to a 3 layer) two layer compression system, regular Discharge Instruction: Apply Urgo K2 Lite as directed (alternative to 3 layer compression). Compression Stockings Add-Ons Electronic Signature(s) Unsigned Entered By: Brenton Grills on 01/04/2023 12:04:23 -------------------------------------------------------------------------------- Wound Assessment Details Patient Name:  Date of Service: [name] 01/04/2023 11:30 A M Medical Record Number: [id number] Patient Account Number: [id number] Date of Birth/Sex: Treating RN: Sep 24, 1940 (82 y.o. [name] Primary Care Quinteria Chisum: [name], [facility] Other Clinician: Referring D'Arcy Abraha: Treating Satomi Buda/Extender: [name] SHO [name], [facility] Weeks in Treatment: 29 Wound Status Wound Number: 8 Primary Etiology: Pressure Ulcer Wound Location: Left, Lateral Foot Wound Status: Open Wounding Event: Pressure Injury Comorbid History: Anemia, Angina, Hypertension, Vasculitis, Osteoarthritis Date Acquired: 10/24/2022 Weeks Of Treatment: 10 Clustered Wound: No Photos Wound Measurements Length: (cm) 0.8 Width: (cm) 0.8 Depth: (cm) 0.2 Area: (cm) 0.503 Volume: (cm) 0.101 [name], [name] ([id number]) Wound Description Classification: Category/Stage IV Wound Margin: Distinct, outline attached Exudate Amount: Medium Exudate Type: Purulent Exudate Color: yellow, brown, green Foul Odor After Cleansing: No Slough/Fibrino Yes % Reduction in Area: -156.6% % Reduction in Volume: -159% Epithelialization: Small (1-33%) Tunneling: No Undermining: No [id number] Page 15 of 18 Wound Bed Granulation Amount: Small (1-33%) Exposed Structure Granulation Quality: Red Fascia Exposed: No Necrotic Amount: Large (67-100%) Fat Layer (Subcutaneous Tissue) Exposed: Yes Necrotic Quality: Adherent Slough Tendon Exposed: Yes Muscle Exposed: No Joint Exposed: No Bone Exposed: No Periwound Skin Texture Texture Color No Abnormalities Noted: Yes No Abnormalities Noted: Yes Moisture Temperature / Pain No Abnormalities Noted: Yes Temperature: No Abnormality Tenderness on Palpation: Yes Treatment Notes Wound #8 (Foot) Wound Laterality: Left, Lateral Cleanser Soap and Water Discharge Instruction: May shower and wash wound with dial antibacterial soap and water prior to dressing  change. Wound Cleanser Discharge Instruction: Cleanse the wound with wound cleanser prior to applying a clean dressing using gauze sponges, not tissue or cotton balls. Peri-Wound Care Sween Lotion (Moisturizing lotion) Discharge Instruction: Apply moisturizing lotion as directed Topical Gentamicin Discharge Instruction: As directed by physician Mupirocin Ointment Discharge Instruction: Apply Mupirocin (Bactroban) as instructed Ketoconazole Cream 2% Discharge Instruction: Apply Ketoconazole as directed Triamcinolone Discharge Instruction: Apply Triamcinolone as directed zinc Primary Dressing Endoform 2x2 in Discharge Instruction: Moisten with saline Secondary Dressing ABD Pad, 8x10 Discharge Instruction: Apply over primary dressing as directed. Drawtex 4x4 in Discharge Instruction: Apply over primary dressing as directed. Woven Gauze Sponge, Non-Sterile 4x4 in Discharge Instruction: can use gauze between toes (if no desired) Zetuvit Plus 4x8 in Discharge Instruction: Apply over primary dressing as directed. Secured With Compression Wrap Urgo K2 Lite, (equivalent to a 3 layer) two layer compression system, regular Discharge Instruction: Apply Urgo K2 Lite as directed (alternative to 3 layer compression). Compression Stockings Add-Ons [name], [name] ([id number]) [id number] Page 16 of 18 Electronic Signature(s) Unsigned Entered By: Brenton Grills on 01/04/2023 12:05:04 -------------------------------------------------------------------------------- Wound Assessment Details Patient Name: Date of Service: [name] 01/04/2023 11:30 A M Medical Record Number: [id number] Patient Account Number: [id number] Date of Birth/Sex: Treating RN: 1940-06-07 [phone]  Ag+ Alginate Dressing, 4x4.75 (in/in) Discharge Instruction: Apply to wound bed as instructed [name], [name] ([id number]) [id number] Page 14 of 18 Secondary Dressing ABD Pad, 8x10 Discharge Instruction: Apply over primary dressing as directed. Drawtex 4x4 in Discharge Instruction: Apply over primary dressing as directed. Woven Gauze Sponge, Non-Sterile 4x4 in Discharge Instruction: can use gauze between toes (if no desired) Zetuvit Plus 4x8 in Discharge Instruction: Apply over primary dressing as directed. Secured With Compression Wrap Urgo K2 Lite, (equivalent to a 3 layer) two layer compression system, regular Discharge Instruction: Apply Urgo K2 Lite as directed (alternative to 3 layer compression). Compression Stockings Add-Ons Electronic Signature(s) Unsigned Entered By: Brenton Grills on 01/04/2023 12:04:23 -------------------------------------------------------------------------------- Wound Assessment Details Patient Name:  Date of Service: [name] 01/04/2023 11:30 A M Medical Record Number: [id number] Patient Account Number: [id number] Date of Birth/Sex: Treating RN: Sep 24, 1940 (82 y.o. [name] Primary Care Quinteria Chisum: [name], [facility] Other Clinician: Referring D'Arcy Abraha: Treating Satomi Buda/Extender: [name] SHO [name], [facility] Weeks in Treatment: 29 Wound Status Wound Number: 8 Primary Etiology: Pressure Ulcer Wound Location: Left, Lateral Foot Wound Status: Open Wounding Event: Pressure Injury Comorbid History: Anemia, Angina, Hypertension, Vasculitis, Osteoarthritis Date Acquired: 10/24/2022 Weeks Of Treatment: 10 Clustered Wound: No Photos Wound Measurements Length: (cm) 0.8 Width: (cm) 0.8 Depth: (cm) 0.2 Area: (cm) 0.503 Volume: (cm) 0.101 [name], [name] ([id number]) Wound Description Classification: Category/Stage IV Wound Margin: Distinct, outline attached Exudate Amount: Medium Exudate Type: Purulent Exudate Color: yellow, brown, green Foul Odor After Cleansing: No Slough/Fibrino Yes % Reduction in Area: -156.6% % Reduction in Volume: -159% Epithelialization: Small (1-33%) Tunneling: No Undermining: No [id number] Page 15 of 18 Wound Bed Granulation Amount: Small (1-33%) Exposed Structure Granulation Quality: Red Fascia Exposed: No Necrotic Amount: Large (67-100%) Fat Layer (Subcutaneous Tissue) Exposed: Yes Necrotic Quality: Adherent Slough Tendon Exposed: Yes Muscle Exposed: No Joint Exposed: No Bone Exposed: No Periwound Skin Texture Texture Color No Abnormalities Noted: Yes No Abnormalities Noted: Yes Moisture Temperature / Pain No Abnormalities Noted: Yes Temperature: No Abnormality Tenderness on Palpation: Yes Treatment Notes Wound #8 (Foot) Wound Laterality: Left, Lateral Cleanser Soap and Water Discharge Instruction: May shower and wash wound with dial antibacterial soap and water prior to dressing  change. Wound Cleanser Discharge Instruction: Cleanse the wound with wound cleanser prior to applying a clean dressing using gauze sponges, not tissue or cotton balls. Peri-Wound Care Sween Lotion (Moisturizing lotion) Discharge Instruction: Apply moisturizing lotion as directed Topical Gentamicin Discharge Instruction: As directed by physician Mupirocin Ointment Discharge Instruction: Apply Mupirocin (Bactroban) as instructed Ketoconazole Cream 2% Discharge Instruction: Apply Ketoconazole as directed Triamcinolone Discharge Instruction: Apply Triamcinolone as directed zinc Primary Dressing Endoform 2x2 in Discharge Instruction: Moisten with saline Secondary Dressing ABD Pad, 8x10 Discharge Instruction: Apply over primary dressing as directed. Drawtex 4x4 in Discharge Instruction: Apply over primary dressing as directed. Woven Gauze Sponge, Non-Sterile 4x4 in Discharge Instruction: can use gauze between toes (if no desired) Zetuvit Plus 4x8 in Discharge Instruction: Apply over primary dressing as directed. Secured With Compression Wrap Urgo K2 Lite, (equivalent to a 3 layer) two layer compression system, regular Discharge Instruction: Apply Urgo K2 Lite as directed (alternative to 3 layer compression). Compression Stockings Add-Ons [name], [name] ([id number]) [id number] Page 16 of 18 Electronic Signature(s) Unsigned Entered By: Brenton Grills on 01/04/2023 12:05:04 -------------------------------------------------------------------------------- Wound Assessment Details Patient Name: Date of Service: [name] 01/04/2023 11:30 A M Medical Record Number: [id number] Patient Account Number: [id number] Date of Birth/Sex: Treating RN: 1940-06-07 [phone]  to a 3 layer) two layer compression system, regular Discharge Instruction: Apply Urgo K2 Lite as directed (alternative to 3 layer compression). Compression Stockings Add-Ons Electronic Signature(s) Signed: 01/04/2023 12:22:28 PM By: [name] MD FACS Entered By: [name] on 01/04/2023 12:22:28 -------------------------------------------------------------------------------- Multi-Disciplinary Care Plan Details Patient Name: Date of Service: [name], [facility] [name] 01/04/2023 11:30 A M Medical Record Number: [id number] Patient Account Number: [id number] [name], [name] ([id number]) [phone].pdf Page 9 of 18 Date of Birth/Sex: Treating RN: 16-May-1940 (82 y.o. [name] Primary Care Magaly Pollina: [name], [facility] Other Clinician: Referring Jacquees Gongora: Treating Zyrus Hetland/Extender: [name], [facility] Weeks in Treatment:  27 Multidisciplinary Care Plan reviewed with physician Active Inactive Necrotic Tissue Nursing Diagnoses: Impaired tissue integrity related to necrotic/devitalized tissue Knowledge deficit related to management of necrotic/devitalized tissue Goals: Necrotic/devitalized tissue will be minimized in the wound bed Date Initiated: 06/12/2022 Target Resolution Date: 12/21/2022 Goal Status: Active Patient/caregiver will verbalize understanding of reason and process for debridement of necrotic tissue Date Initiated: 06/12/2022 Target Resolution Date: 12/21/2022 Goal Status: Active Interventions: Assess patient pain level pre-, during and post procedure and prior to discharge Provide education on necrotic tissue and debridement process Treatment Activities: Apply topical anesthetic as ordered : 06/12/2022 Notes: Wound/Skin Impairment Nursing Diagnoses: Impaired tissue integrity Knowledge deficit related to ulceration/compromised skin integrity Goals: Patient/caregiver will verbalize understanding of skin care regimen Date Initiated: 06/12/2022 Target Resolution Date: 12/21/2022 Goal Status: Active Interventions: Assess ulceration(s) every visit Treatment Activities: Skin care regimen initiated : 06/12/2022 Topical wound management initiated : 06/12/2022 Notes: Electronic Signature(s) Unsigned Entered By: Brenton Grills on 01/04/2023 12:12:19 -------------------------------------------------------------------------------- Pain Assessment Details Patient Name: Date of Service: [name] 01/04/2023 11:30 A M Medical Record Number: [id number] Patient Account Number: [id number] Date of Birth/Sex: Treating RN: Jul 21, 1940 (82 y.o. F) Primary Care Isayah Ignasiak: [name], [facility] Other Clinician: Referring Madinah Quarry: Treating Anysa Tacey/Extender: [name], [facility] Weeks in Treatment: [address] [place] ([id number]) [id number] Page 10 of 18 Active  Problems Location of Pain Severity and Description of Pain Patient Has Paino No Site Locations Pain Management and Medication Current Pain Management: Electronic Signature(s) Signed: 01/04/2023 11:52:18 AM By: [name] Entered By: [name] on 01/04/2023 11:32:46 -------------------------------------------------------------------------------- Patient/Caregiver Education Details Patient Name: Date of Service: [name], [facility] [name] 9/13/2024andnbsp11:30 A M Medical Record Number: [id number] Patient Account Number: [id number] Date of Birth/Gender: Treating RN: 26-May-1940 (82 y.o. [name] Primary Care Physician: [name], [facility] Other Clinician: Referring Physician: Treating Physician/Extender: [name], [facility] Weeks in Treatment: 24 Education Assessment Education Provided To: Patient and Caregiver Education Topics Provided Wound/Skin Impairment: Methods: Explain/Verbal Responses: State content correctly Electronic Signature(s) Unsigned Entered By: Brenton Grills on 01/04/2023 12:12:37 Signature(s): [name], [name] ([id number]) 130 Date(s): [id number] Page 11 of 18 -------------------------------------------------------------------------------- Wound Assessment Details Patient Name: Date of Service: [name] 01/04/2023 11:30 A M Medical Record Number: [id number] Patient Account Number: [id number] Date of Birth/Sex: Treating RN: [birth year] (82 y.o. [name] Primary Care Dajanae Brophy: [name], [facility] Other Clinician: Referring Dave Mergen: Treating Sienna Stonehocker/Extender: [name] SHO [name], [facility] Weeks in Treatment: 29 Wound Status Wound Number: 10 Primary Etiology: Lymphedema Wound Location: Left T Second oe Wound Status: Open Wounding Event: Gradually Appeared Comorbid History: Anemia, Angina, Hypertension, Vasculitis, Osteoarthritis Date Acquired: 09/05/2022 Weeks Of Treatment: 4 Clustered Wound:  No Photos Wound Measurements Length: (cm) 0.5 Width: (cm) 0.5 Depth: (cm) 0.1 Area: (cm) 0.196 Volume: (cm) 0.02 % Reduction in Area: 0% % Reduction in

## 2023-01-07 NOTE — Progress Notes (Signed)
Apply over primary dressing as directed. Secondary Dressing: Drawtex 4x4 in 1 x Per Week/30 Days Discharge Instructions: Apply over primary dressing as directed. Secondary Dressing: Woven Gauze Sponge, Non-Sterile 4x4 in 1 x Per Week/30 Days Discharge Instructions: can use gauze between toes (if no desired) Secondary Dressing: Zetuvit Plus 4x8 in 1 x Per Week/30 Days Discharge Instructions: Apply over primary dressing as directed. Com pression Wrap: Urgo K2 Lite, (equivalent to a 3 layer) two layer compression system, regular 1 x Per Week/30 Days Discharge Instructions: Apply Urgo K2 Lite as directed (alternative to 3 layer compression). WOUND #9: - T Great Wound Laterality: Left, Lateral oe Cleanser: Soap and Water 1 x Per Week/30 Days Discharge Instructions: May shower and wash  wound with dial antibacterial soap and water prior to dressing change. Cleanser: Wound Cleanser 1 x Per Week/30 Days Discharge Instructions: Cleanse the wound with wound cleanser prior to applying a clean dressing using gauze sponges, not tissue or cotton balls. Peri-Wound Care: Sween Lotion (Moisturizing lotion) 1 x Per Week/30 Days Discharge Instructions: Apply moisturizing lotion as directed Topical: Gentamicin 1 x Per Week/30 Days Discharge Instructions: As directed by physician Topical: Mupirocin Ointment 1 x Per Week/30 Days Discharge Instructions: Apply Mupirocin (Bactroban) as instructed Topical: Ketoconazole Cream 2% 1 x Per Week/30 Days Discharge Instructions: Apply Ketoconazole as directed Topical: Triamcinolone 1 x Per Week/30 Days Discharge Instructions: Apply Triamcinolone as directed Topical: zinc 1 x Per Week/30 Days Prim Dressing: Endoform 2x2 in 1 x Per Week/30 Days ary Discharge Instructions: Moisten with saline Secondary Dressing: ABD Pad, 8x10 1 x Per Week/30 Days Discharge Instructions: Apply over primary dressing as directed. Secondary Dressing: Drawtex 4x4 in 1 x Per Week/30 Days Discharge Instructions: Apply over primary dressing as directed. Secondary Dressing: Woven Gauze Sponge, Non-Sterile 4x4 in 1 x Per Week/30 Days Discharge Instructions: can use gauze between toes (if no desired) Secondary Dressing: Zetuvit Plus 4x8 in 1 x Per Week/30 Days Discharge Instructions: Apply over primary dressing as directed. Com pression Wrap: Urgo K2 Lite, (equivalent to a 3 layer) two layer compression system, regular 1 x Per Week/30 Days Discharge Instructions: Apply Urgo K2 Lite as directed (alternative to 3 layer compression). 01/04/2023: There has been no significant change to any of her wounds, but fortunately there has been no deterioration. I used a curette to debride slough and eschar from all of the lip surfaces. There are continue the mixture of topical gentamicin and  mupirocin with ketoconazole and endoform with Urgo light compression wrap. Follow-up in 1 week. Electronic Signature(s) Signed: 01/04/2023 12:30:26 PM By: [name] MD FACS Entered By: [name] on 01/04/2023 09:30:25 -------------------------------------------------------------------------------- HxROS Details Patient Name: Date of Service: [place] [name], [facility] RO N 01/04/2023 11:30 A M Medical Record Number: [id number] Patient Account Number: [id number] Date of Birth/Sex: Treating RN: [birth year] (82 y.o. 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 Electronic Signature(s) Signed: 01/04/2023 12:31:09 PM By: [name] MD FACS Entered By: [name] on 01/04/2023 09:29:16 -------------------------------------------------------------------------------- Problem List Details Patient Name: Date of Service: [name] [facility] RO N 01/04/2023 11:30 A M Medical Record Number: [id number] Patient Account Number: [id number] Date of Birth/Sex: Treating RN: 1940-05-24 (82 y.o. 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 10/03/2022 No Yes without evidence of necrosis L97.525 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 11/01/2022 No Yes without evidence of necrosis L97.812 Non-pressure chronic ulcer of other part of right lower leg with fat layer 12/06/2022 No Yes exposed 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 06/12/2022 No Yes Saine, Fleeta ([id number]) [phone].pdf Page 10 of [phone] Essential (primary) hypertension 06/12/2022 No Yes 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 06/12/2022 No Yes 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 atrial fibrillation 06/12/2022 No Yes Z79.01 Long term (current) use of anticoagulants 06/12/2022 No Yes I89.0 Lymphedema, not elsewhere  classified 08/20/2022 No Yes Inactive Problems ICD-10 Code Description Active Date Inactive Date L97.521 Non-pressure chronic ulcer of other part of left foot limited to breakdown of skin 06/12/2022 06/12/2022 Resolved Problems Electronic Signature(s) Signed: 01/04/2023 12:22:11 PM By: [name] MD FACS Entered By: [name] on 01/04/2023 09:22:10 -------------------------------------------------------------------------------- Progress Note Details Patient Name: Date of Service: [place] [name] [facility] RO N 01/04/2023 11:30 A M Medical Record Number: [id number] Patient Account Number: [id number] Date of Birth/Sex: Treating RN: [birth year] (82 y.o. F) Primary Care Provider: [name], [facility] Other Clinician: Referring Provider: Treating Provider/Extender: [name], [facility] Weeks in Treatment: 53 Subjective Chief Complaint Information obtained from Patient Patient seen for complaints of Non-Healing Wound. History of Present Illness (HPI) ADMISSION 06/12/2022 This is an 82 year old woman with a history of CVA, crest syndrome, atrial fibrillation, rocker-bottom foot deformity. 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 She has been followed by podiatry for this and I am not entirely clear as to how she came to be referred here. She resides in an assisted living facility. It is not clear what they have been putting on her wound, but on intake, she was noted to have denuded skin on her medial third toe, as well as ulcers on her dorsal great toe and lateral great toe. There is slough accumulation on both of the toe ulcers. There was an odor noted at intake, but after her foot was washed, the odor dissipated. Her toes are folded on top of each other creating areas of abrasion and pockets for moisture collection, which seems to be the primary cause of her ulceration. 06/20/2022: The skin between her toes and on the ball of her foot is  completely macerated. There has been more tissue breakdown. The wound on her great toe has some slough accumulation. 06/27/2022: No change to her wounds today. There has been no further deterioration, but no significant improvement. She was both hypotensive and bradycardic on intake. SEMAYA, BENNINGER ([id number]) [id number] Page 11 of 17 07/11/2022: Today, her foot is completely macerated. She reports that the wound care nurse actually soaked her foot and then applied foam, despite our specific orders  Apply over primary dressing as directed. Secondary Dressing: Drawtex 4x4 in 1 x Per Week/30 Days Discharge Instructions: Apply over primary dressing as directed. Secondary Dressing: Woven Gauze Sponge, Non-Sterile 4x4 in 1 x Per Week/30 Days Discharge Instructions: can use gauze between toes (if no desired) Secondary Dressing: Zetuvit Plus 4x8 in 1 x Per Week/30 Days Discharge Instructions: Apply over primary dressing as directed. Com pression Wrap: Urgo K2 Lite, (equivalent to a 3 layer) two layer compression system, regular 1 x Per Week/30 Days Discharge Instructions: Apply Urgo K2 Lite as directed (alternative to 3 layer compression). WOUND #9: - T Great Wound Laterality: Left, Lateral oe Cleanser: Soap and Water 1 x Per Week/30 Days Discharge Instructions: May shower and wash  wound with dial antibacterial soap and water prior to dressing change. Cleanser: Wound Cleanser 1 x Per Week/30 Days Discharge Instructions: Cleanse the wound with wound cleanser prior to applying a clean dressing using gauze sponges, not tissue or cotton balls. Peri-Wound Care: Sween Lotion (Moisturizing lotion) 1 x Per Week/30 Days Discharge Instructions: Apply moisturizing lotion as directed Topical: Gentamicin 1 x Per Week/30 Days Discharge Instructions: As directed by physician Topical: Mupirocin Ointment 1 x Per Week/30 Days Discharge Instructions: Apply Mupirocin (Bactroban) as instructed Topical: Ketoconazole Cream 2% 1 x Per Week/30 Days Discharge Instructions: Apply Ketoconazole as directed Topical: Triamcinolone 1 x Per Week/30 Days Discharge Instructions: Apply Triamcinolone as directed Topical: zinc 1 x Per Week/30 Days Prim Dressing: Endoform 2x2 in 1 x Per Week/30 Days ary Discharge Instructions: Moisten with saline Secondary Dressing: ABD Pad, 8x10 1 x Per Week/30 Days Discharge Instructions: Apply over primary dressing as directed. Secondary Dressing: Drawtex 4x4 in 1 x Per Week/30 Days Discharge Instructions: Apply over primary dressing as directed. Secondary Dressing: Woven Gauze Sponge, Non-Sterile 4x4 in 1 x Per Week/30 Days Discharge Instructions: can use gauze between toes (if no desired) Secondary Dressing: Zetuvit Plus 4x8 in 1 x Per Week/30 Days Discharge Instructions: Apply over primary dressing as directed. Com pression Wrap: Urgo K2 Lite, (equivalent to a 3 layer) two layer compression system, regular 1 x Per Week/30 Days Discharge Instructions: Apply Urgo K2 Lite as directed (alternative to 3 layer compression). 01/04/2023: There has been no significant change to any of her wounds, but fortunately there has been no deterioration. I used a curette to debride slough and eschar from all of the lip surfaces. There are continue the mixture of topical gentamicin and  mupirocin with ketoconazole and endoform with Urgo light compression wrap. Follow-up in 1 week. Electronic Signature(s) Signed: 01/04/2023 12:30:26 PM By: [name] MD FACS Entered By: [name] on 01/04/2023 09:30:25 -------------------------------------------------------------------------------- HxROS Details Patient Name: Date of Service: [place] [name], [facility] RO N 01/04/2023 11:30 A M Medical Record Number: [id number] Patient Account Number: [id number] Date of Birth/Sex: Treating RN: [birth year] (82 y.o. 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 Secondary Dressing: Drawtex 4x4 in 1 x Per Week/30 Days Discharge Instructions: Apply over primary dressing as directed. Secondary Dressing: Woven Gauze Sponge, Non-Sterile 4x4 in 1 x Per Week/30 Days Discharge Instructions: can use gauze between toes (if no desired) Secondary Dressing: Zetuvit Plus 4x8 in 1 x Per Week/30 Days Discharge Instructions: Apply over primary dressing as directed. Com pression Wrap: Urgo K2 Lite, (equivalent to a 3 layer) two layer compression system, regular 1 x Per Week/30 Days Discharge Instructions: Apply Urgo K2 Lite as directed (alternative to 3 layer compression). WOUND #9: - T Great Wound Laterality: Left, Lateral oe Cleanser: Soap and Water 1 x Per Week/30 Days Discharge Instructions: May shower and wash  wound with dial antibacterial soap and water prior to dressing change. Cleanser: Wound Cleanser 1 x Per Week/30 Days Discharge Instructions: Cleanse the wound with wound cleanser prior to applying a clean dressing using gauze sponges, not tissue or cotton balls. Peri-Wound Care: Sween Lotion (Moisturizing lotion) 1 x Per Week/30 Days Discharge Instructions: Apply moisturizing lotion as directed Topical: Gentamicin 1 x Per Week/30 Days Discharge Instructions: As directed by physician Topical: Mupirocin Ointment 1 x Per Week/30 Days Discharge Instructions: Apply Mupirocin (Bactroban) as instructed Topical: Ketoconazole Cream 2% 1 x Per Week/30 Days Discharge Instructions: Apply Ketoconazole as directed Topical: Triamcinolone 1 x Per Week/30 Days Discharge Instructions: Apply Triamcinolone as directed Topical: zinc 1 x Per Week/30 Days Prim Dressing: Endoform 2x2 in 1 x Per Week/30 Days ary Discharge Instructions: Moisten with saline Secondary Dressing: ABD Pad, 8x10 1 x Per Week/30 Days Discharge Instructions: Apply over primary dressing as directed. Secondary Dressing: Drawtex 4x4 in 1 x Per Week/30 Days Discharge Instructions: Apply over primary dressing as directed. Secondary Dressing: Woven Gauze Sponge, Non-Sterile 4x4 in 1 x Per Week/30 Days Discharge Instructions: can use gauze between toes (if no desired) Secondary Dressing: Zetuvit Plus 4x8 in 1 x Per Week/30 Days Discharge Instructions: Apply over primary dressing as directed. Com pression Wrap: Urgo K2 Lite, (equivalent to a 3 layer) two layer compression system, regular 1 x Per Week/30 Days Discharge Instructions: Apply Urgo K2 Lite as directed (alternative to 3 layer compression). 01/04/2023: There has been no significant change to any of her wounds, but fortunately there has been no deterioration. I used a curette to debride slough and eschar from all of the lip surfaces. There are continue the mixture of topical gentamicin and  mupirocin with ketoconazole and endoform with Urgo light compression wrap. Follow-up in 1 week. Electronic Signature(s) Signed: 01/04/2023 12:30:26 PM By: [name] MD FACS Entered By: [name] on 01/04/2023 09:30:25 -------------------------------------------------------------------------------- HxROS Details Patient Name: Date of Service: [place] [name], [facility] RO N 01/04/2023 11:30 A M Medical Record Number: [id number] Patient Account Number: [id number] Date of Birth/Sex: Treating RN: [birth year] (82 y.o. 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 Electronic Signature(s) Signed: 01/04/2023 12:31:09 PM By: [name] MD FACS Entered By: [name] on 01/04/2023 09:29:16 -------------------------------------------------------------------------------- Problem List Details Patient Name: Date of Service: [name] [facility] RO N 01/04/2023 11:30 A M Medical Record Number: [id number] Patient Account Number: [id number] Date of Birth/Sex: Treating RN: 1940-05-24 (82 y.o. 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 10/03/2022 No Yes without evidence of necrosis L97.525 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 11/01/2022 No Yes without evidence of necrosis L97.812 Non-pressure chronic ulcer of other part of right lower leg with fat layer 12/06/2022 No Yes exposed 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 06/12/2022 No Yes Saine, Fleeta ([id number]) [id number].pdf Page 10 of [phone] Essential (primary) hypertension 06/12/2022 No Yes 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 06/12/2022 No Yes 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 atrial fibrillation 06/12/2022 No Yes Z79.01 Long term (current) use of anticoagulants 06/12/2022 No Yes I89.0 Lymphedema, not elsewhere  classified 08/20/2022 No Yes Inactive Problems ICD-10 Code Description Active Date Inactive Date L97.521 Non-pressure chronic ulcer of other part of left foot limited to breakdown of skin 06/12/2022 06/12/2022 Resolved Problems Electronic Signature(s) Signed: 01/04/2023 12:22:11 PM By: [name] MD FACS Entered By: [name] on 01/04/2023 09:22:10 -------------------------------------------------------------------------------- Progress Note Details Patient Name: Date of Service: [place] [name] [facility] RO N 01/04/2023 11:30 A M Medical Record Number: [id number] Patient Account Number: [id number] Date of Birth/Sex: Treating RN: [birth year] (82 y.o. F) Primary Care Provider: [name], [facility] Other Clinician: Referring Provider: Treating Provider/Extender: [name], [facility] Weeks in Treatment: 53 Subjective Chief Complaint Information obtained from Patient Patient seen for complaints of Non-Healing Wound. History of Present Illness (HPI) ADMISSION 06/12/2022 This is an 82 year old woman with a history of CVA, crest syndrome, atrial fibrillation, rocker-bottom foot deformity. 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 She has been followed by podiatry for this and I am not entirely clear as to how she came to be referred here. She resides in an assisted living facility. It is not clear what they have been putting on her wound, but on intake, she was noted to have denuded skin on her medial third toe, as well as ulcers on her dorsal great toe and lateral great toe. There is slough accumulation on both of the toe ulcers. There was an odor noted at intake, but after her foot was washed, the odor dissipated. Her toes are folded on top of each other creating areas of abrasion and pockets for moisture collection, which seems to be the primary cause of her ulceration. 06/20/2022: The skin between her toes and on the ball of her foot is  completely macerated. There has been more tissue breakdown. The wound on her great toe has some slough accumulation. 06/27/2022: No change to her wounds today. There has been no further deterioration, but no significant improvement. She was both hypotensive and bradycardic on intake. SEMAYA, BENNINGER ([id number]) [id number] Page 11 of 17 07/11/2022: Today, her foot is completely macerated. She reports that the wound care nurse actually soaked her foot and then applied foam, despite our specific orders  OFILIA, WOLFINGER ([id number]) [id number] Page 1 of 17 Visit Report for 01/04/2023 Chief Complaint Document Details Patient Name: Date of Service: [name] 01/04/2023 11:30 A M Medical Record Number: [id number] Patient Account Number: [id number] Date of Birth/Sex: Treating RN: 1940/08/22 (82 y.o. F) Primary Care Provider: [name], [facility] Other Clinician: Referring Provider: Treating Provider/Extender: [name], [facility] Weeks in Treatment: 78 Information Obtained from: Patient Chief Complaint Patient seen for complaints of Non-Healing Wound. Electronic Signature(s) Signed: 01/04/2023 12:24:31 PM By: [name] MD FACS Entered By: [name] on 01/04/2023 09:24:31 -------------------------------------------------------------------------------- Debridement Details Patient Name: Date of Service: [name], [facility] RO N 01/04/2023 11:30 A M Medical Record Number: [id number] Patient Account Number: [id number] Date of Birth/Sex: Treating RN: [birth year] (82 y.o. [name] Primary Care Provider: [name], [facility] Other Clinician: Referring Provider: Treating Provider/Extender: [name], [facility] Weeks in Treatment: 29 Debridement Performed for Assessment: Wound #10 Left T Second oe Performed By: Physician [name], MD The following information was scribed by: Brenton Grills The information was scribed for: [name] Debridement Type: Debridement Level of Consciousness (Pre-procedure): Awake and Alert Pre-procedure Verification/Time Out Yes - 12:10 Taken: Start Time: 12:11 Pain Control: Lidocaine 4% Topical Solution Percent of Wound Bed Debrided: 100% T Area Debrided (cm): otal 0.2 Tissue and other material debrided: Non-Viable, Eschar, Slough, Slough Level: Non-Viable Tissue Debridement Description: Selective/Open Wound Instrument: Curette Bleeding: Minimum Hemostasis Achieved:  Pressure Response to Treatment: Procedure was tolerated well Level of Consciousness (Post- Awake and Alert procedure): Post Debridement Measurements of Total Wound Length: (cm) 0.5 Width: (cm) 0.5 Depth: (cm) 0.1 Volume: (cm) 0.02 Character of Wound/Ulcer Post Debridement: Improved Post Procedure Diagnosis Pember, Rubina ([id number]) [id number] Page 2 of 17 Same as Pre-procedure Electronic Signature(s) Signed: 01/04/2023 12:31:09 PM By: [name] MD FACS Signed: 01/07/2023 12:35:20 PM By: Brenton Grills Entered By: Brenton Grills on 01/04/2023 09:14:43 -------------------------------------------------------------------------------- Debridement Details Patient Name: Date of Service: [name], [facility] RO N 01/04/2023 11:30 A M Medical Record Number: [id number] Patient Account Number: [id number] Date of Birth/Sex: Treating RN: 10-31-1940 (82 y.o. [name] Primary Care Provider: [name], [facility] Other Clinician: Referring Provider: Treating Provider/Extender: [name], [facility] Weeks in Treatment: 29 Debridement Performed for Assessment: Wound #6 Left,Dorsal Foot Performed By: Physician [name], MD The following information was scribed by: Brenton Grills The information was scribed for: [name] Debridement Type: Debridement Level of Consciousness (Pre-procedure): Awake and Alert Pre-procedure Verification/Time Out Yes - 12:10 Taken: Start Time: 12:11 Pain Control: Lidocaine 4% Topical Solution Percent of Wound Bed Debrided: 100% T Area Debrided (cm): otal 3.53 Tissue and other material debrided: Non-Viable, Eschar, Slough, Slough Level: Non-Viable Tissue Debridement Description: Selective/Open Wound Instrument: Curette Bleeding: Minimum Hemostasis Achieved: Pressure Response to Treatment: Procedure was tolerated well Level of Consciousness (Post- Awake and Alert procedure): Post Debridement Measurements  of Total Wound Length: (cm) 2.5 Width: (cm) 1.8 Depth: (cm) 0.1 Volume: (cm) 0.353 Character of Wound/Ulcer Post Debridement: Improved Post Procedure Diagnosis Same as Pre-procedure Electronic Signature(s) Signed: 01/04/2023 12:31:09 PM By: [name] MD FACS Signed: 01/07/2023 12:35:20 PM By: Brenton Grills Entered By: Brenton Grills on 01/04/2023 09:15:41 -------------------------------------------------------------------------------- Debridement Details Patient Name: Date of Service: [name], [facility] RO N 01/04/2023 11:30 A M Medical Record Number: [id number] Patient Account Number: [id number] Date of Birth/Sex: Treating RN: [birth year] (82 y.o. [name] Primary Care Provider: [name], [facility] Other Clinician: Marsh Dolly ([id number]) [id number] Page 3 of 17 Referring Provider: Treating  Provider/Extender: [name] SHO [name], [facility] Weeks in Treatment: 29 Debridement Performed for Assessment: Wound #8 Left,Lateral Foot Performed By: Physician [name], MD The following information was scribed by: Brenton Grills The information was scribed for: [name] Debridement Type: Debridement Level of Consciousness (Pre-procedure): Awake and Alert Pre-procedure Verification/Time Out Yes - 12:10 Taken: Start Time: 12:11 Pain Control: Lidocaine 4% Topical Solution Percent of Wound Bed Debrided: 100% T Area Debrided (cm): otal 0.5 Tissue and other material debrided: Non-Viable, Eschar, Slough, Slough Level: Non-Viable Tissue Debridement Description: Selective/Open Wound Instrument: Curette Bleeding: Minimum Hemostasis Achieved: Pressure Response to Treatment: Procedure was tolerated well Level of Consciousness (Post- Awake and Alert procedure): Post Debridement Measurements of Total Wound Length: (cm) 0.8 Stage: Category/Stage IV Width: (cm) 0.8 Depth: (cm) 0.2 Volume: (cm) 0.101 Character of Wound/Ulcer Post  Debridement: Improved Post Procedure Diagnosis Same as Pre-procedure Electronic Signature(s) Signed: 01/04/2023 12:31:09 PM By: [name] MD FACS Signed: 01/07/2023 12:35:20 PM By: Brenton Grills Entered By: Brenton Grills on 01/04/2023 09:16:27 -------------------------------------------------------------------------------- Debridement Details Patient Name: Date of Service: [name], [facility] RO N 01/04/2023 11:30 A M Medical Record Number: [id number] Patient Account Number: [id number] Date of Birth/Sex: Treating RN: 07/05/1940 (82 y.o. [name] Primary Care Provider: [name], [facility] Other Clinician: Referring Provider: Treating Provider/Extender: [name], [facility] Weeks in Treatment: 29 Debridement Performed for Assessment: Wound #9 Left,Lateral T Great oe Performed By: Physician [name], MD The following information was scribed by: Brenton Grills The information was scribed for: [name] Debridement Type: Debridement Level of Consciousness (Pre-procedure): Awake and Alert Pre-procedure Verification/Time Out Yes - 12:10 Taken: Start Time: 12:11 Pain Control: Lidocaine 4% Topical Solution Percent of Wound Bed Debrided: 100% T Area Debrided (cm): otal 0.01 Tissue and other material debrided: Non-Viable, Eschar, Slough, Slough Level: Non-Viable Tissue Debridement Description: Selective/Open Wound Instrument: Curette Bleeding: Minimum Hemostasis Achieved: Pressure Response to Treatment: Procedure was tolerated well Level of Consciousness (Post- Awake and Alert Tanton, Rashanna ([id number]) [phone].pdf Page 4 of 17 Awake and Alert procedure): Post Debridement Measurements of Total Wound Length: (cm) 0.1 Width: (cm) 0.1 Depth: (cm) 0.5 Volume: (cm) 0.004 Character of Wound/Ulcer Post Debridement: Improved Post Procedure Diagnosis Same as Pre-procedure Electronic Signature(s) Signed: 01/04/2023 12:31:09 PM  By: [name] MD FACS Signed: 01/07/2023 12:35:20 PM By: Brenton Grills Entered By: Brenton Grills on 01/04/2023 09:17:19 -------------------------------------------------------------------------------- HPI Details Patient Name: Date of Service: [name], [facility] RO N 01/04/2023 11:30 A M Medical Record Number: [id number] Patient Account Number: [id number] Date of Birth/Sex: Treating RN: 04-17-1941 (82 y.o. F) Primary Care Provider: [name], [facility] Other Clinician: Referring Provider: Treating Provider/Extender: [name], [facility] Weeks in Treatment: 60 History of Present Illness HPI Description: ADMISSION 06/12/2022 This is an 82 year old woman with a history of CVA, crest syndrome, atrial fibrillation, rocker-bottom foot deformity. 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 She has been followed by podiatry for this and I am not entirely clear as to how she came to be referred here. She resides in an assisted living facility. It is not clear what they have been putting on her wound, but on intake, she was noted to have denuded skin on her medial third toe, as well as ulcers on her dorsal great toe and lateral great toe. There is slough accumulation on both of the toe ulcers. There was an odor noted at intake, but after her foot was washed, the odor dissipated. Her toes are folded  directed (alternative to 3 layer compression). Electronic Signature(s) Signed: 01/04/2023 12:31:09 PM By: [name] MD FACS Entered By: [name] on 01/04/2023 09:29:16 -------------------------------------------------------------------------------- Problem List Details Patient Name: Date of Service: [name] [facility] RO N 01/04/2023 11:30 A M Medical Record Number: [id number] Patient Account Number: [id number] Date of Birth/Sex: Treating RN: 1940-05-24 (82 y.o. 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 10/03/2022 No Yes without evidence of necrosis L97.525 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 11/01/2022 No Yes without evidence of necrosis L97.812 Non-pressure chronic ulcer of other part of right lower leg with fat layer 12/06/2022 No Yes exposed 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 06/12/2022 No Yes Saine, Fleeta ([id number]) [phone].pdf Page 10 of [phone] Essential (primary) hypertension 06/12/2022 No Yes 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 06/12/2022 No Yes 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 atrial fibrillation 06/12/2022 No Yes Z79.01 Long term (current) use of anticoagulants 06/12/2022 No Yes I89.0 Lymphedema, not elsewhere  classified 08/20/2022 No Yes Inactive Problems ICD-10 Code Description Active Date Inactive Date L97.521 Non-pressure chronic ulcer of other part of left foot limited to breakdown of skin 06/12/2022 06/12/2022 Resolved Problems Electronic Signature(s) Signed: 01/04/2023 12:22:11 PM By: [name] MD FACS Entered By: [name] on 01/04/2023 09:22:10 -------------------------------------------------------------------------------- Progress Note Details Patient Name: Date of Service: [place] [name] [facility] RO N 01/04/2023 11:30 A M Medical Record Number: [id number] Patient Account Number: [id number] Date of Birth/Sex: Treating RN: [birth year] (82 y.o. F) Primary Care Provider: [name], [facility] Other Clinician: Referring Provider: Treating Provider/Extender: [name], [facility] Weeks in Treatment: 53 Subjective Chief Complaint Information obtained from Patient Patient seen for complaints of Non-Healing Wound. History of Present Illness (HPI) ADMISSION 06/12/2022 This is an 82 year old woman with a history of CVA, crest syndrome, atrial fibrillation, rocker-bottom foot deformity. 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 She has been followed by podiatry for this and I am not entirely clear as to how she came to be referred here. She resides in an assisted living facility. It is not clear what they have been putting on her wound, but on intake, she was noted to have denuded skin on her medial third toe, as well as ulcers on her dorsal great toe and lateral great toe. There is slough accumulation on both of the toe ulcers. There was an odor noted at intake, but after her foot was washed, the odor dissipated. Her toes are folded on top of each other creating areas of abrasion and pockets for moisture collection, which seems to be the primary cause of her ulceration. 06/20/2022: The skin between her toes and on the ball of her foot is  completely macerated. There has been more tissue breakdown. The wound on her great toe has some slough accumulation. 06/27/2022: No change to her wounds today. There has been no further deterioration, but no significant improvement. She was both hypotensive and bradycardic on intake. SEMAYA, BENNINGER ([id number]) [id number] Page 11 of 17 07/11/2022: Today, her foot is completely macerated. She reports that the wound care nurse actually soaked her foot and then applied foam, despite our specific orders  Provider/Extender: [name] SHO [name], [facility] Weeks in Treatment: 29 Debridement Performed for Assessment: Wound #8 Left,Lateral Foot Performed By: Physician [name], MD The following information was scribed by: Brenton Grills The information was scribed for: [name] Debridement Type: Debridement Level of Consciousness (Pre-procedure): Awake and Alert Pre-procedure Verification/Time Out Yes - 12:10 Taken: Start Time: 12:11 Pain Control: Lidocaine 4% Topical Solution Percent of Wound Bed Debrided: 100% T Area Debrided (cm): otal 0.5 Tissue and other material debrided: Non-Viable, Eschar, Slough, Slough Level: Non-Viable Tissue Debridement Description: Selective/Open Wound Instrument: Curette Bleeding: Minimum Hemostasis Achieved: Pressure Response to Treatment: Procedure was tolerated well Level of Consciousness (Post- Awake and Alert procedure): Post Debridement Measurements of Total Wound Length: (cm) 0.8 Stage: Category/Stage IV Width: (cm) 0.8 Depth: (cm) 0.2 Volume: (cm) 0.101 Character of Wound/Ulcer Post  Debridement: Improved Post Procedure Diagnosis Same as Pre-procedure Electronic Signature(s) Signed: 01/04/2023 12:31:09 PM By: [name] MD FACS Signed: 01/07/2023 12:35:20 PM By: Brenton Grills Entered By: Brenton Grills on 01/04/2023 09:16:27 -------------------------------------------------------------------------------- Debridement Details Patient Name: Date of Service: [name], [facility] RO N 01/04/2023 11:30 A M Medical Record Number: [id number] Patient Account Number: [id number] Date of Birth/Sex: Treating RN: 07/05/1940 (82 y.o. [name] Primary Care Provider: [name], [facility] Other Clinician: Referring Provider: Treating Provider/Extender: [name], [facility] Weeks in Treatment: 29 Debridement Performed for Assessment: Wound #9 Left,Lateral T Great oe Performed By: Physician [name], MD The following information was scribed by: Brenton Grills The information was scribed for: [name] Debridement Type: Debridement Level of Consciousness (Pre-procedure): Awake and Alert Pre-procedure Verification/Time Out Yes - 12:10 Taken: Start Time: 12:11 Pain Control: Lidocaine 4% Topical Solution Percent of Wound Bed Debrided: 100% T Area Debrided (cm): otal 0.01 Tissue and other material debrided: Non-Viable, Eschar, Slough, Slough Level: Non-Viable Tissue Debridement Description: Selective/Open Wound Instrument: Curette Bleeding: Minimum Hemostasis Achieved: Pressure Response to Treatment: Procedure was tolerated well Level of Consciousness (Post- Awake and Alert Tanton, Rashanna ([id number]) [phone].pdf Page 4 of 17 Awake and Alert procedure): Post Debridement Measurements of Total Wound Length: (cm) 0.1 Width: (cm) 0.1 Depth: (cm) 0.5 Volume: (cm) 0.004 Character of Wound/Ulcer Post Debridement: Improved Post Procedure Diagnosis Same as Pre-procedure Electronic Signature(s) Signed: 01/04/2023 12:31:09 PM  By: [name] MD FACS Signed: 01/07/2023 12:35:20 PM By: Brenton Grills Entered By: Brenton Grills on 01/04/2023 09:17:19 -------------------------------------------------------------------------------- HPI Details Patient Name: Date of Service: [name], [facility] RO N 01/04/2023 11:30 A M Medical Record Number: [id number] Patient Account Number: [id number] Date of Birth/Sex: Treating RN: 04-17-1941 (82 y.o. F) Primary Care Provider: [name], [facility] Other Clinician: Referring Provider: Treating Provider/Extender: [name], [facility] Weeks in Treatment: 60 History of Present Illness HPI Description: ADMISSION 06/12/2022 This is an 82 year old woman with a history of CVA, crest syndrome, atrial fibrillation, rocker-bottom foot deformity. 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 She has been followed by podiatry for this and I am not entirely clear as to how she came to be referred here. She resides in an assisted living facility. It is not clear what they have been putting on her wound, but on intake, she was noted to have denuded skin on her medial third toe, as well as ulcers on her dorsal great toe and lateral great toe. There is slough accumulation on both of the toe ulcers. There was an odor noted at intake, but after her foot was washed, the odor dissipated. Her toes are folded  Provider/Extender: [name] SHO [name], [facility] Weeks in Treatment: 29 Debridement Performed for Assessment: Wound #8 Left,Lateral Foot Performed By: Physician [name], MD The following information was scribed by: Brenton Grills The information was scribed for: [name] Debridement Type: Debridement Level of Consciousness (Pre-procedure): Awake and Alert Pre-procedure Verification/Time Out Yes - 12:10 Taken: Start Time: 12:11 Pain Control: Lidocaine 4% Topical Solution Percent of Wound Bed Debrided: 100% T Area Debrided (cm): otal 0.5 Tissue and other material debrided: Non-Viable, Eschar, Slough, Slough Level: Non-Viable Tissue Debridement Description: Selective/Open Wound Instrument: Curette Bleeding: Minimum Hemostasis Achieved: Pressure Response to Treatment: Procedure was tolerated well Level of Consciousness (Post- Awake and Alert procedure): Post Debridement Measurements of Total Wound Length: (cm) 0.8 Stage: Category/Stage IV Width: (cm) 0.8 Depth: (cm) 0.2 Volume: (cm) 0.101 Character of Wound/Ulcer Post  Debridement: Improved Post Procedure Diagnosis Same as Pre-procedure Electronic Signature(s) Signed: 01/04/2023 12:31:09 PM By: [name] MD FACS Signed: 01/07/2023 12:35:20 PM By: Brenton Grills Entered By: Brenton Grills on 01/04/2023 09:16:27 -------------------------------------------------------------------------------- Debridement Details Patient Name: Date of Service: [name], [facility] RO N 01/04/2023 11:30 A M Medical Record Number: [id number] Patient Account Number: [id number] Date of Birth/Sex: Treating RN: 07/05/1940 (82 y.o. [name] Primary Care Provider: [name], [facility] Other Clinician: Referring Provider: Treating Provider/Extender: [name], [facility] Weeks in Treatment: 29 Debridement Performed for Assessment: Wound #9 Left,Lateral T Great oe Performed By: Physician [name], MD The following information was scribed by: Brenton Grills The information was scribed for: [name] Debridement Type: Debridement Level of Consciousness (Pre-procedure): Awake and Alert Pre-procedure Verification/Time Out Yes - 12:10 Taken: Start Time: 12:11 Pain Control: Lidocaine 4% Topical Solution Percent of Wound Bed Debrided: 100% T Area Debrided (cm): otal 0.01 Tissue and other material debrided: Non-Viable, Eschar, Slough, Slough Level: Non-Viable Tissue Debridement Description: Selective/Open Wound Instrument: Curette Bleeding: Minimum Hemostasis Achieved: Pressure Response to Treatment: Procedure was tolerated well Level of Consciousness (Post- Awake and Alert Tanton, Rashanna ([id number]) [phone].pdf Page 4 of 17 Awake and Alert procedure): Post Debridement Measurements of Total Wound Length: (cm) 0.1 Width: (cm) 0.1 Depth: (cm) 0.5 Volume: (cm) 0.004 Character of Wound/Ulcer Post Debridement: Improved Post Procedure Diagnosis Same as Pre-procedure Electronic Signature(s) Signed: 01/04/2023 12:31:09 PM  By: [name] MD FACS Signed: 01/07/2023 12:35:20 PM By: Brenton Grills Entered By: Brenton Grills on 01/04/2023 09:17:19 -------------------------------------------------------------------------------- HPI Details Patient Name: Date of Service: [name], [facility] RO N 01/04/2023 11:30 A M Medical Record Number: [id number] Patient Account Number: [id number] Date of Birth/Sex: Treating RN: 04-17-1941 (82 y.o. F) Primary Care Provider: [name], [facility] Other Clinician: Referring Provider: Treating Provider/Extender: [name], [facility] Weeks in Treatment: 60 History of Present Illness HPI Description: ADMISSION 06/12/2022 This is an 82 year old woman with a history of CVA, crest syndrome, atrial fibrillation, rocker-bottom foot deformity. 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 She has been followed by podiatry for this and I am not entirely clear as to how she came to be referred here. She resides in an assisted living facility. It is not clear what they have been putting on her wound, but on intake, she was noted to have denuded skin on her medial third toe, as well as ulcers on her dorsal great toe and lateral great toe. There is slough accumulation on both of the toe ulcers. There was an odor noted at intake, but after her foot was washed, the odor dissipated. Her toes are folded  Provider/Extender: [name] SHO [name], [facility] Weeks in Treatment: 29 Debridement Performed for Assessment: Wound #8 Left,Lateral Foot Performed By: Physician [name], MD The following information was scribed by: Brenton Grills The information was scribed for: [name] Debridement Type: Debridement Level of Consciousness (Pre-procedure): Awake and Alert Pre-procedure Verification/Time Out Yes - 12:10 Taken: Start Time: 12:11 Pain Control: Lidocaine 4% Topical Solution Percent of Wound Bed Debrided: 100% T Area Debrided (cm): otal 0.5 Tissue and other material debrided: Non-Viable, Eschar, Slough, Slough Level: Non-Viable Tissue Debridement Description: Selective/Open Wound Instrument: Curette Bleeding: Minimum Hemostasis Achieved: Pressure Response to Treatment: Procedure was tolerated well Level of Consciousness (Post- Awake and Alert procedure): Post Debridement Measurements of Total Wound Length: (cm) 0.8 Stage: Category/Stage IV Width: (cm) 0.8 Depth: (cm) 0.2 Volume: (cm) 0.101 Character of Wound/Ulcer Post  Debridement: Improved Post Procedure Diagnosis Same as Pre-procedure Electronic Signature(s) Signed: 01/04/2023 12:31:09 PM By: [name] MD FACS Signed: 01/07/2023 12:35:20 PM By: Brenton Grills Entered By: Brenton Grills on 01/04/2023 09:16:27 -------------------------------------------------------------------------------- Debridement Details Patient Name: Date of Service: [name], [facility] RO N 01/04/2023 11:30 A M Medical Record Number: [id number] Patient Account Number: [id number] Date of Birth/Sex: Treating RN: 07/05/1940 (82 y.o. [name] Primary Care Provider: [name], [facility] Other Clinician: Referring Provider: Treating Provider/Extender: [name], [facility] Weeks in Treatment: 29 Debridement Performed for Assessment: Wound #9 Left,Lateral T Great oe Performed By: Physician [name], MD The following information was scribed by: Brenton Grills The information was scribed for: [name] Debridement Type: Debridement Level of Consciousness (Pre-procedure): Awake and Alert Pre-procedure Verification/Time Out Yes - 12:10 Taken: Start Time: 12:11 Pain Control: Lidocaine 4% Topical Solution Percent of Wound Bed Debrided: 100% T Area Debrided (cm): otal 0.01 Tissue and other material debrided: Non-Viable, Eschar, Slough, Slough Level: Non-Viable Tissue Debridement Description: Selective/Open Wound Instrument: Curette Bleeding: Minimum Hemostasis Achieved: Pressure Response to Treatment: Procedure was tolerated well Level of Consciousness (Post- Awake and Alert Tanton, Rashanna ([id number]) [phone].pdf Page 4 of 17 Awake and Alert procedure): Post Debridement Measurements of Total Wound Length: (cm) 0.1 Width: (cm) 0.1 Depth: (cm) 0.5 Volume: (cm) 0.004 Character of Wound/Ulcer Post Debridement: Improved Post Procedure Diagnosis Same as Pre-procedure Electronic Signature(s) Signed: 01/04/2023 12:31:09 PM  By: [name] MD FACS Signed: 01/07/2023 12:35:20 PM By: Brenton Grills Entered By: Brenton Grills on 01/04/2023 09:17:19 -------------------------------------------------------------------------------- HPI Details Patient Name: Date of Service: [name], [facility] RO N 01/04/2023 11:30 A M Medical Record Number: [id number] Patient Account Number: [id number] Date of Birth/Sex: Treating RN: 04-17-1941 (82 y.o. F) Primary Care Provider: [name], [facility] Other Clinician: Referring Provider: Treating Provider/Extender: [name], [facility] Weeks in Treatment: 60 History of Present Illness HPI Description: ADMISSION 06/12/2022 This is an 82 year old woman with a history of CVA, crest syndrome, atrial fibrillation, rocker-bottom foot deformity. 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 She has been followed by podiatry for this and I am not entirely clear as to how she came to be referred here. She resides in an assisted living facility. It is not clear what they have been putting on her wound, but on intake, she was noted to have denuded skin on her medial third toe, as well as ulcers on her dorsal great toe and lateral great toe. There is slough accumulation on both of the toe ulcers. There was an odor noted at intake, but after her foot was washed, the odor dissipated. Her toes are folded  Apply over primary dressing as directed. Secondary Dressing: Drawtex 4x4 in 1 x Per Week/30 Days Discharge Instructions: Apply over primary dressing as directed. Secondary Dressing: Woven Gauze Sponge, Non-Sterile 4x4 in 1 x Per Week/30 Days Discharge Instructions: can use gauze between toes (if no desired) Secondary Dressing: Zetuvit Plus 4x8 in 1 x Per Week/30 Days Discharge Instructions: Apply over primary dressing as directed. Com pression Wrap: Urgo K2 Lite, (equivalent to a 3 layer) two layer compression system, regular 1 x Per Week/30 Days Discharge Instructions: Apply Urgo K2 Lite as directed (alternative to 3 layer compression). WOUND #9: - T Great Wound Laterality: Left, Lateral oe Cleanser: Soap and Water 1 x Per Week/30 Days Discharge Instructions: May shower and wash  wound with dial antibacterial soap and water prior to dressing change. Cleanser: Wound Cleanser 1 x Per Week/30 Days Discharge Instructions: Cleanse the wound with wound cleanser prior to applying a clean dressing using gauze sponges, not tissue or cotton balls. Peri-Wound Care: Sween Lotion (Moisturizing lotion) 1 x Per Week/30 Days Discharge Instructions: Apply moisturizing lotion as directed Topical: Gentamicin 1 x Per Week/30 Days Discharge Instructions: As directed by physician Topical: Mupirocin Ointment 1 x Per Week/30 Days Discharge Instructions: Apply Mupirocin (Bactroban) as instructed Topical: Ketoconazole Cream 2% 1 x Per Week/30 Days Discharge Instructions: Apply Ketoconazole as directed Topical: Triamcinolone 1 x Per Week/30 Days Discharge Instructions: Apply Triamcinolone as directed Topical: zinc 1 x Per Week/30 Days Prim Dressing: Endoform 2x2 in 1 x Per Week/30 Days ary Discharge Instructions: Moisten with saline Secondary Dressing: ABD Pad, 8x10 1 x Per Week/30 Days Discharge Instructions: Apply over primary dressing as directed. Secondary Dressing: Drawtex 4x4 in 1 x Per Week/30 Days Discharge Instructions: Apply over primary dressing as directed. Secondary Dressing: Woven Gauze Sponge, Non-Sterile 4x4 in 1 x Per Week/30 Days Discharge Instructions: can use gauze between toes (if no desired) Secondary Dressing: Zetuvit Plus 4x8 in 1 x Per Week/30 Days Discharge Instructions: Apply over primary dressing as directed. Com pression Wrap: Urgo K2 Lite, (equivalent to a 3 layer) two layer compression system, regular 1 x Per Week/30 Days Discharge Instructions: Apply Urgo K2 Lite as directed (alternative to 3 layer compression). 01/04/2023: There has been no significant change to any of her wounds, but fortunately there has been no deterioration. I used a curette to debride slough and eschar from all of the lip surfaces. There are continue the mixture of topical gentamicin and  mupirocin with ketoconazole and endoform with Urgo light compression wrap. Follow-up in 1 week. Electronic Signature(s) Signed: 01/04/2023 12:30:26 PM By: [name] MD FACS Entered By: [name] on 01/04/2023 09:30:25 -------------------------------------------------------------------------------- HxROS Details Patient Name: Date of Service: [place] [name], [facility] RO N 01/04/2023 11:30 A M Medical Record Number: [id number] Patient Account Number: [id number] Date of Birth/Sex: Treating RN: [birth year] (82 y.o. 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 Endocrine Medical History: Negative for: Type I Diabetes; Type II Diabetes Past Medical History Notes: hypothyroidism Musculoskeletal Medical History: Positive for: 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 Neurologic Medical History: Past Medical History Notes: stroke Immunizations Pneumococcal Vaccine: Received Pneumococcal Vaccination: Yes Received Pneumococcal Vaccination On or After 60th Birthday: Yes Implantable Devices None Hospitalization / Surgery  Provider/Extender: [name] SHO [name], [facility] Weeks in Treatment: 29 Debridement Performed for Assessment: Wound #8 Left,Lateral Foot Performed By: Physician [name], MD The following information was scribed by: Brenton Grills The information was scribed for: [name] Debridement Type: Debridement Level of Consciousness (Pre-procedure): Awake and Alert Pre-procedure Verification/Time Out Yes - 12:10 Taken: Start Time: 12:11 Pain Control: Lidocaine 4% Topical Solution Percent of Wound Bed Debrided: 100% T Area Debrided (cm): otal 0.5 Tissue and other material debrided: Non-Viable, Eschar, Slough, Slough Level: Non-Viable Tissue Debridement Description: Selective/Open Wound Instrument: Curette Bleeding: Minimum Hemostasis Achieved: Pressure Response to Treatment: Procedure was tolerated well Level of Consciousness (Post- Awake and Alert procedure): Post Debridement Measurements of Total Wound Length: (cm) 0.8 Stage: Category/Stage IV Width: (cm) 0.8 Depth: (cm) 0.2 Volume: (cm) 0.101 Character of Wound/Ulcer Post  Debridement: Improved Post Procedure Diagnosis Same as Pre-procedure Electronic Signature(s) Signed: 01/04/2023 12:31:09 PM By: [name] MD FACS Signed: 01/07/2023 12:35:20 PM By: Brenton Grills Entered By: Brenton Grills on 01/04/2023 09:16:27 -------------------------------------------------------------------------------- Debridement Details Patient Name: Date of Service: [name], [facility] RO N 01/04/2023 11:30 A M Medical Record Number: [id number] Patient Account Number: [id number] Date of Birth/Sex: Treating RN: 07/05/1940 (82 y.o. [name] Primary Care Provider: [name], [facility] Other Clinician: Referring Provider: Treating Provider/Extender: [name], [facility] Weeks in Treatment: 29 Debridement Performed for Assessment: Wound #9 Left,Lateral T Great oe Performed By: Physician [name], MD The following information was scribed by: Brenton Grills The information was scribed for: [name] Debridement Type: Debridement Level of Consciousness (Pre-procedure): Awake and Alert Pre-procedure Verification/Time Out Yes - 12:10 Taken: Start Time: 12:11 Pain Control: Lidocaine 4% Topical Solution Percent of Wound Bed Debrided: 100% T Area Debrided (cm): otal 0.01 Tissue and other material debrided: Non-Viable, Eschar, Slough, Slough Level: Non-Viable Tissue Debridement Description: Selective/Open Wound Instrument: Curette Bleeding: Minimum Hemostasis Achieved: Pressure Response to Treatment: Procedure was tolerated well Level of Consciousness (Post- Awake and Alert Tanton, Rashanna ([id number]) [phone].pdf Page 4 of 17 Awake and Alert procedure): Post Debridement Measurements of Total Wound Length: (cm) 0.1 Width: (cm) 0.1 Depth: (cm) 0.5 Volume: (cm) 0.004 Character of Wound/Ulcer Post Debridement: Improved Post Procedure Diagnosis Same as Pre-procedure Electronic Signature(s) Signed: 01/04/2023 12:31:09 PM  By: [name] MD FACS Signed: 01/07/2023 12:35:20 PM By: Brenton Grills Entered By: Brenton Grills on 01/04/2023 09:17:19 -------------------------------------------------------------------------------- HPI Details Patient Name: Date of Service: [name], [facility] RO N 01/04/2023 11:30 A M Medical Record Number: [id number] Patient Account Number: [id number] Date of Birth/Sex: Treating RN: 04-17-1941 (82 y.o. F) Primary Care Provider: [name], [facility] Other Clinician: Referring Provider: Treating Provider/Extender: [name], [facility] Weeks in Treatment: 60 History of Present Illness HPI Description: ADMISSION 06/12/2022 This is an 82 year old woman with a history of CVA, crest syndrome, atrial fibrillation, rocker-bottom foot deformity. 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 She has been followed by podiatry for this and I am not entirely clear as to how she came to be referred here. She resides in an assisted living facility. It is not clear what they have been putting on her wound, but on intake, she was noted to have denuded skin on her medial third toe, as well as ulcers on her dorsal great toe and lateral great toe. There is slough accumulation on both of the toe ulcers. There was an odor noted at intake, but after her foot was washed, the odor dissipated. Her toes are folded  Provider/Extender: [name] SHO [name], [facility] Weeks in Treatment: 29 Debridement Performed for Assessment: Wound #8 Left,Lateral Foot Performed By: Physician [name], MD The following information was scribed by: Brenton Grills The information was scribed for: [name] Debridement Type: Debridement Level of Consciousness (Pre-procedure): Awake and Alert Pre-procedure Verification/Time Out Yes - 12:10 Taken: Start Time: 12:11 Pain Control: Lidocaine 4% Topical Solution Percent of Wound Bed Debrided: 100% T Area Debrided (cm): otal 0.5 Tissue and other material debrided: Non-Viable, Eschar, Slough, Slough Level: Non-Viable Tissue Debridement Description: Selective/Open Wound Instrument: Curette Bleeding: Minimum Hemostasis Achieved: Pressure Response to Treatment: Procedure was tolerated well Level of Consciousness (Post- Awake and Alert procedure): Post Debridement Measurements of Total Wound Length: (cm) 0.8 Stage: Category/Stage IV Width: (cm) 0.8 Depth: (cm) 0.2 Volume: (cm) 0.101 Character of Wound/Ulcer Post  Debridement: Improved Post Procedure Diagnosis Same as Pre-procedure Electronic Signature(s) Signed: 01/04/2023 12:31:09 PM By: [name] MD FACS Signed: 01/07/2023 12:35:20 PM By: Brenton Grills Entered By: Brenton Grills on 01/04/2023 09:16:27 -------------------------------------------------------------------------------- Debridement Details Patient Name: Date of Service: [name], [facility] RO N 01/04/2023 11:30 A M Medical Record Number: [id number] Patient Account Number: [id number] Date of Birth/Sex: Treating RN: 07/05/1940 (82 y.o. [name] Primary Care Provider: [name], [facility] Other Clinician: Referring Provider: Treating Provider/Extender: [name], [facility] Weeks in Treatment: 29 Debridement Performed for Assessment: Wound #9 Left,Lateral T Great oe Performed By: Physician [name], MD The following information was scribed by: Brenton Grills The information was scribed for: [name] Debridement Type: Debridement Level of Consciousness (Pre-procedure): Awake and Alert Pre-procedure Verification/Time Out Yes - 12:10 Taken: Start Time: 12:11 Pain Control: Lidocaine 4% Topical Solution Percent of Wound Bed Debrided: 100% T Area Debrided (cm): otal 0.01 Tissue and other material debrided: Non-Viable, Eschar, Slough, Slough Level: Non-Viable Tissue Debridement Description: Selective/Open Wound Instrument: Curette Bleeding: Minimum Hemostasis Achieved: Pressure Response to Treatment: Procedure was tolerated well Level of Consciousness (Post- Awake and Alert Tanton, Rashanna ([id number]) [phone].pdf Page 4 of 17 Awake and Alert procedure): Post Debridement Measurements of Total Wound Length: (cm) 0.1 Width: (cm) 0.1 Depth: (cm) 0.5 Volume: (cm) 0.004 Character of Wound/Ulcer Post Debridement: Improved Post Procedure Diagnosis Same as Pre-procedure Electronic Signature(s) Signed: 01/04/2023 12:31:09 PM  By: [name] MD FACS Signed: 01/07/2023 12:35:20 PM By: Brenton Grills Entered By: Brenton Grills on 01/04/2023 09:17:19 -------------------------------------------------------------------------------- HPI Details Patient Name: Date of Service: [name], [facility] RO N 01/04/2023 11:30 A M Medical Record Number: [id number] Patient Account Number: [id number] Date of Birth/Sex: Treating RN: 04-17-1941 (82 y.o. F) Primary Care Provider: [name], [facility] Other Clinician: Referring Provider: Treating Provider/Extender: [name], [facility] Weeks in Treatment: 60 History of Present Illness HPI Description: ADMISSION 06/12/2022 This is an 82 year old woman with a history of CVA, crest syndrome, atrial fibrillation, rocker-bottom foot deformity. 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 She has been followed by podiatry for this and I am not entirely clear as to how she came to be referred here. She resides in an assisted living facility. It is not clear what they have been putting on her wound, but on intake, she was noted to have denuded skin on her medial third toe, as well as ulcers on her dorsal great toe and lateral great toe. There is slough accumulation on both of the toe ulcers. There was an odor noted at intake, but after her foot was washed, the odor dissipated. Her toes are folded  Apply over primary dressing as directed. Secondary Dressing: Drawtex 4x4 in 1 x Per Week/30 Days Discharge Instructions: Apply over primary dressing as directed. Secondary Dressing: Woven Gauze Sponge, Non-Sterile 4x4 in 1 x Per Week/30 Days Discharge Instructions: can use gauze between toes (if no desired) Secondary Dressing: Zetuvit Plus 4x8 in 1 x Per Week/30 Days Discharge Instructions: Apply over primary dressing as directed. Com pression Wrap: Urgo K2 Lite, (equivalent to a 3 layer) two layer compression system, regular 1 x Per Week/30 Days Discharge Instructions: Apply Urgo K2 Lite as directed (alternative to 3 layer compression). WOUND #9: - T Great Wound Laterality: Left, Lateral oe Cleanser: Soap and Water 1 x Per Week/30 Days Discharge Instructions: May shower and wash  wound with dial antibacterial soap and water prior to dressing change. Cleanser: Wound Cleanser 1 x Per Week/30 Days Discharge Instructions: Cleanse the wound with wound cleanser prior to applying a clean dressing using gauze sponges, not tissue or cotton balls. Peri-Wound Care: Sween Lotion (Moisturizing lotion) 1 x Per Week/30 Days Discharge Instructions: Apply moisturizing lotion as directed Topical: Gentamicin 1 x Per Week/30 Days Discharge Instructions: As directed by physician Topical: Mupirocin Ointment 1 x Per Week/30 Days Discharge Instructions: Apply Mupirocin (Bactroban) as instructed Topical: Ketoconazole Cream 2% 1 x Per Week/30 Days Discharge Instructions: Apply Ketoconazole as directed Topical: Triamcinolone 1 x Per Week/30 Days Discharge Instructions: Apply Triamcinolone as directed Topical: zinc 1 x Per Week/30 Days Prim Dressing: Endoform 2x2 in 1 x Per Week/30 Days ary Discharge Instructions: Moisten with saline Secondary Dressing: ABD Pad, 8x10 1 x Per Week/30 Days Discharge Instructions: Apply over primary dressing as directed. Secondary Dressing: Drawtex 4x4 in 1 x Per Week/30 Days Discharge Instructions: Apply over primary dressing as directed. Secondary Dressing: Woven Gauze Sponge, Non-Sterile 4x4 in 1 x Per Week/30 Days Discharge Instructions: can use gauze between toes (if no desired) Secondary Dressing: Zetuvit Plus 4x8 in 1 x Per Week/30 Days Discharge Instructions: Apply over primary dressing as directed. Com pression Wrap: Urgo K2 Lite, (equivalent to a 3 layer) two layer compression system, regular 1 x Per Week/30 Days Discharge Instructions: Apply Urgo K2 Lite as directed (alternative to 3 layer compression). 01/04/2023: There has been no significant change to any of her wounds, but fortunately there has been no deterioration. I used a curette to debride slough and eschar from all of the lip surfaces. There are continue the mixture of topical gentamicin and  mupirocin with ketoconazole and endoform with Urgo light compression wrap. Follow-up in 1 week. Electronic Signature(s) Signed: 01/04/2023 12:30:26 PM By: [name] MD FACS Entered By: [name] on 01/04/2023 09:30:25 -------------------------------------------------------------------------------- HxROS Details Patient Name: Date of Service: [place] [name], [facility] RO N 01/04/2023 11:30 A M Medical Record Number: [id number] Patient Account Number: [id number] Date of Birth/Sex: Treating RN: [birth year] (82 y.o. 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 Electronic Signature(s) Signed: 01/04/2023 12:31:09 PM By: [name] MD FACS Entered By: [name] on 01/04/2023 09:29:16 -------------------------------------------------------------------------------- Problem List Details Patient Name: Date of Service: [name] [facility] RO N 01/04/2023 11:30 A M Medical Record Number: [id number] Patient Account Number: [id number] Date of Birth/Sex: Treating RN: 1940-05-24 (82 y.o. 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 10/03/2022 No Yes without evidence of necrosis L97.525 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 11/01/2022 No Yes without evidence of necrosis L97.812 Non-pressure chronic ulcer of other part of right lower leg with fat layer 12/06/2022 No Yes exposed 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 06/12/2022 No Yes Saine, Fleeta ([id number]) [phone].pdf Page 10 of [phone] Essential (primary) hypertension 06/12/2022 No Yes 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 06/12/2022 No Yes 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 atrial fibrillation 06/12/2022 No Yes Z79.01 Long term (current) use of anticoagulants 06/12/2022 No Yes I89.0 Lymphedema, not elsewhere  classified 08/20/2022 No Yes Inactive Problems ICD-10 Code Description Active Date Inactive Date L97.521 Non-pressure chronic ulcer of other part of left foot limited to breakdown of skin 06/12/2022 06/12/2022 Resolved Problems Electronic Signature(s) Signed: 01/04/2023 12:22:11 PM By: [name] MD FACS Entered By: [name] on 01/04/2023 09:22:10 -------------------------------------------------------------------------------- Progress Note Details Patient Name: Date of Service: [place] [name] [facility] RO N 01/04/2023 11:30 A M Medical Record Number: [id number] Patient Account Number: [id number] Date of Birth/Sex: Treating RN: [birth year] (82 y.o. F) Primary Care Provider: [name], [facility] Other Clinician: Referring Provider: Treating Provider/Extender: [name], [facility] Weeks in Treatment: 53 Subjective Chief Complaint Information obtained from Patient Patient seen for complaints of Non-Healing Wound. History of Present Illness (HPI) ADMISSION 06/12/2022 This is an 82 year old woman with a history of CVA, crest syndrome, atrial fibrillation, rocker-bottom foot deformity. 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 She has been followed by podiatry for this and I am not entirely clear as to how she came to be referred here. She resides in an assisted living facility. It is not clear what they have been putting on her wound, but on intake, she was noted to have denuded skin on her medial third toe, as well as ulcers on her dorsal great toe and lateral great toe. There is slough accumulation on both of the toe ulcers. There was an odor noted at intake, but after her foot was washed, the odor dissipated. Her toes are folded on top of each other creating areas of abrasion and pockets for moisture collection, which seems to be the primary cause of her ulceration. 06/20/2022: The skin between her toes and on the ball of her foot is  completely macerated. There has been more tissue breakdown. The wound on her great toe has some slough accumulation. 06/27/2022: No change to her wounds today. There has been no further deterioration, but no significant improvement. She was both hypotensive and bradycardic on intake. SEMAYA, BENNINGER ([id number]) [id number] Page 11 of 17 07/11/2022: Today, her foot is completely macerated. She reports that the wound care nurse actually soaked her foot and then applied foam, despite our specific orders

## 2023-01-10 ENCOUNTER — Ambulatory Visit (HOSPITAL_BASED_OUTPATIENT_CLINIC_OR_DEPARTMENT_OTHER): Payer: Medicare Other | Admitting: General Surgery

## 2023-01-16 ENCOUNTER — Encounter (HOSPITAL_BASED_OUTPATIENT_CLINIC_OR_DEPARTMENT_OTHER): Payer: Medicare Other | Admitting: General Surgery

## 2023-01-16 DIAGNOSIS — L97522 Non-pressure chronic ulcer of other part of left foot with fat layer exposed: Secondary | ICD-10-CM | POA: Diagnosis not present

## 2023-01-16 NOTE — Progress Notes (Signed)
or cotton balls. Peri-Wound Care: Sween Lotion (Moisturizing lotion) 1 x Per Week/30 Days Discharge Instructions: Apply moisturizing lotion as directed [name], [name] ([id number]) [id number] Page 15 of 17 Topical: Gentamicin 1 x Per Week/30 Days Discharge Instructions: As directed by physician Topical: Mupirocin Ointment 1 x Per Week/30 Days Discharge Instructions: Apply Mupirocin (Bactroban) as instructed Topical: Ketoconazole Cream 2% 1 x Per Week/30 Days Discharge Instructions: Apply Ketoconazole as directed Topical: zinc 1 x Per Week/30 Days Prim Dressing: Endoform 2x2 in 1 x Per Week/30 Days ary Discharge Instructions: Moisten with saline Secondary Dressing: Drawtex 4x4 in 1 x Per Week/30 Days Discharge Instructions: Apply over primary dressing as directed. Secondary Dressing: Woven Gauze Sponge, Non-Sterile 4x4 in 1 x Per Week/30 Days Discharge  Instructions: can use gauze between toes (if no desired) Secondary Dressing: Zetuvit Plus 4x8 in 1 x Per Week/30 Days Discharge Instructions: Apply over primary dressing as directed. Com pression Wrap: Urgo K2 Lite, (equivalent to a 3 layer) two layer compression system, regular 1 x Per Week/30 Days Discharge Instructions: Apply Urgo K2 Lite as directed (alternative to 3 layer compression). 01/16/2023: Her wounds are all about the same. The dressings were on for almost 2 weeks and were little bit soupy when removed. I used a curette to debride slough from all of the wounds. We will apply endoform to the dorsal great toe, dorsal second toe, and plantar fourth toe, where bone remains exposed. We will apply silver alginate to the other sites. We will continue the mixture of topical gentamicin and mupirocin to all sites. I am going to subtract the triamcinolone from the mixture we are applying to the periwound skin and just use ketoconazole and zinc oxide to see if this helps with the moisture accumulation that seems to plague [place]. Continue Urgo light compression wrap. Follow-up in 1 week. Electronic Signature(s) Signed: 01/16/2023 9:15:58 AM By: [name] MD FACS Entered By: [name] on 01/16/2023 06:15:58 -------------------------------------------------------------------------------- HxROS Details Patient Name: Date of Service: [place] [name], [facility] RO N 01/16/2023 8:30 A M Medical Record Number: [id number] Patient Account Number: [id number] Date of Birth/Sex: Treating RN: 1940-06-22 (82 y.o. 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 Neurologic [name], [name] ([id number]) [id number] Page 16 of 17 Medical History: Past Medical History Notes: stroke Immunizations Pneumococcal Vaccine: Received Pneumococcal Vaccination: Yes Received Pneumococcal Vaccination On or After 60th Birthday: Yes Implantable Devices None Hospitalization / Surgery History Type of Hospitalization/Surgery cholecystectomy leg surgery tonsillectomy Family and Social History Unknown History: Yes; Former smoker - smoked when she was young; Marital Status - Widowed; Alcohol Use: Never; Drug Use: No History; Caffeine Use: Daily; Financial Concerns: No; Food, Clothing or Shelter Needs: No; Support System Lacking: No; Transportation Concerns: No Electronic Signature(s) Signed: 01/16/2023 9:42:08 AM By: [name] MD FACS Entered By: [name] on 01/16/2023 06:13:04 -------------------------------------------------------------------------------- SuperBill Details Patient Name: Date of Service: [name], [facility] RO N 01/16/2023 Medical Record Number: [id number] Patient Account Number: [id number] Date of Birth/Sex: Treating RN: 1941/03/16 (82 y.o. F) Primary Care Provider: Richrd Joseph, [facility] Other Clinician: Referring Provider: Treating Provider/Extender: [name], [facility]  dorsal foot areas continue to contract. The lateral foot ulcer has some tendon exposure, but there is no longer any necrotic muscle or other nonviable soft tissue. The medial  great toe ulcer is about the same with bone exposure. The dorsal second toe ulcer also has bone exposure. She has a new wound on her right anterior tibial surface. The fat layer is exposed but it is fairly small. It is clean without any slough or eschar accumulation. 12/27/2022: I have not seen this patient in 3 weeks; she had a nurse visit 1 week after the last time I saw her and then has been unable to attend the subsequent visits for various reasons. She did undergo angiography on August 23. 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 There were no options for revascularization. Her wraps were on for 2 weeks and were fairly soupy when they were removed. The plantar fourth toe wound has reopened with bone exposure. The rest of the wounds are all about the same. 01/04/2023: No significant change to any of the wounds. 01/16/2023: Her wounds are all about the same. The dressings were on for almost 2 weeks and were little bit soupy when removed. Electronic Signature(s) Signed: 01/16/2023 9:12:29 AM By: [name] MD FACS [place], [name] ([id number]) By: [name] MD FACS [phone].pdf Page 6 of 17 Signed: 01/16/2023 9:12:29 AM Entered By: [name] on 01/16/2023 06:12:29 -------------------------------------------------------------------------------- Physical Exam Details Patient Name: Date of Service: [name] 01/16/2023 8:30 A M Medical Record Number: [id number] Patient Account Number: [id number] Date of Birth/Sex: Treating RN: 06/14/1940 (82 y.o. F) Primary Care Provider: Richrd Joseph, [facility] Other Clinician: Referring Provider: Treating Provider/Extender: [name] SHO KES, [facility] Weeks in Treatment: 31 Constitutional . . . . no acute  distress. Respiratory Normal work of breathing on room air. Notes 01/16/2023: Her wounds are all about the same. The dressings were on for almost 2 weeks and were little bit soupy when removed. Electronic Signature(s) Signed: 01/16/2023 9:13:28 AM By: [name] MD FACS Entered By: [name] on 01/16/2023 02:72:53 -------------------------------------------------------------------------------- Physician Orders Details Patient Name: Date of Service: [name], [facility] RO N 01/16/2023 8:30 A M Medical Record Number: [id number] Patient Account Number: [id number] Date of Birth/Sex: Treating RN: Feb 18, 1941 (82 y.o. 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 - Dr. Lady Gary - room 2 Return A Anesthetic Kirsten, Mason ([id number]) [id number] Page 7 of 17 (In clinic) Topical Lidocaine 4% applied to wound bed - USED in  Clinic Bathing/ Shower/  [name], [name] ([id number]) [id number] Page 1 of 17 Visit Report for 01/16/2023 Chief Complaint Document Details Patient Name: Date of Service: [name] RO N 01/16/2023 8:30 A M Medical Record Number: [id number] Patient Account Number: [id number] Date of Birth/Sex: Treating RN: [birth year] (82 y.o. F) Primary Care Provider: Richrd Joseph, [facility] Other Clinician: Referring Provider: Treating Provider/Extender: [name], [facility] Weeks in Treatment: 73 Information Obtained from: Patient Chief Complaint Patient seen for complaints of Non-Healing Wound. Electronic Signature(s) Signed: 01/16/2023 9:11:36 AM By: [name] MD FACS Entered By: [name] on 01/16/2023 06:11:36 -------------------------------------------------------------------------------- Debridement Details Patient Name: Date of Service: [name], [facility] RO N 01/16/2023 8:30 A M Medical Record Number: [id number] Patient Account Number: [id number] Date of Birth/Sex: Treating RN: Jun 20, 1940 (82 y.o. [name] Primary Care Provider: Richrd Joseph, [facility] Other Clinician: Referring Provider: Treating Provider/Extender: [name], [facility] Weeks in Treatment: 31 Debridement Performed for Assessment: Wound #10 Left T Second oe Performed By: Physician [name], MD The following information was scribed by: [name] The information was scribed for: [name] Debridement Type: Debridement Level of Consciousness (Pre-procedure): Awake and Alert Pre-procedure Verification/Time Out Yes - 08:54 Taken: Start Time: 08:54 Pain Control: Lidocaine 4% T opical Solution Percent of Wound Bed Debrided: 100% T Area Debrided (cm): otal 0.27 Tissue and other material debrided: Non-Viable, Slough, Slough Level: Non-Viable Tissue Debridement Description: Selective/Open Wound Instrument: Curette Bleeding: Minimum Hemostasis Achieved:  Pressure Response to Treatment: Procedure was tolerated well Level of Consciousness (Post- Awake and Alert procedure): Post Debridement Measurements of Total Wound Length: (cm) 0.7 Width: (cm) 0.5 Depth: (cm) 0.1 Volume: (cm) 0.027 Character of Wound/Ulcer Post Debridement: Improved Post Procedure Diagnosis [name], [name] ([id number]) [id number] Page 2 of 17 Same as Pre-procedure Electronic Signature(s) Signed: 01/16/2023 9:42:08 AM By: [name] MD FACS Signed: 01/16/2023 4:28:05 PM By: [name] Entered By: [name] on 01/16/2023 05:55:05 -------------------------------------------------------------------------------- Debridement Details Patient Name: Date of Service: [name], [facility] RO N 01/16/2023 8:30 A M Medical Record Number: [id number] Patient Account Number: [id number] Date of Birth/Sex: Treating RN: [birth year] (82 y.o. [name] Primary Care Provider: Richrd Joseph, [facility] Other Clinician: Referring Provider: Treating Provider/Extender: [name], [facility] Weeks in Treatment: 31 Debridement Performed for Assessment: Wound #6 Left,Dorsal Foot Performed By: Physician [name], MD The following information was scribed by: [name] The information was scribed for: [name] Debridement Type: Debridement Level of Consciousness (Pre-procedure): Awake and Alert Pre-procedure Verification/Time Out Yes - 08:54 Taken: Start Time: 08:54 Pain Control: Lidocaine 4% T opical Solution Percent of Wound Bed Debrided: 100% T Area Debrided (cm): otal 1.41 Tissue and other material debrided: Non-Viable, Slough, Slough Level: Non-Viable Tissue Debridement Description: Selective/Open Wound Instrument: Curette Bleeding: Minimum Hemostasis Achieved: Pressure Response to Treatment: Procedure was tolerated well Level of Consciousness (Post- Awake and Alert procedure): Post Debridement  Measurements of Total Wound Length: (cm) 1.5 Width: (cm) 1.2 Depth: (cm) 0.1 Volume: (cm) 0.141 Character of Wound/Ulcer Post Debridement: Improved Post Procedure Diagnosis Same as Pre-procedure Electronic Signature(s) Signed: 01/16/2023 9:42:08 AM By: [name] MD FACS Signed: 01/16/2023 4:28:05 PM By: [name] Entered By: [name] on 01/16/2023 05:55:24 -------------------------------------------------------------------------------- Debridement Details Patient Name: Date of Service: [name], [facility] RO N 01/16/2023 8:30 A M Medical Record Number: [id number] Patient Account Number: [id number] Date of Birth/Sex: Treating RN: 1941-02-09 (82 y.o. [name] Primary Care Provider: Richrd Joseph, [facility] Other Clinician: Marsh Dolly ([id number]) [id number] Page 3 of 17 Referring Provider: Treating  dorsal foot areas continue to contract. The lateral foot ulcer has some tendon exposure, but there is no longer any necrotic muscle or other nonviable soft tissue. The medial  great toe ulcer is about the same with bone exposure. The dorsal second toe ulcer also has bone exposure. She has a new wound on her right anterior tibial surface. The fat layer is exposed but it is fairly small. It is clean without any slough or eschar accumulation. 12/27/2022: I have not seen this patient in 3 weeks; she had a nurse visit 1 week after the last time I saw her and then has been unable to attend the subsequent visits for various reasons. She did undergo angiography on August 23. 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 There were no options for revascularization. Her wraps were on for 2 weeks and were fairly soupy when they were removed. The plantar fourth toe wound has reopened with bone exposure. The rest of the wounds are all about the same. 01/04/2023: No significant change to any of the wounds. 01/16/2023: Her wounds are all about the same. The dressings were on for almost 2 weeks and were little bit soupy when removed. Electronic Signature(s) Signed: 01/16/2023 9:12:29 AM By: [name] MD FACS [place], [name] ([id number]) By: [name] MD FACS [phone].pdf Page 6 of 17 Signed: 01/16/2023 9:12:29 AM Entered By: [name] on 01/16/2023 06:12:29 -------------------------------------------------------------------------------- Physical Exam Details Patient Name: Date of Service: [name] 01/16/2023 8:30 A M Medical Record Number: [id number] Patient Account Number: [id number] Date of Birth/Sex: Treating RN: 06/14/1940 (82 y.o. F) Primary Care Provider: Richrd Joseph, [facility] Other Clinician: Referring Provider: Treating Provider/Extender: [name] SHO KES, [facility] Weeks in Treatment: 31 Constitutional . . . . no acute  distress. Respiratory Normal work of breathing on room air. Notes 01/16/2023: Her wounds are all about the same. The dressings were on for almost 2 weeks and were little bit soupy when removed. Electronic Signature(s) Signed: 01/16/2023 9:13:28 AM By: [name] MD FACS Entered By: [name] on 01/16/2023 02:72:53 -------------------------------------------------------------------------------- Physician Orders Details Patient Name: Date of Service: [name], [facility] RO N 01/16/2023 8:30 A M Medical Record Number: [id number] Patient Account Number: [id number] Date of Birth/Sex: Treating RN: Feb 18, 1941 (82 y.o. 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 - Dr. Lady Gary - room 2 Return A Anesthetic Kirsten, [place] ([id number]) [id number] Page 7 of 17 (In clinic) Topical Lidocaine 4% applied to wound bed - USED in  Clinic Bathing/ Shower/  to 3 layer compression). Wound #8 - Foot Wound Laterality: Left, Lateral Cleanser: Soap and Water 1 x Per Week/30 Days Discharge Instructions: May shower and wash wound with dial antibacterial soap and water prior to dressing change. Cleanser: Wound Cleanser 1 x Per Week/30 Days Discharge Instructions: Cleanse the wound with wound cleanser prior to applying a clean dressing using gauze sponges, not tissue or cotton balls. Peri-Wound Care: Sween Lotion (Moisturizing lotion) 1 x Per Week/30 Days Discharge Instructions: Apply moisturizing lotion as directed Topical: Gentamicin 1 x Per Week/30 Days Discharge Instructions: As directed by physician Topical: Mupirocin Ointment 1 x Per Week/30 Days Discharge Instructions: Apply Mupirocin (Bactroban) as instructed Topical: Ketoconazole Cream 2% 1 x Per Week/30 Days Discharge Instructions: Apply Ketoconazole as directed Topical: zinc 1 x Per Week/30 Days Prim Dressing: Endoform 2x2 in 1 x Per Week/30 Days ary Discharge Instructions: Moisten with saline Secondary Dressing: Drawtex 4x4 in 1 x Per Week/30 Days Discharge Instructions: Apply over primary dressing as directed. Secondary Dressing: Woven Gauze Sponge, Non-Sterile 4x4 in 1 x Per Week/30 Days Discharge Instructions: can use gauze between toes (if no desired) Secondary Dressing: Zetuvit Plus 4x8 in 1 x Per Week/30 Days Discharge Instructions: Apply over primary dressing as directed. Compression Wrap: Urgo K2 Lite, (equivalent to a 3 layer) two layer compression system, regular 1 x Per Week/30 Days Discharge Instructions: Apply Urgo K2 Lite as directed (alternative to 3 layer compression). Wound #9 - T Great oe Wound Laterality: Left, Lateral Cleanser: Soap and Water 1 x Per Week/30 Days Discharge Instructions: May shower and wash wound with dial antibacterial soap and water prior to dressing change. Cleanser: Wound  Cleanser 1 x Per Week/30 Days Discharge Instructions: Cleanse the wound with wound cleanser prior to applying a clean dressing using gauze sponges, not tissue or cotton balls. Peri-Wound Care: Sween Lotion (Moisturizing lotion) 1 x Per Week/30 Days Discharge Instructions: Apply moisturizing lotion as directed Topical: Gentamicin 1 x Per Week/30 Days Discharge Instructions: As directed by physician Topical: Mupirocin Ointment 1 x Per Week/30 Days Discharge Instructions: Apply Mupirocin (Bactroban) as instructed Topical: Ketoconazole Cream 2% 1 x Per Week/30 Days Discharge Instructions: Apply Ketoconazole as directed Topical: zinc 1 x Per Week/30 Days Prim Dressing: Endoform 2x2 in 1 x Per Week/30 Days ary Discharge Instructions: Moisten with saline Secondary Dressing: Drawtex 4x4 in 1 x Per Week/30 Days Discharge Instructions: Apply over primary dressing as directed. Secondary Dressing: Woven Gauze Sponge, Non-Sterile 4x4 in 1 x Per Week/30 Days Discharge Instructions: can use gauze between toes (if no desired) Secondary Dressing: Zetuvit Plus 4x8 in 1 x Per Week/30 Days Discharge Instructions: Apply over primary dressing as directed. [name], [name] ([id number]) [id number] Page 9 of 17 Compression Wrap: Urgo K2 Lite, (equivalent to a 3 layer) two layer compression system, regular 1 x Per Week/30 Days Discharge Instructions: Apply Urgo K2 Lite as directed (alternative to 3 layer compression). Patient Medications llergies: Sulfa (Sulfonamide Antibiotics) A Notifications Medication Indication Start End 01/16/2023 lidocaine DOSE topical 4 % cream - cream topical Electronic Signature(s) Signed: 01/16/2023 9:42:08 AM By: [name] MD FACS Entered By: [name] on 01/16/2023 06:13:48 -------------------------------------------------------------------------------- Problem List Details Patient Name: Date of Service: [name], [facility] RO N 01/16/2023  8:30 A M Medical Record Number: [id number] Patient Account Number: [id number] Date of Birth/Sex: Treating RN: [birth year] (82 y.o. F) Primary Care Provider: Richrd Joseph, [facility] Other Clinician: Referring Provider: Treating Provider/Extender: [name], [facility] Weeks in Treatment: 24 Active Problems ICD-10 Encounter Code Description  to 3 layer compression). Wound #8 - Foot Wound Laterality: Left, Lateral Cleanser: Soap and Water 1 x Per Week/30 Days Discharge Instructions: May shower and wash wound with dial antibacterial soap and water prior to dressing change. Cleanser: Wound Cleanser 1 x Per Week/30 Days Discharge Instructions: Cleanse the wound with wound cleanser prior to applying a clean dressing using gauze sponges, not tissue or cotton balls. Peri-Wound Care: Sween Lotion (Moisturizing lotion) 1 x Per Week/30 Days Discharge Instructions: Apply moisturizing lotion as directed Topical: Gentamicin 1 x Per Week/30 Days Discharge Instructions: As directed by physician Topical: Mupirocin Ointment 1 x Per Week/30 Days Discharge Instructions: Apply Mupirocin (Bactroban) as instructed Topical: Ketoconazole Cream 2% 1 x Per Week/30 Days Discharge Instructions: Apply Ketoconazole as directed Topical: zinc 1 x Per Week/30 Days Prim Dressing: Endoform 2x2 in 1 x Per Week/30 Days ary Discharge Instructions: Moisten with saline Secondary Dressing: Drawtex 4x4 in 1 x Per Week/30 Days Discharge Instructions: Apply over primary dressing as directed. Secondary Dressing: Woven Gauze Sponge, Non-Sterile 4x4 in 1 x Per Week/30 Days Discharge Instructions: can use gauze between toes (if no desired) Secondary Dressing: Zetuvit Plus 4x8 in 1 x Per Week/30 Days Discharge Instructions: Apply over primary dressing as directed. Compression Wrap: Urgo K2 Lite, (equivalent to a 3 layer) two layer compression system, regular 1 x Per Week/30 Days Discharge Instructions: Apply Urgo K2 Lite as directed (alternative to 3 layer compression). Wound #9 - T Great oe Wound Laterality: Left, Lateral Cleanser: Soap and Water 1 x Per Week/30 Days Discharge Instructions: May shower and wash wound with dial antibacterial soap and water prior to dressing change. Cleanser: Wound  Cleanser 1 x Per Week/30 Days Discharge Instructions: Cleanse the wound with wound cleanser prior to applying a clean dressing using gauze sponges, not tissue or cotton balls. Peri-Wound Care: Sween Lotion (Moisturizing lotion) 1 x Per Week/30 Days Discharge Instructions: Apply moisturizing lotion as directed Topical: Gentamicin 1 x Per Week/30 Days Discharge Instructions: As directed by physician Topical: Mupirocin Ointment 1 x Per Week/30 Days Discharge Instructions: Apply Mupirocin (Bactroban) as instructed Topical: Ketoconazole Cream 2% 1 x Per Week/30 Days Discharge Instructions: Apply Ketoconazole as directed Topical: zinc 1 x Per Week/30 Days Prim Dressing: Endoform 2x2 in 1 x Per Week/30 Days ary Discharge Instructions: Moisten with saline Secondary Dressing: Drawtex 4x4 in 1 x Per Week/30 Days Discharge Instructions: Apply over primary dressing as directed. Secondary Dressing: Woven Gauze Sponge, Non-Sterile 4x4 in 1 x Per Week/30 Days Discharge Instructions: can use gauze between toes (if no desired) Secondary Dressing: Zetuvit Plus 4x8 in 1 x Per Week/30 Days Discharge Instructions: Apply over primary dressing as directed. [name], [name] ([id number]) [id number] Page 9 of 17 Compression Wrap: Urgo K2 Lite, (equivalent to a 3 layer) two layer compression system, regular 1 x Per Week/30 Days Discharge Instructions: Apply Urgo K2 Lite as directed (alternative to 3 layer compression). Patient Medications llergies: Sulfa (Sulfonamide Antibiotics) A Notifications Medication Indication Start End 01/16/2023 lidocaine DOSE topical 4 % cream - cream topical Electronic Signature(s) Signed: 01/16/2023 9:42:08 AM By: [name] MD FACS Entered By: [name] on 01/16/2023 06:13:48 -------------------------------------------------------------------------------- Problem List Details Patient Name: Date of Service: [name], [facility] RO N 01/16/2023  8:30 A M Medical Record Number: [id number] Patient Account Number: [id number] Date of Birth/Sex: Treating RN: [birth year] (82 y.o. F) Primary Care Provider: Richrd Joseph, [facility] Other Clinician: Referring Provider: Treating Provider/Extender: [name], [facility] Weeks in Treatment: 24 Active Problems ICD-10 Encounter Code Description  to 3 layer compression). Wound #8 - Foot Wound Laterality: Left, Lateral Cleanser: Soap and Water 1 x Per Week/30 Days Discharge Instructions: May shower and wash wound with dial antibacterial soap and water prior to dressing change. Cleanser: Wound Cleanser 1 x Per Week/30 Days Discharge Instructions: Cleanse the wound with wound cleanser prior to applying a clean dressing using gauze sponges, not tissue or cotton balls. Peri-Wound Care: Sween Lotion (Moisturizing lotion) 1 x Per Week/30 Days Discharge Instructions: Apply moisturizing lotion as directed Topical: Gentamicin 1 x Per Week/30 Days Discharge Instructions: As directed by physician Topical: Mupirocin Ointment 1 x Per Week/30 Days Discharge Instructions: Apply Mupirocin (Bactroban) as instructed Topical: Ketoconazole Cream 2% 1 x Per Week/30 Days Discharge Instructions: Apply Ketoconazole as directed Topical: zinc 1 x Per Week/30 Days Prim Dressing: Endoform 2x2 in 1 x Per Week/30 Days ary Discharge Instructions: Moisten with saline Secondary Dressing: Drawtex 4x4 in 1 x Per Week/30 Days Discharge Instructions: Apply over primary dressing as directed. Secondary Dressing: Woven Gauze Sponge, Non-Sterile 4x4 in 1 x Per Week/30 Days Discharge Instructions: can use gauze between toes (if no desired) Secondary Dressing: Zetuvit Plus 4x8 in 1 x Per Week/30 Days Discharge Instructions: Apply over primary dressing as directed. Compression Wrap: Urgo K2 Lite, (equivalent to a 3 layer) two layer compression system, regular 1 x Per Week/30 Days Discharge Instructions: Apply Urgo K2 Lite as directed (alternative to 3 layer compression). Wound #9 - T Great oe Wound Laterality: Left, Lateral Cleanser: Soap and Water 1 x Per Week/30 Days Discharge Instructions: May shower and wash wound with dial antibacterial soap and water prior to dressing change. Cleanser: Wound  Cleanser 1 x Per Week/30 Days Discharge Instructions: Cleanse the wound with wound cleanser prior to applying a clean dressing using gauze sponges, not tissue or cotton balls. Peri-Wound Care: Sween Lotion (Moisturizing lotion) 1 x Per Week/30 Days Discharge Instructions: Apply moisturizing lotion as directed Topical: Gentamicin 1 x Per Week/30 Days Discharge Instructions: As directed by physician Topical: Mupirocin Ointment 1 x Per Week/30 Days Discharge Instructions: Apply Mupirocin (Bactroban) as instructed Topical: Ketoconazole Cream 2% 1 x Per Week/30 Days Discharge Instructions: Apply Ketoconazole as directed Topical: zinc 1 x Per Week/30 Days Prim Dressing: Endoform 2x2 in 1 x Per Week/30 Days ary Discharge Instructions: Moisten with saline Secondary Dressing: Drawtex 4x4 in 1 x Per Week/30 Days Discharge Instructions: Apply over primary dressing as directed. Secondary Dressing: Woven Gauze Sponge, Non-Sterile 4x4 in 1 x Per Week/30 Days Discharge Instructions: can use gauze between toes (if no desired) Secondary Dressing: Zetuvit Plus 4x8 in 1 x Per Week/30 Days Discharge Instructions: Apply over primary dressing as directed. [name], [name] ([id number]) [id number] Page 9 of 17 Compression Wrap: Urgo K2 Lite, (equivalent to a 3 layer) two layer compression system, regular 1 x Per Week/30 Days Discharge Instructions: Apply Urgo K2 Lite as directed (alternative to 3 layer compression). Patient Medications llergies: Sulfa (Sulfonamide Antibiotics) A Notifications Medication Indication Start End 01/16/2023 lidocaine DOSE topical 4 % cream - cream topical Electronic Signature(s) Signed: 01/16/2023 9:42:08 AM By: [name] MD FACS Entered By: [name] on 01/16/2023 06:13:48 -------------------------------------------------------------------------------- Problem List Details Patient Name: Date of Service: [name], [facility] RO N 01/16/2023  8:30 A M Medical Record Number: [id number] Patient Account Number: [id number] Date of Birth/Sex: Treating RN: [birth year] (82 y.o. F) Primary Care Provider: Richrd Joseph, [facility] Other Clinician: Referring Provider: Treating Provider/Extender: [name], [facility] Weeks in Treatment: 24 Active Problems ICD-10 Encounter Code Description  Provider/Extender: [name] SHO [name], [facility] Weeks in Treatment: 31 Debridement Performed for Assessment: Wound #8 Left,Lateral Foot Performed By: Physician [name], MD Debridement Type: Debridement Level of Consciousness (Pre-procedure): Awake and Alert Pre-procedure Verification/Time Out Yes - 08:54 Taken: Start Time: 08:54 Pain Control: Lidocaine 4% T opical Solution Percent of Wound Bed Debrided: 100% T Area Debrided (cm): otal 0.86 Tissue and other material debrided: Non-Viable, Slough, Slough Level: Non-Viable Tissue Debridement Description: Selective/Open Wound Instrument: Curette Bleeding: Minimum Hemostasis Achieved: Pressure Response to Treatment: Procedure was tolerated well Level of Consciousness (Post- Awake and Alert procedure): Post Debridement Measurements of Total Wound Length: (cm) 1 Stage: Category/Stage IV Width: (cm) 1.1 Depth: (cm) 0.2 Volume: (cm) 0.173 Character of Wound/Ulcer Post Debridement: Improved Post Procedure Diagnosis Same as Pre-procedure Electronic  Signature(s) Signed: 01/16/2023 9:42:08 AM By: [name] MD FACS Signed: 01/16/2023 4:28:05 PM By: [name] Entered By: [name] on 01/16/2023 05:56:11 -------------------------------------------------------------------------------- Debridement Details Patient Name: Date of Service: [name], [facility] RO N 01/16/2023 8:30 A M Medical Record Number: [id number] Patient Account Number: [id number] Date of Birth/Sex: Treating RN: 1941/03/27 (82 y.o. [name] Primary Care Provider: Richrd Joseph, [facility] Other Clinician: Referring Provider: Treating Provider/Extender: [name], [facility] Weeks in Treatment: 31 Debridement Performed for Assessment: Wound #9 Left,Lateral T Great oe Performed By: Physician [name], MD The following information was scribed by: [name] The information was scribed for: [name] Debridement Type: Debridement Level of Consciousness (Pre-procedure): Awake and Alert Pre-procedure Verification/Time Out Yes - 08:54 Taken: Start Time: 08:54 Pain Control: Lidocaine 4% T opical Solution Percent of Wound Bed Debrided: 100% T Area Debrided (cm): otal 0.05 Tissue and other material debrided: Non-Viable, Slough, Slough Level: Non-Viable Tissue Debridement Description: Selective/Open Wound Instrument: Curette Bleeding: Minimum Hemostasis Achieved: Pressure Response to Treatment: Procedure was tolerated well Level of Consciousness (Post- Awake and Alert procedure): [name], [name] ([id number]) [id number] Page 4 of 17 Post Debridement Measurements of Total Wound Length: (cm) 0.2 Width: (cm) 0.3 Depth: (cm) 0.1 Volume: (cm) 0.005 Character of Wound/Ulcer Post Debridement: Improved Post Procedure Diagnosis Same as Pre-procedure Electronic Signature(s) Signed: 01/16/2023 9:42:08 AM By: [name] MD FACS Signed: 01/16/2023 4:28:05 PM By: [name] Entered By:  [name] on 01/16/2023 05:56:29 -------------------------------------------------------------------------------- HPI Details Patient Name: Date of Service: [name], [facility] RO N 01/16/2023 8:30 A M Medical Record Number: [id number] Patient Account Number: [id number] Date of Birth/Sex: Treating RN: May 29, 1940 (82 y.o. F) Primary Care Provider: Richrd Joseph, [facility] Other Clinician: Referring Provider: Treating Provider/Extender: [name], [facility] Weeks in Treatment: 35 History of Present Illness HPI Description: ADMISSION 06/12/2022 This is an 82 year old woman with a history of CVA, crest syndrome, atrial fibrillation, rocker-bottom foot deformity. 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 She has been followed by podiatry for this and I am not entirely clear as to how she came to be referred here. She resides in an assisted living facility. It is not clear what they have been putting on her wound, but on intake, she was noted to have denuded skin on her medial third toe, as well as ulcers on her dorsal great toe and lateral great toe. There is slough accumulation on both of the toe ulcers. There was an odor noted at intake, but after her foot was washed, the odor dissipated. Her toes are folded on top of each other creating areas of abrasion and pockets for moisture collection, which seems to be  or cotton balls. Peri-Wound Care: Sween Lotion (Moisturizing lotion) 1 x Per Week/30 Days Discharge Instructions: Apply moisturizing lotion as directed [name], [name] ([id number]) [id number] Page 15 of 17 Topical: Gentamicin 1 x Per Week/30 Days Discharge Instructions: As directed by physician Topical: Mupirocin Ointment 1 x Per Week/30 Days Discharge Instructions: Apply Mupirocin (Bactroban) as instructed Topical: Ketoconazole Cream 2% 1 x Per Week/30 Days Discharge Instructions: Apply Ketoconazole as directed Topical: zinc 1 x Per Week/30 Days Prim Dressing: Endoform 2x2 in 1 x Per Week/30 Days ary Discharge Instructions: Moisten with saline Secondary Dressing: Drawtex 4x4 in 1 x Per Week/30 Days Discharge Instructions: Apply over primary dressing as directed. Secondary Dressing: Woven Gauze Sponge, Non-Sterile 4x4 in 1 x Per Week/30 Days Discharge  Instructions: can use gauze between toes (if no desired) Secondary Dressing: Zetuvit Plus 4x8 in 1 x Per Week/30 Days Discharge Instructions: Apply over primary dressing as directed. Com pression Wrap: Urgo K2 Lite, (equivalent to a 3 layer) two layer compression system, regular 1 x Per Week/30 Days Discharge Instructions: Apply Urgo K2 Lite as directed (alternative to 3 layer compression). 01/16/2023: Her wounds are all about the same. The dressings were on for almost 2 weeks and were little bit soupy when removed. I used a curette to debride slough from all of the wounds. We will apply endoform to the dorsal great toe, dorsal second toe, and plantar fourth toe, where bone remains exposed. We will apply silver alginate to the other sites. We will continue the mixture of topical gentamicin and mupirocin to all sites. I am going to subtract the triamcinolone from the mixture we are applying to the periwound skin and just use ketoconazole and zinc oxide to see if this helps with the moisture accumulation that seems to plague [place]. Continue Urgo light compression wrap. Follow-up in 1 week. Electronic Signature(s) Signed: 01/16/2023 9:15:58 AM By: [name] MD FACS Entered By: [name] on 01/16/2023 06:15:58 -------------------------------------------------------------------------------- HxROS Details Patient Name: Date of Service: [place] [name], [facility] RO N 01/16/2023 8:30 A M Medical Record Number: [id number] Patient Account Number: [id number] Date of Birth/Sex: Treating RN: 1940-06-22 (82 y.o. 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 Neurologic [name], [name] ([id number]) [id number] Page 16 of 17 Medical History: Past Medical History Notes: stroke Immunizations Pneumococcal Vaccine: Received Pneumococcal Vaccination: Yes Received Pneumococcal Vaccination On or After 60th Birthday: Yes Implantable Devices None Hospitalization / Surgery History Type of Hospitalization/Surgery cholecystectomy leg surgery tonsillectomy Family and Social History Unknown History: Yes; Former smoker - smoked when she was young; Marital Status - Widowed; Alcohol Use: Never; Drug Use: No History; Caffeine Use: Daily; Financial Concerns: No; Food, Clothing or Shelter Needs: No; Support System Lacking: No; Transportation Concerns: No Electronic Signature(s) Signed: 01/16/2023 9:42:08 AM By: [name] MD FACS Entered By: [name] on 01/16/2023 06:13:04 -------------------------------------------------------------------------------- SuperBill Details Patient Name: Date of Service: [name], [facility] RO N 01/16/2023 Medical Record Number: [id number] Patient Account Number: [id number] Date of Birth/Sex: Treating RN: 1941/03/16 (82 y.o. F) Primary Care Provider: Richrd Joseph, [facility] Other Clinician: Referring Provider: Treating Provider/Extender: [name], [facility]  [name], [name] ([id number]) [id number] Page 1 of 17 Visit Report for 01/16/2023 Chief Complaint Document Details Patient Name: Date of Service: [name] RO N 01/16/2023 8:30 A M Medical Record Number: [id number] Patient Account Number: [id number] Date of Birth/Sex: Treating RN: [birth year] (82 y.o. F) Primary Care Provider: Richrd Joseph, [facility] Other Clinician: Referring Provider: Treating Provider/Extender: [name], [facility] Weeks in Treatment: 73 Information Obtained from: Patient Chief Complaint Patient seen for complaints of Non-Healing Wound. Electronic Signature(s) Signed: 01/16/2023 9:11:36 AM By: [name] MD FACS Entered By: [name] on 01/16/2023 06:11:36 -------------------------------------------------------------------------------- Debridement Details Patient Name: Date of Service: [name], [facility] RO N 01/16/2023 8:30 A M Medical Record Number: [id number] Patient Account Number: [id number] Date of Birth/Sex: Treating RN: Jun 20, 1940 (82 y.o. [name] Primary Care Provider: Richrd Joseph, [facility] Other Clinician: Referring Provider: Treating Provider/Extender: [name], [facility] Weeks in Treatment: 31 Debridement Performed for Assessment: Wound #10 Left T Second oe Performed By: Physician [name], MD The following information was scribed by: [name] The information was scribed for: [name] Debridement Type: Debridement Level of Consciousness (Pre-procedure): Awake and Alert Pre-procedure Verification/Time Out Yes - 08:54 Taken: Start Time: 08:54 Pain Control: Lidocaine 4% T opical Solution Percent of Wound Bed Debrided: 100% T Area Debrided (cm): otal 0.27 Tissue and other material debrided: Non-Viable, Slough, Slough Level: Non-Viable Tissue Debridement Description: Selective/Open Wound Instrument: Curette Bleeding: Minimum Hemostasis Achieved:  Pressure Response to Treatment: Procedure was tolerated well Level of Consciousness (Post- Awake and Alert procedure): Post Debridement Measurements of Total Wound Length: (cm) 0.7 Width: (cm) 0.5 Depth: (cm) 0.1 Volume: (cm) 0.027 Character of Wound/Ulcer Post Debridement: Improved Post Procedure Diagnosis [name], [name] ([id number]) [id number] Page 2 of 17 Same as Pre-procedure Electronic Signature(s) Signed: 01/16/2023 9:42:08 AM By: [name] MD FACS Signed: 01/16/2023 4:28:05 PM By: [name] Entered By: [name] on 01/16/2023 05:55:05 -------------------------------------------------------------------------------- Debridement Details Patient Name: Date of Service: [name], [facility] RO N 01/16/2023 8:30 A M Medical Record Number: [id number] Patient Account Number: [id number] Date of Birth/Sex: Treating RN: [birth year] (82 y.o. [name] Primary Care Provider: Richrd Joseph, [facility] Other Clinician: Referring Provider: Treating Provider/Extender: [name], [facility] Weeks in Treatment: 31 Debridement Performed for Assessment: Wound #6 Left,Dorsal Foot Performed By: Physician [name], MD The following information was scribed by: [name] The information was scribed for: [name] Debridement Type: Debridement Level of Consciousness (Pre-procedure): Awake and Alert Pre-procedure Verification/Time Out Yes - 08:54 Taken: Start Time: 08:54 Pain Control: Lidocaine 4% T opical Solution Percent of Wound Bed Debrided: 100% T Area Debrided (cm): otal 1.41 Tissue and other material debrided: Non-Viable, Slough, Slough Level: Non-Viable Tissue Debridement Description: Selective/Open Wound Instrument: Curette Bleeding: Minimum Hemostasis Achieved: Pressure Response to Treatment: Procedure was tolerated well Level of Consciousness (Post- Awake and Alert procedure): Post Debridement  Measurements of Total Wound Length: (cm) 1.5 Width: (cm) 1.2 Depth: (cm) 0.1 Volume: (cm) 0.141 Character of Wound/Ulcer Post Debridement: Improved Post Procedure Diagnosis Same as Pre-procedure Electronic Signature(s) Signed: 01/16/2023 9:42:08 AM By: [name] MD FACS Signed: 01/16/2023 4:28:05 PM By: [name] Entered By: [name] on 01/16/2023 05:55:24 -------------------------------------------------------------------------------- Debridement Details Patient Name: Date of Service: [name], [facility] RO N 01/16/2023 8:30 A M Medical Record Number: [id number] Patient Account Number: [id number] Date of Birth/Sex: Treating RN: 1941-02-09 (82 y.o. [name] Primary Care Provider: Richrd Joseph, [facility] Other Clinician: Marsh Dolly ([id number]) [id number] Page 3 of 17 Referring Provider: Treating  Provider/Extender: [name] SHO [name], [facility] Weeks in Treatment: 31 Debridement Performed for Assessment: Wound #8 Left,Lateral Foot Performed By: Physician [name], MD Debridement Type: Debridement Level of Consciousness (Pre-procedure): Awake and Alert Pre-procedure Verification/Time Out Yes - 08:54 Taken: Start Time: 08:54 Pain Control: Lidocaine 4% T opical Solution Percent of Wound Bed Debrided: 100% T Area Debrided (cm): otal 0.86 Tissue and other material debrided: Non-Viable, Slough, Slough Level: Non-Viable Tissue Debridement Description: Selective/Open Wound Instrument: Curette Bleeding: Minimum Hemostasis Achieved: Pressure Response to Treatment: Procedure was tolerated well Level of Consciousness (Post- Awake and Alert procedure): Post Debridement Measurements of Total Wound Length: (cm) 1 Stage: Category/Stage IV Width: (cm) 1.1 Depth: (cm) 0.2 Volume: (cm) 0.173 Character of Wound/Ulcer Post Debridement: Improved Post Procedure Diagnosis Same as Pre-procedure Electronic  Signature(s) Signed: 01/16/2023 9:42:08 AM By: [name] MD FACS Signed: 01/16/2023 4:28:05 PM By: [name] Entered By: [name] on 01/16/2023 05:56:11 -------------------------------------------------------------------------------- Debridement Details Patient Name: Date of Service: [name], [facility] RO N 01/16/2023 8:30 A M Medical Record Number: [id number] Patient Account Number: [id number] Date of Birth/Sex: Treating RN: 1941/03/27 (82 y.o. [name] Primary Care Provider: Richrd Joseph, [facility] Other Clinician: Referring Provider: Treating Provider/Extender: [name], [facility] Weeks in Treatment: 31 Debridement Performed for Assessment: Wound #9 Left,Lateral T Great oe Performed By: Physician [name], MD The following information was scribed by: [name] The information was scribed for: [name] Debridement Type: Debridement Level of Consciousness (Pre-procedure): Awake and Alert Pre-procedure Verification/Time Out Yes - 08:54 Taken: Start Time: 08:54 Pain Control: Lidocaine 4% T opical Solution Percent of Wound Bed Debrided: 100% T Area Debrided (cm): otal 0.05 Tissue and other material debrided: Non-Viable, Slough, Slough Level: Non-Viable Tissue Debridement Description: Selective/Open Wound Instrument: Curette Bleeding: Minimum Hemostasis Achieved: Pressure Response to Treatment: Procedure was tolerated well Level of Consciousness (Post- Awake and Alert procedure): [name], [name] ([id number]) [id number] Page 4 of 17 Post Debridement Measurements of Total Wound Length: (cm) 0.2 Width: (cm) 0.3 Depth: (cm) 0.1 Volume: (cm) 0.005 Character of Wound/Ulcer Post Debridement: Improved Post Procedure Diagnosis Same as Pre-procedure Electronic Signature(s) Signed: 01/16/2023 9:42:08 AM By: [name] MD FACS Signed: 01/16/2023 4:28:05 PM By: [name] Entered By:  [name] on 01/16/2023 05:56:29 -------------------------------------------------------------------------------- HPI Details Patient Name: Date of Service: [name], [facility] RO N 01/16/2023 8:30 A M Medical Record Number: [id number] Patient Account Number: [id number] Date of Birth/Sex: Treating RN: May 29, 1940 (82 y.o. F) Primary Care Provider: Richrd Joseph, [facility] Other Clinician: Referring Provider: Treating Provider/Extender: [name], [facility] Weeks in Treatment: 35 History of Present Illness HPI Description: ADMISSION 06/12/2022 This is an 82 year old woman with a history of CVA, crest syndrome, atrial fibrillation, rocker-bottom foot deformity. 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 She has been followed by podiatry for this and I am not entirely clear as to how she came to be referred here. She resides in an assisted living facility. It is not clear what they have been putting on her wound, but on intake, she was noted to have denuded skin on her medial third toe, as well as ulcers on her dorsal great toe and lateral great toe. There is slough accumulation on both of the toe ulcers. There was an odor noted at intake, but after her foot was washed, the odor dissipated. Her toes are folded on top of each other creating areas of abrasion and pockets for moisture collection, which seems to be  or cotton balls. Peri-Wound Care: Sween Lotion (Moisturizing lotion) 1 x Per Week/30 Days Discharge Instructions: Apply moisturizing lotion as directed [name], [name] ([id number]) [id number] Page 15 of 17 Topical: Gentamicin 1 x Per Week/30 Days Discharge Instructions: As directed by physician Topical: Mupirocin Ointment 1 x Per Week/30 Days Discharge Instructions: Apply Mupirocin (Bactroban) as instructed Topical: Ketoconazole Cream 2% 1 x Per Week/30 Days Discharge Instructions: Apply Ketoconazole as directed Topical: zinc 1 x Per Week/30 Days Prim Dressing: Endoform 2x2 in 1 x Per Week/30 Days ary Discharge Instructions: Moisten with saline Secondary Dressing: Drawtex 4x4 in 1 x Per Week/30 Days Discharge Instructions: Apply over primary dressing as directed. Secondary Dressing: Woven Gauze Sponge, Non-Sterile 4x4 in 1 x Per Week/30 Days Discharge  Instructions: can use gauze between toes (if no desired) Secondary Dressing: Zetuvit Plus 4x8 in 1 x Per Week/30 Days Discharge Instructions: Apply over primary dressing as directed. Com pression Wrap: Urgo K2 Lite, (equivalent to a 3 layer) two layer compression system, regular 1 x Per Week/30 Days Discharge Instructions: Apply Urgo K2 Lite as directed (alternative to 3 layer compression). 01/16/2023: Her wounds are all about the same. The dressings were on for almost 2 weeks and were little bit soupy when removed. I used a curette to debride slough from all of the wounds. We will apply endoform to the dorsal great toe, dorsal second toe, and plantar fourth toe, where bone remains exposed. We will apply silver alginate to the other sites. We will continue the mixture of topical gentamicin and mupirocin to all sites. I am going to subtract the triamcinolone from the mixture we are applying to the periwound skin and just use ketoconazole and zinc oxide to see if this helps with the moisture accumulation that seems to plague [place]. Continue Urgo light compression wrap. Follow-up in 1 week. Electronic Signature(s) Signed: 01/16/2023 9:15:58 AM By: [name] MD FACS Entered By: [name] on 01/16/2023 06:15:58 -------------------------------------------------------------------------------- HxROS Details Patient Name: Date of Service: [place] [name], [facility] RO N 01/16/2023 8:30 A M Medical Record Number: [id number] Patient Account Number: [id number] Date of Birth/Sex: Treating RN: 1940-06-22 (82 y.o. 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 Neurologic [name], [name] ([id number]) [id number] Page 16 of 17 Medical History: Past Medical History Notes: stroke Immunizations Pneumococcal Vaccine: Received Pneumococcal Vaccination: Yes Received Pneumococcal Vaccination On or After 60th Birthday: Yes Implantable Devices None Hospitalization / Surgery History Type of Hospitalization/Surgery cholecystectomy leg surgery tonsillectomy Family and Social History Unknown History: Yes; Former smoker - smoked when she was young; Marital Status - Widowed; Alcohol Use: Never; Drug Use: No History; Caffeine Use: Daily; Financial Concerns: No; Food, Clothing or Shelter Needs: No; Support System Lacking: No; Transportation Concerns: No Electronic Signature(s) Signed: 01/16/2023 9:42:08 AM By: [name] MD FACS Entered By: [name] on 01/16/2023 06:13:04 -------------------------------------------------------------------------------- SuperBill Details Patient Name: Date of Service: [name], [facility] RO N 01/16/2023 Medical Record Number: [id number] Patient Account Number: [id number] Date of Birth/Sex: Treating RN: 1941/03/16 (82 y.o. F) Primary Care Provider: Richrd Joseph, [facility] Other Clinician: Referring Provider: Treating Provider/Extender: [name], [facility]  or cotton balls. Peri-Wound Care: Sween Lotion (Moisturizing lotion) 1 x Per Week/30 Days Discharge Instructions: Apply moisturizing lotion as directed [name], [name] ([id number]) [id number] Page 15 of 17 Topical: Gentamicin 1 x Per Week/30 Days Discharge Instructions: As directed by physician Topical: Mupirocin Ointment 1 x Per Week/30 Days Discharge Instructions: Apply Mupirocin (Bactroban) as instructed Topical: Ketoconazole Cream 2% 1 x Per Week/30 Days Discharge Instructions: Apply Ketoconazole as directed Topical: zinc 1 x Per Week/30 Days Prim Dressing: Endoform 2x2 in 1 x Per Week/30 Days ary Discharge Instructions: Moisten with saline Secondary Dressing: Drawtex 4x4 in 1 x Per Week/30 Days Discharge Instructions: Apply over primary dressing as directed. Secondary Dressing: Woven Gauze Sponge, Non-Sterile 4x4 in 1 x Per Week/30 Days Discharge  Instructions: can use gauze between toes (if no desired) Secondary Dressing: Zetuvit Plus 4x8 in 1 x Per Week/30 Days Discharge Instructions: Apply over primary dressing as directed. Com pression Wrap: Urgo K2 Lite, (equivalent to a 3 layer) two layer compression system, regular 1 x Per Week/30 Days Discharge Instructions: Apply Urgo K2 Lite as directed (alternative to 3 layer compression). 01/16/2023: Her wounds are all about the same. The dressings were on for almost 2 weeks and were little bit soupy when removed. I used a curette to debride slough from all of the wounds. We will apply endoform to the dorsal great toe, dorsal second toe, and plantar fourth toe, where bone remains exposed. We will apply silver alginate to the other sites. We will continue the mixture of topical gentamicin and mupirocin to all sites. I am going to subtract the triamcinolone from the mixture we are applying to the periwound skin and just use ketoconazole and zinc oxide to see if this helps with the moisture accumulation that seems to plague [place]. Continue Urgo light compression wrap. Follow-up in 1 week. Electronic Signature(s) Signed: 01/16/2023 9:15:58 AM By: [name] MD FACS Entered By: [name] on 01/16/2023 06:15:58 -------------------------------------------------------------------------------- HxROS Details Patient Name: Date of Service: [place] [name], [facility] RO N 01/16/2023 8:30 A M Medical Record Number: [id number] Patient Account Number: [id number] Date of Birth/Sex: Treating RN: 1940-06-22 (82 y.o. 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 Neurologic [name], [name] ([id number]) [id number] Page 16 of 17 Medical History: Past Medical History Notes: stroke Immunizations Pneumococcal Vaccine: Received Pneumococcal Vaccination: Yes Received Pneumococcal Vaccination On or After 60th Birthday: Yes Implantable Devices None Hospitalization / Surgery History Type of Hospitalization/Surgery cholecystectomy leg surgery tonsillectomy Family and Social History Unknown History: Yes; Former smoker - smoked when she was young; Marital Status - Widowed; Alcohol Use: Never; Drug Use: No History; Caffeine Use: Daily; Financial Concerns: No; Food, Clothing or Shelter Needs: No; Support System Lacking: No; Transportation Concerns: No Electronic Signature(s) Signed: 01/16/2023 9:42:08 AM By: [name] MD FACS Entered By: [name] on 01/16/2023 06:13:04 -------------------------------------------------------------------------------- SuperBill Details Patient Name: Date of Service: [name], [facility] RO N 01/16/2023 Medical Record Number: [id number] Patient Account Number: [id number] Date of Birth/Sex: Treating RN: 1941/03/16 (82 y.o. F) Primary Care Provider: Richrd Joseph, [facility] Other Clinician: Referring Provider: Treating Provider/Extender: [name], [facility]  [name], [name] ([id number]) [id number] Page 1 of 17 Visit Report for 01/16/2023 Chief Complaint Document Details Patient Name: Date of Service: [name] RO N 01/16/2023 8:30 A M Medical Record Number: [id number] Patient Account Number: [id number] Date of Birth/Sex: Treating RN: [birth year] (82 y.o. F) Primary Care Provider: Richrd Joseph, [facility] Other Clinician: Referring Provider: Treating Provider/Extender: [name], [facility] Weeks in Treatment: 73 Information Obtained from: Patient Chief Complaint Patient seen for complaints of Non-Healing Wound. Electronic Signature(s) Signed: 01/16/2023 9:11:36 AM By: [name] MD FACS Entered By: [name] on 01/16/2023 06:11:36 -------------------------------------------------------------------------------- Debridement Details Patient Name: Date of Service: [name], [facility] RO N 01/16/2023 8:30 A M Medical Record Number: [id number] Patient Account Number: [id number] Date of Birth/Sex: Treating RN: Jun 20, 1940 (82 y.o. [name] Primary Care Provider: Richrd Joseph, [facility] Other Clinician: Referring Provider: Treating Provider/Extender: [name], [facility] Weeks in Treatment: 31 Debridement Performed for Assessment: Wound #10 Left T Second oe Performed By: Physician [name], MD The following information was scribed by: [name] The information was scribed for: [name] Debridement Type: Debridement Level of Consciousness (Pre-procedure): Awake and Alert Pre-procedure Verification/Time Out Yes - 08:54 Taken: Start Time: 08:54 Pain Control: Lidocaine 4% T opical Solution Percent of Wound Bed Debrided: 100% T Area Debrided (cm): otal 0.27 Tissue and other material debrided: Non-Viable, Slough, Slough Level: Non-Viable Tissue Debridement Description: Selective/Open Wound Instrument: Curette Bleeding: Minimum Hemostasis Achieved:  Pressure Response to Treatment: Procedure was tolerated well Level of Consciousness (Post- Awake and Alert procedure): Post Debridement Measurements of Total Wound Length: (cm) 0.7 Width: (cm) 0.5 Depth: (cm) 0.1 Volume: (cm) 0.027 Character of Wound/Ulcer Post Debridement: Improved Post Procedure Diagnosis [name], [name] ([id number]) [id number] Page 2 of 17 Same as Pre-procedure Electronic Signature(s) Signed: 01/16/2023 9:42:08 AM By: [name] MD FACS Signed: 01/16/2023 4:28:05 PM By: [name] Entered By: [name] on 01/16/2023 05:55:05 -------------------------------------------------------------------------------- Debridement Details Patient Name: Date of Service: [name], [facility] RO N 01/16/2023 8:30 A M Medical Record Number: [id number] Patient Account Number: [id number] Date of Birth/Sex: Treating RN: [birth year] (82 y.o. [name] Primary Care Provider: Richrd Joseph, [facility] Other Clinician: Referring Provider: Treating Provider/Extender: [name], [facility] Weeks in Treatment: 31 Debridement Performed for Assessment: Wound #6 Left,Dorsal Foot Performed By: Physician [name], MD The following information was scribed by: [name] The information was scribed for: [name] Debridement Type: Debridement Level of Consciousness (Pre-procedure): Awake and Alert Pre-procedure Verification/Time Out Yes - 08:54 Taken: Start Time: 08:54 Pain Control: Lidocaine 4% T opical Solution Percent of Wound Bed Debrided: 100% T Area Debrided (cm): otal 1.41 Tissue and other material debrided: Non-Viable, Slough, Slough Level: Non-Viable Tissue Debridement Description: Selective/Open Wound Instrument: Curette Bleeding: Minimum Hemostasis Achieved: Pressure Response to Treatment: Procedure was tolerated well Level of Consciousness (Post- Awake and Alert procedure): Post Debridement  Measurements of Total Wound Length: (cm) 1.5 Width: (cm) 1.2 Depth: (cm) 0.1 Volume: (cm) 0.141 Character of Wound/Ulcer Post Debridement: Improved Post Procedure Diagnosis Same as Pre-procedure Electronic Signature(s) Signed: 01/16/2023 9:42:08 AM By: [name] MD FACS Signed: 01/16/2023 4:28:05 PM By: [name] Entered By: [name] on 01/16/2023 05:55:24 -------------------------------------------------------------------------------- Debridement Details Patient Name: Date of Service: [name], [facility] RO N 01/16/2023 8:30 A M Medical Record Number: [id number] Patient Account Number: [id number] Date of Birth/Sex: Treating RN: 1941-02-09 (82 y.o. [name] Primary Care Provider: Richrd Joseph, [facility] Other Clinician: Marsh Dolly ([id number]) [id number] Page 3 of 17 Referring Provider: Treating  [name], [name] ([id number]) [id number] Page 1 of 17 Visit Report for 01/16/2023 Chief Complaint Document Details Patient Name: Date of Service: [name] RO N 01/16/2023 8:30 A M Medical Record Number: [id number] Patient Account Number: [id number] Date of Birth/Sex: Treating RN: [birth year] (82 y.o. F) Primary Care Provider: Richrd Joseph, [facility] Other Clinician: Referring Provider: Treating Provider/Extender: [name], [facility] Weeks in Treatment: 73 Information Obtained from: Patient Chief Complaint Patient seen for complaints of Non-Healing Wound. Electronic Signature(s) Signed: 01/16/2023 9:11:36 AM By: [name] MD FACS Entered By: [name] on 01/16/2023 06:11:36 -------------------------------------------------------------------------------- Debridement Details Patient Name: Date of Service: [name], [facility] RO N 01/16/2023 8:30 A M Medical Record Number: [id number] Patient Account Number: [id number] Date of Birth/Sex: Treating RN: Jun 20, 1940 (82 y.o. [name] Primary Care Provider: Richrd Joseph, [facility] Other Clinician: Referring Provider: Treating Provider/Extender: [name], [facility] Weeks in Treatment: 31 Debridement Performed for Assessment: Wound #10 Left T Second oe Performed By: Physician [name], MD The following information was scribed by: [name] The information was scribed for: [name] Debridement Type: Debridement Level of Consciousness (Pre-procedure): Awake and Alert Pre-procedure Verification/Time Out Yes - 08:54 Taken: Start Time: 08:54 Pain Control: Lidocaine 4% T opical Solution Percent of Wound Bed Debrided: 100% T Area Debrided (cm): otal 0.27 Tissue and other material debrided: Non-Viable, Slough, Slough Level: Non-Viable Tissue Debridement Description: Selective/Open Wound Instrument: Curette Bleeding: Minimum Hemostasis Achieved:  Pressure Response to Treatment: Procedure was tolerated well Level of Consciousness (Post- Awake and Alert procedure): Post Debridement Measurements of Total Wound Length: (cm) 0.7 Width: (cm) 0.5 Depth: (cm) 0.1 Volume: (cm) 0.027 Character of Wound/Ulcer Post Debridement: Improved Post Procedure Diagnosis [name], [name] ([id number]) [id number] Page 2 of 17 Same as Pre-procedure Electronic Signature(s) Signed: 01/16/2023 9:42:08 AM By: [name] MD FACS Signed: 01/16/2023 4:28:05 PM By: [name] Entered By: [name] on 01/16/2023 05:55:05 -------------------------------------------------------------------------------- Debridement Details Patient Name: Date of Service: [name], [facility] RO N 01/16/2023 8:30 A M Medical Record Number: [id number] Patient Account Number: [id number] Date of Birth/Sex: Treating RN: [birth year] (82 y.o. [name] Primary Care Provider: Richrd Joseph, [facility] Other Clinician: Referring Provider: Treating Provider/Extender: [name], [facility] Weeks in Treatment: 31 Debridement Performed for Assessment: Wound #6 Left,Dorsal Foot Performed By: Physician [name], MD The following information was scribed by: [name] The information was scribed for: [name] Debridement Type: Debridement Level of Consciousness (Pre-procedure): Awake and Alert Pre-procedure Verification/Time Out Yes - 08:54 Taken: Start Time: 08:54 Pain Control: Lidocaine 4% T opical Solution Percent of Wound Bed Debrided: 100% T Area Debrided (cm): otal 1.41 Tissue and other material debrided: Non-Viable, Slough, Slough Level: Non-Viable Tissue Debridement Description: Selective/Open Wound Instrument: Curette Bleeding: Minimum Hemostasis Achieved: Pressure Response to Treatment: Procedure was tolerated well Level of Consciousness (Post- Awake and Alert procedure): Post Debridement  Measurements of Total Wound Length: (cm) 1.5 Width: (cm) 1.2 Depth: (cm) 0.1 Volume: (cm) 0.141 Character of Wound/Ulcer Post Debridement: Improved Post Procedure Diagnosis Same as Pre-procedure Electronic Signature(s) Signed: 01/16/2023 9:42:08 AM By: [name] MD FACS Signed: 01/16/2023 4:28:05 PM By: [name] Entered By: [name] on 01/16/2023 05:55:24 -------------------------------------------------------------------------------- Debridement Details Patient Name: Date of Service: [name], [facility] RO N 01/16/2023 8:30 A M Medical Record Number: [id number] Patient Account Number: [id number] Date of Birth/Sex: Treating RN: 1941-02-09 (82 y.o. [name] Primary Care Provider: Richrd Joseph, [facility] Other Clinician: Marsh Dolly ([id number]) [id number] Page 3 of 17 Referring Provider: Treating  to 3 layer compression). Wound #8 - Foot Wound Laterality: Left, Lateral Cleanser: Soap and Water 1 x Per Week/30 Days Discharge Instructions: May shower and wash wound with dial antibacterial soap and water prior to dressing change. Cleanser: Wound Cleanser 1 x Per Week/30 Days Discharge Instructions: Cleanse the wound with wound cleanser prior to applying a clean dressing using gauze sponges, not tissue or cotton balls. Peri-Wound Care: Sween Lotion (Moisturizing lotion) 1 x Per Week/30 Days Discharge Instructions: Apply moisturizing lotion as directed Topical: Gentamicin 1 x Per Week/30 Days Discharge Instructions: As directed by physician Topical: Mupirocin Ointment 1 x Per Week/30 Days Discharge Instructions: Apply Mupirocin (Bactroban) as instructed Topical: Ketoconazole Cream 2% 1 x Per Week/30 Days Discharge Instructions: Apply Ketoconazole as directed Topical: zinc 1 x Per Week/30 Days Prim Dressing: Endoform 2x2 in 1 x Per Week/30 Days ary Discharge Instructions: Moisten with saline Secondary Dressing: Drawtex 4x4 in 1 x Per Week/30 Days Discharge Instructions: Apply over primary dressing as directed. Secondary Dressing: Woven Gauze Sponge, Non-Sterile 4x4 in 1 x Per Week/30 Days Discharge Instructions: can use gauze between toes (if no desired) Secondary Dressing: Zetuvit Plus 4x8 in 1 x Per Week/30 Days Discharge Instructions: Apply over primary dressing as directed. Compression Wrap: Urgo K2 Lite, (equivalent to a 3 layer) two layer compression system, regular 1 x Per Week/30 Days Discharge Instructions: Apply Urgo K2 Lite as directed (alternative to 3 layer compression). Wound #9 - T Great oe Wound Laterality: Left, Lateral Cleanser: Soap and Water 1 x Per Week/30 Days Discharge Instructions: May shower and wash wound with dial antibacterial soap and water prior to dressing change. Cleanser: Wound  Cleanser 1 x Per Week/30 Days Discharge Instructions: Cleanse the wound with wound cleanser prior to applying a clean dressing using gauze sponges, not tissue or cotton balls. Peri-Wound Care: Sween Lotion (Moisturizing lotion) 1 x Per Week/30 Days Discharge Instructions: Apply moisturizing lotion as directed Topical: Gentamicin 1 x Per Week/30 Days Discharge Instructions: As directed by physician Topical: Mupirocin Ointment 1 x Per Week/30 Days Discharge Instructions: Apply Mupirocin (Bactroban) as instructed Topical: Ketoconazole Cream 2% 1 x Per Week/30 Days Discharge Instructions: Apply Ketoconazole as directed Topical: zinc 1 x Per Week/30 Days Prim Dressing: Endoform 2x2 in 1 x Per Week/30 Days ary Discharge Instructions: Moisten with saline Secondary Dressing: Drawtex 4x4 in 1 x Per Week/30 Days Discharge Instructions: Apply over primary dressing as directed. Secondary Dressing: Woven Gauze Sponge, Non-Sterile 4x4 in 1 x Per Week/30 Days Discharge Instructions: can use gauze between toes (if no desired) Secondary Dressing: Zetuvit Plus 4x8 in 1 x Per Week/30 Days Discharge Instructions: Apply over primary dressing as directed. [name], [name] ([id number]) [id number] Page 9 of 17 Compression Wrap: Urgo K2 Lite, (equivalent to a 3 layer) two layer compression system, regular 1 x Per Week/30 Days Discharge Instructions: Apply Urgo K2 Lite as directed (alternative to 3 layer compression). Patient Medications llergies: Sulfa (Sulfonamide Antibiotics) A Notifications Medication Indication Start End 01/16/2023 lidocaine DOSE topical 4 % cream - cream topical Electronic Signature(s) Signed: 01/16/2023 9:42:08 AM By: [name] MD FACS Entered By: [name] on 01/16/2023 06:13:48 -------------------------------------------------------------------------------- Problem List Details Patient Name: Date of Service: [name], [facility] RO N 01/16/2023  8:30 A M Medical Record Number: [id number] Patient Account Number: [id number] Date of Birth/Sex: Treating RN: [birth year] (82 y.o. F) Primary Care Provider: Richrd Joseph, [facility] Other Clinician: Referring Provider: Treating Provider/Extender: [name], [facility] Weeks in Treatment: 24 Active Problems ICD-10 Encounter Code Description

## 2023-01-16 NOTE — Progress Notes (Signed)
Epithelialization: Medium (34-66%) Tunneling: No Undermining: No Wound Description Classification: 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 Support Structures Wound Margin: Indistinct, nonvisible Exudate Amount: Medium Exudate Type: Serosanguineous Exudate Color: red, brown Foul Odor After Cleansing: No Slough/Fibrino Yes Wound Bed Granulation Amount: Small (1-33%) Exposed Structure Granulation Quality: Pink Fascia Exposed: No Necrotic Amount: Large (67-100%) Fat Layer (Subcutaneous Tissue) Exposed: Yes Necrotic Quality: Adherent Slough Tendon Exposed: No Muscle Exposed: No Joint Exposed: No Bone Exposed: No Periwound Skin Texture Texture Color No Abnormalities Noted: Yes No Abnormalities  Noted: No Rubor: Yes Moisture No Abnormalities Noted: Yes Temperature / Pain Temperature: No Abnormality Tenderness on Palpation: Yes Treatment Notes Wound #6 (Foot) Wound Laterality: Dorsal, Left Cleanser Soap and Water Discharge Instruction: May shower and wash wound with dial antibacterial soap and water prior to dressing change. Wound Cleanser Discharge Instruction: Cleanse the wound with wound cleanser prior to applying a clean dressing using gauze sponges, not tissue or cotton balls. Peri-Wound Care Sween Lotion (Moisturizing lotion) Discharge Instruction: Apply moisturizing lotion as directed Topical Gentamicin Discharge Instruction: As directed by physician Mupirocin Ointment Discharge Instruction: Apply Mupirocin (Bactroban) as instructed Ketoconazole Cream 2% Discharge Instruction: Apply Ketoconazole as directed zinc Primary Dressing Maxorb Extra Ag+ Alginate Dressing, 4x4.75 (in/in) Discharge Instruction: Apply to wound bed as instructed Secondary Dressing Joseph, Kathryn December ([id number]) [id number] Page 10 of 14 Drawtex 4x4 in Discharge Instruction: Apply over primary dressing as directed. Woven Gauze Sponge, Non-Sterile 4x4 in Discharge Instruction: can use gauze between toes (if no desired) Zetuvit Plus 4x8 in Discharge Instruction: Apply over primary dressing as directed. Secured With Compression Wrap Urgo K2 Lite, (equivalent to a 3 layer) two layer compression system, regular Discharge Instruction: Apply Urgo K2 Lite as directed (alternative to 3 layer compression). Compression Stockings Add-Ons Electronic Signature(s) Signed: 01/16/2023 9:49:33 AM By: [name] Signed: 01/16/2023 4:28:05 PM By: [name] Entered By: [name] on 01/16/2023 05:49:41 -------------------------------------------------------------------------------- Wound Assessment Details Patient Name: Date of Service: [name], [name] 01/16/2023 8:30  A M Medical Record Number: [id number] Patient Account Number: [id number] Date of Birth/Sex: Treating RN: 26-Jan-1941 (82 y.o. [name] Primary Care [name]: [name], [facility] Other Clinician: Referring [name]: Treating [name]: [name] SHO [name], [facility] Weeks in Treatment: 31 Wound Status Wound Number: 8 Primary Etiology: Pressure Ulcer Wound Location: Left, Lateral Foot Wound Status: Open Wounding Event: Pressure Injury Comorbid History: Anemia, Angina, Hypertension, Vasculitis, Osteoarthritis Date Acquired: 10/24/2022 Weeks Of Treatment: 12 Clustered Wound: No Photos Wound Measurements Length: (cm) 1 Width: (cm) 1.1 Depth: (cm) 0.2 Area: (cm) 0.864 Volume: (cm) 0.173 % Reduction in Area: -340.8% % Reduction in Volume: -343.6% Epithelialization: Small (1-33%) Tunneling: No Undermining: No Wound Description Classification: Category/Stage IV Wound Margin: Distinct, outline attached Exudate Amount: Medium Exudate Type: Serosanguineous Exudate Color: red, brown Joseph, Kathryn ([id number]) Foul Odor After Cleansing: No Slough/Fibrino Yes [id number] Page 11 of 14 Wound Bed Granulation Amount: Small (1-33%) Exposed Structure Granulation Quality: Pink Fascia Exposed: No Necrotic Amount: Large (67-100%) Fat Layer (Subcutaneous Tissue) Exposed: Yes Necrotic Quality: Adherent Slough Tendon Exposed: No Muscle Exposed: No Joint Exposed: No Bone Exposed: No Periwound Skin Texture Texture Color No Abnormalities Noted: Yes No Abnormalities Noted: Yes Moisture Temperature / Pain No Abnormalities Noted: Yes Temperature: No Abnormality Tenderness on Palpation: Yes Treatment Notes Wound #8 (Foot) Wound Laterality: Left, Lateral Cleanser Soap and Water Discharge Instruction: May shower and wash wound with dial antibacterial soap and water prior to dressing change. Wound Cleanser Discharge Instruction: Cleanse the wound  with wound cleanser  Joseph, Kathryn ([id number]) [id number] Page 1 of 14 Visit Report for 01/16/2023 Arrival Information Details Patient Name: Date of Service: [name] Joseph 01/16/2023 8:30 A M Medical Record Number: [id number] Patient Account Number: [id number] Date of Birth/Sex: Treating RN: [birth year] (82 y.o. F) Primary Care [name]: [name], [facility] Other Clinician: Referring Srihaan Mastrangelo: Treating [name]/Extender: [name], [facility] Weeks in Treatment: 31 Visit Information History Since Last Visit Added or deleted any medications: No Patient Arrived: Wheel Chair Any new allergies or adverse reactions: No Arrival Time: 08:30 Had a fall or experienced change in No Accompanied By: aide activities of daily living that may affect Transfer Assistance: None risk of falls: Patient Identification Verified: Yes Signs or symptoms of abuse/neglect since last visito No Secondary Verification Process Completed: Yes Hospitalized since last visit: No Patient Requires Transmission-Based Precautions: No Implantable device outside of the clinic excluding No Patient Has Alerts: Yes cellular tissue based products placed in the center Patient Alerts: ABIs: R:0.82 L:0.84 7/24 since last visit: Pain Present Now: No Electronic Signature(s) Signed: 01/16/2023 9:49:33 AM By: [name] Entered By: [name] on 01/16/2023 05:31:04 -------------------------------------------------------------------------------- Compression Therapy Details Patient Name: Date of Service: [name] [facility] Joseph Joseph 01/16/2023 8:30 A M Medical Record Number: [id number] Patient Account Number: [id number] Date of Birth/Sex: Treating RN: 02/14/1941 (82 y.o. [name] Primary Care Jamilla Galli: [name], [facility] Other Clinician: Referring Blaise Grieshaber: Treating Luciano Cinquemani/Extender: [name] SHO [name], [facility] Weeks in Treatment: 31 Compression Therapy Performed for Wound Assessment: Wound #8  Left,Lateral Foot Performed By: Clinician [name], RN Compression Type: Double Layer Post Procedure Diagnosis Same as Pre-procedure Electronic Signature(s) Signed: 01/16/2023 4:28:05 PM By: Gelene Mink By: [name] on 01/16/2023 05:56:39 Encounter Discharge Information Details -------------------------------------------------------------------------------- Marsh Dolly ([id number]) [id number] Page 2 of 14 Patient Name: Date of Service: [name] 01/16/2023 8:30 A M Medical Record Number: [id number] Patient Account Number: [id number] Date of Birth/Sex: Treating RN: 24-May-1940 (82 y.o. [name] Primary Care Yavonne Kiss: [name], [facility] Other Clinician: Referring Roberta Angell: Treating Almedia Cordell/Extender: [name] SHO [name], [facility] Weeks in Treatment: 33 Encounter Discharge Information Items Post Procedure Vitals Discharge Condition: Stable Temperature (F): 98 Ambulatory Status: Wheelchair Pulse (bpm): 94 Discharge Destination: Skilled Nursing Facility Respiratory Rate (breaths/min): 18 Telephoned: No Blood Pressure (mmHg): 106/70 Orders Sent: Yes Transportation: Private Auto Accompanied By: caregiver Schedule Follow-up Appointment: Yes Clinical Summary of Care: Patient Declined Electronic Signature(s) Signed: 01/16/2023 4:28:05 PM By: [name] Entered By: [name] on 01/16/2023 06:45:44 -------------------------------------------------------------------------------- Lower Extremity Assessment Details Patient Name: Date of Service: [name] [facility] Joseph Joseph 01/16/2023 8:30 A M Medical Record Number: [id number] Patient Account Number: [id number] Date of Birth/Sex: Treating RN: Apr 14, 1941 (82 y.o. [name] Primary Care Ronda Kazmi: [name], [facility] Other Clinician: Referring Keevon Henney: Treating Eschol Auxier/Extender: [name] SHO [name], [facility] Weeks in Treatment: 31 Edema  Assessment Assessed: [Left: No] [Right: No] Edema: [Left: Ye] [Right: s] Calf Left: Right: Point of Measurement: From Medial Instep 28 cm 27.5 cm Ankle Left: Right: Point of Measurement: From Medial Instep 20.5 cm 19 cm Vascular Assessment Pulses: Dorsalis Pedis Palpable: [Left:Yes] Extremity colors, hair growth, and conditions: Extremity Color: [Left:Hyperpigmented] Hair Growth on Extremity: [Left:No] [Right:No] Temperature of Extremity: [Left:Cool] [Right:Cool] Capillary Refill: [Left:> 3 seconds] Dependent Rubor: [Left:Yes No] [Right:Yes No] Electronic Signature(s) Signed: 01/16/2023 4:28:05 PM By: [name] Entered By: [name] on 01/16/2023 05:44:44 Joseph, Kathryn ([id number]) [id number] Page 3 of 14 -------------------------------------------------------------------------------- Multi Wound Chart Details Patient Name: Date of  Joseph, Kathryn ([id number]) [id number] Page 1 of 14 Visit Report for 01/16/2023 Arrival Information Details Patient Name: Date of Service: [name] Joseph 01/16/2023 8:30 A M Medical Record Number: [id number] Patient Account Number: [id number] Date of Birth/Sex: Treating RN: [birth year] (82 y.o. F) Primary Care [name]: [name], [facility] Other Clinician: Referring Srihaan Mastrangelo: Treating [name]/Extender: [name], [facility] Weeks in Treatment: 31 Visit Information History Since Last Visit Added or deleted any medications: No Patient Arrived: Wheel Chair Any new allergies or adverse reactions: No Arrival Time: 08:30 Had a fall or experienced change in No Accompanied By: aide activities of daily living that may affect Transfer Assistance: None risk of falls: Patient Identification Verified: Yes Signs or symptoms of abuse/neglect since last visito No Secondary Verification Process Completed: Yes Hospitalized since last visit: No Patient Requires Transmission-Based Precautions: No Implantable device outside of the clinic excluding No Patient Has Alerts: Yes cellular tissue based products placed in the center Patient Alerts: ABIs: R:0.82 L:0.84 7/24 since last visit: Pain Present Now: No Electronic Signature(s) Signed: 01/16/2023 9:49:33 AM By: [name] Entered By: [name] on 01/16/2023 05:31:04 -------------------------------------------------------------------------------- Compression Therapy Details Patient Name: Date of Service: [name] [facility] Joseph Joseph 01/16/2023 8:30 A M Medical Record Number: [id number] Patient Account Number: [id number] Date of Birth/Sex: Treating RN: 02/14/1941 (82 y.o. [name] Primary Care Jamilla Galli: [name], [facility] Other Clinician: Referring Blaise Grieshaber: Treating Luciano Cinquemani/Extender: [name] SHO [name], [facility] Weeks in Treatment: 31 Compression Therapy Performed for Wound Assessment: Wound #8  Left,Lateral Foot Performed By: Clinician [name], RN Compression Type: Double Layer Post Procedure Diagnosis Same as Pre-procedure Electronic Signature(s) Signed: 01/16/2023 4:28:05 PM By: Gelene Mink By: [name] on 01/16/2023 05:56:39 Encounter Discharge Information Details -------------------------------------------------------------------------------- Marsh Dolly ([id number]) [id number] Page 2 of 14 Patient Name: Date of Service: [name] 01/16/2023 8:30 A M Medical Record Number: [id number] Patient Account Number: [id number] Date of Birth/Sex: Treating RN: 24-May-1940 (82 y.o. [name] Primary Care Yavonne Kiss: [name], [facility] Other Clinician: Referring Roberta Angell: Treating Almedia Cordell/Extender: [name] SHO [name], [facility] Weeks in Treatment: 33 Encounter Discharge Information Items Post Procedure Vitals Discharge Condition: Stable Temperature (F): 98 Ambulatory Status: Wheelchair Pulse (bpm): 94 Discharge Destination: Skilled Nursing Facility Respiratory Rate (breaths/min): 18 Telephoned: No Blood Pressure (mmHg): 106/70 Orders Sent: Yes Transportation: Private Auto Accompanied By: caregiver Schedule Follow-up Appointment: Yes Clinical Summary of Care: Patient Declined Electronic Signature(s) Signed: 01/16/2023 4:28:05 PM By: [name] Entered By: [name] on 01/16/2023 06:45:44 -------------------------------------------------------------------------------- Lower Extremity Assessment Details Patient Name: Date of Service: [name] [facility] Joseph Joseph 01/16/2023 8:30 A M Medical Record Number: [id number] Patient Account Number: [id number] Date of Birth/Sex: Treating RN: Apr 14, 1941 (82 y.o. [name] Primary Care Ronda Kazmi: [name], [facility] Other Clinician: Referring Keevon Henney: Treating Eschol Auxier/Extender: [name] SHO [name], [facility] Weeks in Treatment: 31 Edema  Assessment Assessed: [Left: No] [Right: No] Edema: [Left: Ye] [Right: s] Calf Left: Right: Point of Measurement: From Medial Instep 28 cm 27.5 cm Ankle Left: Right: Point of Measurement: From Medial Instep 20.5 cm 19 cm Vascular Assessment Pulses: Dorsalis Pedis Palpable: [Left:Yes] Extremity colors, hair growth, and conditions: Extremity Color: [Left:Hyperpigmented] Hair Growth on Extremity: [Left:No] [Right:No] Temperature of Extremity: [Left:Cool] [Right:Cool] Capillary Refill: [Left:> 3 seconds] Dependent Rubor: [Left:Yes No] [Right:Yes No] Electronic Signature(s) Signed: 01/16/2023 4:28:05 PM By: [name] Entered By: [name] on 01/16/2023 05:44:44 Joseph, Kathryn ([id number]) [id number] Page 3 of 14 -------------------------------------------------------------------------------- Multi Wound Chart Details Patient Name: Date of  Joseph, Kathryn ([id number]) [id number] Page 1 of 14 Visit Report for 01/16/2023 Arrival Information Details Patient Name: Date of Service: [name] Joseph 01/16/2023 8:30 A M Medical Record Number: [id number] Patient Account Number: [id number] Date of Birth/Sex: Treating RN: [birth year] (82 y.o. F) Primary Care [name]: [name], [facility] Other Clinician: Referring Srihaan Mastrangelo: Treating [name]/Extender: [name], [facility] Weeks in Treatment: 31 Visit Information History Since Last Visit Added or deleted any medications: No Patient Arrived: Wheel Chair Any new allergies or adverse reactions: No Arrival Time: 08:30 Had a fall or experienced change in No Accompanied By: aide activities of daily living that may affect Transfer Assistance: None risk of falls: Patient Identification Verified: Yes Signs or symptoms of abuse/neglect since last visito No Secondary Verification Process Completed: Yes Hospitalized since last visit: No Patient Requires Transmission-Based Precautions: No Implantable device outside of the clinic excluding No Patient Has Alerts: Yes cellular tissue based products placed in the center Patient Alerts: ABIs: R:0.82 L:0.84 7/24 since last visit: Pain Present Now: No Electronic Signature(s) Signed: 01/16/2023 9:49:33 AM By: [name] Entered By: [name] on 01/16/2023 05:31:04 -------------------------------------------------------------------------------- Compression Therapy Details Patient Name: Date of Service: [name] [facility] Joseph Joseph 01/16/2023 8:30 A M Medical Record Number: [id number] Patient Account Number: [id number] Date of Birth/Sex: Treating RN: 02/14/1941 (82 y.o. [name] Primary Care Jamilla Galli: [name], [facility] Other Clinician: Referring Blaise Grieshaber: Treating Luciano Cinquemani/Extender: [name] SHO [name], [facility] Weeks in Treatment: 31 Compression Therapy Performed for Wound Assessment: Wound #8  Left,Lateral Foot Performed By: Clinician [name], RN Compression Type: Double Layer Post Procedure Diagnosis Same as Pre-procedure Electronic Signature(s) Signed: 01/16/2023 4:28:05 PM By: Gelene Mink By: [name] on 01/16/2023 05:56:39 Encounter Discharge Information Details -------------------------------------------------------------------------------- Marsh Dolly ([id number]) [id number] Page 2 of 14 Patient Name: Date of Service: [name] 01/16/2023 8:30 A M Medical Record Number: [id number] Patient Account Number: [id number] Date of Birth/Sex: Treating RN: 24-May-1940 (82 y.o. [name] Primary Care Yavonne Kiss: [name], [facility] Other Clinician: Referring Roberta Angell: Treating Almedia Cordell/Extender: [name] SHO [name], [facility] Weeks in Treatment: 33 Encounter Discharge Information Items Post Procedure Vitals Discharge Condition: Stable Temperature (F): 98 Ambulatory Status: Wheelchair Pulse (bpm): 94 Discharge Destination: Skilled Nursing Facility Respiratory Rate (breaths/min): 18 Telephoned: No Blood Pressure (mmHg): 106/70 Orders Sent: Yes Transportation: Private Auto Accompanied By: caregiver Schedule Follow-up Appointment: Yes Clinical Summary of Care: Patient Declined Electronic Signature(s) Signed: 01/16/2023 4:28:05 PM By: [name] Entered By: [name] on 01/16/2023 06:45:44 -------------------------------------------------------------------------------- Lower Extremity Assessment Details Patient Name: Date of Service: [name] [facility] Joseph Joseph 01/16/2023 8:30 A M Medical Record Number: [id number] Patient Account Number: [id number] Date of Birth/Sex: Treating RN: Apr 14, 1941 (82 y.o. [name] Primary Care Ronda Kazmi: [name], [facility] Other Clinician: Referring Keevon Henney: Treating Eschol Auxier/Extender: [name] SHO [name], [facility] Weeks in Treatment: 31 Edema  Assessment Assessed: [Left: No] [Right: No] Edema: [Left: Ye] [Right: s] Calf Left: Right: Point of Measurement: From Medial Instep 28 cm 27.5 cm Ankle Left: Right: Point of Measurement: From Medial Instep 20.5 cm 19 cm Vascular Assessment Pulses: Dorsalis Pedis Palpable: [Left:Yes] Extremity colors, hair growth, and conditions: Extremity Color: [Left:Hyperpigmented] Hair Growth on Extremity: [Left:No] [Right:No] Temperature of Extremity: [Left:Cool] [Right:Cool] Capillary Refill: [Left:> 3 seconds] Dependent Rubor: [Left:Yes No] [Right:Yes No] Electronic Signature(s) Signed: 01/16/2023 4:28:05 PM By: [name] Entered By: [name] on 01/16/2023 05:44:44 Joseph, Kathryn ([id number]) [id number] Page 3 of 14 -------------------------------------------------------------------------------- Multi Wound Chart Details Patient Name: Date of  Epithelialization: Medium (34-66%) Tunneling: No Undermining: No Wound Description Classification: 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 Support Structures Wound Margin: Indistinct, nonvisible Exudate Amount: Medium Exudate Type: Serosanguineous Exudate Color: red, brown Foul Odor After Cleansing: No Slough/Fibrino Yes Wound Bed Granulation Amount: Small (1-33%) Exposed Structure Granulation Quality: Pink Fascia Exposed: No Necrotic Amount: Large (67-100%) Fat Layer (Subcutaneous Tissue) Exposed: Yes Necrotic Quality: Adherent Slough Tendon Exposed: No Muscle Exposed: No Joint Exposed: No Bone Exposed: No Periwound Skin Texture Texture Color No Abnormalities Noted: Yes No Abnormalities  Noted: No Rubor: Yes Moisture No Abnormalities Noted: Yes Temperature / Pain Temperature: No Abnormality Tenderness on Palpation: Yes Treatment Notes Wound #6 (Foot) Wound Laterality: Dorsal, Left Cleanser Soap and Water Discharge Instruction: May shower and wash wound with dial antibacterial soap and water prior to dressing change. Wound Cleanser Discharge Instruction: Cleanse the wound with wound cleanser prior to applying a clean dressing using gauze sponges, not tissue or cotton balls. Peri-Wound Care Sween Lotion (Moisturizing lotion) Discharge Instruction: Apply moisturizing lotion as directed Topical Gentamicin Discharge Instruction: As directed by physician Mupirocin Ointment Discharge Instruction: Apply Mupirocin (Bactroban) as instructed Ketoconazole Cream 2% Discharge Instruction: Apply Ketoconazole as directed zinc Primary Dressing Maxorb Extra Ag+ Alginate Dressing, 4x4.75 (in/in) Discharge Instruction: Apply to wound bed as instructed Secondary Dressing Joseph, Kathryn December ([id number]) [id number] Page 10 of 14 Drawtex 4x4 in Discharge Instruction: Apply over primary dressing as directed. Woven Gauze Sponge, Non-Sterile 4x4 in Discharge Instruction: can use gauze between toes (if no desired) Zetuvit Plus 4x8 in Discharge Instruction: Apply over primary dressing as directed. Secured With Compression Wrap Urgo K2 Lite, (equivalent to a 3 layer) two layer compression system, regular Discharge Instruction: Apply Urgo K2 Lite as directed (alternative to 3 layer compression). Compression Stockings Add-Ons Electronic Signature(s) Signed: 01/16/2023 9:49:33 AM By: [name] Signed: 01/16/2023 4:28:05 PM By: [name] Entered By: [name] on 01/16/2023 05:49:41 -------------------------------------------------------------------------------- Wound Assessment Details Patient Name: Date of Service: [name], [name] 01/16/2023 8:30  A M Medical Record Number: [id number] Patient Account Number: [id number] Date of Birth/Sex: Treating RN: 26-Jan-1941 (82 y.o. [name] Primary Care [name]: [name], [facility] Other Clinician: Referring [name]: Treating [name]: [name] SHO [name], [facility] Weeks in Treatment: 31 Wound Status Wound Number: 8 Primary Etiology: Pressure Ulcer Wound Location: Left, Lateral Foot Wound Status: Open Wounding Event: Pressure Injury Comorbid History: Anemia, Angina, Hypertension, Vasculitis, Osteoarthritis Date Acquired: 10/24/2022 Weeks Of Treatment: 12 Clustered Wound: No Photos Wound Measurements Length: (cm) 1 Width: (cm) 1.1 Depth: (cm) 0.2 Area: (cm) 0.864 Volume: (cm) 0.173 % Reduction in Area: -340.8% % Reduction in Volume: -343.6% Epithelialization: Small (1-33%) Tunneling: No Undermining: No Wound Description Classification: Category/Stage IV Wound Margin: Distinct, outline attached Exudate Amount: Medium Exudate Type: Serosanguineous Exudate Color: red, brown Joseph, Kathryn ([id number]) Foul Odor After Cleansing: No Slough/Fibrino Yes [id number] Page 11 of 14 Wound Bed Granulation Amount: Small (1-33%) Exposed Structure Granulation Quality: Pink Fascia Exposed: No Necrotic Amount: Large (67-100%) Fat Layer (Subcutaneous Tissue) Exposed: Yes Necrotic Quality: Adherent Slough Tendon Exposed: No Muscle Exposed: No Joint Exposed: No Bone Exposed: No Periwound Skin Texture Texture Color No Abnormalities Noted: Yes No Abnormalities Noted: Yes Moisture Temperature / Pain No Abnormalities Noted: Yes Temperature: No Abnormality Tenderness on Palpation: Yes Treatment Notes Wound #8 (Foot) Wound Laterality: Left, Lateral Cleanser Soap and Water Discharge Instruction: May shower and wash wound with dial antibacterial soap and water prior to dressing change. Wound Cleanser Discharge Instruction: Cleanse the wound  with wound cleanser  [name] Granulation Quality: Large (67-100%) [name] [name] Necrotic A mount: Fat Layer (Subcutaneous Tissue): Yes [name] [name] Exposed Structures: Bone: Yes Fascia: No Tendon: No Muscle: No Joint: No Large (67-100%) [name] [name] Epithelialization: Debridement - Selective/Open Wound [name] [name] Debridement: Pre-procedure Verification/Time Out 08:54 [name] [name] Taken: Lidocaine 4% Topical Solution [name] [name] Pain Control: Slough [name] [name] Tissue Debrided: Non-Viable Tissue [name] [name] Level: 0.05 [name]  [name] Debridement A (sq cm): rea Curette [name] [name] Instrument: Minimum [name] [name] Bleeding: Pressure [name] [name] Hemostasis A chieved: Procedure was tolerated well [name] [name] Debridement Treatment Response: 0.2x0.3x0.1 [name] [name] Post Debridement Measurements L x W x D (cm) 0.005 [name] [name] Post Debridement Volume: (cm) [name] [name] [name] Post Debridement Stage: No Abnormalities Noted [name] [name] Periwound Skin Texture: No Abnormalities Noted [name] [name] Periwound Skin Moisture: Rubor: Yes [name] [name] Periwound Skin Color: No Abnormality [name] [name] Temperature: Yes [name] [name] Tenderness on Palpation: Debridement [name] [name] Procedures Performed: Treatment Notes Electronic Signature(s) Signed: 01/16/2023 9:11:10 AM By: [name] MD FACS Entered By: [name] on 01/16/2023 06:11:10 Joseph, Kathryn ([id number]) [id number] Page 5 of 14 -------------------------------------------------------------------------------- Multi-Disciplinary Care Plan Details Patient Name: Date of Service: [name] 01/16/2023 8:30 A M Medical Record Number: [id number] Patient Account Number: [id number] Date of Birth/Sex: Treating RN: 03/02/1941 (82 y.o. [name] Primary Care Rigel Filsinger: [name], [facility] Other Clinician: Referring Ether Goebel: Treating Coner Gibbard/Extender: [name], [facility] Weeks in Treatment: 74 Multidisciplinary Care Plan reviewed with physician Active Inactive Necrotic Tissue Nursing Diagnoses: Impaired tissue integrity related to necrotic/devitalized tissue Knowledge deficit related to management of necrotic/devitalized tissue Goals: Necrotic/devitalized tissue will be minimized in the wound bed Date Initiated: 06/12/2022 Target Resolution Date: 02/22/2023 Goal Status: Active Patient/caregiver will verbalize understanding of reason and process for debridement of necrotic tissue Date Initiated: 06/12/2022 Target Resolution Date: 02/22/2023 Goal Status:  Active Interventions: Assess patient pain level pre-, during and post procedure and prior to discharge Provide education on necrotic tissue and debridement process Treatment Activities: Apply topical anesthetic as ordered : 06/12/2022 Notes: Wound/Skin Impairment Nursing Diagnoses: Impaired tissue integrity Knowledge deficit related to ulceration/compromised skin integrity Goals: Patient/caregiver will verbalize understanding of skin care regimen Date Initiated: 06/12/2022 Target Resolution Date: 02/22/2023 Goal Status: Active Interventions: Assess ulceration(s) every visit Treatment Activities: Skin care regimen initiated : 06/12/2022 Topical wound management initiated : 06/12/2022 Notes: Electronic Signature(s) Signed: 01/16/2023 4:28:05 PM By: [name] Entered By: [name] on 01/16/2023 05:41:27 Joseph, Kathryn ([id number]) [id number] Page 6 of 14 -------------------------------------------------------------------------------- Pain Assessment Details Patient Name: Date of Service: [name] 01/16/2023 8:30 A M Medical Record Number: [id number] Patient Account Number: [id number] Date of Birth/Sex: Treating RN: 1940-08-12 (82 y.o. F) Primary Care Chastity Noland: [name], [facility] Other Clinician: Referring Mckinzy Fuller: Treating Lucerito Rosinski/Extender: [name], [facility] Weeks in Treatment: 79 Active Problems Location of Pain Severity and Description of Pain Patient Has Paino No Site Locations Pain Management and Medication Current Pain Management: Electronic Signature(s) Signed: 01/16/2023 9:49:33 AM By: [name] Entered By: [name] on 01/16/2023 05:31:52 -------------------------------------------------------------------------------- Patient/Caregiver Education Details Patient Name: Date of Service: [name], [name] Joseph 9/25/2024andnbsp8:30 A M Medical Record Number: [id number] Patient Account Number:  [id number] Date of Birth/Gender: Treating RN: [birth year] (82 y.o. [name] Primary Care Physician: [name], [facility] Other Clinician: Referring Physician: Treating Physician/Extender: [name], [facility] Weeks in Treatment: 17 Education Assessment Education Provided To: Patient Education Topics Provided Wound/Skin Impairment: Methods: Explain/Verbal Responses: Reinforcements needed, State content  Epithelialization: Medium (34-66%) Tunneling: No Undermining: No Wound Description Classification: 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 Support Structures Wound Margin: Indistinct, nonvisible Exudate Amount: Medium Exudate Type: Serosanguineous Exudate Color: red, brown Foul Odor After Cleansing: No Slough/Fibrino Yes Wound Bed Granulation Amount: Small (1-33%) Exposed Structure Granulation Quality: Pink Fascia Exposed: No Necrotic Amount: Large (67-100%) Fat Layer (Subcutaneous Tissue) Exposed: Yes Necrotic Quality: Adherent Slough Tendon Exposed: No Muscle Exposed: No Joint Exposed: No Bone Exposed: No Periwound Skin Texture Texture Color No Abnormalities Noted: Yes No Abnormalities  Noted: No Rubor: Yes Moisture No Abnormalities Noted: Yes Temperature / Pain Temperature: No Abnormality Tenderness on Palpation: Yes Treatment Notes Wound #6 (Foot) Wound Laterality: Dorsal, Left Cleanser Soap and Water Discharge Instruction: May shower and wash wound with dial antibacterial soap and water prior to dressing change. Wound Cleanser Discharge Instruction: Cleanse the wound with wound cleanser prior to applying a clean dressing using gauze sponges, not tissue or cotton balls. Peri-Wound Care Sween Lotion (Moisturizing lotion) Discharge Instruction: Apply moisturizing lotion as directed Topical Gentamicin Discharge Instruction: As directed by physician Mupirocin Ointment Discharge Instruction: Apply Mupirocin (Bactroban) as instructed Ketoconazole Cream 2% Discharge Instruction: Apply Ketoconazole as directed zinc Primary Dressing Maxorb Extra Ag+ Alginate Dressing, 4x4.75 (in/in) Discharge Instruction: Apply to wound bed as instructed Secondary Dressing Joseph, Kathryn December ([id number]) [id number] Page 10 of 14 Drawtex 4x4 in Discharge Instruction: Apply over primary dressing as directed. Woven Gauze Sponge, Non-Sterile 4x4 in Discharge Instruction: can use gauze between toes (if no desired) Zetuvit Plus 4x8 in Discharge Instruction: Apply over primary dressing as directed. Secured With Compression Wrap Urgo K2 Lite, (equivalent to a 3 layer) two layer compression system, regular Discharge Instruction: Apply Urgo K2 Lite as directed (alternative to 3 layer compression). Compression Stockings Add-Ons Electronic Signature(s) Signed: 01/16/2023 9:49:33 AM By: [name] Signed: 01/16/2023 4:28:05 PM By: [name] Entered By: [name] on 01/16/2023 05:49:41 -------------------------------------------------------------------------------- Wound Assessment Details Patient Name: Date of Service: [name], [name] 01/16/2023 8:30  A M Medical Record Number: [id number] Patient Account Number: [id number] Date of Birth/Sex: Treating RN: 26-Jan-1941 (82 y.o. [name] Primary Care [name]: [name], [facility] Other Clinician: Referring [name]: Treating [name]: [name] SHO [name], [facility] Weeks in Treatment: 31 Wound Status Wound Number: 8 Primary Etiology: Pressure Ulcer Wound Location: Left, Lateral Foot Wound Status: Open Wounding Event: Pressure Injury Comorbid History: Anemia, Angina, Hypertension, Vasculitis, Osteoarthritis Date Acquired: 10/24/2022 Weeks Of Treatment: 12 Clustered Wound: No Photos Wound Measurements Length: (cm) 1 Width: (cm) 1.1 Depth: (cm) 0.2 Area: (cm) 0.864 Volume: (cm) 0.173 % Reduction in Area: -340.8% % Reduction in Volume: -343.6% Epithelialization: Small (1-33%) Tunneling: No Undermining: No Wound Description Classification: Category/Stage IV Wound Margin: Distinct, outline attached Exudate Amount: Medium Exudate Type: Serosanguineous Exudate Color: red, brown Joseph, Kathryn ([id number]) Foul Odor After Cleansing: No Slough/Fibrino Yes [id number] Page 11 of 14 Wound Bed Granulation Amount: Small (1-33%) Exposed Structure Granulation Quality: Pink Fascia Exposed: No Necrotic Amount: Large (67-100%) Fat Layer (Subcutaneous Tissue) Exposed: Yes Necrotic Quality: Adherent Slough Tendon Exposed: No Muscle Exposed: No Joint Exposed: No Bone Exposed: No Periwound Skin Texture Texture Color No Abnormalities Noted: Yes No Abnormalities Noted: Yes Moisture Temperature / Pain No Abnormalities Noted: Yes Temperature: No Abnormality Tenderness on Palpation: Yes Treatment Notes Wound #8 (Foot) Wound Laterality: Left, Lateral Cleanser Soap and Water Discharge Instruction: May shower and wash wound with dial antibacterial soap and water prior to dressing change. Wound Cleanser Discharge Instruction: Cleanse the wound  with wound cleanser

## 2023-01-17 ENCOUNTER — Encounter (HOSPITAL_BASED_OUTPATIENT_CLINIC_OR_DEPARTMENT_OTHER): Payer: Medicare Other | Admitting: General Surgery

## 2023-01-22 ENCOUNTER — Encounter (HOSPITAL_BASED_OUTPATIENT_CLINIC_OR_DEPARTMENT_OTHER): Payer: Medicare Other | Attending: General Surgery | Admitting: General Surgery

## 2023-01-22 DIAGNOSIS — M341 CR(E)ST syndrome: Secondary | ICD-10-CM | POA: Diagnosis not present

## 2023-01-22 DIAGNOSIS — I890 Lymphedema, not elsewhere classified: Secondary | ICD-10-CM | POA: Diagnosis not present

## 2023-01-22 DIAGNOSIS — Z7901 Long term (current) use of anticoagulants: Secondary | ICD-10-CM | POA: Insufficient documentation

## 2023-01-22 DIAGNOSIS — I48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 atrial fibrillation: Secondary | ICD-10-CM | POA: Insufficient documentation

## 2023-01-22 DIAGNOSIS — L97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 without evidence of necrosis: Secondary | ICD-10-CM | POA: Insufficient documentation

## 2023-01-22 DIAGNOSIS — L97522 Non-pressure chronic ulcer of other part of left foot with fat layer exposed: Secondary | ICD-10-CM | POA: Insufficient documentation

## 2023-01-22 DIAGNOSIS — Z8673 Personal history of transient ischemic attack (TIA), and cerebral infarction without residual deficits: Secondary | ICD-10-CM | POA: Diagnosis not present

## 2023-01-22 DIAGNOSIS — L97525 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 without evidence of necrosis: Secondary | ICD-10-CM | POA: Diagnosis not present

## 2023-01-22 DIAGNOSIS — I10 Essential (primary) hypertension: Secondary | ICD-10-CM | POA: Diagnosis not present

## 2023-01-22 NOTE — Progress Notes (Signed)
[name], [name] ([id number]) [id number] Page 1 of 17 Visit Report for 01/22/2023 Chief Complaint Document Details Patient Name: Date of Service: [name] 01/22/2023 2:15 PM Medical Record Number: [id number] Patient Account Number: [id number] Date of Birth/Sex: Treating RN: [birth year] (82 y.o. F) Primary Care Provider: [name], [facility] Other Clinician: Referring Provider: Treating Provider/Extender: [name], [facility] Weeks in Treatment: 26 Information Obtained from: Patient Chief Complaint Patient seen for complaints of Non-Healing Wound. Electronic Signature(s) Signed: 01/22/2023 3:08:30 PM By: [name] MD FACS Entered By: [name] on 01/22/2023 15:08:30 -------------------------------------------------------------------------------- Debridement Details Patient Name: Date of Service: [name], [facility] RO N 01/22/2023 2:15 PM Medical Record Number: [id number] Patient Account Number: [id number] Date of Birth/Sex: Treating RN: [birth year] (82 y.o. [name] Primary Care Provider: [name], [facility] Other Clinician: Referring Provider: Treating Provider/Extender: [name], [facility] Weeks in Treatment: 32 Debridement Performed for Assessment: Wound #10 Left T Second oe Performed By: Physician [name], MD The following information was scribed by: [name] The information was scribed for: [name] Debridement Type: Debridement Level of Consciousness (Pre-procedure): Awake and Alert Pre-procedure Verification/Time Out Yes - 14:31 Taken: Start Time: 14:31 Pain Control: Lidocaine 4% Topical Solution Percent of Wound Bed Debrided: 100% T Area Debrided (cm): otal 0.33 Tissue and other material debrided: Non-Viable, Eschar, Slough, Slough Level: Non-Viable Tissue Debridement Description: Selective/Open Wound Instrument: Curette Bleeding: Minimum Hemostasis Achieved:  Pressure Response to Treatment: Procedure was tolerated well Level of Consciousness (Post- Awake and Alert procedure): Post Debridement Measurements of Total Wound Length: (cm) 0.6 Width: (cm) 0.7 Depth: (cm) 0.1 Volume: (cm) 0.033 Character of Wound/Ulcer Post Debridement: Improved Post Procedure Diagnosis [name], [name] ([id number]) [id number] Page 2 of 17 Same as Pre-procedure Electronic Signature(s) Signed: 01/22/2023 3:13:40 PM By: [name] MD FACS Signed: 01/22/2023 3:42:47 PM By: Gelene Mink By: [name] on 01/22/2023 14:40:38 -------------------------------------------------------------------------------- Debridement Details Patient Name: Date of Service: [name], [facility] RO N 01/22/2023 2:15 PM Medical Record Number: [id number] Patient Account Number: [id number] Date of Birth/Sex: Treating RN: 02/04/1941 (82 y.o. [name] Primary Care Provider: [name], [facility] Other Clinician: Referring Provider: Treating Provider/Extender: [name], [facility] Weeks in Treatment: 32 Debridement Performed for Assessment: Wound #6 Left,Dorsal Foot Performed By: Physician [name], MD The following information was scribed by: [name] The information was scribed for: [name] Debridement Type: Debridement Level of Consciousness (Pre-procedure): Awake and Alert Pre-procedure Verification/Time Out Yes - 14:31 Taken: Start Time: 14:31 Pain Control: Lidocaine 4% Topical Solution Percent of Wound Bed Debrided: 100% T Area Debrided (cm): otal 2.36 Tissue and other material debrided: Non-Viable, Eschar, Slough, Slough Level: Non-Viable Tissue Debridement Description: Selective/Open Wound Instrument: Curette Bleeding: Minimum Hemostasis Achieved: Pressure Response to Treatment: Procedure was tolerated well Level of Consciousness (Post- Awake and Alert procedure): Post Debridement  Measurements of Total Wound Length: (cm) 1.5 Width: (cm) 2 Depth: (cm) 0.1 Volume: (cm) 0.236 Character of Wound/Ulcer Post Debridement: Improved Post Procedure Diagnosis Same as Pre-procedure Electronic Signature(s) Signed: 01/22/2023 3:13:40 PM By: [name] MD FACS Signed: 01/22/2023 3:42:47 PM By: [name] Entered By: [name] on 01/22/2023 14:40:47 -------------------------------------------------------------------------------- Debridement Details Patient Name: Date of Service: [name], [facility] RO N 01/22/2023 2:15 PM Medical Record Number: [id number] Patient Account Number: [id number] Date of Birth/Sex: Treating RN: July 03, 1940 (82 y.o. [name] Primary Care Provider: [name], [facility] Other Clinician: LAKYLA, BISWAS ([id number]) [id number] Page 3 of 17 Referring Provider: Treating Provider/Extender: [name] SHO  [name], [name] Weeks in Treatment: 32 Debridement Performed for Assessment: Wound #8 Left,Lateral Foot Performed By: Physician [name], MD The following information was scribed by: [name] The information was scribed for: [name] Debridement Type: Debridement Level of Consciousness (Pre-procedure): Awake and Alert Pre-procedure Verification/Time Out Yes - 14:31 Taken: Start Time: 14:31 Pain Control: Lidocaine 4% Topical Solution Percent of Wound Bed Debrided: 100% T Area Debrided (cm): otal 0.57 Tissue and other material debrided: Non-Viable, Eschar, Slough, Slough Level: Non-Viable Tissue Debridement Description: Selective/Open Wound Instrument: Curette Bleeding: Minimum Hemostasis Achieved: Pressure Response to Treatment: Procedure was tolerated well Level of Consciousness (Post- Awake and Alert procedure): Post Debridement Measurements of Total Wound Length: (cm) 0.9 Stage: Category/Stage IV Width: (cm) 0.8 Depth: (cm) 0.2 Volume: (cm) 0.113 Character of  Wound/Ulcer Post Debridement: Improved Post Procedure Diagnosis Same as Pre-procedure Electronic Signature(s) Signed: 01/22/2023 3:13:40 PM By: [name] MD FACS Signed: 01/22/2023 3:42:47 PM By: [name] Entered By: [name] on 01/22/2023 14:40:55 -------------------------------------------------------------------------------- Debridement Details Patient Name: Date of Service: [name], [facility] RO N 01/22/2023 2:15 PM Medical Record Number: [id number] Patient Account Number: [id number] Date of Birth/Sex: Treating RN: 1940/12/10 (82 y.o. [name] Primary Care Provider: [name], [facility] Other Clinician: Referring Provider: Treating Provider/Extender: [name], [facility] Weeks in Treatment: 32 Debridement Performed for Assessment: Wound #9 Left,Lateral T Great oe Performed By: Physician [name], MD The following information was scribed by: [name] The information was scribed for: [name] Debridement Type: Debridement Level of Consciousness (Pre-procedure): Awake and Alert Pre-procedure Verification/Time Out Yes - 14:31 Taken: Start Time: 14:31 Pain Control: Lidocaine 4% Topical Solution Percent of Wound Bed Debrided: 100% T Area Debrided (cm): otal 0.06 Tissue and other material debrided: Non-Viable, Eschar, Slough, Slough Level: Non-Viable Tissue Debridement Description: Selective/Open Wound Instrument: Curette Bleeding: Minimum Hemostasis Achieved: Pressure Response to Treatment: Procedure was tolerated well Level of Consciousness (Post- Awake and Alert [name], [name] ([id number]) [phone].pdf Page 4 of 17 Awake and Alert procedure): Post Debridement Measurements of Total Wound Length: (cm) 0.4 Width: (cm) 0.2 Depth: (cm) 0.1 Volume: (cm) 0.006 Character of Wound/Ulcer Post Debridement: Improved Post Procedure Diagnosis Same as Pre-procedure Electronic  Signature(s) Signed: 01/22/2023 3:13:40 PM By: [name] MD FACS Signed: 01/22/2023 3:42:47 PM By: [name] Entered By: [name] on 01/22/2023 14:41:06 -------------------------------------------------------------------------------- HPI Details Patient Name: Date of Service: [name], [facility] RO N 01/22/2023 2:15 PM Medical Record Number: [id number] Patient Account Number: [id number] Date of Birth/Sex: Treating RN: 06-Jul-1940 (82 y.o. F) Primary Care Provider: [name], [facility] Other Clinician: Referring Provider: Treating Provider/Extender: [name], [facility] Weeks in Treatment: 5 History of Present Illness HPI Description: ADMISSION 06/12/2022 This is an 82 year old woman with a history of CVA, crest syndrome, atrial fibrillation, rocker-bottom foot deformity. 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 She has been followed by podiatry for this and I am not entirely clear as to how she came to be referred here. She resides in an assisted living facility. It is not clear what they have been putting on her wound, but on intake, she was noted to have denuded skin on her medial third toe, as well as ulcers on her dorsal great toe and lateral great toe. There is slough accumulation on both of the toe ulcers. There was an odor noted at intake, but after her foot was washed, the odor dissipated. Her toes are folded on top of each other creating  areas of abrasion and pockets for moisture collection, which seems to be the primary cause of her ulceration. 06/20/2022: The skin between her toes and on the ball of her foot is completely macerated. There has been more tissue breakdown. The wound on her great toe has some slough accumulation. 06/27/2022: No change to her wounds today. There has been no further deterioration, but no significant improvement. She was both hypotensive and bradycardic on intake. 07/11/2022: Today, her  foot is completely macerated. She reports that the wound care nurse actually soaked her foot and then applied foam, despite our specific orders to not use foam at all. She also is draining serous fluid from both legs and has 2+ pitting edema. She is on furosemide 20 mg twice a day. 07/19/2022: Once again, her foot is completely macerated. There has been further tissue breakdown to the second and third toes and she now has ulcers on the distal ends. The wounds on her medial and lateral great toe have thick slough accumulation. Apparently Xeroform was found between her toes on intake. Edema control is improved, but still not perfect. No overt drainage from her legs appreciated on exam today. 07/27/2022: She has less tissue maceration today. Edema control in her bilateral lower extremities is improved. Still with slough accumulation on all open wound surfaces. 08/08/2022: Significant improvement this week. She has very little tissue maceration and according to her aide, there has been very little drainage from her legs. There is some slough accumulation on the medial great toe ulcer. Edema control is improved bilaterally. She is spending more time in her bed with her legs elevated and less in her wheelchair with her legs in a dependent position. 08/20/2022: The edema in her legs is now well-controlled with the use of the zinc Unna boots. Unfortunately, this seems to have resulted in more drainage coming from the open areas of her feet. They are a bit macerated but there has not been as much tissue breakdown secondary to moisture as we have seen on previous visits. 08/28/2022: The only remaining open wound in her foot is on the dorsal great toe. The improvement in the rest of the foot is quite dramatic, with no tissue maceration or breakdown. She has been elevating her legs and wearing compression wraps. Edema control is excellent and there has been no drainage from her legs. 09/05/2022: Unfortunately, the  distal half of her dorsal foot has broken down and has a layer of slough on the surface. This appears to be secondary to moisture accumulation. Her dressing was completely saturated. 09/20/2022: There has been further deterioration of her foot. The dressing that was applied was bizarre and involved Coflex foam wrapped around her foot to the ankle and then Kerlix and Coban over that to the knee. She fortunately did have silver alginate between her toes but the tissue breakdown from moisture is extensive. 09/25/2022: There has been massive improvement in her foot since last week. The maceration has decreased, edema control is markedly better, and the wounds Apollo, Cleta ([id number]) [phone].pdf Page 5 of 17 are showing evidence of healing, as opposed to worsening. 10/03/2022: The wound on her great toe is much smaller with minimal slough accumulation. The dorsal foot is also improving. She has a new ulcer on the plantar surface of her left fourth toe, however, and bone is exposed. Edema control and tissue maceration continues to be significantly better now that we are doing all of the dressing care. 10/09/2022: Unfortunately, it seems that the patient has  resumed her habit of sitting in her wheelchair with her legs in a dependent position and there has been a lot more drainage and maceration on her foot. The dorsal foot wounds have expanded and are deeper. She has a new wound on her second toe and although the initial wound on her great toe has healed, she has a new wound on the lateral aspect of her great toe, immediately adjacent to that on her second toe, suggesting these have been caused by friction of the 2 toes rubbing against each other. Her son is participating in this visit via speaker phone. 10/24/2022: Last week she had a nurse visit due to clinic capacity and it appears that her dressing was done differently by the nurse that saw her then how it is usually performed  by her regular nurse. Unfortunately, this meant there was more moisture-related tissue breakdown. The dorsum of her foot is open again and she has a new wound on the lateral aspect of her fifth toe. There is slough accumulation in each of the sites. 11/01/2022: There has been remarkable turnaround since her last visit. All of the wounds are smaller. The wound on the dorsal aspect of her great toe has closed completely. The wounds on the top of her foot have contracted and are starting to epithelialize. The new wound on the lateral aspect of her fifth toe does have some tendon exposure today that was not appreciated at her last visit. There is more tissue over the exposed bone on the plantar surface of her fourth toe, although bone does still remain exposed. 11/08/2022: All of her wounds are looking better again this week. The exposed bone on the plantar surface of the fourth toe has now been covered with soft tissue. The dorsal foot wounds are epithelializing nicely. The lateral fifth toe wound is smaller. There is slough on all of the surfaces. She had ABIs done in the vascular lab last week. Results are copied here: ABI Findings: +---------+------------------+-----+----------+--------+ Right Rt Pressure (mmHg)IndexWaveform Comment  +---------+------------------+-----+----------+--------+ Brachial 132     +---------+------------------+-----+----------+--------+ PTA 108 0.82 monophasic  +---------+------------------+-----+----------+--------+ DP 81 0.61 monophasic  +---------+------------------+-----+----------+--------+ Great T oe24 0.18 Abnormal   +---------+------------------+-----+----------+--------+ +---------+------------------+-----+----------+--------------------------------+ Left Lt Pressure (mmHg)IndexWaveform Comment  +---------+------------------+-----+----------+--------------------------------+ Brachial 121      +---------+------------------+-----+----------+--------------------------------+ PTA 88 0.67 monophasic  +---------+------------------+-----+----------+--------------------------------+ DP 111 0.84 monophasic  +---------+------------------+-----+----------+--------------------------------+ Great T   unable to obtain due to  oe     bandaging/ wound  +---------+------------------+-----+----------+--------------------------------+ +-------+-----------+-----------+------------+------------+ ABI/TBIT oday's ABIT oday's TBIPrevious ABIPrevious TBI +-------+-----------+-----------+------------+------------+ Right 0.82 0.18    +-------+-----------+-----------+------------+------------+ Left 0.84     +-------+-----------+-----------+------------+------------+ Summary: Right: Resting right ankle-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 The right toe-brachial index is abnormal. Left: Resting left ankle-brachial index indicates mild left lower extremity arterial disease. Unable to obtain TBI due to bandaging/wound. *See table(s) above for measurements and observations. 11/16/2022: All of her wounds are improving. The wound on the plantar surface of the fourth toe has good tissue covering the bone. The dorsal foot areas are epithelializing. The lateral foot ulcer still has muscle exposed; this is probably the most severe of her wounds at this time. She is scheduled to see vascular surgery on August 20. 11/20/2022: The wound on the plantar surface of the fourth toe seems to be closed. The dorsal foot areas are smaller. The lateral foot ulcer has muscle and tendon exposed. The medial great toe ulcer has bone exposed and the lateral great toe ulcer just has some slough on the surface. Unfortunately,  the dorsal second toe ulcer now also has tendon exposure. 12/06/2022: The dorsal foot areas continue to contract. The lateral foot ulcer  has some tendon exposure, but there is no longer any necrotic muscle or other nonviable soft tissue. The medial great toe ulcer is about the same with bone exposure. The dorsal second toe ulcer also has bone exposure. She has a new wound on her right anterior tibial surface. The fat layer is exposed but it is fairly small. It is clean without any slough or eschar accumulation. 12/27/2022: I have not seen this patient in 3 weeks; she had a nurse visit 1 week after the last time I saw her and then has been unable to attend the subsequent visits for various reasons. She did undergo angiography on August 23. 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 There were no options for revascularization. Her wraps were on for 2 weeks and were fairly soupy when they were removed. The plantar fourth toe wound has reopened with bone exposure. The rest of the wounds are all about the same. 01/04/2023: No significant change to any of the wounds. 01/16/2023: Her wounds are all about the same. The dressings were on for almost 2 weeks and were little bit soupy when removed. 01/22/2023: The wounds are basically unchanged, but they were not as soupy as they were last week when her dressings were removed. There is slough [name], [name] ([id number]) [phone].pdf Page 6 of 17 accumulation at all of the surfaces. Bone remains exposed on the toe ulcers. There is no real necrosis visible at the lateral foot ulcer. The patient and her aide report that after discussion with the patient's son, the patient feels like she is interested in pursuing a surgical option with Dr. Lenell Antu. Electronic Signature(s) Signed: 01/22/2023 3:10:12 PM By: [name] MD FACS Entered By: [name] on 01/22/2023  15:10:12 -------------------------------------------------------------------------------- Physical Exam Details Patient Name: Date of Service: [name] [facility] RO N 01/22/2023 2:15 PM Medical Record Number: [id number] Patient Account Number: [id number] Date of Birth/Sex: Treating RN: 1941/04/23 (82 y.o. F) Primary Care Provider: [name], [facility] Other Clinician: Referring Provider: Treating Provider/Extender: [name] SHO [name], [facility] Weeks in Treatment: 32 Constitutional . . . . no acute distress. Respiratory Normal work of breathing on room air. Notes 01/22/2023: The wounds are basically unchanged, but they were not as soupy as they were last week when her dressings were removed. There is slough accumulation at all of the surfaces. Bone remains exposed on the toe ulcers. There is no real necrosis visible at the lateral foot ulcer. Electronic Signature(s) Signed: 01/22/2023 3:10:46 PM By: [name] MD FACS Entered By: [name] on 01/22/2023 15:10:46 -------------------------------------------------------------------------------- Physician Orders Details Patient Name: Date of Service: [name], [facility] RO N 01/22/2023 2:15 PM Medical Record Number: [id number] Patient Account Number: [id number] Date of Birth/Sex: Treating RN: 09-11-1940 (82 y.o. 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 - Dr. Lady Gary - room 2 Return A Other: - call Dr. Lenell Antu to discuss further about your foot. Anesthetic (In clinic) Topical Lidocaine 4% applied to wound bed - USED in Clinic Bathing/ Shower/ Hygiene May shower with protection but do not get wound dressing(s) wet. Protect dressing(s) with water repellant cover (for example, large plastic bag) or a cast cover and may then take shower. Edema Control - Lymphedema / SCD / Other Elevate legs to the level of the heart or above for 30 minutes daily and/or when sitting for 3-4 times a day throughout the day. Avoid standing for long periods of time. Non Wound Condition Right Lower Extremity Other Non Wound Condition Orders/Instructions: - urgo lite or 3 layer compression wrap on right and left lower leg to be changed by facility Wound Treatment Wound #10 - T Second oe Wound Laterality: Left Cleanser: Soap and Water 1 x Per Week/30 Days Discharge Instructions: May shower and wash wound with dial antibacterial soap and water prior to dressing change. Cleanser: Wound Cleanser 1 x Per Week/30 Days Discharge Instructions: Cleanse the wound with wound cleanser prior to applying a clean dressing using gauze sponges, not tissue or cotton balls. Peri-Wound Care: Sween Lotion (Moisturizing lotion) 1 x Per Week/30 Days Discharge Instructions: Apply moisturizing lotion as directed Topical: Gentamicin 1 x Per Week/30 Days Discharge Instructions: As directed by physician Topical: Mupirocin Ointment 1 x Per Week/30  Days Discharge Instructions: Apply Mupirocin (Bactroban) as instructed Topical: Ketoconazole Cream 2% 1 x Per Week/30 Days Discharge Instructions: Apply Ketoconazole as directed Topical: zinc 1 x Per Week/30 Days Prim Dressing: Endoform 2x2 in 1 x Per Week/30 Days ary Discharge Instructions: Moisten with saline Secondary Dressing: Drawtex 4x4 in 1 x Per Week/30 Days Discharge Instructions: Apply over primary dressing as directed. Secondary Dressing: Woven Gauze Sponge, Non-Sterile 4x4 in 1 x Per Week/30 Days Discharge Instructions: can use gauze between toes (if no desired) Secondary Dressing: Zetuvit Plus 4x8 in 1 x Per Week/30 Days Discharge Instructions: Apply over primary dressing as directed. Compression Wrap: Urgo K2 Lite, (equivalent to a 3 layer) two layer compression system, regular 1 x Per Week/30 Days Discharge Instructions: Apply Urgo K2 Lite as directed (alternative to 3 layer compression). Wound #6 - Foot Wound Laterality: Dorsal, Left Cleanser: Soap and Water 1 x Per Week/30 Days Discharge Instructions: May shower and wash wound with dial antibacterial soap and water prior to dressing change. Cleanser: Wound Cleanser 1 x Per Week/30 Days Discharge Instructions: Cleanse the wound with wound cleanser prior to applying a clean dressing using gauze sponges, not tissue or cotton balls. Peri-Wound Care: Sween Lotion (Moisturizing lotion) 1 x Per Week/30 Days Discharge Instructions: Apply moisturizing lotion as directed Topical: Gentamicin 1 x Per Week/30 Days Discharge Instructions: As directed by physician Topical: Mupirocin Ointment 1 x Per Week/30 Days Discharge Instructions: Apply Mupirocin (Bactroban) as instructed Topical: Ketoconazole Cream 2% 1 x Per Week/30 Days Discharge Instructions: Apply Ketoconazole as directed Topical: zinc 1 x Per Week/30 Days [name], [name] ([id number]) [phone].pdf Page 8 of 17 Prim Dressing: Maxorb Extra Ag+  Alginate Dressing, 4x4.75 (in/in) 1 x Per  Week/30 Days ary Discharge Instructions: Apply to wound bed as instructed Secondary Dressing: Drawtex 4x4 in 1 x Per Week/30 Days Discharge Instructions: Apply over primary dressing as directed. Secondary Dressing: Woven Gauze Sponge, Non-Sterile 4x4 in 1 x Per Week/30 Days Discharge Instructions: can use gauze between toes (if no desired) Secondary Dressing: Zetuvit Plus 4x8 in 1 x Per Week/30 Days Discharge Instructions: Apply over primary dressing as directed. Compression Wrap: Urgo K2 Lite, (equivalent to a 3 layer) two layer compression system, regular 1 x Per Week/30 Days Discharge Instructions: Apply Urgo K2 Lite as directed (alternative to 3 layer compression). Wound #8 - Foot Wound Laterality: Left, Lateral Cleanser: Soap and Water 1 x Per Week/30 Days Discharge Instructions: May shower and wash wound with dial antibacterial soap and water prior to dressing change. Cleanser: Wound Cleanser 1 x Per Week/30 Days Discharge Instructions: Cleanse the wound with wound cleanser prior to applying a clean dressing using gauze sponges, not tissue or cotton balls. Peri-Wound Care: Sween Lotion (Moisturizing lotion) 1 x Per Week/30 Days Discharge Instructions: Apply moisturizing lotion as directed Topical: Gentamicin 1 x Per Week/30 Days Discharge Instructions: As directed by physician Topical: Mupirocin Ointment 1 x Per Week/30 Days Discharge Instructions: Apply Mupirocin (Bactroban) as instructed Topical: Ketoconazole Cream 2% 1 x Per Week/30 Days Discharge Instructions: Apply Ketoconazole as directed Topical: zinc 1 x Per Week/30 Days Prim Dressing: Endoform 2x2 in 1 x Per Week/30 Days ary Discharge Instructions: Moisten with saline Secondary Dressing: Drawtex 4x4 in 1 x Per Week/30 Days Discharge Instructions: Apply over primary dressing as directed. Secondary Dressing: Woven Gauze Sponge, Non-Sterile 4x4 in 1 x Per Week/30 Days Discharge  Instructions: can use gauze between toes (if no desired) Secondary Dressing: Zetuvit Plus 4x8 in 1 x Per Week/30 Days Discharge Instructions: Apply over primary dressing as directed. Compression Wrap: Urgo K2 Lite, (equivalent to a 3 layer) two layer compression system, regular 1 x Per Week/30 Days Discharge Instructions: Apply Urgo K2 Lite as directed (alternative to 3 layer compression). Wound #9 - T Great oe Wound Laterality: Left, Lateral Cleanser: Soap and Water 1 x Per Week/30 Days Discharge Instructions: May shower and wash wound with dial antibacterial soap and water prior to dressing change. Cleanser: Wound Cleanser 1 x Per Week/30 Days Discharge Instructions: Cleanse the wound with wound cleanser prior to applying a clean dressing using gauze sponges, not tissue or cotton balls. Peri-Wound Care: Sween Lotion (Moisturizing lotion) 1 x Per Week/30 Days Discharge Instructions: Apply moisturizing lotion as directed Topical: Gentamicin 1 x Per Week/30 Days Discharge Instructions: As directed by physician Topical: Mupirocin Ointment 1 x Per Week/30 Days Discharge Instructions: Apply Mupirocin (Bactroban) as instructed Topical: Ketoconazole Cream 2% 1 x Per Week/30 Days Discharge Instructions: Apply Ketoconazole as directed Topical: zinc 1 x Per Week/30 Days Prim Dressing: Endoform 2x2 in 1 x Per Week/30 Days ary Discharge Instructions: Moisten with saline Secondary Dressing: Drawtex 4x4 in 1 x Per Week/30 Days Discharge Instructions: Apply over primary dressing as directed. ANITRIA, ANDON ([id number]) [id number] Page 9 of 17 Secondary Dressing: Woven Gauze Sponge, Non-Sterile 4x4 in 1 x Per Week/30 Days Discharge Instructions: can use gauze between toes (if no desired) Secondary Dressing: Zetuvit Plus 4x8 in 1 x Per Week/30 Days Discharge Instructions: Apply over primary dressing as directed. Compression Wrap: Urgo K2 Lite, (equivalent to a 3 layer) two  layer compression system, regular 1 x Per Week/30 Days Discharge Instructions: Apply Urgo K2 Lite as directed (alternative to 3 layer compression). Patient Medications  llergies: Sulfa (Sulfonamide Antibiotics) A Notifications Medication Indication Start End 01/22/2023 lidocaine DOSE topical 4 % cream - cream topical Electronic Signature(s) Signed: 01/22/2023 3:13:40 PM By: [name] MD FACS Entered By: [name] on 01/22/2023 15:11:11 -------------------------------------------------------------------------------- Problem List Details Patient Name: Date of Service: [name], [facility] RO N 01/22/2023 2:15 PM Medical Record Number: [id number] Patient Account Number: [id number] Date of Birth/Sex: Treating RN: 02/21/1941 (82 y.o. 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 11/01/2022 No Yes without evidence of necrosis 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 06/12/2022 No Yes I10 Essential (primary) hypertension 06/12/2022 No Yes 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 06/12/2022 No Yes 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 atrial fibrillation 06/12/2022 No Yes Brunette, [name] ([id number]) [phone].pdf Page 10 of 17 Z79.01 Long term (current) use of anticoagulants 06/12/2022 No Yes I89.0 Lymphedema, not elsewhere classified 08/20/2022 No Yes Inactive Problems ICD-10 Code Description Active Date Inactive Date L97.521 Non-pressure chronic ulcer of  other part of left foot limited to breakdown of skin 06/12/2022 06/12/2022 Resolved Problems ICD-10 Code Description Active Date Resolved Date L97.812 Non-pressure chronic ulcer of other part of right lower leg with fat layer exposed 12/06/2022 12/06/2022 Electronic Signature(s) Signed: 01/22/2023 3:06:22 PM By: [name] MD FACS Entered By: [name] on 01/22/2023 15:06:22 -------------------------------------------------------------------------------- Progress Note Details Patient Name: Date of Service: [name], [facility] RO N 01/22/2023 2:15 PM Medical Record Number: [id number] Patient Account Number: [id number] Date of Birth/Sex: Treating RN: Jul 19, 1940 (82 y.o. F) Primary Care Provider: [name], [facility] Other Clinician: Referring Provider: Treating Provider/Extender: [name], [facility] Weeks in Treatment: 25 Subjective Chief Complaint Information obtained from Patient Patient seen for complaints of Non-Healing Wound. History of Present Illness (HPI) ADMISSION 06/12/2022 This is an 82 year old woman with a history of CVA, crest syndrome, atrial fibrillation, rocker-bottom foot deformity. 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 She has been followed by podiatry for this and I am not entirely clear as to how she came to be referred here. She resides in an assisted living facility. It is not clear what they have been putting on her wound, but on intake, she was noted to have denuded skin on her medial third toe, as well as ulcers on her dorsal great toe and lateral great toe. There is slough accumulation on both of the toe ulcers. There was an odor noted at intake, but after her foot was washed, the odor dissipated. Her toes are folded on top of each other creating areas of abrasion and pockets for moisture collection, which seems to be the primary cause of her ulceration. 06/20/2022: The skin between her toes and on the  ball of her foot is completely macerated. There has been more tissue breakdown. The wound on her great toe has some slough accumulation. 06/27/2022: No change to her wounds today. There has been no further deterioration, but no significant improvement. She was both hypotensive and bradycardic on intake. 07/11/2022: Today, her foot is completely macerated. She reports that the wound care nurse actually soaked her foot  and then applied foam, despite our specific orders to not use foam at all. She also is draining serous fluid from both legs and has 2+ pitting edema. She is on furosemide 20 mg twice a day. 07/19/2022: Once again, her foot is completely macerated. There has been further tissue breakdown to the second and third toes and she now has ulcers on the distal ends. The wounds on her medial and lateral great toe have thick slough accumulation. Apparently Xeroform was found between her toes on intake. Edema control is improved, but still not perfect. No overt drainage from her legs appreciated on exam today. 07/27/2022: She has less tissue maceration today. Edema control in her bilateral lower extremities is improved. Still with slough accumulation on all open wound surfaces. KITARA, HEBB ([id number]) [id number] Page 11 of 17 08/08/2022: Significant improvement this week. She has very little tissue maceration and according to her aide, there has been very little drainage from her legs. There is some slough accumulation on the medial great toe ulcer. Edema control is improved bilaterally. She is spending more time in her bed with her legs elevated and less in her wheelchair with her legs in a dependent position. 08/20/2022: The edema in her legs is now well-controlled with the use of the zinc Unna boots. Unfortunately, this seems to have resulted in more drainage coming from the open areas of her feet. They are a bit macerated but there has not been as much tissue breakdown  secondary to moisture as we have seen on previous visits. 08/28/2022: The only remaining open wound in her foot is on the dorsal great toe. The improvement in the rest of the foot is quite dramatic, with no tissue maceration or breakdown. She has been elevating her legs and wearing compression wraps. Edema control is excellent and there has been no drainage from her legs. 09/05/2022: Unfortunately, the distal half of her dorsal foot has broken down and has a layer of slough on the surface. This appears to be secondary to moisture accumulation. Her dressing was completely saturated. 09/20/2022: There has been further deterioration of her foot. The dressing that was applied was bizarre and involved Coflex foam wrapped around her foot to the ankle and then Kerlix and Coban over that to the knee. She fortunately did have silver alginate between her toes but the tissue breakdown from moisture is extensive. 09/25/2022: There has been massive improvement in her foot since last week. The maceration has decreased, edema control is markedly better, and the wounds are showing evidence of healing, as opposed to worsening. 10/03/2022: The wound on her great toe is much smaller with minimal slough accumulation. The dorsal foot is also improving. She has a new ulcer on the plantar surface of her left fourth toe, however, and bone is exposed. Edema control and tissue maceration continues to be significantly better now that we are doing all of the dressing care. 10/09/2022: Unfortunately, it seems that the patient has resumed her habit of sitting in her wheelchair with her legs in a dependent position and there has been a lot more drainage and maceration on her foot. The dorsal foot wounds have expanded and are deeper. She has a new wound on her second toe and although the initial wound on her great toe has healed, she has a new wound on the lateral aspect of her great toe, immediately adjacent to that on her second  toe, suggesting these have been caused by friction of the 2 toes rubbing against each other.  Her son is participating in this visit via speaker phone. 10/24/2022: Last week she had a nurse visit due to clinic capacity and it appears that her dressing was done differently by the nurse that saw her then how it is usually performed by her regular nurse. Unfortunately, this meant there was more moisture-related tissue breakdown. The dorsum of her foot is open again and she has a new wound on the lateral aspect of her fifth toe. There is slough accumulation in each of the sites. 11/01/2022: There has been remarkable turnaround since her last visit. All of the wounds are smaller. The wound on the dorsal aspect of her great toe has closed completely. The wounds on the top of her foot have contracted and are starting to epithelialize. The new wound on the lateral aspect of her fifth toe does have some tendon exposure today that was not appreciated at her last visit. There is more tissue over the exposed bone on the plantar surface of her fourth toe, although bone does still remain exposed. 11/08/2022: All of her wounds are looking better again this week. The exposed bone on the plantar surface of the fourth toe has now been covered with soft tissue. The dorsal foot wounds are epithelializing nicely. The lateral fifth toe wound is smaller. There is slough on all of the surfaces. She had ABIs done in the vascular lab last week. Results are copied here: ABI Findings: +---------+------------------+-----+----------+--------+ Right Rt Pressure (mmHg)IndexWaveform Comment  +---------+------------------+-----+----------+--------+ Brachial 132     +---------+------------------+-----+----------+--------+ PTA 108 0.82 monophasic  +---------+------------------+-----+----------+--------+ DP 81 0.61 monophasic  +---------+------------------+-----+----------+--------+ Great T oe24 0.18  Abnormal   +---------+------------------+-----+----------+--------+ +---------+------------------+-----+----------+--------------------------------+ Left Lt Pressure (mmHg)IndexWaveform Comment  +---------+------------------+-----+----------+--------------------------------+ Brachial 121     +---------+------------------+-----+----------+--------------------------------+ PTA 88 0.67 monophasic  +---------+------------------+-----+----------+--------------------------------+ DP 111 0.84 monophasic  +---------+------------------+-----+----------+--------------------------------+ Great T   unable to obtain due to  oe     bandaging/ wound  +---------+------------------+-----+----------+--------------------------------+ +-------+-----------+-----------+------------+------------+ ABI/TBIT oday's ABIT oday's TBIPrevious ABIPrevious TBI +-------+-----------+-----------+------------+------------+ Right 0.82 0.18    +-------+-----------+-----------+------------+------------+ Left 0.84     +-------+-----------+-----------+------------+------------+ Summary: Right: Resting right ankle-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 The right toe-brachial index is abnormal. Left: Resting left ankle-brachial index indicates mild left lower extremity arterial disease. Unable to obtain TBI due to bandaging/wound. *See table(s) above for measurements and observations. 11/16/2022: All of her wounds are improving. The wound on the plantar surface of the fourth toe has good tissue covering the bone. The dorsal foot areas are epithelializing. The lateral foot ulcer still has muscle exposed; this is probably the most severe of her wounds at this time. She is scheduled to see vascular [name], [name] ([id number]) [id number] Page 12 of 17 surgery on August 20. 11/20/2022: The wound on the plantar surface  of the fourth toe seems to be closed. The dorsal foot areas are smaller. The lateral foot ulcer has muscle and tendon exposed. The medial great toe ulcer has bone exposed and the lateral great toe ulcer just has some slough on the surface. Unfortunately, the dorsal second toe ulcer now also has tendon exposure. 12/06/2022: The dorsal foot areas continue to contract. The lateral foot ulcer has some tendon exposure, but there is no longer any necrotic muscle or other nonviable soft tissue. The medial great toe ulcer is about the same with bone exposure. The dorsal second toe ulcer also has bone exposure. She has a new wound on her right anterior tibial surface. The fat layer is exposed but it is fairly small. It is clean without  any slough or eschar accumulation. 12/27/2022: I have not seen this patient in 3 weeks; she had a nurse visit 1 week after the last time I saw her and then has been unable to attend the subsequent visits for various reasons. She did undergo angiography on August 23. 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 There were no options for revascularization. Her wraps were on for 2 weeks and were fairly soupy when they were removed. The plantar fourth toe wound has reopened with bone exposure. The rest of the wounds are all about the same. 01/04/2023: No significant change to any of the wounds. 01/16/2023: Her wounds are all about the same. The dressings were on for almost 2 weeks and were little bit soupy when removed. 01/22/2023: The wounds are basically unchanged, but they were not as soupy as they were last week when her dressings were removed. There is slough accumulation at all of the surfaces. Bone remains exposed on the toe ulcers. There is no real necrosis visible at the lateral foot ulcer. The patient  and her aide report that after discussion with the patient's son, the patient feels like she is interested in pursuing a surgical option with Dr. Lenell Antu. Patient History Information obtained from Patient. Family History Unknown History. Social History Former smoker - smoked when she was young, Marital Status - Widowed, Alcohol Use - Never, Drug Use - No History, Caffeine Use - Daily. Medical History Hematologic/Lymphatic Patient has history of Anemia Cardiovascular Patient has history of Angina - a-fib, Hypertension, Vasculitis Endocrine Denies history of Type I Diabetes, Type II Diabetes Musculoskeletal Patient has history of Osteoarthritis Hospitalization/Surgery History - cholecystectomy. - leg surgery. - tonsillectomy. 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 Vitals Time Taken: 2:12 PM, Height: 67 in, Weight: 153 lbs, BMI: 24, Temperature: 97.7 F, Pulse: 90 bpm, Respiratory Rate: 18 breaths/min, Blood Pressure: 125/67 mmHg. Respiratory Normal work of breathing on room air. General Notes: 01/22/2023: The wounds are basically unchanged, but they were not as soupy as they were last week when her dressings were removed. There is slough accumulation at all of the surfaces. Bone remains exposed on the toe ulcers. There is no real necrosis visible at the lateral foot ulcer. Integumentary (Hair, Skin) Wound #10 status is Open. Original cause of wound was Gradually Appeared. The date acquired was: 09/05/2022. The wound has been in treatment 6 weeks. The wound is located on the Left T Second. The wound measures 0.6cm length x 0.7cm width x 0.1cm depth; 0.33cm^2 area and 0.033cm^3 volume. There is oe Fat Layer (Subcutaneous Tissue) exposed. There is no tunneling or undermining noted. There is a medium amount of serosanguineous drainage  noted. The wound margin is distinct with the outline attached to the wound base. There is small (1-33%) pink granulation within the wound bed. There is a large (67-100%) amount of necrotic tissue within the wound bed including Adherent Slough. The periwound skin appearance had no abnormalities noted for texture. The periwound skin appearance had no abnormalities noted for moisture. The periwound skin appearance had no abnormalities noted for color. Periwound temperature was noted as No Abnormality. The periwound has tenderness on palpation. [name], [name] ([id number]) [id number] Page 13 of 17 Wound #6 status is Open. Original cause of wound was Gradually  Appeared. The date acquired was: 09/05/2022. The wound has been in treatment 19 weeks. The wound is located on the Left,Dorsal Foot. The wound measures 1.5cm length x 2cm width x 0.1cm depth; 2.356cm^2 area and 0.236cm^3 volume. There is Fat Layer (Subcutaneous Tissue) exposed. There is no tunneling or undermining noted. There is a medium amount of serosanguineous drainage noted. The wound margin is indistinct and nonvisible. There is small (1-33%) pink granulation within the wound bed. There is a large (67-100%) amount of necrotic tissue within the wound bed including Adherent Slough. The periwound skin appearance had no abnormalities noted for texture. The periwound skin appearance had no abnormalities noted for moisture. The periwound skin appearance exhibited: Rubor. Periwound temperature was noted as No Abnormality. The periwound has tenderness on palpation. Wound #8 status is Open. Original cause of wound was Pressure Injury. The date acquired was: 10/24/2022. The wound has been in treatment 12 weeks. The wound is located on the Left,Lateral Foot. The wound measures 0.9cm length x 0.8cm width x 0.2cm depth; 0.565cm^2 area and 0.113cm^3 volume. There is Fat Layer (Subcutaneous Tissue) exposed. There is no tunneling or  undermining noted. There is a medium amount of serosanguineous drainage noted. The wound margin is distinct with the outline attached to the wound base. There is small (1-33%) pink granulation within the wound bed. There is a large (67-100%) amount of necrotic tissue within the wound bed including Adherent Slough. The periwound skin appearance had no abnormalities noted for texture. The periwound skin appearance had no abnormalities noted for moisture. The periwound skin appearance had no abnormalities noted for color. Periwound temperature was noted as No Abnormality. The periwound has tenderness on palpation. Wound #9 status is Open. Original cause of wound was Gradually Appeared. The date acquired was: 11/01/2022. The wound has been in treatment 11 weeks. The wound is located on the [employer]. The wound measures 0.4cm length x 0.2cm width x 0.1cm depth; 0.063cm^2 area and 0.006cm^3 volume. There oe is bone and Fat Layer (Subcutaneous Tissue) exposed. There is no tunneling or undermining noted. There is a small amount of purulent drainage noted. The wound margin is flat and intact. There is small (1-33%) pink granulation within the wound bed. There is a large (67-100%) amount of necrotic tissue within the wound bed including Adherent Slough. The periwound skin appearance had no abnormalities noted for texture. The periwound skin appearance had no abnormalities noted for moisture. The periwound skin appearance had no abnormalities noted for color. Periwound temperature was noted as No Abnormality. The periwound has tenderness on palpation. 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 There was a Selective/Open Wound Non-Viable Tissue Debridement oe with a total area of 0.33 sq cm performed by [name], MD. With the following instrument(s): Curette to remove Non-Viable tissue/material. Material removed includes Eschar and Slough and after achieving pain control using Lidocaine 4% T opical Solution. No specimens were taken. A time out was conducted at 14:31, prior to the start  of the procedure. A Minimum amount of bleeding was controlled with Pressure. The procedure was tolerated well. Post Debridement Measurements: 0.6cm length x 0.7cm width x 0.1cm depth; 0.033cm^3 volume. Character of Wound/Ulcer Post Debridement is improved. Post procedure Diagnosis Wound #10: Same as Pre-Procedure Wound #6 Pre-procedure diagnosis of Wound #6 is a Lymphedema located on the Left,Dorsal Foot . There was a Selective/Open Wound Non-Viable Tissue Debridement with a total area of 2.36 sq cm performed by [name], MD. With the following instrument(s): Curette to remove Non-Viable tissue/material. Material removed includes Eschar and Slough and after achieving pain control using Lidocaine 4% T opical Solution. No specimens were taken. A time out was conducted at 14:31, prior to the start of the procedure. A Minimum amount of bleeding was controlled with Pressure. The procedure was tolerated well. Post Debridement Measurements: 1.5cm length x 2cm width x 0.1cm depth; 0.236cm^3 volume. Character of Wound/Ulcer Post Debridement is improved. Post procedure Diagnosis Wound #6: Same as Pre-Procedure Pre-procedure diagnosis of Wound #6 is a Lymphedema located on the Left,Dorsal  Foot . There was a Double Layer Compression Therapy Procedure by [name], RN. 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 There was a Selective/Open Wound Non-Viable Tissue Debridement with a total area of 0.57 sq cm performed by [name], MD. With the following instrument(s): Curette to remove Non-Viable tissue/material. Material removed includes Eschar and Slough and after achieving pain control using Lidocaine 4% T opical Solution. No specimens were taken. A time out was conducted at 14:31, prior to the start of the procedure. A Minimum amount of bleeding was controlled with Pressure. The procedure was tolerated well. Post Debridement Measurements: 0.9cm length x 0.8cm width x 0.2cm depth; 0.113cm^3 volume. Post debridement Stage noted as Category/Stage IV. Character of Wound/Ulcer Post Debridement is improved. 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 There was a Selective/Open Wound Non-Viable Tissue oe Debridement with a total area of 0.06 sq cm performed by [name], MD. With the following instrument(s): Curette to remove Non-Viable tissue/material. Material removed includes Eschar and Slough and after achieving pain control using Lidocaine 4% Topical Solution. No specimens were taken. A time out was conducted at 14:31, prior to the start of the procedure. A Minimum amount of bleeding was controlled with Pressure. The procedure was tolerated well. Post Debridement Measurements: 0.4cm length x 0.2cm width x 0.1cm depth; 0.006cm^3 volume. [name], [name] ([id number]) [id number] Page 14 of 17 Character of Wound/Ulcer Post Debridement is improved. Post procedure Diagnosis Wound #9: Same as  Pre-Procedure Plan Follow-up Appointments: Return Appointment in 1 week. - Dr. Lady Gary - room 2 Other: - call Dr. Lenell Antu to discuss further about your foot. Anesthetic: (In clinic) Topical Lidocaine 4% applied to wound bed - USED in Clinic Bathing/ Shower/ Hygiene: May shower with protection but do not get wound dressing(s) wet. Protect dressing(s) with water repellant cover (for example, large plastic bag) or a cast cover and may then take shower. Edema Control - Lymphedema / SCD / Other: Elevate legs to the level of the heart or above for 30 minutes daily and/or when sitting for 3-4 times a day throughout the day. Avoid standing for long periods of time. Non Wound Condition: Other Non Wound Condition Orders/Instructions: - urgo lite or 3 layer compression wrap on right and left  lower leg to be changed by facility The following medication(s) was prescribed: lidocaine topical 4 % cream cream topical was prescribed at facility WOUND #10: - T Second Wound Laterality: Left oe Cleanser: Soap and Water 1 x Per Week/30 Days Discharge Instructions: May shower and wash wound with dial antibacterial soap and water prior to dressing change. Cleanser: Wound Cleanser 1 x Per Week/30 Days Discharge Instructions: Cleanse the wound with wound cleanser prior to applying a clean dressing using gauze sponges, not tissue or cotton balls. Peri-Wound Care: Sween Lotion (Moisturizing lotion) 1 x Per Week/30 Days Discharge Instructions: Apply moisturizing lotion as directed Topical: Gentamicin 1 x Per Week/30 Days Discharge Instructions: As directed by physician Topical: Mupirocin Ointment 1 x Per Week/30 Days Discharge Instructions: Apply Mupirocin (Bactroban) as instructed Topical: Ketoconazole Cream 2% 1 x Per Week/30 Days Discharge Instructions: Apply Ketoconazole as directed Topical: zinc 1 x Per Week/30 Days Prim Dressing: Endoform 2x2 in 1 x Per Week/30 Days ary Discharge Instructions: Moisten with  saline Secondary Dressing: Drawtex 4x4 in 1 x Per Week/30 Days Discharge Instructions: Apply over primary dressing as directed. Secondary Dressing: Woven Gauze Sponge, Non-Sterile 4x4 in 1 x Per Week/30 Days Discharge Instructions: can use gauze between toes (if no desired) Secondary Dressing: Zetuvit Plus 4x8 in 1 x Per Week/30 Days Discharge Instructions: Apply over primary dressing as directed. Com pression Wrap: Urgo K2 Lite, (equivalent to a 3 layer) two layer compression system, regular 1 x Per Week/30 Days Discharge Instructions: Apply Urgo K2 Lite as directed (alternative to 3 layer compression). WOUND #6: - Foot Wound Laterality: Dorsal, Left Cleanser: Soap and Water 1 x Per Week/30 Days Discharge Instructions: May shower and wash wound with dial antibacterial soap and water prior to dressing change. Cleanser: Wound Cleanser 1 x Per Week/30 Days Discharge Instructions: Cleanse the wound with wound cleanser prior to applying a clean dressing using gauze sponges, not tissue or cotton balls. Peri-Wound Care: Sween Lotion (Moisturizing lotion) 1 x Per Week/30 Days Discharge Instructions: Apply moisturizing lotion as directed Topical: Gentamicin 1 x Per Week/30 Days Discharge Instructions: As directed by physician Topical: Mupirocin Ointment 1 x Per Week/30 Days Discharge Instructions: Apply Mupirocin (Bactroban) as instructed Topical: Ketoconazole Cream 2% 1 x Per Week/30 Days Discharge Instructions: Apply Ketoconazole as directed Topical: zinc 1 x Per Week/30 Days Prim Dressing: Maxorb Extra Ag+ Alginate Dressing, 4x4.75 (in/in) 1 x Per Week/30 Days ary Discharge Instructions: Apply to wound bed as instructed Secondary Dressing: Drawtex 4x4 in 1 x Per Week/30 Days Discharge Instructions: Apply over primary dressing as directed. Secondary Dressing: Woven Gauze Sponge, Non-Sterile 4x4 in 1 x Per Week/30 Days Discharge Instructions: can use gauze between toes (if no  desired) Secondary Dressing: Zetuvit Plus 4x8 in 1 x Per Week/30 Days Discharge Instructions: Apply over primary dressing as directed. Com pression Wrap: Urgo K2 Lite, (equivalent to a 3 layer) two layer compression system, regular 1 x Per Week/30 Days Discharge Instructions: Apply Urgo K2 Lite as directed (alternative to 3 layer compression). WOUND #8: - Foot Wound Laterality: Left, Lateral Cleanser: Soap and Water 1 x Per Week/30 Days Discharge Instructions: May shower and wash wound with dial antibacterial soap and water prior to dressing change. Cleanser: Wound Cleanser 1 x Per Week/30 Days Discharge Instructions: Cleanse the wound with wound cleanser prior to applying a clean dressing using gauze sponges, not tissue or cotton balls. Peri-Wound Care: Sween Lotion (Moisturizing lotion) 1 x Per Week/30 Days Discharge Instructions: Apply moisturizing lotion as directed Topical:  Gentamicin 1 x Per Week/30 Days Discharge Instructions: As directed by physician Topical: Mupirocin Ointment 1 x Per Week/30 Days Discharge Instructions: Apply Mupirocin (Bactroban) as instructed Topical: Ketoconazole Cream 2% 1 x Per Week/30 Days Discharge Instructions: Apply Ketoconazole as directed Topical: zinc 1 x Per Week/30 Days Prim Dressing: Endoform 2x2 in 1 x Per Week/30 Days ary Discharge Instructions: Moisten with saline Secondary Dressing: Drawtex 4x4 in 1 x Per Week/30 Days Discharge Instructions: Apply over primary dressing as directed. [name], [name] ([id number]) [id number] Page 15 of 17 Secondary Dressing: Woven Gauze Sponge, Non-Sterile 4x4 in 1 x Per Week/30 Days Discharge Instructions: can use gauze between toes (if no desired) Secondary Dressing: Zetuvit Plus 4x8 in 1 x Per Week/30 Days Discharge Instructions: Apply over primary dressing as directed. Com pression Wrap: Urgo K2 Lite, (equivalent to a 3 layer) two layer compression system, regular 1 x Per Week/30  Days Discharge Instructions: Apply Urgo K2 Lite as directed (alternative to 3 layer compression). WOUND #9: - T Great Wound Laterality: Left, Lateral oe Cleanser: Soap and Water 1 x Per Week/30 Days Discharge Instructions: May shower and wash wound with dial antibacterial soap and water prior to dressing change. Cleanser: Wound Cleanser 1 x Per Week/30 Days Discharge Instructions: Cleanse the wound with wound cleanser prior to applying a clean dressing using gauze sponges, not tissue or cotton balls. Peri-Wound Care: Sween Lotion (Moisturizing lotion) 1 x Per Week/30 Days Discharge Instructions: Apply moisturizing lotion as directed Topical: Gentamicin 1 x Per Week/30 Days Discharge Instructions: As directed by physician Topical: Mupirocin Ointment 1 x Per Week/30 Days Discharge Instructions: Apply Mupirocin (Bactroban) as instructed Topical: Ketoconazole Cream 2% 1 x Per Week/30 Days Discharge Instructions: Apply Ketoconazole as directed Topical: zinc 1 x Per Week/30 Days Prim Dressing: Endoform 2x2 in 1 x Per Week/30 Days ary Discharge Instructions: Moisten with saline Secondary Dressing: Drawtex 4x4 in 1 x Per Week/30 Days Discharge Instructions: Apply over primary dressing as directed. Secondary Dressing: Woven Gauze Sponge, Non-Sterile 4x4 in 1 x Per Week/30 Days Discharge Instructions: can use gauze between toes (if no desired) Secondary Dressing: Zetuvit Plus 4x8 in 1 x Per Week/30 Days Discharge Instructions: Apply over primary dressing as directed. Com pression Wrap: Urgo K2 Lite, (equivalent to a 3 layer) two layer compression system, regular 1 x Per Week/30 Days Discharge Instructions: Apply Urgo K2 Lite as directed (alternative to 3 layer compression). 01/22/2023: The wounds are basically unchanged, but they were not as soupy as they were last week when her dressings were removed. There is slough accumulation at all of the surfaces. Bone remains exposed on the toe ulcers.  There is no real necrosis visible at the lateral foot ulcer. The patient and her aide report that after discussion with the patient's son, the patient feels like she is interested in pursuing a surgical option with Dr. Lenell Antu. I used a curette to debride slough and eschar off of all of the open wounds. We will continue topical gentamicin, mupirocin, and ketoconazole with endoform to the wounds with exposed bone and silver alginate to the others. Continue Urgo light compression wrap. I did spend some time discussing with her the findings from her angiogram and that if she went ahead with surgery, it would mean at least a below-knee amputation and potentially above-knee. She simply does not have adequate blood flow to his heal anything less aggressive. She will discuss this further with Dr. Lenell Antu. While she awaits an appointment with him, we will continue to see her  on a weekly basis. Electronic Signature(s) Signed: 01/22/2023 3:12:39 PM By: [name] MD FACS Entered By: [name] on 01/22/2023 15:12:38 -------------------------------------------------------------------------------- HxROS Details Patient Name: Date of Service: [name], [facility] RO N 01/22/2023 2:15 PM Medical Record Number: [id number] Patient Account Number: [id number] Date of Birth/Sex: Treating RN: 08-Sep-1940 (82 y.o. 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 Denherder, Danamarie ([id number]) [phone].pdf Page 16 of 17 Endocrine Medical History: Negative for: Type I Diabetes; Type II Diabetes Past Medical History  Notes: hypothyroidism Musculoskeletal Medical History: Positive for: 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 Neurologic Medical History: Past Medical History Notes: stroke Immunizations Pneumococcal Vaccine: Received Pneumococcal Vaccination: Yes Received Pneumococcal Vaccination On or After 60th Birthday: Yes Implantable Devices None Hospitalization / Surgery History Type of Hospitalization/Surgery cholecystectomy leg surgery tonsillectomy Family and Social History Unknown History: Yes; Former smoker - smoked when she was young; Marital Status - Widowed; Alcohol Use: Never; Drug Use: No History; Caffeine Use: Daily; Financial Concerns: No; Food, Clothing or Shelter Needs: No; Support System Lacking: No; Transportation Concerns: No Electronic Signature(s) Signed: 01/22/2023 3:13:40 PM By: [name] MD FACS Entered By: [name] on 01/22/2023 15:10:18 -------------------------------------------------------------------------------- SuperBill Details Patient Name: Date of Service: [name] [facility] RO N 01/22/2023 Medical Record Number: [id number] Patient Account Number: [id number] Date of Birth/Sex: Treating RN: 08-24-1940 (82 y.o. 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 I10 Essential (primary) hypertension 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 atrial fibrillation Z79.01 Long term  (current) use of anticoagulants I89.0 Lymphedema, not elsewhere classified [name], [name] ([id number]) [id number] Page 17 of 17 Facility Procedures : CPT4 Code: [id number] Description: 7597 - DEBRIDE WOUND 1ST 20 SQ CM OR < ICD-10 Diagnosis Description 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  witho L97.525 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 wit Modifier: ut evidence of necr hout evidence of ne Quantity: 1 osis crosis Physician Procedures : CPT4 Code Description Modifier [id number] - WC PHYS LEVEL 4 - EST PT 25 ICD-10 Diagnosis Description L97.522 Non-pressure chronic ulcer of other part of left foot with fat layer exposed L97.526 Non-pressure chronic ulcer of other part of left foot  with bone involvement without evidence of necr L97.525 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 without evidence of ne 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

## 2023-01-22 NOTE — Progress Notes (Signed)
LADEN, Caeley ([id number]) [id number] Page 1 of 14 Visit Report for 01/22/2023 Arrival Information Details Patient Name: Date of Service: [name] 01/22/2023 2:15 PM Medical Record Number: [id number] Patient Account Number: [id number] Date of Birth/Sex: Treating RN: November 01, 1940 (82 y.o. [name] Primary Care Ariele Vidrio: [name], [facility] Other Clinician: Referring Laurice Iglesia: Treating Semaya Vida/Extender: [name], [facility] Weeks in Treatment: 75 Visit Information History Since Last Visit Added or deleted any medications: No Patient Arrived: Wheel Chair Any new allergies or adverse reactions: No Arrival Time: 14:11 Had a fall or experienced change in No Accompanied By: caregiver activities of daily living that may affect Transfer Assistance: Manual risk of falls: Patient Identification Verified: Yes Signs or symptoms of abuse/neglect since last visito No Secondary Verification Process Completed: Yes Hospitalized since last visit: No Patient Requires Transmission-Based Precautions: No Implantable device outside of the clinic excluding No Patient Has Alerts: Yes cellular tissue based products placed in the center Patient Alerts: ABIs: R:0.82 L:0.84 7/24 since last visit: Has Dressing in Place as Prescribed: Yes Has Compression in Place as Prescribed: Yes Pain Present Now: No Electronic Signature(s) Signed: 01/22/2023 3:42:47 PM By: [name] Entered By: [name] on 01/22/2023 14:12:45 -------------------------------------------------------------------------------- Compression Therapy Details Patient Name: Date of Service: [name] [facility] RO N 01/22/2023 2:15 PM Medical Record Number: [id number] Patient Account Number: [id number] Date of Birth/Sex: Treating RN: Sep 15, 1940 (82 y.o. [name] Primary Care Danashia [name]: [name], [facility] Other Clinician: Referring Makana Rostad: Treating Adreonna Yontz/Extender:  [name] SHO [name], [facility] Weeks in Treatment: 32 Compression Therapy Performed for Wound Assessment: Wound #6 Left,Dorsal Foot Performed By: Clinician [name], RN Compression Type: Double Layer Post Procedure Diagnosis Same as Pre-procedure Electronic Signature(s) Signed: 01/22/2023 3:42:47 PM By: [name] Entered By: [name] on 01/22/2023 14:38:40 Julio, Alayzia ([id number]) [id number] Page 2 of 14 -------------------------------------------------------------------------------- Encounter Discharge Information Details Patient Name: Date of Service: [name] 01/22/2023 2:15 PM Medical Record Number: [id number] Patient Account Number: [id number] Date of Birth/Sex: Treating RN: [birth year] (82 y.o. [name] Primary Care Madasyn Heath: [name], [facility] Other Clinician: Referring Ardyn Forge: Treating Terre Hanneman/Extender: [name] SHO [name], [facility] Weeks in Treatment: 59 Encounter Discharge Information Items Post Procedure Vitals Discharge Condition: Stable Temperature (F): 97.7 Ambulatory Status: Wheelchair Pulse (bpm): 90 Discharge Destination: Skilled Nursing Facility Respiratory Rate (breaths/min): 18 Telephoned: No Blood Pressure (mmHg): 125/67 Orders Sent: Yes Transportation: Private Auto Accompanied By: caregiver Schedule Follow-up Appointment: Yes Clinical Summary of Care: Patient Declined Electronic Signature(s) Signed: 01/22/2023 3:42:47 PM By: [name] Entered By: [name] on 01/22/2023 15:36:02 -------------------------------------------------------------------------------- Lower Extremity Assessment Details Patient Name: Date of Service: [name] RO N 01/22/2023 2:15 PM Medical Record Number: [id number] Patient Account Number: [id number] Date of Birth/Sex: Treating RN: 25-Sep-1940 (82 y.o. [name] Primary Care Kaitlynn Tramontana: [name], [facility] Other  Clinician: Referring Dajanay Northrup: Treating Derwin Reddy/Extender: [name] SHO [name], [facility] Weeks in Treatment: 32 Edema Assessment Assessed: [Left: No] [Right: No] Edema: [Left: Ye] [Right: s] Calf Left: Right: Point of Measurement: From Medial Instep 28 cm 27.5 cm Ankle Left: Right: Point of Measurement: From Medial Instep 23 cm 19 cm Vascular Assessment Pulses: Dorsalis Pedis Palpable: [Left:Yes] Extremity colors, hair growth, and conditions: Extremity Color: [Left:Hyperpigmented] Hair Growth on Extremity: [Left:No] [Right:No] Temperature of Extremity: [Left:Cool] [Right:Cool] Capillary Refill: [Left:> 3 seconds] Dependent Rubor: [Left:Yes No] [Right:Yes No] Electronic Signature(s) Signed: 01/22/2023 3:42:47 PM By: [name] Entered By: [name] on 01/22/2023 14:21:35 Joseph, Kathryn ([id number]) [id number] Page  3 of 14 -------------------------------------------------------------------------------- Multi Wound Chart Details Patient Name: Date of Service: [name] 01/22/2023 2:15 PM Medical Record Number: [id number] Patient Account Number: [id number] Date of Birth/Sex: Treating RN: March 08, 1941 (82 y.o. F) Primary Care Marquel Pottenger: [name], [facility] Other Clinician: Referring Faris Coolman: Treating Neysha Criado/Extender: [name], [facility] Weeks in Treatment: 32 Vital Signs Height(in): 67 Pulse(bpm): 90 Weight(lbs): 153 Blood Pressure(mmHg): 125/67 Body Mass Index(BMI): 24 Temperature(F): 97.7 Respiratory Rate(breaths/min): 18 [10:Photos:] Left T Second oe Left, Dorsal Foot Left, Lateral Foot Wound Location: Gradually Appeared Gradually Appeared Pressure Injury Wounding Event: Lymphedema Lymphedema Pressure Ulcer Primary Etiology: Anemia, Angina, Hypertension, Anemia, Angina, Hypertension, Anemia, Angina, Hypertension, Comorbid History: Vasculitis, Osteoarthritis Vasculitis, Osteoarthritis Vasculitis,  Osteoarthritis 09/05/2022 09/05/2022 10/24/2022 Date Acquired: 6 19 12  Weeks of Treatment: Open Open Open Wound Status: No No No Wound Recurrence: 0.6x0.7x0.1 1.5x2x0.1 0.9x0.8x0.2 Measurements L x W x D (cm) 0.33 2.356 0.565 A (cm) : rea 0.033 0.236 0.113 Volume (cm) : -68.40% 93.90% -188.30% % Reduction in A rea: -65.00% 93.90% -189.70% % Reduction in Volume: Full Thickness With Exposed Support 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 Category/Stage IV Classification: Structures Support Structures Medium Medium Medium Exudate A mount: Serosanguineous Serosanguineous Serosanguineous Exudate Type: red, brown red, brown red, brown Exudate Color: Distinct, outline attached Indistinct, nonvisible Distinct, outline attached Wound Margin: Small (1-33%) Small (1-33%) Small (1-33%) Granulation A mount: Pink Pink Pink Granulation Quality: Large (67-100%) Large (67-100%) Large (67-100%) Necrotic A mount: Fat Layer (Subcutaneous Tissue): Yes Fat Layer (Subcutaneous Tissue): Yes Fat Layer (Subcutaneous Tissue): Yes Exposed Structures: Fascia: No Fascia: No Fascia: No Tendon: No Tendon: No Tendon: No Muscle: No Muscle: No Muscle: No Joint: No Joint: No Joint: No Bone: No Bone: No Bone: No Small (1-33%) Medium (34-66%) Small (1-33%) Epithelialization: Debridement - Selective/Open Wound Debridement - Selective/Open Wound Debridement - Selective/Open Wound Debridement: Pre-procedure Verification/Time Out 14:31 14:31 14:31 Taken: Lidocaine 4% Topical Solution Lidocaine 4% Topical Solution Lidocaine 4% Topical Solution Pain Control: Necrotic/Eschar, Ambulance person, Ambulance person, [employer] Tissue Debrided: Non-Viable Tissue Non-Viable Tissue Non-Viable Tissue Level: 0.33 2.36 0.57 Debridement A (sq cm): rea Curette Curette Curette Instrument: Minimum Minimum Minimum Bleeding: Pressure Pressure Pressure Hemostasis A chieved: Procedure was tolerated well  Procedure was tolerated well Procedure was tolerated well Debridement Treatment Response: 0.6x0.7x0.1 1.5x2x0.1 0.9x0.8x0.2 Post Debridement Measurements L x W x D (cm) 0.033 0.236 0.113 Post Debridement Volume: (cm) N/A N/A Category/Stage IV Post Debridement Stage: No Abnormalities Noted Excoriation: Yes No Abnormalities Noted Periwound Skin TextureRoselani Grajeda, Zarria ([id number]) [id number] Page 4 of 14 Maceration: Yes Maceration: Yes Dry/Scaly: Yes Periwound Skin Moisture: No Abnormalities Noted Rubor: Yes No Abnormalities Noted Periwound Skin Color: No Abnormality No Abnormality No Abnormality Temperature: Yes Yes Yes Tenderness on Palpation: Debridement Compression Therapy Debridement Procedures Performed: Debridement Wound Number: 9 N/A N/A Photos: N/A N/A Left, Lateral T Great oe N/A N/A Wound Location: Gradually Appeared N/A N/A Wounding Event: Auto-immune N/A N/A Primary Etiology: Anemia, Angina, Hypertension, N/A N/A Comorbid History: Vasculitis, Osteoarthritis 11/01/2022 N/A N/A Date Acquired: 11 N/A N/A Weeks of Treatment: Open N/A N/A Wound Status: No N/A N/A Wound Recurrence: 0.4x0.2x0.1 N/A N/A Measurements L x W x D (cm) 0.063 N/A N/A A (cm) : rea 0.006 N/A N/A Volume (cm) : 90.90% N/A N/A % Reduction in A rea: 91.30% N/A N/A % Reduction in Volume: Full Thickness With Exposed Support N/A N/A Classification: Structures Small N/A N/A Exudate A mount: Purulent N/A N/A Exudate Type: yellow, brown, green N/A N/A Exudate Color: Flat  and Intact N/A N/A Wound Margin: Small (1-33%) N/A N/A Granulation A mount: Pink N/A N/A Granulation Quality: Large (67-100%) N/A N/A Necrotic A mount: Fat Layer (Subcutaneous Tissue): Yes N/A N/A Exposed Structures: Bone: Yes Fascia: No Tendon: No Muscle: No Joint: No Large (67-100%) N/A N/A Epithelialization: Debridement - Selective/Open Wound N/A  N/A Debridement: Pre-procedure Verification/Time Out 14:31 N/A N/A Taken: Lidocaine 4% Topical Solution N/A N/A Pain Control: Necrotic/Eschar, Slough N/A N/A Tissue Debrided: Non-Viable Tissue N/A N/A Level: 0.06 N/A N/A Debridement A (sq cm): rea Curette N/A N/A Instrument: Minimum N/A N/A Bleeding: Pressure N/A N/A Hemostasis A chieved: Procedure was tolerated well N/A N/A Debridement Treatment Response: 0.4x0.2x0.1 N/A N/A Post Debridement Measurements L x W x D (cm) 0.006 N/A N/A Post Debridement Volume: (cm) N/A N/A N/A Post Debridement Stage: No Abnormalities Noted N/A N/A Periwound Skin Texture: No Abnormalities Noted N/A N/A Periwound Skin Moisture: Rubor: Yes N/A N/A Periwound Skin Color: No Abnormality N/A N/A Temperature: Yes N/A N/A Tenderness on Palpation: Debridement N/A N/A Procedures Performed: Treatment Notes Electronic Signature(s) Signed: 01/22/2023 3:08:22 PM By: [name] MD FACS Entered By: [name] on 01/22/2023 15:08:22 Herrero, [name] ([id number]) [id number] Page 5 of 14 -------------------------------------------------------------------------------- Multi-Disciplinary Care Plan Details Patient Name: Date of Service: [name] 01/22/2023 2:15 PM Medical Record Number: [id number] Patient Account Number: [id number] Date of Birth/Sex: Treating RN: 21-Sep-1940 (82 y.o. [name] Primary Care Mayme Profeta: [name], [facility] Other Clinician: Referring Fynn Adel: Treating Leeya Rusconi/Extender: [name], [facility] Weeks in Treatment: 73 Multidisciplinary Care Plan reviewed with physician Active Inactive Necrotic Tissue Nursing Diagnoses: Impaired tissue integrity related to necrotic/devitalized tissue Knowledge deficit related to management of necrotic/devitalized tissue Goals: Necrotic/devitalized tissue will be minimized in the wound bed Date Initiated: 06/12/2022 Target  Resolution Date: 02/22/2023 Goal Status: Active Patient/caregiver will verbalize understanding of reason and process for debridement of necrotic tissue Date Initiated: 06/12/2022 Target Resolution Date: 02/22/2023 Goal Status: Active Interventions: Assess patient pain level pre-, during and post procedure and prior to discharge Provide education on necrotic tissue and debridement process Treatment Activities: Apply topical anesthetic as ordered : 06/12/2022 Notes: Wound/Skin Impairment Nursing Diagnoses: Impaired tissue integrity Knowledge deficit related to ulceration/compromised skin integrity Goals: Patient/caregiver will verbalize understanding of skin care regimen Date Initiated: 06/12/2022 Target Resolution Date: 02/22/2023 Goal Status: Active Interventions: Assess ulceration(s) every visit Treatment Activities: Skin care regimen initiated : 06/12/2022 Topical wound management initiated : 06/12/2022 Notes: Electronic Signature(s) Signed: 01/22/2023 3:42:47 PM By: [name] Entered By: [name] on 01/22/2023 14:23:32 Geffrard, Katessa ([id number]) [id number] Page 6 of 14 -------------------------------------------------------------------------------- Pain Assessment Details Patient Name: Date of Service: [name] 01/22/2023 2:15 PM Medical Record Number: [id number] Patient Account Number: [id number] Date of Birth/Sex: Treating RN: Apr 12, 1941 (82 y.o. [name] Primary Care Camauri Craton: [name], [facility] Other Clinician: Referring Griffyn Kucinski: Treating Trinita Devlin/Extender: [name] SHO [name], [facility] Weeks in Treatment: 71 Active Problems Location of Pain Severity and Description of Pain Patient Has Paino No Site Locations Rate the pain. Worst Pain Level: 0 Pain Management and Medication Current Pain Management: Electronic Signature(s) Signed: 01/22/2023 3:42:47 PM By: [name] Entered By: [name] on 01/22/2023 14:18:33 -------------------------------------------------------------------------------- Patient/Caregiver Education Details Patient Name: Date of Service: [name] RO N 10/1/2024andnbsp2:15 PM Medical Record Number: [id number] Patient Account Number: [id number] Date of Birth/Gender: Treating RN: 1941/03/08 (82 y.o. [name] Primary Care Physician: [name], [facility] Other Clinician: Referring Physician: Treating Physician/Extender: [name], [facility]  Weeks in Treatment: 32 Education Assessment Education Provided To: Patient Education Topics Provided Wound/Skin Impairment: Methods: Explain/Verbal Responses: Reinforcements needed, State content correctly Electronic Signature(s) Signed: 01/22/2023 3:42:47 PM By: [name] Entered By: [name] on 01/22/2023 14:23:43 Wenzlick, Daneya ([id number]) [id number] Page 7 of 14 -------------------------------------------------------------------------------- Wound Assessment Details Patient Name: Date of Service: [name] 01/22/2023 2:15 PM Medical Record Number: [id number] Patient Account Number: [id number] Date of Birth/Sex: Treating RN: 11-Jan-1941 (82 y.o. [name] Primary Care Oakes Mccready: [name], [facility] Other Clinician: Referring Kirston Luty: Treating Psalm Schappell/Extender: [name] SHO [name], [facility] Weeks in Treatment: 32 Wound Status Wound Number: 10 Primary Etiology: Lymphedema Wound Location: Left T Second oe Wound Status: Open Wounding Event: Gradually Appeared Comorbid History: Anemia, Angina, Hypertension, Vasculitis, Osteoarthritis Date Acquired: 09/05/2022 Weeks Of Treatment: 6 Clustered Wound: No Photos Wound Measurements Length: (cm) Width: (cm) Depth: (cm) Area: (cm) Volume: (cm) 0.6 % Reduction in Area: -68.4% 0.7 % Reduction in Volume: -65% 0.1 Epithelialization: Small (1-33%) 0.33 Tunneling:  No 0.033 Undermining: No Wound Description Classification: Full Thickness With Exposed Suppor Wound Margin: Distinct, outline attached Exudate Amount: Medium Exudate Type: Serosanguineous Exudate Color: red, brown t Structures Foul Odor After Cleansing: No Slough/Fibrino Yes Wound Bed Granulation Amount: Small (1-33%) Exposed Structure Granulation Quality: Pink Fascia Exposed: No Necrotic Amount: Large (67-100%) Fat Layer (Subcutaneous Tissue) Exposed: Yes Necrotic Quality: Adherent Slough Tendon Exposed: No Muscle Exposed: No Joint Exposed: No Bone Exposed: No Periwound Skin Texture Texture Color No Abnormalities Noted: Yes No Abnormalities Noted: Yes Moisture Temperature / Pain No Abnormalities Noted: Yes Temperature: No Abnormality Tenderness on Palpation: Yes Treatment Notes Wound #10 (Toe Second) Wound Laterality: Left Dutil, Kyra ([id number]) [id number] Page 8 of 14 Cleanser Soap and Water Discharge Instruction: May shower and wash wound with dial antibacterial soap and water prior to dressing change. Wound Cleanser Discharge Instruction: Cleanse the wound with wound cleanser prior to applying a clean dressing using gauze sponges, not tissue or cotton balls. Peri-Wound Care Sween Lotion (Moisturizing lotion) Discharge Instruction: Apply moisturizing lotion as directed Topical Gentamicin Discharge Instruction: As directed by physician Mupirocin Ointment Discharge Instruction: Apply Mupirocin (Bactroban) as instructed Ketoconazole Cream 2% Discharge Instruction: Apply Ketoconazole as directed zinc Primary Dressing Endoform 2x2 in Discharge Instruction: Moisten with saline Secondary Dressing Drawtex 4x4 in Discharge Instruction: Apply over primary dressing as directed. Woven Gauze Sponge, Non-Sterile 4x4 in Discharge Instruction: can use gauze between toes (if no desired) Zetuvit Plus 4x8 in Discharge Instruction: Apply over  primary dressing as directed. Secured With Compression Wrap Urgo K2 Lite, (equivalent to a 3 layer) two layer compression system, regular Discharge Instruction: Apply Urgo K2 Lite as directed (alternative to 3 layer compression). Compression Stockings Add-Ons Electronic Signature(s) Signed: 01/22/2023 3:10:21 PM By: [name] Signed: 01/22/2023 3:42:47 PM By: Gelene Mink By: [name] on 01/22/2023 14:24:23 -------------------------------------------------------------------------------- Wound Assessment Details Patient Name: Date of Service: [name] [facility] RO N 01/22/2023 2:15 PM Medical Record Number: [id number] Patient Account Number: [id number] Date of Birth/Sex: Treating RN: 20-Sep-1940 (82 y.o. [name] Primary Care Gennaro Lizotte: [name], [facility] Other Clinician: Referring Sayeed Weatherall: Treating Eric Nees/Extender: [name] SHO [name], [facility] Weeks in Treatment: 32 Wound Status Wound Number: 6 Primary Etiology: Lymphedema Wound Location: Left, Dorsal Foot Wound Status: Open Wounding Event: Gradually Appeared Comorbid History: Anemia, Angina, Hypertension, Vasculitis, Osteoarthritis Date Acquired: 09/05/2022 Weeks Of Treatment: 19 Clustered Wound: No Photos Fiero, Mayzee ([id number]) [id number] Page 9 of 14 Wound Measurements Length: (cm) 1.5 Width: (cm) 2 Depth: (  cm) 0.1 Area: (cm) 2.356 Volume: (cm) 0.236 % Reduction in Area: 93.9% % Reduction in Volume: 93.9% Epithelialization: Medium (34-66%) Tunneling: No Undermining: No Wound Description Classification: 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 Support Structures Wound Margin: Indistinct, nonvisible Exudate Amount: Medium Exudate Type: Serosanguineous Exudate Color: red, brown Foul Odor After Cleansing: No Slough/Fibrino Yes Wound Bed Granulation Amount: Small (1-33%) Exposed Structure Granulation Quality: Pink Fascia Exposed: No Necrotic Amount: Large  (67-100%) Fat Layer (Subcutaneous Tissue) Exposed: Yes Necrotic Quality: Adherent Slough Tendon Exposed: No Muscle Exposed: No Joint Exposed: No Bone Exposed: No Periwound Skin Texture Texture Color No Abnormalities Noted: Yes No Abnormalities Noted: No Rubor: Yes Moisture No Abnormalities Noted: Yes Temperature / Pain Temperature: No Abnormality Tenderness on Palpation: Yes Treatment Notes Wound #6 (Foot) Wound Laterality: Dorsal, Left Cleanser Soap and Water Discharge Instruction: May shower and wash wound with dial antibacterial soap and water prior to dressing change. Wound Cleanser Discharge Instruction: Cleanse the wound with wound cleanser prior to applying a clean dressing using gauze sponges, not tissue or cotton balls. Peri-Wound Care Sween Lotion (Moisturizing lotion) Discharge Instruction: Apply moisturizing lotion as directed Topical Gentamicin Discharge Instruction: As directed by physician Mupirocin Ointment Discharge Instruction: Apply Mupirocin (Bactroban) as instructed Ketoconazole Cream 2% Discharge Instruction: Apply Ketoconazole as directed zinc Primary Dressing Maxorb Extra Ag+ Alginate Dressing, 4x4.75 (in/in) Discharge Instruction: Apply to wound bed as instructed Secondary Dressing YANDA, [name] ([id number]) [id number] Page 10 of 14 Drawtex 4x4 in Discharge Instruction: Apply over primary dressing as directed. Woven Gauze Sponge, Non-Sterile 4x4 in Discharge Instruction: can use gauze between toes (if no desired) Zetuvit Plus 4x8 in Discharge Instruction: Apply over primary dressing as directed. Secured With Compression Wrap Urgo K2 Lite, (equivalent to a 3 layer) two layer compression system, regular Discharge Instruction: Apply Urgo K2 Lite as directed (alternative to 3 layer compression). Compression Stockings Add-Ons Electronic Signature(s) Signed: 01/22/2023 3:10:21 PM By: [name] Signed: 01/22/2023  3:42:47 PM By: [name] Entered By: [name] on 01/22/2023 14:24:50 -------------------------------------------------------------------------------- Wound Assessment Details Patient Name: Date of Service: [name] [facility] RO N 01/22/2023 2:15 PM Medical Record Number: [id number] Patient Account Number: [id number] Date of Birth/Sex: Treating RN: 04/27/1940 (82 y.o. [name] Primary Care Brigitt Mcclish: [name], [facility] Other Clinician: Referring Alphonso Gregson: Treating Aira Sallade/Extender: [name] SHO [name], [facility] Weeks in Treatment: 32 Wound Status Wound Number: 8 Primary Etiology: Pressure Ulcer Wound Location: Left, Lateral Foot Wound Status: Open Wounding Event: Pressure Injury Comorbid History: Anemia, Angina, Hypertension, Vasculitis, Osteoarthritis Date Acquired: 10/24/2022 Weeks Of Treatment: 12 Clustered Wound: No Photos Wound Measurements Length: (cm) 0.9 Width: (cm) 0.8 Depth: (cm) 0.2 Area: (cm) 0.565 Volume: (cm) 0.113 % Reduction in Area: -188.3% % Reduction in Volume: -189.7% Epithelialization: Small (1-33%) Tunneling: No Undermining: No Wound Description Classification: Category/Stage IV Wound Margin: Distinct, outline attached Exudate Amount: Medium Exudate Type: Serosanguineous Exudate Color: red, brown Cerami, Antigone ([id number]) Foul Odor After Cleansing: No Slough/Fibrino Yes [id number] Page 11 of 14 Wound Bed Granulation Amount: Small (1-33%) Exposed Structure Granulation Quality: Pink Fascia Exposed: No Necrotic Amount: Large (67-100%) Fat Layer (Subcutaneous Tissue) Exposed: Yes Necrotic Quality: Adherent Slough Tendon Exposed: No Muscle Exposed: No Joint Exposed: No Bone Exposed: No Periwound Skin Texture Texture Color No Abnormalities Noted: Yes No Abnormalities Noted: Yes Moisture Temperature / Pain No Abnormalities Noted: Yes Temperature: No Abnormality Tenderness on Palpation:  Yes Treatment Notes Wound #8 (Foot) Wound Laterality: Left, Lateral Cleanser Soap and Water Discharge Instruction: May shower and wash wound with dial antibacterial  soap and water prior to dressing change. Wound Cleanser Discharge Instruction: Cleanse the wound with wound cleanser prior to applying a clean dressing using gauze sponges, not tissue or cotton balls. Peri-Wound Care Sween Lotion (Moisturizing lotion) Discharge Instruction: Apply moisturizing lotion as directed Topical Gentamicin Discharge Instruction: As directed by physician Mupirocin Ointment Discharge Instruction: Apply Mupirocin (Bactroban) as instructed Ketoconazole Cream 2% Discharge Instruction: Apply Ketoconazole as directed zinc Primary Dressing Endoform 2x2 in Discharge Instruction: Moisten with saline Secondary Dressing Drawtex 4x4 in Discharge Instruction: Apply over primary dressing as directed. Woven Gauze Sponge, Non-Sterile 4x4 in Discharge Instruction: can use gauze between toes (if no desired) Zetuvit Plus 4x8 in Discharge Instruction: Apply over primary dressing as directed. Secured With Compression Wrap Urgo K2 Lite, (equivalent to a 3 layer) two layer compression system, regular Discharge Instruction: Apply Urgo K2 Lite as directed (alternative to 3 layer compression). Compression Stockings Add-Ons Electronic Signature(s) Signed: 01/22/2023 3:10:21 PM By: [name] Signed: 01/22/2023 3:42:47 PM By: [name] Entered By: [name] on 01/22/2023 14:25:24 Guinta, [name] ([id number]) [id number] Page 12 of 14 -------------------------------------------------------------------------------- Wound Assessment Details Patient Name: Date of Service: [name] 01/22/2023 2:15 PM Medical Record Number: [id number] Patient Account Number: [id number] Date of Birth/Sex: Treating RN: [birth year] (82 y.o. [name] Primary Care Kery Haltiwanger: [name],  [facility] Other Clinician: Referring Welton Bord: Treating Caridad Silveira/Extender: [name] SHO [name], [facility] Weeks in Treatment: 32 Wound Status Wound Number: 9 Primary Etiology: Auto-immune Wound Location: Left, Lateral T Great oe Wound Status: Open Wounding Event: Gradually Appeared Comorbid History: Anemia, Angina, Hypertension, Vasculitis, Osteoarthritis Date Acquired: 11/01/2022 Weeks Of Treatment: 11 Clustered Wound: No Photos Wound Measurements Length: (cm) Width: (cm) Depth: (cm) Area: (cm) Volume: (cm) 0.4 % Reduction in Area: 90.9% 0.2 % Reduction in Volume: 91.3% 0.1 Epithelialization: Large (67-100%) 0.063 Tunneling: No 0.006 Undermining: No Wound Description Classification: Full Thickness With Exposed Suppor Wound Margin: Flat and Intact Exudate Amount: Small Exudate Type: Purulent Exudate Color: yellow, brown, green t Structures Foul Odor After Cleansing: No Slough/Fibrino Yes Wound Bed Granulation Amount: Small (1-33%) Exposed Structure Granulation Quality: Pink Fascia Exposed: No Necrotic Amount: Large (67-100%) Fat Layer (Subcutaneous Tissue) Exposed: Yes Necrotic Quality: Adherent Slough Tendon Exposed: No Muscle Exposed: No Joint Exposed: No Bone Exposed: Yes Periwound Skin Texture Texture Color No Abnormalities Noted: Yes No Abnormalities Noted: Yes Moisture Temperature / Pain No Abnormalities Noted: Yes Temperature: No Abnormality Tenderness on Palpation: Yes Treatment Notes Wound #9 (Toe Great) Wound Laterality: Left, Lateral Cleanser Soap and Water [place], Arianni ([id number]) [id number] Page 13 of 14 Discharge Instruction: May shower and wash wound with dial antibacterial soap and water prior to dressing change. Wound Cleanser Discharge Instruction: Cleanse the wound with wound cleanser prior to applying a clean dressing using gauze sponges, not tissue or cotton balls. Peri-Wound Care Sween Lotion (Moisturizing  lotion) Discharge Instruction: Apply moisturizing lotion as directed Topical Gentamicin Discharge Instruction: As directed by physician Mupirocin Ointment Discharge Instruction: Apply Mupirocin (Bactroban) as instructed Ketoconazole Cream 2% Discharge Instruction: Apply Ketoconazole as directed zinc Primary Dressing Endoform 2x2 in Discharge Instruction: Moisten with saline Secondary Dressing Drawtex 4x4 in Discharge Instruction: Apply over primary dressing as directed. Woven Gauze Sponge, Non-Sterile 4x4 in Discharge Instruction: can use gauze between toes (if no desired) Zetuvit Plus 4x8 in Discharge Instruction: Apply over primary dressing as directed. Secured With Compression Wrap Urgo K2 Lite, (equivalent to a 3 layer) two layer compression system, regular Discharge Instruction: Apply Urgo K2 Lite as directed (alternative  to 3 layer compression). Compression Stockings Add-Ons Electronic Signature(s) Signed: 01/22/2023 3:10:21 PM By: [name] Signed: 01/22/2023 3:42:47 PM By: [name] Entered By: [name] on 01/22/2023 14:25:46 -------------------------------------------------------------------------------- Vitals Details Patient Name: Date of Service: [place] [name], [facility] RO N 01/22/2023 2:15 PM Medical Record Number: [id number] Patient Account Number: [id number] Date of Birth/Sex: Treating RN: [birth year] (82 y.o. [name] Primary Care Yalonda Sample: [name], [facility] Other Clinician: Referring Megyn Leng: Treating Gracen Southwell/Extender: [name] SHO [name], [facility] Weeks in Treatment: 76 Vital Signs Time Taken: 14:12 Temperature (F): 97.7 Height (in): 67 Pulse (bpm): 90 Weight (lbs): 153 Respiratory Rate (breaths/min): 18 Body Mass Index (BMI): 24 Blood Pressure (mmHg): 125/67 Reference Range: 80 - 120 mg / dl Electronic Signature(s) Signed: 01/22/2023 3:42:47 PM By: [name] Entered By: [name] on 01/22/2023 14:18:07 Lheureux,  Sully ([id number]) [id number] Page 14 of 14

## 2023-01-24 ENCOUNTER — Ambulatory Visit (HOSPITAL_BASED_OUTPATIENT_CLINIC_OR_DEPARTMENT_OTHER): Payer: Medicare Other | Admitting: General Surgery

## 2023-01-29 ENCOUNTER — Telehealth: Payer: Self-pay

## 2023-01-29 NOTE — Telephone Encounter (Signed)
[name] with 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 Lenell Antu to discuss.  Reviewed pt's chart, returned call for clarification, no answer, vm full.

## 2023-01-30 ENCOUNTER — Encounter (HOSPITAL_BASED_OUTPATIENT_CLINIC_OR_DEPARTMENT_OTHER): Payer: Medicare Other | Admitting: General Surgery

## 2023-01-30 DIAGNOSIS — L97522 Non-pressure chronic ulcer of other part of left foot with fat layer exposed: Secondary | ICD-10-CM | POA: Diagnosis not present

## 2023-01-30 NOTE — Progress Notes (Signed)
KENNEDEY, DIGILIO ([id number]) [id number] Page 1 of 15 Visit Report for 01/30/2023 Arrival Information Details Patient Name: Date of Service: [name] 01/30/2023 2:15 PM Medical Record Number: [id number] Patient Account Number: [id number] Date of Birth/Sex: Treating RN: 09/18/1940 (82 y.o. [name] Primary Care Mose Colaizzi: [name], [facility] Other Clinician: Referring Hilding Quintanar: Treating Jiyaan Steinhauser/Extender: [name], [facility] Weeks in Treatment: 84 Visit Information History Since Last Visit Added or deleted any medications: No Patient Arrived: Wheel Chair Any new allergies or adverse reactions: No Arrival Time: 14:17 Had a fall or experienced change in No Accompanied By: caregiver activities of daily living that may affect Transfer Assistance: Manual risk of falls: Patient Identification Verified: Yes Signs or symptoms of abuse/neglect since last visito No Secondary Verification Process Completed: Yes Hospitalized since last visit: No Patient Requires Transmission-Based Precautions: No Implantable device outside of the clinic excluding No Patient Has Alerts: Yes cellular tissue based products placed in the center Patient Alerts: ABIs: R:0.82 L:0.84 7/24 since last visit: Has Dressing in Place as Prescribed: Yes Has Compression in Place as Prescribed: Yes Pain Present Now: No Electronic Signature(s) Signed: 01/30/2023 4:08:12 PM By: [name] Entered By: [name] on 01/30/2023 11:18:18 -------------------------------------------------------------------------------- Compression Therapy Details Patient Name: Date of Service: [name] [facility] RO N 01/30/2023 2:15 PM Medical Record Number: [id number] Patient Account Number: [id number] Date of Birth/Sex: Treating RN: [birth year] (82 y.o. [name] Primary Care Stacee Earp: [name], [facility] Other Clinician: Referring Nur Rabold: Treating Treydon Henricks/Extender:  [name] SHO [name], [facility] Weeks in Treatment: 31 Compression Therapy Performed for Wound Assessment: Wound #6 Left,Dorsal Foot Performed By: Clinician [name], RN Compression Type: Double Layer Post Procedure Diagnosis Same as Pre-procedure Electronic Signature(s) Signed: 01/30/2023 4:08:12 PM By: [name] Entered By: [name] on 01/30/2023 12:00:54 Dosh, Amberleigh ([id number]) [id number] Page 2 of 15 -------------------------------------------------------------------------------- Encounter Discharge Information Details Patient Name: Date of Service: [name] 01/30/2023 2:15 PM Medical Record Number: [id number] Patient Account Number: [id number] Date of Birth/Sex: Treating RN: [birth year] (82 y.o. [name] Primary Care Equan Cogbill: [name], [facility] Other Clinician: Referring Najah Liverman: Treating Hattie Aguinaldo/Extender: [name] SHO [name], [facility] Weeks in Treatment: 15 Encounter Discharge Information Items Post Procedure Vitals Discharge Condition: Stable Temperature (F): 97.7 Ambulatory Status: Wheelchair Pulse (bpm): 99 Discharge Destination: Skilled Nursing Facility Respiratory Rate (breaths/min): 16 Telephoned: No Blood Pressure (mmHg): 103/67 Orders Sent: Yes Transportation: Private Auto Accompanied By: caregiver Schedule Follow-up Appointment: Yes Clinical Summary of Care: Patient Declined Electronic Signature(s) Signed: 01/30/2023 4:08:12 PM By: [name] Entered By: [name] on 01/30/2023 12:02:03 -------------------------------------------------------------------------------- Lower Extremity Assessment Details Patient Name: Date of Service: [name] 01/30/2023 2:15 PM Medical Record Number: [id number] Patient Account Number: [id number] Date of Birth/Sex: Treating RN: 1940/05/02 (82 y.o. [name] Primary Care Hiroko Tregre: [name], [facility] Other  Clinician: Referring Jolayne Branson: Treating Isabellah Sobocinski/Extender: [name] SHO [name], [facility] Weeks in Treatment: 33 Edema Assessment Assessed: [Left: No] [Right: No] Edema: [Left: Ye] [Right: s] Calf Left: Right: Point of Measurement: From Medial Instep 29 cm 27.5 cm Ankle Left: Right: Point of Measurement: From Medial Instep 21 cm 19 cm Vascular Assessment Pulses: Dorsalis Pedis Palpable: [Left:Yes] Extremity colors, hair growth, and conditions: Extremity Color: [Left:Hyperpigmented] Hair Growth on Extremity: [Left:No] [Right:No] Temperature of Extremity: [Left:Cool] [Right:Cool] Capillary Refill: [Left:> 3 seconds] Dependent Rubor: [Left:Yes No] [Right:Yes No] Electronic Signature(s) Signed: 01/30/2023 4:08:12 PM By: [name] Entered By: [name] on 01/30/2023 11:22:50 Braxton, Kimaria ([id number]) [id number] Page  3 of 15 -------------------------------------------------------------------------------- Multi Wound Chart Details Patient Name: Date of Service: [name] 01/30/2023 2:15 PM Medical Record Number: [id number] Patient Account Number: [id number] Date of Birth/Sex: Treating RN: 1940-11-18 (82 y.o. F) Primary Care Hiren Peplinski: [name], [facility] Other Clinician: Referring Enrrique Mierzwa: Treating Jalyn Dutta/Extender: [name], [facility] Weeks in Treatment: 12 Vital Signs Height(in): 67 Pulse(bpm): 99 Weight(lbs): 153 Blood Pressure(mmHg): 103/67 Body Mass Index(BMI): 24 Temperature(F): 97.7 Respiratory Rate(breaths/min): 16 [10:Photos:] Left T Second oe Left, Plantar T Third oe Left, Dorsal Foot Wound Location: Gradually Appeared Gradually Appeared Gradually Appeared Wounding Event: Lymphedema Lymphedema Lymphedema Primary Etiology: Anemia, Angina, Hypertension, Anemia, Angina, Hypertension, Anemia, Angina, Hypertension, Comorbid History: Vasculitis, Osteoarthritis Vasculitis, Osteoarthritis Vasculitis,  Osteoarthritis 09/05/2022 01/30/2023 09/05/2022 Date Acquired: 7 0 21 Weeks of Treatment: Open Open Open Wound Status: No No No Wound Recurrence: 0.4x0.5x0.1 0.4x1x0.1 1.7x2.3x0.1 Measurements L x W x D (cm) 0.157 0.314 3.071 A (cm) : rea 0.016 0.031 0.307 Volume (cm) : 19.90% N/A 92.10% % Reduction in A rea: 20.00% N/A 92.10% % Reduction in Volume: Full Thickness With Exposed Support 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 Classification: Structures Support Structures Support Structures Medium Medium Medium Exudate A mount: Serosanguineous Serous Serosanguineous Exudate Type: red, brown amber red, brown Exudate Color: Distinct, outline attached Distinct, outline attached Indistinct, nonvisible Wound Margin: Small (1-33%) Small (1-33%) Small (1-33%) Granulation A mount: Pink Red Pink Granulation Quality: Large (67-100%) Large (67-100%) Large (67-100%) Necrotic A mount: Fat Layer (Subcutaneous Tissue): Yes Fat Layer (Subcutaneous Tissue): Yes Fat Layer (Subcutaneous Tissue): Yes Exposed Structures: Fascia: No Fascia: No Fascia: No Tendon: No Tendon: No Tendon: No Muscle: No Muscle: No Muscle: No Joint: No Joint: No Joint: No Bone: No Bone: No Bone: No Small (1-33%) None Medium (34-66%) Epithelialization: Debridement - Selective/Open Wound Debridement - Selective/Open Wound Debridement - Selective/Open Wound Debridement: Pre-procedure Verification/Time Out 14:40 14:40 14:40 Taken: Lidocaine 4% Topical Solution Lidocaine 4% Topical Solution Lidocaine 4% Topical Solution Pain Control: [employer] Tissue Debrided: Non-Viable Tissue Non-Viable Tissue Non-Viable Tissue Level: 0.16 0.31 3.07 Debridement A (sq cm): rea Curette Curette Curette Instrument: Minimum Minimum Minimum Bleeding: Pressure Pressure Pressure Hemostasis A chieved: Procedure was tolerated well Procedure was tolerated well Procedure was tolerated  well Debridement Treatment Response: 0.4x0.5x0.1 0.4x1x0.1 1.7x2.3x0.1 Post Debridement Measurements L x W x D (cm) 0.016 0.031 0.307 Post Debridement Volume: (cm) N/A N/A N/A Post Debridement Stage: No Abnormalities Noted Excoriation: Yes Excoriation: Yes Periwound Skin TextureLynanne Delgreco, Lotus ([id number]) [id number] Page 4 of 15 Maceration: Yes Maceration: Yes Maceration: Yes Periwound Skin Moisture: No Abnormalities Noted No Abnormalities Noted Rubor: Yes Periwound Skin Color: No Abnormality No Abnormality No Abnormality Temperature: Yes Yes Yes Tenderness on Palpation: Debridement Debridement Debridement Procedures Performed: Wound Number: 8 9 N/A Photos: N/A Left, Lateral Foot Left, Lateral T Great oe N/A Wound Location: Pressure Injury Gradually Appeared N/A Wounding Event: Pressure Ulcer Auto-immune N/A Primary Etiology: Anemia, Angina, Hypertension, Anemia, Angina, Hypertension, N/A Comorbid History: Vasculitis, Osteoarthritis Vasculitis, Osteoarthritis 10/24/2022 11/01/2022 N/A Date Acquired: 14 12 N/A Weeks of Treatment: Open Open N/A Wound Status: No No N/A Wound Recurrence: 0.7x0.7x0.2 0.4x0.5x0.1 N/A Measurements L x W x D (cm) 0.385 0.157 N/A A (cm) : rea 0.077 0.016 N/A Volume (cm) : -96.40% 77.30% N/A % Reduction in A rea: -97.40% 76.80% N/A % Reduction in Volume: Category/Stage IV Full Thickness With Exposed Support N/A Classification: Structures Medium Small N/A Exudate A mount: Serosanguineous Serous N/A Exudate Type: red, brown amber N/A  Exudate Color: Distinct, outline attached Flat and Intact N/A Wound Margin: None Present (0%) None Present (0%) N/A Granulation A mount: N/A N/A N/A Granulation Quality: Large (67-100%) Large (67-100%) N/A Necrotic A mount: Fat Layer (Subcutaneous Tissue): Yes Fat Layer (Subcutaneous Tissue): Yes N/A Exposed Structures: Fascia: No Bone: Yes Tendon: No Fascia:  No Muscle: No Tendon: No Joint: No Muscle: No Bone: No Joint: No Small (1-33%) Large (67-100%) N/A Epithelialization: Debridement - Selective/Open Wound Debridement - Selective/Open Wound N/A Debridement: Pre-procedure Verification/Time Out 14:40 14:40 N/A Taken: Lidocaine 4% Topical Solution Lidocaine 4% Topical Solution N/A Pain Control: [facility] N/A Tissue Debrided: Non-Viable Tissue Non-Viable Tissue N/A Level: 0.38 0.16 N/A Debridement A (sq cm): rea Curette Curette N/A Instrument: Minimum Minimum N/A Bleeding: Pressure Pressure N/A Hemostasis A chieved: Procedure was tolerated well Procedure was tolerated well N/A Debridement Treatment Response: 0.7x0.7x0.2 0.4x0.5x0.1 N/A Post Debridement Measurements L x W x D (cm) 0.077 0.016 N/A Post Debridement Volume: (cm) Category/Stage IV N/A N/A Post Debridement Stage: No Abnormalities Noted No Abnormalities Noted N/A Periwound Skin Texture: Dry/Scaly: Yes No Abnormalities Noted N/A Periwound Skin Moisture: No Abnormalities Noted Rubor: Yes N/A Periwound Skin Color: No Abnormality No Abnormality N/A Temperature: Yes Yes N/A Tenderness on Palpation: Debridement Debridement N/A Procedures Performed: Treatment Notes Electronic Signature(s) Signed: 01/30/2023 2:59:56 PM By: [name] MD FACS Entered By: [name] on 01/30/2023 11:59:56 Harter, Taeja ([id number]) [id number] Page 5 of 15 -------------------------------------------------------------------------------- Multi-Disciplinary Care Plan Details Patient Name: Date of Service: [name] 01/30/2023 2:15 PM Medical Record Number: [id number] Patient Account Number: [id number] Date of Birth/Sex: Treating RN: 11-30-1940 (82 y.o. [name] Primary Care Catina Nuss: [name], [facility] Other Clinician: Referring Jawara Latorre: Treating Travon Crochet/Extender: [name], [facility] Weeks in Treatment:  65 Multidisciplinary Care Plan reviewed with physician Active Inactive Necrotic Tissue Nursing Diagnoses: Impaired tissue integrity related to necrotic/devitalized tissue Knowledge deficit related to management of necrotic/devitalized tissue Goals: Necrotic/devitalized tissue will be minimized in the wound bed Date Initiated: 06/12/2022 Target Resolution Date: 02/22/2023 Goal Status: Active Patient/caregiver will verbalize understanding of reason and process for debridement of necrotic tissue Date Initiated: 06/12/2022 Target Resolution Date: 02/22/2023 Goal Status: Active Interventions: Assess patient pain level pre-, during and post procedure and prior to discharge Provide education on necrotic tissue and debridement process Treatment Activities: Apply topical anesthetic as ordered : 06/12/2022 Notes: Wound/Skin Impairment Nursing Diagnoses: Impaired tissue integrity Knowledge deficit related to ulceration/compromised skin integrity Goals: Patient/caregiver will verbalize understanding of skin care regimen Date Initiated: 06/12/2022 Target Resolution Date: 02/22/2023 Goal Status: Active Interventions: Assess ulceration(s) every visit Treatment Activities: Skin care regimen initiated : 06/12/2022 Topical wound management initiated : 06/12/2022 Notes: Electronic Signature(s) Signed: 01/30/2023 4:08:12 PM By: [name] Entered By: [name] on 01/30/2023 11:29:25 Cockerill, [name] ([id number]) [id number] Page 6 of 15 -------------------------------------------------------------------------------- Pain Assessment Details Patient Name: Date of Service: [name] 01/30/2023 2:15 PM Medical Record Number: [id number] Patient Account Number: [id number] Date of Birth/Sex: Treating RN: [birth year] (82 y.o. [name] Primary Care Krislyn Donnan: [name], [facility] Other Clinician: Referring Bronson Bressman: Treating Marny Smethers/Extender: [name] SHO [name], [facility] Weeks in Treatment: 104 Active Problems Location of Pain Severity and Description of Pain Patient Has Paino No Site Locations Rate the pain. Current Pain Level: 0 Pain Management and Medication Current Pain Management: Electronic Signature(s) Signed: 01/30/2023 4:08:12 PM By: [name] Entered By: [name] on 01/30/2023 11:18:38 -------------------------------------------------------------------------------- Patient/Caregiver Education Details Patient Name: Date of Service: [name], SHA  RO N 10/9/2024andnbsp2:15 PM Medical Record Number: [id number] Patient Account Number: [id number] Date of Birth/Gender: Treating RN: 09/08/1940 (82 y.o. [name] Primary Care Physician: [name], [facility] Other Clinician: Referring Physician: Treating Physician/Extender: [name], [facility] Weeks in Treatment: 75 Education Assessment Education Provided To: Patient Education Topics Provided Wound/Skin Impairment: Methods: Explain/Verbal Responses: Reinforcements needed, State content correctly Electronic Signature(s) Signed: 01/30/2023 4:08:12 PM By: [name] Entered By: [name] on 01/30/2023 11:29:41 Lugar, Sayward ([id number]) [id number] Page 7 of 15 -------------------------------------------------------------------------------- Wound Assessment Details Patient Name: Date of Service: [name] 01/30/2023 2:15 PM Medical Record Number: [id number] Patient Account Number: [id number] Date of Birth/Sex: Treating RN: 1940-04-27 (82 y.o. [name] Primary Care Desteny Freeman: [name], [facility] Other Clinician: Referring Anushree Dorsi: Treating Joleigh Mineau/Extender: [name] SHO [name], [facility] Weeks in Treatment: 49 Wound Status Wound Number: 10 Primary Etiology: Lymphedema Wound Location: Left T Second oe Wound Status: Open Wounding Event: Gradually Appeared Comorbid  History: Anemia, Angina, Hypertension, Vasculitis, Osteoarthritis Date Acquired: 09/05/2022 Weeks Of Treatment: 7 Clustered Wound: No Photos Wound Measurements Length: (cm) Width: (cm) Depth: (cm) Area: (cm) Volume: (cm) 0.4 % Reduction in Area: 19.9% 0.5 % Reduction in Volume: 20% 0.1 Epithelialization: Small (1-33%) 0.157 Tunneling: No 0.016 Undermining: No Wound Description Classification: Full Thickness With Exposed Suppor Wound Margin: Distinct, outline attached Exudate Amount: Medium Exudate Type: Serosanguineous Exudate Color: red, brown t Structures Foul Odor After Cleansing: No Slough/Fibrino Yes Wound Bed Granulation Amount: Small (1-33%) Exposed Structure Granulation Quality: Pink Fascia Exposed: No Necrotic Amount: Large (67-100%) Fat Layer (Subcutaneous Tissue) Exposed: Yes Necrotic Quality: Adherent Slough Tendon Exposed: No Muscle Exposed: No Joint Exposed: No Bone Exposed: No Periwound Skin Texture Texture Color No Abnormalities Noted: Yes No Abnormalities Noted: Yes Moisture Temperature / Pain No Abnormalities Noted: Yes Temperature: No Abnormality Tenderness on Palpation: Yes Treatment Notes Wound #10 (Toe Second) Wound Laterality: Left Padmore, Searra ([id number]) [id number] Page 8 of 15 Cleanser Soap and Water Discharge Instruction: May shower and wash wound with dial antibacterial soap and water prior to dressing change. Wound Cleanser Discharge Instruction: Cleanse the wound with wound cleanser prior to applying a clean dressing using gauze sponges, not tissue or cotton balls. Peri-Wound Care Sween Lotion (Moisturizing lotion) Discharge Instruction: Apply moisturizing lotion as directed Topical Ketoconazole Cream 2% Discharge Instruction: Apply Ketoconazole as directed zinc Primary Dressing Maxorb Extra Ag+ Alginate Dressing, 2x2 (in/in) Discharge Instruction: Apply to wound bed as instructed Secondary  Dressing Drawtex 4x4 in Discharge Instruction: Apply over primary dressing as directed. Woven Gauze Sponge, Non-Sterile 4x4 in Discharge Instruction: can use gauze between toes (if no desired) Zetuvit Plus 4x8 in Discharge Instruction: Apply over primary dressing as directed. Secured With Compression Wrap Urgo K2 Lite, (equivalent to a 3 layer) two layer compression system, regular Discharge Instruction: Apply Urgo K2 Lite as directed (alternative to 3 layer compression). Compression Stockings Add-Ons Electronic Signature(s) Signed: 01/30/2023 3:05:26 PM By: [name] Signed: 01/30/2023 4:08:12 PM By: [name] Entered By: [name] on 01/30/2023 11:29:33 -------------------------------------------------------------------------------- Wound Assessment Details Patient Name: Date of Service: [name] [facility] RO N 01/30/2023 2:15 PM Medical Record Number: [id number] Patient Account Number: [id number] Date of Birth/Sex: Treating RN: 11/06/1940 (82 y.o. [name] Primary Care Yareth Macdonnell: [name], [facility] Other Clinician: Referring Conley Delisle: Treating Milessa Hogan/Extender: [name] SHO KES, [facility] Weeks in Treatment: 60 Wound Status Wound Number: 12 Primary Etiology: Lymphedema Wound Location: Left, Plantar T Third oe Wound Status: Open Wounding Event: Gradually Appeared Comorbid History:  Anemia, Angina, Hypertension, Vasculitis, Osteoarthritis Date Acquired: 01/30/2023 Weeks Of Treatment: 0 Clustered Wound: No Photos Monon, Jezelle ([id number]) [id number] Page 9 of 15 Wound Measurements Length: (cm) 0.4 Width: (cm) 1 Depth: (cm) 0.1 Area: (cm) 0.314 Volume: (cm) 0.031 % Reduction in Area: % Reduction in Volume: Epithelialization: None Tunneling: No Undermining: No Wound Description Classification: 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 Support Structures Wound Margin: Distinct, outline attached Exudate Amount: Medium Exudate Type:  Serous Exudate Color: amber Foul Odor After Cleansing: No Slough/Fibrino Yes Wound Bed Granulation Amount: Small (1-33%) Exposed Structure Granulation Quality: Red Fascia Exposed: No Necrotic Amount: Large (67-100%) Fat Layer (Subcutaneous Tissue) Exposed: Yes Necrotic Quality: Adherent Slough Tendon Exposed: No Muscle Exposed: No Joint Exposed: No Bone Exposed: No Periwound Skin Texture Texture Color No Abnormalities Noted: No No Abnormalities Noted: Yes Excoriation: Yes Temperature / Pain Temperature: No Abnormality Moisture No Abnormalities Noted: No Tenderness on Palpation: Yes Maceration: Yes Treatment Notes Wound #12 (Toe Third) Wound Laterality: Plantar, Left Cleanser Soap and Water Discharge Instruction: May shower and wash wound with dial antibacterial soap and water prior to dressing change. Wound Cleanser Discharge Instruction: Cleanse the wound with wound cleanser prior to applying a clean dressing using gauze sponges, not tissue or cotton balls. Peri-Wound Care Sween Lotion (Moisturizing lotion) Discharge Instruction: Apply moisturizing lotion as directed Topical Ketoconazole Cream 2% Discharge Instruction: Apply Ketoconazole as directed zinc Primary Dressing Maxorb Extra Ag+ Alginate Dressing, 2x2 (in/in) Discharge Instruction: Apply to wound bed as instructed Secondary Dressing Drawtex 4x4 in Discharge Instruction: Apply over primary dressing as directed. Woven Gauze Sponge, Non-Sterile 4x4 in Discharge Instruction: can use gauze between toes (if no desired) Patricelli, Nariya ([id number]) [id number] Page 10 of 15 Zetuvit Plus 4x8 in Discharge Instruction: Apply over primary dressing as directed. Secured With Compression Wrap Urgo K2 Lite, (equivalent to a 3 layer) two layer compression system, regular Discharge Instruction: Apply Urgo K2 Lite as directed (alternative to 3 layer compression). Compression  Stockings Add-Ons Electronic Signature(s) Signed: 01/30/2023 3:05:26 PM By: [name] Signed: 01/30/2023 4:08:12 PM By: [name] Entered By: [name] on 01/30/2023 11:30:43 -------------------------------------------------------------------------------- Wound Assessment Details Patient Name: Date of Service: [name] [facility] RO N 01/30/2023 2:15 PM Medical Record Number: [id number] Patient Account Number: [id number] Date of Birth/Sex: Treating RN: 1940-08-13 (82 y.o. [name] Primary Care Menucha Dicesare: [name], [facility] Other Clinician: Referring Radie Berges: Treating Raveen Wieseler/Extender: [name] SHO [name], [facility] Weeks in Treatment: 15 Wound Status Wound Number: 6 Primary Etiology: Lymphedema Wound Location: Left, Dorsal Foot Wound Status: Open Wounding Event: Gradually Appeared Comorbid History: Anemia, Angina, Hypertension, Vasculitis, Osteoarthritis Date Acquired: 09/05/2022 Weeks Of Treatment: 21 Clustered Wound: No Photos Wound Measurements Length: (cm) 1.7 Width: (cm) 2.3 Depth: (cm) 0.1 Area: (cm) 3.071 Volume: (cm) 0.307 % Reduction in Area: 92.1% % Reduction in Volume: 92.1% Epithelialization: Medium (34-66%) Wound Description Classification: 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 Support Structures Wound Margin: Indistinct, nonvisible Exudate Amount: Medium Exudate Type: Serosanguineous Exudate Color: red, brown Foul Odor After Cleansing: No Slough/Fibrino Yes Wound Bed Granulation Amount: Small (1-33%) Exposed Structure Granulation Quality: Pink Fascia Exposed: No Necrotic Amount: Large (67-100%) Fat Layer (Subcutaneous Tissue) Exposed: Yes Riede, Allianna ([id number]) [id number] Page 11 of 15 Necrotic Quality: Adherent Slough Tendon Exposed: No Muscle Exposed: No Joint Exposed: No Bone Exposed: No Periwound Skin Texture Texture Color No Abnormalities Noted: Yes No Abnormalities Noted: No Rubor:  Yes Moisture No Abnormalities Noted: Yes Temperature / Pain Temperature: No Abnormality Tenderness on Palpation: Yes Treatment Notes Wound #6 (Foot) Wound  Laterality: Dorsal, Left Cleanser Soap and Water Discharge Instruction: May shower and wash wound with dial antibacterial soap and water prior to dressing change. Wound Cleanser Discharge Instruction: Cleanse the wound with wound cleanser prior to applying a clean dressing using gauze sponges, not tissue or cotton balls. Peri-Wound Care Sween Lotion (Moisturizing lotion) Discharge Instruction: Apply moisturizing lotion as directed Topical Ketoconazole Cream 2% Discharge Instruction: Apply Ketoconazole as directed zinc Primary Dressing Maxorb Extra Ag+ Alginate Dressing, 2x2 (in/in) Discharge Instruction: Apply to wound bed as instructed Secondary Dressing Drawtex 4x4 in Discharge Instruction: Apply over primary dressing as directed. Woven Gauze Sponge, Non-Sterile 4x4 in Discharge Instruction: can use gauze between toes (if no desired) Zetuvit Plus 4x8 in Discharge Instruction: Apply over primary dressing as directed. Secured With Compression Wrap Urgo K2 Lite, (equivalent to a 3 layer) two layer compression system, regular Discharge Instruction: Apply Urgo K2 Lite as directed (alternative to 3 layer compression). Compression Stockings Add-Ons Electronic Signature(s) Signed: 01/30/2023 3:05:26 PM By: [name] Signed: 01/30/2023 4:08:12 PM By: [name] Entered By: [name] on 01/30/2023 11:31:07 -------------------------------------------------------------------------------- Wound Assessment Details Patient Name: Date of Service: [name] [facility] RO N 01/30/2023 2:15 PM Medical Record Number: [id number] Patient Account Number: [id number] Date of Birth/Sex: Treating RN: 12/13/1940 (82 y.o. [name] Primary Care Conner Muegge: [name], [facility] Other Clinician: Referring Rayme Bui: Treating  Deontre Allsup/Extender: [name], [facility] [place], [place] ([id number]) [id number] Page 12 of 15 Weeks in Treatment: 33 Wound Status Wound Number: 8 Primary Etiology: Pressure Ulcer Wound Location: Left, Lateral Foot Wound Status: Open Wounding Event: Pressure Injury Comorbid History: Anemia, Angina, Hypertension, Vasculitis, Osteoarthritis Date Acquired: 10/24/2022 Weeks Of Treatment: 14 Clustered Wound: No Photos Wound Measurements Length: (cm) 0.7 Width: (cm) 0.7 Depth: (cm) 0.2 Area: (cm) 0.385 Volume: (cm) 0.077 % Reduction in Area: -96.4% % Reduction in Volume: -97.4% Epithelialization: Small (1-33%) Tunneling: No Undermining: No Wound Description Classification: Category/Stage IV Wound Margin: Distinct, outline attached Exudate Amount: Medium Exudate Type: Serosanguineous Exudate Color: red, brown Foul Odor After Cleansing: No Slough/Fibrino Yes Wound Bed Granulation Amount: None Present (0%) Exposed Structure Necrotic Amount: Large (67-100%) Fascia Exposed: No Necrotic Quality: Adherent Slough Fat Layer (Subcutaneous Tissue) Exposed: Yes Tendon Exposed: No Muscle Exposed: No Joint Exposed: No Bone Exposed: No Periwound Skin Texture Texture Color No Abnormalities Noted: Yes No Abnormalities Noted: Yes Moisture Temperature / Pain No Abnormalities Noted: Yes Temperature: No Abnormality Tenderness on Palpation: Yes Treatment Notes Wound #8 (Foot) Wound Laterality: Left, Lateral Cleanser Soap and Water Discharge Instruction: May shower and wash wound with dial antibacterial soap and water prior to dressing change. Wound Cleanser Discharge Instruction: Cleanse the wound with wound cleanser prior to applying a clean dressing using gauze sponges, not tissue or cotton balls. Peri-Wound Care Sween Lotion (Moisturizing lotion) Discharge Instruction: Apply moisturizing lotion as directed Topical Ketoconazole Cream  2% Discharge Instruction: Apply Ketoconazole as directed zinc Hamblin, Sharina ([id number]) [id number] Page 13 of 15 Primary Dressing Maxorb Extra Ag+ Alginate Dressing, 2x2 (in/in) Discharge Instruction: Apply to wound bed as instructed Secondary Dressing Drawtex 4x4 in Discharge Instruction: Apply over primary dressing as directed. Woven Gauze Sponge, Non-Sterile 4x4 in Discharge Instruction: can use gauze between toes (if no desired) Zetuvit Plus 4x8 in Discharge Instruction: Apply over primary dressing as directed. Secured With Compression Wrap Urgo K2 Lite, (equivalent to a 3 layer) two layer compression system, regular Discharge Instruction: Apply Urgo K2 Lite as directed (alternative to 3 layer compression). Compression Stockings Add-Ons Electronic Signature(s) Signed: 01/30/2023  3:05:26 PM By: [name] Signed: 01/30/2023 4:08:12 PM By: Gelene Mink ByDayton Scrape on 01/30/2023 11:31:34 -------------------------------------------------------------------------------- Wound Assessment Details Patient Name: Date of Service: [place] [name] [facility] RO N 01/30/2023 2:15 PM Medical Record Number: [id number] Patient Account Number: [id number] Date of Birth/Sex: Treating RN: Oct 07, 1940 (82 y.o. [name] Primary Care Pamela Intrieri: [name], [facility] Other Clinician: Referring Temitope Griffing: Treating Valorie Mcgrory/Extender: [name] SHO [name], [facility] Weeks in Treatment: 73 Wound Status Wound Number: 9 Primary Etiology: Auto-immune Wound Location: Left, Lateral T Great oe Wound Status: Open Wounding Event: Gradually Appeared Comorbid History: Anemia, Angina, Hypertension, Vasculitis, Osteoarthritis Date Acquired: 11/01/2022 Weeks Of Treatment: 12 Clustered Wound: No Photos Wound Measurements Length: (cm) 0.4 Width: (cm) 0.5 Depth: (cm) 0.1 Area: (cm) 0.157 Volume: (cm) 0.016 % Reduction in Area: 77.3% % Reduction in Volume:  76.8% Epithelialization: Large (67-100%) Tunneling: No Undermining: No Wound Description Karney, Chaquita ([id number]) Classification: Full Thickness With Exposed Support Structures Wound Margin: Flat and Intact Exudate Amount: Small Exudate Type: Serous Exudate Color: amber [id number] Page 14 of 15 Foul Odor After Cleansing: No Slough/Fibrino Yes Wound Bed Granulation Amount: None Present (0%) Exposed Structure Necrotic Amount: Large (67-100%) Fascia Exposed: No Necrotic Quality: Adherent Slough Fat Layer (Subcutaneous Tissue) Exposed: Yes Tendon Exposed: No Muscle Exposed: No Joint Exposed: No Bone Exposed: Yes Periwound Skin Texture Texture Color No Abnormalities Noted: Yes No Abnormalities Noted: Yes Moisture Temperature / Pain No Abnormalities Noted: Yes Temperature: No Abnormality Tenderness on Palpation: Yes Treatment Notes Wound #9 (Toe Great) Wound Laterality: Left, Lateral Cleanser Soap and Water Discharge Instruction: May shower and wash wound with dial antibacterial soap and water prior to dressing change. Wound Cleanser Discharge Instruction: Cleanse the wound with wound cleanser prior to applying a clean dressing using gauze sponges, not tissue or cotton balls. Peri-Wound Care Sween Lotion (Moisturizing lotion) Discharge Instruction: Apply moisturizing lotion as directed Topical Ketoconazole Cream 2% Discharge Instruction: Apply Ketoconazole as directed zinc Primary Dressing Maxorb Extra Ag+ Alginate Dressing, 2x2 (in/in) Discharge Instruction: Apply to wound bed as instructed Secondary Dressing Drawtex 4x4 in Discharge Instruction: Apply over primary dressing as directed. Woven Gauze Sponge, Non-Sterile 4x4 in Discharge Instruction: can use gauze between toes (if no desired) Zetuvit Plus 4x8 in Discharge Instruction: Apply over primary dressing as directed. Secured With Compression Wrap Urgo K2 Lite, (equivalent to a 3  layer) two layer compression system, regular Discharge Instruction: Apply Urgo K2 Lite as directed (alternative to 3 layer compression). Compression Stockings Add-Ons Electronic Signature(s) Signed: 01/30/2023 3:05:26 PM By: [name] Signed: 01/30/2023 4:08:12 PM By: [name] Entered By: [name] on 01/30/2023 11:31:54 Branscom, Kaslyn ([id number]) [id number] Page 15 of 15 -------------------------------------------------------------------------------- Vitals Details Patient Name: Date of Service: [name] 01/30/2023 2:15 PM Medical Record Number: [id number] Patient Account Number: [id number] Date of Birth/Sex: Treating RN: [birth year] (82 y.o. [name] Primary Care Glenis Musolf: [name], [facility] Other Clinician: Referring Cinda Hara: Treating Giara Mcgaughey/Extender: [name] SHO [name], [facility] Weeks in Treatment: 42 Vital Signs Time Taken: 14:18 Temperature (F): 97.7 Height (in): 67 Pulse (bpm): 99 Weight (lbs): 153 Respiratory Rate (breaths/min): 16 Body Mass Index (BMI): 24 Blood Pressure (mmHg): 103/67 Reference Range: 80 - 120 mg / dl Electronic Signature(s) Signed: 01/30/2023 4:08:12 PM By: [name] Entered By: [name] on 01/30/2023 11:18:31

## 2023-01-31 ENCOUNTER — Ambulatory Visit (HOSPITAL_BASED_OUTPATIENT_CLINIC_OR_DEPARTMENT_OTHER): Payer: Medicare Other | Admitting: General Surgery

## 2023-01-31 NOTE — Progress Notes (Signed)
LANNA, LABELLA ([id number]) [id number] Page 1 of 18 Visit Report for 01/30/2023 Chief Complaint Document Details Patient Name: Date of Service: [name] 01/30/2023 2:15 PM Medical Record Number: [id number] Patient Account Number: [id number] Date of Birth/Sex: Treating RN: 1940-05-07 (82 y.o. F) Primary Care Provider: [name], [facility] Other Clinician: Referring Provider: Treating Provider/Extender: [name], [facility] Weeks in Treatment: 48 Information Obtained from: Patient Chief Complaint Patient seen for complaints of Non-Healing Wound. Electronic Signature(s) Signed: 01/30/2023 3:00:04 PM By: [name] MD FACS Entered By: [name] on 01/30/2023 12:00:04 -------------------------------------------------------------------------------- Debridement Details Patient Name: Date of Service: [name], [facility] RO N 01/30/2023 2:15 PM Medical Record Number: [id number] Patient Account Number: [id number] Date of Birth/Sex: Treating RN: [birth year] (82 y.o. [name] Primary Care Provider: [name], [facility] Other Clinician: Referring Provider: Treating Provider/Extender: [name], [facility] Weeks in Treatment: 33 Debridement Performed for Assessment: Wound #9 Left,Lateral T Great oe Performed By: Physician [name], MD The following information was scribed by: [name] The information was scribed for: [name] Debridement Type: Debridement Level of Consciousness (Pre-procedure): Awake and Alert Pre-procedure Verification/Time Out Yes - 14:40 Taken: Start Time: 14:40 Pain Control: Lidocaine 4% T opical Solution Percent of Wound Bed Debrided: 100% T Area Debrided (cm): otal 0.16 Tissue and other material debrided: Non-Viable, Slough, Slough Level: Non-Viable Tissue Debridement Description: Selective/Open Wound Instrument: Curette Bleeding: Minimum Hemostasis Achieved:  Pressure Response to Treatment: Procedure was tolerated well Level of Consciousness (Post- Awake and Alert procedure): Post Debridement Measurements of Total Wound Length: (cm) 0.4 Width: (cm) 0.5 Depth: (cm) 0.1 Volume: (cm) 0.016 Character of Wound/Ulcer Post Debridement: Improved Post Procedure Diagnosis Joseph, Jillann ([id number]) [id number] Page 2 of 18 Same as Pre-procedure Electronic Signature(s) Signed: 01/30/2023 4:08:12 PM By: [name] Signed: 01/31/2023 7:55:21 AM By: [name] MD FACS Entered By: [name] on 01/30/2023 11:41:24 -------------------------------------------------------------------------------- Debridement Details Patient Name: Date of Service: [name], [facility] RO N 01/30/2023 2:15 PM Medical Record Number: [id number] Patient Account Number: [id number] Date of Birth/Sex: Treating RN: 1940-05-01 (82 y.o. [name] Primary Care Provider: [name], [facility] Other Clinician: Referring Provider: Treating Provider/Extender: [name], [facility] Weeks in Treatment: 33 Debridement Performed for Assessment: Wound #10 Left T Second oe Performed By: Physician [name], MD The following information was scribed by: [name] The information was scribed for: [name] Debridement Type: Debridement Level of Consciousness (Pre-procedure): Awake and Alert Pre-procedure Verification/Time Out Yes - 14:40 Taken: Start Time: 14:40 Pain Control: Lidocaine 4% T opical Solution Percent of Wound Bed Debrided: 100% T Area Debrided (cm): otal 0.16 Tissue and other material debrided: Non-Viable, Slough, Slough Level: Non-Viable Tissue Debridement Description: Selective/Open Wound Instrument: Curette Bleeding: Minimum Hemostasis Achieved: Pressure Response to Treatment: Procedure was tolerated well Level of Consciousness (Post- Awake and Alert procedure): Post Debridement  Measurements of Total Wound Length: (cm) 0.4 Width: (cm) 0.5 Depth: (cm) 0.1 Volume: (cm) 0.016 Character of Wound/Ulcer Post Debridement: Improved Post Procedure Diagnosis Same as Pre-procedure Electronic Signature(s) Signed: 01/30/2023 4:08:12 PM By: [name] Signed: 01/31/2023 7:55:21 AM By: [name] MD FACS Entered By: [name] on 01/30/2023 11:41:45 -------------------------------------------------------------------------------- Debridement Details Patient Name: Date of Service: [name], [facility] RO N 01/30/2023 2:15 PM Medical Record Number: [id number] Patient Account Number: [id number] Date of Birth/Sex: Treating RN: [birth year] (82 y.o. [name] Primary Care Provider: [name], [facility] Other Clinician: AMILIA, VANDENBRINK ([id number]) [id number] Page 3 of 18 Referring Provider: Treating Provider/Extender: Lady Gary,  Arieanna Pressey SHO KES, [place] Weeks in Treatment: 33 Debridement Performed for Assessment: Wound #6 Left,Dorsal Foot Performed By: Physician [name], MD The following information was scribed by: [name] The information was scribed for: [name] Debridement Type: Debridement Level of Consciousness (Pre-procedure): Awake and Alert Pre-procedure Verification/Time Out Yes - 14:40 Taken: Start Time: 14:40 Pain Control: Lidocaine 4% T opical Solution Percent of Wound Bed Debrided: 100% T Area Debrided (cm): otal 3.07 Tissue and other material debrided: Non-Viable, Slough, Slough Level: Non-Viable Tissue Debridement Description: Selective/Open Wound Instrument: Curette Bleeding: Minimum Hemostasis Achieved: Pressure Response to Treatment: Procedure was tolerated well Level of Consciousness (Post- Awake and Alert procedure): Post Debridement Measurements of Total Wound Length: (cm) 1.7 Width: (cm) 2.3 Depth: (cm) 0.1 Volume: (cm) 0.307 Character of Wound/Ulcer Post Debridement:  Improved Post Procedure Diagnosis Same as Pre-procedure Electronic Signature(s) Signed: 01/30/2023 4:08:12 PM By: [name] Signed: 01/31/2023 7:55:21 AM By: [name] MD FACS Entered By: [name] on 01/30/2023 11:42:12 -------------------------------------------------------------------------------- Debridement Details Patient Name: Date of Service: [name], [facility] RO N 01/30/2023 2:15 PM Medical Record Number: [id number] Patient Account Number: [id number] Date of Birth/Sex: Treating RN: 04/27/1940 (82 y.o. [name] Primary Care Provider: [name], [facility] Other Clinician: Referring Provider: Treating Provider/Extender: [name], [facility] Weeks in Treatment: 33 Debridement Performed for Assessment: Wound #8 Left,Lateral Foot Performed By: Physician [name], MD The following information was scribed by: [name] The information was scribed for: [name] Debridement Type: Debridement Level of Consciousness (Pre-procedure): Awake and Alert Pre-procedure Verification/Time Out Yes - 14:40 Taken: Start Time: 14:40 Pain Control: Lidocaine 4% T opical Solution Percent of Wound Bed Debrided: 100% T Area Debrided (cm): otal 0.38 Tissue and other material debrided: Non-Viable, Slough, Slough Level: Non-Viable Tissue Debridement Description: Selective/Open Wound Instrument: Curette Bleeding: Minimum Hemostasis Achieved: Pressure Response to Treatment: Procedure was tolerated well Level of Consciousness (Post- Awake and Alert Lingenfelter, Azalyn ([id number]) [id number] Page 4 of 18 Awake and Alert procedure): Post Debridement Measurements of Total Wound Length: (cm) 0.7 Stage: Category/Stage IV Width: (cm) 0.7 Depth: (cm) 0.2 Volume: (cm) 0.077 Character of Wound/Ulcer Post Debridement: Improved Post Procedure Diagnosis Same as Pre-procedure Electronic Signature(s) Signed: 01/30/2023  4:08:12 PM By: [name] Signed: 01/31/2023 7:55:21 AM By: [name] MD FACS Entered By: [name] on 01/30/2023 11:42:48 -------------------------------------------------------------------------------- Debridement Details Patient Name: Date of Service: [name], [facility] RO N 01/30/2023 2:15 PM Medical Record Number: [id number] Patient Account Number: [id number] Date of Birth/Sex: Treating RN: 11-Oct-1940 (82 y.o. [name] Primary Care Provider: [name], [facility] Other Clinician: Referring Provider: Treating Provider/Extender: [name], [facility] Weeks in Treatment: 33 Debridement Performed for Assessment: Wound #12 Left,Plantar T Third oe Performed By: Physician [name], MD The following information was scribed by: [name] The information was scribed for: [name] Debridement Type: Debridement Level of Consciousness (Pre-procedure): Awake and Alert Pre-procedure Verification/Time Out Yes - 14:40 Taken: Start Time: 14:40 Pain Control: Lidocaine 4% T opical Solution Percent of Wound Bed Debrided: 100% T Area Debrided (cm): otal 0.31 Tissue and other material debrided: Non-Viable, Slough, Slough Level: Non-Viable Tissue Debridement Description: Selective/Open Wound Instrument: Curette Bleeding: Minimum Hemostasis Achieved: Pressure Response to Treatment: Procedure was tolerated well Level of Consciousness (Post- Awake and Alert procedure): Post Debridement Measurements of Total Wound Length: (cm) 0.4 Width: (cm) 1 Depth: (cm) 0.1 Volume: (cm) 0.031 Character of Wound/Ulcer Post Debridement: Improved Post Procedure Diagnosis Same as Pre-procedure Electronic Signature(s) Signed: 01/30/2023 4:08:12 PM By:  [name] Signed: 01/31/2023 7:55:21 AM By: [name] MD FACS Entered By: [name] on 01/30/2023 11:43:15 [place], [name] ([id number]) [id number]  Page 5 of 18 -------------------------------------------------------------------------------- HPI Details Patient Name: Date of Service: [name] 01/30/2023 2:15 PM Medical Record Number: [id number] Patient Account Number: [id number] Date of Birth/Sex: Treating RN: November 06, 1940 (82 y.o. F) Primary Care Provider: [name], [facility] Other Clinician: Referring Provider: Treating Provider/Extender: [name], [facility] Weeks in Treatment: 93 History of Present Illness HPI Description: ADMISSION 06/12/2022 This is an 82 year old woman with a history of CVA, crest syndrome, atrial fibrillation, rocker-bottom foot deformity. 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 She has been followed by podiatry for this and I am not entirely clear as to how she came to be referred here. She resides in an assisted living facility. It is not clear what they have been putting on her wound, but on intake, she was noted to have denuded skin on her medial third toe, as well as ulcers on her dorsal great toe and lateral great toe. There is slough accumulation on both of the toe ulcers. There was an odor noted at intake, but after her foot was washed, the odor dissipated. Her toes are folded on top of each other creating areas of abrasion and pockets for moisture collection, which seems to be the primary cause of her ulceration. 06/20/2022: The skin between her toes and on the ball of her foot is completely macerated. There has been more tissue breakdown. The wound on her great toe has some slough accumulation. 06/27/2022: No change to her wounds today. There has been no further deterioration, but no significant improvement. She was both hypotensive and bradycardic on intake. 07/11/2022: Today, her foot is completely macerated. She reports that the wound care nurse actually soaked her foot and then applied foam, despite our specific orders to not use foam at  all. She also is draining serous fluid from both legs and has 2+ pitting edema. She is on furosemide 20 mg twice a day. 07/19/2022: Once again, her foot is completely macerated. There has been further tissue breakdown to the second and third toes and she now has ulcers on the distal ends. The wounds on her medial and lateral great toe have thick slough accumulation. Apparently Xeroform was found between her toes on intake. Edema control is improved, but still not perfect. No overt drainage from her legs appreciated on exam today. 07/27/2022: She has less tissue maceration today. Edema control in her bilateral lower extremities is improved. Still with slough accumulation on all open wound surfaces. 08/08/2022: Significant improvement this week. She has very little tissue maceration and according to her aide, there has been very little drainage from her legs. There is some slough accumulation on the medial great toe ulcer. Edema control is improved bilaterally. She is spending more time in her bed with her legs elevated and less in her wheelchair with her legs in a dependent position. 08/20/2022: The edema in her legs is now well-controlled with the use of the zinc Unna boots. Unfortunately, this seems to have resulted in more drainage coming from the open areas of her feet. They are a bit macerated but there has not been as much tissue breakdown secondary to moisture as we have seen on previous visits. 08/28/2022: The only remaining open wound in her foot is on the dorsal great toe. The improvement in the rest of the foot is quite  dramatic, with no tissue maceration or breakdown. She has been elevating her legs and wearing compression wraps. Edema control is excellent and there has been no drainage from her legs. 09/05/2022: Unfortunately, the distal half of her dorsal foot has broken down and has a layer of slough on the surface. This appears to be secondary to moisture accumulation. Her dressing was  completely saturated. 09/20/2022: There has been further deterioration of her foot. The dressing that was applied was bizarre and involved Coflex foam wrapped around her foot to the ankle and then Kerlix and Coban over that to the knee. She fortunately did have silver alginate between her toes but the tissue breakdown from moisture is extensive. 09/25/2022: There has been massive improvement in her foot since last week. The maceration has decreased, edema control is markedly better, and the wounds are showing evidence of healing, as opposed to worsening. 10/03/2022: The wound on her great toe is much smaller with minimal slough accumulation. The dorsal foot is also improving. She has a new ulcer on the plantar surface of her left fourth toe, however, and bone is exposed. Edema control and tissue maceration continues to be significantly better now that we are doing all of the dressing care. 10/09/2022: Unfortunately, it seems that the patient has resumed her habit of sitting in her wheelchair with her legs in a dependent position and there has been a lot more drainage and maceration on her foot. The dorsal foot wounds have expanded and are deeper. She has a new wound on her second toe and although the initial wound on her great toe has healed, she has a new wound on the lateral aspect of her great toe, immediately adjacent to that on her second toe, suggesting these have been caused by friction of the 2 toes rubbing against each other. Her son is participating in this visit via speaker phone. 10/24/2022: Last week she had a nurse visit due to clinic capacity and it appears that her dressing was done differently by the nurse that saw her then how it is usually performed by her regular nurse. Unfortunately, this meant there was more moisture-related tissue breakdown. The dorsum of her foot is open again and she has a new wound on the lateral aspect of her fifth toe. There is slough accumulation in each of the  sites. 11/01/2022: There has been remarkable turnaround since her last visit. All of the wounds are smaller. The wound on the dorsal aspect of her great toe has closed completely. The wounds on the top of her foot have contracted and are starting to epithelialize. The new wound on the lateral aspect of her fifth toe does have some tendon exposure today that was not appreciated at her last visit. There is more tissue over the exposed bone on the plantar surface of her fourth toe, although bone does still remain exposed. 11/08/2022: All of her wounds are looking better again this week. The exposed bone on the plantar surface of the fourth toe has now been covered with soft tissue. The dorsal foot wounds are epithelializing nicely. The lateral fifth toe wound is smaller. There is slough on all of the surfaces. She had ABIs done in the vascular lab last week. Results are copied here: ABI FindingsCandita Borenstein, [place] ([id number]) [id number] Page 6 of 18 +---------+------------------+-----+----------+--------+ Right Rt Pressure (mmHg)IndexWaveform Comment  +---------+------------------+-----+----------+--------+ Brachial 132     +---------+------------------+-----+----------+--------+ PTA 108 0.82 monophasic  +---------+------------------+-----+----------+--------+ DP 81 0.61 monophasic  +---------+------------------+-----+----------+--------+ Great T oe24 0.18 Abnormal   +---------+------------------+-----+----------+--------+ +---------+------------------+-----+----------+--------------------------------+  Left Lt Pressure (mmHg)IndexWaveform Comment  +---------+------------------+-----+----------+--------------------------------+ Brachial 121     +---------+------------------+-----+----------+--------------------------------+ PTA 88 0.67 monophasic   +---------+------------------+-----+----------+--------------------------------+ DP 111 0.84 monophasic  +---------+------------------+-----+----------+--------------------------------+ Great T   unable to obtain due to  oe     bandaging/ wound  +---------+------------------+-----+----------+--------------------------------+ +-------+-----------+-----------+------------+------------+ ABI/TBIT oday's ABIT oday's TBIPrevious ABIPrevious TBI +-------+-----------+-----------+------------+------------+ Right 0.82 0.18    +-------+-----------+-----------+------------+------------+ Left 0.84     +-------+-----------+-----------+------------+------------+ Summary: Right: Resting right ankle-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 The right toe-brachial index is abnormal. Left: Resting left ankle-brachial index indicates mild left lower extremity arterial disease. Unable to obtain TBI due to bandaging/wound. *See table(s) above for measurements and observations. 11/16/2022: All of her wounds are improving. The wound on the plantar surface of the fourth toe has good tissue covering the bone. The dorsal foot areas are epithelializing. The lateral foot ulcer still has muscle exposed; this is probably the most severe of her wounds at this time. She is scheduled to see vascular surgery on August 20. 11/20/2022: The wound on the plantar surface of the fourth toe seems to be closed. The dorsal foot areas are smaller. The lateral foot ulcer has muscle and tendon exposed. The medial great toe ulcer has bone exposed and the lateral great toe ulcer just has some slough on the surface. Unfortunately, the dorsal second toe ulcer now also has tendon exposure. 12/06/2022: The dorsal foot areas continue to contract. The lateral foot ulcer has some tendon exposure, but there is no longer any necrotic muscle or other nonviable soft tissue. The medial  great toe ulcer is about the same with bone exposure. The dorsal second toe ulcer also has bone exposure. She has a new wound on her right anterior tibial surface. The fat layer is exposed but it is fairly small. It is clean without any slough or eschar accumulation. 12/27/2022: I have not seen this patient in 3 weeks; she had a nurse visit 1 week after the last time I saw her and then has been unable to attend the subsequent visits for various reasons. She did undergo angiography on August 23. 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 There were no options for revascularization. Her wraps were on for 2 weeks and were fairly soupy when they were removed. The plantar fourth toe wound has reopened with bone exposure. The rest of the wounds are all about the same. 01/04/2023: No significant change to any of the wounds. 01/16/2023: Her wounds are all about the same. The dressings were on for almost 2 weeks and were little bit soupy when removed. 01/22/2023: The wounds are basically unchanged, but they were not as soupy as they were last week when her dressings were removed. There is slough accumulation at all of the surfaces. Bone remains exposed on the toe ulcers. There is no real necrosis visible at the lateral foot ulcer. The patient and her aide report that after discussion with the patient's son, the patient feels like she is interested in pursuing a surgical option with Dr. Lenell Antu. 01/30/2023: The wounds are little bit smaller today, but still with substantial slough accumulation and thick drainage. The wound on the plantar surface of the fourth toe has closed but she has opened a new wound on the plantar surface of her third toe. Bone remains exposed on the first and second toe wounds. The lateral fifth  toe ulcer is more  superficial. Electronic Signature(s) Signed: 01/30/2023 3:01:08 PM By: [name] MD FACS Entered By: [name] on 01/30/2023 12:01:08 Physical Exam Details -------------------------------------------------------------------------------- Marsh Dolly ([id number]) [id number] Page 7 of 18 Patient Name: Date of Service: [name] 01/30/2023 2:15 PM Medical Record Number: [id number] Patient Account Number: [id number] Date of Birth/Sex: Treating RN: [birth year] (82 y.o. F) Primary Care Provider: [name], [facility] Other Clinician: Referring Provider: Treating Provider/Extender: [name] SHO KES, [facility] Weeks in Treatment: 68 Constitutional . . . . no acute distress. Respiratory Normal work of breathing on room air. Notes 01/30/2023: The wounds are little bit smaller today, but still with substantial slough accumulation and thick drainage. The wound on the plantar surface of the fourth toe has closed but she has opened a new wound on the plantar surface of her third toe. Bone remains exposed on the first and second toe wounds. The lateral fifth toe ulcer is more superficial. Electronic Signature(s) Signed: 01/30/2023 3:03:23 PM By: [name] MD FACS Entered By: [name] on 01/30/2023 12:03:23 -------------------------------------------------------------------------------- Physician Orders Details Patient Name: Date of Service: [name], [facility] RO N 01/30/2023 2:15 PM Medical Record Number: [id number] Patient Account Number: [id number] Date of Birth/Sex: Treating RN: 01/09/1941 (82 y.o. 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 - Dr. Lady Gary - room 2 Return A Other: - call Dr. Lenell Antu to discuss further about your foot. Anesthetic (In clinic) Topical Lidocaine 4% applied to wound bed - USED in Clinic Bathing/ Shower/ Hygiene May shower with protection but do not get wound dressing(s) wet. Protect dressing(s) with water repellant cover (for example, large plastic bag) or a cast cover and may then take shower. Edema Control - Lymphedema / SCD / Other Elevate legs to the level of the heart or above for 30 minutes daily and/or when sitting for 3-4 times a day throughout the day. Avoid standing for long periods of time. Non Wound Condition Right Lower Extremity Klink, Derrika ([id number]) [id number] Page 8 of 18 Other Non Wound Condition Orders/Instructions: - urgo lite or 3 layer compression wrap on right and left lower leg to be changed by facility Wound Treatment Wound #10 - T Second oe Wound Laterality: Left Cleanser: Soap and Water 1 x Per Week/30 Days Discharge Instructions: May shower and wash wound with dial antibacterial soap and water prior to dressing change. Cleanser: Wound Cleanser 1 x Per Week/30 Days Discharge Instructions: Cleanse the wound with wound cleanser prior to applying a clean dressing using gauze  sponges, not tissue or cotton balls. Peri-Wound Care: Sween Lotion (Moisturizing lotion) 1 x Per Week/30 Days Discharge Instructions: Apply moisturizing lotion as directed Topical: Ketoconazole Cream 2% 1 x Per Week/30 Days Discharge Instructions: Apply  Ketoconazole as directed Topical: zinc 1 x Per Week/30 Days Prim Dressing: Maxorb Extra Ag+ Alginate Dressing, 2x2 (in/in) 1 x Per Week/30 Days ary Discharge Instructions: Apply to wound bed as instructed Secondary Dressing: Drawtex 4x4 in 1 x Per Week/30 Days Discharge Instructions: Apply over primary dressing as directed. Secondary Dressing: Woven Gauze Sponge, Non-Sterile 4x4 in 1 x Per Week/30 Days Discharge Instructions: can use gauze between toes (if no desired) Secondary Dressing: Zetuvit Plus 4x8 in 1 x Per Week/30 Days Discharge Instructions: Apply over primary dressing as directed. Compression Wrap: Urgo K2 Lite, (equivalent to a 3 layer) two layer compression system, regular 1 x Per Week/30 Days Discharge Instructions: Apply Urgo K2 Lite as directed (alternative to 3 layer compression). Wound #12 - T Third oe Wound Laterality: Plantar, Left Cleanser: Soap and Water 1 x Per Week/30 Days Discharge Instructions: May shower and wash wound with dial antibacterial soap and water prior to dressing change. Cleanser: Wound Cleanser 1 x Per Week/30 Days Discharge Instructions: Cleanse the wound with wound cleanser prior to applying a clean dressing using gauze sponges, not tissue or cotton balls. Peri-Wound Care: Sween Lotion (Moisturizing lotion) 1 x Per Week/30 Days Discharge Instructions: Apply moisturizing lotion as directed Topical: Ketoconazole Cream 2% 1 x Per Week/30 Days Discharge Instructions: Apply Ketoconazole as directed Topical: zinc 1 x Per Week/30 Days Prim Dressing: Maxorb Extra Ag+ Alginate Dressing, 2x2 (in/in) 1 x Per Week/30 Days ary Discharge Instructions: Apply to wound bed as instructed Secondary Dressing:  Drawtex 4x4 in 1 x Per Week/30 Days Discharge Instructions: Apply over primary dressing as directed. Secondary Dressing: Woven Gauze Sponge, Non-Sterile 4x4 in 1 x Per Week/30 Days Discharge Instructions: can use gauze between toes (if no desired) Secondary Dressing: Zetuvit Plus 4x8 in 1 x Per Week/30 Days Discharge Instructions: Apply over primary dressing as directed. Compression Wrap: Urgo K2 Lite, (equivalent to a 3 layer) two layer compression system, regular 1 x Per Week/30 Days Discharge Instructions: Apply Urgo K2 Lite as directed (alternative to 3 layer compression). Wound #6 - Foot Wound Laterality: Dorsal, Left Cleanser: Soap and Water 1 x Per Week/30 Days Discharge Instructions: May shower and wash wound with dial antibacterial soap and water prior to dressing change. Cleanser: Wound Cleanser 1 x Per Week/30 Days Discharge Instructions: Cleanse the wound with wound cleanser prior to applying a clean dressing using gauze sponges, not tissue or cotton balls. Peri-Wound Care: Sween Lotion (Moisturizing lotion) 1 x Per Week/30 Days Discharge Instructions: Apply moisturizing lotion as directed Topical: Ketoconazole Cream 2% 1 x Per Week/30 Days Discharge Instructions: Apply Ketoconazole as directed Topical: zinc 1 x Per Week/30 Days Rothbauer, Shelly ([id number]) [id number].pdf Page 9 of 18 Prim Dressing: Maxorb Extra Ag+ Alginate Dressing, 2x2 (in/in) 1 x Per Week/30 Days ary Discharge Instructions: Apply to wound bed as instructed Secondary Dressing: Drawtex 4x4 in 1 x Per Week/30 Days Discharge Instructions: Apply over primary dressing as directed. Secondary Dressing: Woven Gauze Sponge, Non-Sterile 4x4 in 1 x Per Week/30 Days Discharge Instructions: can use gauze between toes (if no desired) Secondary Dressing: Zetuvit Plus 4x8 in 1 x Per Week/30 Days Discharge Instructions: Apply over primary dressing as directed. Compression Wrap: Urgo K2 Lite,  (equivalent to a 3 layer) two layer compression system, regular 1 x Per Week/30 Days Discharge Instructions: Apply Urgo K2 Lite as directed (alternative to 3 layer compression). Wound #8 - Foot Wound Laterality: Left, Lateral Cleanser: Soap and Water 1 x Per Week/30 Days Discharge Instructions: May shower and  wash wound with dial antibacterial soap and water prior to dressing change. Cleanser: Wound Cleanser 1 x Per Week/30 Days Discharge Instructions: Cleanse the wound with wound cleanser prior to applying a clean dressing using gauze sponges, not tissue or cotton balls. Peri-Wound Care: Sween Lotion (Moisturizing lotion) 1 x Per Week/30 Days Discharge Instructions: Apply moisturizing lotion as directed Topical: Ketoconazole Cream 2% 1 x Per Week/30 Days Discharge Instructions: Apply Ketoconazole as directed Topical: zinc 1 x Per Week/30 Days Prim Dressing: Maxorb Extra Ag+ Alginate Dressing, 2x2 (in/in) 1 x Per Week/30 Days ary Discharge Instructions: Apply to wound bed as instructed Secondary Dressing: Drawtex 4x4 in 1 x Per Week/30 Days Discharge Instructions: Apply over primary dressing as directed. Secondary Dressing: Woven Gauze Sponge, Non-Sterile 4x4 in 1 x Per Week/30 Days Discharge Instructions: can use gauze between toes (if no desired) Secondary Dressing: Zetuvit Plus 4x8 in 1 x Per Week/30 Days Discharge Instructions: Apply over primary dressing as directed. Compression Wrap: Urgo K2 Lite, (equivalent to a 3 layer) two layer compression system, regular 1 x Per Week/30 Days Discharge Instructions: Apply Urgo K2 Lite as directed (alternative to 3 layer compression). Wound #9 - T Great oe Wound Laterality: Left, Lateral Cleanser: Soap and Water 1 x Per Week/30 Days Discharge Instructions: May shower and wash wound with dial antibacterial soap and water prior to dressing change. Cleanser: Wound Cleanser 1 x Per Week/30 Days Discharge Instructions: Cleanse the wound with wound  cleanser prior to applying a clean dressing using gauze sponges, not tissue or cotton balls. Peri-Wound Care: Sween Lotion (Moisturizing lotion) 1 x Per Week/30 Days Discharge Instructions: Apply moisturizing lotion as directed Topical: Ketoconazole Cream 2% 1 x Per Week/30 Days Discharge Instructions: Apply Ketoconazole as directed Topical: zinc 1 x Per Week/30 Days Prim Dressing: Maxorb Extra Ag+ Alginate Dressing, 2x2 (in/in) 1 x Per Week/30 Days ary Discharge Instructions: Apply to wound bed as instructed Secondary Dressing: Drawtex 4x4 in 1 x Per Week/30 Days Discharge Instructions: Apply over primary dressing as directed. Secondary Dressing: Woven Gauze Sponge, Non-Sterile 4x4 in 1 x Per Week/30 Days Discharge Instructions: can use gauze between toes (if no desired) Secondary Dressing: Zetuvit Plus 4x8 in 1 x Per Week/30 Days Discharge Instructions: Apply over primary dressing as directed. Compression Wrap: Urgo K2 Lite, (equivalent to a 3 layer) two layer compression system, regular 1 x Per Week/30 Days Discharge Instructions: Apply Urgo K2 Lite as directed (alternative to 3 layer compression). AMAHIA, MADONIA ([id number]) [id number] Page 10 of 18 Patient Medications llergies: Sulfa (Sulfonamide Antibiotics) A Notifications Medication Indication Start End 01/30/2023 lidocaine DOSE topical 4 % cream - cream topical Electronic Signature(s) Signed: 01/31/2023 7:55:21 AM By: [name] MD FACS Entered By: [name] on 01/30/2023 12:03:41 -------------------------------------------------------------------------------- Problem List Details Patient Name: Date of Service: [name], [facility] RO N 01/30/2023 2:15 PM Medical Record Number: [id number] Patient Account Number: [id number] Date of Birth/Sex: Treating RN: April 26, 1940 (82 y.o. 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 11/01/2022 No Yes without evidence of necrosis 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 06/12/2022 No Yes I10 Essential (  primary) hypertension 06/12/2022 No Yes 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 06/12/2022 No Yes 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 atrial fibrillation 06/12/2022 No Yes Z79.01 Long term (current) use of anticoagulants 06/12/2022 No Yes I89.0 Lymphedema, not elsewhere classified 08/20/2022 No Yes Weiher, Joscelynn ([id number]) [id number].pdf Page 11 of 18 Inactive Problems ICD-10 Code Description Active Date Inactive Date L97.521 Non-pressure chronic ulcer of other part of left foot limited to breakdown of skin 06/12/2022 06/12/2022 Resolved Problems ICD-10 Code Description Active Date Resolved Date L97.812 Non-pressure chronic ulcer of other part of right lower leg with fat layer exposed 12/06/2022 12/06/2022 Electronic Signature(s) Signed: 01/30/2023 2:59:42 PM By: [name] MD FACS Entered By: [name] on 01/30/2023 11:59:41 -------------------------------------------------------------------------------- Progress Note Details Patient Name: Date of Service: [place] [name], [facility] RO N 01/30/2023 2:15 PM Medical Record Number: [id number] Patient Account Number: [id number] Date of Birth/Sex: Treating RN: Mar 30, 1941 (82 y.o. F) Primary Care Provider: [name], [facility] Other Clinician: Referring Provider: Treating Provider/Extender: [name], [facility] Weeks in Treatment: 32 Subjective Chief  Complaint Information obtained from Patient Patient seen for complaints of Non-Healing Wound. History of Present Illness (HPI) ADMISSION 06/12/2022 This is an 82 year old woman with a history of CVA, crest syndrome, atrial fibrillation, rocker-bottom foot deformity. 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 She has been followed by podiatry for this and I am not entirely clear as to how she came to be referred here. She resides in an assisted living facility. It is not clear what they have been putting on her wound, but on intake, she was noted to have denuded skin on her medial third toe, as well as ulcers on her dorsal great toe and lateral great toe. There is slough accumulation on both of the toe ulcers. There was an odor noted at intake, but after her foot was washed, the odor dissipated. Her toes are folded on top of each other creating areas of abrasion and pockets for moisture collection, which seems to be the primary cause of her ulceration. 06/20/2022: The skin between her toes and on the ball of her foot is completely macerated. There has been more tissue breakdown. The wound on her great toe has some slough accumulation. 06/27/2022: No change to her wounds today. There has been no further deterioration, but no significant improvement. She was both hypotensive and bradycardic on intake. 07/11/2022: Today, her foot is completely macerated. She reports that the wound care nurse actually soaked her foot and then applied foam, despite our specific orders to not use foam at all. She also is draining serous fluid from both legs and has 2+ pitting edema. She is on furosemide 20 mg twice a day. 07/19/2022: Once again, her foot is completely macerated. There has been further tissue breakdown to the second and third toes and she now has ulcers on the distal ends. The wounds on her medial and lateral great toe have thick slough accumulation. Apparently  Xeroform was found between her toes on intake. Edema control is improved, but still not perfect. No overt drainage from her legs appreciated on exam today. 07/27/2022: She has less tissue maceration today. Edema control in her bilateral lower extremities is improved. Still with slough accumulation on all open wound surfaces. 08/08/2022: Significant improvement this week. She has very little tissue maceration and according to her aide, there has been very little drainage from her legs. There is some slough accumulation on the medial great toe ulcer. Edema  control is improved bilaterally. She is spending more time in her bed with her legs elevated and less in her wheelchair with her legs in a dependent position. 08/20/2022: The edema in her legs is now well-controlled with the use of the zinc Unna boots. Unfortunately, this seems to have resulted in more drainage coming from the open areas of her feet. They are a bit macerated but there has not been as much tissue breakdown secondary to moisture as we have seen on previous visits. 08/28/2022: The only remaining open wound in her foot is on the dorsal great toe. The improvement in the rest of the foot is quite dramatic, with no tissue maceration or breakdown. She has been elevating her legs and wearing compression wraps. Edema control is excellent and there has been no drainage from her legs. MOZETTA, MURFIN ([id number]) [id number] Page 12 of 18 09/05/2022: Unfortunately, the distal half of her dorsal foot has broken down and has a layer of slough on the surface. This appears to be secondary to moisture accumulation. Her dressing was completely saturated. 09/20/2022: There has been further deterioration of her foot. The dressing that was applied was bizarre and involved Coflex foam wrapped around her foot to the ankle and then Kerlix and Coban over that to the knee. She fortunately did have silver alginate between her toes but the  tissue breakdown from moisture is extensive. 09/25/2022: There has been massive improvement in her foot since last week. The maceration has decreased, edema control is markedly better, and the wounds are showing evidence of healing, as opposed to worsening. 10/03/2022: The wound on her great toe is much smaller with minimal slough accumulation. The dorsal foot is also improving. She has a new ulcer on the plantar surface of her left fourth toe, however, and bone is exposed. Edema control and tissue maceration continues to be significantly better now that we are doing all of the dressing care. 10/09/2022: Unfortunately, it seems that the patient has resumed her habit of sitting in her wheelchair with her legs in a dependent position and there has been a lot more drainage and maceration on her foot. The dorsal foot wounds have expanded and are deeper. She has a new wound on her second toe and although the initial wound on her great toe has healed, she has a new wound on the lateral aspect of her great toe, immediately adjacent to that on her second toe, suggesting these have been caused by friction of the 2 toes rubbing against each other. Her son is participating in this visit via speaker phone. 10/24/2022: Last week she had a nurse visit due to clinic capacity and it appears that her dressing was done differently by the nurse that saw her then how it is usually performed by her regular nurse. Unfortunately, this meant there was more moisture-related tissue breakdown. The dorsum of her foot is open again and she has a new wound on the lateral aspect of her fifth toe. There is slough accumulation in each of the sites. 11/01/2022: There has been remarkable turnaround since her last visit. All of the wounds are smaller. The wound on the dorsal aspect of her great toe has closed completely. The wounds on the top of her foot have contracted and are starting to epithelialize. The new wound on the lateral aspect of  her fifth toe does have some tendon exposure today that was not appreciated at her last visit. There is more tissue over the exposed bone on the plantar surface  of her fourth toe, although bone does still remain exposed. 11/08/2022: All of her wounds are looking better again this week. The exposed bone on the plantar surface of the fourth toe has now been covered with soft tissue. The dorsal foot wounds are epithelializing nicely. The lateral fifth toe wound is smaller. There is slough on all of the surfaces. She had ABIs done in the vascular lab last week. Results are copied here: ABI Findings: +---------+------------------+-----+----------+--------+ Right Rt Pressure (mmHg)IndexWaveform Comment  +---------+------------------+-----+----------+--------+ Brachial 132     +---------+------------------+-----+----------+--------+ PTA 108 0.82 monophasic  +---------+------------------+-----+----------+--------+ DP 81 0.61 monophasic  +---------+------------------+-----+----------+--------+ Great T oe24 0.18 Abnormal   +---------+------------------+-----+----------+--------+ +---------+------------------+-----+----------+--------------------------------+ Left Lt Pressure (mmHg)IndexWaveform Comment  +---------+------------------+-----+----------+--------------------------------+ Brachial 121     +---------+------------------+-----+----------+--------------------------------+ PTA 88 0.67 monophasic  +---------+------------------+-----+----------+--------------------------------+ DP 111 0.84 monophasic  +---------+------------------+-----+----------+--------------------------------+ Great T   unable to obtain due to  oe     bandaging/ wound  +---------+------------------+-----+----------+--------------------------------+ +-------+-----------+-----------+------------+------------+ ABI/TBIT oday's ABIT oday's TBIPrevious  ABIPrevious TBI +-------+-----------+-----------+------------+------------+ Right 0.82 0.18    +-------+-----------+-----------+------------+------------+ Left 0.84     +-------+-----------+-----------+------------+------------+ Summary: Right: Resting right ankle-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 The right toe-brachial index is abnormal. Left: Resting left ankle-brachial index indicates mild left lower extremity arterial disease. Unable to obtain TBI due to bandaging/wound. *See table(s) above for measurements and observations. 11/16/2022: All of her wounds are improving. The wound on the plantar surface of the fourth toe has good tissue covering the bone. The dorsal foot areas are epithelializing. The lateral foot ulcer still has muscle exposed; this is probably the most severe of her wounds at this time. She is scheduled to see vascular surgery on August 20. 11/20/2022: The wound on the plantar surface of the fourth toe seems to be closed. The dorsal foot areas are smaller. The lateral foot ulcer has muscle and tendon exposed. The medial great toe ulcer has bone exposed and the lateral great toe ulcer just has some slough on the surface. Unfortunately, the dorsal second toe ulcer now also has tendon exposure. 12/06/2022: The dorsal foot areas continue to contract. The lateral foot ulcer has some tendon exposure, but there is no longer any necrotic muscle or other nonviable soft tissue. The medial great toe ulcer is about the same with bone exposure. The dorsal second toe ulcer also has bone exposure. She has a new wound on her right anterior tibial surface. The fat layer is exposed but it is fairly small. It is clean without any slough or eschar accumulation. 12/27/2022: I have not seen this patient in 3 weeks; she had a nurse visit 1 week after the last time I saw her and then has been unable to attend the subsequent visits for various reasons.  She did undergo angiography on August 23. 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 There were no options for revascularization. Her wraps were on for 2 weeks and were fairly soupy when they were removed. The plantar fourth toe wound has reopened with bone exposure. The rest of the wounds are all about the same. 01/04/2023: No significant change to any of the wounds. 01/16/2023: Her wounds are all about the same. The dressings were on for almost 2 weeks and were little bit soupy when removed. 01/22/2023: The wounds are basically unchanged, but they were not as soupy as  they were last week when her dressings were removed. There is slough accumulation at all of the surfaces. Bone remains exposed on the toe ulcers. There is no real necrosis visible at the lateral foot ulcer. The patient and her aide report that after discussion with the patient's son, the patient feels like she is interested in pursuing a surgical option with Dr. Lenell Antu. 01/30/2023: The wounds are little bit smaller today, but still with substantial slough accumulation and thick drainage. The wound on the plantar surface of the fourth toe has closed but she has opened a new wound on the plantar surface of her third toe. Bone remains exposed on the first and second toe wounds. The lateral fifth toe ulcer is more superficial. Patient History Information obtained from Patient. Family History Unknown History. Social History Former smoker - smoked when she was young, Marital Status - Widowed, Alcohol Use - Never, Drug Use - No History, Caffeine Use - Daily. Medical History Hematologic/Lymphatic Patient has history of Anemia Cardiovascular Patient has history of Angina - a-fib,  Hypertension, Vasculitis Endocrine Denies history of Type I Diabetes, Type II Diabetes Musculoskeletal Patient has history of Osteoarthritis Hospitalization/Surgery History - cholecystectomy. - leg surgery. - tonsillectomy. 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 Vitals Time Taken: 2:18 PM, Height: 67 in, Weight: 153 lbs, BMI: 24, Temperature: 97.7 F, Pulse: 99 bpm, Respiratory Rate: 16 breaths/min, Blood Pressure: 103/67 mmHg. Respiratory Normal work of breathing on room air. General Notes: 01/30/2023: The wounds are little bit smaller today, but still with substantial slough accumulation and thick drainage. The wound on the plantar surface of the fourth toe has closed but she has opened a new wound on the plantar surface of her third toe. Bone remains exposed on the first and second toe wounds. The lateral fifth toe ulcer is more superficial. Integumentary (Hair, Skin) Wound #10 status is Open. Original cause of wound was Gradually Appeared. The date acquired was: 09/05/2022. The wound has been in treatment 7 weeks. The wound is located on the Left T Second. The wound measures 0.4cm length x 0.5cm width x 0.1cm depth; 0.157cm^2 area and 0.016cm^3 volume. There oe is Fat Layer (Subcutaneous Tissue) exposed. There is no tunneling or undermining noted. There is a medium amount of serosanguineous drainage noted. The wound margin is distinct with the outline attached to the wound base. There is small (1-33%) pink granulation within the wound bed. There is a large (67-100%) amount of necrotic tissue within the wound bed including Adherent Slough. The periwound skin appearance had no abnormalities noted for texture. The periwound skin appearance had no abnormalities noted for moisture. The periwound skin appearance had no abnormalities noted  for color. Periwound temperature was noted as No Abnormality. The periwound has tenderness on palpation. Wound #12 status is Open. Original cause of wound was Gradually Appeared. The date acquired was: 01/30/2023. The wound is located on the Left,Plantar T oe Third. The wound measures 0.4cm length x 1cm width x 0.1cm depth; 0.314cm^2 area and 0.031cm^3 volume. There is Fat Layer (Subcutaneous Tissue) exposed. There is no tunneling or undermining noted. There is a medium amount of serous drainage noted. The wound margin is distinct with the outline attached to the wound base. There is small (1-33%) red granulation within the wound bed. There is a large (67-100%) amount of necrotic tissue within the wound bed including Adherent Slough. The periwound skin appearance had no abnormalities  noted for color. The periwound skin appearance exhibited: Excoriation, Maceration. Periwound temperature was noted as No Abnormality. The periwound has tenderness on palpation. Wound #6 status is Open. Original cause of wound was Gradually Appeared. The date acquired was: 09/05/2022. The wound has been in treatment 21 weeks. VINCIE, LINN ([id number]) [id number] Page 14 of 18 The wound is located on the Left,Dorsal Foot. The wound measures 1.7cm length x 2.3cm width x 0.1cm depth; 3.071cm^2 area and 0.307cm^3 volume. There is Fat Layer (Subcutaneous Tissue) exposed. There is a medium amount of serosanguineous drainage noted. The wound margin is indistinct and nonvisible. There is small (1-33%) pink granulation within the wound bed. There is a large (67-100%) amount of necrotic tissue within the wound bed including Adherent Slough. The periwound skin appearance had no abnormalities noted for texture. The periwound skin appearance had no abnormalities noted for moisture. The periwound skin appearance exhibited: Rubor. Periwound temperature was noted as No Abnormality. The periwound has tenderness  on palpation. Wound #8 status is Open. Original cause of wound was Pressure Injury. The date acquired was: 10/24/2022. The wound has been in treatment 14 weeks. The wound is located on the Left,Lateral Foot. The wound measures 0.7cm length x 0.7cm width x 0.2cm depth; 0.385cm^2 area and 0.077cm^3 volume. There is Fat Layer (Subcutaneous Tissue) exposed. There is no tunneling or undermining noted. There is a medium amount of serosanguineous drainage noted. The wound margin is distinct with the outline attached to the wound base. There is no granulation within the wound bed. There is a large (67-100%) amount of necrotic tissue within the wound bed including Adherent Slough. The periwound skin appearance had no abnormalities noted for texture. The periwound skin appearance had no abnormalities noted for moisture. The periwound skin appearance had no abnormalities noted for color. Periwound temperature was noted as No Abnormality. The periwound has tenderness on palpation. Wound #9 status is Open. Original cause of wound was Gradually Appeared. The date acquired was: 11/01/2022. The wound has been in treatment 12 weeks. The wound is located on the [employer]. The wound measures 0.4cm length x 0.5cm width x 0.1cm depth; 0.157cm^2 area and 0.016cm^3 volume. There oe is bone and Fat Layer (Subcutaneous Tissue) exposed. There is no tunneling or undermining noted. There is a small amount of serous drainage noted. The wound margin is flat and intact. There is no granulation within the wound bed. There is a large (67-100%) amount of necrotic tissue within the wound bed including Adherent Slough. The periwound skin appearance had no abnormalities noted for texture. The periwound skin appearance had no abnormalities noted for moisture. The periwound skin appearance had no abnormalities noted for color. Periwound temperature was noted as No Abnormality. The periwound has tenderness on  palpation. 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 There was a Selective/Open Wound Non-Viable Tissue Debridement oe with a total area of 0.16 sq cm performed by [name], MD. With the following instrument(s): Curette to remove Non-Viable tissue/material. Material  removed includes Slough after achieving pain control using Lidocaine 4% Topical Solution. No specimens were taken. A time out was conducted at 14:40, prior to the start of the procedure. A Minimum amount of bleeding was controlled with Pressure. The procedure was tolerated well. Post Debridement Measurements: 0.4cm length x 0.5cm width x 0.1cm depth; 0.016cm^3 volume. Character of Wound/Ulcer Post Debridement is improved. Post procedure Diagnosis Wound #10: Same as Pre-Procedure Wound #12 Pre-procedure diagnosis of Wound #12 is a Lymphedema located on the Left,Plantar T Third . There was a Selective/Open Wound Non-Viable Tissue oe Debridement with a total area of 0.31 sq cm performed by [name], MD. With the following instrument(s): Curette to remove Non-Viable tissue/material. Material removed includes [facility] after achieving pain control using Lidocaine 4% Topical Solution. No specimens were taken. A time out was conducted at 14:40, prior to the start of the procedure. A Minimum amount of bleeding was  controlled with Pressure. The procedure was tolerated well. Post Debridement Measurements: 0.4cm length x 1cm width x 0.1cm depth; 0.031cm^3 volume. Character of Wound/Ulcer Post Debridement is improved. Post procedure Diagnosis Wound #12: Same as Pre-Procedure Wound #6 Pre-procedure diagnosis of Wound #6 is a Lymphedema located on the Left,Dorsal Foot . There was a Selective/Open Wound Non-Viable Tissue Debridement with a total area of 3.07 sq cm performed by [name], MD. With the following instrument(s): Curette to remove Non-Viable tissue/material. Material removed includes [facility] after achieving pain control using Lidocaine 4% Topical Solution. No specimens were taken. A time out was conducted at 14:40, prior to the start of the procedure. A Minimum amount of bleeding was controlled with Pressure. The procedure was tolerated well. Post Debridement Measurements: 1.7cm length x 2.3cm width x 0.1cm depth; 0.307cm^3 volume. Character of Wound/Ulcer Post Debridement is improved. Post procedure Diagnosis Wound #6: Same as Pre-Procedure Pre-procedure diagnosis of Wound #6 is a Lymphedema located on the Left,Dorsal Foot . There was a Double Layer Compression Therapy Procedure by [name], RN. 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 There was a Selective/Open Wound Non-Viable Tissue Debridement with a total area of 0.38 sq cm performed by [name], MD. With the following instrument(s): Curette to remove Non-Viable tissue/material. Material removed includes [facility] after achieving pain control using Lidocaine 4% Topical Solution. No specimens were taken. A time out was conducted at 14:40, prior to the start of the procedure. A Minimum amount of bleeding was controlled with Pressure. The procedure was tolerated well. Post Debridement Measurements: 0.7cm length x  0.7cm width x 0.2cm depth; 0.077cm^3 volume. Post debridement Stage noted as Category/Stage IV. Character of Wound/Ulcer Post Debridement is improved. 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 There was a Selective/Open Wound Non-Viable Tissue oe Debridement with a total area of 0.16 sq cm performed by [name], MD. With the following instrument(s): Curette to remove Non-Viable tissue/material. Material removed includes [facility] after achieving pain control using Lidocaine 4% Topical Solution. No specimens were taken. A time out was conducted at 14:40, prior to the start of the procedure. A Minimum amount of bleeding was controlled with Pressure. The procedure was tolerated well. Post Debridement Measurements: 0.4cm length x 0.5cm width x 0.1cm depth; 0.016cm^3 volume. Character of Wound/Ulcer Post Debridement is improved. Post procedure Diagnosis Wound #  9: Same as Pre-Procedure Plan Follow-up Appointments: Return Appointment in 1 week. - Dr. Lady Gary - room 2 Other: - call Dr. Lenell Antu to discuss further about your foot. Anesthetic: (In clinic) Topical Lidocaine 4% applied to wound bed - USED in Clinic Bathing/ Shower/ Hygiene: May shower with protection but do not get wound dressing(s) wet. Protect dressing(s) with water repellant cover (for example, large plastic bag) or a cast cover and may then take shower. Edema Control - Lymphedema / SCD / Other: Elevate legs to the level of the heart or above for 30 minutes daily and/or when sitting for 3-4 times a day throughout the day. Avoid standing for long periods of time. Non Wound Condition: Other Non Wound Condition Orders/Instructions: - urgo lite or 3 layer compression wrap on right and left lower leg to be changed by facility The following  medication(s) was prescribed: lidocaine topical 4 % cream cream topical was prescribed at facility WOUND #10: - T Second Wound Laterality: Left oe Cleanser: Soap and Water 1 x Per Week/30 Days Discharge Instructions: May shower and wash wound with dial antibacterial soap and water prior to dressing change. Cleanser: Wound Cleanser 1 x Per Week/30 Days Discharge Instructions: Cleanse the wound with wound cleanser prior to applying a clean dressing using gauze sponges, not tissue or cotton balls. Peri-Wound Care: Sween Lotion (Moisturizing lotion) 1 x Per Week/30 Days Discharge Instructions: Apply moisturizing lotion as directed Topical: Ketoconazole Cream 2% 1 x Per Week/30 Days Discharge Instructions: Apply Ketoconazole as directed Topical: zinc 1 x Per Week/30 Days Prim Dressing: Maxorb Extra Ag+ Alginate Dressing, 2x2 (in/in) 1 x Per Week/30 Days ary Discharge Instructions: Apply to wound bed as instructed Secondary Dressing: Drawtex 4x4 in 1 x Per Week/30 Days Discharge Instructions: Apply over primary dressing as directed. Secondary Dressing: Woven Gauze Sponge, Non-Sterile 4x4 in 1 x Per Week/30 Days Discharge Instructions: can use gauze between toes (if no desired) Secondary Dressing: Zetuvit Plus 4x8 in 1 x Per Week/30 Days Discharge Instructions: Apply over primary dressing as directed. Com pression Wrap: Urgo K2 Lite, (equivalent to a 3 layer) two layer compression system, regular 1 x Per Week/30 Days Discharge Instructions: Apply Urgo K2 Lite as directed (alternative to 3 layer compression). WOUND #12: - T Third Wound Laterality: Plantar, Left oe Cleanser: Soap and Water 1 x Per Week/30 Days Discharge Instructions: May shower and wash wound with dial antibacterial soap and water prior to dressing change. Cleanser: Wound Cleanser 1 x Per Week/30 Days Discharge Instructions: Cleanse the wound with wound cleanser prior to applying a clean dressing using gauze sponges, not tissue or  cotton balls. Peri-Wound Care: Sween Lotion (Moisturizing lotion) 1 x Per Week/30 Days Discharge Instructions: Apply moisturizing lotion as directed Topical: Ketoconazole Cream 2% 1 x Per Week/30 Days Discharge Instructions: Apply Ketoconazole as directed Topical: zinc 1 x Per Week/30 Days Prim Dressing: Maxorb Extra Ag+ Alginate Dressing, 2x2 (in/in) 1 x Per Week/30 Days ary Discharge Instructions: Apply to wound bed as instructed Secondary Dressing: Drawtex 4x4 in 1 x Per Week/30 Days Discharge Instructions: Apply over primary dressing as directed. Secondary Dressing: Woven Gauze Sponge, Non-Sterile 4x4 in 1 x Per Week/30 Days Discharge Instructions: can use gauze between toes (if no desired) Secondary Dressing: Zetuvit Plus 4x8 in 1 x Per Week/30 Days Discharge Instructions: Apply over primary dressing as directed. Com pression Wrap: Urgo K2 Lite, (equivalent to a 3 layer) two layer compression system, regular 1 x Per Week/30 Days Discharge Instructions: Apply Urgo K2 Lite  as directed (alternative to 3 layer compression). WOUND #6: - Foot Wound Laterality: Dorsal, Left Cleanser: Soap and Water 1 x Per Week/30 Days Discharge Instructions: May shower and wash wound with dial antibacterial soap and water prior to dressing change. Cleanser: Wound Cleanser 1 x Per Week/30 Days Discharge Instructions: Cleanse the wound with wound cleanser prior to applying a clean dressing using gauze sponges, not tissue or cotton balls. Peri-Wound Care: Sween Lotion (Moisturizing lotion) 1 x Per Week/30 Days Discharge Instructions: Apply moisturizing lotion as directed Topical: Ketoconazole Cream 2% 1 x Per Week/30 Days Discharge Instructions: Apply Ketoconazole as directed Topical: zinc 1 x Per Week/30 Days Prim Dressing: Maxorb Extra Ag+ Alginate Dressing, 2x2 (in/in) 1 x Per Week/30 Days ary Discharge Instructions: Apply to wound bed as instructed Secondary Dressing: Drawtex 4x4 in 1 x Per Week/30  Days Discharge Instructions: Apply over primary dressing as directed. Secondary Dressing: Woven Gauze Sponge, Non-Sterile 4x4 in 1 x Per Week/30 Days Discharge Instructions: can use gauze between toes (if no desired) Secondary Dressing: Zetuvit Plus 4x8 in 1 x Per Week/30 Days Discharge Instructions: Apply over primary dressing as directed. MADELYN, TLATELPA ([id number]) [id number] Page 16 of 18 Com pression Wrap: Urgo K2 Lite, (equivalent to a 3 layer) two layer compression system, regular 1 x Per Week/30 Days Discharge Instructions: Apply Urgo K2 Lite as directed (alternative to 3 layer compression). WOUND #8: - Foot Wound Laterality: Left, Lateral Cleanser: Soap and Water 1 x Per Week/30 Days Discharge Instructions: May shower and wash wound with dial antibacterial soap and water prior to dressing change. Cleanser: Wound Cleanser 1 x Per Week/30 Days Discharge Instructions: Cleanse the wound with wound cleanser prior to applying a clean dressing using gauze sponges, not tissue or cotton balls. Peri-Wound Care: Sween Lotion (Moisturizing lotion) 1 x Per Week/30 Days Discharge Instructions: Apply moisturizing lotion as directed Topical: Ketoconazole Cream 2% 1 x Per Week/30 Days Discharge Instructions: Apply Ketoconazole as directed Topical: zinc 1 x Per Week/30 Days Prim Dressing: Maxorb Extra Ag+ Alginate Dressing, 2x2 (in/in) 1 x Per Week/30 Days ary Discharge Instructions: Apply to wound bed as instructed Secondary Dressing: Drawtex 4x4 in 1 x Per Week/30 Days Discharge Instructions: Apply over primary dressing as directed. Secondary Dressing: Woven Gauze Sponge, Non-Sterile 4x4 in 1 x Per Week/30 Days Discharge Instructions: can use gauze between toes (if no desired) Secondary Dressing: Zetuvit Plus 4x8 in 1 x Per Week/30 Days Discharge Instructions: Apply over primary dressing as directed. Com pression Wrap: Urgo K2 Lite, (equivalent to a 3 layer) two  layer compression system, regular 1 x Per Week/30 Days Discharge Instructions: Apply Urgo K2 Lite as directed (alternative to 3 layer compression). WOUND #9: - T Great Wound Laterality: Left, Lateral oe Cleanser: Soap and Water 1 x Per Week/30 Days Discharge Instructions: May shower and wash wound with dial antibacterial soap and water prior to dressing change. Cleanser: Wound Cleanser 1 x Per Week/30 Days Discharge Instructions: Cleanse the wound with wound cleanser prior to applying a clean dressing using gauze sponges, not tissue or cotton balls. Peri-Wound Care: Sween Lotion (Moisturizing lotion) 1 x Per Week/30 Days Discharge Instructions: Apply moisturizing lotion as directed Topical: Ketoconazole Cream 2% 1 x Per Week/30 Days Discharge Instructions: Apply Ketoconazole as directed Topical: zinc 1 x Per Week/30 Days Prim Dressing: Maxorb Extra Ag+ Alginate Dressing, 2x2 (in/in) 1 x Per Week/30 Days ary Discharge Instructions: Apply to wound bed as instructed Secondary Dressing: Drawtex 4x4 in 1 x Per Week/30 Days Discharge Instructions:  Apply over primary dressing as directed. Secondary Dressing: Woven Gauze Sponge, Non-Sterile 4x4 in 1 x Per Week/30 Days Discharge Instructions: can use gauze between toes (if no desired) Secondary Dressing: Zetuvit Plus 4x8 in 1 x Per Week/30 Days Discharge Instructions: Apply over primary dressing as directed. Com pression Wrap: Urgo K2 Lite, (equivalent to a 3 layer) two layer compression system, regular 1 x Per Week/30 Days Discharge Instructions: Apply Urgo K2 Lite as directed (alternative to 3 layer compression). 01/30/2023: The wounds are little bit smaller today, but still with substantial slough accumulation and thick drainage. The wound on the plantar surface of the fourth toe has closed but she has opened a new wound on the plantar surface of her third toe. Bone remains exposed on the first and second toe wounds. The lateral fifth toe ulcer is  more superficial. I used a curette to debride slough from all of the wounds. We are not going to use the topical gentamicin and mupirocin this week to see if we can reduce the amount of thick goo that we find each week on her dressings. I am also going to make the contact layer silver alginate for all of the sites, open this will absorb more moisture. We will continue the ketoconazole and zinc oxide, however, as she has a persistent fungal odor present. Continue Urgo light compression. She and her aide are working to get in contact with Dr. Juanetta Gosling office to discuss surgical intervention. In the meantime, she will follow-up in 1 week. Electronic Signature(s) Signed: 01/30/2023 3:38:18 PM By: [name] MD FACS Previous Signature: 01/30/2023 3:34:22 PM Version By: [name] MD FACS Previous Signature: 01/30/2023 3:04:20 PM Version By: [name] MD FACS Entered By: [name] on 01/30/2023 12:38:17 -------------------------------------------------------------------------------- HxROS Details Patient Name: Date of Service: [place] [name], [facility] RO N 01/30/2023 2:15 PM Medical Record Number: [id number] Patient Account Number: [id number] Date of Birth/Sex: Treating RN: [birth year] (82 y.o. 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 Endocrine Medical History: Negative for: Type I Diabetes; Type II Diabetes Past Medical History Notes: hypothyroidism Musculoskeletal Medical History: Positive for: Osteoarthritis Past Medical History Notes: crest  syndrome Neurologic Medical History: Past Medical History Notes: stroke Immunizations Pneumococcal Vaccine: Received Pneumococcal Vaccination: Yes Received Pneumococcal Vaccination On or After 60th Birthday: Yes Implantable Devices None Hospitalization / Surgery History Type of Hospitalization/Surgery cholecystectomy leg surgery tonsillectomy Family and Social History Unknown History: Yes; Former smoker - smoked when she was young; Marital Status - Widowed; Alcohol Use: Never; Drug Use: No History; Caffeine Use: Daily; Financial Concerns: No; Food, Clothing or Shelter Needs: No; Support System Lacking: No; Transportation Concerns: No Electronic Signature(s) Signed: 01/31/2023 7:55:21 AM By: [name] MD FACS Entered By: [name] on 01/30/2023 12:03:01 -------------------------------------------------------------------------------- SuperBill Details Patient Name: Date of Service: [name] [facility] RO N 01/30/2023 Medical Record Number: [id number] Patient Account Number: [id number] Date of Birth/Sex: Treating RN: [birth year] (82 y.o. 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 I10 Essential (primary) hypertension 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 atrial fibrillation Z79.01 Long term (current) use of anticoagulants I89.0  Lymphedema, not elsewhere classified Facility Procedures : CPT4 Code: [id number] Description: 7597 - DEBRIDE WOUND 1ST 20 SQ CM OR < ICD-10 Diagnosis Description 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  witho L97.525 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 wit Modifier: ut evidence of necr hout evidence of ne Quantity: 1 osis crosis Physician Procedures : CPT4 Code Description Modifier [id number] - WC PHYS LEVEL 4 - EST PT 25 ICD-10 Diagnosis Description L97.522 Non-pressure chronic ulcer of other part of left foot with fat layer exposed L97.526 Non-pressure chronic ulcer of other part of left foot  with bone involvement without evidence of necro L97.525 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 without evidence of nec 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 Quantity: 1 sis rosis : [id number] - WC PHYS DEBR WO ANESTH 20 SQ CM ICD-10 Diagnosis Description 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  without evidence of necro L97.525 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

## 2023-01-31 NOTE — Telephone Encounter (Signed)
[name] with [facility] walked into office, filled out triage form, form given to triage RN, caregiver left.  Attempted both numbers on form, no answer for each, no voicemail/voicemail full.

## 2023-02-05 ENCOUNTER — Encounter (HOSPITAL_BASED_OUTPATIENT_CLINIC_OR_DEPARTMENT_OTHER): Payer: Medicare Other | Admitting: General Surgery

## 2023-02-05 DIAGNOSIS — L97522 Non-pressure chronic ulcer of other part of left foot with fat layer exposed: Secondary | ICD-10-CM | POA: Diagnosis not present

## 2023-02-05 NOTE — Progress Notes (Signed)
ANNMARIE, PLEMMONS ([id number]) [id number] Page 1 of 20 Visit Report for 02/05/2023 Chief Complaint Document Details Patient Name: Date of Service: [name] 02/05/2023 2:15 PM Medical Record Number: [id number] Patient Account Number: [id number] Date of Birth/Sex: Treating RN: 08-06-1940 (82 y.o. F) Primary Care Provider: [name], [facility] Other Clinician: Referring Provider: Treating Provider/Extender: [name], [facility] Weeks in Treatment: 62 Information Obtained from: Patient Chief Complaint Patient seen for complaints of Non-Healing Wound. Electronic Signature(s) Signed: 02/05/2023 3:23:38 PM By: [name] MD FACS Entered By: [name] on 02/05/2023 12:23:37 -------------------------------------------------------------------------------- Debridement Details Patient Name: Date of Service: [name], [facility] RO N 02/05/2023 2:15 PM Medical Record Number: [id number] Patient Account Number: [id number] Date of Birth/Sex: Treating RN: [birth year] (82 y.o. [name] Primary Care Provider: [name], [facility] Other Clinician: Referring Provider: Treating Provider/Extender: [name], [facility] Weeks in Treatment: 34 Debridement Performed for Assessment: Wound #10 Left T Second oe Performed By: Physician [name], MD Debridement Type: Debridement Level of Consciousness (Pre-procedure): Awake and Alert Pre-procedure Verification/Time Out Yes - 15:01 Taken: Start Time: 15:01 Pain Control: Lidocaine 4% Topical Solution Percent of Wound Bed Debrided: 100% T Area Debrided (cm): otal 0.12 Tissue and other material debrided: Non-Viable, Eschar, Slough, Slough Level: Non-Viable Tissue Debridement Description: Selective/Open Wound Instrument: Curette Bleeding: Minimum Hemostasis Achieved: Pressure Response to Treatment: Procedure was tolerated well Level of Consciousness (Post- Awake and  Alert procedure): Post Debridement Measurements of Total Wound Length: (cm) 0.3 Width: (cm) 0.5 Depth: (cm) 0.1 Volume: (cm) 0.012 Character of Wound/Ulcer Post Debridement: Improved Post Procedure Diagnosis Same as Pre-procedure [name], [name] ([id number]) [id number] Page 2 of 20 Electronic Signature(s) Signed: 02/05/2023 3:55:41 PM By: [name] Signed: 02/05/2023 4:15:27 PM By: [name] MD FACS Entered By: [name] on 02/05/2023 12:05:56 -------------------------------------------------------------------------------- Debridement Details Patient Name: Date of Service: [name], [facility] RO N 02/05/2023 2:15 PM Medical Record Number: [id number] Patient Account Number: [id number] Date of Birth/Sex: Treating RN: Sep 18, 1940 (82 y.o. [name] Primary Care Provider: [name], [facility] Other Clinician: Referring Provider: Treating Provider/Extender: [name], [facility] Weeks in Treatment: 34 Debridement Performed for Assessment: Wound #12 Left,Plantar T Third oe Performed By: Physician [name], MD The following information was scribed by: [name] The information was scribed for: [name] Debridement Type: Debridement Level of Consciousness (Pre-procedure): Awake and Alert Pre-procedure Verification/Time Out Yes - 15:01 Taken: Start Time: 15:01 Pain Control: Lidocaine 4% Topical Solution Percent of Wound Bed Debrided: 100% T Area Debrided (cm): otal 0.12 Tissue and other material debrided: Non-Viable, Eschar, Slough, Slough Level: Non-Viable Tissue Debridement Description: Selective/Open Wound Instrument: Curette Bleeding: Minimum Hemostasis Achieved: Pressure Response to Treatment: Procedure was tolerated well Level of Consciousness (Post- Awake and Alert procedure): Post Debridement Measurements of Total Wound Length: (cm) 0.5 Width: (cm) 0.3 Depth: (cm) 0.1 Volume:  (cm) 0.012 Character of Wound/Ulcer Post Debridement: Improved Post Procedure Diagnosis Same as Pre-procedure Electronic Signature(s) Signed: 02/05/2023 3:55:41 PM By: [name] Signed: 02/05/2023 4:15:27 PM By: [name] MD FACS Entered By: [name] on 02/05/2023 12:06:04 -------------------------------------------------------------------------------- Debridement Details Patient Name: Date of Service: [name], [facility] RO N 02/05/2023 2:15 PM Medical Record Number: [id number] Patient Account Number: [id number] Date of Birth/Sex: Treating RN: 04-25-1940 (82 y.o. [name] Primary Care Provider: [name], [facility] Other Clinician: Referring Provider: Treating Provider/Extender: [name], [facility] Weeks in Treatment: [address], [place] ([id number]) [id number] Page 3 of 20 Debridement Performed for Assessment: Wound #13 Left  T Fourth oe Performed By: Physician [name], MD The following information was scribed by: [name] The information was scribed for: [name] Debridement Type: Debridement Level of Consciousness (Pre-procedure): Awake and Alert Pre-procedure Verification/Time Out Yes - 15:01 Taken: Start Time: 15:01 Pain Control: Lidocaine 4% Topical Solution Percent of Wound Bed Debrided: 100% T Area Debrided (cm): otal 0.24 Tissue and other material debrided: Non-Viable, Eschar, Slough, Slough Level: Non-Viable Tissue Debridement Description: Selective/Open Wound Instrument: Curette Bleeding: Minimum Hemostasis Achieved: Pressure Response to Treatment: Procedure was tolerated well Level of Consciousness (Post- Awake and Alert procedure): Post Debridement Measurements of Total Wound Length: (cm) 0.3 Width: (cm) 1 Depth: (cm) 0.1 Volume: (cm) 0.024 Character of Wound/Ulcer Post Debridement: Improved Post Procedure Diagnosis Same as Pre-procedure Electronic  Signature(s) Signed: 02/05/2023 3:55:41 PM By: [name] Signed: 02/05/2023 4:15:27 PM By: [name] MD FACS Entered By: [name] on 02/05/2023 12:06:13 -------------------------------------------------------------------------------- Debridement Details Patient Name: Date of Service: [name], [facility] RO N 02/05/2023 2:15 PM Medical Record Number: [id number] Patient Account Number: [id number] Date of Birth/Sex: Treating RN: December 04, 1940 (82 y.o. [name] Primary Care Provider: [name], [facility] Other Clinician: Referring Provider: Treating Provider/Extender: [name], [facility] Weeks in Treatment: 34 Debridement Performed for Assessment: Wound #6 Left,Dorsal Foot Performed By: Physician [name], MD The following information was scribed by: [name] The information was scribed for: [name] Debridement Type: Debridement Level of Consciousness (Pre-procedure): Awake and Alert Pre-procedure Verification/Time Out Yes - 15:01 Taken: Start Time: 15:01 Pain Control: Lidocaine 4% Topical Solution Percent of Wound Bed Debrided: 100% T Area Debrided (cm): otal 5.1 Tissue and other material debrided: Non-Viable, Eschar, Slough, Slough Level: Non-Viable Tissue Debridement Description: Selective/Open Wound Instrument: Curette Bleeding: Minimum Hemostasis Achieved: Pressure Response to Treatment: Procedure was tolerated well Level of Consciousness (Post- Awake and Alert procedure): [name], [name] ([id number]) [id number] Page 4 of 20 Post Debridement Measurements of Total Wound Length: (cm) 2.6 Width: (cm) 2.5 Depth: (cm) 0.1 Volume: (cm) 0.511 Character of Wound/Ulcer Post Debridement: Improved Post Procedure Diagnosis Same as Pre-procedure Electronic Signature(s) Signed: 02/05/2023 3:55:41 PM By: [name] Signed: 02/05/2023 4:15:27 PM By: [name] MD FACS Entered  By: [name] on 02/05/2023 12:06:26 -------------------------------------------------------------------------------- Debridement Details Patient Name: Date of Service: [name], [facility] RO N 02/05/2023 2:15 PM Medical Record Number: [id number] Patient Account Number: [id number] Date of Birth/Sex: Treating RN: [birth year] (82 y.o. [name] Primary Care Provider: [name], [facility] Other Clinician: Referring Provider: Treating Provider/Extender: [name], [facility] Weeks in Treatment: 34 Debridement Performed for Assessment: Wound #8 Left,Lateral Foot Performed By: Physician [name], MD The following information was scribed by: [name] The information was scribed for: [name] Debridement Type: Debridement Level of Consciousness (Pre-procedure): Awake and Alert Pre-procedure Verification/Time Out Yes - 15:01 Taken: Start Time: 15:01 Pain Control: Lidocaine 4% Topical Solution Percent of Wound Bed Debrided: 100% T Area Debrided (cm): otal 0.27 Tissue and other material debrided: Non-Viable, Eschar, Slough, Slough Level: Non-Viable Tissue Debridement Description: Selective/Open Wound Instrument: Curette Bleeding: Minimum Hemostasis Achieved: Pressure Response to Treatment: Procedure was tolerated well Level of Consciousness (Post- Awake and Alert procedure): Post Debridement Measurements of Total Wound Length: (cm) 0.5 Stage: Category/Stage IV Width: (cm) 0.7 Depth: (cm) 0.2 Volume: (cm) 0.055 Character of Wound/Ulcer Post Debridement: Improved Post Procedure Diagnosis Same as Pre-procedure Electronic Signature(s) Signed: 02/05/2023 3:55:41 PM By: [name] Signed: 02/05/2023 4:15:27 PM By: [name] MD FACS Entered By: [name] on 02/05/2023 12:06:43  ANESSIA, [place] ([id number]) [id number] Page 5 of  20 -------------------------------------------------------------------------------- Debridement Details Patient Name: Date of Service: [name] 02/05/2023 2:15 PM Medical Record Number: [id number] Patient Account Number: [id number] Date of Birth/Sex: Treating RN: 04-02-1941 (82 y.o. [name] Primary Care Provider: [name], [facility] Other Clinician: Referring Provider: Treating Provider/Extender: [name], [facility] Weeks in Treatment: 34 Debridement Performed for Assessment: Wound #9 Left,Lateral T Great oe Performed By: Physician [name], MD The following information was scribed by: [name] The information was scribed for: [name] Debridement Type: Debridement Level of Consciousness (Pre-procedure): Awake and Alert Pre-procedure Verification/Time Out Yes - 15:01 Taken: Start Time: 15:01 Pain Control: Lidocaine 4% Topical Solution Percent of Wound Bed Debrided: 100% T Area Debrided (cm): otal 0.02 Tissue and other material debrided: Non-Viable, Eschar, Slough, Slough Level: Non-Viable Tissue Debridement Description: Selective/Open Wound Instrument: Curette Bleeding: Minimum Hemostasis Achieved: Pressure Response to Treatment: Procedure was tolerated well Level of Consciousness (Post- Awake and Alert procedure): Post Debridement Measurements of Total Wound Length: (cm) 0.1 Width: (cm) 0.3 Depth: (cm) 0.1 Volume: (cm) 0.002 Character of Wound/Ulcer Post Debridement: Improved Post Procedure Diagnosis Same as Pre-procedure Electronic Signature(s) Signed: 02/05/2023 3:55:41 PM By: [name] Signed: 02/05/2023 4:15:27 PM By: [name] MD FACS Entered By: [name] on 02/05/2023 12:06:53 -------------------------------------------------------------------------------- HPI Details Patient Name: Date of Service: [name], [facility] RO N 02/05/2023 2:15 PM Medical Record Number: [id number] Patient  Account Number: [id number] Date of Birth/Sex: Treating RN: Jun 20, 1940 (82 y.o. F) Primary Care Provider: [name], [facility] Other Clinician: Referring Provider: Treating Provider/Extender: [name], [facility] Weeks in Treatment: 83 History of Present Illness HPI Description: ADMISSION 06/12/2022 This is an 82 year old woman with a history of CVA, crest syndrome, atrial fibrillation, rocker-bottom foot deformity. 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 She has been followed by podiatry for this and I am not entirely clear as to how she came to be referred here. OZELLA, [name] ([id number]) [id number] Page 6 of 20 She resides in an assisted living facility. It is not clear what they have been putting on her wound, but on intake, she was noted to have denuded skin on her medial third toe, as well as ulcers on her dorsal great toe and lateral great toe. There is slough accumulation on both of the toe ulcers. There was an odor noted at intake, but after her foot was washed, the odor dissipated. Her toes are folded on top of each other creating areas of abrasion and pockets for moisture collection, which seems to be the primary cause of her ulceration. 06/20/2022: The skin between her toes and on the ball of her foot is completely macerated. There has been more tissue breakdown. The wound on her great toe has some slough accumulation. 06/27/2022: No change to her wounds today. There has been no further deterioration, but no significant improvement. She was both hypotensive and bradycardic on intake. 07/11/2022: Today, her foot is completely macerated. She reports that the wound care nurse actually soaked her foot and then applied foam, despite our specific orders to not use foam at all. She also is draining serous fluid from both legs and has 2+ pitting edema. She is on furosemide 20 mg twice a day. 07/19/2022: Once  again, her foot is completely macerated. There has been further tissue breakdown to the second and third toes and she now has ulcers on the distal ends. The wounds on her  medial and lateral great toe have thick slough accumulation. Apparently Xeroform was found between her toes on intake. Edema control is improved, but still not perfect. No overt drainage from her legs appreciated on exam today. 07/27/2022: She has less tissue maceration today. Edema control in her bilateral lower extremities is improved. Still with slough accumulation on all open wound surfaces. 08/08/2022: Significant improvement this week. She has very little tissue maceration and according to her aide, there has been very little drainage from her legs. There is some slough accumulation on the medial great toe ulcer. Edema control is improved bilaterally. She is spending more time in her bed with her legs elevated and less in her wheelchair with her legs in a dependent position. 08/20/2022: The edema in her legs is now well-controlled with the use of the zinc Unna boots. Unfortunately, this seems to have resulted in more drainage coming from the open areas of her feet. They are a bit macerated but there has not been as much tissue breakdown secondary to moisture as we have seen on previous visits. 08/28/2022: The only remaining open wound in her foot is on the dorsal great toe. The improvement in the rest of the foot is quite dramatic, with no tissue maceration or breakdown. She has been elevating her legs and wearing compression wraps. Edema control is excellent and there has been no drainage from her legs. 09/05/2022: Unfortunately, the distal half of her dorsal foot has broken down and has a layer of slough on the surface. This appears to be secondary to moisture accumulation. Her dressing was completely saturated. 09/20/2022: There has been further deterioration of her foot. The dressing that was applied was bizarre and involved  Coflex foam wrapped around her foot to the ankle and then Kerlix and Coban over that to the knee. She fortunately did have silver alginate between her toes but the tissue breakdown from moisture is extensive. 09/25/2022: There has been massive improvement in her foot since last week. The maceration has decreased, edema control is markedly better, and the wounds are showing evidence of healing, as opposed to worsening. 10/03/2022: The wound on her great toe is much smaller with minimal slough accumulation. The dorsal foot is also improving. She has a new ulcer on the plantar surface of her left fourth toe, however, and bone is exposed. Edema control and tissue maceration continues to be significantly better now that we are doing all of the dressing care. 10/09/2022: Unfortunately, it seems that the patient has resumed her habit of sitting in her wheelchair with her legs in a dependent position and there has been a lot more drainage and maceration on her foot. The dorsal foot wounds have expanded and are deeper. She has a new wound on her second toe and although the initial wound on her great toe has healed, she has a new wound on the lateral aspect of her great toe, immediately adjacent to that on her second toe, suggesting these have been caused by friction of the 2 toes rubbing against each other. Her son is participating in this visit via speaker phone. 10/24/2022: Last week she had a nurse visit due to clinic capacity and it appears that her dressing was done differently by the nurse that saw her then how it is usually performed by her regular nurse. Unfortunately, this meant there was more moisture-related tissue breakdown. The dorsum of her foot is open again and she has a new wound on the lateral aspect of her fifth toe. There  is slough accumulation in each of the sites. 11/01/2022: There has been remarkable turnaround since her last visit. All of the wounds are smaller. The wound on the dorsal aspect  of her great toe has closed completely. The wounds on the top of her foot have contracted and are starting to epithelialize. The new wound on the lateral aspect of her fifth toe does have some tendon exposure today that was not appreciated at her last visit. There is more tissue over the exposed bone on the plantar surface of her fourth toe, although bone does still remain exposed. 11/08/2022: All of her wounds are looking better again this week. The exposed bone on the plantar surface of the fourth toe has now been covered with soft tissue. The dorsal foot wounds are epithelializing nicely. The lateral fifth toe wound is smaller. There is slough on all of the surfaces. She had ABIs done in the vascular lab last week. Results are copied here: ABI Findings: +---------+------------------+-----+----------+--------+ Right Rt Pressure (mmHg)IndexWaveform Comment  +---------+------------------+-----+----------+--------+ Brachial 132     +---------+------------------+-----+----------+--------+ PTA 108 0.82 monophasic  +---------+------------------+-----+----------+--------+ DP 81 0.61 monophasic  +---------+------------------+-----+----------+--------+ Great T oe24 0.18 Abnormal   +---------+------------------+-----+----------+--------+ +---------+------------------+-----+----------+--------------------------------+ Left Lt Pressure (mmHg)IndexWaveform Comment  +---------+------------------+-----+----------+--------------------------------+ Brachial 121     +---------+------------------+-----+----------+--------------------------------+ PTA 88 0.67 monophasic  +---------+------------------+-----+----------+--------------------------------+ DP 111 0.84 monophasic  +---------+------------------+-----+----------+--------------------------------+ Great T   unable to obtain due to  oe     bandaging/ wound   +---------+------------------+-----+----------+--------------------------------+ +-------+-----------+-----------+------------+------------+ [name] ([id number]) [phone].pdf Page 7 of 20 ABI/TBIT oday's ABIT oday's TBIPrevious ABIPrevious TBI +-------+-----------+-----------+------------+------------+ Right 0.82 0.18    +-------+-----------+-----------+------------+------------+ Left 0.84     +-------+-----------+-----------+------------+------------+ Summary: Right: Resting right ankle-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 The right toe-brachial index is abnormal. Left: Resting left ankle-brachial index indicates mild left lower extremity arterial disease. Unable to obtain TBI due to bandaging/wound. *See table(s) above for measurements and observations. 11/16/2022: All of her wounds are improving. The wound on the plantar surface of the fourth toe has good tissue covering the bone. The dorsal foot areas are epithelializing. The lateral foot ulcer still has muscle exposed; this is probably the most severe of her wounds at this time. She is scheduled to see vascular surgery on August 20. 11/20/2022: The wound on the plantar surface of the fourth toe seems to be closed. The dorsal foot areas are smaller. The lateral foot ulcer has muscle and tendon exposed. The medial great toe ulcer has bone exposed and the lateral great toe ulcer just has some slough on the surface. Unfortunately, the dorsal second toe ulcer now also has tendon exposure. 12/06/2022: The dorsal foot areas continue to contract. The lateral foot ulcer has some tendon exposure, but there is no longer any necrotic muscle or other nonviable soft tissue. The medial great toe ulcer is about the same with bone exposure. The dorsal second toe ulcer also has bone exposure. She has a new wound on her right anterior tibial surface. The fat layer is  exposed but it is fairly small. It is clean without any slough or eschar accumulation. 12/27/2022: I have not seen this patient in 3 weeks; she had a nurse visit 1 week after the last time I saw her and then has been unable to attend the subsequent visits for various reasons. She did undergo angiography on August 23. 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 There were no options for revascularization. Her wraps were on for 2 weeks and were fairly soupy when they were removed. The plantar fourth toe wound has reopened with bone exposure. The rest of the wounds are all about the same. 01/04/2023: No significant change to any of the wounds. 01/16/2023: Her wounds are all about the same. The dressings were on for almost 2 weeks and were little bit soupy when removed. 01/22/2023: The wounds are basically unchanged, but they were not as soupy as they were last week when her dressings were removed. There is slough accumulation at all of the surfaces. Bone remains exposed on the toe ulcers. There is no real necrosis visible at the lateral foot ulcer. The patient and her aide report that after discussion with the patient's son, the patient feels like she is interested in pursuing a surgical option with Dr. Lenell Antu. 01/30/2023: The wounds are little bit smaller today, but still with substantial slough accumulation and thick drainage. The wound on the plantar surface of the fourth toe has closed but she has opened a new wound on the plantar surface of her third toe. Bone remains exposed on the first and second toe wounds. The lateral fifth toe ulcer is more superficial. 02/05/2023: All of the wounds deteriorated quite a bit this week. There was much more drainage on her dressing today and it had a putrid odor. She admits to not keeping  her leg elevated. Electronic Signature(s) Signed: 02/05/2023 3:24:41 PM By: [name] MD FACS Entered By: [name] on 02/05/2023 12:24:41 -------------------------------------------------------------------------------- Physical Exam Details Patient Name: Date of Service: [name], [facility] RO N 02/05/2023 2:15 PM Medical Record Number: [id number] Patient Account Number: [id number] Date of Birth/Sex: Treating RN: 1940-12-28 (82 y.o. F) Primary Care Provider: [name], [facility] Other Clinician: Referring Provider: Treating Provider/Extender: [name] SHO KES, [facility] Weeks in Treatment: 34 Constitutional . . . . no acute distress. Respiratory Normal work of breathing on room air. Notes 02/05/2023: All of the wounds deteriorated quite a bit this week. There was much more drainage on her dressing today and it had a putrid odor. She admits to not keeping her leg elevated. Electronic Signature(s) [place], [place] ([id number]) [id number] Page 8 of 20 Signed: 02/05/2023 3:25:23 PM By: [name] MD FACS Entered By: [name] on 02/05/2023 12:25:23 -------------------------------------------------------------------------------- Physician Orders Details Patient Name: Date of Service: [name], [facility] RO N 02/05/2023 2:15 PM Medical Record Number: [id number] Patient Account Number: [id number] Date of Birth/Sex: Treating RN: [birth year] (82 y.o. 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 - Dr. Lady Gary - room 2 Return A Anesthetic (In clinic) Topical Lidocaine 4% applied to wound bed - USED in Clinic Bathing/ Shower/ Hygiene May  shower with protection but do not get wound dressing(s) wet. Protect dressing(s) with water repellant cover (for example, large plastic bag) or a cast cover and may then take shower. Edema Control - Lymphedema / SCD / Other Elevate legs to the level of the heart or above for 30 minutes daily and/or when sitting for 3-4 times a day throughout the day. Avoid standing for long periods of time. Non Wound Condition Right Lower Extremity Other Non Wound Condition Orders/Instructions: - urgo lite or 3 layer compression wrap on right and left lower leg to be changed by facility Wound Treatment Wound #10 - T Second oe Wound Laterality: Left Cleanser: Soap and Water 1 x Per Week/30 Days Discharge Instructions: May shower and wash wound with dial antibacterial soap and water prior to dressing change. Cleanser: Wound Cleanser 1 x Per Week/30 Days Discharge Instructions: Cleanse the wound with wound cleanser prior to applying a clean dressing using gauze sponges, not tissue or cotton balls. Peri-Wound Care: Sween Lotion (Moisturizing lotion) 1 x Per Week/30 Days Discharge Instructions: Apply moisturizing lotion as directed Topical: Gentamicin 1 x Per Week/30 Days Discharge Instructions: As directed by physician Topical: Mupirocin Ointment 1 x Per Week/30 Days Discharge  Instructions: Apply Mupirocin (Bactroban) as instructed Topical: Ketoconazole Cream 2% 1 x Per Week/30 Days Discharge Instructions: Apply Ketoconazole as directed MAGNUSSEN, [name] ([id number]) [phone].pdf Page 9 of 20 Topical: zinc 1 x Per Week/30 Days Prim Dressing: Maxorb Extra Ag+ Alginate Dressing, 2x2 (in/in) 1 x Per Week/30 Days ary Discharge Instructions: Apply to wound bed as instructed Secondary Dressing: Drawtex 4x4 in 1 x Per Week/30 Days Discharge Instructions: Apply over primary dressing as directed. Secondary Dressing: Woven Gauze Sponge, Non-Sterile 4x4 in 1 x Per Week/30 Days Discharge Instructions: can use gauze between toes (if no desired) Secondary Dressing: Zetuvit Plus 4x8 in 1 x Per Week/30 Days Discharge Instructions: Apply over primary dressing as directed. Compression Wrap: Urgo K2 Lite, (equivalent to a 3 layer) two layer compression system, regular 1 x Per Week/30 Days Discharge Instructions: Apply Urgo K2 Lite as directed (alternative to 3 layer compression). Wound #12 - T Third oe Wound Laterality: Plantar, Left Cleanser: Soap and Water 1 x Per Week/30 Days Discharge Instructions: May shower and wash wound with dial antibacterial soap and water prior to dressing change. Cleanser: Wound Cleanser 1 x Per Week/30 Days Discharge Instructions: Cleanse the wound with wound cleanser prior to applying a clean dressing using gauze sponges, not tissue or cotton balls. Peri-Wound Care: Sween Lotion (Moisturizing lotion) 1 x Per Week/30 Days Discharge Instructions: Apply moisturizing lotion as directed Topical: Gentamicin 1 x Per Week/30 Days Discharge Instructions: As directed by physician Topical: Mupirocin Ointment 1 x Per Week/30 Days Discharge Instructions: Apply Mupirocin (Bactroban) as instructed Topical: Ketoconazole Cream 2% 1 x Per Week/30 Days Discharge Instructions: Apply Ketoconazole as directed Topical: zinc 1 x Per Week/30  Days Prim Dressing: Maxorb Extra Ag+ Alginate Dressing, 2x2 (in/in) 1 x Per Week/30 Days ary Discharge Instructions: Apply to wound bed as instructed Secondary Dressing: Drawtex 4x4 in 1 x Per Week/30 Days Discharge Instructions: Apply over primary dressing as directed. Secondary Dressing: Woven Gauze Sponge, Non-Sterile 4x4 in 1 x Per Week/30 Days Discharge Instructions: can use gauze between toes (if no desired) Secondary Dressing: Zetuvit Plus 4x8 in 1 x Per Week/30 Days Discharge Instructions: Apply over primary dressing as directed. Compression Wrap: Urgo K2 Lite, (equivalent to a 3 layer) two layer compression system, regular 1 x Per Week/30 Days Discharge Instructions:  Apply Urgo K2 Lite as directed (alternative to 3 layer compression). Wound #13 - T Fourth oe Wound Laterality: Left Cleanser: Soap and Water 1 x Per Week/30 Days Discharge Instructions: May shower and wash wound with dial antibacterial soap and water prior to dressing change. Cleanser: Wound Cleanser 1 x Per Week/30 Days Discharge Instructions: Cleanse the wound with wound cleanser prior to applying a clean dressing using gauze sponges, not tissue or cotton balls. Peri-Wound Care: Sween Lotion (Moisturizing lotion) 1 x Per Week/30 Days Discharge Instructions: Apply moisturizing lotion as directed Topical: Gentamicin 1 x Per Week/30 Days Discharge Instructions: As directed by physician Topical: Mupirocin Ointment 1 x Per Week/30 Days Discharge Instructions: Apply Mupirocin (Bactroban) as instructed Topical: Ketoconazole Cream 2% 1 x Per Week/30 Days Discharge Instructions: Apply Ketoconazole as directed Topical: zinc 1 x Per Week/30 Days Prim Dressing: Maxorb Extra Ag+ Alginate Dressing, 2x2 (in/in) 1 x Per Week/30 Days ary Discharge Instructions: Apply to wound bed as instructed [name], [name] ([id number]) [id number] Page 10 of 20 Secondary Dressing: Drawtex 4x4 in 1 x Per Week/30  Days Discharge Instructions: Apply over primary dressing as directed. Secondary Dressing: Woven Gauze Sponge, Non-Sterile 4x4 in 1 x Per Week/30 Days Discharge Instructions: can use gauze between toes (if no desired) Secondary Dressing: Zetuvit Plus 4x8 in 1 x Per Week/30 Days Discharge Instructions: Apply over primary dressing as directed. Compression Wrap: Urgo K2 Lite, (equivalent to a 3 layer) two layer compression system, regular 1 x Per Week/30 Days Discharge Instructions: Apply Urgo K2 Lite as directed (alternative to 3 layer compression). Wound #6 - Foot Wound Laterality: Dorsal, Left Cleanser: Soap and Water 1 x Per Week/30 Days Discharge Instructions: May shower and wash wound with dial antibacterial soap and water prior to dressing change. Cleanser: Wound Cleanser 1 x Per Week/30 Days Discharge Instructions: Cleanse the wound with wound cleanser prior to applying a clean dressing using gauze sponges, not tissue or cotton balls. Peri-Wound Care: Sween Lotion (Moisturizing lotion) 1 x Per Week/30 Days Discharge Instructions: Apply moisturizing lotion as directed Topical: Gentamicin 1 x Per Week/30 Days Discharge Instructions: As directed by physician Topical: Mupirocin Ointment 1 x Per Week/30 Days Discharge Instructions: Apply Mupirocin (Bactroban) as instructed Topical: Ketoconazole Cream 2% 1 x Per Week/30 Days Discharge Instructions: Apply Ketoconazole as directed Topical: zinc 1 x Per Week/30 Days Prim Dressing: Maxorb Extra Ag+ Alginate Dressing, 2x2 (in/in) 1 x Per Week/30 Days ary Discharge Instructions: Apply to wound bed as instructed Secondary Dressing: Drawtex 4x4 in 1 x Per Week/30 Days Discharge Instructions: Apply over primary dressing as directed. Secondary Dressing: Woven Gauze Sponge, Non-Sterile 4x4 in 1 x Per Week/30 Days Discharge Instructions: can use gauze between toes (if no desired) Secondary Dressing: Zetuvit Plus 4x8 in 1 x Per Week/30 Days Discharge  Instructions: Apply over primary dressing as directed. Compression Wrap: Urgo K2 Lite, (equivalent to a 3 layer) two layer compression system, regular 1 x Per Week/30 Days Discharge Instructions: Apply Urgo K2 Lite as directed (alternative to 3 layer compression). Wound #8 - Foot Wound Laterality: Left, Lateral Cleanser: Soap and Water 1 x Per Week/30 Days Discharge Instructions: May shower and wash wound with dial antibacterial soap and water prior to dressing change. Cleanser: Wound Cleanser 1 x Per Week/30 Days Discharge Instructions: Cleanse the wound with wound cleanser prior to applying a clean dressing using gauze sponges, not tissue or cotton balls. Peri-Wound Care: Sween Lotion (Moisturizing lotion) 1 x Per Week/30 Days Discharge Instructions: Apply moisturizing lotion as directed  Topical: Gentamicin 1 x Per Week/30 Days Discharge Instructions: As directed by physician Topical: Mupirocin Ointment 1 x Per Week/30 Days Discharge Instructions: Apply Mupirocin (Bactroban) as instructed Topical: Ketoconazole Cream 2% 1 x Per Week/30 Days Discharge Instructions: Apply Ketoconazole as directed Topical: zinc 1 x Per Week/30 Days Prim Dressing: Maxorb Extra Ag+ Alginate Dressing, 2x2 (in/in) 1 x Per Week/30 Days ary Discharge Instructions: Apply to wound bed as instructed Secondary Dressing: Drawtex 4x4 in 1 x Per Week/30 Days Discharge Instructions: Apply over primary dressing as directed. Secondary Dressing: Woven Gauze Sponge, Non-Sterile 4x4 in 1 x Per Week/30 Days [place], [name] ([id number]) [phone].pdf Page 11 of 20 Discharge Instructions: can use gauze between toes (if no desired) Secondary Dressing: Zetuvit Plus 4x8 in 1 x Per Week/30 Days Discharge Instructions: Apply over primary dressing as directed. Compression Wrap: Urgo K2 Lite, (equivalent to a 3 layer) two layer compression system, regular 1 x Per Week/30 Days Discharge Instructions: Apply Urgo  K2 Lite as directed (alternative to 3 layer compression). Wound #9 - T Great oe Wound Laterality: Left, Lateral Cleanser: Soap and Water 1 x Per Week/30 Days Discharge Instructions: May shower and wash wound with dial antibacterial soap and water prior to dressing change. Cleanser: Wound Cleanser 1 x Per Week/30 Days Discharge Instructions: Cleanse the wound with wound cleanser prior to applying a clean dressing using gauze sponges, not tissue or cotton balls. Peri-Wound Care: Sween Lotion (Moisturizing lotion) 1 x Per Week/30 Days Discharge Instructions: Apply moisturizing lotion as directed Topical: Gentamicin 1 x Per Week/30 Days Discharge Instructions: As directed by physician Topical: Mupirocin Ointment 1 x Per Week/30 Days Discharge Instructions: Apply Mupirocin (Bactroban) as instructed Topical: Ketoconazole Cream 2% 1 x Per Week/30 Days Discharge Instructions: Apply Ketoconazole as directed Topical: zinc 1 x Per Week/30 Days Prim Dressing: Maxorb Extra Ag+ Alginate Dressing, 2x2 (in/in) 1 x Per Week/30 Days ary Discharge Instructions: Apply to wound bed as instructed Secondary Dressing: Drawtex 4x4 in 1 x Per Week/30 Days Discharge Instructions: Apply over primary dressing as directed. Secondary Dressing: Woven Gauze Sponge, Non-Sterile 4x4 in 1 x Per Week/30 Days Discharge Instructions: can use gauze between toes (if no desired) Secondary Dressing: Zetuvit Plus 4x8 in 1 x Per Week/30 Days Discharge Instructions: Apply over primary dressing as directed. Compression Wrap: Urgo K2 Lite, (equivalent to a 3 layer) two layer compression system, regular 1 x Per Week/30 Days Discharge Instructions: Apply Urgo K2 Lite as directed (alternative to 3 layer compression). Patient Medications llergies: Sulfa (Sulfonamide Antibiotics) A Notifications Medication Indication Start End 02/05/2023 lidocaine DOSE topical 4 % cream - cream topical Electronic Signature(s) Signed: 02/05/2023  4:15:27 PM By: [name] MD FACS Entered By: [name] on 02/05/2023 12:25:53 -------------------------------------------------------------------------------- Problem List Details Patient Name: Date of Service: [name], [facility] RO N 02/05/2023 2:15 PM Medical Record Number: [id number] Patient Account Number: [id number] Date of Birth/Sex: Treating RN: 05-02-1940 (82 y.o. 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 10/03/2022 No Yes without evidence of necrosis L97.525 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 11/01/2022 No Yes without evidence of necrosis 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 06/12/2022  No Yes I10 Essential (primary) hypertension 06/12/2022 No Yes 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 06/12/2022 No Yes 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 atrial fibrillation 06/12/2022 No Yes Z79.01 Long term (current) use of anticoagulants 06/12/2022 No Yes I89.0 Lymphedema, not elsewhere classified 08/20/2022 No Yes Inactive Problems ICD-10 Code Description Active Date Inactive Date L97.521 Non-pressure chronic ulcer of other part of left foot limited to breakdown of skin 06/12/2022 06/12/2022 Resolved Problems ICD-10 Code Description Active Date Resolved Date L97.812 Non-pressure chronic ulcer of other part of right lower leg with fat layer exposed 12/06/2022 12/06/2022 Electronic Signature(s) Signed: 02/05/2023 3:23:06 PM By: [name] MD FACS Entered By: [name] on 02/05/2023 12:23:06 -------------------------------------------------------------------------------- Progress Note Details Patient Name: Date of Service: [name], [facility] RO N 02/05/2023 2:15 PM [place], [name] ([id number]) [id number] Page 13 of 20 Medical Record Number: [id number] Patient Account Number: [id number] Date of Birth/Sex: Treating RN: [birth year] (82 y.o. F) Primary Care Provider: [name], [facility] Other Clinician: Referring Provider: Treating Provider/Extender: [name], [facility] Weeks in Treatment: 55 Subjective Chief Complaint Information obtained from Patient Patient seen for complaints of Non-Healing Wound. History of Present Illness (HPI) ADMISSION 06/12/2022 This is an 82 year old woman with a history of CVA, crest syndrome, atrial fibrillation, rocker-bottom foot deformity. 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 She has been followed by podiatry for this and I am not entirely clear as to how she came to be referred here. She resides in an assisted living facility. It is not clear what they have been putting on her wound, but on intake, she was noted to have denuded skin on her medial third toe, as well as ulcers on her dorsal great toe and lateral great toe. There is slough accumulation on both of the toe ulcers. There was an odor noted at intake, but after her foot was washed, the odor dissipated. Her toes are folded on top of each other creating areas of abrasion and pockets for moisture collection, which seems to be the primary cause of her ulceration. 06/20/2022: The skin between her toes and on the ball of her foot is completely macerated. There has been more tissue breakdown. The wound on her great toe has some slough accumulation. 06/27/2022: No change to her wounds today. There has been no further deterioration, but no significant improvement. She was both hypotensive  and bradycardic on intake. 07/11/2022: Today, her foot is completely macerated. She reports that the wound care nurse actually soaked her foot and then applied foam, despite our specific orders to not use foam at all. She also is draining serous fluid from both legs and has 2+ pitting edema. She is on furosemide 20 mg twice a day. 07/19/2022: Once again, her foot is completely macerated. There has been further tissue breakdown to the second and third toes and she now has ulcers on the distal ends. The wounds on her medial and lateral great toe have thick slough accumulation. Apparently Xeroform was found between her toes on intake. Edema control is improved, but still not perfect. No overt drainage from her legs appreciated on exam today. 07/27/2022: She has less tissue maceration today. Edema control in her bilateral lower extremities is improved. Still with slough accumulation on all open wound surfaces. 08/08/2022: Significant improvement this week. She has very little tissue maceration and according to her aide, there has been very little drainage from her legs. There is some slough accumulation on the medial  great toe ulcer. Edema control is improved bilaterally. She is spending more time in her bed with her legs elevated and less in her wheelchair with her legs in a dependent position. 08/20/2022: The edema in her legs is now well-controlled with the use of the zinc Unna boots. Unfortunately, this seems to have resulted in more drainage coming from the open areas of her feet. They are a bit macerated but there has not been as much tissue breakdown secondary to moisture as we have seen on previous visits. 08/28/2022: The only remaining open wound in her foot is on the dorsal great toe. The improvement in the rest of the foot is quite dramatic, with no tissue maceration or breakdown. She has been elevating her legs and wearing compression wraps. Edema control is excellent and there has been no drainage  from her legs. 09/05/2022: Unfortunately, the distal half of her dorsal foot has broken down and has a layer of slough on the surface. This appears to be secondary to moisture accumulation. Her dressing was completely saturated. 09/20/2022: There has been further deterioration of her foot. The dressing that was applied was bizarre and involved Coflex foam wrapped around her foot to the ankle and then Kerlix and Coban over that to the knee. She fortunately did have silver alginate between her toes but the tissue breakdown from moisture is extensive. 09/25/2022: There has been massive improvement in her foot since last week. The maceration has decreased, edema control is markedly better, and the wounds are showing evidence of healing, as opposed to worsening. 10/03/2022: The wound on her great toe is much smaller with minimal slough accumulation. The dorsal foot is also improving. She has a new ulcer on the plantar surface of her left fourth toe, however, and bone is exposed. Edema control and tissue maceration continues to be significantly better now that we are doing all of the dressing care. 10/09/2022: Unfortunately, it seems that the patient has resumed her habit of sitting in her wheelchair with her legs in a dependent position and there has been a lot more drainage and maceration on her foot. The dorsal foot wounds have expanded and are deeper. She has a new wound on her second toe and although the initial wound on her great toe has healed, she has a new wound on the lateral aspect of her great toe, immediately adjacent to that on her second toe, suggesting these have been caused by friction of the 2 toes rubbing against each other. Her son is participating in this visit via speaker phone. 10/24/2022: Last week she had a nurse visit due to clinic capacity and it appears that her dressing was done differently by the nurse that saw her then how it is usually performed by her regular nurse. Unfortunately,  this meant there was more moisture-related tissue breakdown. The dorsum of her foot is open again and she has a new wound on the lateral aspect of her fifth toe. There is slough accumulation in each of the sites. 11/01/2022: There has been remarkable turnaround since her last visit. All of the wounds are smaller. The wound on the dorsal aspect of her great toe has closed completely. The wounds on the top of her foot have contracted and are starting to epithelialize. The new wound on the lateral aspect of her fifth toe does have some tendon exposure today that was not appreciated at her last visit. There is more tissue over the exposed bone on the plantar surface of her fourth toe,  although bone does still remain exposed. 11/08/2022: All of her wounds are looking better again this week. The exposed bone on the plantar surface of the fourth toe has now been covered with soft tissue. The dorsal foot wounds are epithelializing nicely. The lateral fifth toe wound is smaller. There is slough on all of the surfaces. She had ABIs done in the vascular lab last week. Results are copied here: ABI Findings: +---------+------------------+-----+----------+--------+ Right Rt Pressure (mmHg)IndexWaveform Comment  +---------+------------------+-----+----------+--------+ Brachial 132     +---------+------------------+-----+----------+--------+ PTA 108 0.82 monophasic  [place], [place] ([id number]) [phone].pdf Page 14 of 20 +---------+------------------+-----+----------+--------+ DP 81 0.61 monophasic  +---------+------------------+-----+----------+--------+ Great T oe24 0.18 Abnormal   +---------+------------------+-----+----------+--------+ +---------+------------------+-----+----------+--------------------------------+ Left Lt Pressure (mmHg)IndexWaveform Comment   +---------+------------------+-----+----------+--------------------------------+ Brachial 121     +---------+------------------+-----+----------+--------------------------------+ PTA 88 0.67 monophasic  +---------+------------------+-----+----------+--------------------------------+ DP 111 0.84 monophasic  +---------+------------------+-----+----------+--------------------------------+ Great T   unable to obtain due to  oe     bandaging/ wound  +---------+------------------+-----+----------+--------------------------------+ +-------+-----------+-----------+------------+------------+ ABI/TBIT oday's ABIT oday's TBIPrevious ABIPrevious TBI +-------+-----------+-----------+------------+------------+ Right 0.82 0.18    +-------+-----------+-----------+------------+------------+ Left 0.84     +-------+-----------+-----------+------------+------------+ Summary: Right: Resting right ankle-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 The right toe-brachial index is abnormal. Left: Resting left ankle-brachial index indicates mild left lower extremity arterial disease. Unable to obtain TBI due to bandaging/wound. *See table(s) above for measurements and observations. 11/16/2022: All of her wounds are improving. The wound on the plantar surface of the fourth toe has good tissue covering the bone. The dorsal foot areas are epithelializing. The lateral foot ulcer still has muscle exposed; this is probably the most severe of her wounds at this time. She is scheduled to see vascular surgery on August 20. 11/20/2022: The wound on the plantar surface of the fourth toe seems to be closed. The dorsal foot areas are smaller. The lateral foot ulcer has muscle and tendon exposed. The medial great toe ulcer has bone exposed and the lateral great toe ulcer just has some slough on the surface. Unfortunately, the dorsal second toe ulcer now  also has tendon exposure. 12/06/2022: The dorsal foot areas continue to contract. The lateral foot ulcer has some tendon exposure, but there is no longer any necrotic muscle or other nonviable soft tissue. The medial great toe ulcer is about the same with bone exposure. The dorsal second toe ulcer also has bone exposure. She has a new wound on her right anterior tibial surface. The fat layer is exposed but it is fairly small. It is clean without any slough or eschar accumulation. 12/27/2022: I have not seen this patient in 3 weeks; she had a nurse visit 1 week after the last time I saw her and then has been unable to attend the subsequent visits for various reasons. She did undergo angiography on August 23. 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 There were no options for revascularization. Her wraps were on for 2 weeks and were fairly soupy when they were removed. The plantar fourth toe wound has reopened with bone exposure. The rest of the wounds are all about the same. 01/04/2023: No significant change to any of the wounds. 01/16/2023: Her wounds are all about the same. The dressings were on for almost 2 weeks and were little bit soupy when removed. 01/22/2023: The wounds are basically unchanged, but they were not as soupy as they were last week  when her dressings were removed. There is slough accumulation at all of the surfaces. Bone remains exposed on the toe ulcers. There is no real necrosis visible at the lateral foot ulcer. The patient and her aide report that after discussion with the patient's son, the patient feels like she is interested in pursuing a surgical option with Dr. Lenell Antu. 01/30/2023: The wounds are little bit smaller today, but still with substantial slough accumulation and thick drainage. The wound on  the plantar surface of the fourth toe has closed but she has opened a new wound on the plantar surface of her third toe. Bone remains exposed on the first and second toe wounds. The lateral fifth toe ulcer is more superficial. 02/05/2023: All of the wounds deteriorated quite a bit this week. There was much more drainage on her dressing today and it had a putrid odor. She admits to not keeping her leg elevated. Patient History Information obtained from Patient. Family History Unknown History. Social History Former smoker - smoked when she was young, Marital Status - Widowed, Alcohol Use - Never, Drug Use - No History, Caffeine Use - Daily. Medical History Hematologic/Lymphatic Patient has history of Anemia Cardiovascular Patient has history of Angina - a-fib, Hypertension, Vasculitis Endocrine Denies history of Type I Diabetes, Type II Diabetes Musculoskeletal Patient has history of Osteoarthritis Hospitalization/Surgery History - cholecystectomy. - leg surgery. - tonsillectomy. 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 Vitals Time Taken: 2:27 PM, Height: 67 in, Weight: 153 lbs, BMI: 24, Temperature: 98.1 F, Pulse: 99 bpm, Respiratory Rate: 16 breaths/min, Blood Pressure: 117/72 mmHg. Respiratory Normal work of breathing on room air. General Notes: 02/05/2023: All of the wounds deteriorated quite a bit this week. There was much more drainage on her dressing today and it had a putrid odor. She admits to not keeping her leg elevated. Integumentary (Hair, Skin) Wound #10 status is Open. Original cause of wound was Gradually Appeared. The date acquired was: 09/05/2022. The wound has been in treatment 8 weeks. The wound is located on the Left T Second.  The wound measures 0.3cm length x 0.5cm width x 0.1cm depth; 0.118cm^2 area and 0.012cm^3 volume. There oe is Fat Layer (Subcutaneous Tissue) exposed. There is no tunneling or undermining noted. There is a medium amount of serosanguineous drainage noted. The wound margin is distinct with the outline attached to the wound base. There is small (1-33%) pink granulation within the wound bed. There is a large (67-100%) amount of necrotic tissue within the wound bed including Adherent Slough. The periwound skin appearance had no abnormalities noted for texture. The periwound skin appearance had no abnormalities noted for moisture. The periwound skin appearance had no abnormalities noted for color. Periwound temperature was noted as No Abnormality. The periwound has tenderness on palpation. Wound #12 status is Open. Original cause of wound was Gradually Appeared. The date acquired was: 01/30/2023. The wound is located on the Left,Plantar T oe Third. The wound measures 0.5cm length x 0.3cm width x 0.1cm depth; 0.118cm^2 area and 0.012cm^3 volume. There is Fat Layer (Subcutaneous Tissue) exposed. There is no tunneling or undermining noted. There is a medium amount of serosanguineous drainage noted. The wound margin is distinct with the outline attached to the wound base. There is small (1-33%) red granulation within the wound bed. There is a large (67-100%) amount of necrotic tissue within the wound bed  including Adherent Slough. The periwound skin appearance had no abnormalities noted for color. The periwound skin appearance exhibited: Excoriation, Maceration. Periwound temperature was noted as No Abnormality. The periwound has tenderness on palpation. Wound #13 status is Open. Original cause of wound was Gradually Appeared. The date acquired was: 02/05/2023. The wound is located on the Left T Fourth. oe The wound measures 0.3cm length x 1cm width x 0.1cm depth; 0.236cm^2 area and 0.024cm^3 volume. There is  bone and Fat Layer (Subcutaneous Tissue) exposed. There is no tunneling or undermining noted. There is a medium amount of serosanguineous drainage noted. The wound margin is distinct with the outline attached to the wound base. There is small (1-33%) pink granulation within the wound bed. There is a large (67-100%) amount of necrotic tissue within the wound bed including Adherent Slough. The periwound skin appearance had no abnormalities noted for texture. The periwound skin appearance had no abnormalities noted for color. The periwound skin appearance exhibited: Maceration. Periwound temperature was noted as No Abnormality. The periwound has tenderness on palpation. Wound #6 status is Open. Original cause of wound was Gradually Appeared. The date acquired was: 09/05/2022. The wound has been in treatment 21 weeks. The wound is located on the Left,Dorsal Foot. The wound measures 2.6cm length x 2.5cm width x 0.1cm depth; 5.105cm^2 area and 0.511cm^3 volume. There is Fat Layer (Subcutaneous Tissue) exposed. There is no tunneling or undermining noted. There is a medium amount of serosanguineous drainage noted. The wound margin is indistinct and nonvisible. There is small (1-33%) pink granulation within the wound bed. There is a large (67-100%) amount of necrotic tissue within the wound bed including Adherent Slough. The periwound skin appearance had no abnormalities noted for texture. The periwound skin appearance had no abnormalities noted for moisture. The periwound skin appearance exhibited: Rubor. Periwound temperature was noted as No Abnormality. The periwound has tenderness on palpation. Wound #8 status is Open. Original cause of wound was Pressure Injury. The date acquired was: 10/24/2022. The wound has been in treatment 14 weeks. The wound is located on the Left,Lateral Foot. The wound measures 0.5cm length x 0.7cm width x 0.2cm depth; 0.275cm^2 area and 0.055cm^3 volume. There is Fat Layer  (Subcutaneous Tissue) exposed. There is no tunneling or undermining noted. There is a medium amount of serosanguineous drainage noted. The wound margin is distinct with the outline attached to the wound base. There is no granulation within the wound bed. There is a large (67-100%) amount of necrotic tissue within the wound bed including Adherent Slough. The periwound skin appearance had no abnormalities noted for texture. The periwound skin appearance had no abnormalities noted for moisture. The periwound skin appearance had no abnormalities noted for color. Periwound temperature was noted as No Abnormality. The periwound has tenderness on palpation. Wound #9 status is Open. Original cause of wound was Gradually Appeared. The date acquired was: 11/01/2022. The wound has been in treatment 13 weeks. The wound is located on the [employer]. The wound measures 0.1cm length x 0.3cm width x 0.1cm depth; 0.024cm^2 area and 0.002cm^3 volume. There oe is bone and Fat Layer (Subcutaneous Tissue) exposed. There is no tunneling or undermining noted. There is a none present amount of drainage noted. The wound margin is flat and intact. There is no granulation within the wound bed. There is a large (67-100%) amount of necrotic tissue within the wound bed including Eschar. The periwound skin appearance had no abnormalities noted for texture. The periwound skin appearance had no abnormalities  noted for moisture. The periwound skin appearance had no abnormalities noted for color. Periwound temperature was noted as No Abnormality. The periwound has tenderness on palpation. 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 There was a Selective/Open Wound Non-Viable Tissue Debridement oe with a total area of 0.12 sq cm performed by [name], MD. With the following instrument(s): Curette to remove Non-Viable tissue/material. Material removed includes Eschar and Slough and after achieving pain control using Lidocaine 4% T opical Solution. No specimens were taken. A time out was conducted at 15:01, prior to the start of the procedure. A Minimum amount of bleeding was controlled with Pressure. The procedure was tolerated well. Post Debridement Measurements: 0.3cm length x 0.5cm width x 0.1cm depth; 0.012cm^3 volume. Character of Wound/Ulcer Post Debridement is improved. Post procedure Diagnosis Wound #10: Same as Pre-Procedure Wound #12 Pre-procedure diagnosis of Wound #12 is a Lymphedema located on the Left,Plantar T Third . There was a Selective/Open Wound Non-Viable Tissue oe Debridement with a total area of 0.12 sq cm performed by [name], MD. With the following instrument(s): Curette to remove Non-Viable tissue/material. Material removed includes Eschar and Slough and after achieving pain control using Lidocaine 4% Topical Solution. No specimens were taken. A time out was conducted at 15:01, prior to the start of the procedure. A Minimum amount of bleeding was controlled with Pressure. The procedure was tolerated well. Post Debridement Measurements: 0.5cm length x 0.3cm width x 0.1cm depth; 0.012cm^3 volume. Character of Wound/Ulcer Post Debridement is improved. Post procedure Diagnosis Wound  #12: Same as Pre-Procedure Wound #13 Pre-procedure diagnosis of Wound #13 is an Atypical located on the Left T Fourth . There was a Selective/Open Wound Non-Viable Tissue Debridement with oe a total area of 0.24 sq cm performed by [name], MD. With the following instrument(s): Curette to remove Non-Viable tissue/material. Material removed includes Eschar and Slough and after achieving pain control using Lidocaine 4% T opical Solution. No specimens were taken. A time out was conducted at 15:01, prior to the start of the procedure. A Minimum amount of bleeding was controlled with Pressure. The procedure was tolerated well. Post Debridement Measurements: 0.3cm length x 1cm width x 0.1cm depth; 0.024cm^3 volume. Character of Wound/Ulcer Post Debridement is improved. Post procedure Diagnosis Wound #13: Same as Pre-Procedure Wound #6 Pre-procedure diagnosis of Wound #6 is a Lymphedema located on the Left,Dorsal Foot . There was a Selective/Open Wound Non-Viable Tissue Debridement with a total area of 5.1 sq cm performed by [name], MD. With the following instrument(s): Curette to remove Non-Viable tissue/material. Material removed includes Eschar and Slough and after achieving pain control using Lidocaine 4% T opical Solution. No specimens were taken. A time out was conducted at 15:01, prior to the start of the procedure. A Minimum amount of bleeding was controlled with Pressure. The procedure was tolerated well. Post Debridement Measurements: 2.6cm length x 2.5cm width x 0.1cm depth; 0.511cm^3 volume. Character of Wound/Ulcer  Post Debridement is improved. Post procedure Diagnosis Wound #6: Same as Pre-Procedure Pre-procedure diagnosis of Wound #6 is a Lymphedema located on the Left,Dorsal Foot . There was a Double Layer Compression Therapy Procedure by [name], RN. Post procedure Diagnosis Wound #6: Same as Pre-Procedure Wound #8 Pre-procedure diagnosis of Wound #8 is a  Pressure Ulcer located on the Left,Lateral Foot . There was a Selective/Open Wound Non-Viable Tissue Debridement with a total area of 0.27 sq cm performed by [name], MD. With the following instrument(s): Curette to remove Non-Viable tissue/material. Material removed includes Eschar and Slough and after achieving pain control using Lidocaine 4% T opical Solution. No specimens were taken. A time out was conducted at 15:01, prior to the start of the procedure. A Minimum amount of bleeding was controlled with Pressure. The procedure was tolerated well. Post Debridement Measurements: 0.5cm length x 0.7cm width x 0.2cm depth; 0.055cm^3 volume. Post debridement Stage noted as Category/Stage IV. Character of Wound/Ulcer Post Debridement is improved. 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 There was a Selective/Open Wound Non-Viable Tissue oe Debridement with a total area of 0.02 sq cm performed by [name], MD. With the following instrument(s): Curette to remove Non-Viable tissue/material. Material removed includes Eschar and Slough and after achieving pain control using Lidocaine 4% Topical Solution. No specimens were taken. A time out was conducted at 15:01, prior to the start of the procedure. A Minimum amount of bleeding was controlled with Pressure. The procedure was tolerated well. Post Debridement Measurements: 0.1cm length x 0.3cm width x 0.1cm depth; 0.002cm^3 volume. Character of Wound/Ulcer Post Debridement is improved. Post procedure Diagnosis Wound #9: Same as Pre-Procedure Plan Emmerich, Glada ([id number]) [phone].pdf Page 17 of 20 Follow-up Appointments: Return Appointment in 1 week. - Dr. Lady Gary - room 2 Anesthetic: (In clinic) Topical Lidocaine 4% applied to wound bed - USED in Clinic Bathing/ Shower/ Hygiene: May shower  with protection but do not get wound dressing(s) wet. Protect dressing(s) with water repellant cover (for example, large plastic bag) or a cast cover and may then take shower. Edema Control - Lymphedema / SCD / Other: Elevate legs to the level of the heart or above for 30 minutes daily and/or when sitting for 3-4 times a day throughout the day. Avoid standing for long periods of time. Non Wound Condition: Other Non Wound Condition Orders/Instructions: - urgo lite or 3 layer compression wrap on right and left lower leg to be changed by facility The following medication(s) was prescribed: lidocaine topical 4 % cream cream topical was prescribed at facility WOUND #10: - T Second Wound Laterality: Left oe Cleanser: Soap and Water 1 x Per Week/30 Days Discharge Instructions: May shower and wash wound with dial antibacterial soap and water prior to dressing change. Cleanser: Wound Cleanser 1 x Per Week/30 Days Discharge Instructions: Cleanse the wound with wound cleanser prior to applying a clean dressing using gauze sponges, not tissue or cotton balls. Peri-Wound Care: Sween Lotion (Moisturizing lotion) 1 x Per Week/30 Days Discharge Instructions: Apply moisturizing lotion as directed Topical: Gentamicin 1 x Per Week/30 Days Discharge Instructions: As directed by physician Topical: Mupirocin Ointment 1 x Per Week/30 Days Discharge Instructions: Apply Mupirocin (Bactroban) as instructed Topical: Ketoconazole Cream 2% 1 x Per Week/30 Days Discharge Instructions: Apply Ketoconazole as directed Topical: zinc 1 x Per Week/30 Days Prim Dressing: Maxorb Extra Ag+ Alginate Dressing, 2x2 (in/in) 1 x  Per Week/30 Days ary Discharge Instructions: Apply to wound bed as instructed Secondary Dressing: Drawtex 4x4 in 1 x Per Week/30 Days Discharge Instructions: Apply over primary dressing as directed. Secondary Dressing: Woven Gauze Sponge, Non-Sterile 4x4 in 1 x Per Week/30 Days Discharge Instructions: can  use gauze between toes (if no desired) Secondary Dressing: Zetuvit Plus 4x8 in 1 x Per Week/30 Days Discharge Instructions: Apply over primary dressing as directed. Com pression Wrap: Urgo K2 Lite, (equivalent to a 3 layer) two layer compression system, regular 1 x Per Week/30 Days Discharge Instructions: Apply Urgo K2 Lite as directed (alternative to 3 layer compression). WOUND #12: - T Third Wound Laterality: Plantar, Left oe Cleanser: Soap and Water 1 x Per Week/30 Days Discharge Instructions: May shower and wash wound with dial antibacterial soap and water prior to dressing change. Cleanser: Wound Cleanser 1 x Per Week/30 Days Discharge Instructions: Cleanse the wound with wound cleanser prior to applying a clean dressing using gauze sponges, not tissue or cotton balls. Peri-Wound Care: Sween Lotion (Moisturizing lotion) 1 x Per Week/30 Days Discharge Instructions: Apply moisturizing lotion as directed Topical: Gentamicin 1 x Per Week/30 Days Discharge Instructions: As directed by physician Topical: Mupirocin Ointment 1 x Per Week/30 Days Discharge Instructions: Apply Mupirocin (Bactroban) as instructed Topical: Ketoconazole Cream 2% 1 x Per Week/30 Days Discharge Instructions: Apply Ketoconazole as directed Topical: zinc 1 x Per Week/30 Days Prim Dressing: Maxorb Extra Ag+ Alginate Dressing, 2x2 (in/in) 1 x Per Week/30 Days ary Discharge Instructions: Apply to wound bed as instructed Secondary Dressing: Drawtex 4x4 in 1 x Per Week/30 Days Discharge Instructions: Apply over primary dressing as directed. Secondary Dressing: Woven Gauze Sponge, Non-Sterile 4x4 in 1 x Per Week/30 Days Discharge Instructions: can use gauze between toes (if no desired) Secondary Dressing: Zetuvit Plus 4x8 in 1 x Per Week/30 Days Discharge Instructions: Apply over primary dressing as directed. Com pression Wrap: Urgo K2 Lite, (equivalent to a 3 layer) two layer compression system, regular 1 x Per Week/30  Days Discharge Instructions: Apply Urgo K2 Lite as directed (alternative to 3 layer compression). WOUND #13: - T Fourth Wound Laterality: Left oe Cleanser: Soap and Water 1 x Per Week/30 Days Discharge Instructions: May shower and wash wound with dial antibacterial soap and water prior to dressing change. Cleanser: Wound Cleanser 1 x Per Week/30 Days Discharge Instructions: Cleanse the wound with wound cleanser prior to applying a clean dressing using gauze sponges, not tissue or cotton balls. Peri-Wound Care: Sween Lotion (Moisturizing lotion) 1 x Per Week/30 Days Discharge Instructions: Apply moisturizing lotion as directed Topical: Gentamicin 1 x Per Week/30 Days Discharge Instructions: As directed by physician Topical: Mupirocin Ointment 1 x Per Week/30 Days Discharge Instructions: Apply Mupirocin (Bactroban) as instructed Topical: Ketoconazole Cream 2% 1 x Per Week/30 Days Discharge Instructions: Apply Ketoconazole as directed Topical: zinc 1 x Per Week/30 Days Prim Dressing: Maxorb Extra Ag+ Alginate Dressing, 2x2 (in/in) 1 x Per Week/30 Days ary Discharge Instructions: Apply to wound bed as instructed Secondary Dressing: Drawtex 4x4 in 1 x Per Week/30 Days Discharge Instructions: Apply over primary dressing as directed. Secondary Dressing: Woven Gauze Sponge, Non-Sterile 4x4 in 1 x Per Week/30 Days Discharge Instructions: can use gauze between toes (if no desired) Secondary Dressing: Zetuvit Plus 4x8 in 1 x Per Week/30 Days Discharge Instructions: Apply over primary dressing as directed. Com pression Wrap: Urgo K2 Lite, (equivalent to a 3 layer) two layer compression system, regular 1 x Per Week/30 Days Discharge Instructions: Apply Jeryl [place]  K2 Lite as directed (alternative to 3 layer compression). WOUND #6: - Foot Wound Laterality: Dorsal, Left Cleanser: Soap and Water 1 x Per Week/30 Days Discharge Instructions: May shower and wash wound with dial antibacterial soap and water prior  to dressing change. Cleanser: Wound Cleanser 1 x Per Week/30 Days Discharge Instructions: Cleanse the wound with wound cleanser prior to applying a clean dressing using gauze sponges, not tissue or cotton balls. Peri-Wound Care: Sween Lotion (Moisturizing lotion) 1 x Per Week/30 Days [name], [name] ([id number]) [phone].pdf Page 18 of 20 Discharge Instructions: Apply moisturizing lotion as directed Topical: Gentamicin 1 x Per Week/30 Days Discharge Instructions: As directed by physician Topical: Mupirocin Ointment 1 x Per Week/30 Days Discharge Instructions: Apply Mupirocin (Bactroban) as instructed Topical: Ketoconazole Cream 2% 1 x Per Week/30 Days Discharge Instructions: Apply Ketoconazole as directed Topical: zinc 1 x Per Week/30 Days Prim Dressing: Maxorb Extra Ag+ Alginate Dressing, 2x2 (in/in) 1 x Per Week/30 Days ary Discharge Instructions: Apply to wound bed as instructed Secondary Dressing: Drawtex 4x4 in 1 x Per Week/30 Days Discharge Instructions: Apply over primary dressing as directed. Secondary Dressing: Woven Gauze Sponge, Non-Sterile 4x4 in 1 x Per Week/30 Days Discharge Instructions: can use gauze between toes (if no desired) Secondary Dressing: Zetuvit Plus 4x8 in 1 x Per Week/30 Days Discharge Instructions: Apply over primary dressing as directed. Com pression Wrap: Urgo K2 Lite, (equivalent to a 3 layer) two layer compression system, regular 1 x Per Week/30 Days Discharge Instructions: Apply Urgo K2 Lite as directed (alternative to 3 layer compression). WOUND #8: - Foot Wound Laterality: Left, Lateral Cleanser: Soap and Water 1 x Per Week/30 Days Discharge Instructions: May shower and wash wound with dial antibacterial soap and water prior to dressing change. Cleanser: Wound Cleanser 1 x Per Week/30 Days Discharge Instructions: Cleanse the wound with wound cleanser prior to applying a clean dressing using gauze sponges, not tissue or cotton  balls. Peri-Wound Care: Sween Lotion (Moisturizing lotion) 1 x Per Week/30 Days Discharge Instructions: Apply moisturizing lotion as directed Topical: Gentamicin 1 x Per Week/30 Days Discharge Instructions: As directed by physician Topical: Mupirocin Ointment 1 x Per Week/30 Days Discharge Instructions: Apply Mupirocin (Bactroban) as instructed Topical: Ketoconazole Cream 2% 1 x Per Week/30 Days Discharge Instructions: Apply Ketoconazole as directed Topical: zinc 1 x Per Week/30 Days Prim Dressing: Maxorb Extra Ag+ Alginate Dressing, 2x2 (in/in) 1 x Per Week/30 Days ary Discharge Instructions: Apply to wound bed as instructed Secondary Dressing: Drawtex 4x4 in 1 x Per Week/30 Days Discharge Instructions: Apply over primary dressing as directed. Secondary Dressing: Woven Gauze Sponge, Non-Sterile 4x4 in 1 x Per Week/30 Days Discharge Instructions: can use gauze between toes (if no desired) Secondary Dressing: Zetuvit Plus 4x8 in 1 x Per Week/30 Days Discharge Instructions: Apply over primary dressing as directed. Com pression Wrap: Urgo K2 Lite, (equivalent to a 3 layer) two layer compression system, regular 1 x Per Week/30 Days Discharge Instructions: Apply Urgo K2 Lite as directed (alternative to 3 layer compression). WOUND #9: - T Great Wound Laterality: Left, Lateral oe Cleanser: Soap and Water 1 x Per Week/30 Days Discharge Instructions: May shower and wash wound with dial antibacterial soap and water prior to dressing change. Cleanser: Wound Cleanser 1 x Per Week/30 Days Discharge Instructions: Cleanse the wound with wound cleanser prior to applying a clean dressing using gauze sponges, not tissue or cotton balls. Peri-Wound Care: Sween Lotion (Moisturizing lotion) 1 x Per Week/30 Days Discharge Instructions: Apply moisturizing lotion as  directed Topical: Gentamicin 1 x Per Week/30 Days Discharge Instructions: As directed by physician Topical: Mupirocin Ointment 1 x Per Week/30  Days Discharge Instructions: Apply Mupirocin (Bactroban) as instructed Topical: Ketoconazole Cream 2% 1 x Per Week/30 Days Discharge Instructions: Apply Ketoconazole as directed Topical: zinc 1 x Per Week/30 Days Prim Dressing: Maxorb Extra Ag+ Alginate Dressing, 2x2 (in/in) 1 x Per Week/30 Days ary Discharge Instructions: Apply to wound bed as instructed Secondary Dressing: Drawtex 4x4 in 1 x Per Week/30 Days Discharge Instructions: Apply over primary dressing as directed. Secondary Dressing: Woven Gauze Sponge, Non-Sterile 4x4 in 1 x Per Week/30 Days Discharge Instructions: can use gauze between toes (if no desired) Secondary Dressing: Zetuvit Plus 4x8 in 1 x Per Week/30 Days Discharge Instructions: Apply over primary dressing as directed. Com pression Wrap: Urgo K2 Lite, (equivalent to a 3 layer) two layer compression system, regular 1 x Per Week/30 Days Discharge Instructions: Apply Urgo K2 Lite as directed (alternative to 3 layer compression). 02/05/2023: All of the wounds deteriorated quite a bit this week. There was much more drainage on her dressing today and it had a putrid odor. She admits to not keeping her leg elevated. I used a curette to debride slough from all the wounds and some eschar from the lateral foot wound as well. We had left the gentamicin and mupirocin off of her dressing last week and I think we need to resume using that. We are also going to add add back the layers of absorbent material we had not been using due to the fact that she had not had much drainage. Continue silver alginate. She was reminded to keep her legs elevated and hopefully we can get these wounds back on track. Electronic Signature(s) Signed: 02/05/2023 3:36:43 PM By: [name] MD FACS Previous Signature: 02/05/2023 3:26:53 PM Version By: [name] MD FACS Entered By: [name] on 02/05/2023 12:36:42 Ashbaugh, [name] ([id number]) [id number] Page  19 of 20 -------------------------------------------------------------------------------- HxROS Details Patient Name: Date of Service: [name] 02/05/2023 2:15 PM Medical Record Number: [id number] Patient Account Number: [id number] Date of Birth/Sex: Treating RN: Oct 24, 1940 (82 y.o. 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 Endocrine Medical History: Negative for: Type I Diabetes; Type II Diabetes Past Medical History Notes: hypothyroidism Musculoskeletal Medical History: Positive for: 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 Neurologic Medical History: Past Medical History Notes: stroke Immunizations Pneumococcal Vaccine: Received Pneumococcal Vaccination: Yes Received Pneumococcal Vaccination On or After 60th Birthday: Yes Implantable Devices None Hospitalization / Surgery History Type of Hospitalization/Surgery cholecystectomy leg surgery tonsillectomy Family and Social History Unknown History: Yes; Former smoker - smoked when she was young; Marital Status - Widowed; Alcohol Use: Never; Drug Use: No History; Caffeine Use: Daily; Financial Concerns: No; Food, Clothing or Shelter Needs: No; Support System Lacking: No; Transportation Concerns: No Electronic Signature(s) Signed: 02/05/2023 4:15:27 PM By: [name] MD FACS Entered By: [name] on 02/05/2023 12:25:02 [name] ([id number]) [id number] Page 20 of 20 -------------------------------------------------------------------------------- SuperBill Details Patient Name: Date of Service: [name] 02/05/2023 Medical Record Number: [id number] Patient Account Number: [id number] Date of Birth/Sex: Treating RN: Jan 26, 1941 (82 y.o. 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 I10 Essential (primary) hypertension 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 atrial fibrillation Z79.01 Long term (current) use of anticoagulants I89.0 Lymphedema, not elsewhere classified Facility Procedures : CPT4 Code: [id number] Description: 7597 - DEBRIDE WOUND 1ST 20 SQ CM OR < ICD-10 Diagnosis Description 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  witho L97.525 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 wit Modifier: ut evidence of necr hout evidence of ne Quantity: 1 osis crosis Physician Procedures : CPT4 Code Description Modifier [id number] - WC PHYS LEVEL 4 - EST PT 25 ICD-10 Diagnosis Description L97.522 Non-pressure chronic ulcer of other part of left foot with fat layer exposed L97.526 Non-pressure chronic ulcer of other part of left foot  with bone involvement without evidence of necro L97.525 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 without evidence of nec I89.0 Lymphedema, not elsewhere classified Quantity: 1 sis rosis : [id number]  [id number] - WC PHYS DEBR WO ANESTH 20 SQ CM ICD-10 Diagnosis Description 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

## 2023-02-05 NOTE — Telephone Encounter (Signed)
[name] walked into office and triage RN was notified.  Pt appt scheduled to discuss amputation surgery with Dr. Lenell Antu at his next available appt. Confirmed understanding.

## 2023-02-05 NOTE — Progress Notes (Signed)
AMELIE, HOLLARS ([id number]) [id number] Page 1 of 17 Visit Report for 02/05/2023 Arrival Information Details Patient Name: Date of Service: [name] 02/05/2023 2:15 PM Medical Record Number: [id number] Patient Account Number: [id number] Date of Birth/Sex: Treating RN: 1940/10/27 (82 y.o. [name] Primary Care Taraya Steward: [name], [facility] Other Clinician: Referring Briann Sarchet: Treating Olean Sangster/Extender: [name], [facility] Weeks in Treatment: 30 Visit Information History Since Last Visit Added or deleted any medications: No Patient Arrived: Wheel Chair Any new allergies or adverse reactions: No Arrival Time: 14:24 Had a fall or experienced change in No Accompanied By: caregiver activities of daily living that may affect Transfer Assistance: Manual risk of falls: Patient Identification Verified: Yes Signs or symptoms of abuse/neglect since last visito No Secondary Verification Process Completed: Yes Hospitalized since last visit: No Patient Requires Transmission-Based Precautions: No Implantable device outside of the clinic excluding No Patient Has Alerts: Yes cellular tissue based products placed in the center Patient Alerts: ABIs: R:0.82 L:0.84 7/24 since last visit: Has Dressing in Place as Prescribed: Yes Has Compression in Place as Prescribed: Yes Pain Present Now: No Electronic Signature(s) Signed: 02/05/2023 3:55:41 PM By: [name] Entered By: [name] on 02/05/2023 14:24:22 -------------------------------------------------------------------------------- Compression Therapy Details Patient Name: Date of Service: [name] RO N 02/05/2023 2:15 PM Medical Record Number: [id number] Patient Account Number: [id number] Date of Birth/Sex: Treating RN: Sep 12, 1940 (82 y.o. [name] Primary Care Michele Judy: [name], [facility] Other Clinician: Referring Teejay Meader: Treating Kenzlee Fishburn/Extender:  [name] SHO [name], [facility] Weeks in Treatment: 68 Compression Therapy Performed for Wound Assessment: Wound #6 Left,Dorsal Foot Performed By: Clinician [name], RN Compression Type: Double Layer Post Procedure Diagnosis Same as Pre-procedure Electronic Signature(s) Signed: 02/05/2023 3:55:41 PM By: [name] Entered By: [name] on 02/05/2023 15:10:42 Bonus, Sujey ([id number]) [id number] Page 2 of 17 -------------------------------------------------------------------------------- Encounter Discharge Information Details Patient Name: Date of Service: [name] 02/05/2023 2:15 PM Medical Record Number: [id number] Patient Account Number: [id number] Date of Birth/Sex: Treating RN: [birth year] (82 y.o. [name] Primary Care [name]: [name], [facility] Other Clinician: Referring Hansen Carino: Treating Airam Runions/Extender: [name] SHO [name], [facility] Weeks in Treatment: 11 Encounter Discharge Information Items Post Procedure Vitals Discharge Condition: Stable Temperature (F): 98.1 Ambulatory Status: Wheelchair Pulse (bpm): 99 Discharge Destination: Skilled Nursing Facility Respiratory Rate (breaths/min): 16 Telephoned: No Blood Pressure (mmHg): 117/72 Orders Sent: Yes Transportation: Private Auto Accompanied By: caregiver Schedule Follow-up Appointment: Yes Clinical Summary of Care: Patient Declined Electronic Signature(s) Signed: 02/05/2023 3:55:41 PM By: [name] Entered By: [name] on 02/05/2023 15:25:26 -------------------------------------------------------------------------------- Lower Extremity Assessment Details Patient Name: Date of Service: [name] RO N 02/05/2023 2:15 PM Medical Record Number: [id number] Patient Account Number: [id number] Date of Birth/Sex: Treating RN: May 09, 1940 (82 y.o. [name] Primary Care Wane Mollett: [name], [facility] Other  Clinician: Referring Maddox Hlavaty: Treating Coleby Yett/Extender: [name] SHO [name], [facility] Weeks in Treatment: 34 Edema Assessment Assessed: [Left: No] [Right: No] Edema: [Left: Ye] [Right: s] Calf Left: Right: Point of Measurement: From Medial Instep 27.8 cm 27.5 cm Ankle Left: Right: Point of Measurement: From Medial Instep 20.5 cm 19 cm Vascular Assessment Pulses: Dorsalis Pedis Palpable: [Left:Yes] Extremity colors, hair growth, and conditions: Extremity Color: [Left:Hyperpigmented] Hair Growth on Extremity: [Left:No] [Right:No] Temperature of Extremity: [Left:Cool] [Right:Cool] Capillary Refill: [Left:> 3 seconds] Dependent Rubor: [Left:Yes No] [Right:Yes No] Electronic Signature(s) Signed: 02/05/2023 3:55:41 PM By: [name] Entered By: [name] on 02/05/2023 14:35:28 Peltzer, Aarohi ([id number]) [id number] Page  3 of 17 -------------------------------------------------------------------------------- Multi Wound Chart Details Patient Name: Date of Service: [name] 02/05/2023 2:15 PM Medical Record Number: [id number] Patient Account Number: [id number] Date of Birth/Sex: Treating RN: Mar 31, 1941 (82 y.o. F) Primary Care Honore Wipperfurth: [name], [facility] Other Clinician: Referring Sherwin Hollingshed: Treating Marquail Bradwell/Extender: [name], [facility] Weeks in Treatment: 30 Vital Signs Height(in): 67 Pulse(bpm): 99 Weight(lbs): 153 Blood Pressure(mmHg): 117/72 Body Mass Index(BMI): 24 Temperature(F): 98.1 Respiratory Rate(breaths/min): 16 [10:Photos:] Left T Second oe Left, Plantar T Third oe Left T Fourth oe Wound Location: Gradually Appeared Gradually Appeared Gradually Appeared Wounding Event: Lymphedema Lymphedema Atypical Primary Etiology: Anemia, Angina, Hypertension, Anemia, Angina, Hypertension, Anemia, Angina, Hypertension, Comorbid History: Vasculitis, Osteoarthritis Vasculitis, Osteoarthritis  Vasculitis, Osteoarthritis 09/05/2022 01/30/2023 02/05/2023 Date Acquired: 8 0 0 Weeks of Treatment: Open Open Open Wound Status: No No No Wound Recurrence: 0.3x0.5x0.1 0.5x0.3x0.1 0.3x1x0.1 Measurements L x W x D (cm) 0.118 0.118 0.236 A (cm) : rea 0.012 0.012 0.024 Volume (cm) : 39.80% 62.40% N/A % Reduction in A rea: 40.00% 61.30% N/A % Reduction in Volume: Full Thickness With Exposed Support 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 Full Thickness With Exposed Support Classification: Structures Support Structures Structures Medium Medium Medium Exudate A mount: Serosanguineous Serosanguineous Serosanguineous Exudate Type: red, brown red, brown red, brown Exudate Color: Distinct, outline attached Distinct, outline attached Distinct, outline attached Wound Margin: Small (1-33%) Small (1-33%) Small (1-33%) Granulation A mount: Pink Red Pink Granulation Quality: Large (67-100%) Large (67-100%) Large (67-100%) Necrotic A mount: Adherent Slough Adherent [employer] Necrotic Tissue: Fat Layer (Subcutaneous Tissue): Yes Fat Layer (Subcutaneous Tissue): Yes Fat Layer (Subcutaneous Tissue): Yes Exposed Structures: Fascia: No Fascia: No Bone: Yes Tendon: No Tendon: No Fascia: No Muscle: No Muscle: No Tendon: No Joint: No Joint: No Muscle: No Bone: No Bone: No Joint: No Small (1-33%) None None Epithelialization: Debridement - Selective/Open Wound Debridement - Selective/Open Wound Debridement - Selective/Open Wound Debridement: Pre-procedure Verification/Time Out 15:01 15:01 15:01 Taken: Lidocaine 4% Topical Solution Lidocaine 4% Topical Solution Lidocaine 4% Topical Solution Pain Control: Necrotic/Eschar, Ambulance person, Ambulance person, [employer] Tissue Debrided: Non-Viable Tissue Non-Viable Tissue Non-Viable Tissue Level: 0.12 0.12 0.24 Debridement A (sq cm): rea Curette Curette Curette Instrument: Minimum Minimum  Minimum Bleeding: Pressure Pressure Pressure Hemostasis A chieved: Procedure was tolerated well Procedure was tolerated well Procedure was tolerated well Debridement Treatment Response: 0.3x0.5x0.1 0.5x0.3x0.1 0.3x1x0.1 Post Debridement Measurements L x W x D (cm) 0.012 0.012 0.024 Post Debridement Volume: (cm) N/A N/A N/A Post Debridement StageChannell Quattrone, [name] ([id number]) [id number] Page 4 of 17 No Abnormalities Noted Excoriation: Yes No Abnormalities Noted Periwound Skin Texture: Maceration: Yes Maceration: Yes Maceration: Yes Periwound Skin Moisture: No Abnormalities Noted No Abnormalities Noted No Abnormalities Noted Periwound Skin Color: No Abnormality No Abnormality No Abnormality Temperature: Yes Yes Yes Tenderness on Palpation: Debridement Debridement Debridement Procedures Performed: Wound Number: [id number]  Photos: Left, Dorsal Foot Left, Lateral Foot Left, Lateral T Great oe Wound Location: Gradually Appeared Pressure Injury Gradually Appeared 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, Angina, Hypertension, Anemia, Angina, Hypertension, Anemia, Angina, Hypertension, Comorbid History: Vasculitis, Osteoarthritis Vasculitis, Osteoarthritis Vasculitis, Osteoarthritis 09/05/2022 10/24/2022 11/01/2022 Date Acquired: 21 14 13  Weeks of Treatment: Open Open Open Wound Status: No No No Wound Recurrence: 2.6x2.5x0.1 0.5x0.7x0.2 0.1x0.3x0.1 Measurements L x W x D (cm) 5.105 0.275 0.024 A (cm) : rea 0.511 0.055 0.002 Volume (cm) : 86.90% -40.30% 96.50% % Reduction in A rea: 86.90% -41.00% 97.10% % Reduction in Volume: 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  Category/Stage IV Full Thickness With Exposed Support Classification: Support Structures Structures Medium Medium None Present Exudate A mount: Serosanguineous Serosanguineous N/A Exudate Type: red, brown red, brown N/A Exudate Color: Indistinct,  nonvisible Distinct, outline attached Flat and Intact Wound Margin: Small (1-33%) None Present (0%) None Present (0%) Granulation A mount: Pink N/A N/A Granulation Quality: Large (67-100%) Large (67-100%) Large (67-100%) Necrotic A mount: Adherent Slough Adherent Slough Eschar Necrotic Tissue: Fat Layer (Subcutaneous Tissue): Yes Fat Layer (Subcutaneous Tissue): Yes Fat Layer (Subcutaneous Tissue): Yes Exposed Structures: Fascia: No Fascia: No Bone: Yes Tendon: No Tendon: No Fascia: No Muscle: No Muscle: No Tendon: No Joint: No Joint: No Muscle: No Bone: No Bone: No Joint: No Medium (34-66%) Small (1-33%) Large (67-100%) Epithelialization: Debridement - Selective/Open Wound Debridement - Selective/Open Wound Debridement - Selective/Open Wound Debridement: Pre-procedure Verification/Time Out 15:01 15:01 15:01 Taken: Lidocaine 4% Topical Solution Lidocaine 4% Topical Solution Lidocaine 4% Topical Solution Pain Control: Necrotic/Eschar, Ambulance person, Ambulance person, [employer] Tissue Debrided: Non-Viable Tissue Non-Viable Tissue Non-Viable Tissue Level: 5.1 0.27 0.02 Debridement A (sq cm): rea Curette Curette Curette Instrument: Minimum Minimum Minimum Bleeding: Pressure Pressure Pressure Hemostasis A chieved: Procedure was tolerated well Procedure was tolerated well Procedure was tolerated well Debridement Treatment Response: 2.6x2.5x0.1 0.5x0.7x0.2 0.1x0.3x0.1 Post Debridement Measurements L x W x D (cm) 0.511 0.055 0.002 Post Debridement Volume: (cm) N/A Category/Stage IV N/A Post Debridement Stage: Excoriation: Yes No Abnormalities Noted No Abnormalities Noted Periwound Skin Texture: Maceration: Yes Dry/Scaly: Yes No Abnormalities Noted Periwound Skin Moisture: Rubor: Yes No Abnormalities Noted Rubor: Yes Periwound Skin Color: No Abnormality No Abnormality No Abnormality Temperature: Yes Yes Yes Tenderness on Palpation: Compression  Therapy Debridement Debridement Procedures Performed: Debridement Treatment Notes Electronic Signature(s) Signed: 02/05/2023 3:23:30 PM By: [name] MD FACS Entered By: [name] on 02/05/2023 15:23:29 Cunliffe, [name] ([id number]) [id number] Page 5 of 17 -------------------------------------------------------------------------------- Multi-Disciplinary Care Plan Details Patient Name: Date of Service: [name] 02/05/2023 2:15 PM Medical Record Number: [id number] Patient Account Number: [id number] Date of Birth/Sex: Treating RN: Jun 07, 1940 (82 y.o. [name] Primary Care Crixus Mcaulay: [name], [facility] Other Clinician: Referring Martasia Talamante: Treating Raeli Wiens/Extender: [name], [facility] Weeks in Treatment: 58 Multidisciplinary Care Plan reviewed with physician Active Inactive Necrotic Tissue Nursing Diagnoses: Impaired tissue integrity related to necrotic/devitalized tissue Knowledge deficit related to management of necrotic/devitalized tissue Goals: Necrotic/devitalized tissue will be minimized in the wound bed Date Initiated: 06/12/2022 Target Resolution Date: 02/22/2023 Goal Status: Active Patient/caregiver will verbalize understanding of reason and process for debridement of necrotic tissue Date Initiated: 06/12/2022 Target Resolution Date: 02/22/2023 Goal Status: Active Interventions: Assess patient pain level pre-, during and post procedure and prior to discharge Provide education on necrotic tissue and debridement process Treatment Activities: Apply topical anesthetic as ordered : 06/12/2022 Notes: Wound/Skin Impairment Nursing Diagnoses: Impaired tissue integrity Knowledge deficit related to ulceration/compromised skin integrity Goals: Patient/caregiver will verbalize understanding of skin care regimen Date Initiated: 06/12/2022 Target Resolution Date: 02/22/2023 Goal Status:  Active Interventions: Assess ulceration(s) every visit Treatment Activities: Skin care regimen initiated : 06/12/2022 Topical wound management initiated : 06/12/2022 Notes: Electronic Signature(s) Signed: 02/05/2023 3:55:41 PM By: [name] Entered By: [name] on 02/05/2023 15:01:14 Maue, Makaylah ([id number]) [id number] Page 6 of 17 -------------------------------------------------------------------------------- Pain Assessment Details Patient Name: Date of Service: [name] 02/05/2023 2:15 PM Medical Record Number: [id number] Patient Account Number: [id number] Date of Birth/Sex: Treating RN: [birth year] (82 y.o. [name] Primary Care Bryony Kaman: [name], [facility] Other  Clinician: Referring Camari Wisham: Treating Quinlynn Cuthbert/Extender: [name] SHO [name], [facility] Weeks in Treatment: 21 Active Problems Location of Pain Severity and Description of Pain Patient Has Paino No Site Locations Rate the pain. Current Pain Level: 0 Pain Management and Medication Current Pain Management: Electronic Signature(s) Signed: 02/05/2023 3:55:41 PM By: [name] Entered By: [name] on 02/05/2023 14:27:25 -------------------------------------------------------------------------------- Patient/Caregiver Education Details Patient Name: Date of Service: [name] RO N 10/15/2024andnbsp2:15 PM Medical Record Number: [id number] Patient Account Number: [id number] Date of Birth/Gender: Treating RN: 1941-03-05 (82 y.o. [name] Primary Care Physician: [name], [facility] Other Clinician: Referring Physician: Treating Physician/Extender: [name], [facility] Weeks in Treatment: 22 Education Assessment Education Provided To: Patient Education Topics Provided Wound/Skin Impairment: Methods: Explain/Verbal Responses: Reinforcements needed, State content correctly Electronic Signature(s) Signed:  02/05/2023 3:55:41 PM By: [name] Entered By: [name] on 02/05/2023 15:01:34 Galen, Prapti ([id number]) [id number] Page 7 of 17 -------------------------------------------------------------------------------- Wound Assessment Details Patient Name: Date of Service: [name] 02/05/2023 2:15 PM Medical Record Number: [id number] Patient Account Number: [id number] Date of Birth/Sex: Treating RN: [birth year] (82 y.o. [name] Primary Care Clista Rainford: [name], [facility] Other Clinician: Referring Salik Grewell: Treating Lolita Faulds/Extender: [name] SHO [name], [facility] Weeks in Treatment: 34 Wound Status Wound Number: 10 Primary Etiology: Lymphedema Wound Location: Left T Second oe Wound Status: Open Wounding Event: Gradually Appeared Comorbid History: Anemia, Angina, Hypertension, Vasculitis, Osteoarthritis Date Acquired: 09/05/2022 Weeks Of Treatment: 8 Clustered Wound: No Photos Wound Measurements Length: (cm) Width: (cm) Depth: (cm) Area: (cm) Volume: (cm) 0.3 % Reduction in Area: 39.8% 0.5 % Reduction in Volume: 40% 0.1 Epithelialization: Small (1-33%) 0.118 Tunneling: No 0.012 Undermining: No Wound Description Classification: Full Thickness With Exposed Suppor Wound Margin: Distinct, outline attached Exudate Amount: Medium Exudate Type: Serosanguineous Exudate Color: red, brown t Structures Foul Odor After Cleansing: No Slough/Fibrino Yes Wound Bed Granulation Amount: Small (1-33%) Exposed Structure Granulation Quality: Pink Fascia Exposed: No Necrotic Amount: Large (67-100%) Fat Layer (Subcutaneous Tissue) Exposed: Yes Necrotic Quality: Adherent Slough Tendon Exposed: No Muscle Exposed: No Joint Exposed: No Bone Exposed: No Periwound Skin Texture Texture Color No Abnormalities Noted: Yes No Abnormalities Noted: Yes Moisture Temperature / Pain No Abnormalities Noted: Yes Temperature: No  Abnormality Tenderness on Palpation: Yes Treatment Notes Wound #10 (Toe Second) Wound Laterality: Left Tibbs, Vantasia ([id number]) [id number] Page 8 of 17 Cleanser Soap and Water Discharge Instruction: May shower and wash wound with dial antibacterial soap and water prior to dressing change. Wound Cleanser Discharge Instruction: Cleanse the wound with wound cleanser prior to applying a clean dressing using gauze sponges, not tissue or cotton balls. Peri-Wound Care Sween Lotion (Moisturizing lotion) Discharge Instruction: Apply moisturizing lotion as directed Topical Gentamicin Discharge Instruction: As directed by physician Mupirocin Ointment Discharge Instruction: Apply Mupirocin (Bactroban) as instructed Ketoconazole Cream 2% Discharge Instruction: Apply Ketoconazole as directed zinc Primary Dressing Maxorb Extra Ag+ Alginate Dressing, 2x2 (in/in) Discharge Instruction: Apply to wound bed as instructed Secondary Dressing Drawtex 4x4 in Discharge Instruction: Apply over primary dressing as directed. Woven Gauze Sponge, Non-Sterile 4x4 in Discharge Instruction: can use gauze between toes (if no desired) Zetuvit Plus 4x8 in Discharge Instruction: Apply over primary dressing as directed. Secured With Compression Wrap Urgo K2 Lite, (equivalent to a 3 layer) two layer compression system, regular Discharge Instruction: Apply Urgo K2 Lite as directed (alternative to 3 layer compression). Compression Stockings Add-Ons Electronic Signature(s) Signed: 02/05/2023 3:55:41 PM By: [name] Entered By: [name] on 02/05/2023 14:47:27 --------------------------------------------------------------------------------  Wound Assessment Details Patient Name: Date of Service: [name] 02/05/2023 2:15 PM Medical Record Number: [id number] Patient Account Number: [id number] Date of Birth/Sex: Treating RN: Aug 25, 1940 (82 y.o. [name] Primary Care Raffaella Edison: [name], [facility] Other Clinician: Referring Cincere Deprey: Treating Torina Ey/Extender: [name] SHO KES, [facility] Weeks in Treatment: 34 Wound Status Wound Number: 12 Primary Etiology: Lymphedema Wound Location: Left, Plantar T Third oe Wound Status: Open Wounding Event: Gradually Appeared Comorbid History: Anemia, Angina, Hypertension, Vasculitis, Osteoarthritis Date Acquired: 01/30/2023 Weeks Of Treatment: 0 Clustered Wound: No Photos Schimek, Maddelyn ([id number]) [id number] Page 9 of 17 Wound Measurements Length: (cm) 0.5 Width: (cm) 0.3 Depth: (cm) 0.1 Area: (cm) 0.118 Volume: (cm) 0.012 % Reduction in Area: 62.4% % Reduction in Volume: 61.3% Epithelialization: None Tunneling: No Undermining: No Wound Description Classification: 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 Support Structures Wound Margin: Distinct, outline attached Exudate Amount: Medium Exudate Type: Serosanguineous Exudate Color: red, brown Foul Odor After Cleansing: No Slough/Fibrino Yes Wound Bed Granulation Amount: Small (1-33%) Exposed Structure Granulation Quality: Red Fascia Exposed: No Necrotic Amount: Large (67-100%) Fat Layer (Subcutaneous Tissue) Exposed: Yes Necrotic Quality: Adherent Slough Tendon Exposed: No Muscle Exposed: No Joint Exposed: No Bone Exposed: No Periwound Skin Texture Texture Color No Abnormalities Noted: No No Abnormalities Noted: Yes Excoriation: Yes Temperature / Pain Temperature: No Abnormality Moisture No Abnormalities Noted: No Tenderness on Palpation: Yes Maceration: Yes Treatment Notes Wound #12 (Toe Third) Wound Laterality: Plantar, Left Cleanser Soap and Water Discharge Instruction: May shower and wash wound with dial antibacterial soap and water prior to dressing change. Wound Cleanser Discharge Instruction: Cleanse the wound with wound cleanser prior to applying a clean dressing using gauze sponges,  not tissue or cotton balls. Peri-Wound Care Sween Lotion (Moisturizing lotion) Discharge Instruction: Apply moisturizing lotion as directed Topical Gentamicin Discharge Instruction: As directed by physician Mupirocin Ointment Discharge Instruction: Apply Mupirocin (Bactroban) as instructed Ketoconazole Cream 2% Discharge Instruction: Apply Ketoconazole as directed zinc Primary Dressing Maxorb Extra Ag+ Alginate Dressing, 2x2 (in/in) Discharge Instruction: Apply to wound bed as instructed Secondary Dressing MACWILLIAMS, [name] ([id number]) [id number] Page 10 of 17 Drawtex 4x4 in Discharge Instruction: Apply over primary dressing as directed. Woven Gauze Sponge, Non-Sterile 4x4 in Discharge Instruction: can use gauze between toes (if no desired) Zetuvit Plus 4x8 in Discharge Instruction: Apply over primary dressing as directed. Secured With Compression Wrap Urgo K2 Lite, (equivalent to a 3 layer) two layer compression system, regular Discharge Instruction: Apply Urgo K2 Lite as directed (alternative to 3 layer compression). Compression Stockings Add-Ons Electronic Signature(s) Signed: 02/05/2023 3:55:41 PM By: [name] Entered By: [name] on 02/05/2023 14:47:59 -------------------------------------------------------------------------------- Wound Assessment Details Patient Name: Date of Service: [name] RO N 02/05/2023 2:15 PM Medical Record Number: [id number] Patient Account Number: [id number] Date of Birth/Sex: Treating RN: 1940-09-06 (82 y.o. [name] Primary Care Chanci Ojala: [name], [facility] Other Clinician: Referring Cinzia Devos: Treating Destenie Ingber/Extender: [name] SHO [name], [facility] Weeks in Treatment: 34 Wound Status Wound Number: 13 Primary Etiology: Atypical Wound Location: Left T Fourth oe Wound Status: Open Wounding Event: Gradually Appeared Comorbid History: Anemia, Angina, Hypertension,  Vasculitis, Osteoarthritis Date Acquired: 02/05/2023 Weeks Of Treatment: 0 Clustered Wound: No Photos Wound Measurements Length: (cm) 0.3 Width: (cm) 1 Depth: (cm) 0.1 Area: (cm) 0.236 Volume: (cm) 0.024 % Reduction in Area: % Reduction in Volume: Epithelialization: None Tunneling: No Undermining: No Wound Description Classification: Full Thickness With Exposed Support Structures Wound Margin: Distinct, outline attached Exudate Amount: Medium Exudate Type: Serosanguineous  Exudate Color: red, brown Vasko, Dannie ([id number]) Wound Bed Granulation Amount: Small (1-33%) Granulation Quality: Pink Necrotic Amount: Large (67-100%) Necrotic Quality: Adherent Slough Foul Odor After Cleansing: No Slough/Fibrino Yes [id number] Page 11 of 17 Exposed Structure Fascia Exposed: No Fat Layer (Subcutaneous Tissue) Exposed: Yes Tendon Exposed: No Muscle Exposed: No Joint Exposed: No Bone Exposed: Yes Periwound Skin Texture Texture Color No Abnormalities Noted: Yes No Abnormalities Noted: Yes Moisture Temperature / Pain No Abnormalities Noted: No Temperature: No Abnormality Maceration: Yes Tenderness on Palpation: Yes Treatment Notes Wound #13 (Toe Fourth) Wound Laterality: Left Cleanser Soap and Water Discharge Instruction: May shower and wash wound with dial antibacterial soap and water prior to dressing change. Wound Cleanser Discharge Instruction: Cleanse the wound with wound cleanser prior to applying a clean dressing using gauze sponges, not tissue or cotton balls. Peri-Wound Care Sween Lotion (Moisturizing lotion) Discharge Instruction: Apply moisturizing lotion as directed Topical Gentamicin Discharge Instruction: As directed by physician Mupirocin Ointment Discharge Instruction: Apply Mupirocin (Bactroban) as instructed Ketoconazole Cream 2% Discharge Instruction: Apply Ketoconazole as directed zinc Primary Dressing Maxorb Extra  Ag+ Alginate Dressing, 2x2 (in/in) Discharge Instruction: Apply to wound bed as instructed Secondary Dressing Drawtex 4x4 in Discharge Instruction: Apply over primary dressing as directed. Woven Gauze Sponge, Non-Sterile 4x4 in Discharge Instruction: can use gauze between toes (if no desired) Zetuvit Plus 4x8 in Discharge Instruction: Apply over primary dressing as directed. Secured With Compression Wrap Urgo K2 Lite, (equivalent to a 3 layer) two layer compression system, regular Discharge Instruction: Apply Urgo K2 Lite as directed (alternative to 3 layer compression). Compression Stockings Add-Ons Electronic Signature(s) Signed: 02/05/2023 3:55:41 PM By: [name] Entered By: [name] on 02/05/2023 14:48:34 Fury, Gearldene ([id number]) [id number] Page 12 of 17 -------------------------------------------------------------------------------- Wound Assessment Details Patient Name: Date of Service: [name] 02/05/2023 2:15 PM Medical Record Number: [id number] Patient Account Number: [id number] Date of Birth/Sex: Treating RN: 1941/01/09 (82 y.o. [name] Primary Care Michaeal Davis: [name], [facility] Other Clinician: Referring Herma Uballe: Treating Tysin Salada/Extender: [name] SHO [name], [facility] Weeks in Treatment: 34 Wound Status Wound Number: 6 Primary Etiology: Lymphedema Wound Location: Left, Dorsal Foot Wound Status: Open Wounding Event: Gradually Appeared Comorbid History: Anemia, Angina, Hypertension, Vasculitis, Osteoarthritis Date Acquired: 09/05/2022 Weeks Of Treatment: 21 Clustered Wound: No Photos Wound Measurements Length: (cm) 2.6 Width: (cm) 2.5 Depth: (cm) 0.1 Area: (cm) 5.105 Volume: (cm) 0.511 % Reduction in Area: 86.9% % Reduction in Volume: 86.9% Epithelialization: Medium (34-66%) Tunneling: No Undermining: No Wound Description Classification: 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 Support Wound  Margin: Indistinct, nonvisible Exudate Amount: Medium Exudate Type: Serosanguineous Exudate Color: red, brown Structures Foul Odor After Cleansing: No Slough/Fibrino Yes Wound Bed Granulation Amount: Small (1-33%) Exposed Structure Granulation Quality: Pink Fascia Exposed: No Necrotic Amount: Large (67-100%) Fat Layer (Subcutaneous Tissue) Exposed: Yes Necrotic Quality: Adherent Slough Tendon Exposed: No Muscle Exposed: No Joint Exposed: No Bone Exposed: No Periwound Skin Texture Texture Color No Abnormalities Noted: Yes No Abnormalities Noted: No Rubor: Yes Moisture No Abnormalities Noted: Yes Temperature / Pain Temperature: No Abnormality Tenderness on Palpation: Yes Treatment Notes Wound #6 (Foot) Wound Laterality: Dorsal, Left Cleanser Stahlecker, Latrish ([id number]) [id number] Page 13 of 17 Soap and Water Discharge Instruction: May shower and wash wound with dial antibacterial soap and water prior to dressing change. Wound Cleanser Discharge Instruction: Cleanse the wound with wound cleanser prior to applying a clean dressing using gauze sponges, not tissue or cotton balls. Peri-Wound Care Sween Lotion (Moisturizing lotion) Discharge Instruction: Apply  moisturizing lotion as directed Topical Gentamicin Discharge Instruction: As directed by physician Mupirocin Ointment Discharge Instruction: Apply Mupirocin (Bactroban) as instructed Ketoconazole Cream 2% Discharge Instruction: Apply Ketoconazole as directed zinc Primary Dressing Maxorb Extra Ag+ Alginate Dressing, 2x2 (in/in) Discharge Instruction: Apply to wound bed as instructed Secondary Dressing Drawtex 4x4 in Discharge Instruction: Apply over primary dressing as directed. Woven Gauze Sponge, Non-Sterile 4x4 in Discharge Instruction: can use gauze between toes (if no desired) Zetuvit Plus 4x8 in Discharge Instruction: Apply over primary dressing as directed. Secured  With Compression Wrap Urgo K2 Lite, (equivalent to a 3 layer) two layer compression system, regular Discharge Instruction: Apply Urgo K2 Lite as directed (alternative to 3 layer compression). Compression Stockings Add-Ons Electronic Signature(s) Signed: 02/05/2023 3:55:41 PM By: [name] Entered By: [name] on 02/05/2023 14:49:05 -------------------------------------------------------------------------------- Wound Assessment Details Patient Name: Date of Service: [name] RO N 02/05/2023 2:15 PM Medical Record Number: [id number] Patient Account Number: [id number] Date of Birth/Sex: Treating RN: [birth year] (82 y.o. [name] Primary Care Laporchia Nakajima: [name], [facility] Other Clinician: Referring Janai Brannigan: Treating Laira Penninger/Extender: [name] SHO [name], [facility] Weeks in Treatment: 34 Wound Status Wound Number: 8 Primary Etiology: Pressure Ulcer Wound Location: Left, Lateral Foot Wound Status: Open Wounding Event: Pressure Injury Comorbid History: Anemia, Angina, Hypertension, Vasculitis, Osteoarthritis Date Acquired: 10/24/2022 Weeks Of Treatment: 14 Clustered Wound: No Photos Tang, Keishla ([id number]) [id number] Page 14 of 17 Wound Measurements Length: (cm) 0.5 Width: (cm) 0.7 Depth: (cm) 0.2 Area: (cm) 0.275 Volume: (cm) 0.055 % Reduction in Area: -40.3% % Reduction in Volume: -41% Epithelialization: Small (1-33%) Tunneling: No Undermining: No Wound Description Classification: Category/Stage IV Wound Margin: Distinct, outline attached Exudate Amount: Medium Exudate Type: Serosanguineous Exudate Color: red, brown Foul Odor After Cleansing: No Slough/Fibrino Yes Wound Bed Granulation Amount: None Present (0%) Exposed Structure Necrotic Amount: Large (67-100%) Fascia Exposed: No Necrotic Quality: Adherent Slough Fat Layer (Subcutaneous Tissue) Exposed: Yes Tendon Exposed: No Muscle Exposed:  No Joint Exposed: No Bone Exposed: No Periwound Skin Texture Texture Color No Abnormalities Noted: Yes No Abnormalities Noted: Yes Moisture Temperature / Pain No Abnormalities Noted: Yes Temperature: No Abnormality Tenderness on Palpation: Yes Treatment Notes Wound #8 (Foot) Wound Laterality: Left, Lateral Cleanser Soap and Water Discharge Instruction: May shower and wash wound with dial antibacterial soap and water prior to dressing change. Wound Cleanser Discharge Instruction: Cleanse the wound with wound cleanser prior to applying a clean dressing using gauze sponges, not tissue or cotton balls. Peri-Wound Care Sween Lotion (Moisturizing lotion) Discharge Instruction: Apply moisturizing lotion as directed Topical Gentamicin Discharge Instruction: As directed by physician Mupirocin Ointment Discharge Instruction: Apply Mupirocin (Bactroban) as instructed Ketoconazole Cream 2% Discharge Instruction: Apply Ketoconazole as directed zinc Primary Dressing Maxorb Extra Ag+ Alginate Dressing, 2x2 (in/in) Discharge Instruction: Apply to wound bed as instructed Secondary Dressing Drawtex 4x4 in [place], Gera ([id number]) [id number] Page 15 of 17 Discharge Instruction: Apply over primary dressing as directed. Woven Gauze Sponge, Non-Sterile 4x4 in Discharge Instruction: can use gauze between toes (if no desired) Zetuvit Plus 4x8 in Discharge Instruction: Apply over primary dressing as directed. Secured With Compression Wrap Urgo K2 Lite, (equivalent to a 3 layer) two layer compression system, regular Discharge Instruction: Apply Urgo K2 Lite as directed (alternative to 3 layer compression). Compression Stockings Add-Ons Electronic Signature(s) Signed: 02/05/2023 3:55:41 PM By: [name] Entered By: [name] on 02/05/2023 14:49:31 -------------------------------------------------------------------------------- Wound Assessment  Details Patient Name: Date of Service: Kathryn Hammond [facility] RO N 02/05/2023 2:15 PM Medical Record  Number: [id number] Patient Account Number: [id number] Date of Birth/Sex: Treating RN: Feb 09, 1941 (82 y.o. [name] Primary Care Tashianna Broome: [name], [facility] Other Clinician: Referring Carlinda Ohlson: Treating Cathryn Gallery/Extender: [name] SHO [name], [facility] Weeks in Treatment: 34 Wound Status Wound Number: 9 Primary Etiology: Auto-immune Wound Location: Left, Lateral T [place] Wound Status: Open Wounding Event: Gradually Appeared Comorbid History: Anemia, Angina, Hypertension, Vasculitis, Osteoarthritis Date Acquired: 11/01/2022 Weeks Of Treatment: 13 Clustered Wound: No Photos Wound Measurements Length: (cm) 0.1 Width: (cm) 0.3 Depth: (cm) 0.1 Area: (cm) 0.024 Volume: (cm) 0.002 % Reduction in Area: 96.5% % Reduction in Volume: 97.1% Epithelialization: Large (67-100%) Tunneling: No Undermining: No Wound Description Classification: Full Thickness With Exposed Support Structures Wound Margin: Flat and Intact Exudate Amount: None Present Foul Odor After Cleansing: No Slough/Fibrino No Wound Bed Granulation Amount: None Present (0%) Exposed Structure Creppel, Betzaira ([id number]) [id number] Page 16 of 17 Necrotic Amount: Large (67-100%) Fascia Exposed: No Necrotic Quality: Eschar Fat Layer (Subcutaneous Tissue) Exposed: Yes Tendon Exposed: No Muscle Exposed: No Joint Exposed: No Bone Exposed: Yes Periwound Skin Texture Texture Color No Abnormalities Noted: Yes No Abnormalities Noted: Yes Moisture Temperature / Pain No Abnormalities Noted: Yes Temperature: No Abnormality Tenderness on Palpation: Yes Treatment Notes Wound #9 (Toe Great) Wound Laterality: Left, Lateral Cleanser Soap and Water Discharge Instruction: May shower and wash wound with dial antibacterial soap and water prior to dressing change. Wound Cleanser Discharge  Instruction: Cleanse the wound with wound cleanser prior to applying a clean dressing using gauze sponges, not tissue or cotton balls. Peri-Wound Care Sween Lotion (Moisturizing lotion) Discharge Instruction: Apply moisturizing lotion as directed Topical Gentamicin Discharge Instruction: As directed by physician Mupirocin Ointment Discharge Instruction: Apply Mupirocin (Bactroban) as instructed Ketoconazole Cream 2% Discharge Instruction: Apply Ketoconazole as directed zinc Primary Dressing Maxorb Extra Ag+ Alginate Dressing, 2x2 (in/in) Discharge Instruction: Apply to wound bed as instructed Secondary Dressing Drawtex 4x4 in Discharge Instruction: Apply over primary dressing as directed. Woven Gauze Sponge, Non-Sterile 4x4 in Discharge Instruction: can use gauze between toes (if no desired) Zetuvit Plus 4x8 in Discharge Instruction: Apply over primary dressing as directed. Secured With Compression Wrap Urgo K2 Lite, (equivalent to a 3 layer) two layer compression system, regular Discharge Instruction: Apply Urgo K2 Lite as directed (alternative to 3 layer compression). Compression Stockings Add-Ons Electronic Signature(s) Signed: 02/05/2023 3:55:41 PM By: [name] Entered By: [name] on 02/05/2023 14:49:55 Vitals Details -------------------------------------------------------------------------------- Marsh Dolly ([id number]) [id number] Page 17 of 17 Patient Name: Date of Service: [name] 02/05/2023 2:15 PM Medical Record Number: [id number] Patient Account Number: [id number] Date of Birth/Sex: Treating RN: March 25, 1941 (82 y.o. [name] Primary Care Ruffus Kamaka: [name], [facility] Other Clinician: Referring Tadd Holtmeyer: Treating Raseel Jans/Extender: [name] SHO [name], [facility] Weeks in Treatment: 54 Vital Signs Time Taken: 14:27 Temperature (F): 98.1 Height (in): 67 Pulse (bpm): 99 Weight (lbs):  153 Respiratory Rate (breaths/min): 16 Body Mass Index (BMI): 24 Blood Pressure (mmHg): 117/72 Reference Range: 80 - 120 mg / dl Electronic Signature(s) Signed: 02/05/2023 3:55:41 PM By: [name] Entered By: [name] on 02/05/2023 14:28:13

## 2023-02-12 ENCOUNTER — Encounter (HOSPITAL_BASED_OUTPATIENT_CLINIC_OR_DEPARTMENT_OTHER): Payer: Medicare Other | Admitting: General Surgery

## 2023-02-12 DIAGNOSIS — L97522 Non-pressure chronic ulcer of other part of left foot with fat layer exposed: Secondary | ICD-10-CM | POA: Diagnosis not present

## 2023-02-12 NOTE — Progress Notes (Signed)
Kathryn, Joseph ([id number]) [id number] Page 1 of 15 Visit Report for 02/12/2023 Arrival Information Details Patient Name: Date of Service: [name] RO N 02/12/2023 9:15 A M Medical Record Number: [id number] Patient Account Number: [id number] Date of Birth/Sex: Treating RN: [birth year] (82 y.o. F) Primary Care Arisha Gervais: [name], [place] Other Clinician: Referring Hamna Asa: Treating Myleen Brailsford/Extender: [name], [facility] Weeks in Treatment: 35 Visit Information History Since Last Visit Added or deleted any medications: No Patient Arrived: Wheel Chair Any new allergies or adverse reactions: No Arrival Time: 09:21 Had a fall or experienced change in No Accompanied By: aide activities of daily living that may affect Transfer Assistance: None risk of falls: Patient Identification Verified: Yes Signs or symptoms of abuse/neglect since last visito No Secondary Verification Process Completed: Yes Hospitalized since last visit: No Patient Requires Transmission-Based Precautions: No Implantable device outside of the clinic excluding No Patient Has Alerts: Yes cellular tissue based products placed in the center Patient Alerts: ABIs: R:0.82 L:0.84 7/24 since last visit: Pain Present Now: No Electronic Signature(s) Signed: 02/12/2023 10:30:24 AM By: [name] Entered By: [name] on 02/12/2023 06:22:20 -------------------------------------------------------------------------------- Encounter Discharge Information Details Patient Name: Date of Service: [name], [name] RO N 02/12/2023 9:15 A M Medical Record Number: [id number] Patient Account Number: [id number] Date of Birth/Sex: Treating RN: 12-Sep-1940 (82 y.o. Fredderick Joseph Primary Care Forbes Loll: [name], [facility] Other Clinician: Referring Anitta Tenny: Treating Cornie Mccomber/Extender: [name] SHO [name], [facility] Weeks in Treatment: 26 Encounter Discharge Information Items Post Procedure  Vitals Discharge Condition: Stable Temperature (F): 97.7 Ambulatory Status: Wheelchair Pulse (bpm): 92 Discharge Destination: Skilled Nursing Facility Respiratory Rate (breaths/min): 18 Telephoned: No Blood Pressure (mmHg): 102/67 Orders Sent: Yes Transportation: Private Auto Accompanied By: caregiver Schedule Follow-up Appointment: Yes Clinical Summary of Care: Patient Declined Electronic Signature(s) Signed: 02/12/2023 3:45:49 PM By: [name] Entered By: [name] on 02/12/2023 07:40:18 Vanderburg, Lulla ([id number]) [id number] Page 2 of 15 -------------------------------------------------------------------------------- Lower Extremity Assessment Details Patient Name: Date of Service: [name] 02/12/2023 9:15 A M Medical Record Number: [id number] Patient Account Number: [id number] Date of Birth/Sex: Treating RN: 28-Jan-1941 (82 y.o. Fredderick Joseph Primary Care Lexxus Underhill: [name], [facility] Other Clinician: Referring Hanna Aultman: Treating Donie Lemelin/Extender: [name] SHO [name], [facility] Weeks in Treatment: 35 Edema Assessment Assessed: [Left: No] [Right: No] Edema: [Left: Ye] [Right: s] Calf Left: Right: Point of Measurement: From Medial Instep 29.8 cm 27.5 cm Ankle Left: Right: Point of Measurement: From Medial Instep 20.5 cm 19 cm Vascular Assessment Pulses: Dorsalis Pedis Palpable: [Left:Yes] Extremity colors, hair growth, and conditions: Extremity Color: [Left:Hyperpigmented] Hair Growth on Extremity: [Left:No] [Right:No] Temperature of Extremity: [Left:Cool] [Right:Cool] Capillary Refill: [Left:> 3 seconds] Dependent Rubor: [Left:Yes No] [Right:Yes No] Electronic Signature(s) Signed: 02/12/2023 3:45:49 PM By: [name] Entered By: [name] on 02/12/2023 06:31:43 -------------------------------------------------------------------------------- Multi Wound Chart Details Patient Name: Date  of Service: [name], [facility] RO N 02/12/2023 9:15 A M Medical Record Number: [id number] Patient Account Number: [id number] Date of Birth/Sex: Treating RN: 1941-01-15 (82 y.o. F) Primary Care Marcus Schwandt: [name], [facility] Other Clinician: Referring Aneudy Champlain: Treating Billyjoe Go/Extender: [name], [facility] Weeks in Treatment: 35 Vital Signs Height(in): 67 Pulse(bpm): 92 Weight(lbs): 153 Blood Pressure(mmHg): 102/67 Body Mass Index(BMI): 24 Temperature(F): 97.7 Respiratory Rate(breaths/min): 18 [10:Photos:] [id number] Page 3 of 15] Left T Second oe Left, Plantar T Third oe Left T Fourth oe Wound Location: Gradually Appeared Gradually Appeared Gradually Appeared Wounding Event: Lymphedema Lymphedema Atypical Primary Etiology: Anemia, Angina, Hypertension, Anemia, Angina, Hypertension,  Anemia, Angina, Hypertension, Comorbid History: Vasculitis, Osteoarthritis Vasculitis, Osteoarthritis Vasculitis, Osteoarthritis 09/05/2022 01/30/2023 02/05/2023 Date Acquired: 9 1 1  Weeks of Treatment: Open Open Open Wound Status: No No No Wound Recurrence: 0.2x0.4x0.1 0.1x0.1x0.1 0.2x0.4x0.1 Measurements L x W x D (cm) 0.063 0.008 0.063 A (cm) : rea 0.006 0.001 0.006 Volume (cm) : 67.90% 97.50% 73.30% % Reduction in A rea: 70.00% 96.80% 75.00% % Reduction in Volume: Full Thickness With Exposed Support 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 Full Thickness With Exposed Support Classification: Structures Support Structures Structures Medium Medium Medium Exudate A mount: Serosanguineous Serosanguineous Serosanguineous Exudate Type: red, brown red, brown red, brown Exudate Color: Distinct, outline attached Distinct, outline attached Distinct, outline attached Wound Margin: Small (1-33%) None Present (0%) Small (1-33%) Granulation A mount: Pink N/A Pink Granulation Quality: Large (67-100%) Large (67-100%) Large (67-100%) Necrotic A mount: Fat Layer  (Subcutaneous Tissue): Yes Fat Layer (Subcutaneous Tissue): Yes Fat Layer (Subcutaneous Tissue): Yes Exposed Structures: Fascia: No Fascia: No Bone: Yes Tendon: No Tendon: No Fascia: No Muscle: No Muscle: No Tendon: No Joint: No Joint: No Muscle: No Bone: No Bone: No Joint: No Small (1-33%) Large (67-100%) Medium (34-66%) Epithelialization: Debridement - Selective/Open Wound Debridement - Selective/Open Wound Debridement - Selective/Open Wound Debridement: Pre-procedure Verification/Time Out 09:50 09:50 09:50 Taken: Lidocaine 4% Topical Solution Lidocaine 4% Topical Solution Lidocaine 4% Topical Solution Pain Control: Principal Financial Tissue Debrided: Non-Viable Tissue Non-Viable Tissue Non-Viable Tissue Level: 0.06 0.01 0.06 Debridement A (sq cm): rea Curette Curette Curette Instrument: Minimum Minimum Minimum Bleeding: Pressure Pressure Pressure Hemostasis A chieved: Procedure was tolerated well Procedure was tolerated well Procedure was tolerated well Debridement Treatment Response: 0.2x0.4x0.1 0.1x0.1x0.1 0.2x0.4x0.1 Post Debridement Measurements L x W x D (cm) 0.006 0.001 0.006 Post Debridement Volume: (cm) N/A N/A N/A Post Debridement Stage: No Abnormalities Noted Excoriation: Yes No Abnormalities Noted Periwound Skin Texture: Maceration: Yes Maceration: Yes Maceration: Yes Periwound Skin Moisture: No Abnormalities Noted No Abnormalities Noted No Abnormalities Noted Periwound Skin Color: No Abnormality No Abnormality No Abnormality Temperature: Yes Yes Yes Tenderness on Palpation: Debridement Debridement Debridement Procedures Performed: Wound Number: [id number]  Photos: No Photos No Photos Left, Dorsal Foot Left, Lateral Foot Left, Lateral T Great oe Wound Location: Gradually Appeared Pressure Injury Gradually Appeared 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, Angina, Hypertension, Anemia, Angina,  Hypertension, Anemia, Angina, Hypertension, Comorbid History: Vasculitis, Osteoarthritis Vasculitis, Osteoarthritis Vasculitis, Osteoarthritis 09/05/2022 10/24/2022 11/01/2022 Date Acquired: 22 15 14  Weeks of Treatment: Open Open Open Wound Status: No No No Wound Recurrence: 2.5x2.4x0.1 0.5x0.7x0.1 0.3x0.3x0.1 Measurements L x W x D (cm) 4.712 0.275 0.071 A (cm) : rea 0.471 0.027 0.007 Volume (cm) : 87.90% -40.30% 89.70% % Reduction in Area: 87.90% 30.80% 89.90% % Reduction in Volume: Meals, Ondria ([id number]) [id number] Page 4 of 15 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 Category/Stage IV Full Thickness With Exposed Support Classification: Support Structures Structures Medium Medium Medium Exudate A mount: Serosanguineous Serosanguineous Serous Exudate Type: red, brown red, brown amber Exudate Color: Indistinct, nonvisible Distinct, outline attached Flat and Intact Wound Margin: Large (67-100%) None Present (0%) Small (1-33%) Granulation A mount: Pink N/A Pink Granulation Quality: Small (1-33%) Large (67-100%) Large (67-100%) Necrotic A mount: Fat Layer (Subcutaneous Tissue): Yes Fat Layer (Subcutaneous Tissue): Yes Fat Layer (Subcutaneous Tissue): Yes Exposed Structures: Fascia: No Fascia: No Bone: Yes Tendon: No Tendon: No Fascia: No Muscle: No Muscle: No Tendon: No Joint: No Joint: No Muscle: No Bone: No Bone: No Joint: No Medium (34-66%) Small (1-33%) Medium (34-66%) Epithelialization: Debridement -  Selective/Open Wound Debridement - Selective/Open Wound Debridement - Selective/Open Wound Debridement: Pre-procedure Verification/Time Out 09:50 09:50 09:50 Taken: Lidocaine 4% Topical Solution Lidocaine 4% Topical Solution Lidocaine 4% Topical Solution Pain Control: Principal Financial Tissue Debrided: Skin/Epidermis Skin/Epidermis Non-Viable Tissue Level: 4.71 0.27 0.07 Debridement A (sq cm): rea Curette Curette  Curette Instrument: Minimum Minimum Minimum Bleeding: Pressure Pressure Pressure Hemostasis A chieved: Procedure was tolerated well Procedure was tolerated well Procedure was tolerated well Debridement Treatment Response: 2.5x2.4x0.1 0.5x0.7x0.1 0.3x0.3x0.1 Post Debridement Measurements L x W x D (cm) 0.471 0.027 0.007 Post Debridement Volume: (cm) N/A Category/Stage IV N/A Post Debridement Stage: Excoriation: Yes No Abnormalities Noted No Abnormalities Noted Periwound Skin Texture: Maceration: Yes Dry/Scaly: Yes No Abnormalities Noted Periwound Skin Moisture: Rubor: Yes No Abnormalities Noted Rubor: Yes Periwound Skin Color: No Abnormality No Abnormality No Abnormality Temperature: Yes Yes Yes Tenderness on Palpation: Debridement Debridement Debridement Procedures Performed: Treatment Notes Electronic Signature(s) Signed: 02/12/2023 9:56:44 AM By: [name] MD FACS Entered By: [name] on 02/12/2023 06:56:44 -------------------------------------------------------------------------------- Multi-Disciplinary Care Plan Details Patient Name: Date of Service: [name], [facility] RO N 02/12/2023 9:15 A M Medical Record Number: [id number] Patient Account Number: [id number] Date of Birth/Sex: Treating RN: [birth year] (82 y.o. Fredderick Joseph Primary Care Megean Fabio: [name], [facility] Other Clinician: Referring Nakyiah Kuck: Treating Honesti Seaberg/Extender: [name], [facility] Weeks in Treatment: 24 Multidisciplinary Care Plan reviewed with physician Active Inactive Necrotic Tissue Nursing Diagnoses: Impaired tissue integrity related to necrotic/devitalized tissue Knowledge deficit related to management of necrotic/devitalized tissue Goals: Necrotic/devitalized tissue will be minimized in the wound bed Date Initiated: 06/12/2022 Target Resolution Date: 02/22/2023 Goal Status: Active Patient/caregiver will verbalize understanding of reason and process for  debridement of necrotic tissue Date Initiated: 06/12/2022 Target Resolution Date: 02/22/2023 RADIAH, DONN ([id number]) [phone].pdf Page 5 of 15 Goal Status: Active Interventions: Assess patient pain level pre-, during and post procedure and prior to discharge Provide education on necrotic tissue and debridement process Treatment Activities: Apply topical anesthetic as ordered : 06/12/2022 Notes: Wound/Skin Impairment Nursing Diagnoses: Impaired tissue integrity Knowledge deficit related to ulceration/compromised skin integrity Goals: Patient/caregiver will verbalize understanding of skin care regimen Date Initiated: 06/12/2022 Target Resolution Date: 02/22/2023 Goal Status: Active Interventions: Assess ulceration(s) every visit Treatment Activities: Skin care regimen initiated : 06/12/2022 Topical wound management initiated : 06/12/2022 Notes: Electronic Signature(s) Signed: 02/12/2023 3:45:49 PM By: [name] Entered By: [name] on 02/12/2023 06:49:24 -------------------------------------------------------------------------------- Pain Assessment Details Patient Name: Date of Service: [name] [facility] RO N 02/12/2023 9:15 A M Medical Record Number: [id number] Patient Account Number: [id number] Date of Birth/Sex: Treating RN: 1940/08/29 (82 y.o. F) Primary Care Vertis Scheib: [name], [facility] Other Clinician: Referring Suhaas Agena: Treating Sadonna Kotara/Extender: [name], [facility] Weeks in Treatment: 70 Active Problems Location of Pain Severity and Description of Pain Patient Has Paino Yes Site Locations Rate the pain. Current Pain Level: 5 Worst Pain Level: 10 Least Pain Level: 0 Tolerable Pain Level: 3 Character of Pain Mccard, Valera ([id number]) [id number] Page 6 of 15 Describe the Pain: Throbbing Pain Management and Medication Current Pain Management: Electronic Signature(s) Signed:  02/12/2023 10:30:24 AM By: [name] Entered By: [name] on 02/12/2023 06:23:34 -------------------------------------------------------------------------------- Patient/Caregiver Education Details Patient Name: Date of Service: Kathryn [name], [facility] RO N 10/22/2024andnbsp9:15 A M Medical Record Number: [id number] Patient Account Number: [id number] Date of Birth/Gender: Treating RN: 01/21/1941 (82 y.o. Fredderick Joseph Primary Care Physician: [name], [place] Other Clinician: Referring Physician: Treating Physician/Extender: [name] SHO KES, [facility] Weeks in Treatment:  35 Education Assessment Education Provided To: Patient Education Topics Provided Wound/Skin Impairment: Methods: Explain/Verbal Responses: Reinforcements needed, State content correctly Electronic Signature(s) Signed: 02/12/2023 3:45:49 PM By: [name] Entered By: [name] on 02/12/2023 06:49:37 -------------------------------------------------------------------------------- Wound Assessment Details Patient Name: Date of Service: [name] [facility] RO N 02/12/2023 9:15 A M Medical Record Number: [id number] Patient Account Number: [id number] Date of Birth/Sex: Treating RN: 09-27-1940 (82 y.o. Fredderick Joseph Primary Care Alesa Echevarria: [name], [facility] Other Clinician: Referring Elishua Radford: Treating Shelsy Seng/Extender: [name] SHO KES, [facility] [address] Weeks in Treatment: 35 Wound Status Wound Number: 10 Primary Etiology: Lymphedema Wound Location: Left T Second oe Wound Status: Open Wounding Event: Gradually Appeared Comorbid History: Anemia, Angina, Hypertension, Vasculitis, Osteoarthritis Date Acquired: 09/05/2022 Weeks Of Treatment: 9 Clustered Wound: No Photos Kozub, Nathaly ([id number]) [id number] Page 7 of 15 Wound Measurements Length: (cm) 0.2 Width: (cm) 0.4 Depth: (cm) 0.1 Area: (cm) 0.063 Volume: (cm) 0.006 % Reduction in Area: 67.9% % Reduction  in Volume: 70% Epithelialization: Small (1-33%) Tunneling: No Undermining: No Wound Description Classification: Full Thickness With Exposed Support Structures Wound Margin: Distinct, outline attached Exudate Amount: Medium Exudate Type: Serosanguineous Exudate Color: red, brown Foul Odor After Cleansing: No Slough/Fibrino Yes Wound Bed Granulation Amount: Small (1-33%) Exposed Structure Granulation Quality: Pink Fascia Exposed: No Necrotic Amount: Large (67-100%) Fat Layer (Subcutaneous Tissue) Exposed: Yes Necrotic Quality: Adherent Slough Tendon Exposed: No Muscle Exposed: No Joint Exposed: No Bone Exposed: No Periwound Skin Texture Texture Color No Abnormalities Noted: Yes No Abnormalities Noted: Yes Moisture Temperature / Pain No Abnormalities Noted: Yes Temperature: No Abnormality Tenderness on Palpation: Yes Treatment Notes Wound #10 (Toe Second) Wound Laterality: Left Cleanser Soap and Water Discharge Instruction: May shower and wash wound with dial antibacterial soap and water prior to dressing change. Wound Cleanser Discharge Instruction: Cleanse the wound with wound cleanser prior to applying a clean dressing using gauze sponges, not tissue or cotton balls. Peri-Wound Care Sween Lotion (Moisturizing lotion) Discharge Instruction: Apply moisturizing lotion as directed Topical Betadine Discharge Instruction: Paint on wounds. Primary Dressing Secondary Dressing Woven Gauze Sponge, Non-Sterile 4x4 in Discharge Instruction: can use gauze between toes (if no desired) Secured With Elastic Bandage 4 inch (ACE bandage) Discharge Instruction: Secure with ACE bandage as directed. Kerlix Roll Sterile, 4.5x3.1 (in/yd) Discharge Instruction: Secure with Kerlix as directed. ROLLINS, STRZALKA ([id number]) [id number] Page 8 of 15 Compression Wrap tubigrip Compression Stockings Add-Ons Electronic Signature(s) Signed: 02/12/2023 10:30:24 AM  By: [name] Signed: 02/12/2023 3:45:49 PM By: [name] Entered By: [name] on 02/12/2023 06:37:50 -------------------------------------------------------------------------------- Wound Assessment Details Patient Name: Date of Service: [name] [facility] RO N 02/12/2023 9:15 A M Medical Record Number: [id number] Patient Account Number: [id number] Date of Birth/Sex: Treating RN: [birth year] (82 y.o. Fredderick Joseph Primary Care Laverle Pillard: [name], [facility] Other Clinician: Referring Genoa Freyre: Treating Kyleigha Markert/Extender: [name] SHO [name], [facility] Weeks in Treatment: 35 Wound Status Wound Number: 12 Primary Etiology: Lymphedema Wound Location: Left, Plantar T Third oe Wound Status: Open Wounding Event: Gradually Appeared Comorbid History: Anemia, Angina, Hypertension, Vasculitis, Osteoarthritis Date Acquired: 01/30/2023 Weeks Of Treatment: 1 Clustered Wound: No Photos Wound Measurements Length: (cm) 0.1 Width: (cm) 0.1 Depth: (cm) 0.1 Area: (cm) 0.008 Volume: (cm) 0.001 % Reduction in Area: 97.5% % Reduction in Volume: 96.8% Epithelialization: Large (67-100%) Tunneling: No Undermining: No Wound Description Classification: 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 Support Wound Margin: Distinct, outline attached Exudate Amount: Medium Exudate Type: Serosanguineous Exudate Color: red, brown Structures Foul Odor After Cleansing: No Slough/Fibrino Yes Wound Bed  Granulation Amount: None Present (0%) Exposed Structure Necrotic Amount: Large (67-100%) Fascia Exposed: No Necrotic Quality: Adherent Slough Fat Layer (Subcutaneous Tissue) Exposed: Yes Tendon Exposed: No Muscle Exposed: No Joint Exposed: No Bone Exposed: No Periwound Skin Texture Darius, Adjoa ([id number]) [id number] Page 9 of 15 Texture Color No Abnormalities Noted: No No Abnormalities Noted: Yes Excoriation: Yes Temperature / Pain Temperature: No  Abnormality Moisture No Abnormalities Noted: No Tenderness on Palpation: Yes Maceration: Yes Treatment Notes Wound #12 (Toe Third) Wound Laterality: Plantar, Left Cleanser Soap and Water Discharge Instruction: May shower and wash wound with dial antibacterial soap and water prior to dressing change. Wound Cleanser Discharge Instruction: Cleanse the wound with wound cleanser prior to applying a clean dressing using gauze sponges, not tissue or cotton balls. Peri-Wound Care Sween Lotion (Moisturizing lotion) Discharge Instruction: Apply moisturizing lotion as directed Topical Betadine Discharge Instruction: Paint on wounds. Primary Dressing Secondary Dressing Woven Gauze Sponge, Non-Sterile 4x4 in Discharge Instruction: can use gauze between toes (if no desired) Secured With Elastic Bandage 4 inch (ACE bandage) Discharge Instruction: Secure with ACE bandage as directed. Kerlix Roll Sterile, 4.5x3.1 (in/yd) Discharge Instruction: Secure with Kerlix as directed. Compression Wrap tubigrip Compression Stockings Add-Ons Electronic Signature(s) Signed: 02/12/2023 10:30:24 AM By: [name] Signed: 02/12/2023 3:45:49 PM By: [name] Entered By: [name] on 02/12/2023 06:38:19 -------------------------------------------------------------------------------- Wound Assessment Details Patient Name: Date of Service: [name] [facility] RO N 02/12/2023 9:15 A M Medical Record Number: [id number] Patient Account Number: [id number] Date of Birth/Sex: Treating RN: 1940-12-12 (82 y.o. Fredderick Joseph Primary Care Greig Altergott: [name], [facility] Other Clinician: Referring Koal Eslinger: Treating Latania Bascomb/Extender: [name] SHO KES, [facility] Weeks in Treatment: 35 Wound Status Wound Number: 13 Primary Etiology: Atypical Wound Location: Left T Fourth oe Wound Status: Open Wounding Event: Gradually Appeared Comorbid History: Anemia, Angina, Hypertension, Vasculitis,  Osteoarthritis Date Acquired: 02/05/2023 Weeks Of Treatment: 1 Clustered Wound: No Fukuda, Myeshia ([id number]) [id number] Page 10 of 15 Photos Wound Measurements Length: (cm) 0.2 Width: (cm) 0.4 Depth: (cm) 0.1 Area: (cm) 0.063 Volume: (cm) 0.006 % Reduction in Area: 73.3% % Reduction in Volume: 75% Epithelialization: Medium (34-66%) Tunneling: No Undermining: No Wound Description Classification: Full Thickness With Exposed Support Structures Wound Margin: Distinct, outline attached Exudate Amount: Medium Exudate Type: Serosanguineous Exudate Color: red, brown Foul Odor After Cleansing: No Slough/Fibrino Yes Wound Bed Granulation Amount: Small (1-33%) Exposed Structure Granulation Quality: Pink Fascia Exposed: No Necrotic Amount: Large (67-100%) Fat Layer (Subcutaneous Tissue) Exposed: Yes Necrotic Quality: Adherent Slough Tendon Exposed: No Muscle Exposed: No Joint Exposed: No Bone Exposed: Yes Periwound Skin Texture Texture Color No Abnormalities Noted: Yes No Abnormalities Noted: Yes Moisture Temperature / Pain No Abnormalities Noted: No Temperature: No Abnormality Maceration: Yes Tenderness on Palpation: Yes Treatment Notes Wound #13 (Toe Fourth) Wound Laterality: Left Cleanser Soap and Water Discharge Instruction: May shower and wash wound with dial antibacterial soap and water prior to dressing change. Wound Cleanser Discharge Instruction: Cleanse the wound with wound cleanser prior to applying a clean dressing using gauze sponges, not tissue or cotton balls. Peri-Wound Care Sween Lotion (Moisturizing lotion) Discharge Instruction: Apply moisturizing lotion as directed Topical Betadine Discharge Instruction: Paint on wounds. Primary Dressing Secondary Dressing Woven Gauze Sponge, Non-Sterile 4x4 in Discharge Instruction: can use gauze between toes (if no desired) Secured With Elastic Bandage 4 inch (ACE  bandage) Discharge Instruction: Secure with ACE bandage as directed. Kerlix Roll Sterile, 4.5x3.1 (in/yd) Scow, Tamanna ([id number]) [id number] Page 11 of 15 Discharge Instruction: Secure with Kerlix as  directed. Compression Wrap tubigrip Compression Stockings Add-Ons Electronic Signature(s) Signed: 02/12/2023 10:30:24 AM By: [name] Signed: 02/12/2023 3:45:49 PM By: [name] Entered By: [name] on 02/12/2023 06:38:42 -------------------------------------------------------------------------------- Wound Assessment Details Patient Name: Date of Service: [name] [facility] RO N 02/12/2023 9:15 A M Medical Record Number: [id number] Patient Account Number: [id number] Date of Birth/Sex: Treating RN: 10-Apr-1941 (82 y.o. Fredderick Joseph Primary Care Katura Eatherly: [name], [facility] Other Clinician: Referring Catherene Kaleta: Treating Karleigh Bunte/Extender: [name] SHO [name], [facility] Weeks in Treatment: 35 Wound Status Wound Number: 6 Primary Etiology: Lymphedema Wound Location: Left, Dorsal Foot Wound Status: Open Wounding Event: Gradually Appeared Comorbid History: Anemia, Angina, Hypertension, Vasculitis, Osteoarthritis Date Acquired: 09/05/2022 Weeks Of Treatment: 22 Clustered Wound: No Photos Wound Measurements Length: (cm) 2.5 Width: (cm) 2.4 Depth: (cm) 0.1 Area: (cm) 4.712 Volume: (cm) 0.471 % Reduction in Area: 87.9% % Reduction in Volume: 87.9% Epithelialization: Medium (34-66%) Tunneling: No Undermining: No Wound Description Classification: 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 Support Wound Margin: Indistinct, nonvisible Exudate Amount: Medium Exudate Type: Serosanguineous Exudate Color: red, brown Structures Foul Odor After Cleansing: No Slough/Fibrino Yes Wound Bed Granulation Amount: Large (67-100%) Exposed Structure Granulation Quality: Pink Fascia Exposed: No Necrotic Amount: Small (1-33%) Fat Layer (Subcutaneous Tissue)  Exposed: Yes Necrotic Quality: Adherent Slough Tendon Exposed: No Muscle Exposed: No Joint Exposed: No Bone Exposed: No Carreira, Londin ([id number]) [id number] Page 12 of 15 Periwound Skin Texture Texture Color No Abnormalities Noted: Yes No Abnormalities Noted: No Rubor: Yes Moisture No Abnormalities Noted: Yes Temperature / Pain Temperature: No Abnormality Tenderness on Palpation: Yes Treatment Notes Wound #6 (Foot) Wound Laterality: Dorsal, Left Cleanser Soap and Water Discharge Instruction: May shower and wash wound with dial antibacterial soap and water prior to dressing change. Wound Cleanser Discharge Instruction: Cleanse the wound with wound cleanser prior to applying a clean dressing using gauze sponges, not tissue or cotton balls. Peri-Wound Care Sween Lotion (Moisturizing lotion) Discharge Instruction: Apply moisturizing lotion as directed Topical Betadine Discharge Instruction: Paint on wounds. Primary Dressing Secondary Dressing Woven Gauze Sponge, Non-Sterile 4x4 in Discharge Instruction: can use gauze between toes (if no desired) Secured With Elastic Bandage 4 inch (ACE bandage) Discharge Instruction: Secure with ACE bandage as directed. Kerlix Roll Sterile, 4.5x3.1 (in/yd) Discharge Instruction: Secure with Kerlix as directed. Compression Wrap tubigrip Compression Stockings Add-Ons Electronic Signature(s) Signed: 02/12/2023 10:30:24 AM By: [name] Signed: 02/12/2023 3:45:49 PM By: [name] Entered By: [name] on 02/12/2023 06:39:06 -------------------------------------------------------------------------------- Wound Assessment Details Patient Name: Date of Service: [name] [facility] RO N 02/12/2023 9:15 A M Medical Record Number: [id number] Patient Account Number: [id number] Date of Birth/Sex: Treating RN: Mar 20, 1941 (82 y.o. Fredderick Joseph Primary Care Ryson Bacha: [name], [name] Other  Clinician: Referring Librada Castronovo: Treating Keosha Rossa/Extender: [name] SHO [name], [facility] Weeks in Treatment: 35 Wound Status Wound Number: 8 Primary Etiology: Pressure Ulcer Wound Location: Left, Lateral Foot Wound Status: Open Wounding Event: Pressure Injury Comorbid History: Anemia, Angina, Hypertension, Vasculitis, Osteoarthritis Date Acquired: 10/24/2022 Weeks Of Treatment: 15 Clustered Wound: No Cruickshank, Sharol ([id number]) [id number] Page 13 of 15 Wound Measurements Length: (cm) 0.5 Width: (cm) 0.7 Depth: (cm) 0.1 Area: (cm) 0.275 Volume: (cm) 0.027 % Reduction in Area: -40.3% % Reduction in Volume: 30.8% Epithelialization: Small (1-33%) Tunneling: No Undermining: No Wound Description Classification: Category/Stage IV Wound Margin: Distinct, outline attached Exudate Amount: Medium Exudate Type: Serosanguineous Exudate Color: red, brown Foul Odor After Cleansing: No Slough/Fibrino Yes Wound Bed Granulation Amount: None Present (0%) Exposed Structure Necrotic Amount: Large (67-100%) Fascia  Exposed: No Necrotic Quality: Adherent Slough Fat Layer (Subcutaneous Tissue) Exposed: Yes Tendon Exposed: No Muscle Exposed: No Joint Exposed: No Bone Exposed: No Periwound Skin Texture Texture Color No Abnormalities Noted: Yes No Abnormalities Noted: Yes Moisture Temperature / Pain No Abnormalities Noted: Yes Temperature: No Abnormality Tenderness on Palpation: Yes Treatment Notes Wound #8 (Foot) Wound Laterality: Left, Lateral Cleanser Soap and Water Discharge Instruction: May shower and wash wound with dial antibacterial soap and water prior to dressing change. Wound Cleanser Discharge Instruction: Cleanse the wound with wound cleanser prior to applying a clean dressing using gauze sponges, not tissue or cotton balls. Peri-Wound Care Sween Lotion (Moisturizing lotion) Discharge Instruction: Apply moisturizing lotion as  directed Topical Betadine Discharge Instruction: Paint on wounds. Primary Dressing Secondary Dressing Woven Gauze Sponge, Non-Sterile 4x4 in Discharge Instruction: can use gauze between toes (if no desired) Secured With Elastic Bandage 4 inch (ACE bandage) Discharge Instruction: Secure with ACE bandage as directed. Kerlix Roll Sterile, 4.5x3.1 (in/yd) Discharge Instruction: Secure with Kerlix as directed. Compression Wrap tubigrip Compression Stockings Add-Ons Electronic Signature(s) Signed: 02/12/2023 10:30:24 AM By: [name] Signed: 02/12/2023 3:45:49 PM By: [name] Entered By: [name] on 02/12/2023 06:39:36 Schemm, Jaydn ([id number]) [id number] Page 14 of 15 -------------------------------------------------------------------------------- Wound Assessment Details Patient Name: Date of Service: [name] 02/12/2023 9:15 A M Medical Record Number: [id number] Patient Account Number: [id number] Date of Birth/Sex: Treating RN: 1941/02/17 (82 y.o. Fredderick Joseph Primary Care Shion Bluestein: [name], [facility] Other Clinician: Referring Valori Hollenkamp: Treating Arlicia Paquette/Extender: [name] SHO [name], [facility] Weeks in Treatment: 35 Wound Status Wound Number: 9 Primary Etiology: Auto-immune Wound Location: Left, Lateral T Great oe Wound Status: Open Wounding Event: Gradually Appeared Comorbid History: Anemia, Angina, Hypertension, Vasculitis, Osteoarthritis Date Acquired: 11/01/2022 Weeks Of Treatment: 14 Clustered Wound: No Wound Measurements Length: (cm) 0.3 Width: (cm) 0.3 Depth: (cm) 0.1 Area: (cm) 0.071 Volume: (cm) 0.007 % Reduction in Area: 89.7% % Reduction in Volume: 89.9% Epithelialization: Medium (34-66%) Tunneling: No Undermining: No Wound Description Classification: Full Thickness With Exposed Support Structures Wound Margin: Flat and Intact Exudate Amount: Medium Exudate Type: Serous Exudate Color:  amber Foul Odor After Cleansing: No Slough/Fibrino Yes Wound Bed Granulation Amount: Small (1-33%) Exposed Structure Granulation Quality: Pink Fascia Exposed: No Necrotic Amount: Large (67-100%) Fat Layer (Subcutaneous Tissue) Exposed: Yes Necrotic Quality: Adherent Slough Tendon Exposed: No Muscle Exposed: No Joint Exposed: No Bone Exposed: Yes Periwound Skin Texture Texture Color No Abnormalities Noted: Yes No Abnormalities Noted: Yes Moisture Temperature / Pain No Abnormalities Noted: Yes Temperature: No Abnormality Tenderness on Palpation: Yes Treatment Notes Wound #9 (Toe Great) Wound Laterality: Left, Lateral Cleanser Soap and Water Discharge Instruction: May shower and wash wound with dial antibacterial soap and water prior to dressing change. Wound Cleanser Discharge Instruction: Cleanse the wound with wound cleanser prior to applying a clean dressing using gauze sponges, not tissue or cotton balls. Peri-Wound Care Sween Lotion (Moisturizing lotion) Discharge Instruction: Apply moisturizing lotion as directed Topical Betadine Discharge Instruction: Paint on wounds. LASHELL, ARDOLINO ([id number]) [id number] Page 15 of 15 Primary Dressing Secondary Dressing Woven Gauze Sponge, Non-Sterile 4x4 in Discharge Instruction: can use gauze between toes (if no desired) Secured With Elastic Bandage 4 inch (ACE bandage) Discharge Instruction: Secure with ACE bandage as directed. Kerlix Roll Sterile, 4.5x3.1 (in/yd) Discharge Instruction: Secure with Kerlix as directed. Compression Wrap tubigrip Compression Stockings Add-Ons Electronic Signature(s) Signed: 02/12/2023 3:45:49 PM By: [name] Entered By: [name] on 02/12/2023 06:36:40 -------------------------------------------------------------------------------- Vitals Details Patient Name:  Date of Service: [name] 02/12/2023 9:15 A M Medical Record Number:  [id number] Patient Account Number: [id number] Date of Birth/Sex: Treating RN: Nov 19, 1940 (82 y.o. F) Primary Care Atiana Levier: [name], [facility] Other Clinician: Referring Correne Lalani: Treating Jarelly Rinck/Extender: [name], [facility] Weeks in Treatment: 85 Vital Signs Time Taken: 09:22 Temperature (F): 97.7 Height (in): 67 Pulse (bpm): 92 Weight (lbs): 153 Respiratory Rate (breaths/min): 18 Body Mass Index (BMI): 24 Blood Pressure (mmHg): 102/67 Reference Range: 80 - 120 mg / dl Electronic Signature(s) Signed: 02/12/2023 10:30:24 AM By: [name] Entered By: [name] on 02/12/2023 06:22:47

## 2023-02-12 NOTE — Progress Notes (Addendum)
Joseph, Kathryn ([id number]) [id number] Page 1 of 19 Visit Report for 02/12/2023 Chief Complaint Document Details Patient Name: Date of Service: [name] RO N 02/12/2023 9:15 A M Medical Record Number: [id number] Patient Account Number: [id number] Date of Birth/Sex: Treating RN: [birth year] (82 y.o. F) Primary Care Provider: [name], [facility] Other Clinician: Referring Provider: Treating Provider/Extender: [name], [facility] Weeks in Treatment: 41 Information Obtained from: Patient Chief Complaint Patient seen for complaints of Non-Healing Wound. Electronic Signature(s) Signed: 02/12/2023 9:56:51 AM By: [name] MD FACS Entered By: [name] on 02/12/2023 09:56:51 -------------------------------------------------------------------------------- Debridement Details Patient Name: Date of Service: [name], [name] 02/12/2023 9:15 A M Medical Record Number: [id number] Patient Account Number: [id number] Date of Birth/Sex: Treating RN: [birth year] (82 y.o. [name] Primary Care Provider: [name], [facility] Other Clinician: Referring Provider: Treating Provider/Extender: [name], [facility] Weeks in Treatment: 35 Debridement Performed for Assessment: Wound #9 Left,Lateral T Great oe Performed By: Physician [name], MD The following information was scribed by: [name] The information was scribed for: [name] Debridement Type: Debridement Level of Consciousness (Pre-procedure): Awake and Alert Pre-procedure Verification/Time Out Yes - 09:50 Taken: Start Time: 09:50 Pain Control: Lidocaine 4% T opical Solution Percent of Wound Bed Debrided: 100% T Area Debrided (cm): otal 0.07 Tissue and other material debrided: Non-Viable, Slough, Slough Level: Non-Viable Tissue Debridement Description: Selective/Open Wound Instrument: Curette Bleeding: Minimum Hemostasis Achieved:  Pressure Response to Treatment: Procedure was tolerated well Level of Consciousness (Post- Awake and Alert procedure): Post Debridement Measurements of Total Wound Length: (cm) 0.3 Width: (cm) 0.3 Depth: (cm) 0.1 Volume: (cm) 0.007 Character of Wound/Ulcer Post Debridement: Improved Post Procedure Diagnosis [name] ([id number]) [id number] Page 2 of 19 Same as Pre-procedure Electronic Signature(s) Signed: 02/12/2023 10:55:53 AM By: [name] MD FACS Signed: 02/12/2023 3:45:49 PM By: [name] Entered By: [name] on 02/12/2023 09:51:09 -------------------------------------------------------------------------------- Debridement Details Patient Name: Date of Service: [name], [name] 02/12/2023 9:15 A M Medical Record Number: [id number] Patient Account Number: [id number] Date of Birth/Sex: Treating RN: 21-Mar-1941 (82 y.o. [name] Primary Care Provider: [name], [facility] Other Clinician: Referring Provider: Treating Provider/Extender: [name], [facility] Weeks in Treatment: 35 Debridement Performed for Assessment: Wound #10 Left T Second oe Performed By: Physician [name], MD Debridement Type: Debridement Level of Consciousness (Pre-procedure): Awake and Alert Pre-procedure Verification/Time Out Yes - 09:50 Taken: Start Time: 09:50 Pain Control: Lidocaine 4% T opical Solution Percent of Wound Bed Debrided: 100% T Area Debrided (cm): otal 0.06 Tissue and other material debrided: Non-Viable, Slough, Slough Level: Non-Viable Tissue Debridement Description: Selective/Open Wound Instrument: Curette Bleeding: Minimum Hemostasis Achieved: Pressure Response to Treatment: Procedure was tolerated well Level of Consciousness (Post- Awake and Alert procedure): Post Debridement Measurements of Total Wound Length: (cm) 0.2 Width: (cm) 0.4 Depth: (cm) 0.1 Volume: (cm) 0.006 Character of  Wound/Ulcer Post Debridement: Improved Post Procedure Diagnosis Same as Pre-procedure Electronic Signature(s) Signed: 02/12/2023 10:55:53 AM By: [name] MD FACS Signed: 02/12/2023 3:45:49 PM By: [name] Entered By: [name] on 02/12/2023 09:51:37 -------------------------------------------------------------------------------- Debridement Details Patient Name: Date of Service: [name], [name] 02/12/2023 9:15 A M Medical Record Number: [id number] Patient Account Number: [id number] Date of Birth/Sex: Treating RN: March 06, 1941 (82 y.o. [name] Primary Care Provider: [name], [facility] Other Clinician: Referring Provider: Treating Provider/Extender: [name], [facility] Weeks in Treatment: [address] [name] ([id number]) [id number] Page 3 of 19 Debridement Performed for  Assessment: Wound #13 Left T Fourth oe Performed By: Physician [name], MD The following information was scribed by: [name] The information was scribed for: [name] Debridement Type: Debridement Level of Consciousness (Pre-procedure): Awake and Alert Pre-procedure Verification/Time Out Yes - 09:50 Taken: Start Time: 09:50 Pain Control: Lidocaine 4% T opical Solution Percent of Wound Bed Debrided: 100% T Area Debrided (cm): otal 0.06 Tissue and other material debrided: Non-Viable, Slough, Slough Level: Non-Viable Tissue Debridement Description: Selective/Open Wound Instrument: Curette Bleeding: Minimum Hemostasis Achieved: Pressure Response to Treatment: Procedure was tolerated well Level of Consciousness (Post- Awake and Alert procedure): Post Debridement Measurements of Total Wound Length: (cm) 0.2 Width: (cm) 0.4 Depth: (cm) 0.1 Volume: (cm) 0.006 Character of Wound/Ulcer Post Debridement: Improved Post Procedure Diagnosis Same as Pre-procedure Electronic Signature(s) Signed: 02/12/2023  10:55:53 AM By: [name] MD FACS Signed: 02/12/2023 3:45:49 PM By: [name] Entered By: [name] on 02/12/2023 09:51:58 -------------------------------------------------------------------------------- Debridement Details Patient Name: Date of Service: [name], [facility] RO N 02/12/2023 9:15 A M Medical Record Number: [id number] Patient Account Number: [id number] Date of Birth/Sex: Treating RN: February 12, 1941 (82 y.o. [name] Primary Care Provider: [name], [facility] Other Clinician: Referring Provider: Treating Provider/Extender: [name], [facility] Weeks in Treatment: 35 Debridement Performed for Assessment: Wound #6 Left,Dorsal Foot Performed By: Physician [name], MD The following information was scribed by: [name] The information was scribed for: [name] Debridement Type: Debridement Level of Consciousness (Pre-procedure): Awake and Alert Pre-procedure Verification/Time Out Yes - 09:50 Taken: Start Time: 09:50 Pain Control: Lidocaine 4% T opical Solution Percent of Wound Bed Debrided: 100% T Area Debrided (cm): otal 4.71 Tissue and other material debrided: Non-Viable, Slough, Skin: Epidermis, Slough Level: Skin/Epidermis Debridement Description: Selective/Open Wound Instrument: Curette Bleeding: Minimum Hemostasis Achieved: Pressure Response to Treatment: Procedure was tolerated well Level of Consciousness (Post- Awake and Alert procedure): Brummond, Andrena ([id number]) [id number] Page 4 of 19 Post Debridement Measurements of Total Wound Length: (cm) 2.5 Width: (cm) 2.4 Depth: (cm) 0.1 Volume: (cm) 0.471 Character of Wound/Ulcer Post Debridement: Improved Post Procedure Diagnosis Same as Pre-procedure Electronic Signature(s) Signed: 02/12/2023 10:55:53 AM By: [name] MD FACS Signed: 02/12/2023 3:45:49 PM By: [name] Entered By: [name]  on 02/12/2023 09:52:20 -------------------------------------------------------------------------------- Debridement Details Patient Name: Date of Service: [name], [facility] RO N 02/12/2023 9:15 A M Medical Record Number: [id number] Patient Account Number: [id number] Date of Birth/Sex: Treating RN: 11-04-1940 (82 y.o. [name] Primary Care Provider: [name], [facility] Other Clinician: Referring Provider: Treating Provider/Extender: [name], [facility] Weeks in Treatment: 35 Debridement Performed for Assessment: Wound #8 Left,Lateral Foot Performed By: Physician [name], MD The following information was scribed by: [name] The information was scribed for: [name] Debridement Type: Debridement Level of Consciousness (Pre-procedure): Awake and Alert Pre-procedure Verification/Time Out Yes - 09:50 Taken: Start Time: 09:50 Pain Control: Lidocaine 4% T opical Solution Percent of Wound Bed Debrided: 100% T Area Debrided (cm): otal 0.27 Tissue and other material debrided: Non-Viable, Slough, Skin: Epidermis, Slough Level: Skin/Epidermis Debridement Description: Selective/Open Wound Instrument: Curette Bleeding: Minimum Hemostasis Achieved: Pressure Response to Treatment: Procedure was tolerated well Level of Consciousness (Post- Awake and Alert procedure): Post Debridement Measurements of Total Wound Length: (cm) 0.5 Stage: Category/Stage IV Width: (cm) 0.7 Depth: (cm) 0.1 Volume: (cm) 0.027 Character of Wound/Ulcer Post Debridement: Improved Post Procedure Diagnosis Same as Pre-procedure Electronic Signature(s) Signed: 02/12/2023 10:55:53 AM By: [name] MD FACS Signed: 02/12/2023 3:45:49 PM By: Ander Slade  Ladona Ridgel Entered By: [name] on 02/12/2023 09:52:40 Kopplin, Charidy ([id number]) [id number] Page 5 of  19 -------------------------------------------------------------------------------- Debridement Details Patient Name: Date of Service: [name] 02/12/2023 9:15 A M Medical Record Number: [id number] Patient Account Number: [id number] Date of Birth/Sex: Treating RN: [birth year] (82 y.o. [name] Primary Care Provider: [name], [facility] Other Clinician: Referring Provider: Treating Provider/Extender: [name], [facility] Weeks in Treatment: 35 Debridement Performed for Assessment: Wound #12 Left,Plantar T Third oe Performed By: Physician [name], MD The following information was scribed by: [name] The information was scribed for: [name] Debridement Type: Debridement Level of Consciousness (Pre-procedure): Awake and Alert Pre-procedure Verification/Time Out Yes - 09:50 Taken: Start Time: 09:50 Pain Control: Lidocaine 4% T opical Solution Percent of Wound Bed Debrided: 100% T Area Debrided (cm): otal 0.01 Tissue and other material debrided: Non-Viable, Slough, Slough Level: Non-Viable Tissue Debridement Description: Selective/Open Wound Instrument: Curette Bleeding: Minimum Hemostasis Achieved: Pressure Response to Treatment: Procedure was tolerated well Level of Consciousness (Post- Awake and Alert procedure): Post Debridement Measurements of Total Wound Length: (cm) 0.1 Width: (cm) 0.1 Depth: (cm) 0.1 Volume: (cm) 0.001 Character of Wound/Ulcer Post Debridement: Improved Post Procedure Diagnosis Same as Pre-procedure Electronic Signature(s) Signed: 02/12/2023 10:55:53 AM By: [name] MD FACS Signed: 02/12/2023 3:45:49 PM By: [name] Entered By: [name] on 02/12/2023 09:53:09 -------------------------------------------------------------------------------- HPI Details Patient Name: Date of Service: [name], [facility] RO N 02/12/2023 9:15 A M Medical Record Number: [id number] Patient  Account Number: [id number] Date of Birth/Sex: Treating RN: Aug 05, 1940 (82 y.o. F) Primary Care Provider: [name], [facility] Other Clinician: Referring Provider: Treating Provider/Extender: [name], [facility] Weeks in Treatment: 14 History of Present Illness HPI Description: ADMISSION 06/12/2022 This is an 82 year old woman with a history of CVA, crest syndrome, atrial fibrillation, rocker-bottom foot deformity. 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 She has been followed by podiatry for this and I am not entirely clear as to how she came to be referred here. LUYSTER, Evana ([id number]) [id number] Page 6 of 19 She resides in an assisted living facility. It is not clear what they have been putting on her wound, but on intake, she was noted to have denuded skin on her medial third toe, as well as ulcers on her dorsal great toe and lateral great toe. There is slough accumulation on both of the toe ulcers. There was an odor noted at intake, but after her foot was washed, the odor dissipated. Her toes are folded on top of each other creating areas of abrasion and pockets for moisture collection, which seems to be the primary cause of her ulceration. 06/20/2022: The skin between her toes and on the ball of her foot is completely macerated. There has been more tissue breakdown. The wound on her great toe has some slough accumulation. 06/27/2022: No change to her wounds today. There has been no further deterioration, but no significant improvement. She was both hypotensive and bradycardic on intake. 07/11/2022: Today, her foot is completely macerated. She reports that the wound care nurse actually soaked her foot and then applied foam, despite our specific orders to not use foam at all. She also is draining serous fluid from both legs and has 2+ pitting edema. She is on furosemide 20 mg twice a day. 07/19/2022: Once  again, her foot is completely macerated. There has been further tissue breakdown to the second and third toes and she now  has ulcers on the distal ends. The wounds on her medial and lateral great toe have thick slough accumulation. Apparently Xeroform was found between her toes on intake. Edema control is improved, but still not perfect. No overt drainage from her legs appreciated on exam today. 07/27/2022: She has less tissue maceration today. Edema control in her bilateral lower extremities is improved. Still with slough accumulation on all open wound surfaces. 08/08/2022: Significant improvement this week. She has very little tissue maceration and according to her aide, there has been very little drainage from her legs. There is some slough accumulation on the medial great toe ulcer. Edema control is improved bilaterally. She is spending more time in her bed with her legs elevated and less in her wheelchair with her legs in a dependent position. 08/20/2022: The edema in her legs is now well-controlled with the use of the zinc Unna boots. Unfortunately, this seems to have resulted in more drainage coming from the open areas of her feet. They are a bit macerated but there has not been as much tissue breakdown secondary to moisture as we have seen on previous visits. 08/28/2022: The only remaining open wound in her foot is on the dorsal great toe. The improvement in the rest of the foot is quite dramatic, with no tissue maceration or breakdown. She has been elevating her legs and wearing compression wraps. Edema control is excellent and there has been no drainage from her legs. 09/05/2022: Unfortunately, the distal half of her dorsal foot has broken down and has a layer of slough on the surface. This appears to be secondary to moisture accumulation. Her dressing was completely saturated. 09/20/2022: There has been further deterioration of her foot. The dressing that was applied was bizarre and involved  Coflex foam wrapped around her foot to the ankle and then Kerlix and Coban over that to the knee. She fortunately did have silver alginate between her toes but the tissue breakdown from moisture is extensive. 09/25/2022: There has been massive improvement in her foot since last week. The maceration has decreased, edema control is markedly better, and the wounds are showing evidence of healing, as opposed to worsening. 10/03/2022: The wound on her great toe is much smaller with minimal slough accumulation. The dorsal foot is also improving. She has a new ulcer on the plantar surface of her left fourth toe, however, and bone is exposed. Edema control and tissue maceration continues to be significantly better now that we are doing all of the dressing care. 10/09/2022: Unfortunately, it seems that the patient has resumed her habit of sitting in her wheelchair with her legs in a dependent position and there has been a lot more drainage and maceration on her foot. The dorsal foot wounds have expanded and are deeper. She has a new wound on her second toe and although the initial wound on her great toe has healed, she has a new wound on the lateral aspect of her great toe, immediately adjacent to that on her second toe, suggesting these have been caused by friction of the 2 toes rubbing against each other. Her son is participating in this visit via speaker phone. 10/24/2022: Last week she had a nurse visit due to clinic capacity and it appears that her dressing was done differently by the nurse that saw her then how it is usually performed by her regular nurse. Unfortunately, this meant there was more moisture-related tissue breakdown. The dorsum of her foot is open again and she has a new  wound on the lateral aspect of her fifth toe. There is slough accumulation in each of the sites. 11/01/2022: There has been remarkable turnaround since her last visit. All of the wounds are smaller. The wound on the dorsal aspect  of her great toe has closed completely. The wounds on the top of her foot have contracted and are starting to epithelialize. The new wound on the lateral aspect of her fifth toe does have some tendon exposure today that was not appreciated at her last visit. There is more tissue over the exposed bone on the plantar surface of her fourth toe, although bone does still remain exposed. 11/08/2022: All of her wounds are looking better again this week. The exposed bone on the plantar surface of the fourth toe has now been covered with soft tissue. The dorsal foot wounds are epithelializing nicely. The lateral fifth toe wound is smaller. There is slough on all of the surfaces. She had ABIs done in the vascular lab last week. Results are copied here: ABI Findings: +---------+------------------+-----+----------+--------+ Right Rt Pressure (mmHg)IndexWaveform Comment  +---------+------------------+-----+----------+--------+ Brachial 132     +---------+------------------+-----+----------+--------+ PTA 108 0.82 monophasic  +---------+------------------+-----+----------+--------+ DP 81 0.61 monophasic  +---------+------------------+-----+----------+--------+ Great T oe24 0.18 Abnormal   +---------+------------------+-----+----------+--------+ +---------+------------------+-----+----------+--------------------------------+ Left Lt Pressure (mmHg)IndexWaveform Comment  +---------+------------------+-----+----------+--------------------------------+ Brachial 121     +---------+------------------+-----+----------+--------------------------------+ PTA 88 0.67 monophasic  +---------+------------------+-----+----------+--------------------------------+ DP 111 0.84 monophasic  +---------+------------------+-----+----------+--------------------------------+ Great T   unable to obtain due to  oe     bandaging/ wound   +---------+------------------+-----+----------+--------------------------------+ +-------+-----------+-----------+------------+------------+ Marsh Dolly ([id number]) [phone].pdf Page 7 of 19 ABI/TBIT oday's ABIT oday's TBIPrevious ABIPrevious TBI +-------+-----------+-----------+------------+------------+ Right 0.82 0.18    +-------+-----------+-----------+------------+------------+ Left 0.84     +-------+-----------+-----------+------------+------------+ Summary: Right: Resting right ankle-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 The right toe-brachial index is abnormal. Left: Resting left ankle-brachial index indicates mild left lower extremity arterial disease. Unable to obtain TBI due to bandaging/wound. *See table(s) above for measurements and observations. 11/16/2022: All of her wounds are improving. The wound on the plantar surface of the fourth toe has good tissue covering the bone. The dorsal foot areas are epithelializing. The lateral foot ulcer still has muscle exposed; this is probably the most severe of her wounds at this time. She is scheduled to see vascular surgery on August 20. 11/20/2022: The wound on the plantar surface of the fourth toe seems to be closed. The dorsal foot areas are smaller. The lateral foot ulcer has muscle and tendon exposed. The medial great toe ulcer has bone exposed and the lateral great toe ulcer just has some slough on the surface. Unfortunately, the dorsal second toe ulcer now also has tendon exposure. 12/06/2022: The dorsal foot areas continue to contract. The lateral foot ulcer has some tendon exposure, but there is no longer any necrotic muscle or other nonviable soft tissue. The medial great toe ulcer is about the same with bone exposure. The dorsal second toe ulcer also has bone exposure. She has a new wound on her right anterior tibial surface. The fat layer is  exposed but it is fairly small. It is clean without any slough or eschar accumulation. 12/27/2022: I have not seen this patient in 3 weeks; she had a nurse visit 1 week after the last time I saw her and then has been unable to attend the subsequent visits for various reasons. She did undergo angiography on August 23. 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 There were no options for revascularization. Her wraps were on for 2 weeks and were fairly soupy when they were removed. The plantar fourth toe wound has reopened with bone exposure. The rest of the wounds are all about the same. 01/04/2023: No significant change to any of the wounds. 01/16/2023: Her wounds are all about the same. The dressings were on for almost 2 weeks and were little bit soupy when removed. 01/22/2023: The wounds are basically unchanged, but they were not as soupy as they were last week when her dressings were removed. There is slough accumulation at all of the surfaces. Bone remains exposed on the toe ulcers. There is no real necrosis visible at the lateral foot ulcer. The patient and her aide report that after discussion with the patient's son, the patient feels like she is interested in pursuing a surgical option with Dr. Lenell Antu. 01/30/2023: The wounds are little bit smaller today, but still with substantial slough accumulation and thick drainage. The wound on the plantar surface of the fourth toe has closed but she has opened a new wound on the plantar surface of her third toe. Bone remains exposed on the first and second toe wounds. The lateral fifth toe ulcer is more superficial. 02/05/2023: All of the wounds deteriorated quite a bit this week. There was much more drainage on her dressing today and it had a putrid odor. She admits to not keeping  her leg elevated. 02/12/2023: Once again, her dressings were very gooey and had an odor. There has been more moisture-related tissue breakdown on much of her foot, all of the dorsal wounds look better with decreased measurements and less slough accumulation. She does have an appointment with Dr. Lenell Antu coming up to discuss potential amputation. Electronic Signature(s) Signed: 02/12/2023 9:57:58 AM By: [name] MD FACS Entered By: [name] on 02/12/2023 09:57:58 -------------------------------------------------------------------------------- Physical Exam Details Patient Name: Date of Service: [name] [facility] RO N 02/12/2023 9:15 A M Medical Record Number: [id number] Patient Account Number: [id number] Date of Birth/Sex: Treating RN: 14-Apr-1941 (82 y.o. F) Primary Care Provider: [name], [facility] Other Clinician: Referring Provider: Treating Provider/Extender: [name] SHO KES, [facility] Weeks in Treatment: 35 Constitutional . . . . no acute distress. Respiratory Normal work of breathing on room air. Notes EVANGELISTA, Adyson ([id number]) [id number] Page 8 of 19 02/12/2023: Once again, her dressings were very gooey and had an odor. There has been more moisture-related tissue breakdown on much of her foot, all of the dorsal wounds look better with decreased measurements and less slough accumulation. Electronic Signature(s) Signed: 02/12/2023 9:59:50 AM By: [name] MD FACS Entered By: [name] on 02/12/2023 09:59:50 -------------------------------------------------------------------------------- Physician Orders Details Patient Name: Date of Service: [name], [facility] RO N 02/12/2023 9:15 A M Medical Record Number: [id number] Patient Account Number: [id number] Date of Birth/Sex: Treating RN: 1940-05-09 (82 y.o. [name] Primary Care Provider: [name], [facility] Other Clinician: Referring Provider: Treating  Provider/Extender: [name], [facility] Weeks in Treatment: 62 The following information was scribed by: [name] The information was scribed for: [name] Verbal / Phone Orders: No Diagnosis Coding Follow-up Appointments ppointment in 1 week. - Dr. Lady Gary - room 2 Return A Other: - facility - please paint her wounds with betadine and cover with gauze. wrap the foot with kerlix and ace. tubigrip to left lower leg. Anesthetic (In clinic) Topical Lidocaine 4% applied to wound bed -  USED in Clinic Bathing/ Shower/ Hygiene May shower and wash wound with soap and water. - with dressing changes Home Health Admit to Home Health for skilled nursing wound care. May utilize formulary equivalent dressing for wound treatment orders unless otherwise specified. New wound care orders this week; continue Home Health for wound care. May utilize formulary equivalent dressing for wound treatment orders unless otherwise specified. Other Home Health Orders/Instructions: - [facility] Wound Treatment Wound #10 - T Second oe Wound Laterality: Left Cleanser: Soap and Water 1 x Per Day/30 Days Discharge Instructions: May shower and wash wound with dial antibacterial soap and water prior to dressing change. Cleanser: Wound Cleanser 1 x Per Day/30 Days Discharge Instructions: Cleanse the wound with wound cleanser prior to applying a clean dressing using gauze sponges, not tissue or cotton balls. Peri-Wound Care: Sween Lotion (Moisturizing lotion) 1 x Per Day/30 Days Discharge Instructions: Apply moisturizing lotion as directed Topical: Betadine 1 x Per Day/30 Days Discharge Instructions: Paint on wounds. Secondary Dressing: Woven Gauze Sponge, Non-Sterile 4x4 in 1 x Per Day/30 Days Discharge Instructions: can use gauze between toes (if no desired) Secured With: Elastic Bandage 4 inch (ACE bandage) 1 x Per Day/30 Days Discharge Instructions: Secure with ACE bandage as  directed. Secured With: [employer], 4.5x3.1 (in/yd) 1 x Per Day/30 Days Discharge Instructions: Secure with Kerlix as directed. Compression Wrap: tubigrip 1 x Per Day/30 Days Wound #12 - T Third oe Wound Laterality: Plantar, Left Cleanser: Soap and Water 1 x Per Day/30 Days Discharge Instructions: May shower and wash wound with dial antibacterial soap and water prior to dressing change. Cleanser: Wound Cleanser 1 x Per Day/30 Days Discharge Instructions: Cleanse the wound with wound cleanser prior to applying a clean dressing using gauze sponges, not tissue or cotton balls. GOITIA, Loriene ([id number]) [id number] Page 9 of 19 Peri-Wound Care: Sween Lotion (Moisturizing lotion) 1 x Per Day/30 Days Discharge Instructions: Apply moisturizing lotion as directed Topical: Betadine 1 x Per Day/30 Days Discharge Instructions: Paint on wounds. Secondary Dressing: Woven Gauze Sponge, Non-Sterile 4x4 in 1 x Per Day/30 Days Discharge Instructions: can use gauze between toes (if no desired) Secured With: Elastic Bandage 4 inch (ACE bandage) 1 x Per Day/30 Days Discharge Instructions: Secure with ACE bandage as directed. Secured With: [employer], 4.5x3.1 (in/yd) 1 x Per Day/30 Days Discharge Instructions: Secure with Kerlix as directed. Compression Wrap: tubigrip 1 x Per Day/30 Days Wound #13 - T Fourth oe Wound Laterality: Left Cleanser: Soap and Water 1 x Per Day/30 Days Discharge Instructions: May shower and wash wound with dial antibacterial soap and water prior to dressing change. Cleanser: Wound Cleanser 1 x Per Day/30 Days Discharge Instructions: Cleanse the wound with wound cleanser prior to applying a clean dressing using gauze sponges, not tissue or cotton balls. Peri-Wound Care: Sween Lotion (Moisturizing lotion) 1 x Per Day/30 Days Discharge Instructions: Apply moisturizing lotion as directed Topical: Betadine 1 x Per Day/30 Days Discharge  Instructions: Paint on wounds. Secondary Dressing: Woven Gauze Sponge, Non-Sterile 4x4 in 1 x Per Day/30 Days Discharge Instructions: can use gauze between toes (if no desired) Secured With: Elastic Bandage 4 inch (ACE bandage) 1 x Per Day/30 Days Discharge Instructions: Secure with ACE bandage as directed. Secured With: [employer], 4.5x3.1 (in/yd) 1 x Per Day/30 Days Discharge Instructions: Secure with Kerlix as directed. Compression Wrap: tubigrip 1 x Per Day/30 Days Wound #6 - Foot Wound Laterality: Dorsal, Left Cleanser: Soap and Water 1 x Per Day/30 Days  Discharge Instructions: May shower and wash wound with dial antibacterial soap and water prior to dressing change. Cleanser: Wound Cleanser 1 x Per Day/30 Days Discharge Instructions: Cleanse the wound with wound cleanser prior to applying a clean dressing using gauze sponges, not tissue or cotton balls. Peri-Wound Care: Sween Lotion (Moisturizing lotion) 1 x Per Day/30 Days Discharge Instructions: Apply moisturizing lotion as directed Topical: Betadine 1 x Per Day/30 Days Discharge Instructions: Paint on wounds. Secondary Dressing: Woven Gauze Sponge, Non-Sterile 4x4 in 1 x Per Day/30 Days Discharge Instructions: can use gauze between toes (if no desired) Secured With: Elastic Bandage 4 inch (ACE bandage) 1 x Per Day/30 Days Discharge Instructions: Secure with ACE bandage as directed. Secured With: [employer], 4.5x3.1 (in/yd) 1 x Per Day/30 Days Discharge Instructions: Secure with Kerlix as directed. Compression Wrap: tubigrip 1 x Per Day/30 Days Wound #8 - Foot Wound Laterality: Left, Lateral Cleanser: Soap and Water 1 x Per Day/30 Days Discharge Instructions: May shower and wash wound with dial antibacterial soap and water prior to dressing change. Cleanser: Wound Cleanser 1 x Per Day/30 Days Discharge Instructions: Cleanse the wound with wound cleanser prior to applying a clean dressing using gauze sponges, not  tissue or cotton balls. Peri-Wound Care: Sween Lotion (Moisturizing lotion) 1 x Per Day/30 Days Discharge Instructions: Apply moisturizing lotion as directed Grable, Lynne ([id number]) [phone].pdf Page 10 of 19 Topical: Betadine 1 x Per Day/30 Days Discharge Instructions: Paint on wounds. Secondary Dressing: Woven Gauze Sponge, Non-Sterile 4x4 in 1 x Per Day/30 Days Discharge Instructions: can use gauze between toes (if no desired) Secured With: Elastic Bandage 4 inch (ACE bandage) 1 x Per Day/30 Days Discharge Instructions: Secure with ACE bandage as directed. Secured With: [employer], 4.5x3.1 (in/yd) 1 x Per Day/30 Days Discharge Instructions: Secure with Kerlix as directed. Compression Wrap: tubigrip 1 x Per Day/30 Days Wound #9 - T Great oe Wound Laterality: Left, Lateral Cleanser: Soap and Water 1 x Per Day/30 Days Discharge Instructions: May shower and wash wound with dial antibacterial soap and water prior to dressing change. Cleanser: Wound Cleanser 1 x Per Day/30 Days Discharge Instructions: Cleanse the wound with wound cleanser prior to applying a clean dressing using gauze sponges, not tissue or cotton balls. Peri-Wound Care: Sween Lotion (Moisturizing lotion) 1 x Per Day/30 Days Discharge Instructions: Apply moisturizing lotion as directed Topical: Betadine 1 x Per Day/30 Days Discharge Instructions: Paint on wounds. Secondary Dressing: Woven Gauze Sponge, Non-Sterile 4x4 in 1 x Per Day/30 Days Discharge Instructions: can use gauze between toes (if no desired) Secured With: Elastic Bandage 4 inch (ACE bandage) 1 x Per Day/30 Days Discharge Instructions: Secure with ACE bandage as directed. Secured With: [employer], 4.5x3.1 (in/yd) 1 x Per Day/30 Days Discharge Instructions: Secure with Kerlix as directed. Compression Wrap: tubigrip 1 x Per Day/30 Days Patient Medications llergies: Sulfa (Sulfonamide  Antibiotics) A Notifications Medication Indication Start End 02/12/2023 lidocaine DOSE topical 4 % cream - cream topical 02/12/2023 levofloxacin DOSE oral 750 mg tablet - 1 tab p.o. daily x 7 days Electronic Signature(s) Signed: 02/15/2023 8:16:28 AM By: [name] MD FACS Signed: 02/15/2023 11:55:59 AM By: [name] Previous Signature: 02/12/2023 10:55:53 AM Version By: [name] MD FACS Previous Signature: 02/12/2023 10:03:52 AM Version By: [name] MD FACS Entered By: [name] on 02/15/2023 07:47:16 -------------------------------------------------------------------------------- Problem List Details Patient Name: Date of Service: [name], [facility] RO N 02/12/2023 9:15 A M Medical Record Number: [id number] Patient Account Number: [id number]  Date of Birth/Sex: Treating RN: 09-Jun-1940 (82 y.o. 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 10/03/2022 No Yes without evidence of necrosis L97.525 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 11/01/2022 No Yes without evidence of necrosis 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 06/12/2022 No Yes I10 Essential (primary) hypertension 06/12/2022 No Yes 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 06/12/2022 No Yes 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 atrial fibrillation 06/12/2022 No Yes Z79.01 Long term (current) use of anticoagulants 06/12/2022 No Yes I89.0 Lymphedema, not elsewhere classified 08/20/2022 No  Yes Inactive Problems ICD-10 Code Description Active Date Inactive Date L97.521 Non-pressure chronic ulcer of other part of left foot limited to breakdown of skin 06/12/2022 06/12/2022 Resolved Problems ICD-10 Code Description Active Date Resolved Date L97.812 Non-pressure chronic ulcer of other part of right lower leg with fat layer exposed 12/06/2022 12/06/2022 Electronic Signature(s) Signed: 02/12/2023 9:56:33 AM By: [name] MD FACS Entered By: [name] on 02/12/2023 09:56:33 -------------------------------------------------------------------------------- Progress Note Details Patient Name: Date of Service: [name], [facility] RO N 02/12/2023 9:15 A M Medical Record Number: [id number] Patient Account Number: [id number] STARLENE, SEEFELDT ([id number]) [phone].pdf Page 12 of 19 Date of Birth/Sex: Treating RN: [birth year] (82 y.o. F) Primary Care Provider: [name], [facility] Other Clinician: Referring Provider: Treating Provider/Extender: [name], [facility] Weeks in Treatment: 12 Subjective Chief Complaint Information obtained from Patient Patient seen for complaints of Non-Healing Wound. History of Present Illness (HPI) ADMISSION 06/12/2022 This is an 82 year old woman with a history of CVA, crest syndrome, atrial fibrillation, rocker-bottom foot deformity. 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 She has been followed by podiatry for this and I am not entirely clear as to how she came to be referred here. She resides in an assisted living facility. It is not clear what they have been putting on her wound, but on intake, she was noted to have denuded skin on her medial third toe, as well as ulcers on her dorsal great toe and lateral great toe. There is slough accumulation on both of the toe ulcers. There was an odor noted at intake, but after her foot was washed, the odor dissipated. Her toes  are folded on top of each other creating areas of abrasion and pockets for moisture collection, which seems to be the primary cause of her ulceration. 06/20/2022: The skin between her toes and on the ball of her foot is completely macerated. There has been more tissue breakdown. The wound on her great toe has some slough accumulation. 06/27/2022: No change to her wounds today. There has been no further deterioration, but no significant improvement. She was both hypotensive and bradycardic on intake. 07/11/2022: Today, her foot is completely macerated. She reports that the wound care nurse actually soaked her foot and then applied foam, despite our specific orders to not use foam at all. She also is draining serous fluid from both legs and has 2+ pitting edema. She is on furosemide 20 mg twice a day. 07/19/2022: Once again, her foot is completely macerated. There has been further tissue breakdown to the second  and third toes and she now has ulcers on the distal ends. The wounds on her medial and lateral great toe have thick slough accumulation. Apparently Xeroform was found between her toes on intake. Edema control is improved, but still not perfect. No overt drainage from her legs appreciated on exam today. 07/27/2022: She has less tissue maceration today. Edema control in her bilateral lower extremities is improved. Still with slough accumulation on all open wound surfaces. 08/08/2022: Significant improvement this week. She has very little tissue maceration and according to her aide, there has been very little drainage from her legs. There is some slough accumulation on the medial great toe ulcer. Edema control is improved bilaterally. She is spending more time in her bed with her legs elevated and less in her wheelchair with her legs in a dependent position. 08/20/2022: The edema in her legs is now well-controlled with the use of the zinc Unna boots. Unfortunately, this seems to have resulted in more  drainage coming from the open areas of her feet. They are a bit macerated but there has not been as much tissue breakdown secondary to moisture as we have seen on previous visits. 08/28/2022: The only remaining open wound in her foot is on the dorsal great toe. The improvement in the rest of the foot is quite dramatic, with no tissue maceration or breakdown. She has been elevating her legs and wearing compression wraps. Edema control is excellent and there has been no drainage from her legs. 09/05/2022: Unfortunately, the distal half of her dorsal foot has broken down and has a layer of slough on the surface. This appears to be secondary to moisture accumulation. Her dressing was completely saturated. 09/20/2022: There has been further deterioration of her foot. The dressing that was applied was bizarre and involved Coflex foam wrapped around her foot to the ankle and then Kerlix and Coban over that to the knee. She fortunately did have silver alginate between her toes but the tissue breakdown from moisture is extensive. 09/25/2022: There has been massive improvement in her foot since last week. The maceration has decreased, edema control is markedly better, and the wounds are showing evidence of healing, as opposed to worsening. 10/03/2022: The wound on her great toe is much smaller with minimal slough accumulation. The dorsal foot is also improving. She has a new ulcer on the plantar surface of her left fourth toe, however, and bone is exposed. Edema control and tissue maceration continues to be significantly better now that we are doing all of the dressing care. 10/09/2022: Unfortunately, it seems that the patient has resumed her habit of sitting in her wheelchair with her legs in a dependent position and there has been a lot more drainage and maceration on her foot. The dorsal foot wounds have expanded and are deeper. She has a new wound on her second toe and although the initial wound on her great toe  has healed, she has a new wound on the lateral aspect of her great toe, immediately adjacent to that on her second toe, suggesting these have been caused by friction of the 2 toes rubbing against each other. Her son is participating in this visit via speaker phone. 10/24/2022: Last week she had a nurse visit due to clinic capacity and it appears that her dressing was done differently by the nurse that saw her then how it is usually performed by her regular nurse. Unfortunately, this meant there was more moisture-related tissue breakdown. The dorsum of her foot is  open again and she has a new wound on the lateral aspect of her fifth toe. There is slough accumulation in each of the sites. 11/01/2022: There has been remarkable turnaround since her last visit. All of the wounds are smaller. The wound on the dorsal aspect of her great toe has closed completely. The wounds on the top of her foot have contracted and are starting to epithelialize. The new wound on the lateral aspect of her fifth toe does have some tendon exposure today that was not appreciated at her last visit. There is more tissue over the exposed bone on the plantar surface of her fourth toe, although bone does still remain exposed. 11/08/2022: All of her wounds are looking better again this week. The exposed bone on the plantar surface of the fourth toe has now been covered with soft tissue. The dorsal foot wounds are epithelializing nicely. The lateral fifth toe wound is smaller. There is slough on all of the surfaces. She had ABIs done in the vascular lab last week. Results are copied here: ABI Findings: +---------+------------------+-----+----------+--------+ Right Rt Pressure (mmHg)IndexWaveform Comment  +---------+------------------+-----+----------+--------+ Brachial 132     +---------+------------------+-----+----------+--------+ PTA 108 0.82 monophasic   +---------+------------------+-----+----------+--------+ Piano, Oletta ([id number]) [id number] Page 13 of 19 DP 81 0.61 monophasic  +---------+------------------+-----+----------+--------+ Great T oe24 0.18 Abnormal   +---------+------------------+-----+----------+--------+ +---------+------------------+-----+----------+--------------------------------+ Left Lt Pressure (mmHg)IndexWaveform Comment  +---------+------------------+-----+----------+--------------------------------+ Brachial 121     +---------+------------------+-----+----------+--------------------------------+ PTA 88 0.67 monophasic  +---------+------------------+-----+----------+--------------------------------+ DP 111 0.84 monophasic  +---------+------------------+-----+----------+--------------------------------+ Great T   unable to obtain due to  oe     bandaging/ wound  +---------+------------------+-----+----------+--------------------------------+ +-------+-----------+-----------+------------+------------+ ABI/TBIT oday's ABIT oday's TBIPrevious ABIPrevious TBI +-------+-----------+-----------+------------+------------+ Right 0.82 0.18    +-------+-----------+-----------+------------+------------+ Left 0.84     +-------+-----------+-----------+------------+------------+ Summary: Right: Resting right ankle-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 The right toe-brachial index is abnormal. Left: Resting left ankle-brachial index indicates mild left lower extremity arterial disease. Unable to obtain TBI due to bandaging/wound. *See table(s) above for measurements and observations. 11/16/2022: All of her wounds are improving. The wound on the plantar surface of the fourth toe has good tissue covering the bone. The dorsal foot areas are epithelializing. The lateral foot ulcer still has muscle  exposed; this is probably the most severe of her wounds at this time. She is scheduled to see vascular surgery on August 20. 11/20/2022: The wound on the plantar surface of the fourth toe seems to be closed. The dorsal foot areas are smaller. The lateral foot ulcer has muscle and tendon exposed. The medial great toe ulcer has bone exposed and the lateral great toe ulcer just has some slough on the surface. Unfortunately, the dorsal second toe ulcer now also has tendon exposure. 12/06/2022: The dorsal foot areas continue to contract. The lateral foot ulcer has some tendon exposure, but there is no longer any necrotic muscle or other nonviable soft tissue. The medial great toe ulcer is about the same with bone exposure. The dorsal second toe ulcer also has bone exposure. She has a new wound on her right anterior tibial surface. The fat layer is exposed but it is fairly small. It is clean without any slough or eschar accumulation. 12/27/2022: I have not seen this patient in 3 weeks; she had a nurse visit 1 week after the last time I saw her and then has been unable to attend the subsequent visits for various reasons. She did undergo angiography on August 23. 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 There were no options for revascularization. Her wraps were on for 2 weeks and were fairly soupy when they were removed. The plantar fourth toe wound has reopened with bone exposure. The rest of the wounds are all about the same. 01/04/2023: No significant change to any of the wounds. 01/16/2023: Her wounds are all about the same. The dressings were on for almost 2 weeks and were little bit soupy when removed. 01/22/2023: The wounds are basically unchanged, but they were not as soupy as they were last week when her dressings were  removed. There is slough accumulation at all of the surfaces. Bone remains exposed on the toe ulcers. There is no real necrosis visible at the lateral foot ulcer. The patient and her aide report that after discussion with the patient's son, the patient feels like she is interested in pursuing a surgical option with Dr. Lenell Antu. 01/30/2023: The wounds are little bit smaller today, but still with substantial slough accumulation and thick drainage. The wound on the plantar surface of the fourth toe has closed but she has opened a new wound on the plantar surface of her third toe. Bone remains exposed on the first and second toe wounds. The lateral fifth toe ulcer is more superficial. 02/05/2023: All of the wounds deteriorated quite a bit this week. There was much more drainage on her dressing today and it had a putrid odor. She admits to not keeping her leg elevated. 02/12/2023: Once again, her dressings were very gooey and had an odor. There has been more moisture-related tissue breakdown on much of her foot, all of the dorsal wounds look better with decreased measurements and less slough accumulation. She does have an appointment with Dr. Lenell Antu coming up to discuss potential amputation. Patient History Information obtained from Patient. Family History Unknown History. Social History Former smoker - smoked when she was young, Marital Status - Widowed, Alcohol Use - Never, Drug Use - No History, Caffeine Use - Daily. Medical History Hematologic/Lymphatic Patient has history of Anemia Cardiovascular Patient has history of Angina - a-fib, Hypertension, Vasculitis Endocrine Denies history of Type I Diabetes, Type II Diabetes Musculoskeletal [place], Miliani ([id number]) [id number] Page 14 of 19 Patient has history of Osteoarthritis Hospitalization/Surgery History - cholecystectomy. - leg surgery. - tonsillectomy. 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 Vitals Time Taken: 9:22 AM, Height: 67 in, Weight: 153 lbs, BMI: 24, Temperature: 97.7 F, Pulse: 92 bpm, Respiratory Rate: 18 breaths/min, Blood Pressure: 102/67 mmHg. Respiratory Normal work of breathing on room air. General Notes: 02/12/2023: Once again, her dressings were very gooey and had an odor. There has been more moisture-related tissue breakdown on much of her foot, all of the dorsal wounds look better with decreased measurements and less slough accumulation. Integumentary (Hair, Skin) Wound #10 status is Open. Original cause of wound was Gradually Appeared. The date acquired was: 09/05/2022. The wound has been in treatment 9 weeks. The wound is located on the Left T Second. The wound measures 0.2cm length x 0.4cm width x 0.1cm depth; 0.063cm^2 area and 0.006cm^3 volume. There oe is Fat Layer (Subcutaneous Tissue) exposed. There is no tunneling or undermining noted. There is a medium amount of serosanguineous drainage noted. The wound margin is distinct with the outline attached to the wound base. There is small (1-33%) pink  granulation within the wound bed. There is a large (67-100%) amount of necrotic tissue within the wound bed including Adherent Slough. The periwound skin appearance had no abnormalities noted for texture. The periwound skin appearance had no abnormalities noted for moisture. The periwound skin appearance had no abnormalities noted for color. Periwound temperature was noted as No Abnormality. The periwound has tenderness on palpation. Wound #12 status is Open. Original cause of wound was Gradually Appeared. The date acquired was: 01/30/2023. The wound has been in treatment 1 weeks. The wound is located on the Left,Plantar T Third. The wound measures 0.1cm length x 0.1cm width x 0.1cm depth; 0.008cm^2  area and 0.001cm^3 volume. oe There is Fat Layer (Subcutaneous Tissue) exposed. There is no tunneling or undermining noted. There is a medium amount of serosanguineous drainage noted. The wound margin is distinct with the outline attached to the wound base. There is no granulation within the wound bed. There is a large (67-100%) amount of necrotic tissue within the wound bed including Adherent Slough. The periwound skin appearance had no abnormalities noted for color. The periwound skin appearance exhibited: Excoriation, Maceration. Periwound temperature was noted as No Abnormality. The periwound has tenderness on palpation. Wound #13 status is Open. Original cause of wound was Gradually Appeared. The date acquired was: 02/05/2023. The wound has been in treatment 1 weeks. The wound is located on the Left T Fourth. The wound measures 0.2cm length x 0.4cm width x 0.1cm depth; 0.063cm^2 area and 0.006cm^3 volume. There is oe bone and Fat Layer (Subcutaneous Tissue) exposed. There is no tunneling or undermining noted. There is a medium amount of serosanguineous drainage noted. The wound margin is distinct with the outline attached to the wound base. There is small (1-33%) pink granulation within the wound bed. There is a large (67-100%) amount of necrotic tissue within the wound bed including Adherent Slough. The periwound skin appearance had no abnormalities noted for texture. The periwound skin appearance had no abnormalities noted for color. The periwound skin appearance exhibited: Maceration. Periwound temperature was noted as No Abnormality. The periwound has tenderness on palpation. Wound #6 status is Open. Original cause of wound was Gradually Appeared. The date acquired was: 09/05/2022. The wound has been in treatment 22 weeks. The wound is located on the Left,Dorsal Foot. The wound measures 2.5cm length x 2.4cm width x 0.1cm depth; 4.712cm^2 area and 0.471cm^3 volume. There is Fat Layer  (Subcutaneous Tissue) exposed. There is no tunneling or undermining noted. There is a medium amount of serosanguineous drainage noted. The wound margin is indistinct and nonvisible. There is large (67-100%) pink granulation within the wound bed. There is a small (1-33%) amount of necrotic tissue within the wound bed including Adherent Slough. The periwound skin appearance had no abnormalities noted for texture. The periwound skin appearance had no abnormalities noted for moisture. The periwound skin appearance exhibited: Rubor. Periwound temperature was noted as No Abnormality. The periwound has tenderness on palpation. Wound #8 status is Open. Original cause of wound was Pressure Injury. The date acquired was: 10/24/2022. The wound has been in treatment 15 weeks. The wound is located on the Left,Lateral Foot. The wound measures 0.5cm length x 0.7cm width x 0.1cm depth; 0.275cm^2 area and 0.027cm^3 volume. There is Fat Layer (Subcutaneous Tissue) exposed. There is no tunneling or undermining noted. There is a medium amount of serosanguineous drainage noted. The wound margin is distinct with the outline attached to the wound base. There is no granulation within the wound bed. There is  a large (67-100%) amount of necrotic tissue within the wound bed including Adherent Slough. The periwound skin appearance had no abnormalities noted for texture. The periwound skin appearance had no abnormalities noted for moisture. The periwound skin appearance had no abnormalities noted for color. Periwound temperature was noted as No Abnormality. The periwound has tenderness on palpation. Wound #9 status is Open. Original cause of wound was Gradually Appeared. The date acquired was: 11/01/2022. The wound has been in treatment 14 weeks. The wound is located on the [employer]. The wound measures 0.3cm length x 0.3cm width x 0.1cm depth; 0.071cm^2 area and 0.007cm^3 volume. There oe is bone and Fat Layer  (Subcutaneous Tissue) exposed. There is no tunneling or undermining noted. There is a medium amount of serous drainage noted. The wound margin is flat and intact. There is small (1-33%) pink granulation within the wound bed. There is a large (67-100%) amount of necrotic tissue within the wound bed including Adherent Slough. The periwound skin appearance had no abnormalities noted for texture. The periwound skin appearance had no abnormalities noted for moisture. The periwound skin appearance had no abnormalities noted for color. Periwound temperature was noted as No Abnormality. The periwound has tenderness on palpation. 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 There was a Selective/Open Wound Non-Viable Tissue Debridement oe with a total area of 0.06 sq cm performed by [name], MD. With the following instrument(s): Curette to remove Non-Viable tissue/material. Material removed includes [facility] after achieving pain control using Lidocaine 4% Topical Solution. No specimens were taken. A time out was conducted at 09:50, prior to the start of the procedure. A Minimum amount of bleeding was controlled with Pressure. The procedure was tolerated well. Post Debridement Measurements: 0.2cm length x  0.4cm width x 0.1cm depth; 0.006cm^3 volume. Character of Wound/Ulcer Post Debridement is improved. Post procedure Diagnosis Wound #10: Same as Pre-Procedure Wound #12 Pre-procedure diagnosis of Wound #12 is a Lymphedema located on the Left,Plantar T Third . There was a Selective/Open Wound Non-Viable Tissue oe Debridement with a total area of 0.01 sq cm performed by [name], MD. With the following instrument(s): Curette to remove Non-Viable tissue/material. Material removed includes [facility] after achieving pain control using Lidocaine 4% Topical Solution. No specimens were taken. A time out was conducted at 09:50, prior to the start of the procedure. A Minimum amount of bleeding was controlled with Pressure. The procedure was tolerated well. Post Debridement Measurements: 0.1cm length x 0.1cm width x 0.1cm depth; 0.001cm^3 volume. Character of Wound/Ulcer Post Debridement is improved. Post procedure Diagnosis Wound #12: Same as Pre-Procedure Wound #13 Pre-procedure diagnosis of Wound #13 is an Atypical located on the Left T Fourth . There was a Selective/Open Wound Non-Viable Tissue Debridement with oe a total area of 0.06 sq cm performed by [name], MD. With the following instrument(s): Curette to remove Non-Viable tissue/material. Material removed includes [facility] after achieving pain control using Lidocaine 4% Topical  Solution. No specimens were taken. A time out was conducted at 09:50, prior to the start of the procedure. A Minimum amount of bleeding was controlled with Pressure. The procedure was tolerated well. Post Debridement Measurements: 0.2cm length x 0.4cm width x 0.1cm depth; 0.006cm^3 volume. Character of Wound/Ulcer Post Debridement is improved. Post procedure Diagnosis Wound #13: Same as Pre-Procedure Wound #6 Pre-procedure diagnosis of Wound #6 is a Lymphedema located on the Left,Dorsal Foot . There was a Selective/Open Wound Skin/Epidermis Debridement with a  total area of 4.71 sq cm performed by [name], MD. With the following instrument(s): Curette to remove Non-Viable tissue/material. Material removed includes [facility] and Skin: Epidermis and after achieving pain control using Lidocaine 4% T opical Solution. No specimens were taken. A time out was conducted at 09:50, prior to the start of the procedure. A Minimum amount of bleeding was controlled with Pressure. The procedure was tolerated well. Post Debridement Measurements: 2.5cm length x 2.4cm width x 0.1cm depth; 0.471cm^3 volume. Character of Wound/Ulcer Post Debridement is improved. 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 There was a Selective/Open Wound Skin/Epidermis Debridement with a total area of 0.27 sq cm performed by [name], MD. With the following instrument(s): Curette to remove Non-Viable tissue/material. Material removed includes [facility] and Skin: Epidermis and after achieving pain control using Lidocaine 4% T opical Solution. No specimens were taken. A time out was conducted at 09:50, prior to the start of the procedure. A Minimum amount of bleeding was controlled with Pressure. The procedure was tolerated well. Post Debridement Measurements: 0.5cm length x 0.7cm width x 0.1cm depth; 0.027cm^3 volume. Post debridement Stage noted as Category/Stage IV. Character of Wound/Ulcer Post Debridement is improved. 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 There was a Selective/Open Wound Non-Viable Tissue oe Debridement with a total area of 0.07 sq cm performed by [name], MD. With the following instrument(s): Curette to remove Non-Viable tissue/material. Material removed includes [facility] after achieving pain control using Lidocaine 4%  Topical Solution. No specimens were taken. A time out was conducted at 09:50, prior to the start of the procedure. A Minimum amount of bleeding was controlled with Pressure. The procedure was tolerated well. Post Debridement Measurements: 0.3cm length x 0.3cm width x 0.1cm depth; 0.007cm^3 volume. Character of Wound/Ulcer Post Debridement is improved. Post procedure Diagnosis Wound #9: Same as Pre-Procedure Plan Follow-up Appointments: ANTIANA, VAUSE ([id number]) [id number] Page 16 of 19 Return Appointment in 1 week. - Dr. Lady Gary - room 2 Other: - facility - please paint her wounds with betadine and cover with gauze. wrap the foot with kerlix and ace. tubigrip to left lower leg. Anesthetic: (In clinic) Topical Lidocaine 4% applied to wound bed - USED in Clinic Bathing/ Shower/ Hygiene: May shower and wash wound with soap and water. - with dressing changes Edema Control - Lymphedema / SCD / Other: Elevate legs to the level of the heart or above for 30 minutes daily and/or when sitting for 3-4 times a day throughout the day. Avoid standing for long periods of time. Patient to wear own compression stockings every day. Moisturize legs daily. The following medication(s) was prescribed: lidocaine topical 4 % cream cream topical was prescribed at facility levofloxacin oral 750 mg tablet 1 tab p.o. daily x 7 days starting 02/12/2023 WOUND #10: -  T Second Wound Laterality: Left oe Cleanser: Soap and Water 1 x Per Day/30 Days Discharge Instructions: May shower and wash wound with dial antibacterial soap and water prior to dressing change. Cleanser: Wound Cleanser 1 x Per Day/30 Days Discharge Instructions: Cleanse the wound with wound cleanser prior to applying a clean dressing using gauze sponges, not tissue or cotton balls. Peri-Wound Care: Sween Lotion (Moisturizing lotion) 1 x Per Day/30 Days Discharge Instructions: Apply moisturizing lotion as directed Topical:  Betadine 1 x Per Day/30 Days Discharge Instructions: Paint on wounds. Secondary Dressing: Woven Gauze Sponge, Non-Sterile 4x4 in 1 x Per Day/30 Days Discharge Instructions: can use gauze between toes (if no desired) Secured With: Elastic Bandage 4 inch (ACE bandage) 1 x Per Day/30 Days Discharge Instructions: Secure with ACE bandage as directed. Secured With: [employer], 4.5x3.1 (in/yd) 1 x Per Day/30 Days Discharge Instructions: Secure with Kerlix as directed. Com pression Wrap: tubigrip 1 x Per Day/30 Days WOUND #12: - T Third Wound Laterality: Plantar, Left oe Cleanser: Soap and Water 1 x Per Day/30 Days Discharge Instructions: May shower and wash wound with dial antibacterial soap and water prior to dressing change. Cleanser: Wound Cleanser 1 x Per Day/30 Days Discharge Instructions: Cleanse the wound with wound cleanser prior to applying a clean dressing using gauze sponges, not tissue or cotton balls. Peri-Wound Care: Sween Lotion (Moisturizing lotion) 1 x Per Day/30 Days Discharge Instructions: Apply moisturizing lotion as directed Topical: Betadine 1 x Per Day/30 Days Discharge Instructions: Paint on wounds. Secondary Dressing: Woven Gauze Sponge, Non-Sterile 4x4 in 1 x Per Day/30 Days Discharge Instructions: can use gauze between toes (if no desired) Secured With: Elastic Bandage 4 inch (ACE bandage) 1 x Per Day/30 Days Discharge Instructions: Secure with ACE bandage as directed. Secured With: [employer], 4.5x3.1 (in/yd) 1 x Per Day/30 Days Discharge Instructions: Secure with Kerlix as directed. Com pression Wrap: tubigrip 1 x Per Day/30 Days WOUND #13: - T Fourth Wound Laterality: Left oe Cleanser: Soap and Water 1 x Per Day/30 Days Discharge Instructions: May shower and wash wound with dial antibacterial soap and water prior to dressing change. Cleanser: Wound Cleanser 1 x Per Day/30 Days Discharge Instructions: Cleanse the wound with wound cleanser prior  to applying a clean dressing using gauze sponges, not tissue or cotton balls. Peri-Wound Care: Sween Lotion (Moisturizing lotion) 1 x Per Day/30 Days Discharge Instructions: Apply moisturizing lotion as directed Topical: Betadine 1 x Per Day/30 Days Discharge Instructions: Paint on wounds. Secondary Dressing: Woven Gauze Sponge, Non-Sterile 4x4 in 1 x Per Day/30 Days Discharge Instructions: can use gauze between toes (if no desired) Secured With: Elastic Bandage 4 inch (ACE bandage) 1 x Per Day/30 Days Discharge Instructions: Secure with ACE bandage as directed. Secured With: [employer], 4.5x3.1 (in/yd) 1 x Per Day/30 Days Discharge Instructions: Secure with Kerlix as directed. Com pression Wrap: tubigrip 1 x Per Day/30 Days WOUND #6: - Foot Wound Laterality: Dorsal, Left Cleanser: Soap and Water 1 x Per Day/30 Days Discharge Instructions: May shower and wash wound with dial antibacterial soap and water prior to dressing change. Cleanser: Wound Cleanser 1 x Per Day/30 Days Discharge Instructions: Cleanse the wound with wound cleanser prior to applying a clean dressing using gauze sponges, not tissue or cotton balls. Peri-Wound Care: Sween Lotion (Moisturizing lotion) 1 x Per Day/30 Days Discharge Instructions: Apply moisturizing lotion as directed Topical: Betadine 1 x Per Day/30 Days Discharge Instructions: Paint on wounds. Secondary Dressing: Woven Gauze Sponge,  Non-Sterile 4x4 in 1 x Per Day/30 Days Discharge Instructions: can use gauze between toes (if no desired) Secured With: Elastic Bandage 4 inch (ACE bandage) 1 x Per Day/30 Days Discharge Instructions: Secure with ACE bandage as directed. Secured With: [employer], 4.5x3.1 (in/yd) 1 x Per Day/30 Days Discharge Instructions: Secure with Kerlix as directed. Com pression Wrap: tubigrip 1 x Per Day/30 Days WOUND #8: - Foot Wound Laterality: Left, Lateral Cleanser: Soap and Water 1 x Per Day/30 Days Discharge  Instructions: May shower and wash wound with dial antibacterial soap and water prior to dressing change. Cleanser: Wound Cleanser 1 x Per Day/30 Days Discharge Instructions: Cleanse the wound with wound cleanser prior to applying a clean dressing using gauze sponges, not tissue or cotton balls. Peri-Wound Care: Sween Lotion (Moisturizing lotion) 1 x Per Day/30 Days Discharge Instructions: Apply moisturizing lotion as directed Topical: Betadine 1 x Per Day/30 Days Discharge Instructions: Paint on wounds. Secondary Dressing: Woven Gauze Sponge, Non-Sterile 4x4 in 1 x Per Day/30 Days Discharge Instructions: can use gauze between toes (if no desired) Secured With: Elastic Bandage 4 inch (ACE bandage) 1 x Per Day/30 Days Discharge Instructions: Secure with ACE bandage as directed. Secured With: [employer], 4.5x3.1 (in/yd) 1 x Per Day/30 Days [place], Ebelyn ([id number]) [id number] Page 17 of 19 Discharge Instructions: Secure with Kerlix as directed. Com pression Wrap: tubigrip 1 x Per Day/30 Days WOUND #9: - T Great Wound Laterality: Left, Lateral oe Cleanser: Soap and Water 1 x Per Day/30 Days Discharge Instructions: May shower and wash wound with dial antibacterial soap and water prior to dressing change. Cleanser: Wound Cleanser 1 x Per Day/30 Days Discharge Instructions: Cleanse the wound with wound cleanser prior to applying a clean dressing using gauze sponges, not tissue or cotton balls. Peri-Wound Care: Sween Lotion (Moisturizing lotion) 1 x Per Day/30 Days Discharge Instructions: Apply moisturizing lotion as directed Topical: Betadine 1 x Per Day/30 Days Discharge Instructions: Paint on wounds. Secondary Dressing: Woven Gauze Sponge, Non-Sterile 4x4 in 1 x Per Day/30 Days Discharge Instructions: can use gauze between toes (if no desired) Secured With: Elastic Bandage 4 inch (ACE bandage) 1 x Per Day/30 Days Discharge Instructions: Secure with ACE  bandage as directed. Secured With: [employer], 4.5x3.1 (in/yd) 1 x Per Day/30 Days Discharge Instructions: Secure with Kerlix as directed. Com pression Wrap: tubigrip 1 x Per Day/30 Days 02/12/2023: Once again, her dressings were very gooey and had an odor. There has been more moisture-related tissue breakdown on much of her foot, all of the dorsal wounds look better with decreased measurements and less slough accumulation. She does have an appointment with Dr. Lenell Antu coming up to discuss potential amputation. I used scissors and forceps to trim away loose skin and then a curette to debride slough away from each of the open wound sites. I think at this point, we need to look at trying to dry things out. Although it is not typically used in the wound care center, I am going to try painting her wounds with Betadine in an effort to decrease the moisture related breakdown. We will also have this done on a daily basis at her care facility. We will use Tubigrip for compression on the leg. I have also sent in a prescription for levofloxacin due to the odor and blue-green drainage on her dressings. Electronic Signature(s) Signed: 02/12/2023 10:04:48 AM By: [name] MD FACS Previous Signature: 02/12/2023 10:01:51 AM Version By: [name] MD FACS Entered By: Lady Gary,  Danika Kluender on 02/12/2023 10:04:48 -------------------------------------------------------------------------------- HxROS Details Patient Name: Date of Service: [name] 02/12/2023 9:15 A M Medical Record Number: [id number] Patient Account Number: [id number] Date of Birth/Sex: Treating RN: 1940-05-14 (82 y.o. 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 Endocrine Medical History: Negative for: Type I Diabetes; Type II Diabetes Past Medical History Notes: hypothyroidism Musculoskeletal Medical History: Positive for: Osteoarthritis Past Medical History NotesDALAYNA, MATHUR ([id number]) [id number] Page 18 of 19 crest syndrome Neurologic Medical History: Past Medical History Notes: stroke Immunizations Pneumococcal Vaccine: Received Pneumococcal Vaccination: Yes Received Pneumococcal Vaccination On or After 60th Birthday: Yes Implantable Devices None Hospitalization / Surgery History Type of Hospitalization/Surgery cholecystectomy leg surgery tonsillectomy Family and Social History Unknown History: Yes; Former smoker - smoked when she was young; Marital Status - Widowed; Alcohol Use: Never; Drug Use: No History; Caffeine Use: Daily; Financial Concerns: No; Food, Clothing or Shelter Needs: No; Support System Lacking: No; Transportation Concerns: No Electronic Signature(s) Signed: 02/12/2023 10:55:53 AM By: [name] MD FACS Entered By: [name] on 02/12/2023 09:59:18 -------------------------------------------------------------------------------- SuperBill Details Patient Name: Date of Service: [name], [facility] RO N 02/12/2023 Medical Record Number: [id number] Patient Account Number: [id number] Date of Birth/Sex: Treating RN: [birth year] (82 y.o. F) Primary Care Provider: [name], [facility] Other Clinician: Referring Provider: Treating Provider/Extender: [name], [facility] Weeks in Treatment: 35 Diagnosis Coding ICD-10 Codes Code Description [phone] Non-pressure chronic ulcer of other part of left foot with fat layer exposed L97.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 without evidence of necrosis L97.525 Non-pressure chronic ulcer of  other part of left foot with muscle involvement without evidence of necrosis 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 I10 Essential (primary) hypertension 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 atrial fibrillation Z79.01 Long term (current) use of anticoagulants I89.0 Lymphedema, not elsewhere classified Facility Procedures Physician Procedures : CPT4 Code Description Modifier [id number] - WC PHYS LEVEL 4 - EST PT 25 ICD-10 Diagnosis Description L97.522 Non-pressure chronic ulcer of other part of left foot with fat layer exposed L97.526 Non-pressure chronic ulcer of other part of left foot  with bone involvement without evidence of necr L97.525 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 without evidence of ne I89.0 Lymphedema, not elsewhere classified Quantity: 1 osis crosis : [id number] - WC PHYS DEBR WO ANESTH 20 SQ CM ICD-10 Diagnosis Description 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  without evidence of necr L97.525 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

## 2023-02-25 NOTE — Progress Notes (Unsigned)
[facility] [place]  ASSESSMENT / PLAN: [name] is a 82 y.o. female with atherosclerosis of native arteries of left lower extremity causing ulceration.  Recommend:  Abstinence from all tobacco products. Blood glucose control with goal A1c < 7%. Blood pressure control with goal blood pressure < 140/90 mmHg. Lipid reduction therapy with goal LDL-C <100 mg/dL  Aspirin 81mg  PO QD.  Atorvastatin 40-80mg  PO QD (or other "high intensity" statin therapy).  The patient is on best medical therapy for peripheral arterial disease. The patient has been counseled about the risks of tobacco use in atherosclerotic disease. The patient has been counseled to abstain from any tobacco use. An aortogram with bilateral lower extremity runoff angiography and Left lower extremity intervention and is indicated to better evaluate the patient's lower extremity circulation because of the  limb threatening nature of the patient's diagnosis. Based on the patient's clinical exam and non-invasive data, we anticipate an endovascular intervention in the femoropopliteal and tibial vessels. Stenting and/or athrectomy would be favored because of the improved primary patency of these interventions as compared to plain balloon angioplasty.  CHIEF COMPLAINT: left foot ulceration  HISTORY OF PRESENT ILLNESS: [name] is a 82 y.o. female referred to clinic for evaluation of nonhealing ulceration of the left distal foot and toes.  The patient is a nursing home resident.  She is ambulatory with assistance.  She has had a left distal, dorsal, foot ulcer present for many months.  She has seen some progress with diligent local wound care, but has never completely healed.  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   Vision abnormalities    as result of stroke   Xerophthalmia     Past Surgical History:  Procedure Laterality Date   ABDOMINAL AORTOGRAM W/LOWER EXTREMITY Left 12/14/2022   Procedure: ABDOMINAL AORTOGRAM W/LOWER EXTREMITY;  Surgeon: Leonie Douglas, MD;  Location: [facility];  Service: Cardiovascular;  Laterality: Left;   CHOLECYSTECTOMY     leg surgery     TONSILLECTOMY      Family History  Family history unknown: Yes    Social History   Socioeconomic History   Marital status: Legally Separated    Spouse name: Not on file   Number of children: 2   Years of education: 16   Highest education level: Not on file  Occupational History    Comment: retired  Tobacco Use   Smoking status: Former   Smokeless tobacco: Never  Vaping Use   Vaping status: Never Used  Substance and Sexual Activity   Alcohol use: No   Drug use: No   Sexual activity: Not on file  Other Topics Concern   Not on file  Social History Narrative   01/22/17 lives at [facility]   Social Determinants of Health   Financial Resource Strain: Not on file  Food Insecurity: No Food Insecurity (06/27/2022)   Hunger Vital Sign    Worried About Running Out of Food in the Last Year: Never true    Ran Out of Food in the Last Year: Never true  Transportation Needs: No Transportation Needs (06/27/2022)   PRAPARE - Transportation    Lack of Transportation (  Medical): No    Lack of Transportation (Non-Medical): No  Physical Activity: Not on file  Stress: Not on file  Social Connections: Not on file  Intimate Partner Violence: Not At Risk (06/27/2022)   Humiliation, Afraid, Rape, and Kick questionnaire    Fear of Current or Ex-Partner: No    Emotionally Abused: No    Physically Abused: No    Sexually Abused: No    Allergies  Allergen Reactions   Sulfa Antibiotics Other (See Comments)    Reaction     Current Outpatient Medications   Medication Sig Dispense Refill   acetaminophen (TYLENOL) 650 MG CR tablet Take 650 mg by mouth every 4 (four) hours as needed for pain.     apixaban (ELIQUIS) 2.5 MG TABS tablet Take 1 tablet (2.5 mg total) by mouth 2 (two) times daily. 60 tablet 0   aspirin EC 81 MG tablet Take 1 tablet (81 mg total) by mouth daily. 30 tablet 11   atorvastatin (LIPITOR) 20 MG tablet Take 20 mg by mouth daily.     calcium carbonate (TUMS - DOSED IN MG ELEMENTAL CALCIUM) 500 MG chewable tablet Chew 1 tablet by mouth 2 (two) times daily with a meal.     Clobetasol Propionate 0.05 % shampoo Apply 1 application  topically See admin instructions. On shower day     escitalopram (LEXAPRO) 5 MG tablet Take 2.5 mg by mouth at bedtime.     ferrous sulfate 325 (65 FE) MG tablet Take 325 mg by mouth 3 (three) times daily with meals.     furosemide (LASIX) 20 MG tablet Take 20 mg by mouth daily.     levothyroxine (SYNTHROID) 112 MCG tablet Take 112 mcg by mouth daily before breakfast.     Menthol, Topical Analgesic, (BIOFREEZE) 4 % GEL Apply 1 application topically See admin instructions. Apply to right shoulder twice a day     metoprolol tartrate (LOPRESSOR) 50 MG tablet Take 1 tablet (50 mg total) by mouth every 8 (eight) hours. 90 tablet 0   mirtazapine (REMERON) 15 MG tablet Take 15 mg by mouth at bedtime.     mupirocin ointment (BACTROBAN) 2 % Apply 1 Application topically daily. (Patient not taking: Reported on 12/12/2022) 30 g 2   omeprazole (PRILOSEC) 20 MG capsule Take 20 mg by mouth daily.     OXYGEN Inhale 2 L into the lungs 2 (two) times daily as needed (Shortness of breath and when oxygen levels drops below 92%).     polyethylene glycol (MIRALAX / GLYCOLAX) 17 g packet Take 17 g by mouth daily as needed for mild constipation or moderate constipation. 14 each 0   Probiotic Product (ACIDOPHILUS PROBIOTIC BLEND) CAPS Take 1 capsule by mouth daily.     promethazine (PHENERGAN) 12.5 MG tablet Take 12.5 mg by mouth  daily as needed for nausea or vomiting.     Propylene Glycol (SYSTANE BALANCE OP) Place 1 drop into both eyes 2 (two) times daily.     vitamin B-12 (CYANOCOBALAMIN) 500 MCG tablet Take 0.5 tablets (250 mcg total) by mouth daily. (Patient not taking: Reported on 12/12/2022) 30 tablet 0   No current facility-administered medications for this visit.    PHYSICAL EXAM There were no vitals filed for this visit.   Elderly woman in no distress In a wheelchair Right femoral pulse palpable Femoral pulse not palpable No palpable popliteal pulses bilaterally No palpable pedal pulses bilaterally Multifocal ulceration of the left distal foot and toes   PERTINENT LABORATORY  AND RADIOLOGIC DATA  Most recent CBC    Latest Ref Rng & Units 12/14/2022    9:40 AM 06/28/2022    4:31 AM 06/27/2022   11:59 AM  CBC  WBC 4.0 - 10.5 K/uL  7.4  8.9   Hemoglobin 12.0 - 15.0 g/dL 28.4  9.0  13.2   Hematocrit 36.0 - 46.0 % 37.0  30.3  32.7   Platelets 150 - 400 K/uL  210  264      Most recent CMP    Latest Ref Rng & Units 12/14/2022    9:40 AM 07/03/2022   10:16 AM 06/30/2022    4:30 AM  CMP  Glucose 70 - 99 mg/dL 89  440  102   BUN 8 - 23 mg/dL 20  23  24    Creatinine 0.44 - 1.00 mg/dL 7.25  3.66  4.40   Sodium 135 - 145 mmol/L 139  136  136   Potassium 3.5 - 5.1 mmol/L 4.2  4.0  3.8   Chloride 98 - 111 mmol/L 103  103  107   CO2 22 - 32 mmol/L  25  22   Calcium 8.9 - 10.3 mg/dL  8.0  8.2      +-------+-----------+-----------+------------+------------+  ABI/TBIToday's ABIToday's TBIPrevious ABIPrevious TBI  +-------+-----------+-----------+------------+------------+  Right 0.82       0.18                                 +-------+-----------+-----------+------------+------------+  Left  0.84                                            +-------+-----------+-----------+------------+------------+   Left: Patent lower extremity without obvious evidence of stenosis, however  portions  of this exam were limited due to bandages/wraps.    Rande Brunt. Lenell Antu, MD [facility] Phone Number: [phone] 02/25/2023 4:28 PM   Total time spent on preparing this encounter including chart review, data review, collecting history, examining the patient, coordinating care for this new patient, 60 minutes.  Portions of this report may have been transcribed using voice recognition software.  Every effort has been made to ensure accuracy; however, inadvertent computerized transcription errors may still be present.

## 2023-02-26 ENCOUNTER — Ambulatory Visit (INDEPENDENT_AMBULATORY_CARE_PROVIDER_SITE_OTHER): Payer: Medicare Other | Admitting: Vascular Surgery

## 2023-02-26 ENCOUNTER — Encounter: Payer: Self-pay | Admitting: Vascular Surgery

## 2023-02-26 VITALS — BP 111/75 | HR 90 | Temp 99.6°F | Resp 18 | Ht 69.0 in | Wt 126.4 lb

## 2023-02-26 DIAGNOSIS — I70245 Atherosclerosis of native arteries of left leg with ulceration of other part of foot: Secondary | ICD-10-CM | POA: Diagnosis not present

## 2023-03-14 ENCOUNTER — Other Ambulatory Visit: Payer: Self-pay

## 2023-03-14 DIAGNOSIS — I70245 Atherosclerosis of native arteries of left leg with ulceration of other part of foot: Secondary | ICD-10-CM

## 2023-04-09 ENCOUNTER — Ambulatory Visit (HOSPITAL_BASED_OUTPATIENT_CLINIC_OR_DEPARTMENT_OTHER): Payer: Medicare Other | Admitting: Physician Assistant

## 2023-04-29 IMAGING — DX DG CHEST 2V
2 series · 2 of 2 positions shown · non-contrast
Comparison: 07/07/2021

CLINICAL DATA: Short of breath, atrial fibrillation, weakness

EXAM:
CHEST - 2 VIEW

[chest lat]
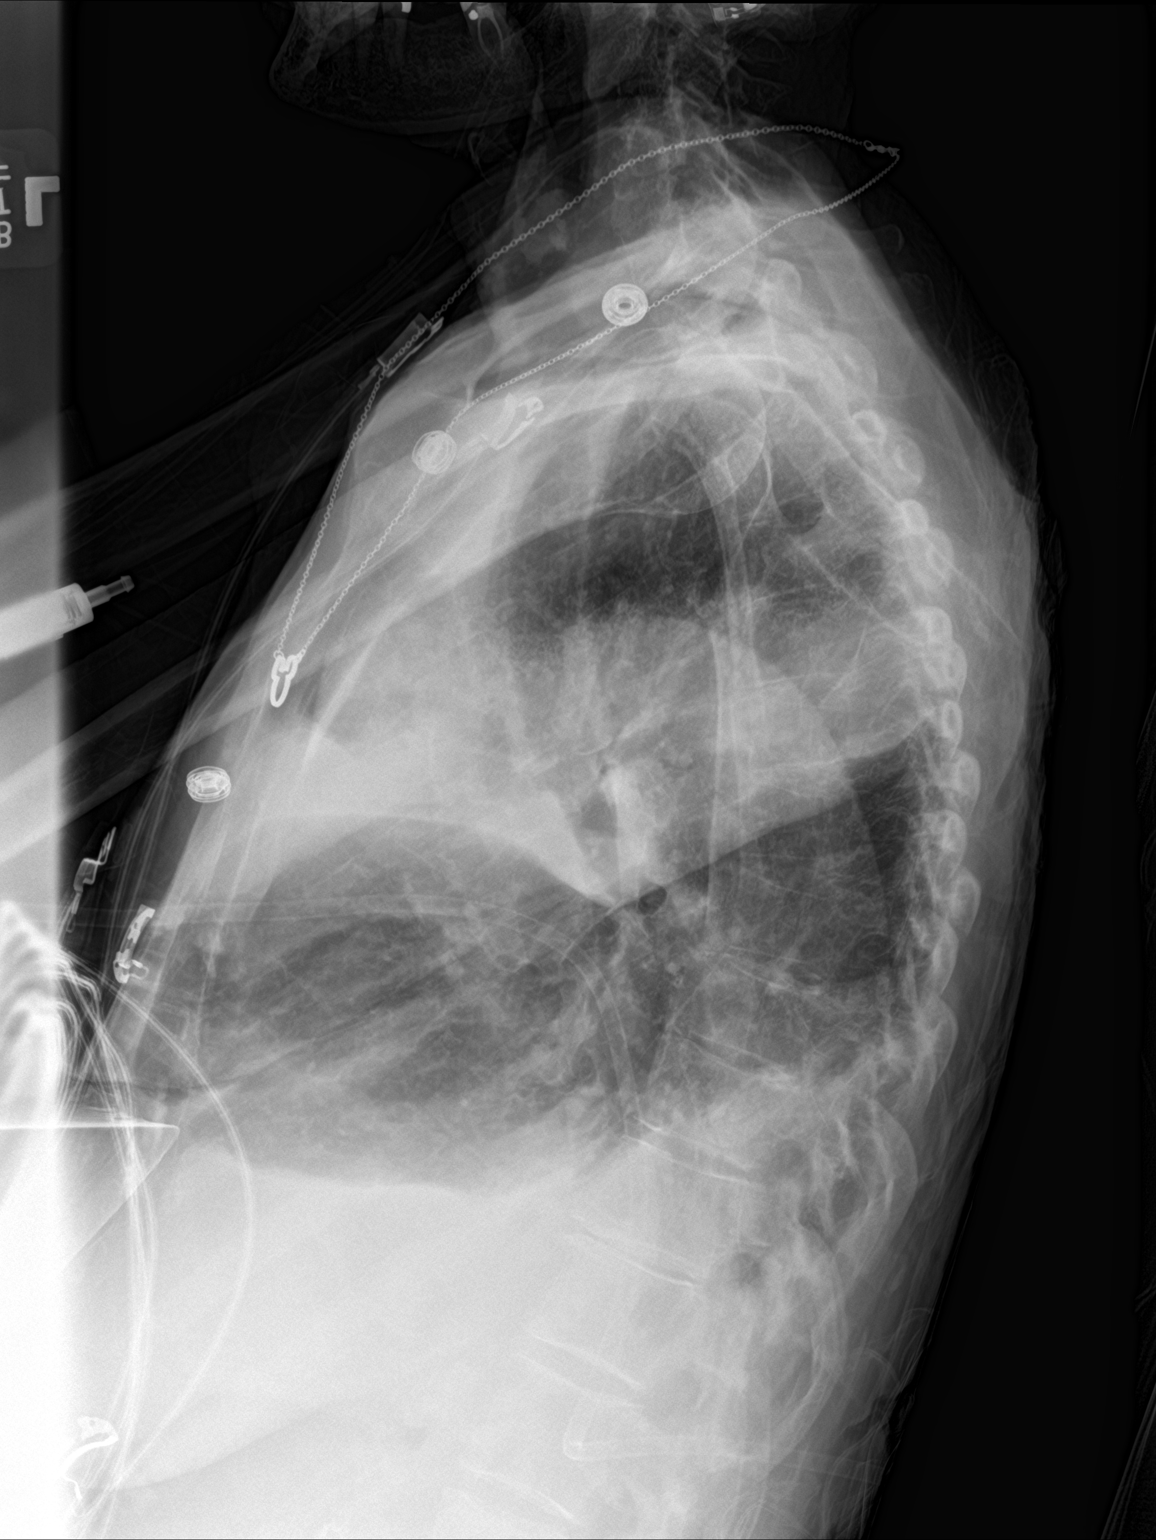

[chest ap]
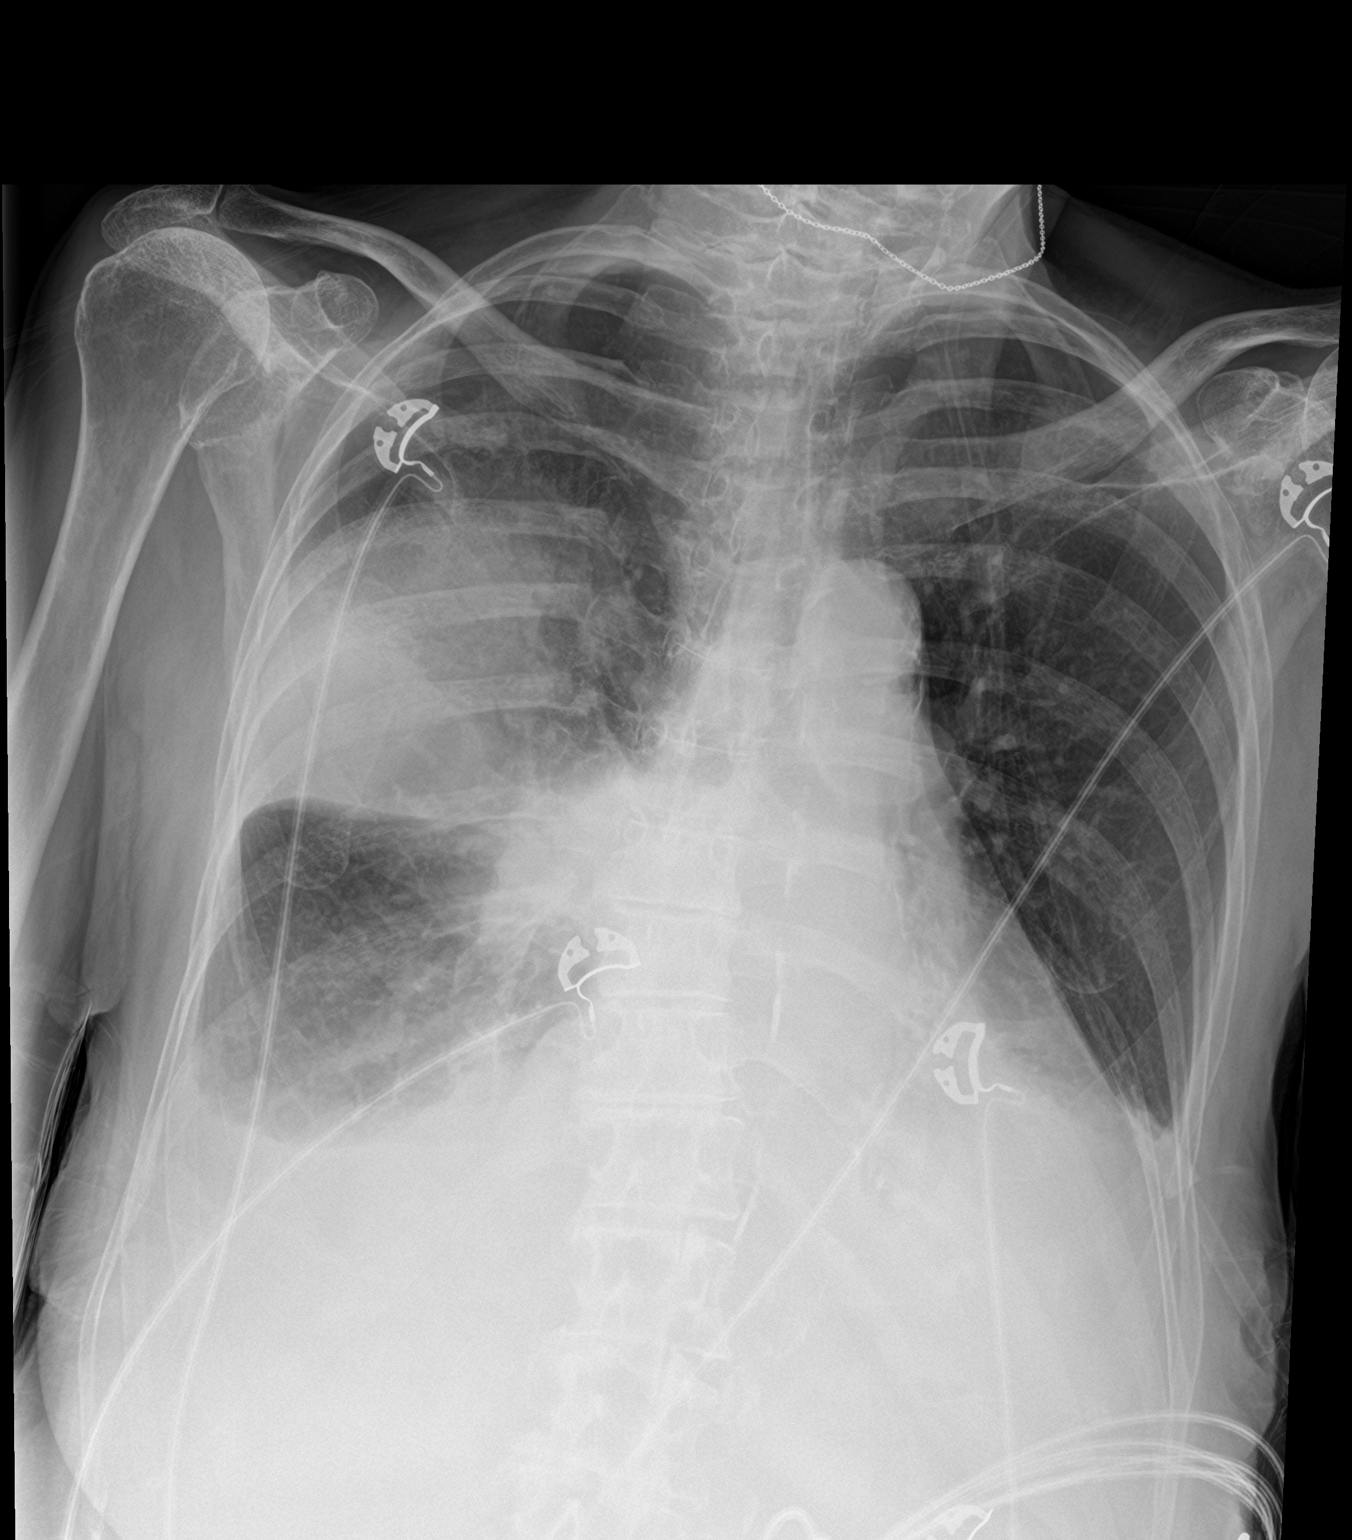

[2 of 2 positions shown; findings below may reference images not displayed]

FINDINGS: Frontal and lateral views of the chest demonstrate stable
enlargement the cardiac silhouette. Since the prior exam, dense
right upper lobe consolidation has developed. Increased density at
the right hilum, new since prior study, may reflect adenopathy.
There is progressive consolidation at the lung bases, with enlarging
small bilateral pleural effusions. No pneumothorax. No acute bony
abnormalities.
IMPRESSION: 1. Progressive multifocal bilateral airspace disease, greatest in
the right upper lobe. Findings most consistent with pneumonia.
2. Right hilar prominence may reflect reactive adenopathy.
3. Small bilateral pleural effusions, increased since prior exam.

## 2023-04-29 IMAGING — DX DG CHEST 1V PORT
1 series · 1 of 1 positions shown · non-contrast
Comparison: Chest radiograph dated 08/07/2021.

CLINICAL DATA: Shortness of breath.

EXAM:
PORTABLE CHEST 1 VIEW

[chest ap]
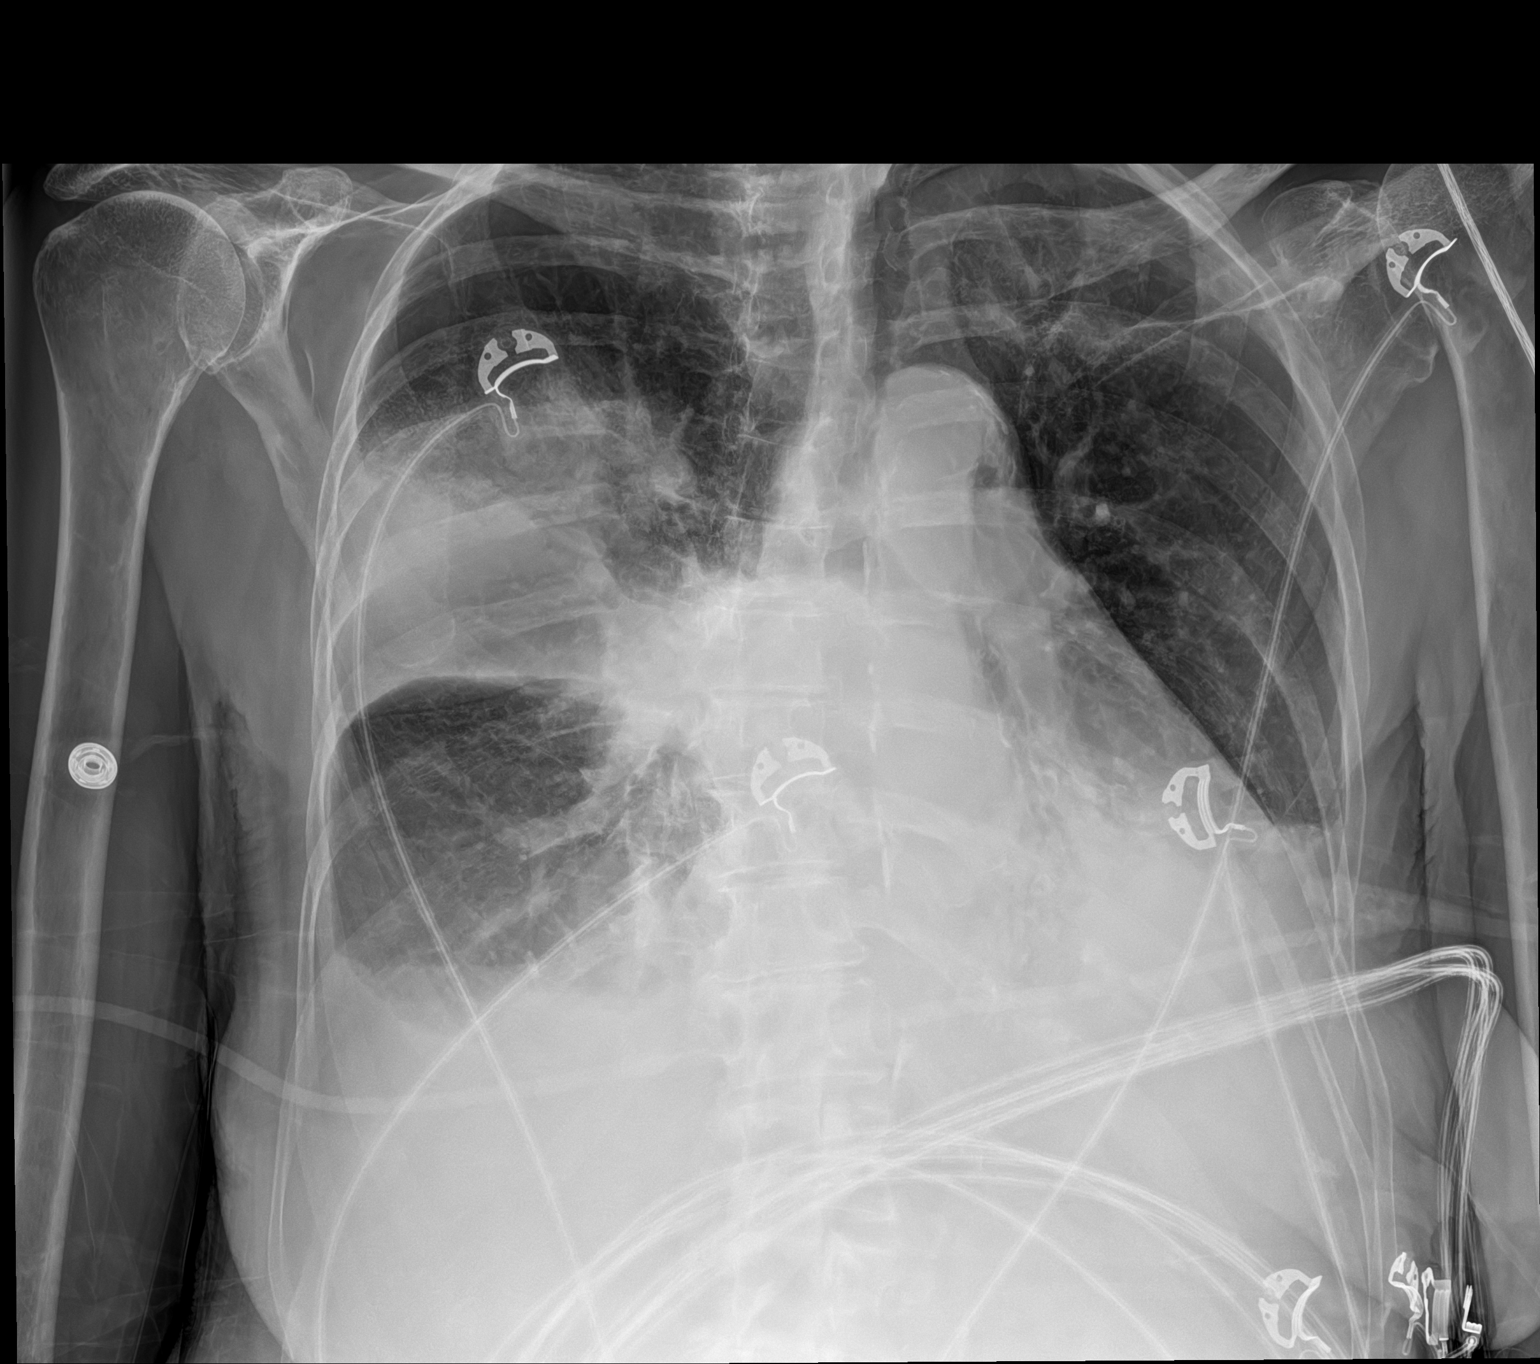

[1 of 1 positions shown; findings below may reference images not displayed]

FINDINGS: Bilateral pulmonary opacities involving the right upper lobe and
bibasilar lungs similar to earlier radiograph. Findings most
consistent with multi lobar pneumonia. Underlying mass is not
excluded clinical correlation and follow-up to resolution
recommended. Probable small bilateral pleural effusions. No
pneumothorax. Mild cardiomegaly. Atherosclerotic calcification of
the aorta. Osteopenia with degenerative changes of the spine. No
acute osseous pathology.
IMPRESSION: No significant interval change.

## 2023-04-29 NOTE — Progress Notes (Signed)
 [facility] [place]  ASSESSMENT / PLAN: [name] is a 83 y.o. female with atherosclerosis of native arteries of left lower extremity causing ulceration.  Recommend:  Abstinence from all tobacco products. Blood glucose control with goal A1c < 7%. Blood pressure control with goal blood pressure < 140/90 mmHg. Lipid reduction therapy with goal LDL-C <100 mg/dL  Aspirin  81mg  PO QD.  Atorvastatin  40-80mg  PO QD (or other high intensity statin therapy).  Patient continues to improve with local care only.  I am encouraged.  She can follow-up with me as needed.  CHIEF COMPLAINT: left foot ulceration  HISTORY OF PRESENT ILLNESS: [name] is a 83 y.o. female referred to clinic for evaluation of nonhealing ulceration of the left distal foot and toes.  The patient is a nursing home resident.  She is ambulatory with assistance.  She has had a left distal, dorsal, foot ulcer present for many months.  She has seen some progress with diligent local wound care, but has never completely healed.  02/26/2023.  Patient returns to clinic for surveillance of wound.  She is amenable to amputation at this time.  Actively her wound appears healthy and does not need major amputation.  I reviewed this with her.  She is very happy and appreciative.  We made a game plan for care going forward with her, her son, and her aide from her skilled nursing facility.  04/30/23: Patient returns to clinic for evaluation.  Her foot is doing better by report.  She just left her wound care appointment and has a pressure wrap on her foot.  I did not disturb this.  Overall she and her aide are very happy with the progression of healing.  Past Medical History:  Diagnosis Date   Anemia    Atrial fibrillation (HCC)    Chest pain syndrome    GERD (gastroesophageal reflux disease)    Hyperlipidemia    Hypertension    Hypothyroidism    Stroke (HCC) 08/2016   Vasculitis (HCC)    digital vasculitits  per nursing home fl2   Vision abnormalities    as result of stroke   Xerophthalmia     Past Surgical History:  Procedure Laterality Date   ABDOMINAL AORTOGRAM W/LOWER EXTREMITY Left 12/14/2022   Procedure: ABDOMINAL AORTOGRAM W/LOWER EXTREMITY;  Surgeon: Magda Debby SAILOR, MD;  Location: [facility];  Service: Cardiovascular;  Laterality: Left;   CHOLECYSTECTOMY     leg surgery     TONSILLECTOMY      Family History  Family history unknown: Yes    Social History   Socioeconomic History   Marital status: Legally Separated    Spouse name: Not on file   Number of children: 2   Years of education: 16   Highest education level: Not on file  Occupational History    Comment: retired  Tobacco Use   Smoking status: Former   Smokeless tobacco: Never  Vaping Use   Vaping status: Never Used  Substance and Sexual Activity   Alcohol use: No   Drug use: No   Sexual activity: Not on file  Other Topics Concern   Not on file  Social History Narrative   01/22/17 lives at [facility]   Social Drivers of Health   Financial Resource Strain: Not on file  Food Insecurity: No Food Insecurity (06/27/2022)   Hunger Vital Sign    Worried About Running Out of Food in the Last Year: Never true    Ran  Out of Food in the Last Year: Never true  Transportation Needs: No Transportation Needs (06/27/2022)   PRAPARE - Administrator, Civil Service (Medical): No    Lack of Transportation (Non-Medical): No  Physical Activity: Not on file  Stress: Not on file  Social Connections: Not on file  Intimate Partner Violence: Not At Risk (06/27/2022)   Humiliation, Afraid, Rape, and Kick questionnaire    Fear of Current or Ex-Partner: No    Emotionally Abused: No    Physically Abused: No    Sexually Abused: No    Allergies  Allergen Reactions   Sulfa Antibiotics Other (See Comments)    Reaction     Current Outpatient Medications  Medication Sig Dispense Refill   acetaminophen   (TYLENOL ) 650 MG CR tablet Take 650 mg by mouth every 4 (four) hours as needed for pain.     apixaban  (ELIQUIS ) 2.5 MG TABS tablet Take 1 tablet (2.5 mg total) by mouth 2 (two) times daily. 60 tablet 0   aspirin  EC 81 MG tablet Take 1 tablet (81 mg total) by mouth daily. 30 tablet 11   atorvastatin  (LIPITOR) 20 MG tablet Take 20 mg by mouth daily.     calcium  carbonate (TUMS - DOSED IN MG ELEMENTAL CALCIUM ) 500 MG chewable tablet Chew 1 tablet by mouth 2 (two) times daily with a meal.     Clobetasol Propionate 0.05 % shampoo Apply 1 application  topically See admin instructions. On shower day     escitalopram  (LEXAPRO ) 5 MG tablet Take 2.5 mg by mouth at bedtime.     ferrous sulfate  325 (65 FE) MG tablet Take 325 mg by mouth 3 (three) times daily with meals.     furosemide  (LASIX ) 20 MG tablet Take 20 mg by mouth daily.     levothyroxine  (SYNTHROID ) 112 MCG tablet Take 112 mcg by mouth daily before breakfast.     Menthol, Topical Analgesic, (BIOFREEZE) 4 % GEL Apply 1 application topically See admin instructions. Apply to right shoulder twice a day     metoprolol  tartrate (LOPRESSOR ) 50 MG tablet Take 1 tablet (50 mg total) by mouth every 8 (eight) hours. 90 tablet 0   mirtazapine  (REMERON ) 15 MG tablet Take 15 mg by mouth at bedtime.     mupirocin  ointment (BACTROBAN ) 2 % Apply 1 Application topically daily. 30 g 2   omeprazole (PRILOSEC) 20 MG capsule Take 20 mg by mouth daily.     OXYGEN Inhale 2 L into the lungs 2 (two) times daily as needed (Shortness of breath and when oxygen levels drops below 92%).     polyethylene glycol (MIRALAX  / GLYCOLAX ) 17 g packet Take 17 g by mouth daily as needed for mild constipation or moderate constipation. 14 each 0   Probiotic Product (ACIDOPHILUS PROBIOTIC BLEND) CAPS Take 1 capsule by mouth daily.     promethazine (PHENERGAN) 12.5 MG tablet Take 12.5 mg by mouth daily as needed for nausea or vomiting.     Propylene Glycol (SYSTANE BALANCE OP) Place 1 drop  into both eyes 2 (two) times daily.     vitamin B-12 (CYANOCOBALAMIN) 500 MCG tablet Take 0.5 tablets (250 mcg total) by mouth daily. 30 tablet 0   No current facility-administered medications for this visit.    PHYSICAL EXAM Vitals:   04/30/23 1504  BP: 120/77  Pulse: 84  Resp: 20  Temp: 98.3 F (36.8 C)      Elderly woman in no distress In a wheelchair Right  femoral pulse palpable Femoral pulse not palpable No palpable popliteal pulses bilaterally No palpable pedal pulses bilaterally  PERTINENT LABORATORY AND RADIOLOGIC DATA  Most recent CBC    Latest Ref Rng & Units 12/14/2022    9:40 AM 06/28/2022    4:31 AM 06/27/2022   11:59 AM  CBC  WBC 4.0 - 10.5 K/uL  7.4  8.9   Hemoglobin 12.0 - 15.0 g/dL 87.3  9.0  89.9   Hematocrit 36.0 - 46.0 % 37.0  30.3  32.7   Platelets 150 - 400 K/uL  210  264      Most recent CMP    Latest Ref Rng & Units 12/14/2022    9:40 AM 07/03/2022   10:16 AM 06/30/2022    4:30 AM  CMP  Glucose 70 - 99 mg/dL 89  867  872   BUN 8 - 23 mg/dL 20  23  24    Creatinine 0.44 - 1.00 mg/dL 9.09  9.23  9.19   Sodium 135 - 145 mmol/L 139  136  136   Potassium 3.5 - 5.1 mmol/L 4.2  4.0  3.8   Chloride 98 - 111 mmol/L 103  103  107   CO2 22 - 32 mmol/L  25  22   Calcium  8.9 - 10.3 mg/dL  8.0  8.2     +-------+-----------+----------------+------------+------------+  ABI/TBIToday's ABIToday's TBI     Previous ABIPrevious TBI  +-------+-----------+----------------+------------+------------+  Right [place]         unable to obtain0.82        0.18          +-------+-----------+----------------+------------+------------+  Left  1.06       unable to obtain0.84                      +-------+-----------+----------------+------------+------------+       Debby SAILOR. Magda, MD FACS [facility] Phone Number: [phone] 04/30/2023 4:02 PM   Total time spent on preparing this encounter including chart  review, data review, collecting history, examining the patient, coordinating care for this established patient, 30 minutes  Portions of this report may have been transcribed using voice recognition software.  Every effort has been made to ensure accuracy; however, inadvertent computerized transcription errors may still be present.

## 2023-04-30 ENCOUNTER — Encounter: Payer: Self-pay | Admitting: Vascular Surgery

## 2023-04-30 ENCOUNTER — Ambulatory Visit (HOSPITAL_COMMUNITY)
Admission: RE | Admit: 2023-04-30 | Discharge: 2023-04-30 | Disposition: A | Discharge status: Home / Self Care | Payer: Medicare Other | Source: Ambulatory Visit | Attending: Vascular Surgery | Admitting: Vascular Surgery

## 2023-04-30 ENCOUNTER — Ambulatory Visit (INDEPENDENT_AMBULATORY_CARE_PROVIDER_SITE_OTHER): Payer: Medicare Other | Admitting: Vascular Surgery

## 2023-04-30 VITALS — BP 120/77 | HR 84 | Temp 98.3°F | Resp 20

## 2023-04-30 DIAGNOSIS — I70245 Atherosclerosis of native arteries of left leg with ulceration of other part of foot: Secondary | ICD-10-CM

## 2023-04-30 LAB — VAS US ABI WITH/WO TBI: Left ABI: 1.06

## 2023-04-30 IMAGING — CT CT HEAD W/O CM
5 of 8 series · 17 of 47 positions shown, 18 images · non-contrast
Comparison: Head CT dated 12/09/2019.

CLINICAL DATA: Altered mental status.



[Series 2: head w o · axial · 0.44mm/px · z∈[+809,+859]mm · 2 of 32 slices shown, 3 images (1 of 2)]
[im 11/32  brain]
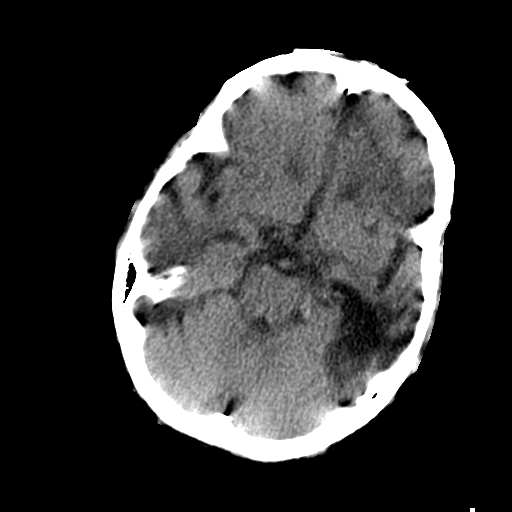
[im 11/32  bone]
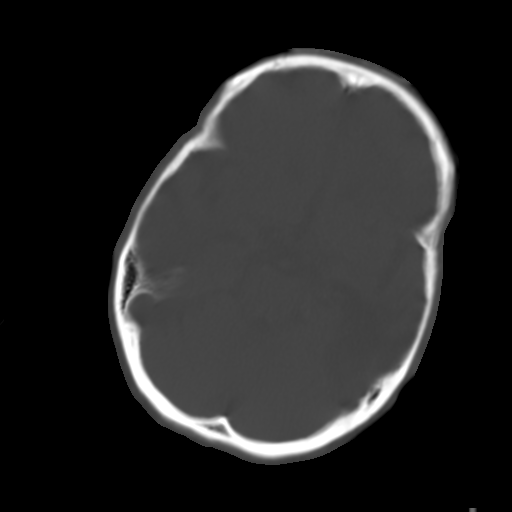
[im 21/32  brain]
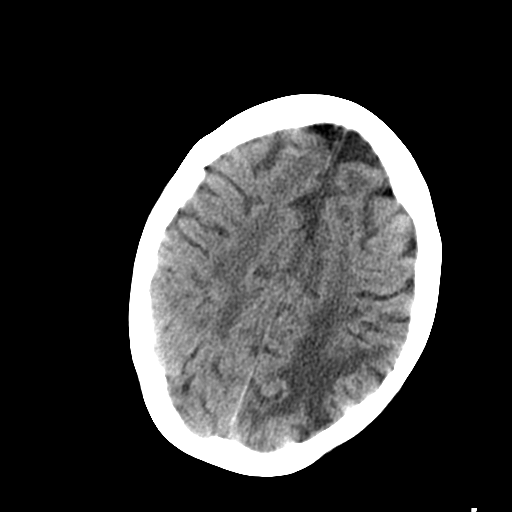

[Series 3: head bone · axial · 0.44mm/px · z∈[+773,+901]mm · 8 of 80 slices shown]
[im 8/80  bone]
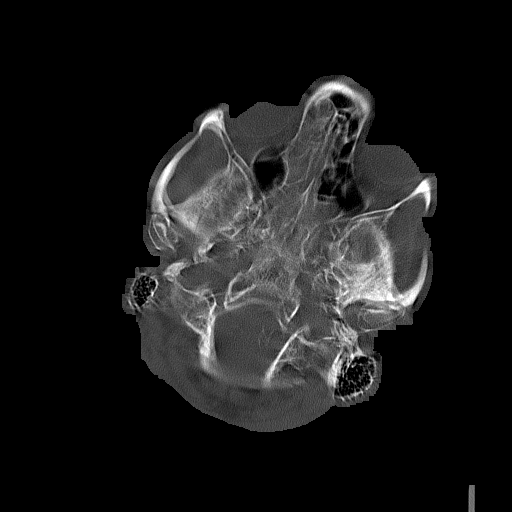
[im 16/80  bone]
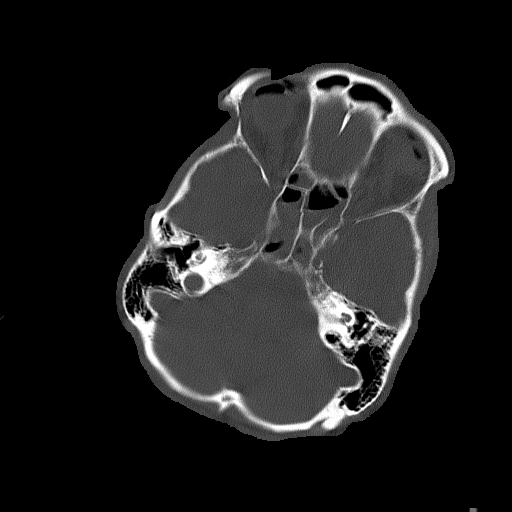
[im 24/80  bone]
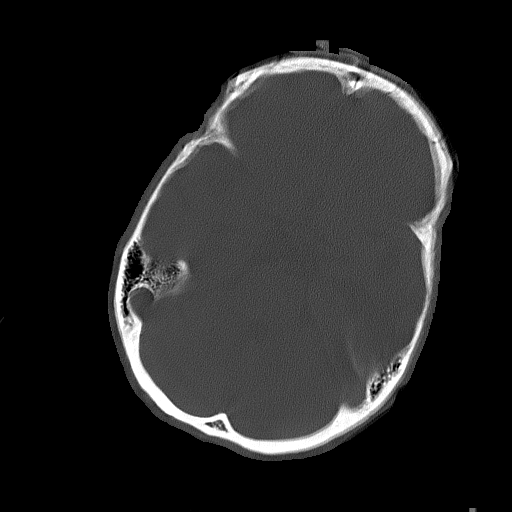
[im 32/80  bone]
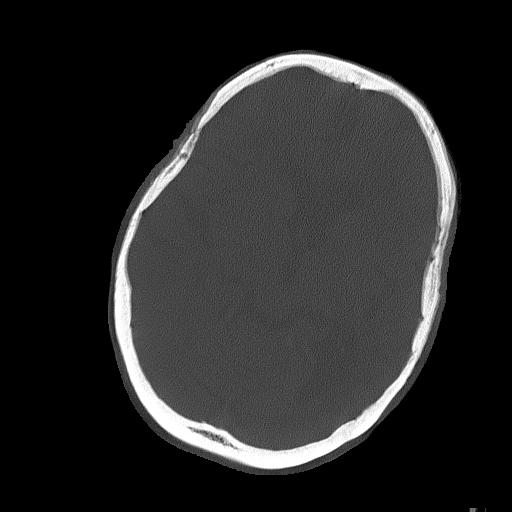
[im 48/80  bone]
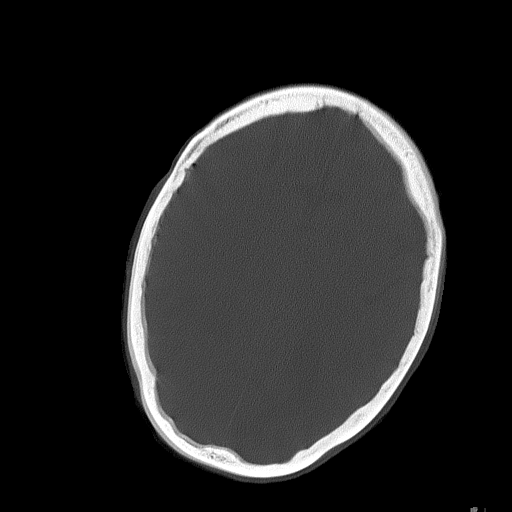
[im 56/80  bone]
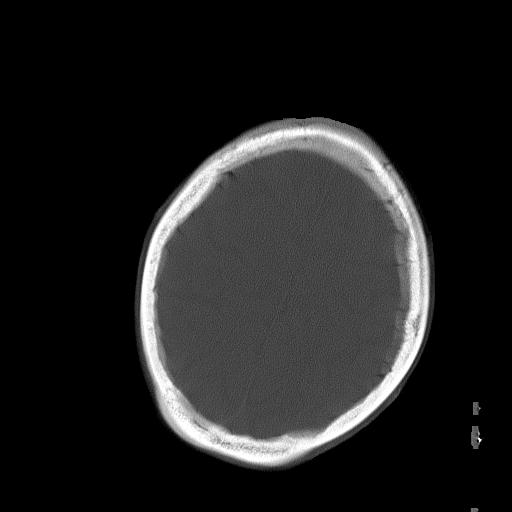
[im 64/80  bone]
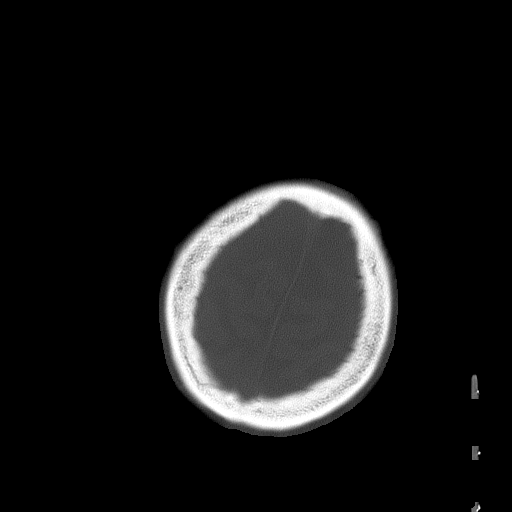
[im 72/80  bone]
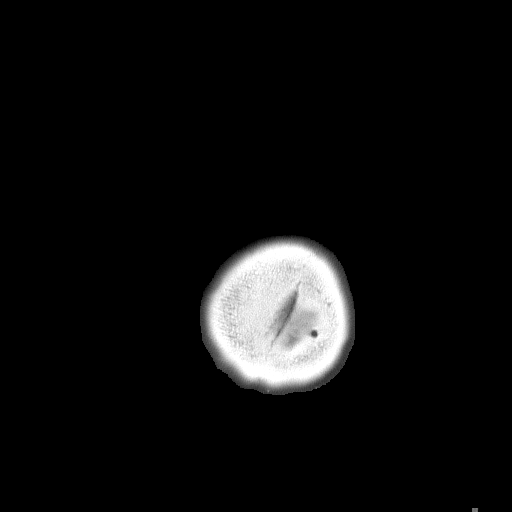

[Series 4: coronal soft · coronal · 0.29mm/px · 3 of 70 slices shown]
[im 18/70  brain]
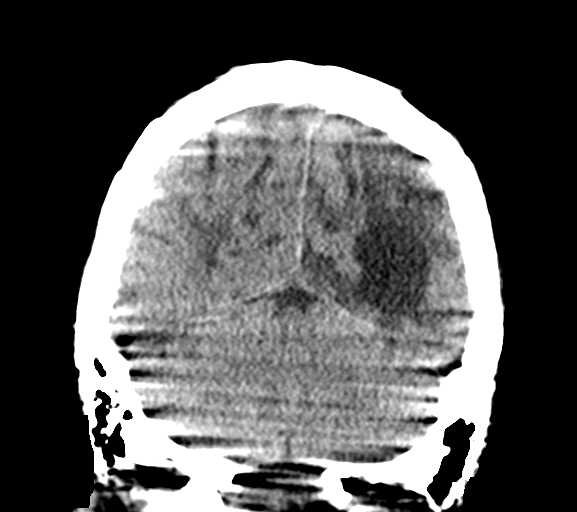
[im 35/70  brain]
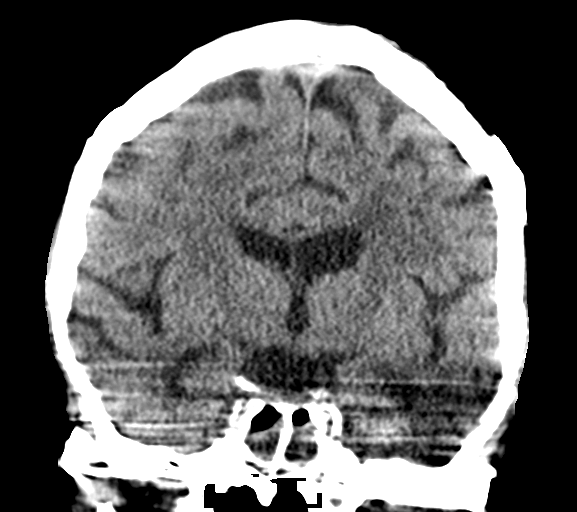
[im 52/70  brain]
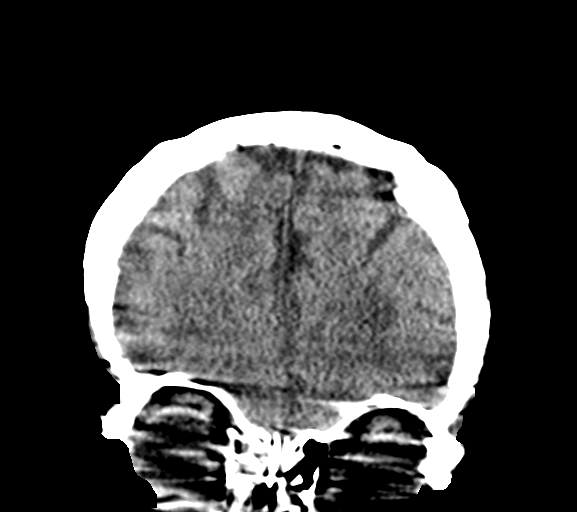

[Series 6: head w o · axial · 0.44mm/px · z∈[+809,+859]mm · 2 of 32 slices shown (2 of 2)]
[im 11/32  brain]
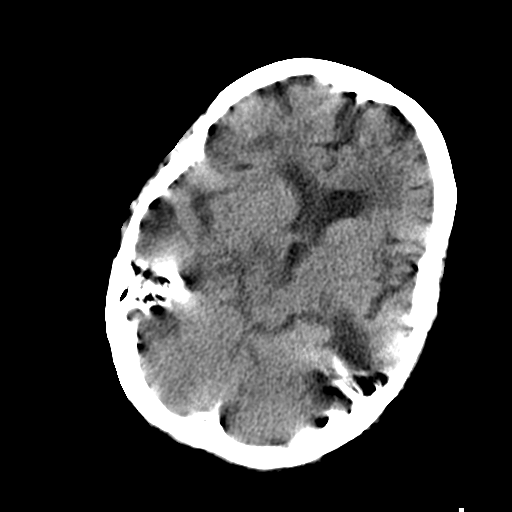
[im 21/32  brain]
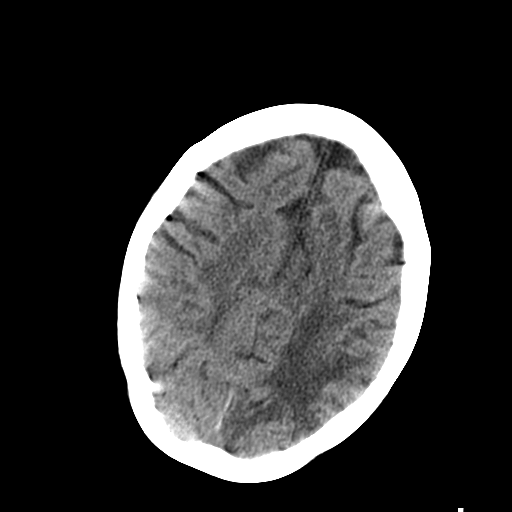

[Series 9: sagittal soft · sagittal · 0.30mm/px · 2 of 51 slices shown]
[im 17/51  brain]
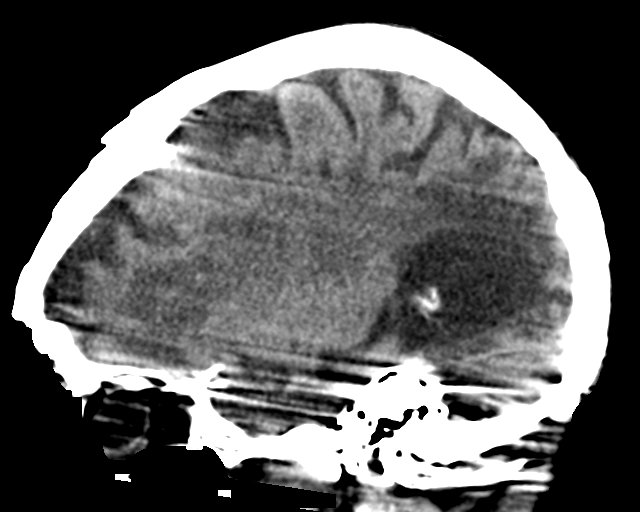
[im 34/51  brain]
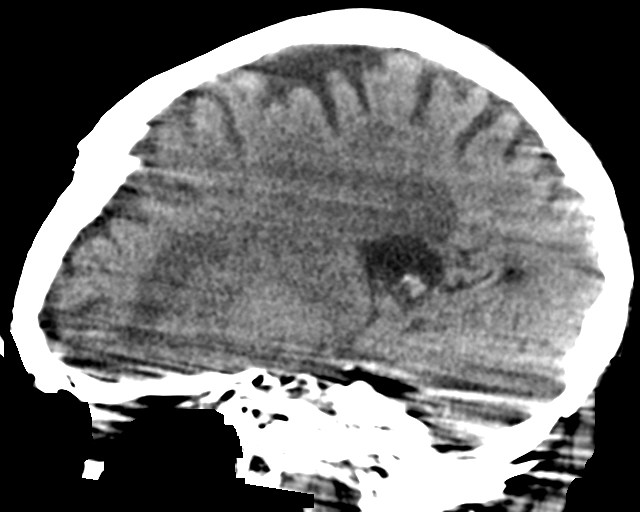

[17 of 47 positions shown; findings below may reference images not displayed]

FINDINGS: Evaluation of this exam is limited due to motion artifact.

Brain: Moderate age-related atrophy and chronic microvascular
ischemic changes. Old left temporal and occipital infarct and
encephalomalacia. No definite acute intracranial hemorrhage. No mass
effect or midline shift. No extra-axial fluid collection.

Vascular: No hyperdense vessel or unexpected calcification.

Skull: Normal. Negative for fracture or focal lesion.

Sinuses/Orbits: There is opacification of the sphenoid sinuses with
air-fluid level as well as opacification of multiple ethmoid air
cells. The mastoid air cells are clear.

Other: None
IMPRESSION: 1. No definite acute intracranial hemorrhage on motion degraded
exam.
2. Moderate age-related atrophy and chronic microvascular ischemic
changes. Old left temporal and occipital infarct and
encephalomalacia.
3. Paranasal sinus disease.

## 2023-07-30 ENCOUNTER — Emergency Department (HOSPITAL_COMMUNITY)

## 2023-07-30 ENCOUNTER — Other Ambulatory Visit: Payer: Self-pay

## 2023-07-30 ENCOUNTER — Encounter (HOSPITAL_COMMUNITY): Payer: Self-pay

## 2023-07-30 ENCOUNTER — Emergency Department (HOSPITAL_COMMUNITY)
Admission: EM | Admit: 2023-07-30 | Discharge: 2023-07-30 | Disposition: A | Discharge status: Skilled Nursing Facility | Attending: Emergency Medicine | Admitting: Emergency Medicine

## 2023-07-30 DIAGNOSIS — R600 Localized edema: Secondary | ICD-10-CM | POA: Insufficient documentation

## 2023-07-30 DIAGNOSIS — R531 Weakness: Secondary | ICD-10-CM | POA: Diagnosis present

## 2023-07-30 DIAGNOSIS — Z7982 Long term (current) use of aspirin: Secondary | ICD-10-CM | POA: Diagnosis not present

## 2023-07-30 DIAGNOSIS — R7989 Other specified abnormal findings of blood chemistry: Secondary | ICD-10-CM | POA: Diagnosis not present

## 2023-07-30 DIAGNOSIS — R0902 Hypoxemia: Secondary | ICD-10-CM | POA: Insufficient documentation

## 2023-07-30 DIAGNOSIS — Z79899 Other long term (current) drug therapy: Secondary | ICD-10-CM | POA: Diagnosis not present

## 2023-07-30 DIAGNOSIS — D72829 Elevated white blood cell count, unspecified: Secondary | ICD-10-CM | POA: Insufficient documentation

## 2023-07-30 DIAGNOSIS — Z7901 Long term (current) use of anticoagulants: Secondary | ICD-10-CM | POA: Diagnosis not present

## 2023-07-30 DIAGNOSIS — Z711 Person with feared health complaint in whom no diagnosis is made: Secondary | ICD-10-CM

## 2023-07-30 LAB — CBC WITH DIFFERENTIAL/PLATELET
Abs Immature Granulocytes: 0.21 10*3/uL — ABNORMAL HIGH (ref 0.00–0.07)
Basophils Absolute: 0.1 10*3/uL (ref 0.0–0.1)
Basophils Relative: 0 %
Eosinophils Absolute: 0.1 10*3/uL (ref 0.0–0.5)
Eosinophils Relative: 1 %
HCT: 37.9 % (ref 36.0–46.0)
Hemoglobin: 11.4 g/dL — ABNORMAL LOW (ref 12.0–15.0)
Immature Granulocytes: 2 %
Lymphocytes Relative: 15 %
Lymphs Abs: 1.9 10*3/uL (ref 0.7–4.0)
MCH: 25.9 pg — ABNORMAL LOW (ref 26.0–34.0)
MCHC: 30.1 g/dL (ref 30.0–36.0)
MCV: 86.1 fL (ref 80.0–100.0)
Monocytes Absolute: 1.3 10*3/uL — ABNORMAL HIGH (ref 0.1–1.0)
Monocytes Relative: 10 %
Neutro Abs: 9.6 10*3/uL — ABNORMAL HIGH (ref 1.7–7.7)
Neutrophils Relative %: 72 %
Platelets: 224 10*3/uL (ref 150–400)
RBC: 4.4 MIL/uL (ref 3.87–5.11)
RDW: 19.1 % — ABNORMAL HIGH (ref 11.5–15.5)
WBC: 13.2 10*3/uL — ABNORMAL HIGH (ref 4.0–10.5)
nRBC: 0 % (ref 0.0–0.2)

## 2023-07-30 LAB — COMPREHENSIVE METABOLIC PANEL WITH GFR
ALT: 11 U/L (ref 0–44)
AST: 15 U/L (ref 15–41)
Albumin: 3.5 g/dL (ref 3.5–5.0)
Alkaline Phosphatase: 81 U/L (ref 38–126)
Anion gap: 10 (ref 5–15)
BUN: 31 mg/dL — ABNORMAL HIGH (ref 8–23)
CO2: 30 mmol/L (ref 22–32)
Calcium: 8.9 mg/dL (ref 8.9–10.3)
Chloride: 99 mmol/L (ref 98–111)
Creatinine, Ser: 1.04 mg/dL — ABNORMAL HIGH (ref 0.44–1.00)
GFR, Estimated: 54 mL/min — ABNORMAL LOW (ref >60)
Glucose, Bld: 98 mg/dL (ref 70–99)
Potassium: 4 mmol/L (ref 3.5–5.1)
Sodium: 139 mmol/L (ref 135–145)
Total Bilirubin: 0.9 mg/dL (ref 0.0–1.2)
Total Protein: 6.9 g/dL (ref 6.5–8.1)

## 2023-07-30 LAB — RESP PANEL BY RT-PCR (RSV, FLU A&B, COVID)  RVPGX2
Influenza A by PCR: NEGATIVE
Influenza B by PCR: NEGATIVE
Resp Syncytial Virus by PCR: NEGATIVE
SARS Coronavirus 2 by RT PCR: NEGATIVE

## 2023-07-30 LAB — D-DIMER, QUANTITATIVE: D-Dimer, Quant: 1.5 ug{FEU}/mL — ABNORMAL HIGH (ref 0.00–0.50)

## 2023-07-30 LAB — TROPONIN I (HIGH SENSITIVITY)
Troponin I (High Sensitivity): 12 ng/L (ref <18)
Troponin I (High Sensitivity): 12 ng/L (ref <18)

## 2023-07-30 LAB — BRAIN NATRIURETIC PEPTIDE: B Natriuretic Peptide: 147 pg/mL — ABNORMAL HIGH (ref 0.0–100.0)

## 2023-07-30 MED ORDER — IOHEXOL 350 MG/ML SOLN
75.0000 mL | Freq: Once | INTRAVENOUS | Status: AC | PRN
  Administered 2023-07-30: 75 mL via INTRAVENOUS

## 2023-07-30 NOTE — Discharge Instructions (Signed)
 Please adhere to a thickened liquids diet until evaluated by gastroenterology.  Please call to make an appointment with gastroenterology for reevaluation.  Return to emergency department immediately for any new or worsening symptoms.

## 2023-07-30 NOTE — ED Notes (Signed)
 [facility] called to transport patient back to [facility] nursing facility. Nurse aware

## 2023-07-30 NOTE — ED Provider Notes (Signed)
 [place] EMERGENCY DEPARTMENT AT [facility] Provider Note   CSN: [id number] Arrival date & time: 07/30/23  1002     History  Chief Complaint  Patient presents with   Respiratory Distress    EMS was called to [facility] for c/o low oxygen sats. Per report, O2 sats dropped to 78% but would recover. Initially on 4L [place] when EMS arrived. Oxygen removed and patient oxygen level mid to upper 90s.    [name] is a 83 y.o. female.  Patient is an 83 year old female who presents to the emergency department by EMS secondary to low oxygen saturations.  Patient was noted to be 78% by EMS and was placed on 4 L nasal cannula.  Upon arrival patient was taken off of the oxygen and has been satting in the upper 90s to 100%.  Patient has no active complaints at this time.  She denies any chest pain or shortness of breath.  She does note that she has had some mild increased weakness.  She denies any abdominal pain, nausea, vomiting, diarrhea.        Home Medications Prior to Admission medications   Medication Sig Start Date End Date Taking? Authorizing Provider  acetaminophen (TYLENOL) 650 MG CR tablet Take 650 mg by mouth every 4 (four) hours as needed for pain.    [provider]  apixaban (ELIQUIS) 2.5 MG TABS tablet Take 1 tablet (2.5 mg total) by mouth 2 (two) times daily. 07/03/22   Regalado, Jon Billings A, MD  aspirin EC 81 MG tablet Take 1 tablet (81 mg total) by mouth daily. 07/03/22   Regalado, Belkys A, MD  atorvastatin (LIPITOR) 20 MG tablet Take 20 mg by mouth daily.    [provider]  calcium carbonate (TUMS - DOSED IN MG ELEMENTAL CALCIUM) 500 MG chewable tablet Chew 1 tablet by mouth 2 (two) times daily with a meal.    [provider]  Clobetasol Propionate 0.05 % shampoo Apply 1 application  topically See admin instructions. On shower day 11/06/22   [provider]  escitalopram (LEXAPRO) 5 MG tablet Take 2.5 mg by mouth at bedtime.     [provider]  ferrous sulfate 325 (65 FE) MG tablet Take 325 mg by mouth 3 (three) times daily with meals.    [provider]  furosemide (LASIX) 20 MG tablet Take 20 mg by mouth daily. 07/25/22   [provider]  levothyroxine (SYNTHROID) 112 MCG tablet Take 112 mcg by mouth daily before breakfast.    [provider]  Menthol, Topical Analgesic, (BIOFREEZE) 4 % GEL Apply 1 application topically See admin instructions. Apply to right shoulder twice a day    [provider]  metoprolol tartrate (LOPRESSOR) 50 MG tablet Take 1 tablet (50 mg total) by mouth every 8 (eight) hours. 07/03/22   Regalado, Belkys A, MD  mirtazapine (REMERON) 15 MG tablet Take 15 mg by mouth at bedtime.    [provider]  mupirocin ointment (BACTROBAN) 2 % Apply 1 Application topically daily. 05/02/22   McDonald, Rachelle Hora, DPM  omeprazole (PRILOSEC) 20 MG capsule Take 20 mg by mouth daily.    [provider]  OXYGEN Inhale 2 L into the lungs 2 (two) times daily as needed (Shortness of breath and when oxygen levels drops below 92%).    [provider]  polyethylene glycol (MIRALAX / GLYCOLAX) 17 g packet Take 17 g by mouth daily as needed for mild constipation or moderate  constipation. 11/16/21   Pappayliou, Santina Evans A, DO  Probiotic Product (ACIDOPHILUS PROBIOTIC BLEND) CAPS Take 1 capsule by mouth daily. 09/17/22   [provider]  promethazine (PHENERGAN) 12.5 MG tablet Take 12.5 mg by mouth daily as needed for nausea or vomiting.    [provider]  Propylene Glycol (SYSTANE BALANCE OP) Place 1 drop into both eyes 2 (two) times daily.    [provider]  vitamin B-12 (CYANOCOBALAMIN) 500 MCG tablet Take 0.5 tablets (250 mcg total) by mouth daily. 07/03/22   Regalado, Jon Billings A, MD      Allergies    Sulfa antibiotics    Review of Systems   Review of Systems  Respiratory:  Negative for shortness of breath.   Cardiovascular:   Negative for chest pain and leg swelling.    Physical Exam Updated Vital Signs BP 123/86 (BP Location: Right Arm)   Pulse 96   Temp 98 F (36.7 C) (Oral)   SpO2 100%  Physical Exam Vitals and nursing note reviewed.  Constitutional:      Appearance: Normal appearance.  HENT:     Head: Normocephalic and atraumatic.     Nose: Nose normal.     Mouth/Throat:     Mouth: Mucous membranes are moist.  Eyes:     Extraocular Movements: Extraocular movements intact.     Conjunctiva/sclera: Conjunctivae normal.     Pupils: Pupils are equal, round, and reactive to light.  Cardiovascular:     Rate and Rhythm: Normal rate and regular rhythm.     Pulses: Normal pulses.     Heart sounds: Normal heart sounds. No murmur heard.    No gallop.  Pulmonary:     Effort: Pulmonary effort is normal. No respiratory distress.     Breath sounds: Normal breath sounds. No stridor. No wheezing, rhonchi or rales.  Abdominal:     General: Abdomen is flat. Bowel sounds are normal. There is no distension.     Palpations: Abdomen is soft.     Tenderness: There is no abdominal tenderness. There is no guarding.  Musculoskeletal:        General: Normal range of motion.     Cervical back: Normal range of motion and neck supple.     Right lower leg: Edema present.     Left lower leg: Edema present.  Skin:    General: Skin is warm and dry.  Neurological:     General: No focal deficit present.     Mental Status: She is alert and oriented to person, place, and time. Mental status is at baseline.  Psychiatric:        Mood and Affect: Mood normal.        Behavior: Behavior normal.        Thought Content: Thought content normal.        Judgment: Judgment normal.     ED Results / Procedures / Treatments   Labs (all labs ordered are listed, but only abnormal results are displayed) Labs Reviewed  RESP PANEL BY RT-PCR (RSV, FLU A&B, COVID)  [id number]  COMPREHENSIVE METABOLIC PANEL WITH GFR  CBC WITH  DIFFERENTIAL/PLATELET  D-DIMER, QUANTITATIVE  TROPONIN I (HIGH SENSITIVITY)    EKG None  Radiology No results found.  Procedures Procedures    Medications Ordered in ED Medications - No data to display  ED Course/ Medical Decision Making/ A&P  Medical Decision Making Amount and/or Complexity of Data Reviewed Labs: ordered. Radiology: ordered.  Risk Prescription drug management.   This patient presents to the ED for concern of low oxygen differential diagnosis includes ACS, pulmonary embolus, pneumonia, aspiration, sepsis, pneumothorax, hemothorax    Additional history obtained:  Additional history obtained from medical records External records from outside source obtained and reviewed including none   Lab Tests:  I Ordered, and personally interpreted labs.  The pertinent results include: Mild leukocytosis, elevated BNP, elevated D-dimer, normal serial troponins, negative viral swab, unremarkable creatinine, mild anemia   Imaging Studies ordered:  I ordered imaging studies including chest x-ray, CTA of the chest I independently visualized and interpreted imaging which showed unremarkable chest x-ray, CTA with no pulmonary embolus, material within the esophagus I agree with the radiologist interpretation   Problem List / ED Course:  Patient is doing well at this time and is stable for discharge home.  Discussed with patient that workup has been overall unremarkable in the emergency department.  She has had no associated hypoxia while in the emergency department.  CTA of the chest demonstrated no indication of pulmonary embolus.  Did discuss the findings of the esophagus with Dr. Levon Hedger who notes the patient can follow-up on an outpatient basis with GI.  Will recommend a thickened liquid diet at this time.  Patient has no indication for pneumonia.  She has no acute complaints at the bedside at this point.  Did discuss findings with  son as well as the need for follow-up with gastroenterology.  He has voiced understanding to the plan.  Strict return precautions were provided for any new or worsening symptoms.   Social Determinants of Health:  Lives at assisted living facility           Final Clinical Impression(s) / ED Diagnoses Final diagnoses:  None    Rx / DC Orders ED Discharge Orders     None         Kathlen Mody 07/30/23 1507    Bethann Berkshire, MD 07/31/23 [phone]

## 2023-07-30 NOTE — ED Triage Notes (Signed)
 Pt arrived via [facility] for c/o low oxygen levels. Reported to be as low as 78%. Pt was on 4L when EMS arrived, sats 100%. Oxygen removed and patient ambulated to stretcher with assistance on RA and maintained sats of 97%. Currently 100% on RA here.

## 2023-07-30 NOTE — ED Notes (Signed)
 [facility] called to transport pt at this time back to facility.

## 2023-08-27 ENCOUNTER — Telehealth (INDEPENDENT_AMBULATORY_CARE_PROVIDER_SITE_OTHER): Payer: Self-pay | Admitting: Gastroenterology

## 2023-08-27 ENCOUNTER — Ambulatory Visit (INDEPENDENT_AMBULATORY_CARE_PROVIDER_SITE_OTHER): Admitting: Gastroenterology

## 2023-08-27 VITALS — BP 132/81 | HR 111 | Temp 98.5°F | Ht 67.0 in | Wt 139.7 lb

## 2023-08-27 DIAGNOSIS — R1310 Dysphagia, unspecified: Secondary | ICD-10-CM | POA: Diagnosis not present

## 2023-08-27 DIAGNOSIS — R933 Abnormal findings on diagnostic imaging of other parts of digestive tract: Secondary | ICD-10-CM | POA: Diagnosis not present

## 2023-08-27 NOTE — Telephone Encounter (Signed)
    08/27/23  Lealer Rabon [birth year]  What type of surgery is being performed? EGD +/- dilation  When is surgery scheduled? TBD  What type of clearance is required (medical or pharmacy to hold medication or both? Pharmacy to hold med  Are there any medications that need to be held prior to surgery and how long? Clearance to hold Eliquis  for 2 days prior  Name of physician performing surgery?  Dr. Lorie Rook [facility] at [facility] Phone: [phone] Fax: [phone]  Anethesia type (none, local, MAC, general)? Choice

## 2023-08-27 NOTE — Progress Notes (Signed)
 Referring Provider: Heron Lord, FNP Primary Care Physician:  Heron Lord, FNP Primary GI Physician: New   Chief Complaint  Patient presents with   Abnormal CT    Pt arrives due to findings on abnormal CT that was done on 07/30/23 at ER. Pt state she does have some issues swallowing.    HPI:   [name] is a 83 y.o. female with past medical history of anemia, GERD, HLD, HTN, hypothyroidism, vasculitis   Patient presenting today for:  abnormal ct of esophagus, dysphagia   Recent ED visit on 4/8 after presenting with low O2 sats. 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 No worse since onset. Has some discomfort at times when food stops. No episodes of food impaction. Feels like she can usually get foods to pass. No heartburn or acid reflux symptoms. Denies changes in appetite or weight loss. Maintained on omeprazole 20mg  daily. Denies rectal bleeding or melena.   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 60 tablet 0   atorvastatin  (LIPITOR) 20 MG tablet Take 20 mg by mouth daily.     calcium  carbonate (TUMS - DOSED IN MG ELEMENTAL CALCIUM ) 500 MG chewable tablet Chew 1 tablet by mouth 2 (two) times daily with a meal.     Clobetasol Propionate 0.05 % shampoo Apply 1 application  topically See admin instructions. On shower day     escitalopram  (LEXAPRO ) 5 MG tablet Take 2.5 mg by mouth at bedtime.     ferrous sulfate  325 (65 FE) MG tablet Take 325 mg by mouth 3 (three) times daily with meals.     furosemide (LASIX) 20 MG tablet Take 20 mg by mouth daily.     levothyroxine  (SYNTHROID ) 112 MCG tablet Take 112 mcg by mouth daily before breakfast.     metoprolol  tartrate (LOPRESSOR ) 50 MG tablet Take 1 tablet (50 mg total) by mouth every 8 (eight) hours. 90 tablet 0   mirtazapine  (REMERON ) 15 MG tablet Take 15 mg by mouth at bedtime.     omeprazole (PRILOSEC) 20 MG capsule Take 20 mg by mouth daily.     polyethylene glycol (MIRALAX  / GLYCOLAX ) 17 g packet Take 17 g by mouth daily as needed for mild constipation or moderate constipation. 14 each 0   Probiotic Product (ACIDOPHILUS PROBIOTIC BLEND) CAPS Take 1 capsule by mouth daily.  promethazine (PHENERGAN) 12.5 MG tablet Take 12.5 mg by mouth daily as needed for nausea or vomiting.     Propylene Glycol (SYSTANE BALANCE OP) Place 1 drop into both eyes 2 (two) times daily.     Salicylic Acid 50 % SOLN by Does not apply route.     acetaminophen  (TYLENOL ) 650 MG CR tablet Take 650 mg by mouth every 4 (four) hours as needed for pain. (Patient not taking: Reported on 08/27/2023)     aspirin  EC 81 MG tablet Take 1 tablet (81 mg total) by mouth daily. (Patient not taking: Reported on 08/27/2023) 30 tablet 11   Menthol, Topical Analgesic, (BIOFREEZE) 4 % GEL Apply 1 application topically See admin instructions. Apply to right shoulder twice a day (Patient not taking: Reported on 08/27/2023)      mupirocin  ointment (BACTROBAN ) 2 % Apply 1 Application topically daily. (Patient not taking: Reported on 08/27/2023) 30 g 2   OXYGEN Inhale 2 L into the lungs 2 (two) times daily as needed (Shortness of breath and when oxygen levels drops below 92%). (Patient not taking: Reported on 08/27/2023)     vitamin B-12 (CYANOCOBALAMIN) 500 MCG tablet Take 0.5 tablets (250 mcg total) by mouth daily. (Patient not taking: Reported on 08/27/2023) 30 tablet 0   No current facility-administered medications for this visit.    Allergies as of 08/27/2023 - Review Complete 08/27/2023  Allergen Reaction Noted   Sulfa antibiotics Other (See Comments) 01/01/2017    Social History   Socioeconomic History   Marital status: Legally Separated    Spouse name: Not on file   Number of children: 2   Years of education: 16   Highest education level: Not on file  Occupational History    Comment: retired  Tobacco Use   Smoking status: Former   Smokeless tobacco: Never  Vaping Use   Vaping status: Never Used  Substance and Sexual Activity   Alcohol use: No   Drug use: No   Sexual activity: Not on file  Other Topics Concern   Not on file  Social History Narrative   01/22/17 lives at [facility]   Social Drivers of Health   Financial Resource Strain: Not on file  Food Insecurity: No Food Insecurity (06/27/2022)   Hunger Vital Sign    Worried About Running Out of Food in the Last Year: Never true    Ran Out of Food in the Last Year: Never true  Transportation Needs: No Transportation Needs (06/27/2022)   PRAPARE - Administrator, Civil Service (Medical): No    Lack of Transportation (Non-Medical): No  Physical Activity: Not on file  Stress: Not on file  Social Connections: Not on file   Review of systems General: negative for malaise, night sweats, fever, chills, weight loss Neck: Negative for lumps, goiter, pain and significant neck swelling Resp: Negative for cough, wheezing, dyspnea at  rest CV: Negative for chest pain, leg swelling, palpitations, orthopnea GI: denies melena, hematochezia, nausea, vomiting, diarrhea, constipation, odyonophagia, early satiety or unintentional weight loss. +dysphagia  MSK: Negative for joint pain or swelling, back pain, and muscle pain. Derm: Negative for itching or rash Psych: Denies depression, anxiety, memory loss, confusion. No homicidal or suicidal ideation.  Heme: Negative for prolonged bleeding, bruising easily, and swollen nodes. Endocrine: Negative for cold or heat intolerance, polyuria, polydipsia and goiter. Neuro: negative for tremor, gait imbalance, syncope and seizures. The remainder of the review of systems is noncontributory.  Physical Exam: There were no  vitals taken for this visit. General:   Alert and oriented. No distress noted. Pleasant and cooperative.  Head:  Normocephalic and atraumatic. Eyes:  Conjuctiva clear without scleral icterus. Mouth:  Oral mucosa pink and moist. Good dentition. No lesions. Heart: Normal rate and rhythm, s1 and s2 heart sounds present.  Lungs: Clear lung sounds in all lobes. Respirations equal and unlabored. Abdomen:  +BS, soft, non-tender and non-distended. No rebound or guarding. No HSM or masses noted. Derm: No palmar erythema or jaundice Msk:  Symmetrical without gross deformities. Normal posture. Extremities:  Without edema. Neurologic:  Alert and  oriented x4 Psych:  Alert and cooperative. Normal mood and affect.  Invalid input(s): "6 MONTHS"   ASSESSMENT: Tahya Raak is a 83 y.o. female presenting today for abnormal CT and dysphagia  CT findings as above with patulous esophagus. Patient endorses more dysphagia for the past year or so. No real reflux symptoms. Given dysphagia and findings on CT, this may be related to dysmotility, though would recommend EGD to rule out anatomical causes of dysphagia such as esophageal ring, web,stricture, stenosis, esophagitis. If EGD  unremarkable, may consider BPE. Indications, risks and benefits of procedure discussed in detail with patient. Patient verbalized understanding and is in agreement to proceed with EGD.    PLAN:  -Schedule EGD, on eliquis , needs clearance to hold (ASAIII)  -Continue omeprazole 20mg  daily -chewing precautions  -consider BPE if EGD unremarkable   All questions were answered, patient verbalized understanding and is in agreement with plan as outlined above.   Follow Up: 4 months   Lucas Exline L. Oliana Gowens, MSN, APRN, AGNP-C Adult-Gerontology Nurse Practitioner [facility]

## 2023-08-27 NOTE — Patient Instructions (Addendum)
 We will get you scheduled for upper endoscopy for further evaluation of your swallowing  Continue omeprazole 20mg  daily  Make sure to avoid thicker, drier foods, take small bites, chew well and take sips of liquids between bites to avoid choking  Follow up 4 months

## 2023-08-28 NOTE — Telephone Encounter (Signed)
 Pt is going to need a new pt appt as she was last seen 2018 by cardiology. I will reach out to our scheduling team to call the pt with a new pt appt for preop clearance.   I will update the requesting office the pt will need a new pt appt.

## 2023-08-28 NOTE — Telephone Encounter (Signed)
   Name: [name]  DOB: January 01, 1941  MRN: [id number]  Primary Cardiologist: None Patient has not been seen since 2018   Chart reviewed as part of pre-operative protocol coverage. Because of [name] past medical history and time since last visit, she will require a follow-up in-office visit in order to better assess preoperative cardiovascular risk.  Pre-op covering staff: - Please schedule appointment and call patient to inform them. If patient already had an upcoming appointment within acceptable timeframe, please add "pre-op clearance" to the appointment notes so provider is aware. - Please contact requesting surgeon's office via preferred method (i.e, phone, fax) to inform them of need for appointment prior to surgery.  Per Pharmacy recommendations cannot be provided until after patient is seen in office, she has not been since since 2018.   [name], NP  08/28/2023, 2:30 PM

## 2023-08-30 NOTE — Telephone Encounter (Signed)
 2nd attempt to reach pt to schedule a NEW PT APPT FOR PREOP CLEARANCE. Pt's vm is full cannot leave message to call back.   I will send notes to requesting office to let the pt know she needs to call to schedule an IN OFFICE NEW PT APPT FOR PREOP CLEARANCE.

## 2023-09-02 ENCOUNTER — Encounter (INDEPENDENT_AMBULATORY_CARE_PROVIDER_SITE_OTHER): Payer: Self-pay | Admitting: Gastroenterology

## 2023-09-02 DIAGNOSIS — R933 Abnormal findings on diagnostic imaging of other parts of digestive tract: Secondary | ICD-10-CM | POA: Insufficient documentation

## 2023-09-02 NOTE — Telephone Encounter (Signed)
 Pt has new NEW PT APPT FOR PREOP CLEARANCE 11/21/23 with Dr. Micael Adas.

## 2023-09-02 NOTE — Telephone Encounter (Signed)
 Pt is a resident of [facility] in [place] Phone numbers: [phone] or [phone] or [phone]. Thank you!

## 2023-09-23 ENCOUNTER — Ambulatory Visit (INDEPENDENT_AMBULATORY_CARE_PROVIDER_SITE_OTHER): Admitting: Gastroenterology

## 2023-10-24 ENCOUNTER — Encounter (HOSPITAL_COMMUNITY): Payer: Self-pay

## 2023-10-24 ENCOUNTER — Inpatient Hospital Stay (HOSPITAL_COMMUNITY)
Admission: EM | Admit: 2023-10-24 | Discharge: 2023-10-27 | DRG: 389 | Disposition: A | Discharge status: Nursing Home (Includes ICF) | Source: Skilled Nursing Facility | Attending: Internal Medicine | Admitting: Internal Medicine

## 2023-10-24 ENCOUNTER — Other Ambulatory Visit: Payer: Self-pay

## 2023-10-24 DIAGNOSIS — F32A Depression, unspecified: Secondary | ICD-10-CM | POA: Diagnosis present

## 2023-10-24 DIAGNOSIS — Z9049 Acquired absence of other specified parts of digestive tract: Secondary | ICD-10-CM

## 2023-10-24 DIAGNOSIS — K219 Gastro-esophageal reflux disease without esophagitis: Secondary | ICD-10-CM | POA: Diagnosis present

## 2023-10-24 DIAGNOSIS — I4891 Unspecified atrial fibrillation: Secondary | ICD-10-CM | POA: Diagnosis present

## 2023-10-24 DIAGNOSIS — Z7982 Long term (current) use of aspirin: Secondary | ICD-10-CM

## 2023-10-24 DIAGNOSIS — Z8673 Personal history of transient ischemic attack (TIA), and cerebral infarction without residual deficits: Secondary | ICD-10-CM

## 2023-10-24 DIAGNOSIS — I10 Essential (primary) hypertension: Secondary | ICD-10-CM | POA: Diagnosis present

## 2023-10-24 DIAGNOSIS — E785 Hyperlipidemia, unspecified: Secondary | ICD-10-CM | POA: Diagnosis present

## 2023-10-24 DIAGNOSIS — Z79890 Hormone replacement therapy (postmenopausal): Secondary | ICD-10-CM

## 2023-10-24 DIAGNOSIS — Z7901 Long term (current) use of anticoagulants: Secondary | ICD-10-CM

## 2023-10-24 DIAGNOSIS — G9389 Other specified disorders of brain: Secondary | ICD-10-CM | POA: Diagnosis present

## 2023-10-24 DIAGNOSIS — K56609 Unspecified intestinal obstruction, unspecified as to partial versus complete obstruction: Secondary | ICD-10-CM | POA: Diagnosis not present

## 2023-10-24 DIAGNOSIS — Z882 Allergy status to sulfonamides status: Secondary | ICD-10-CM

## 2023-10-24 DIAGNOSIS — Z8679 Personal history of other diseases of the circulatory system: Secondary | ICD-10-CM

## 2023-10-24 DIAGNOSIS — R1310 Dysphagia, unspecified: Secondary | ICD-10-CM | POA: Diagnosis present

## 2023-10-24 DIAGNOSIS — F0393 Unspecified dementia, unspecified severity, with mood disturbance: Secondary | ICD-10-CM | POA: Diagnosis present

## 2023-10-24 DIAGNOSIS — Z79899 Other long term (current) drug therapy: Secondary | ICD-10-CM

## 2023-10-24 DIAGNOSIS — Z87891 Personal history of nicotine dependence: Secondary | ICD-10-CM

## 2023-10-24 DIAGNOSIS — R1110 Vomiting, unspecified: Secondary | ICD-10-CM | POA: Diagnosis not present

## 2023-10-24 DIAGNOSIS — E039 Hypothyroidism, unspecified: Secondary | ICD-10-CM | POA: Diagnosis present

## 2023-10-24 LAB — CBC WITH DIFFERENTIAL/PLATELET
Abs Immature Granulocytes: 0.04 10*3/uL (ref 0.00–0.07)
Basophils Absolute: 0 10*3/uL (ref 0.0–0.1)
Basophils Relative: 0 %
Eosinophils Absolute: 0 10*3/uL (ref 0.0–0.5)
Eosinophils Relative: 0 %
HCT: 37.4 % (ref 36.0–46.0)
Hemoglobin: 11.7 g/dL — ABNORMAL LOW (ref 12.0–15.0)
Immature Granulocytes: 0 %
Lymphocytes Relative: 14 %
Lymphs Abs: 1.4 10*3/uL (ref 0.7–4.0)
MCH: 26.2 pg (ref 26.0–34.0)
MCHC: 31.3 g/dL (ref 30.0–36.0)
MCV: 83.9 fL (ref 80.0–100.0)
Monocytes Absolute: 0.8 10*3/uL (ref 0.1–1.0)
Monocytes Relative: 8 %
Neutro Abs: 7.5 10*3/uL (ref 1.7–7.7)
Neutrophils Relative %: 78 %
Platelets: 279 10*3/uL (ref 150–400)
RBC: 4.46 MIL/uL (ref 3.87–5.11)
RDW: 17.5 % — ABNORMAL HIGH (ref 11.5–15.5)
WBC: 9.8 10*3/uL (ref 4.0–10.5)
nRBC: 0 % (ref 0.0–0.2)

## 2023-10-24 MED ORDER — ONDANSETRON HCL 4 MG/2ML IJ SOLN
4.0000 mg | Freq: Once | INTRAMUSCULAR | Status: AC
  Administered 2023-10-24: 4 mg via INTRAVENOUS
  Filled 2023-10-24: qty 2

## 2023-10-24 MED ORDER — LACTATED RINGERS IV BOLUS
1000.0000 mL | Freq: Once | INTRAVENOUS | Status: AC
  Administered 2023-10-24: 1000 mL via INTRAVENOUS

## 2023-10-24 NOTE — ED Triage Notes (Signed)
 Pt from [facility] where staff stated that she has had emesis x 10 today. No diarrhea. No sick contacts.

## 2023-10-25 ENCOUNTER — Emergency Department (HOSPITAL_COMMUNITY)

## 2023-10-25 ENCOUNTER — Inpatient Hospital Stay (HOSPITAL_COMMUNITY)

## 2023-10-25 DIAGNOSIS — R1310 Dysphagia, unspecified: Secondary | ICD-10-CM | POA: Diagnosis present

## 2023-10-25 DIAGNOSIS — E039 Hypothyroidism, unspecified: Secondary | ICD-10-CM

## 2023-10-25 DIAGNOSIS — Z882 Allergy status to sulfonamides status: Secondary | ICD-10-CM | POA: Diagnosis not present

## 2023-10-25 DIAGNOSIS — E785 Hyperlipidemia, unspecified: Secondary | ICD-10-CM | POA: Diagnosis present

## 2023-10-25 DIAGNOSIS — K56609 Unspecified intestinal obstruction, unspecified as to partial versus complete obstruction: Principal | ICD-10-CM | POA: Diagnosis present

## 2023-10-25 DIAGNOSIS — Z9049 Acquired absence of other specified parts of digestive tract: Secondary | ICD-10-CM | POA: Diagnosis not present

## 2023-10-25 DIAGNOSIS — I10 Essential (primary) hypertension: Secondary | ICD-10-CM | POA: Diagnosis present

## 2023-10-25 DIAGNOSIS — Z7982 Long term (current) use of aspirin: Secondary | ICD-10-CM | POA: Diagnosis not present

## 2023-10-25 DIAGNOSIS — F0393 Unspecified dementia, unspecified severity, with mood disturbance: Secondary | ICD-10-CM | POA: Diagnosis present

## 2023-10-25 DIAGNOSIS — Z7901 Long term (current) use of anticoagulants: Secondary | ICD-10-CM | POA: Diagnosis not present

## 2023-10-25 DIAGNOSIS — Z8673 Personal history of transient ischemic attack (TIA), and cerebral infarction without residual deficits: Secondary | ICD-10-CM | POA: Diagnosis not present

## 2023-10-25 DIAGNOSIS — K219 Gastro-esophageal reflux disease without esophagitis: Secondary | ICD-10-CM | POA: Diagnosis present

## 2023-10-25 DIAGNOSIS — Z87891 Personal history of nicotine dependence: Secondary | ICD-10-CM | POA: Diagnosis not present

## 2023-10-25 DIAGNOSIS — Z79899 Other long term (current) drug therapy: Secondary | ICD-10-CM | POA: Diagnosis not present

## 2023-10-25 DIAGNOSIS — Z79890 Hormone replacement therapy (postmenopausal): Secondary | ICD-10-CM | POA: Diagnosis not present

## 2023-10-25 DIAGNOSIS — I4891 Unspecified atrial fibrillation: Secondary | ICD-10-CM

## 2023-10-25 DIAGNOSIS — F32A Depression, unspecified: Secondary | ICD-10-CM | POA: Insufficient documentation

## 2023-10-25 DIAGNOSIS — G9389 Other specified disorders of brain: Secondary | ICD-10-CM | POA: Diagnosis present

## 2023-10-25 DIAGNOSIS — R1110 Vomiting, unspecified: Secondary | ICD-10-CM | POA: Diagnosis present

## 2023-10-25 LAB — COMPREHENSIVE METABOLIC PANEL WITH GFR
ALT: 11 U/L (ref 0–44)
AST: 17 U/L (ref 15–41)
Albumin: 3.5 g/dL (ref 3.5–5.0)
Alkaline Phosphatase: 109 U/L (ref 38–126)
Anion gap: 13 (ref 5–15)
BUN: 22 mg/dL (ref 8–23)
CO2: 25 mmol/L (ref 22–32)
Calcium: 9.1 mg/dL (ref 8.9–10.3)
Chloride: 100 mmol/L (ref 98–111)
Creatinine, Ser: 0.91 mg/dL (ref 0.44–1.00)
GFR, Estimated: >60 mL/min (ref >60)
Glucose, Bld: 142 mg/dL — ABNORMAL HIGH (ref 70–99)
Potassium: 3.7 mmol/L (ref 3.5–5.1)
Sodium: 138 mmol/L (ref 135–145)
Total Bilirubin: 1 mg/dL (ref 0.0–1.2)
Total Protein: 7.4 g/dL (ref 6.5–8.1)

## 2023-10-25 LAB — TSH: TSH: 2.601 u[IU]/mL (ref 0.350–4.500)

## 2023-10-25 LAB — URINALYSIS, W/ REFLEX TO CULTURE (INFECTION SUSPECTED)
Bilirubin Urine: NEGATIVE
Glucose, UA: NEGATIVE mg/dL
Ketones, ur: NEGATIVE mg/dL
Nitrite: NEGATIVE
Protein, ur: NEGATIVE mg/dL
Specific Gravity, Urine: 1.015 (ref 1.005–1.030)
pH: 7 (ref 5.0–8.0)

## 2023-10-25 LAB — LIPASE, BLOOD: Lipase: 55 U/L — ABNORMAL HIGH (ref 11–51)

## 2023-10-25 LAB — CBC
HCT: 35.5 % — ABNORMAL LOW (ref 36.0–46.0)
Hemoglobin: 11 g/dL — ABNORMAL LOW (ref 12.0–15.0)
MCH: 26.3 pg (ref 26.0–34.0)
MCHC: 31 g/dL (ref 30.0–36.0)
MCV: 84.9 fL (ref 80.0–100.0)
Platelets: 265 K/uL (ref 150–400)
RBC: 4.18 MIL/uL (ref 3.87–5.11)
RDW: 17.6 % — ABNORMAL HIGH (ref 11.5–15.5)
WBC: 8.9 K/uL (ref 4.0–10.5)
nRBC: 0 % (ref 0.0–0.2)

## 2023-10-25 LAB — BASIC METABOLIC PANEL WITH GFR
Anion gap: 7 (ref 5–15)
BUN: 20 mg/dL (ref 8–23)
CO2: 26 mmol/L (ref 22–32)
Calcium: 8.6 mg/dL — ABNORMAL LOW (ref 8.9–10.3)
Chloride: 104 mmol/L (ref 98–111)
Creatinine, Ser: 0.78 mg/dL (ref 0.44–1.00)
GFR, Estimated: >60 mL/min (ref >60)
Glucose, Bld: 116 mg/dL — ABNORMAL HIGH (ref 70–99)
Potassium: 3.8 mmol/L (ref 3.5–5.1)
Sodium: 137 mmol/L (ref 135–145)

## 2023-10-25 LAB — GLUCOSE, CAPILLARY: Glucose-Capillary: 100 mg/dL — ABNORMAL HIGH (ref 70–99)

## 2023-10-25 LAB — LACTIC ACID, PLASMA: Lactic Acid, Venous: 1.1 mmol/L (ref 0.5–1.9)

## 2023-10-25 MED ORDER — METOPROLOL TARTRATE 50 MG PO TABS
50.0000 mg | ORAL_TABLET | Freq: Three times a day (TID) | ORAL | Status: DC
  Administered 2023-10-25 – 2023-10-27 (×7): 50 mg via ORAL
  Filled 2023-10-25 (×9): qty 1

## 2023-10-25 MED ORDER — PANTOPRAZOLE SODIUM 40 MG PO TBEC
40.0000 mg | DELAYED_RELEASE_TABLET | Freq: Every day | ORAL | Status: DC
  Administered 2023-10-25 – 2023-10-27 (×3): 40 mg via ORAL
  Filled 2023-10-25 (×3): qty 1

## 2023-10-25 MED ORDER — POTASSIUM CHLORIDE IN NACL 20-0.9 MEQ/L-% IV SOLN: INTRAVENOUS | Status: AC

## 2023-10-25 MED ORDER — MAGNESIUM HYDROXIDE 400 MG/5ML PO SUSP: 30.0000 mL | Freq: Every day | ORAL | Status: DC | PRN

## 2023-10-25 MED ORDER — ONDANSETRON HCL 4 MG PO TABS: 4.0000 mg | ORAL_TABLET | Freq: Four times a day (QID) | ORAL | Status: DC | PRN

## 2023-10-25 MED ORDER — ACETAMINOPHEN 325 MG PO TABS: 650.0000 mg | ORAL_TABLET | Freq: Four times a day (QID) | ORAL | Status: DC | PRN

## 2023-10-25 MED ORDER — ACETAMINOPHEN 650 MG RE SUPP: 650.0000 mg | Freq: Four times a day (QID) | RECTAL | Status: DC | PRN

## 2023-10-25 MED ORDER — ESCITALOPRAM OXALATE 10 MG PO TABS
5.0000 mg | ORAL_TABLET | Freq: Every day | ORAL | Status: DC
  Administered 2023-10-25 – 2023-10-26 (×2): 5 mg via ORAL
  Filled 2023-10-25 (×2): qty 1

## 2023-10-25 MED ORDER — TRAZODONE HCL 50 MG PO TABS: 25.0000 mg | ORAL_TABLET | Freq: Every evening | ORAL | Status: DC | PRN

## 2023-10-25 MED ORDER — MORPHINE SULFATE (PF) 2 MG/ML IV SOLN: 2.0000 mg | INTRAVENOUS | Status: DC | PRN

## 2023-10-25 MED ORDER — ENOXAPARIN SODIUM 40 MG/0.4ML IJ SOSY
40.0000 mg | PREFILLED_SYRINGE | INTRAMUSCULAR | Status: DC
  Administered 2023-10-25 – 2023-10-27 (×3): 40 mg via SUBCUTANEOUS
  Filled 2023-10-25 (×3): qty 0.4

## 2023-10-25 MED ORDER — ESCITALOPRAM OXALATE 5 MG PO TABS
2.5000 mg | ORAL_TABLET | Freq: Every day | ORAL | Status: DC
  Filled 2023-10-25: qty 1

## 2023-10-25 MED ORDER — ATORVASTATIN CALCIUM 20 MG PO TABS
20.0000 mg | ORAL_TABLET | Freq: Every day | ORAL | Status: DC
  Administered 2023-10-25 – 2023-10-27 (×3): 20 mg via ORAL
  Filled 2023-10-25 (×3): qty 1

## 2023-10-25 MED ORDER — ONDANSETRON HCL 4 MG/2ML IJ SOLN: 4.0000 mg | Freq: Four times a day (QID) | INTRAMUSCULAR | Status: DC | PRN

## 2023-10-25 MED ORDER — LEVOTHYROXINE SODIUM 112 MCG PO TABS
112.0000 ug | ORAL_TABLET | Freq: Every day | ORAL | Status: DC
  Administered 2023-10-25 – 2023-10-27 (×3): 112 ug via ORAL
  Filled 2023-10-25 (×3): qty 1

## 2023-10-25 MED ORDER — CALCIUM CARBONATE ANTACID 500 MG PO CHEW
1.0000 | CHEWABLE_TABLET | Freq: Two times a day (BID) | ORAL | Status: DC
  Administered 2023-10-26 – 2023-10-27 (×2): 200 mg via ORAL
  Filled 2023-10-25 (×3): qty 1

## 2023-10-25 MED ORDER — FERROUS SULFATE 325 (65 FE) MG PO TABS
325.0000 mg | ORAL_TABLET | Freq: Three times a day (TID) | ORAL | Status: DC
  Administered 2023-10-25 – 2023-10-27 (×8): 325 mg via ORAL
  Filled 2023-10-25 (×7): qty 1

## 2023-10-25 MED ORDER — MIRTAZAPINE 15 MG PO TABS
15.0000 mg | ORAL_TABLET | Freq: Every day | ORAL | Status: DC
  Administered 2023-10-25 – 2023-10-26 (×2): 15 mg via ORAL
  Filled 2023-10-25 (×2): qty 1

## 2023-10-25 MED ORDER — FUROSEMIDE 20 MG PO TABS
20.0000 mg | ORAL_TABLET | Freq: Every day | ORAL | Status: DC
  Administered 2023-10-25: 20 mg via ORAL
  Filled 2023-10-25: qty 1

## 2023-10-25 MED ORDER — IOHEXOL 300 MG/ML  SOLN
100.0000 mL | Freq: Once | INTRAMUSCULAR | Status: AC | PRN
  Administered 2023-10-25: 100 mL via INTRAVENOUS

## 2023-10-25 NOTE — ED Provider Notes (Signed)
 [place] EMERGENCY DEPARTMENT AT [facility]  Provider Note  CSN: [id number] Arrival date & time: 10/24/23 2251  History Chief Complaint  Patient presents with   Emesis    [name] is a 83 y.o. female brought to the ED via EMS from [facility] for evaluation of vomiting multiple times during the day. She reports she feels fine at the time of my evaluation but cannot elaborate further.    Home Medications Prior to Admission medications   Medication Sig Start Date End Date Taking? Authorizing Provider  acetaminophen  (TYLENOL ) 650 MG CR tablet Take 650 mg by mouth every 4 (four) hours as needed for pain. Patient not taking: Reported on 08/27/2023    [provider]  apixaban  (ELIQUIS ) 2.5 MG TABS tablet Take 1 tablet (2.5 mg total) by mouth 2 (two) times daily. 07/03/22   Regalado, Belkys A, MD  aspirin  EC 81 MG tablet Take 1 tablet (81 mg total) by mouth daily. Patient not taking: Reported on 08/27/2023 07/03/22   Regalado, Owen A, MD  atorvastatin  (LIPITOR) 20 MG tablet Take 20 mg by mouth daily.    [provider]  calcium  carbonate (TUMS - DOSED IN MG ELEMENTAL CALCIUM ) 500 MG chewable tablet Chew 1 tablet by mouth 2 (two) times daily with a meal.    [provider]  Clobetasol Propionate 0.05 % shampoo Apply 1 application  topically See admin instructions. On shower day 11/06/22   [provider]  escitalopram  (LEXAPRO ) 5 MG tablet Take 2.5 mg by mouth at bedtime.    [provider]  ferrous sulfate  325 (65 FE) MG tablet Take 325 mg by mouth 3 (three) times daily with meals.    [provider]  furosemide  (LASIX ) 20 MG tablet Take 20 mg by mouth daily. 07/25/22   [provider]  levothyroxine  (SYNTHROID ) 112 MCG tablet Take 112 mcg by mouth daily before breakfast.    [provider]  Menthol, Topical Analgesic, (BIOFREEZE) 4 % GEL Apply 1 application topically See admin instructions. Apply to right shoulder  twice a day Patient not taking: Reported on 08/27/2023    [provider]  metoprolol  tartrate (LOPRESSOR ) 50 MG tablet Take 1 tablet (50 mg total) by mouth every 8 (eight) hours. 07/03/22   Regalado, Belkys A, MD  mirtazapine  (REMERON ) 15 MG tablet Take 15 mg by mouth at bedtime.    [provider]  mupirocin  ointment (BACTROBAN ) 2 % Apply 1 Application topically daily. Patient not taking: Reported on 08/27/2023 05/02/22   Silva Juliene SAUNDERS, DPM  omeprazole (PRILOSEC) 20 MG capsule Take 20 mg by mouth daily.    [provider]  OXYGEN Inhale 2 L into the lungs 2 (two) times daily as needed (Shortness of breath and when oxygen levels drops below 92%). Patient not taking: Reported on 08/27/2023    [provider]  polyethylene glycol (MIRALAX  / GLYCOLAX ) 17 g packet Take 17 g by mouth daily as needed for mild constipation or moderate constipation. 11/16/21   Pappayliou, Dorothyann A, DO  Probiotic Product (ACIDOPHILUS PROBIOTIC BLEND) CAPS Take 1 capsule by mouth daily. 09/17/22   [provider]  promethazine (PHENERGAN) 12.5 MG tablet Take 12.5 mg by mouth daily as needed for nausea or vomiting.    [provider]  Propylene Glycol (SYSTANE BALANCE OP) Place 1 drop into both eyes 2 (two) times daily.    [provider]  Salicylic Acid 50 % SOLN by Does not apply route.  [provider]  vitamin B-12 (CYANOCOBALAMIN) 500 MCG tablet Take 0.5 tablets (250 mcg total) by mouth daily. Patient not taking: Reported on 08/27/2023 07/03/22   Regalado, Belkys A, MD     Allergies    Sulfa antibiotics   Review of Systems   Review of Systems Please see HPI for pertinent positives and negatives  Physical Exam BP (!) 140/84   Pulse 90   Temp 98.2 F (36.8 C) (Oral)   Resp (!) 21   Wt 63.4 kg   SpO2 93%   BMI 21.89 kg/m   Physical Exam Vitals and nursing note reviewed.  Constitutional:      Appearance: Normal appearance.  HENT:      Head: Normocephalic and atraumatic.     Nose: Nose normal.     Mouth/Throat:     Mouth: Mucous membranes are moist.  Eyes:     Extraocular Movements: Extraocular movements intact.     Conjunctiva/sclera: Conjunctivae normal.  Cardiovascular:     Rate and Rhythm: Normal rate.  Pulmonary:     Effort: Pulmonary effort is normal.     Breath sounds: Normal breath sounds.  Abdominal:     General: Abdomen is flat.     Palpations: Abdomen is soft.     Tenderness: There is abdominal tenderness (generalized). There is no guarding.  Musculoskeletal:        General: No swelling. Normal range of motion.     Cervical back: Neck supple.     Right lower leg: No edema.     Left lower leg: No edema.  Skin:    General: Skin is warm and dry.  Neurological:     General: No focal deficit present.     Mental Status: She is alert. She is disoriented.  Psychiatric:        Mood and Affect: Mood normal.     ED Results / Procedures / Treatments   EKG EKG Interpretation Date/Time:  Friday October 25 2023 00:46:01 EDT Ventricular Rate:  83 PR Interval:    QRS Duration:  91 QT Interval:  375 QTC Calculation: 441 R Axis:   92  Text Interpretation: Atrial fibrillation Right axis deviation No significant change since last tracing Confirmed by Roselyn Dunnings [phone]) on 10/25/2023 12:54:15 AM  Procedures Procedures  Medications Ordered in the ED Medications  ondansetron  (ZOFRAN ) injection 4 mg (4 mg Intravenous Given 10/24/23 2339)  lactated ringers  bolus 1,000 mL (0 mLs Intravenous Stopped 10/25/23 0136)  iohexol  (OMNIPAQUE ) 300 MG/ML solution 100 mL (100 mLs Intravenous Contrast Given 10/25/23 0103)    Initial Impression and Plan  Patient here with multiple episodes of vomiting, no further history is available. From EMR review, she has history of stroke, afib on Eliquis , GERD. Was recently seen by GI for dysphagia but no reported episodes of impaction. Recent CT showed a patulous esophagus. She is  planned for EGD in the future. Labs done in triage show unremarkable CBC, CMP, lipase and lactic acid. Given Zofran  and IVF for comfort with apparent improvement. Given her difficulty providing history and her tenderness on exam, will send for CT to eval SBO or other causes.   ED Course   Clinical Course as of 10/25/23 0228  [place] Oct 25, 2023  0204 I personally viewed the images from radiology studies and agree with radiologist interpretation: CT concerning for SBO, cardiomegaly and effusion appear chronic. Will discuss admission with Hospitalist.  [CS]  0224 Discussed with Dr. Lawence, Hospitalist, who will evaluate for admission.  [  CS]    Clinical Course User Index [CS] Roselyn Carlin NOVAK, MD     MDM Rules/Calculators/A&P Medical Decision Making Problems Addressed: History of cardiomegaly: chronic illness or injury SBO (small bowel obstruction) (HCC): acute illness or injury  Amount and/or Complexity of Data Reviewed Labs: ordered. Decision-making details documented in ED Course. Radiology: ordered and independent interpretation performed. Decision-making details documented in ED Course. ECG/medicine tests: ordered and independent interpretation performed. Decision-making details documented in ED Course.  Risk Prescription drug management. Decision regarding hospitalization.     Final Clinical Impression(s) / ED Diagnoses Final diagnoses:  SBO (small bowel obstruction) (HCC)  History of cardiomegaly    Rx / DC Orders ED Discharge Orders     None        Roselyn Carlin NOVAK, MD 10/25/23 [phone]

## 2023-10-25 NOTE — Plan of Care (Signed)
   Problem: Activity: Goal: Risk for activity intolerance will decrease Outcome: Progressing   Problem: Coping: Goal: Level of anxiety will decrease Outcome: Progressing   Problem: Pain Managment: Goal: General experience of comfort will improve and/or be controlled Outcome: Progressing

## 2023-10-25 NOTE — Assessment & Plan Note (Signed)
-   The patient be admitted to a medical-surgical bed. - Should be kept n.p.o. except for medications. - Will utilize NG tube if needed. - Pain management will be provided. - Will follow two-view abdomen x-ray in AM. - Surgery consult will be obtained. - I notified Dr. Kallie about the patient.

## 2023-10-25 NOTE — Consult Note (Signed)
 [facility] Consult  Reason for Consult: SBO Referring Physician: Dr. Maree   Chief Complaint   Emesis     HPI: [name] is a 83 y.o. female with a SBO. Patient with some degree of dementia who comes in with reported abdominal pain and nausea/vomiting. She was admitted last night with concern for SBO on CT scan. She had prior cholecystectomy.  She has a history of HTN, CVA, a fib. She is not sure when her last BM was but thinks it was while at the hospital. She is not oriented to last name, location, of date.    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   Vision abnormalities    as result of stroke   Xerophthalmia     Past Surgical History:  Procedure Laterality Date   ABDOMINAL AORTOGRAM W/LOWER EXTREMITY Left 12/14/2022   Procedure: ABDOMINAL AORTOGRAM W/LOWER EXTREMITY;  Surgeon: Magda Debby SAILOR, MD;  Location: [facility] INVASIVE CV LAB;  Service: Cardiovascular;  Laterality: Left;   CHOLECYSTECTOMY     leg surgery     TONSILLECTOMY      Family History  Family history unknown: Yes    Social History   Tobacco Use   Smoking status: Former   Smokeless tobacco: Never  Vaping Use   Vaping status: Never Used  Substance Use Topics   Alcohol use: No   Drug use: No    Medications: I have reviewed the patient's current medications. Prior to Admission:  Medications Prior to Admission  Medication Sig Dispense Refill Last Dose/Taking   acetaminophen  (TYLENOL ) 650 MG CR tablet Take 650 mg by mouth every 4 (four) hours as needed for pain. (Patient not taking: Reported on 08/27/2023)      apixaban  (ELIQUIS ) 2.5 MG TABS tablet Take 1 tablet (2.5 mg total) by mouth 2 (two) times daily. 60 tablet 0    aspirin  EC 81 MG  tablet Take 1 tablet (81 mg total) by mouth daily. (Patient not taking: Reported on 08/27/2023) 30 tablet 11    atorvastatin  (LIPITOR) 20 MG tablet Take 20 mg by mouth daily.      calcium  carbonate (TUMS - DOSED IN MG ELEMENTAL CALCIUM ) 500 MG chewable tablet Chew 1 tablet by mouth 2 (two) times daily with a meal.      Clobetasol Propionate 0.05 % shampoo Apply 1 application  topically See admin instructions. On shower day      escitalopram  (LEXAPRO ) 5 MG tablet Take 2.5 mg by mouth at bedtime.      ferrous sulfate  325 (65 FE) MG tablet Take 325 mg by mouth 3 (three) times daily with meals.      furosemide  (LASIX ) 20 MG tablet Take 20 mg by mouth daily.      levothyroxine  (SYNTHROID ) 112 MCG tablet Take 112 mcg by mouth daily before breakfast.      Menthol, Topical Analgesic, (BIOFREEZE) 4 % GEL Apply 1 application topically See admin instructions. Apply to right shoulder twice a day (Patient not taking: Reported on 08/27/2023)      metoprolol  tartrate (LOPRESSOR ) 50 MG tablet Take 1 tablet (50 mg total) by mouth every 8 (eight) hours. 90 tablet 0    mirtazapine  (REMERON ) 15  MG tablet Take 15 mg by mouth at bedtime.      mupirocin  ointment (BACTROBAN ) 2 % Apply 1 Application topically daily. (Patient not taking: Reported on 08/27/2023) 30 g 2    omeprazole (PRILOSEC) 20 MG capsule Take 20 mg by mouth daily.      OXYGEN Inhale 2 L into the lungs 2 (two) times daily as needed (Shortness of breath and when oxygen levels drops below 92%). (Patient not taking: Reported on 08/27/2023)      polyethylene glycol (MIRALAX  / GLYCOLAX ) 17 g packet Take 17 g by mouth daily as needed for mild constipation or moderate constipation. 14 each 0    Probiotic Product (ACIDOPHILUS PROBIOTIC BLEND) CAPS Take 1 capsule by mouth daily.      promethazine (PHENERGAN) 12.5 MG tablet Take 12.5 mg by mouth daily as needed for nausea or vomiting.      Propylene Glycol (SYSTANE BALANCE OP) Place 1 drop into both eyes 2 (two) times daily.       Salicylic Acid 50 % SOLN by Does not apply route.      vitamin B-12 (CYANOCOBALAMIN) 500 MCG tablet Take 0.5 tablets (250 mcg total) by mouth daily. (Patient not taking: Reported on 08/27/2023) 30 tablet 0    Scheduled:  atorvastatin   20 mg Oral Daily   calcium  carbonate  1 tablet Oral BID WC   enoxaparin  (LOVENOX ) injection  40 mg Subcutaneous Q24H   escitalopram   5 mg Oral QHS   ferrous sulfate   325 mg Oral TID WC   levothyroxine   112 mcg Oral QAC breakfast   metoprolol  tartrate  50 mg Oral Q8H   mirtazapine   15 mg Oral QHS   pantoprazole   40 mg Oral Daily   Continuous:  0.9 % NaCl with KCl 20 mEq / L 75 mL/hr at 10/25/23 1545   PRN:acetaminophen  **OR** acetaminophen , magnesium  hydroxide, morphine  injection, ondansetron  **OR** ondansetron  (ZOFRAN ) IV, traZODone   Allergies  Allergen Reactions   Sulfa Antibiotics Other (See Comments)    Unknown     ROS:  A comprehensive review of systems was negative except for: Gastrointestinal: positive for abdominal pain, nausea, and vomiting  Blood pressure (!) 91/54, pulse 83, temperature (!) 97.4 F (36.3 C), temperature source Oral, resp. rate 16, height 5' 7 (1.702 m), weight 58.5 kg, SpO2 92%. Physical Exam Vitals reviewed.  HENT:     Head: Normocephalic.     Mouth/Throat:     Mouth: Mucous membranes are moist.  Eyes:     Extraocular Movements: Extraocular movements intact.  Cardiovascular:     Rate and Rhythm: Normal rate.  Pulmonary:     Effort: Pulmonary effort is normal.  Abdominal:     General: There is no distension.     Palpations: Abdomen is soft.     Tenderness: There is no abdominal tenderness.  Skin:    General: Skin is warm.  Neurological:     Mental Status: She is alert. Mental status is at baseline.  Psychiatric:        Mood and Affect: Mood normal.        Thought Content: Thought content normal.     Results: Results for orders placed or performed during the hospital encounter of 10/24/23 (from the  past 48 hours)  Comprehensive metabolic panel     Status: Abnormal   Collection Time: 10/24/23 11:38 PM  Result Value Ref Range   Sodium 138 135 - 145 mmol/L   Potassium 3.7 3.5 - 5.1 mmol/L   Chloride  100 98 - 111 mmol/L   CO2 25 22 - 32 mmol/L   Glucose, Bld 142 (H) 70 - 99 mg/dL    Comment: Glucose reference range applies only to samples taken after fasting for at least 8 hours.   BUN 22 8 - 23 mg/dL   Creatinine, Ser 9.08 0.44 - 1.00 mg/dL   Calcium  9.1 8.9 - 10.3 mg/dL   Total Protein 7.4 6.5 - 8.1 g/dL   Albumin 3.5 3.5 - 5.0 g/dL   AST 17 15 - 41 U/L   ALT 11 0 - 44 U/L   Alkaline Phosphatase 109 38 - 126 U/L   Total Bilirubin 1.0 0.0 - 1.2 mg/dL   GFR, Estimated >39 >39 mL/min    Comment: (NOTE) Calculated using the CKD-EPI Creatinine Equation (2021)    Anion gap 13 5 - 15    Comment: Performed at [facility], [address] [place], [place] [address]  Lipase, blood     Status: Abnormal   Collection Time: 10/24/23 11:38 PM  Result Value Ref Range   Lipase 55 (H) 11 - 51 U/L    Comment: Performed at [facility], [address], [place], [place] [address]  CBC with Differential     Status: Abnormal   Collection Time: 10/24/23 11:38 PM  Result Value Ref Range   WBC 9.8 4.0 - 10.5 K/uL   RBC 4.46 3.87 - 5.11 MIL/uL   Hemoglobin 11.7 (L) 12.0 - 15.0 g/dL   HCT 62.5 63.9 - 53.9 %   MCV 83.9 80.0 - 100.0 fL   MCH 26.2 26.0 - 34.0 pg   MCHC 31.3 30.0 - 36.0 g/dL   RDW 82.4 (H) 88.4 - 84.4 %   Platelets 279 150 - 400 K/uL   nRBC 0.0 0.0 - 0.2 %   Neutrophils Relative % 78 %   Neutro Abs 7.5 1.7 - 7.7 K/uL   Lymphocytes Relative 14 %   Lymphs Abs 1.4 0.7 - 4.0 K/uL   Monocytes Relative 8 %   Monocytes Absolute 0.8 0.1 - 1.0 K/uL   Eosinophils Relative 0 %   Eosinophils Absolute 0.0 0.0 - 0.5 K/uL   Basophils Relative 0 %   Basophils Absolute 0.0 0.0 - 0.1 K/uL   Immature Granulocytes 0 %   Abs Immature Granulocytes 0.04 0.00 - 0.07 K/uL    Comment: Performed at  [facility], [address], [place], [place] [address]  Lactic acid, plasma     Status: None   Collection Time: 10/24/23 11:38 PM  Result Value Ref Range   Lactic Acid, Venous 1.1 0.5 - 1.9 mmol/L    Comment: Performed at [facility], [address] [place], [place] [address]  Urinalysis, w/ Reflex to Culture (Infection Suspected) -Urine, Unspecified Source     Status: Abnormal   Collection Time: 10/25/23  2:03 AM  Result Value Ref Range   Specimen Source URINE, UNSPE    Color, Urine YELLOW YELLOW   APPearance HAZY (A) CLEAR   Specific Gravity, Urine 1.015 1.005 - 1.030   pH 7.0 5.0 - 8.0   Glucose, UA NEGATIVE NEGATIVE mg/dL   Hgb urine dipstick SMALL (A) NEGATIVE   Bilirubin Urine NEGATIVE NEGATIVE   Ketones, ur NEGATIVE NEGATIVE mg/dL   Protein, ur NEGATIVE NEGATIVE mg/dL   Nitrite NEGATIVE NEGATIVE   Leukocytes,Ua TRACE (A) NEGATIVE   RBC / HPF 6-10 0 - 5 RBC/hpf   WBC, UA 6-10 0 - 5 WBC/hpf    Comment:  Reflex urine culture not performed if WBC <=10, OR if Squamous epithelial cells >5. If Squamous epithelial cells >5 suggest recollection.    Bacteria, UA RARE (A) NONE SEEN   Squamous Epithelial / HPF 0-5 0 - 5 /HPF   Mucus PRESENT     Comment: Performed at [facility], [address] [place], [place] [address]  Basic metabolic panel     Status: Abnormal   Collection Time: 10/25/23  4:24 AM  Result Value Ref Range   Sodium 137 135 - 145 mmol/L   Potassium 3.8 3.5 - 5.1 mmol/L   Chloride 104 98 - 111 mmol/L   CO2 26 22 - 32 mmol/L   Glucose, Bld 116 (H) 70 - 99 mg/dL    Comment: Glucose reference range applies only to samples taken after fasting for at least 8 hours.   BUN 20 8 - 23 mg/dL   Creatinine, Ser 9.21 0.44 - 1.00 mg/dL   Calcium  8.6 (L) 8.9 - 10.3 mg/dL   GFR, Estimated >39 >39 mL/min    Comment: (NOTE) Calculated using the CKD-EPI Creatinine Equation (2021)    Anion gap 7 5 - 15    Comment: Performed at [facility], [address],  [place], [place] [address]  CBC     Status: Abnormal   Collection Time: 10/25/23  4:24 AM  Result Value Ref Range   WBC 8.9 4.0 - 10.5 K/uL   RBC 4.18 3.87 - 5.11 MIL/uL   Hemoglobin 11.0 (L) 12.0 - 15.0 g/dL   HCT 64.4 (L) 63.9 - 53.9 %   MCV 84.9 80.0 - 100.0 fL   MCH 26.3 26.0 - 34.0 pg   MCHC 31.0 30.0 - 36.0 g/dL   RDW 82.3 (H) 88.4 - 84.4 %   Platelets 265 150 - 400 K/uL   nRBC 0.0 0.0 - 0.2 %    Comment: Performed at [facility], [address], [place], [place] [address]  TSH     Status: None   Collection Time: 10/25/23  4:24 AM  Result Value Ref Range   TSH 2.601 0.350 - 4.500 uIU/mL    Comment: Performed by a 3rd Generation assay with a functional sensitivity of <=0.01 uIU/mL. Performed at [facility], [address] [place], [place] [address]   Glucose, capillary     Status: Abnormal   Collection Time: 10/25/23  7:08 AM  Result Value Ref Range   Glucose-Capillary 100 (H) 70 - 99 mg/dL    Comment: Glucose reference range applies only to samples taken after fasting for at least 8 hours.   Personally reviewed- small bowel dilated but no obvious transition to me but possible area on the right anterior region  DG Abd 2 Views Result Date: 10/25/2023 CLINICAL DATA:  83 year old female with abdominal pain and vomiting. Small-bowel obstruction suspected by CT. EXAM: ABDOMEN - 2 VIEW COMPARISON:  CT Abdomen and Pelvis 0111 hours today. FINDINGS: Portable AP semi upright and portable AP supine views at 0531 hours. Calcified aortic atherosclerosis. Cardiomegaly, small pericardial effusion by CT. Stable lung bases. IV contrast in nondilated renal collecting systems, bladder. Unchanged bowel gas pattern from CT scout view this morning. No pneumoperitoneum. Stable cholecystectomy clips. No acute osseous abnormality identified. IMPRESSION: 1. Unchanged bowel gas pattern from CT this morning. No pneumoperitoneum. 2. Cardiomegaly, small pericardial effusion by CT. 3. Aortic Atherosclerosis  (ICD10-I70.0). Electronically Signed   By: VEAR Hurst M.D.   On: 10/25/2023 08:22   CT ABDOMEN PELVIS W CONTRAST Result Date:  10/25/2023 CLINICAL DATA:  Abdominal pain and emesis. EXAM: CT ABDOMEN AND PELVIS WITH CONTRAST TECHNIQUE: Multidetector CT imaging of the abdomen and pelvis was performed using the standard protocol following bolus administration of intravenous contrast. RADIATION DOSE REDUCTION: This exam was performed according to the departmental dose-optimization program which includes automated exposure control, adjustment of the mA and/or kV according to patient size and/or use of iterative reconstruction technique. CONTRAST:  OMNIPAQUE  IOHEXOL  300 MG/ML  SOLN COMPARISON:  CT pelvis 08/21/2021 FINDINGS: Lower chest: Marked cardiomegaly. Moderate partially visualized pericardial effusion. Hepatobiliary: Cholecystectomy.  No acute abnormality. Pancreas: Unremarkable. Spleen: Unremarkable. Adrenals/Urinary Tract: Normal adrenal glands. Cortical renal scarring in the left kidney. Punctate nonobstructing bilateral nephrolithiasis versus vascular calcifications. Unremarkable bladder. Stomach/Bowel: Multiple loops of dilated small bowel with suggestion of transition point in low anterior abdomen. Thickening of the wall of the small bowel in the pelvis compatible with enteritis. Normal caliber colon. Colonic diverticulosis without diverticulitis. Normal appendix. Vascular/Lymphatic: Advanced aortic atherosclerotic calcification. No lymphadenopathy. Reproductive: Hysterectomy.  No adnexal mass. Other: No free intraperitoneal fluid or air. Musculoskeletal: No acute fracture. IMPRESSION: 1. Small-bowel obstruction with possible transition point in the low anterior abdomen. 2. Thickening of the wall of the small bowel in the pelvis compatible with nonspecific enteritis. 3. Marked cardiomegaly with moderate partially visualized pericardial effusion. 4. Aortic Atherosclerosis (ICD10-I70.0). Electronically  Signed   By: Norman Gatlin M.D.   On: 10/25/2023 01:46     Assessment & Plan:  [name] is a 83 y.o. female with SBO with reported some flatus and Bm since being in the hospital. No NG in place at this time.  -NPO can have ice and sips of water  -Will wait and see how she does before giving more diet   All questions were answered to the satisfaction of the patient. Updated team.     Manuelita JAYSON Pander 10/25/2023, 5:23 PM

## 2023-10-25 NOTE — Progress Notes (Signed)
   10/25/23 1003  TOC Brief Assessment  Insurance and Status Reviewed  Patient has primary care physician Yes ([facility])  Home environment has been reviewed From home  Prior level of function: Independent  Prior/Current Home Services No current home services  Social Drivers of Health Review SDOH reviewed no interventions necessary  Readmission risk has been reviewed Yes (Yellow)  Transition of care needs no transition of care needs at this time      Transition of Care Department [facility]) has reviewed patient and no TOC needs have been identified at this time. We will continue to monitor patient advancement through interdisciplinary progression rounds. If new patient transition needs arise, please place a TOC consult.

## 2023-10-25 NOTE — Assessment & Plan Note (Signed)
-   We will hold off Eliquis  for potential need for surgical intervention though I doubt it. - Will hold off aspirin  as well. - The patient will be hydrated with IV normal saline

## 2023-10-25 NOTE — Assessment & Plan Note (Signed)
 -  We will continue statin therapy.

## 2023-10-25 NOTE — Assessment & Plan Note (Addendum)
-   Will continue Synthroid  and check TSH level.

## 2023-10-25 NOTE — Assessment & Plan Note (Signed)
-   We will continue antidepressant therapy.

## 2023-10-25 NOTE — Progress Notes (Signed)
 PROGRESS NOTE    [name]  FMW:[id number] DOB: 1940/10/01 DOA: 10/24/2023 PCP: Barbaraann Harvey, FNP   Brief Narrative:    [name] is a 83 y.o. female with medical history significant for GERD, atrial fibrillation, hypertension, dyslipidemia, hypothyroidism and CVA, who presented to the emergency room with acute onset of recurrent vomiting for multiple times throughout the day.  Patient was admitted for SBO and is currently awaiting further general surgery evaluation.  Assessment & Plan:   Principal Problem:   SBO (small bowel obstruction) (HCC) Active Problems:   Atrial fibrillation with controlled ventricular rate (HCC)   Essential hypertension   Hypothyroidism   Dyslipidemia   Depression  Assessment and Plan:   SBO (small bowel obstruction) (HCC) - The patient be admitted to a medical-surgical bed. - Should be kept n.p.o. except for medications. - Pain management will be provided. - Follow-up abdominal x-ray continues to show signs of SBO - Appreciate general surgery evaluation with plans to monitor for bowel movement prior to diet advancement   Atrial fibrillation with controlled ventricular rate (HCC) - We will hold off Eliquis  for potential need for surgical intervention though I doubt it. - Will hold off aspirin  as well. - The patient will be hydrated with IV normal saline  Cognitive impairment/dementia -Appears pleasantly confused -Noted to have signs of significant cerebral atrophy as well as encephalomalacia on 07/2021 on head CT   Essential hypertension - We will resume antihypertensive therapy. -Hold Lasix    Dyslipidemia - We will continue statin therapy.   Hypothyroidism - Will continue Synthroid   - TSH 2.6   Depression - We will continue antidepressant therapy.    DVT prophylaxis: Lovenox  Code Status: Full Family Communication: Tried calling son with no response, none at bedside Disposition Plan:  Status is: Inpatient Remains  inpatient appropriate because: Need for IV fluids and medications.   Consultants:  General Surgery  Procedures:  None  Antimicrobials:  None   Subjective: Patient seen and evaluated today with no complaints of abdominal pain, nausea or vomiting.  She appears confused, but is stating that she had bowel movement yesterday.  Objective: Vitals:   10/25/23 0314 10/25/23 0356 10/25/23 0740 10/25/23 1145  BP: (!) 149/94  99/63 (!) 91/54  Pulse: 92  84 83  Resp: 14  17 16   Temp: 97.7 F (36.5 C)  (!) 97.5 F (36.4 C) (!) 97.4 F (36.3 C)  TempSrc: Oral  Oral Oral  SpO2: 96%  92% 92%  Weight: 61.7 kg 58.5 kg    Height:  5' 7 (1.702 m)      Intake/Output Summary (Last 24 hours) at 10/25/2023 1246 Last data filed at 10/25/2023 0608 Gross per 24 hour  Intake 1000 ml  Output --  Net 1000 ml   Filed Weights   10/24/23 2314 10/25/23 0314 10/25/23 0356  Weight: 63.4 kg 61.7 kg 58.5 kg    Examination:  General exam: Appears calm and comfortable  Respiratory system: Clear to auscultation. Respiratory effort normal. Cardiovascular system: S1 & S2 heard, RRR.  Gastrointestinal system: Abdomen is soft Central nervous system: Alert and awake Extremities: No edema Skin: No significant lesions noted Psychiatry: Flat affect.    Data Reviewed: I have personally reviewed following labs and imaging studies  CBC: Recent Labs  Lab 10/24/23 2338 10/25/23 0424  WBC 9.8 8.9  NEUTROABS 7.5  --   HGB 11.7* 11.0*  HCT 37.4 35.5*  MCV 83.9 84.9  PLT 279 265   Basic Metabolic Panel: Recent Labs  Lab 10/24/23 2338 10/25/23 0424  NA 138 137  K 3.7 3.8  CL 100 104  CO2 25 26  GLUCOSE 142* 116*  BUN 22 20  CREATININE 0.91 0.78  CALCIUM  9.1 8.6*   GFR: Estimated Creatinine Clearance: 50.1 mL/min (by C-G formula based on SCr of 0.78 mg/dL). Liver Function Tests: Recent Labs  Lab 10/24/23 2338  AST 17  ALT 11  ALKPHOS 109  BILITOT 1.0  PROT 7.4  ALBUMIN 3.5   Recent  Labs  Lab 10/24/23 2338  LIPASE 55*   No results for input(s): AMMONIA in the last 168 hours. Coagulation Profile: No results for input(s): INR, PROTIME in the last 168 hours. Cardiac Enzymes: No results for input(s): CKTOTAL, CKMB, CKMBINDEX, TROPONINI in the last 168 hours. BNP (last 3 results) No results for input(s): PROBNP in the last 8760 hours. HbA1C: No results for input(s): HGBA1C in the last 72 hours. CBG: Recent Labs  Lab 10/25/23 0708  GLUCAP 100*   Lipid Profile: No results for input(s): CHOL, HDL, LDLCALC, TRIG, CHOLHDL, LDLDIRECT in the last 72 hours. Thyroid Function Tests: Recent Labs    10/25/23 0424  TSH 2.601   Anemia Panel: No results for input(s): VITAMINB12, FOLATE, FERRITIN, TIBC, IRON, RETICCTPCT in the last 72 hours. Sepsis Labs: Recent Labs  Lab 10/24/23 2338  LATICACIDVEN 1.1    No results found for this or any previous visit (from the past 240 hours).       Radiology Studies: DG Abd 2 Views Result Date: 10/25/2023 CLINICAL DATA:  83 year old female with abdominal pain and vomiting. Small-bowel obstruction suspected by CT. EXAM: ABDOMEN - 2 VIEW COMPARISON:  CT Abdomen and Pelvis 0111 hours today. FINDINGS: Portable AP semi upright and portable AP supine views at 0531 hours. Calcified aortic atherosclerosis. Cardiomegaly, small pericardial effusion by CT. Stable lung bases. IV contrast in nondilated renal collecting systems, bladder. Unchanged bowel gas pattern from CT scout view this morning. No pneumoperitoneum. Stable cholecystectomy clips. No acute osseous abnormality identified. IMPRESSION: 1. Unchanged bowel gas pattern from CT this morning. No pneumoperitoneum. 2. Cardiomegaly, small pericardial effusion by CT. 3. Aortic Atherosclerosis (ICD10-I70.0). Electronically Signed   By: VEAR Hurst M.D.   On: 10/25/2023 08:22   CT ABDOMEN PELVIS W CONTRAST Result Date: 10/25/2023 CLINICAL DATA:  Abdominal  pain and emesis. EXAM: CT ABDOMEN AND PELVIS WITH CONTRAST TECHNIQUE: Multidetector CT imaging of the abdomen and pelvis was performed using the standard protocol following bolus administration of intravenous contrast. RADIATION DOSE REDUCTION: This exam was performed according to the departmental dose-optimization program which includes automated exposure control, adjustment of the mA and/or kV according to patient size and/or use of iterative reconstruction technique. CONTRAST:  OMNIPAQUE  IOHEXOL  300 MG/ML  SOLN COMPARISON:  CT pelvis 08/21/2021 FINDINGS: Lower chest: Marked cardiomegaly. Moderate partially visualized pericardial effusion. Hepatobiliary: Cholecystectomy.  No acute abnormality. Pancreas: Unremarkable. Spleen: Unremarkable. Adrenals/Urinary Tract: Normal adrenal glands. Cortical renal scarring in the left kidney. Punctate nonobstructing bilateral nephrolithiasis versus vascular calcifications. Unremarkable bladder. Stomach/Bowel: Multiple loops of dilated small bowel with suggestion of transition point in low anterior abdomen. Thickening of the wall of the small bowel in the pelvis compatible with enteritis. Normal caliber colon. Colonic diverticulosis without diverticulitis. Normal appendix. Vascular/Lymphatic: Advanced aortic atherosclerotic calcification. No lymphadenopathy. Reproductive: Hysterectomy.  No adnexal mass. Other: No free intraperitoneal fluid or air. Musculoskeletal: No acute fracture. IMPRESSION: 1. Small-bowel obstruction with possible transition point in the low anterior abdomen. 2. Thickening of the wall of the small  bowel in the pelvis compatible with nonspecific enteritis. 3. Marked cardiomegaly with moderate partially visualized pericardial effusion. 4. Aortic Atherosclerosis (ICD10-I70.0). Electronically Signed   By: Norman Gatlin M.D.   On: 10/25/2023 01:46        Scheduled Meds:  atorvastatin   20 mg Oral Daily   calcium  carbonate  1 tablet Oral BID WC    enoxaparin  (LOVENOX ) injection  40 mg Subcutaneous Q24H   escitalopram   5 mg Oral QHS   ferrous sulfate   325 mg Oral TID WC   levothyroxine   112 mcg Oral QAC breakfast   metoprolol  tartrate  50 mg Oral Q8H   mirtazapine   15 mg Oral QHS   pantoprazole   40 mg Oral Daily   Continuous Infusions:  0.9 % NaCl with KCl 20 mEq / L 100 mL/hr at 10/25/23 0608     LOS: 0 days    Time spent: 55 minutes    Adalbert Alberto JONETTA Fairly, DO [facility]  If 7PM-7AM, please contact night-coverage [email or web address] 10/25/2023, 12:46 PM

## 2023-10-25 NOTE — H&P (Signed)
 [place]   PATIENT NAME: [name]    MR#:  [id number]  DATE OF BIRTH:  1940/08/04  DATE OF ADMISSION:  10/24/2023  PRIMARY CARE PHYSICIAN: Barbaraann Harvey, FNP   Patient is coming from: Home  REQUESTING/REFERRING PHYSICIAN:  Roselyn Dunnings, MD  CHIEF COMPLAINT:   Chief Complaint  Patient presents with   Emesis    HISTORY OF PRESENT ILLNESS:  [name] is a 83 y.o. female with medical history significant for GERD, atrial fibrillation, hypertension, dyslipidemia, hypothyroidism and CVA, who presented to the emergency room with acute onset of recurrent vomiting for multiple times throughout the day.  She was not vomiting in the ER.  She admitted to associated abdominal pain.  No bilious vomitus or hematemesis.  No fever or chills.  No diarrhea or melena or bright red bleeding per rectum.  No chest pain or palpitations.  No cough or wheezing or dyspnea.  No dysuria, oliguria or hematuria or flank pain.  No bleeding diathesis.  ED Course: When the patient came to the ER,  BP was 152/86 with otherwise normal vital signs.  Labs revealed unremarkable CMP.  Serum lipase was 55.  Lactic acid was 1.1.  CBC showed hemoglobin 11.7 hematocrit 37.4.  Urinalysis currently pending. EKG as reviewed by me : EKG showed atrial fibrillation with controlled ventricular sponsor of 83 with right axis deviation and poor R wave progression. Imaging: Abdominal and pelvic CT scan with contrast revealed the following: 1. Small-bowel obstruction with possible transition point in the low anterior abdomen. 2. Thickening of the wall of the small bowel in the pelvis compatible with nonspecific enteritis. 3. Marked cardiomegaly with moderate partially visualized pericardial effusion. 4. Aortic Atherosclerosis.  The patient was given 4 mg of IV Zofran  and 1 L bolus of IV lactated ringer .  She will be admitted to a medical bed for further evaluation and management. 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   Vision abnormalities    as result of stroke   Xerophthalmia     PAST SURGICAL HISTORY:   Past Surgical History:  Procedure Laterality Date   ABDOMINAL AORTOGRAM W/LOWER EXTREMITY Left 12/14/2022   Procedure: ABDOMINAL AORTOGRAM W/LOWER EXTREMITY;  Surgeon: Magda Debby SAILOR, MD;  Location: [facility]AS[facility];  Service: Cardiovascular;  Laterality: Left;   CHOLECYSTECTOMY     leg surgery     TONSILLECTOMY      SOCIAL HISTORY:   Social History   Tobacco Use   Smoking status: Former   Smokeless tobacco: Never  Substance Use Topics   Alcohol use: No    FAMILY HISTORY:   Family History  Family history unknown: Yes    DRUG ALLERGIES:   Allergies  Allergen Reactions   Sulfa Antibiotics Other (See Comments)    Reaction     REVIEW OF SYSTEMS:   ROS As per history of present illness. All pertinent systems were reviewed above. Constitutional, HEENT, cardiovascular, respiratory, GI, GU, musculoskeletal, neuro, psychiatric, endocrine, integumentary and hematologic systems were reviewed and are otherwise negative/unremarkable except for positive findings mentioned above in the HPI.   MEDICATIONS AT HOME:   Prior to Admission  medications   Medication Sig Start Date End Date Taking? Authorizing Provider  acetaminophen  (TYLENOL ) 650 MG CR tablet Take 650 mg by mouth every 4 (four) hours as needed for pain. Patient not taking: Reported on 08/27/2023    [provider]  apixaban  (ELIQUIS ) 2.5 MG TABS tablet Take 1 tablet (2.5 mg total) by mouth 2 (two) times daily. 07/03/22   Regalado, Belkys A, MD  aspirin  EC 81 MG tablet Take 1 tablet (81 mg total) by mouth daily. Patient not taking:  Reported on 08/27/2023 07/03/22   Regalado, Owen A, MD  atorvastatin  (LIPITOR) 20 MG tablet Take 20 mg by mouth daily.    [provider]  calcium  carbonate (TUMS - DOSED IN MG ELEMENTAL CALCIUM ) 500 MG chewable tablet Chew 1 tablet by mouth 2 (two) times daily with a meal.    [provider]  Clobetasol Propionate 0.05 % shampoo Apply 1 application  topically See admin instructions. On shower day 11/06/22   [provider]  escitalopram  (LEXAPRO ) 5 MG tablet Take 2.5 mg by mouth at bedtime.    [provider]  ferrous sulfate  325 (65 FE) MG tablet Take 325 mg by mouth 3 (three) times daily with meals.    [provider]  furosemide  (LASIX ) 20 MG tablet Take 20 mg by mouth daily. 07/25/22   [provider]  levothyroxine  (SYNTHROID ) 112 MCG tablet Take 112 mcg by mouth daily before breakfast.    [provider]  Menthol, Topical Analgesic, (BIOFREEZE) 4 % GEL Apply 1 application topically See admin instructions. Apply to right shoulder twice a day Patient not taking: Reported on 08/27/2023    [provider]  metoprolol  tartrate (LOPRESSOR ) 50 MG tablet Take 1 tablet (50 mg total) by mouth every 8 (eight) hours. 07/03/22   Regalado, Belkys A, MD  mirtazapine  (REMERON ) 15 MG tablet Take 15 mg by mouth at bedtime.    [provider]  mupirocin  ointment (BACTROBAN ) 2 % Apply 1 Application topically daily. Patient not taking: Reported on 08/27/2023 05/02/22   Silva Juliene SAUNDERS, DPM  omeprazole (PRILOSEC) 20 MG capsule Take 20 mg by mouth daily.    [provider]  OXYGEN Inhale 2 L into the lungs 2 (two) times daily as needed (Shortness of breath and when oxygen levels drops below 92%). Patient not taking: Reported on 08/27/2023    [provider]  polyethylene glycol (MIRALAX  / GLYCOLAX ) 17 g packet Take 17 g by mouth daily as needed for mild constipation or moderate constipation. 11/16/21   Pappayliou, Dorothyann A, DO   Probiotic Product (ACIDOPHILUS PROBIOTIC BLEND) CAPS Take 1 capsule by mouth daily. 09/17/22   [provider]  promethazine (PHENERGAN) 12.5 MG tablet Take 12.5 mg by mouth daily as needed for nausea or vomiting.    [provider]  Propylene Glycol (SYSTANE BALANCE OP) Place 1 drop into both eyes 2 (two) times daily.    [provider]  Salicylic Acid 50 % SOLN by Does not apply route.    [provider]  vitamin B-12 (CYANOCOBALAMIN) 500 MCG tablet Take 0.5 tablets (250 mcg total) by mouth daily. Patient not taking: Reported on 08/27/2023 07/03/22   Regalado, Owen A, MD      VITAL SIGNS:  Blood pressure (!) 165/89, pulse 81, temperature 97.8 F (36.6 C), temperature source Oral, resp. rate 16, weight 63.4 kg, SpO2 96%.  PHYSICAL EXAMINATION:  Physical Exam  GENERAL:  83 y.o.-year-old patient lying in the bed with  no acute distress.  EYES: Pupils equal, round, reactive to light and accommodation. No scleral icterus. Extraocular muscles intact.  HEENT: Head atraumatic, normocephalic. Oropharynx and nasopharynx clear.  NECK:  Supple, no jugular venous distention. No thyroid enlargement, no tenderness.  LUNGS: Normal breath sounds bilaterally, no wheezing, rales,rhonchi or crepitation. No use of accessory muscles of respiration.  CARDIOVASCULAR: Regular rate and rhythm, S1, S2 normal. No murmurs, rubs, or gallops.  ABDOMEN: Soft, nondistended, nontender. Bowel sounds present. No organomegaly or mass.  EXTREMITIES: No pedal edema, cyanosis, or clubbing.  NEUROLOGIC: Cranial nerves II through XII are intact. Muscle strength 5/5 in all extremities. Sensation intact. Gait not checked.  PSYCHIATRIC: The patient is alert and oriented x 3.  Normal affect and good eye contact. SKIN: No obvious rash, lesion, or ulcer.   LABORATORY PANEL:   CBC Recent Labs  Lab 10/24/23 2338  WBC 9.8  HGB 11.7*  HCT 37.4  PLT 279    ------------------------------------------------------------------------------------------------------------------  Chemistries  Recent Labs  Lab 10/24/23 2338  NA 138  K 3.7  CL 100  CO2 25  GLUCOSE 142*  BUN 22  CREATININE 0.91  CALCIUM  9.1  AST 17  ALT 11  ALKPHOS 109  BILITOT 1.0   ------------------------------------------------------------------------------------------------------------------  Cardiac Enzymes No results for input(s): TROPONINI in the last 168 hours. ------------------------------------------------------------------------------------------------------------------  RADIOLOGY:  CT ABDOMEN PELVIS W CONTRAST Result Date: 10/25/2023 CLINICAL DATA:  Abdominal pain and emesis. EXAM: CT ABDOMEN AND PELVIS WITH CONTRAST TECHNIQUE: Multidetector CT imaging of the abdomen and pelvis was performed using the standard protocol following bolus administration of intravenous contrast. RADIATION DOSE REDUCTION: This exam was performed according to the departmental dose-optimization program which includes automated exposure control, adjustment of the mA and/or kV according to patient size and/or use of iterative reconstruction technique. CONTRAST:  OMNIPAQUE  IOHEXOL  300 MG/ML  SOLN COMPARISON:  CT pelvis 08/21/2021 FINDINGS: Lower chest: Marked cardiomegaly. Moderate partially visualized pericardial effusion. Hepatobiliary: Cholecystectomy.  No acute abnormality. Pancreas: Unremarkable. Spleen: Unremarkable. Adrenals/Urinary Tract: Normal adrenal glands. Cortical renal scarring in the left kidney. Punctate nonobstructing bilateral nephrolithiasis versus vascular calcifications. Unremarkable bladder. Stomach/Bowel: Multiple loops of dilated small bowel with suggestion of transition point in low anterior abdomen. Thickening of the wall of the small bowel in the pelvis compatible with enteritis. Normal caliber colon. Colonic diverticulosis without diverticulitis. Normal  appendix. Vascular/Lymphatic: Advanced aortic atherosclerotic calcification. No lymphadenopathy. Reproductive: Hysterectomy.  No adnexal mass. Other: No free intraperitoneal fluid or air. Musculoskeletal: No acute fracture. IMPRESSION: 1. Small-bowel obstruction with possible transition point in the low anterior abdomen. 2. Thickening of the wall of the small bowel in the pelvis compatible with nonspecific enteritis. 3. Marked cardiomegaly with moderate partially visualized pericardial effusion. 4. Aortic Atherosclerosis (ICD10-I70.0). Electronically Signed   By: Norman Gatlin M.D.   On: 10/25/2023 01:46      IMPRESSION AND PLAN:  Assessment and Plan: * SBO (small bowel obstruction) (HCC) - The patient be admitted to a medical-surgical bed. - Should be kept n.p.o. except for medications. - Will utilize NG tube if needed. - Pain management will be provided. - Will follow two-view abdomen x-ray in AM. - Surgery consult will be obtained. - I notified Dr. Kallie about the patient.  Atrial fibrillation with controlled ventricular rate (HCC) - We will hold off Eliquis  for potential need for surgical intervention though I doubt it. - Will hold off aspirin  as well. - The patient will be hydrated with IV normal saline  Essential hypertension - We will  resume antihypertensive therapy.  Dyslipidemia - We will continue statin therapy.  Hypothyroidism - Will continue Synthroid  and check TSH level.  Depression - We will continue antidepressant therapy.   DVT prophylaxis: Lovenox .  Advanced Care Planning:  Code Status: full code.  Family Communication:  The plan of care was discussed in details with the patient (and family). I answered all questions. The patient agreed to proceed with the above mentioned plan. Further management will depend upon hospital course. Disposition Plan: Back to previous home environment Consults called: General Surgery. All the records are reviewed and case  discussed with ED provider.  Status is: Inpatient  At the time of the admission, it appears that the appropriate admission status for this patient is inpatient.  This is judged to be reasonable and necessary in order to provide the required intensity of service to ensure the patient's safety given the presenting symptoms, physical exam findings and initial radiographic and laboratory data in the context of comorbid conditions.  The patient requires inpatient status due to high intensity of service, high risk of further deterioration and high frequency of surveillance required.  I certify that at the time of admission, it is my clinical judgment that the patient will require inpatient hospital care extending more than 2 midnights.                            Dispo: The patient is from: Home              Anticipated d/c is to: Home              Patient currently is not medically stable to d/c.              Difficult to place patient: No  Madison DELENA Peaches M.D on 10/25/2023 at 2:59 AM  [facility]   From 7 PM-7 AM, contact night-coverage [email or web address]  CC: Primary care physician; Barbaraann Harvey, FNP

## 2023-10-25 NOTE — ED Notes (Signed)
 Attempted X2 to in/out cath pt for UA sample Unable to advance into urethra

## 2023-10-25 NOTE — Assessment & Plan Note (Signed)
-   We will resume antihypertensive therapy.

## 2023-10-25 NOTE — ED Notes (Signed)
 Upon this RN's shift assessment, patient is pleasantly confused. A & O x self. She denies pain and appears comfortable. She was reminded that she is being admitted. VSS. Kell[place] RN

## 2023-10-25 NOTE — ED Notes (Signed)
 Patient transported to CT

## 2023-10-26 DIAGNOSIS — K56609 Unspecified intestinal obstruction, unspecified as to partial versus complete obstruction: Secondary | ICD-10-CM | POA: Diagnosis not present

## 2023-10-26 LAB — CBC
HCT: 33.2 % — ABNORMAL LOW (ref 36.0–46.0)
Hemoglobin: 9.6 g/dL — ABNORMAL LOW (ref 12.0–15.0)
MCH: 25.4 pg — ABNORMAL LOW (ref 26.0–34.0)
MCHC: 28.9 g/dL — ABNORMAL LOW (ref 30.0–36.0)
MCV: 87.8 fL (ref 80.0–100.0)
Platelets: 218 K/uL (ref 150–400)
RBC: 3.78 MIL/uL — ABNORMAL LOW (ref 3.87–5.11)
RDW: 17.6 % — ABNORMAL HIGH (ref 11.5–15.5)
WBC: 5.3 K/uL (ref 4.0–10.5)
nRBC: 0 % (ref 0.0–0.2)

## 2023-10-26 LAB — BASIC METABOLIC PANEL WITH GFR
Anion gap: 5 (ref 5–15)
BUN: 18 mg/dL (ref 8–23)
CO2: 26 mmol/L (ref 22–32)
Calcium: 8.1 mg/dL — ABNORMAL LOW (ref 8.9–10.3)
Chloride: 109 mmol/L (ref 98–111)
Creatinine, Ser: 0.9 mg/dL (ref 0.44–1.00)
GFR, Estimated: >60 mL/min (ref >60)
Glucose, Bld: 72 mg/dL (ref 70–99)
Potassium: 4.1 mmol/L (ref 3.5–5.1)
Sodium: 140 mmol/L (ref 135–145)

## 2023-10-26 LAB — MAGNESIUM: Magnesium: 1.4 mg/dL — ABNORMAL LOW (ref 1.7–2.4)

## 2023-10-26 MED ORDER — DEXTROSE IN LACTATED RINGERS 5 % IV SOLN: INTRAVENOUS | Status: DC

## 2023-10-26 MED ORDER — MAGNESIUM SULFATE 2 GM/50ML IV SOLN
2.0000 g | Freq: Once | INTRAVENOUS | Status: AC
  Administered 2023-10-26: 2 g via INTRAVENOUS
  Filled 2023-10-26: qty 50

## 2023-10-26 NOTE — Progress Notes (Signed)
 [facility]  BM reported by night team. No complaints per the patient.  BP 131/74   Pulse 80   Temp 98.1 F (36.7 C) (Oral)   Resp 17   Ht 5' 7 (1.702 m)   Wt 58.5 kg   SpO2 95%   BMI 20.20 kg/m  Soft, nondistended, nontender  Patient with resolving SBO in the setting of some baseline confusion. Doing better today.  Clear diet Monitor for Bms and record KUB in the AM   Manuelita Pander, MD [facility] [address] [place], [place] [address] [phone] (office)

## 2023-10-26 NOTE — Progress Notes (Signed)
 Patient reported episode of small amount of emesis confirmed by [place], [place]. Patient reports not eating anything prior to n/v. Dr. Maree made aware, no new orders at this time.

## 2023-10-26 NOTE — Progress Notes (Addendum)
 PROGRESS NOTE    [name]  FMW:[id number] DOB: 1940-10-06 DOA: 10/24/2023 PCP: Barbaraann Harvey, FNP   Brief Narrative:    [name] is a 83 y.o. female with medical history significant for GERD, atrial fibrillation, hypertension, dyslipidemia, hypothyroidism and CVA, who presented to the emergency room with acute onset of recurrent vomiting for multiple times throughout the day.  Patient was admitted for SBO and appears to be improving clinically.  Dietary advancement has been ordered and repeat imaging pending in AM.  Assessment & Plan:   Principal Problem:   SBO (small bowel obstruction) (HCC) Active Problems:   Atrial fibrillation with controlled ventricular rate (HCC)   Essential hypertension   Hypothyroidism   Dyslipidemia   Depression  Assessment and Plan:   SBO (small bowel obstruction) (HCC)-improving - Advance to soft diet today - Pain management will be provided. - Follow-up abdominal x-ray in a.m. per general surgery - Anticipate discharge in a.m. if tolerating diet   Atrial fibrillation with controlled ventricular rate (HCC) - Hold off on Eliquis  until discharge - Will hold off aspirin  as well.  Cognitive impairment/dementia -Appears pleasantly confused -Noted to have signs of significant cerebral atrophy as well as encephalomalacia on 07/2021 on head CT   Essential hypertension - We will resume antihypertensive therapy. -Hold Lasix  until diet advanced   Dyslipidemia - We will continue statin therapy.   Hypothyroidism - Will continue Synthroid   - TSH 2.6   Depression - We will continue antidepressant therapy.  Hypomagnesemia -Replete and reevaluate in a.m.    DVT prophylaxis: Lovenox  Code Status: Full Family Communication: Tried calling son with no response, none at bedside Disposition Plan:  Status is: Inpatient Remains inpatient appropriate because: Need for IV fluids and medications.   Consultants:  General  Surgery  Procedures:  None  Antimicrobials:  None   Subjective: Patient seen and evaluated today with no complaints of abdominal pain, nausea or vomiting.  She is eager to start diet and has had a small bowel movement overnight.  Objective: Vitals:   10/25/23 1145 10/25/23 1751 10/25/23 2047 10/26/23 0505  BP: (!) 91/54 (!) 114/51 114/66 131/74  Pulse: 83 94 85 80  Resp: 16 17 16 17   Temp: (!) 97.4 F (36.3 C)  97.6 F (36.4 C) 98.1 F (36.7 C)  TempSrc: Oral  Oral Oral  SpO2: 92% 94% 96% 95%  Weight:      Height:        Intake/Output Summary (Last 24 hours) at 10/26/2023 1313 Last data filed at 10/26/2023 0114 Gross per 24 hour  Intake 1074.68 ml  Output --  Net 1074.68 ml   Filed Weights   10/24/23 2314 10/25/23 0314 10/25/23 0356  Weight: 63.4 kg 61.7 kg 58.5 kg    Examination:  General exam: Appears calm and comfortable  Respiratory system: Clear to auscultation. Respiratory effort normal. Cardiovascular system: S1 & S2 heard, RRR.  Gastrointestinal system: Abdomen is soft Central nervous system: Alert and awake Extremities: No edema Skin: No significant lesions noted Psychiatry: Flat affect.    Data Reviewed: I have personally reviewed following labs and imaging studies  CBC: Recent Labs  Lab 10/24/23 2338 10/25/23 0424 10/26/23 0309  WBC 9.8 8.9 5.3  NEUTROABS 7.5  --   --   HGB 11.7* 11.0* 9.6*  HCT 37.4 35.5* 33.2*  MCV 83.9 84.9 87.8  PLT 279 265 218   Basic Metabolic Panel: Recent Labs  Lab 10/24/23 2338 10/25/23 0424 10/26/23 0309  NA 138 137 140  K 3.7 3.8 4.1  CL 100 104 109  CO2 25 26 26   GLUCOSE 142* 116* 72  BUN 22 20 18   CREATININE 0.91 0.78 0.90  CALCIUM  9.1 8.6* 8.1*  MG  --   --  1.4*   GFR: Estimated Creatinine Clearance: 44.5 mL/min (by C-G formula based on SCr of 0.9 mg/dL). Liver Function Tests: Recent Labs  Lab 10/24/23 2338  AST 17  ALT 11  ALKPHOS 109  BILITOT 1.0  PROT 7.4  ALBUMIN 3.5   Recent  Labs  Lab 10/24/23 2338  LIPASE 55*   No results for input(s): AMMONIA in the last 168 hours. Coagulation Profile: No results for input(s): INR, PROTIME in the last 168 hours. Cardiac Enzymes: No results for input(s): CKTOTAL, CKMB, CKMBINDEX, TROPONINI in the last 168 hours. BNP (last 3 results) No results for input(s): PROBNP in the last 8760 hours. HbA1C: No results for input(s): HGBA1C in the last 72 hours. CBG: Recent Labs  Lab 10/25/23 0708  GLUCAP 100*   Lipid Profile: No results for input(s): CHOL, HDL, LDLCALC, TRIG, CHOLHDL, LDLDIRECT in the last 72 hours. Thyroid Function Tests: Recent Labs    10/25/23 0424  TSH 2.601   Anemia Panel: No results for input(s): VITAMINB12, FOLATE, FERRITIN, TIBC, IRON, RETICCTPCT in the last 72 hours. Sepsis Labs: Recent Labs  Lab 10/24/23 2338  LATICACIDVEN 1.1    No results found for this or any previous visit (from the past 240 hours).       Radiology Studies: DG Abd 2 Views Result Date: 10/25/2023 CLINICAL DATA:  83 year old female with abdominal pain and vomiting. Small-bowel obstruction suspected by CT. EXAM: ABDOMEN - 2 VIEW COMPARISON:  CT Abdomen and Pelvis 0111 hours today. FINDINGS: Portable AP semi upright and portable AP supine views at 0531 hours. Calcified aortic atherosclerosis. Cardiomegaly, small pericardial effusion by CT. Stable lung bases. IV contrast in nondilated renal collecting systems, bladder. Unchanged bowel gas pattern from CT scout view this morning. No pneumoperitoneum. Stable cholecystectomy clips. No acute osseous abnormality identified. IMPRESSION: 1. Unchanged bowel gas pattern from CT this morning. No pneumoperitoneum. 2. Cardiomegaly, small pericardial effusion by CT. 3. Aortic Atherosclerosis (ICD10-I70.0). Electronically Signed   By: VEAR Hurst M.D.   On: 10/25/2023 08:22   CT ABDOMEN PELVIS W CONTRAST Result Date: 10/25/2023 CLINICAL DATA:  Abdominal  pain and emesis. EXAM: CT ABDOMEN AND PELVIS WITH CONTRAST TECHNIQUE: Multidetector CT imaging of the abdomen and pelvis was performed using the standard protocol following bolus administration of intravenous contrast. RADIATION DOSE REDUCTION: This exam was performed according to the departmental dose-optimization program which includes automated exposure control, adjustment of the mA and/or kV according to patient size and/or use of iterative reconstruction technique. CONTRAST:  OMNIPAQUE  IOHEXOL  300 MG/ML  SOLN COMPARISON:  CT pelvis 08/21/2021 FINDINGS: Lower chest: Marked cardiomegaly. Moderate partially visualized pericardial effusion. Hepatobiliary: Cholecystectomy.  No acute abnormality. Pancreas: Unremarkable. Spleen: Unremarkable. Adrenals/Urinary Tract: Normal adrenal glands. Cortical renal scarring in the left kidney. Punctate nonobstructing bilateral nephrolithiasis versus vascular calcifications. Unremarkable bladder. Stomach/Bowel: Multiple loops of dilated small bowel with suggestion of transition point in low anterior abdomen. Thickening of the wall of the small bowel in the pelvis compatible with enteritis. Normal caliber colon. Colonic diverticulosis without diverticulitis. Normal appendix. Vascular/Lymphatic: Advanced aortic atherosclerotic calcification. No lymphadenopathy. Reproductive: Hysterectomy.  No adnexal mass. Other: No free intraperitoneal fluid or air. Musculoskeletal: No acute fracture. IMPRESSION: 1. Small-bowel obstruction with possible transition point in the low anterior abdomen. 2. Thickening  of the wall of the small bowel in the pelvis compatible with nonspecific enteritis. 3. Marked cardiomegaly with moderate partially visualized pericardial effusion. 4. Aortic Atherosclerosis (ICD10-I70.0). Electronically Signed   By: Norman Gatlin M.D.   On: 10/25/2023 01:46        Scheduled Meds:  atorvastatin   20 mg Oral Daily   calcium  carbonate  1 tablet Oral BID WC    enoxaparin  (LOVENOX ) injection  40 mg Subcutaneous Q24H   escitalopram   5 mg Oral QHS   ferrous sulfate   325 mg Oral TID WC   levothyroxine   112 mcg Oral QAC breakfast   metoprolol  tartrate  50 mg Oral Q8H   mirtazapine   15 mg Oral QHS   pantoprazole   40 mg Oral Daily   Continuous Infusions:  dextrose  5% lactated ringers  60 mL/hr at 10/26/23 1027     LOS: 1 day    Time spent: 55 minutes    Derelle Cockrell D Maree, DO [facility]  If 7PM-7AM, please contact night-coverage [email or web address] 10/26/2023, 1:13 PM

## 2023-10-27 ENCOUNTER — Inpatient Hospital Stay (HOSPITAL_COMMUNITY)

## 2023-10-27 DIAGNOSIS — K56609 Unspecified intestinal obstruction, unspecified as to partial versus complete obstruction: Secondary | ICD-10-CM | POA: Diagnosis not present

## 2023-10-27 LAB — BASIC METABOLIC PANEL WITH GFR
Anion gap: 9 (ref 5–15)
BUN: 19 mg/dL (ref 8–23)
CO2: 25 mmol/L (ref 22–32)
Calcium: 8.3 mg/dL — ABNORMAL LOW (ref 8.9–10.3)
Chloride: 105 mmol/L (ref 98–111)
Creatinine, Ser: 0.94 mg/dL (ref 0.44–1.00)
GFR, Estimated: >60 mL/min (ref >60)
Glucose, Bld: 84 mg/dL (ref 70–99)
Potassium: 3.9 mmol/L (ref 3.5–5.1)
Sodium: 139 mmol/L (ref 135–145)

## 2023-10-27 LAB — CBC
HCT: 32.6 % — ABNORMAL LOW (ref 36.0–46.0)
Hemoglobin: 9.5 g/dL — ABNORMAL LOW (ref 12.0–15.0)
MCH: 25.1 pg — ABNORMAL LOW (ref 26.0–34.0)
MCHC: 29.1 g/dL — ABNORMAL LOW (ref 30.0–36.0)
MCV: 86 fL (ref 80.0–100.0)
Platelets: 221 K/uL (ref 150–400)
RBC: 3.79 MIL/uL — ABNORMAL LOW (ref 3.87–5.11)
RDW: 17.5 % — ABNORMAL HIGH (ref 11.5–15.5)
WBC: 6.9 K/uL (ref 4.0–10.5)
nRBC: 0 % (ref 0.0–0.2)

## 2023-10-27 LAB — MAGNESIUM: Magnesium: 1.9 mg/dL (ref 1.7–2.4)

## 2023-10-27 NOTE — Progress Notes (Signed)
 [facility] Progress Note     Subjective: No major issues reported. Having Bms and eating. Says she feels like she gets food stuck and that she had been getting worked up for them to do something. She is apologetic as she cannot communicate as thoroughly as she wishes.   Looked at chart. Prior CT with patulous esophagus, and concern for food in the area and risk of aspiration. GI saw in May and planned for EGD after cardiac clearance. Clearance scheduled for 11/21/23.   KUB with gas in the colon.  Objective: Vital signs in last 24 hours: Temp:  [97.7 F (36.5 C)-98.1 F (36.7 C)] 98.1 F (36.7 C) (07/06 0436) Pulse Rate:  [63-90] 63 (07/06 0547) Resp:  [17-18] 18 (07/06 0436) BP: (120-146)/(72-80) 146/80 (07/06 0547) SpO2:  [95 %] 95 % (07/06 0436) Last BM Date : 10/26/23  Intake/Output from previous day: 07/05 0701 - 07/06 0700 In: 768.4 [P.O.:480; I.V.:239.5; IV Piggyback:48.9] Out: -  Intake/Output this shift: No intake/output data recorded.  General appearance: alert and no distress GI: soft, nondistended, nontender  Lab Results:  Recent Labs    10/26/23 0309 10/27/23 0309  WBC 5.3 6.9  HGB 9.6* 9.5*  HCT 33.2* 32.6*  PLT 218 221   BMET Recent Labs    10/26/23 0309 10/27/23 0309  NA 140 139  K 4.1 3.9  CL 109 105  CO2 26 25  GLUCOSE 72 84  BUN 18 19  CREATININE 0.90 0.94  CALCIUM  8.1* 8.3*   PT/INR No results for input(s): LABPROT, INR in the last 72 hours.  Studies/Results: No results found.  Anti-infectives: Anti-infectives (From admission, onward)    None       Assessment/Plan: Patient with resolving SBO. Has a history of dysphagia and getting worked up for EGD as outpatient. I have notified Dr. Maree of patient's concerns too.   From SBO standpoint is having Bms and eating, bowel gas into the colon on KUB. Can dc home. Will defer to hospitalist with patient's new dysphagia complaints admission. This is being  worked up as outpatient currently.   Follow up with PCP.    LOS: 2 days    Manuelita JAYSON Pander 10/27/2023

## 2023-10-27 NOTE — Plan of Care (Signed)

## 2023-10-27 NOTE — Discharge Summary (Signed)
 Physician Discharge Summary  [name] FMW:[id number] DOB: 1940-08-16 DOA: 10/24/2023  PCP: Kathryn Harvey, FNP  Admit date: 10/24/2023  Discharge date: 10/27/2023  Admitted From:ALF  Disposition:  ALF  Recommendations for Outpatient Follow-up:  Follow up with PCP in 1-2 weeks Follow-up with cardiology as scheduled for cardiac clearance for upcoming EGD on 7/31 with Dr. Randine Bihari at 1:30 PM Continue home medications as prior  Home Health: None  Equipment/Devices: None  Discharge Condition:Stable  CODE STATUS: Full  Diet recommendation: Heart Healthy  Brief/Interim Summary: [name] is a 83 y.o. female with medical history significant for GERD, atrial fibrillation, hypertension, dyslipidemia, hypothyroidism and CVA, who presented to the emergency room with acute onset of recurrent vomiting for multiple times throughout the day.  Patient was admitted for SBO and appears to be improving clinically.  Repeat imaging shows improvement and she has been tolerating diet without any further nausea or vomiting and is having bowel movements.  She has been seen by general surgery and appears to be stable for discharge.  She will follow-up with cardiology for cardiac clearance for pending outpatient EGD for dysphagia.  Discharge Diagnoses:  Principal Problem:   SBO (small bowel obstruction) (HCC) Active Problems:   Atrial fibrillation with controlled ventricular rate (HCC)   Essential hypertension   Hypothyroidism   Dyslipidemia   Depression  Principal discharge diagnosis: SBO-resolved.  Discharge Instructions  Discharge Instructions     Diet - low sodium heart healthy   Complete by: As directed    Increase activity slowly   Complete by: As directed       Allergies as of 10/27/2023       Reactions   Sulfa Antibiotics Other (See Comments)   Unknown        Medication List     STOP taking these medications    acetaminophen  650 MG CR tablet Commonly known as:  TYLENOL    aspirin  EC 81 MG tablet       TAKE these medications    Acidophilus Probiotic Blend Caps Take 1 capsule by mouth daily.   apixaban  2.5 MG Tabs tablet Commonly known as: ELIQUIS  Take 1 tablet (2.5 mg total) by mouth 2 (two) times daily.   atorvastatin  20 MG tablet Commonly known as: LIPITOR Take 20 mg by mouth daily.   Biofreeze 4 % Gel Generic drug: Menthol (Topical Analgesic) Apply 1 application topically See admin instructions. Apply to right shoulder twice a day   calcium  carbonate 500 MG chewable tablet Commonly known as: TUMS - dosed in mg elemental calcium  Chew 1 tablet by mouth 2 (two) times daily with a meal.   Clobetasol Propionate 0.05 % shampoo Apply 1 application  topically See admin instructions. On shower day   escitalopram  5 MG tablet Commonly known as: LEXAPRO  Take 2.5 mg by mouth at bedtime.   ferrous sulfate  325 (65 FE) MG tablet Take 325 mg by mouth 3 (three) times daily with meals.   furosemide  20 MG tablet Commonly known as: LASIX  Take 20 mg by mouth daily.   levothyroxine  112 MCG tablet Commonly known as: SYNTHROID  Take 112 mcg by mouth daily before breakfast.   metoprolol  tartrate 50 MG tablet Commonly known as: LOPRESSOR  Take 1 tablet (50 mg total) by mouth every 8 (eight) hours.   mirtazapine  15 MG tablet Commonly known as: REMERON  Take 15 mg by mouth at bedtime.   mupirocin  ointment 2 % Commonly known as: BACTROBAN  Apply 1 Application topically daily.   omeprazole 20 MG capsule Commonly  known as: PRILOSEC Take 20 mg by mouth daily.   OXYGEN Inhale 2 L into the lungs 2 (two) times daily as needed (Shortness of breath and when oxygen levels drops below 92%).   polyethylene glycol 17 g packet Commonly known as: MIRALAX  / GLYCOLAX  Take 17 g by mouth daily as needed for mild constipation or moderate constipation.   promethazine 12.5 MG tablet Commonly known as: PHENERGAN Take 12.5 mg by mouth daily as needed for  nausea or vomiting.   Salicylic Acid 50 % Soln by Does not apply route.   SYSTANE BALANCE OP Place 1 drop into both eyes 2 (two) times daily.   vitamin B-12 500 MCG tablet Commonly known as: CYANOCOBALAMIN Take 0.5 tablets (250 mcg total) by mouth daily.        Follow-up Information     Kathryn Harvey, FNP. Schedule an appointment as soon as possible for a visit in 1 week(s).   Specialty: Family Medicine Contact information: [address] [place] [address] [phone]                Allergies  Allergen Reactions   Sulfa Antibiotics Other (See Comments)    Unknown    Consultations: General surgery   Procedures/Studies: DG Abd 1 View Result Date: 10/27/2023 CLINICAL DATA:  Small-bowel obstruction EXAM: ABDOMEN - 1 VIEW COMPARISON:  10/25/2023 FINDINGS: Two supine frontal views of the abdomen and pelvis are obtained. Bowel gas pattern is unremarkable, with no evidence of obstruction or ileus. No masses or abnormal calcifications. Lung bases are clear. No acute bony abnormalities. IMPRESSION: 1. Unremarkable bowel gas pattern. The distended fluid-filled loops of bowel seen on previous CT are not well visualized by x-ray. Electronically Signed   By: Ozell Daring M.D.   On: 10/27/2023 10:02   DG Abd 2 Views Result Date: 10/25/2023 CLINICAL DATA:  83 year old female with abdominal pain and vomiting. Small-bowel obstruction suspected by CT. EXAM: ABDOMEN - 2 VIEW COMPARISON:  CT Abdomen and Pelvis 0111 hours today. FINDINGS: Portable AP semi upright and portable AP supine views at 0531 hours. Calcified aortic atherosclerosis. Cardiomegaly, small pericardial effusion by CT. Stable lung bases. IV contrast in nondilated renal collecting systems, bladder. Unchanged bowel gas pattern from CT scout view this morning. No pneumoperitoneum. Stable cholecystectomy clips. No acute osseous abnormality identified. IMPRESSION: 1. Unchanged bowel gas pattern from CT this morning. No  pneumoperitoneum. 2. Cardiomegaly, small pericardial effusion by CT. 3. Aortic Atherosclerosis (ICD10-I70.0). Electronically Signed   By: VEAR Hurst M.D.   On: 10/25/2023 08:22   CT ABDOMEN PELVIS W CONTRAST Result Date: 10/25/2023 CLINICAL DATA:  Abdominal pain and emesis. EXAM: CT ABDOMEN AND PELVIS WITH CONTRAST TECHNIQUE: Multidetector CT imaging of the abdomen and pelvis was performed using the standard protocol following bolus administration of intravenous contrast. RADIATION DOSE REDUCTION: This exam was performed according to the departmental dose-optimization program which includes automated exposure control, adjustment of the mA and/or kV according to patient size and/or use of iterative reconstruction technique. CONTRAST:  OMNIPAQUE  IOHEXOL  300 MG/ML  SOLN COMPARISON:  CT pelvis 08/21/2021 FINDINGS: Lower chest: Marked cardiomegaly. Moderate partially visualized pericardial effusion. Hepatobiliary: Cholecystectomy.  No acute abnormality. Pancreas: Unremarkable. Spleen: Unremarkable. Adrenals/Urinary Tract: Normal adrenal glands. Cortical renal scarring in the left kidney. Punctate nonobstructing bilateral nephrolithiasis versus vascular calcifications. Unremarkable bladder. Stomach/Bowel: Multiple loops of dilated small bowel with suggestion of transition point in low anterior abdomen. Thickening of the wall of the small bowel in the pelvis compatible  with enteritis. Normal caliber colon. Colonic diverticulosis without diverticulitis. Normal appendix. Vascular/Lymphatic: Advanced aortic atherosclerotic calcification. No lymphadenopathy. Reproductive: Hysterectomy.  No adnexal mass. Other: No free intraperitoneal fluid or air. Musculoskeletal: No acute fracture. IMPRESSION: 1. Small-bowel obstruction with possible transition point in the low anterior abdomen. 2. Thickening of the wall of the small bowel in the pelvis compatible with nonspecific enteritis. 3. Marked cardiomegaly with moderate partially  visualized pericardial effusion. 4. Aortic Atherosclerosis (ICD10-I70.0). Electronically Signed   By: Norman Gatlin M.D.   On: 10/25/2023 01:46     Discharge Exam: Vitals:   10/27/23 0436 10/27/23 0547  BP: 120/72 (!) 146/80  Pulse: 65 63  Resp: 18   Temp: 98.1 F (36.7 C)   SpO2: 95%    Vitals:   10/26/23 2047 10/26/23 2053 10/27/23 0436 10/27/23 0547  BP: 130/76 130/76 120/72 (!) 146/80  Pulse: 90 90 65 63  Resp: 18  18   Temp: 97.7 F (36.5 C)  98.1 F (36.7 C)   TempSrc: Oral  Oral   SpO2:   95%   Weight:      Height:        General: Pt is alert, awake, not in acute distress Cardiovascular: RRR, S1/S2 +, no rubs, no gallops Respiratory: CTA bilaterally, no wheezing, no rhonchi Abdominal: Soft, NT, ND, bowel sounds + Extremities: no edema, no cyanosis    The results of significant diagnostics from this hospitalization (including imaging, microbiology, ancillary and laboratory) are listed below for reference.     Microbiology: No results found for this or any previous visit (from the past 240 hours).   Labs: BNP (last 3 results) Recent Labs    07/30/23 1050  BNP 147.0*   Basic Metabolic Panel: Recent Labs  Lab 10/24/23 2338 10/25/23 0424 10/26/23 0309 10/27/23 0309  NA 138 137 140 139  K 3.7 3.8 4.1 3.9  CL 100 104 109 105  CO2 25 26 26 25   GLUCOSE 142* 116* 72 84  BUN 22 20 18 19   CREATININE 0.91 0.78 0.90 0.94  CALCIUM  9.1 8.6* 8.1* 8.3*  MG  --   --  1.4* 1.9   Liver Function Tests: Recent Labs  Lab 10/24/23 2338  AST 17  ALT 11  ALKPHOS 109  BILITOT 1.0  PROT 7.4  ALBUMIN 3.5   Recent Labs  Lab 10/24/23 2338  LIPASE 55*   No results for input(s): AMMONIA in the last 168 hours. CBC: Recent Labs  Lab 10/24/23 2338 10/25/23 0424 10/26/23 0309 10/27/23 0309  WBC 9.8 8.9 5.3 6.9  NEUTROABS 7.5  --   --   --   HGB 11.7* 11.0* 9.6* 9.5*  HCT 37.4 35.5* 33.2* 32.6*  MCV 83.9 84.9 87.8 86.0  PLT 279 265 218 221    Cardiac Enzymes: No results for input(s): CKTOTAL, CKMB, CKMBINDEX, TROPONINI in the last 168 hours. BNP: Invalid input(s): POCBNP CBG: Recent Labs  Lab 10/25/23 0708  GLUCAP 100*   D-Dimer No results for input(s): DDIMER in the last 72 hours. Hgb A1c No results for input(s): HGBA1C in the last 72 hours. Lipid Profile No results for input(s): CHOL, HDL, LDLCALC, TRIG, CHOLHDL, LDLDIRECT in the last 72 hours. Thyroid function studies Recent Labs    10/25/23 0424  TSH 2.601   Anemia work up No results for input(s): VITAMINB12, FOLATE, FERRITIN, TIBC, IRON, RETICCTPCT in the last 72 hours. Urinalysis    Component Value Date/Time   COLORURINE YELLOW 10/25/2023 0203   APPEARANCEUR HAZY (  A) 10/25/2023 0203   LABSPEC 1.015 10/25/2023 0203   PHURINE 7.0 10/25/2023 0203   GLUCOSEU NEGATIVE 10/25/2023 0203   HGBUR SMALL (A) 10/25/2023 0203   BILIRUBINUR NEGATIVE 10/25/2023 0203   KETONESUR NEGATIVE 10/25/2023 0203   PROTEINUR NEGATIVE 10/25/2023 0203   NITRITE NEGATIVE 10/25/2023 0203   LEUKOCYTESUR TRACE (A) 10/25/2023 0203   Sepsis Labs Recent Labs  Lab 10/24/23 2338 10/25/23 0424 10/26/23 0309 10/27/23 0309  WBC 9.8 8.9 5.3 6.9   Microbiology No results found for this or any previous visit (from the past 240 hours).   Time coordinating discharge: 35 minutes  SIGNED:   Adron JONETTA Fairly, DO [facility] 10/27/2023, 11:00 AM  If 7PM-7AM, please contact night-coverage [email or web address]

## 2023-10-27 NOTE — Progress Notes (Signed)
 This nurse attempted to call [facility] to give report on patient returning, but voicemail is full and no answer. IV removed, patient awaiting EMS transport at this time.

## 2023-10-27 NOTE — TOC Transition Note (Signed)
 Transition of Care [facility]) - Discharge Note  Patient Details  Name: [name] MRN: [id number] Date of Birth: 02/25/1941  Transition of Care [facility]) CM/SW Contact:  Nena LITTIE Coffee, RN Phone Number: 10/27/2023, 11:50 AM  Clinical Narrative:    Pt will dc back to [facility] today. She is to follow-up c/neuro and GI as an outpt.   Final next level of care: [facility]isted [facility], [address], [place], [place] [address]  Phone: [phone]) Barriers to Discharge: Barriers Resolved  Patient Goals and CMS Choice    Discharge Placement    Discharge Plan and Services Additional resources added to the After Visit Summary for     Social Drivers of Health (SDOH) Interventions SDOH Screenings   Food Insecurity: No Food Insecurity (10/25/2023)  Housing: Unknown (10/25/2023)  Transportation Needs: No Transportation Needs (10/25/2023)  Utilities: Not At Risk (10/25/2023)  Social Connections: Patient Declined (10/25/2023)  Tobacco Use: Medium Risk (10/24/2023)   Readmission Risk Interventions    08/11/2021   10:29 AM 08/09/2021    2:34 PM  Readmission Risk Prevention Plan  Transportation Screening Complete Complete  PCP or Specialist Appt within 3-5 Days Complete   HRI or Home Care Consult Complete Complete  Social Work Consult for Recovery Care Planning/Counseling Complete Complete  Palliative Care Screening Not Applicable Not Applicable  Medication Review Oceanographer) Complete Complete

## 2023-11-21 ENCOUNTER — Ambulatory Visit: Attending: Cardiology | Admitting: Cardiology

## 2023-11-21 ENCOUNTER — Encounter: Payer: Self-pay | Admitting: *Deleted

## 2023-11-21 ENCOUNTER — Encounter: Payer: Self-pay | Admitting: Cardiology

## 2023-11-21 VITALS — BP 118/62 | HR 109 | Ht 67.0 in | Wt 99.6 lb

## 2023-11-21 DIAGNOSIS — I482 Chronic atrial fibrillation, unspecified: Secondary | ICD-10-CM | POA: Insufficient documentation

## 2023-11-21 DIAGNOSIS — Z01818 Encounter for other preprocedural examination: Secondary | ICD-10-CM | POA: Diagnosis present

## 2023-11-21 MED ORDER — METOPROLOL TARTRATE 100 MG PO TABS: 100.0000 mg | ORAL_TABLET | Freq: Two times a day (BID) | ORAL | 3 refills | Status: DC

## 2023-11-21 NOTE — Telephone Encounter (Signed)
 [facility] and spoke with [place]. She scheduled patient EGD +/-ed with Dr. Cinderella ASA 3 on 8/12. She needed an arrival time now. She advised me to send instructions and pre-op to [phone]

## 2023-11-21 NOTE — Addendum Note (Signed)
 Addended by: JANIT GENI CROME on: 11/21/2023 01:33 PM   Modules accepted: Orders

## 2023-11-21 NOTE — Progress Notes (Signed)
 Cardiology CONSULT Note    Date:  11/21/2023   ID:  [name], DOB 1941-03-12, MRN [id number]  PCP:  Barbaraann Harvey, FNP  Cardiologist:  Wilbert Bihari, MD   Chief Complaint  Patient presents with   New Patient (Initial Visit)    Preoperative cardiac clearance and atrial fibrillation    Patient Profile: [name] is a 83 y.o. female who is being seen today for the evaluation of preoperative cardiac clearance at the request of Ahmed, Deatrice FALCON, MD.  History of Present Illness:  [name] is a 83 y.o. female who is being seen today for the evaluation of preoperative cardiac clearance at the request of Ahmed, Deatrice FALCON, MD.  This is a 83 year old female with a history of atrial fibrillation and CHF.  She also has a history of CREST syndrome, CVA, hypertension and hyperlipidemia.  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  She has been on chronic anticoagulation with Eliquis  and bradycardia rate control with carvedilol .  She was admitted on 10/24/2023 after presenting to the ER with recurrent vomiting and admitted with a small bowel obstruction.  Heart rate was adequately controlled and her DOAC was stopped for possible surgery.  CT had shown past she lists esophagus and concern for food in the area and risk of aspiration.  She was seen by GI and plan for EGD after cardiac clearance.  She is doing well from a cardiac standpoint denies any chest pain or pressure, shortness of breath, DOE, PND, orthopnea,  dizziness, LE edema, presyncope, syncope or palpitations.  No bleeding issues with her DOAC.  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   atorvastatin  (LIPITOR) 20 MG tablet Take 20 mg by mouth daily.   calcium  carbonate (TUMS - DOSED IN MG ELEMENTAL CALCIUM ) 500 MG chewable tablet Chew 1 tablet by mouth 2 (two) times daily with a meal.   Clobetasol Propionate 0.05 % shampoo Apply 1 application  topically See admin instructions. On shower day   escitalopram  (LEXAPRO ) 5 MG tablet Take 2.5 mg by mouth at bedtime.   ferrous sulfate  325 (65 FE) MG tablet Take 325 mg by mouth 3 (three) times daily with meals.   furosemide  (LASIX ) 20 MG tablet Take 20 mg by mouth daily.   levothyroxine  (SYNTHROID ) 112 MCG tablet Take 112 mcg by mouth daily before breakfast.   Menthol, Topical Analgesic, (BIOFREEZE) 4 % GEL Apply 1 application topically See admin instructions. Apply to right shoulder twice a day   metoprolol  tartrate (  LOPRESSOR ) 50 MG tablet Take 1 tablet (50 mg total) by mouth every 8 (eight) hours.   mirtazapine  (REMERON ) 15 MG tablet Take 15 mg by mouth at bedtime.   omeprazole (PRILOSEC) 20 MG capsule Take 20 mg by mouth daily.   OXYGEN Inhale 2 L into the lungs 2 (two) times daily as needed (Shortness of breath and when oxygen levels drops below 92%).   polyethylene glycol (MIRALAX  / GLYCOLAX ) 17 g packet Take 17 g by mouth daily as needed for mild constipation or moderate constipation.   Probiotic Product (ACIDOPHILUS PROBIOTIC BLEND) CAPS Take 1 capsule by mouth daily.   promethazine (PHENERGAN) 12.5 MG tablet Take  12.5 mg by mouth daily as needed for nausea or vomiting.   Propylene Glycol (SYSTANE BALANCE OP) Place 1 drop into both eyes 2 (two) times daily.   Salicylic Acid 50 % SOLN by Does not apply route.    Allergies:   Sulfa antibiotics   Social History   Socioeconomic History   Marital status: Legally Separated    Spouse name: Not on file   Number of children: 2   Years of education: 16   Highest education level: Not on file  Occupational History    Comment: retired  Tobacco Use   Smoking status: Former   Smokeless tobacco: Never  Vaping Use   Vaping status: Never Used  Substance and Sexual Activity   Alcohol use: No   Drug use: No   Sexual activity: Not on file  Other Topics Concern   Not on file  Social History Narrative   01/22/17 lives at [facility]   Social Drivers of Health   Financial Resource Strain: Not on file  Food Insecurity: No Food Insecurity (10/25/2023)   Hunger Vital Sign    Worried About Running Out of Food in the Last Year: Never true    Ran Out of Food in the Last Year: Never true  Transportation Needs: No Transportation Needs (10/25/2023)   PRAPARE - Administrator, Civil Service (Medical): No    Lack of Transportation (Non-Medical): No  Physical Activity: Not on file  Stress: Not on file  Social Connections: Patient Declined (10/25/2023)   Social Connection and Isolation Panel    Frequency of Communication with Friends and Family: Patient declined    Frequency of Social Gatherings with Friends and Family: Patient declined    Attends Religious Services: Patient declined    Database administrator or Organizations: Patient declined    Attends Banker Meetings: Patient declined    Marital Status: Patient declined     Family History:  The patient's Family history is unknown by patient.   ROS:   Please see the history of present illness.    ROS All other systems reviewed and are negative.     01/01/2017    1:03 PM  PAD  Screen  Previous PAD dx? Yes  Previous surgical procedure? No  Pain with walking? No  Feet/toe relief with dangling? Yes  Painful, non-healing ulcers? No  Extremities discolored? No       PHYSICAL EXAM:   VS:  BP 118/62   Pulse (!) 109   Ht 5' 7 (1.702 m)   Wt 99 lb 9.6 oz (45.2 kg)   SpO2 95%   BMI 15.60 kg/m    GEN: Well nourished, well developed, in no acute distress  HEENT: normal  Neck: no JVD, carotid bruits, or masses Cardiac:  irregularly irregular and tacky; no  murmurs, rubs, or gallops.  Trace edema.  Intact distal pulses bilaterally.  Respiratory:  clear to auscultation bilaterally, normal work of breathing GI: soft, nontender, nondistended, + BS MS: no deformity or atrophy  Skin: warm and dry, no rash Neuro:  Alert and Oriented x 3, Strength and sensation are intact Psych: euthymic mood, full affect  Wt Readings from Last 3 Encounters:  11/21/23 99 lb 9.6 oz (45.2 kg)  10/25/23 128 lb 15.5 oz (58.5 kg)  08/27/23 139 lb 11.2 oz (63.4 kg)      Studies/Labs Reviewed:   EKG Interpretation Date/Time:  Thursday November 21 2023 12:58:32 EDT Ventricular Rate:  109 PR Interval:    QRS Duration:  78 QT Interval:  320 QTC Calculation: 430 R Axis:   39  Text Interpretation: Atrial fibrillation with rapid ventricular response Nonspecific ST and T wave abnormality When compared with ECG of 25-Oct-2023 00:46, PREVIOUS ECG IS PRESENT Confirmed by Shlomo Corning ([id number]) on 11/21/2023 1:00:21 PM      Recent Labs: 07/30/2023: B Natriuretic Peptide 147.0 10/24/2023: ALT 11 10/25/2023: TSH 2.601 10/27/2023: BUN 19; Creatinine, Ser 0.94; Hemoglobin 9.5; Magnesium  1.9; Platelets 221; Potassium 3.9; Sodium 139   Lipid Panel No results found for: CHOL, TRIG, HDL, CHOLHDL, VLDL, LDLCALC, LDLDIRECT   [id number] Score = 7   This indicates a 11.2% annual risk of stroke. The patient's score is based upon: CHF History: 0 HTN History: 1 Diabetes History: 0 Stroke  History: 2 Vascular Disease History: 1 Age Score: 2 Gender Score: 1   ASSESSMENT:    1. Preoperative clearance   2. Chronic atrial fibrillation (HCC)      PLAN:  In order of problems listed above:  Preoperative clearance  Patient is scheduled for an EGD due to CT showing patulous esophagus and concern for-food in the area with aspiration - Her only cardiac issue is atrial fibrillation which is chronic and well rate controlled - She is asymptomatic from a cardiac standpoint -Preoperative cardiovascular examination Her perioperative risk of a major cardiac event is 0.9% according to the Revised Cardiac Risk Index (RCRI).  Therefore, the patient is at low risk for perioperative complications.   The patient's  functional capacity is poor as she uses a wheelchair .  THe EGD is a low risk procedure and patient is completely asymptomatic so ok to proceed with EGD.   Recommendations: According to ACC/AHA guidelines, no further cardiovascular testing needed.  The patient may proceed to surgery at acceptable risk.    Persistent atrial fibrillation - Heart rate today is 109 bpm in atrial fibrillation - No bleeding problems on DOAC - Continue Eliquis  2.5 mg twice daily entheses dosed for age greater than 80 and weight less than 60 kg) - Will increase Lopressor  to 100 mg twice daily for better rate control - Referred to A-fib clinic for follow-up - repeat 2D echo  Time Spent: 20 minutes total time of encounter, including 15 minutes spent in face-to-face patient care on the date of this encounter. This time includes coordination of care and counseling regarding above mentioned problem list. Remainder of non-face-to-face time involved reviewing chart documents/testing relevant to the patient encounter and documentation in the medical record. I have independently reviewed documentation from referring provider  Followup:  1 year  Medication Adjustments/Labs and Tests Ordered: Current medicines  are reviewed at length with the patient today.  Concerns regarding medicines are outlined above.  Medication changes, Labs and Tests ordered today are listed in the Patient Instructions below.  There are no Patient Instructions on file for this visit.   Signed, Wilbert Bihari, MD  11/21/2023 1:20 PM    [facility] [address] [place], [place], [place]  [address] Phone: [phone]; Fax: [phone]

## 2023-11-21 NOTE — Patient Instructions (Addendum)
 Medication Instructions:  Please INCREASE your dose of lopressor  to 100 mg twice a day.  *If you need a refill on your cardiac medications before your next appointment, please call your pharmacy*  Lab Work: None.  If you have labs (blood work) drawn today and your tests are completely normal, you will receive your results only by: MyChart Message (if you have MyChart) OR A paper copy in the mail If you have any lab test that is abnormal or we need to change your treatment, we will call you to review the results.  Testing/Procedures: Your physician has requested that you have an echocardiogram. Echocardiography is a painless test that uses sound waves to create images of your heart. It provides your doctor with information about the size and shape of your heart and how well your heart's chambers and valves are working. This procedure takes approximately one hour. There are no restrictions for this procedure. Please do NOT wear cologne, perfume, aftershave, or lotions (deodorant is allowed). Please arrive 15 minutes prior to your appointment time.  Please note: We ask at that you not bring children with you during ultrasound (echo/ vascular) testing. Due to room size and safety concerns, children are not allowed in the ultrasound rooms during exams. Our front office staff cannot provide observation of children in our lobby area while testing is being conducted. An adult accompanying a patient to their appointment will only be allowed in the ultrasound room at the discretion of the ultrasound technician under special circumstances. We apologize for any inconvenience.   Follow-Up: At [facility], you and your health needs are our priority.  As part of our continuing mission to provide you with exceptional heart care, our providers are all part of one team.  This team includes your primary Cardiologist (physician) and Advanced Practice Providers or APPs (Physician Assistants and Nurse  Practitioners) who all work together to provide you with the care you need, when you need it.  Your next appointment:   6 month(s)  Provider:   Dr. Wilbert Bihari, MD   We recommend signing up for the patient portal called MyChart.  Sign up information is provided on this After Visit Summary.  MyChart is used to connect with patients for Virtual Visits (Telemedicine).  Patients are able to view lab/test results, encounter notes, upcoming appointments, etc.  Non-urgent messages can be sent to your provider as well.   To learn more about what you can do with MyChart, go to [email or web address].   Other Instructions Please have your blood pressure and heart rate checked once a day. Write down your readings, as well as the date and time of hte readings. Then, please drop off your readings at our office. You can also call our main # at [phone] and give to the operator. Our fax # is [phone] if you would like to fax them.      You have been referred to our afib clinic. Someone will call you to set up an appointment.

## 2023-11-21 NOTE — Telephone Encounter (Signed)
 Ahmed, Deatrice FALCON, MD  Jeanell Graeme RAMAN, CMA; Carlan, Chelsea L, NP [facility] Tretter  Patient was seen by cardiology , can schedule the procedure   The EGD is a low risk procedure and patient is completely asymptomatic so ok to proceed with EGD.

## 2023-11-27 NOTE — Pre-Procedure Instructions (Signed)
  RE: note to hold eliquis  Received: Today Estudillo, Graeme RAMAN, CMA  Levelle Edelen V, RN I am not sure. We were following what was sent to us  by Dr. Cinderella to schedule.       Previous Messages    ----- Message ----- From: Rhenda Luke GAILS, RN Sent: 11/27/2023   2:06 PM EDT To: Madelin LITTIE Ponto, LPN; Graeme RAMAN Kitchens, CMA Subject: note to hold eliquis                           Good afternoon! I see the note on 7/31 that clears patient for procedure, but no note on if she can hold eliquis . I probably missed it though. CAn you direct my brain to the hold eliquis  part?

## 2023-11-28 ENCOUNTER — Encounter (HOSPITAL_COMMUNITY)
Admission: RE | Admit: 2023-11-28 | Discharge: 2023-11-28 | Disposition: A | Discharge status: Home / Self Care | Source: Ambulatory Visit | Attending: Gastroenterology | Admitting: Gastroenterology

## 2023-11-29 NOTE — Pre-Procedure Instructions (Signed)
 Attempted pre-op phone call to [facility]. VM is full and could not leave a message

## 2023-12-02 NOTE — Pre-Procedure Instructions (Signed)
 The owner call me back and states, the last dose of eliquis  [name] had was on 11/30/2023.

## 2023-12-02 NOTE — Pre-Procedure Instructions (Signed)
 Office messaged because we have mad multiple attempts to contact [facility]. 2 of the 3 numbers listed as contact numbers, VM is full and so we could not leave a message.     I contacted the owner at [phone]. We went over Diet and NPO status and meds to take in the morning. She will check withe the med tech and call me back if the patients eliquis  was not stopped on 11/30/2023.

## 2023-12-03 ENCOUNTER — Telehealth (INDEPENDENT_AMBULATORY_CARE_PROVIDER_SITE_OTHER): Payer: Self-pay | Admitting: *Deleted

## 2023-12-03 ENCOUNTER — Ambulatory Visit (HOSPITAL_COMMUNITY)
Admission: RE | Admit: 2023-12-03 | Discharge: 2023-12-03 | Disposition: A | Discharge status: Home / Self Care | Attending: Gastroenterology | Admitting: Gastroenterology

## 2023-12-03 ENCOUNTER — Other Ambulatory Visit: Payer: Self-pay

## 2023-12-03 ENCOUNTER — Ambulatory Visit (HOSPITAL_COMMUNITY): Admitting: Certified Registered"

## 2023-12-03 ENCOUNTER — Encounter (HOSPITAL_COMMUNITY): Payer: Self-pay | Admitting: Gastroenterology

## 2023-12-03 ENCOUNTER — Encounter (HOSPITAL_COMMUNITY)
Admission: RE | Disposition: A | Discharge status: Home / Self Care | Payer: Self-pay | Source: Home / Self Care | Attending: Gastroenterology

## 2023-12-03 HISTORY — PX: ESOPHAGEAL DILATION: SHX303

## 2023-12-03 HISTORY — PX: ESOPHAGOGASTRODUODENOSCOPY: SHX5428

## 2023-12-03 SURGERY — EGD (ESOPHAGOGASTRODUODENOSCOPY)
Anesthesia: General

## 2023-12-03 MED ORDER — LACTATED RINGERS IV SOLN: INTRAVENOUS | Status: DC

## 2023-12-03 NOTE — H&P (Addendum)
 [name] is a 83 y.o. female with past medical history of anemia, GERD, HLD, HTN, hypothyroidism, vasculitis  is here for EGD with dilation for abnormal CT and dysphagia .  Patient continues to have dysphagia ,held dose of Eliquis  3 days ago .  After anesthesia reviewed patients echocardiogram from 2023 ( repeat echo is pending ) with severe pulmonary artery hypertension  , patient was deemed high risk for non-emergent procedure to be done at [facility] and recommended procedure to be done at center with cardiac anesthesia due to increase perioperative risk .  I spoke to the patient in detail and will be referred to [facility] . She verbalizes understanding

## 2023-12-03 NOTE — Telephone Encounter (Signed)
 Referral sent, they will contact patient with apt

## 2023-12-03 NOTE — Anesthesia Preprocedure Evaluation (Addendum)
 Anesthesia Evaluation  Patient identified by MRN, date of birth, ID band Patient awake    Reviewed: Allergy & Precautions, H&P , NPO status , Patient's Chart, lab work & pertinent test results, reviewed documented beta blocker date and time   Airway Mallampati: II  TM Distance: >3 FB Neck ROM: full    Dental no notable dental hx. (+) Dental Advisory Given, Teeth Intact   Pulmonary pneumonia, resolved, COPD, former smoker   Pulmonary exam normal breath sounds clear to auscultation       Cardiovascular hypertension, pulmonary hypertension+ Peripheral Vascular Disease  Normal cardiovascular exam+ dysrhythmias Atrial Fibrillation  Rhythm:regular Rate:Normal  Severe pulmonary htn 2023 with moderate RV enlargement.  Echo is scheduled at present but not completed   Neuro/Psych  PSYCHIATRIC DISORDERS  Depression   Dementia CVA, Residual Symptoms    GI/Hepatic Neg liver ROS,GERD  ,,  Endo/Other  Hypothyroidism    Renal/GU negative Renal ROS  negative genitourinary   Musculoskeletal   Abdominal   Peds  Hematology  (+) Blood dyscrasia, anemia Hgb 9.5   Anesthesia Other Findings   Reproductive/Obstetrics negative OB ROS                              Anesthesia Physical Anesthesia Plan  ASA: 4  Anesthesia Plan: General   Post-op Pain Management:    Induction:   PONV Risk Score and Plan:   Airway Management Planned:   Additional Equipment: None  Intra-op Plan:   Post-operative Plan:   Informed Consent: I have reviewed the patients History and Physical, chart, labs and discussed the procedure including the risks, benefits and alternatives for the proposed anesthesia with the patient or authorized representative who has indicated his/her understanding and acceptance.     Dental Advisory Given  Plan Discussed with: CRNA, Surgeon and Anesthesiologist  Anesthesia Plan Comments: (Better to  do the cardiac echo that is already scheduled before doing the endo procedure and if there is no improvement in the pulmonary hypertension we will send her to [facility] for her procedure where cardiac anesthesia is available.)         Anesthesia Quick Evaluation

## 2023-12-03 NOTE — Telephone Encounter (Signed)
-----   Message from Deatrice JULIANNA Dine sent at 12/03/2023  1:44 PM EDT ----- Regarding: referral [facility]  Patient presented for EGD today but was cancelled by anesthesia as she is too high risk for to be done at [place] ( severe pulmonary hypertension ) . Can you please refer this patient to [facility] - urgent, to be seen in clinic first  ?

## 2023-12-03 NOTE — OR Nursing (Signed)
 Patient ready for procedure preoperatively. Anesthesia and Dr, Cinderella cancelled procedure due to patient needing an echo. Patient has an echo scheduled to be done in the next two weeks.

## 2023-12-04 ENCOUNTER — Encounter (HOSPITAL_COMMUNITY): Payer: Self-pay | Admitting: Gastroenterology

## 2023-12-06 ENCOUNTER — Telehealth: Payer: Self-pay | Admitting: Internal Medicine

## 2023-12-06 NOTE — Telephone Encounter (Signed)
 Dr. Federico,  Urgent referral received.  Per Dr Cinderella -- Patient presented for EGD today but was cancelled by anesthesia as she is too high risk for to be done at [place] ( severe pulmonary hypertension ) .  Recently seen at Dr. Danielle office.  Please review and advise scheduling.  Thanks [employer]  DOD 8/15/25am

## 2023-12-06 NOTE — Telephone Encounter (Signed)
 Tentatively scheduled patient for 8/21 at 1:30 pm with Delon, [place]. Unable to reach patient, VM box full. Also tried assisted living center, but sent to voicemail as well.

## 2023-12-09 NOTE — Telephone Encounter (Signed)
 [name] [phone] was contacted and left message to call back.

## 2023-12-10 ENCOUNTER — Ambulatory Visit (HOSPITAL_COMMUNITY)
Admission: RE | Admit: 2023-12-10 | Discharge: 2023-12-10 | Disposition: A | Discharge status: Home / Self Care | Source: Ambulatory Visit | Attending: Cardiology | Admitting: Cardiology

## 2023-12-10 DIAGNOSIS — I482 Chronic atrial fibrillation, unspecified: Secondary | ICD-10-CM | POA: Diagnosis present

## 2023-12-10 DIAGNOSIS — Z01818 Encounter for other preprocedural examination: Secondary | ICD-10-CM | POA: Insufficient documentation

## 2023-12-10 LAB — ECHOCARDIOGRAM COMPLETE
Area-P 1/2: 4.8 cm2
MV M vel: 4.64 m/s
MV Peak grad: 86.1 mmHg
MV VTI: 1.08 cm2
S' Lateral: 2.7 cm

## 2023-12-10 NOTE — Progress Notes (Signed)
*  PRELIMINARY RESULTS* Echocardiogram 2D Echocardiogram has been performed.  [name] 12/10/2023, 4:05 PM

## 2023-12-10 NOTE — Telephone Encounter (Signed)
 Unable to leave message at [phone] [facility] assisted [facility] due to voice mailbox being full.  Unable to reach pt son or other contact in pt chart.

## 2023-12-11 ENCOUNTER — Telehealth: Payer: Self-pay

## 2023-12-11 ENCOUNTER — Ambulatory Visit: Payer: Self-pay | Admitting: Cardiology

## 2023-12-11 DIAGNOSIS — I272 Pulmonary hypertension, unspecified: Secondary | ICD-10-CM

## 2023-12-11 NOTE — Telephone Encounter (Signed)
 Attempted to reach [facility] assisted living at [phone] & [phone]. Unable to leave a voicemail at either number.   Called patient's son [name] - left a detailed vm letting him know that we are trying to relay appt information for his mother but we are not able to get in contact with her assisted living facility. I have asked Zack to call back to discuss appt.

## 2023-12-11 NOTE — Telephone Encounter (Signed)
-----   Message from [name] sent at 12/11/2023  9:58 AM EDT ----- Echo showed normal heart function EF 60 to 65%, severe biatrial enlargement, moderate pericardial effusion, mildly leaky mitral valve, severe leakiness of the tricuspid valve.  Compared to 2023  leakiness of the tricuspid valve is now severe.  Right ventricular function is mildly reduced.  There is evidence of moderate pulmonary hypertension with PASP almost 50 mmHg.  Please refer to Dr.  Rolan with advanced heart failure for pulmonary hypertension. ----- Message ----- From: Interface, [employer] Sent: 12/10/2023   4:24 PM EDT To: Kathryn JONELLE Bihari, MD

## 2023-12-11 NOTE — Telephone Encounter (Signed)
 Call to patient to discuss echo results. NO answer, LVM asking patient to call back to discuss echo results. Order for advanced heart failure clinic placed.

## 2023-12-11 NOTE — Telephone Encounter (Signed)
 Call to patient to try to get BP readings. No answer, LVM per DPR.

## 2023-12-12 ENCOUNTER — Ambulatory Visit: Admitting: Physician Assistant

## 2023-12-12 NOTE — Telephone Encounter (Signed)
 Spoke with Luke from [facility] who handle's patient's appointments [phone]) & patient will not be able to make today's appointment d/t transportation, so OV scheduled for 8/26 at 1:30 pm.

## 2023-12-17 ENCOUNTER — Ambulatory Visit: Admitting: Physician Assistant

## 2023-12-19 NOTE — Telephone Encounter (Signed)
 Call to patient, a staff member at [facility] answered. Explained that I was trying to reach patient to talk about referral to Heart Failure MD, staff member stated they could make an appointment for patient but to call back when it wasn't lunch time.   Call to Gilmer Salvo listed on DPR, left message asking him to call our office in regards to patient and gave call back #.   Letter also sent.

## 2023-12-19 NOTE — Telephone Encounter (Signed)
-----   Message from Wilbert Bihari sent at 12/11/2023  9:58 AM EDT ----- Echo showed normal heart function EF 60 to 65%, severe biatrial enlargement, moderate pericardial effusion, mildly leaky mitral valve, severe leakiness of the tricuspid valve.  Compared to 2023  leakiness of the tricuspid valve is now severe.  Right ventricular function is mildly reduced.  There is evidence of moderate pulmonary hypertension with PASP almost 50 mmHg.  Please refer to Dr.  Rolan with advanced heart failure for pulmonary hypertension. ----- Message ----- From: Interface, [employer] Sent: 12/10/2023   4:24 PM EDT To: [name], MD

## 2023-12-31 ENCOUNTER — Ambulatory Visit (INDEPENDENT_AMBULATORY_CARE_PROVIDER_SITE_OTHER): Admitting: Gastroenterology

## 2023-12-31 ENCOUNTER — Encounter (INDEPENDENT_AMBULATORY_CARE_PROVIDER_SITE_OTHER): Payer: Self-pay | Admitting: Gastroenterology

## 2023-12-31 VITALS — BP 138/87 | HR 112 | Temp 98.0°F | Ht 66.0 in | Wt 137.5 lb

## 2023-12-31 DIAGNOSIS — R1310 Dysphagia, unspecified: Secondary | ICD-10-CM | POA: Diagnosis not present

## 2023-12-31 DIAGNOSIS — G3184 Mild cognitive impairment, so stated: Secondary | ICD-10-CM

## 2023-12-31 DIAGNOSIS — R933 Abnormal findings on diagnostic imaging of other parts of digestive tract: Secondary | ICD-10-CM

## 2023-12-31 NOTE — Progress Notes (Signed)
 Referring Provider: Barbaraann Harvey, FNP Primary Care Physician:  Barbaraann Harvey, FNP Primary GI Physician: Dr. Cinderella   Chief Complaint  Patient presents with   Dysphagia    Patient here today for a follow up on Dysphagia. Patient says she is not having any current issues with this.    HPI:   [name] is a 83 y.o. female with past medical history of anemia, GERD, HLD, HTN, hypothyroidism, vasculitis   Patient presenting today for follow up: Dysphagia, abnormal CT of esophagus  Last seen may, at that time, had recent CT with diffusely patulous esophagus, containing large volume ingested material, endorsed dysphagia, taking omeprazole 20mg  daily.   Recommend EGD, continue omeprazole 20mg  daily, consider BPE if EGD unremarkable  EGD planned for 8/12 however due to severe pulmonary artery htn, anesthesia deemed patient high risk and she was referred to have procedure at [facility], has appt on 10/22 for evaluation for EGD  Present:  Feels dysphagia may be some better than previously though she is not able to provide much history for me on this. She still tries to be careful when eating or taking medicine. She denies any heartburn. She feels that her appetite is good and she is eating okay. No nausea or vomiting. Has gained some weight since her last visit. Med tech with the patient reports she has not had any episodes of choking, she takes her pills with yogurt and takes her time eating but seems to do pretty well with swallowing. Med tech is requesting an order to make sure patient can get yogurt with her pills as this seems beneficial for her.    denies melena, hematochezia, nausea, vomiting, diarrhea, constipation, odyonophagia, early satiety or weight loss.    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   Vision abnormalities    as result of stroke   Xerophthalmia     Past Surgical History:  Procedure Laterality Date   ABDOMINAL AORTOGRAM W/LOWER EXTREMITY Left 12/14/2022   Procedure: ABDOMINAL AORTOGRAM W/LOWER EXTREMITY;  Surgeon: Magda Debby SAILOR, MD;  Location: [facility];  Service: Cardiovascular;  Laterality: Left;   CHOLECYSTECTOMY     ESOPHAGEAL DILATION N/A 12/03/2023   Procedure: DILATION, ESOPHAGUS;  Surgeon: Cinderella Deatrice FALCON, MD;  Location: [facility];  Service: Endoscopy;  Laterality: N/A;   ESOPHAGOGASTRODUODENOSCOPY N/A 12/03/2023   Procedure: EGD (ESOPHAGOGASTRODUODENOSCOPY);  Surgeon: Cinderella Deatrice FALCON, MD;  Location: [facility];  Service: Endoscopy;  Laterality: N/A;  1:30PM, ASA 3 RESIDENT AT [facility]   leg surgery     TONSILLECTOMY      Current Outpatient Medications  Medication Sig Dispense Refill   apixaban  (ELIQUIS ) 2.5 MG TABS tablet Take 1 tablet (2.5 mg total) by mouth 2 (two) times daily. 60 tablet 0   atorvastatin  (LIPITOR) 20 MG tablet Take 20 mg by mouth daily.     calcium  carbonate (TUMS - DOSED IN MG ELEMENTAL CALCIUM ) 500 MG chewable tablet Chew 1 tablet by mouth 2 (two) times daily with a meal.     Clobetasol Propionate 0.05 % shampoo Apply 1 application  topically See admin instructions. On shower day     escitalopram  (LEXAPRO ) 5 MG tablet  Take 2.5 mg by mouth at bedtime.     ferrous sulfate  325 (65 FE) MG tablet Take 325 mg by mouth 3 (three) times daily with meals. (Patient taking differently: Take 325 mg by mouth 2 (two) times daily with a meal.)     furosemide  (LASIX ) 20 MG tablet Take 20 mg by mouth daily.     levothyroxine  (SYNTHROID ) 112 MCG tablet Take 112 mcg by mouth daily before breakfast.     Menthol, Topical Analgesic, (BIOFREEZE) 4 % GEL Apply 1 application topically See admin instructions. Apply to right shoulder twice a day     metoprolol  tartrate (LOPRESSOR )  100 MG tablet Take 1 tablet (100 mg total) by mouth 2 (two) times daily. 180 tablet 3   mirtazapine  (REMERON ) 15 MG tablet Take 15 mg by mouth at bedtime.     omeprazole (PRILOSEC) 20 MG capsule Take 20 mg by mouth daily.     OXYGEN Inhale 2 L into the lungs 2 (two) times daily as needed (Shortness of breath and when oxygen levels drops below 92%).     polyethylene glycol (MIRALAX  / GLYCOLAX ) 17 g packet Take 17 g by mouth daily as needed for mild constipation or moderate constipation. 14 each 0   Probiotic Product (ACIDOPHILUS PROBIOTIC BLEND) CAPS Take 1 capsule by mouth daily.     promethazine (PHENERGAN) 12.5 MG tablet Take 12.5 mg by mouth daily as needed for nausea or vomiting.     Propylene Glycol (SYSTANE BALANCE OP) Place 1 drop into both eyes 2 (two) times daily.     Salicylic Acid 50 % SOLN by Does not apply route. (Patient taking differently: by Does not apply route daily at 6 (six) AM.)     No current facility-administered medications for this visit.    Allergies as of 12/31/2023 - Review Complete 12/31/2023  Allergen Reaction Noted   Sulfa antibiotics Other (See Comments) 01/01/2017    Social History   Socioeconomic History   Marital status: Legally Separated    Spouse name: Not on file   Number of children: 2   Years of education: 16   Highest education level: Not on file  Occupational History    Comment: retired  Tobacco Use   Smoking status: Former   Smokeless tobacco: Never  Vaping Use   Vaping status: Never Used  Substance and Sexual Activity   Alcohol use: No   Drug use: No   Sexual activity: Not on file  Other Topics Concern   Not on file  Social History Narrative   01/22/17 lives at [facility]   Social Drivers of Health   Financial Resource Strain: Not on file  Food Insecurity: No Food Insecurity (10/25/2023)   Hunger Vital Sign    Worried About Running Out of Food in the Last Year: Never true    Ran Out of Food in the Last Year: Never true   Transportation Needs: No Transportation Needs (10/25/2023)   PRAPARE - Administrator, Civil Service (Medical): No    Lack of Transportation (Non-Medical): No  Physical Activity: Not on file  Stress: Not on file  Social Connections: Patient Declined (10/25/2023)   Social Connection and Isolation Panel    Frequency of Communication with Friends and Family: Patient declined    Frequency of Social Gatherings with Friends and Family: Patient declined    Attends Religious Services: Patient declined    Database administrator or Organizations: Patient declined    Attends Banker  Meetings: Patient declined    Marital Status: Patient declined    Review of systems General: negative for malaise, night sweats, fever, chills, weight loss Neck: Negative for lumps, goiter, pain and significant neck swelling Resp: Negative for cough, wheezing, dyspnea at rest CV: Negative for chest pain, leg swelling, palpitations, orthopnea GI: denies melena, hematochezia, nausea, vomiting, diarrhea, constipation, dysphagia, odyonophagia, early satiety or unintentional weight loss.  Derm: Negative for itching or rash Psych: Denies depression, anxiety, memory loss, confusion. No homicidal or suicidal ideation.  Heme: Negative for prolonged bleeding, bruising easily, and swollen nodes. Endocrine: Negative for cold or heat intolerance, polyuria, polydipsia and goiter. Neuro: negative for tremor, gait imbalance, syncope and seizures. The remainder of the review of systems is noncontributory.  Physical Exam: BP 138/87 (BP Location: Left Arm, Patient Position: Sitting, Cuff Size: Normal)   Pulse (!) 112   Temp 98 F (36.7 C) (Temporal)   Ht 5' 6 (1.676 m)   Wt 137 lb 8 oz (62.4 kg)   BMI 22.19 kg/m  General:   Alert and oriented. No distress noted. Pleasant and cooperative. In wheelchair  Head:  Normocephalic and atraumatic. Eyes:  Conjuctiva clear without scleral icterus. Mouth:  Oral  mucosa pink and moist. Good dentition. No lesions. Heart: Normal rate and rhythm, s1 and s2 heart sounds present.  Lungs: Clear lung sounds in all lobes. Respirations equal and unlabored. Abdomen:  +BS, soft, non-tender and non-distended. No rebound or guarding. No HSM or masses noted. Neurologic:  Alert and  oriented x4 Psych:  Alert and cooperative. Normal mood and affect.  Invalid input(s): 6 MONTHS   ASSESSMENT: [name] is a 83 y.o. female presenting today for follow up of dysphagia and abnormal CT of esophagus  Previously with dysphagia, abnormal CT of esophagus, recommended to have EGD though was deemed too high risk to undergo procedure here at [facility] and was referred to [facility] for further evaluation/possible EGD at [facility] for which she has an appt next month. 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 Her med tech present endorses she does eat slowly but has had no issues with choking, takes meds in yogurt applesauce which she requested an order to continue as not all the med techs administer her pills this way given no current order. She has no heartburn or acid regurgitation and appetite sounds to be good. At this time, would recommend keeping appt with [facility] next month given previously abnormal CT of the esophagus, continue chewing/swallowing precautions and low dose PPI.    PLAN:  -continue omeprazole 20mg  daily -Continue chewing precautions with small bites, softer foods, taking sips of liquids between bites and chewing thoroughly -continue to take pills with yogurt or applesauce -Keep appt with [facility] next month in regards to evaluation for possible EGD due to abnormal CT of the esophagus, questionable intermittent dysphagia  All questions were answered, patient verbalized understanding and is in agreement with plan as outlined above.    Follow Up: 4 months    Yussuf Sawyers L. Ramces Shomaker, MSN, APRN, AGNP-C Adult-Gerontology Nurse Practitioner [facility]

## 2023-12-31 NOTE — Patient Instructions (Signed)
 Please continue omeprazole 20mg  daily Continue chewing precautions with small bites, softer foods, taking sips of liquids between bites and chewing thoroughly You should continue to take pills with yogurt or applesauce Keep appt with [facility] next month in regards to evaluation for possible EGD due to issues with swallowing and previously abnormal CT of the esophagus  Follow up 4 months

## 2024-01-29 NOTE — Telephone Encounter (Signed)
 Kim with [facility] called in for pt test results after receiving letter

## 2024-02-03 ENCOUNTER — Other Ambulatory Visit: Payer: Self-pay

## 2024-02-12 ENCOUNTER — Ambulatory Visit: Admitting: Physician Assistant

## 2024-02-12 ENCOUNTER — Telehealth (HOSPITAL_COMMUNITY): Payer: Self-pay | Admitting: Cardiology

## 2024-02-12 NOTE — Progress Notes (Signed)
   ADVANCED HEART FAILURE NEW PATIENT CLINIC NOTE  Referring Physician: Barbaraann Harvey, FNP  Primary Care: Barbaraann Harvey, FNP Primary Cardiologist:  HPI: [name] is a 83 y.o. female with a PMH of pulmonary hypertension, CVA, CREST syndrome, HFpEF, atrial fibrillation who presents for initial visit for further evaluation and treatment of heart failure/cardiomyopathy.      This is a 83 year old female with a history of atrial fibrillation and CHF. She also has a history of CREST syndrome, CVA, hypertension and hyperlipidemia. 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 She has been on chronic anticoagulation with Eliquis  and bradycardia rate control with carvedilol .      SUBJECTIVE:  Patient lives at a long term care home. She is limited following her CVA, the most frustrating symptom is her inability to read or write, especially given that she was a prior clinical research associate. She is also frustrated by her inability to get around and do a lot of things on her own. She denies any significant shortness of breath, but does not exert herself much. Her legs are significantly swollen and she reports that they limit her somewhat.   PMH, current medications, allergies, social history, and family history reviewed in epic.  PHYSICAL EXAM: Vitals:   02/13/24 1141  BP: 130/72  Pulse: 94  SpO2: 92%   GENERAL: Frail, chronically ill appearing PULM:  Normal work of breathing, clear to auscultation bilaterally. Respirations are unlabored.  CARDIAC:  JVP: moderately elevated with v waves         Irregular rate and rhythm. Systolic murmur. . 2+ chronic appearing edema ABDOMEN: Soft, non-tender, non-distended.   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 Given her frailty and advanced age, as well as echocardiogram, RHC is unlikely to provide additional clinical benefit.  - Increase diuretics to lasix  40mg  BID and start spironolactone 25mg  daily - Appears moderate volume overloaded - Poor SGLT2 candidate with immobility, long term care facility - Does not appear low output, metoprolol  as above - Labs in 2 weeks - Follow up for continued diuresis, would not recommend invasive procedures at this time  HFpEF: Suspect primarily arrhythmia related, severe biatrial enlargement. Suspect the etiology for her elevated pressures as above. NYHA class difficult given post stroke assessment, hypervolemic.  - Increase lasix , start spironolactone as above  Atrial fibrillation: Permanent.  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 Should hopefully improve with diuresis. - Diuresis as above  Prior CVA: Significantly limiting with residual cognitive and physical defects  Follow up in 3 months  Palliative care consultation may be beneficial for long term care  Kathryn Brownie, MD Advanced Heart Failure Mechanical Circulatory Support 02/16/24

## 2024-02-12 NOTE — Telephone Encounter (Signed)
 Called to confirm/remind patient of their appointment at the [facility] on 02/12/2024.   Appointment:   [x] Confirmed  [] Left mess   [] No answer/No voice mail  [] VM Full/unable to leave message  [] Phone not in service  Patient reminded to bring all medications and/or complete list.  Confirmed patient has transportation. Gave directions, instructed to utilize valet parking.

## 2024-02-13 ENCOUNTER — Ambulatory Visit (HOSPITAL_COMMUNITY)
Admission: RE | Admit: 2024-02-13 | Discharge: 2024-02-13 | Disposition: A | Discharge status: Home / Self Care | Source: Ambulatory Visit | Attending: Cardiology | Admitting: Cardiology

## 2024-02-13 VITALS — BP 130/72 | HR 94 | Wt 129.0 lb

## 2024-02-13 DIAGNOSIS — I361 Nonrheumatic tricuspid (valve) insufficiency: Secondary | ICD-10-CM | POA: Insufficient documentation

## 2024-02-13 DIAGNOSIS — I4821 Permanent atrial fibrillation: Secondary | ICD-10-CM | POA: Diagnosis not present

## 2024-02-13 DIAGNOSIS — I5032 Chronic diastolic (congestive) heart failure: Secondary | ICD-10-CM | POA: Insufficient documentation

## 2024-02-13 DIAGNOSIS — I272 Pulmonary hypertension, unspecified: Secondary | ICD-10-CM | POA: Insufficient documentation

## 2024-02-13 DIAGNOSIS — E785 Hyperlipidemia, unspecified: Secondary | ICD-10-CM | POA: Insufficient documentation

## 2024-02-13 DIAGNOSIS — Z7901 Long term (current) use of anticoagulants: Secondary | ICD-10-CM | POA: Diagnosis not present

## 2024-02-13 DIAGNOSIS — M341 CR(E)ST syndrome: Secondary | ICD-10-CM | POA: Insufficient documentation

## 2024-02-13 DIAGNOSIS — I69398 Other sequelae of cerebral infarction: Secondary | ICD-10-CM | POA: Insufficient documentation

## 2024-02-13 DIAGNOSIS — Z9181 History of falling: Secondary | ICD-10-CM | POA: Diagnosis not present

## 2024-02-13 DIAGNOSIS — R600 Localized edema: Secondary | ICD-10-CM | POA: Diagnosis present

## 2024-02-13 DIAGNOSIS — I69314 Frontal lobe and executive function deficit following cerebral infarction: Secondary | ICD-10-CM | POA: Diagnosis not present

## 2024-02-13 DIAGNOSIS — I482 Chronic atrial fibrillation, unspecified: Secondary | ICD-10-CM

## 2024-02-13 MED ORDER — SPIRONOLACTONE 25 MG PO TABS: 25.0000 mg | ORAL_TABLET | Freq: Every day | ORAL | 3 refills | Status: DC

## 2024-02-13 MED ORDER — FUROSEMIDE 20 MG PO TABS: 40.0000 mg | ORAL_TABLET | Freq: Two times a day (BID) | ORAL | 3 refills | Status: DC

## 2024-02-13 NOTE — Patient Instructions (Signed)
 INCREASE Lasix  to 40 mg Twice daily  START Spironolactone 25 mg daily.  Blood work in 2-3 weeks.  Your physician recommends that you schedule a follow-up appointment in: 3 months.  If you have any questions or concerns before your next appointment please send us  a message through [place] or call our office at [phone].    TO LEAVE A MESSAGE FOR THE NURSE SELECT OPTION 2, PLEASE LEAVE A MESSAGE INCLUDING: YOUR NAME DATE OF BIRTH CALL BACK NUMBER REASON FOR CALL**this is important as we prioritize the call backs  YOU WILL RECEIVE A CALL BACK THE SAME DAY AS LONG AS YOU CALL BEFORE 4:00 PM  At the [facility], you and your health needs are our priority. As part of our continuing mission to provide you with exceptional heart care, we have created designated Provider Care Teams. These Care Teams include your primary Cardiologist (physician) and Advanced Practice Providers (APPs- Physician Assistants and Nurse Practitioners) who all work together to provide you with the care you need, when you need it.   You may see any of the following providers on your designated Care Team at your next follow up: Dr Toribio Fuel Dr Ezra Shuck Dr. Ria Commander Dr. Morene Brownie Amy Lenetta, NP Caffie Shed, [place] [facility] [place], [place] Beckey Coe, NP [place] Lee, NP Ellouise Class, NP Tinnie Redman, PharmD Jaun Bash, PharmD   Please be sure to bring in all your medications bottles to every appointment.    Thank you for choosing [facility]

## 2024-02-25 ENCOUNTER — Telehealth: Payer: Self-pay | Admitting: Podiatry

## 2024-02-25 NOTE — Telephone Encounter (Signed)
 Staff at facility states that an xray shows the patient has a broken screw in her foot. And she is having heel spurs. Patient lives in facility and uses their transportation services. Their next availability is 03/12/24 in the morning. Is it possible for you to see her then?

## 2024-03-02 NOTE — Progress Notes (Deleted)
 Chief Complaint: Dysphagia  HPI:    [name] is a an 83 year old female with a past medical history as listed below including A-fib, GERD and stroke on Eliquis  (11/21/2023 echo with LVEF 60-65%, severe biatrial enlargement, moderate pericardial effusion, mild leaky mitral valve, severe leakiness of the tricuspid valve, referred to Dr. Rolan for advanced heart failure pulmonary hypertension), who was referred to me by Barbaraann Harvey, FNP for a complaint of dysphagia.     /8/25 CT angio chest for PE showed diffusely patulous esophagus containing large volume ingested material/secretions which may predispose to aspiration.    10/25/23 CTAP with contrast for abdominal pain and emesis with small bowel obstruction possible transition point in the lower anterior abdomen, thickening of wall of the small bowel in the pelvis compatible nonspecific enteritis and marked cardiomegaly with moderate partially visualized pericardial effusion.    10/27/2023 CBC with a hemoglobin 9.5, platelets normal, BMP normal.    12/31/2023 office visit with [facility] for dysphagia.  That time had a recent CT with diffusely patulous esophagus containing large volume ingested material endorsed dysphagia taking omeprazole 20 mg daily.  Recommend an EGD.  Although patient was told due to severe pulmonary hypertension she was referred to [facility] for EGD.  Continue on Meprazole 20 mg daily.    02/13/2024 patient saw cardiology for heart failure/cardiomyopathy.  Noted to have pulmonary hypertension.  Her Lasix  and spironolactone were increased.  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   Vision abnormalities    as result of stroke   Xerophthalmia     Past Surgical History:   Procedure Laterality Date   ABDOMINAL AORTOGRAM W/LOWER EXTREMITY Left 12/14/2022   Procedure: ABDOMINAL AORTOGRAM W/LOWER EXTREMITY;  Surgeon: Magda Debby SAILOR, MD;  Location: [facility];  Service: Cardiovascular;  Laterality: Left;   CHOLECYSTECTOMY     ESOPHAGEAL DILATION N/A 12/03/2023   Procedure: DILATION, ESOPHAGUS;  Surgeon: Cinderella Deatrice FALCON, MD;  Location: [facility];  Service: Endoscopy;  Laterality: N/A;   ESOPHAGOGASTRODUODENOSCOPY N/A 12/03/2023   Procedure: EGD (ESOPHAGOGASTRODUODENOSCOPY);  Surgeon: Cinderella Deatrice FALCON, MD;  Location: [facility];  Service: Endoscopy;  Laterality: N/A;  1:30PM, ASA 3 RESIDENT AT [facility]   leg surgery     TONSILLECTOMY      Current Outpatient Medications  Medication Sig Dispense Refill   apixaban  (ELIQUIS ) 2.5 MG TABS tablet Take 1 tablet (2.5 mg total) by mouth 2 (two) times daily. 60 tablet 0   atorvastatin  (LIPITOR) 20 MG tablet Take 20 mg by mouth daily.     calcium  carbonate (TUMS - DOSED IN MG ELEMENTAL CALCIUM ) 500 MG chewable tablet Chew 1 tablet by mouth 2 (two) times daily with a meal.     Clobetasol Propionate 0.05 % shampoo Apply 1 application  topically See admin instructions. On shower day     escitalopram  (LEXAPRO ) 5 MG tablet Take 2.5 mg by mouth at bedtime.     ferrous sulfate  325 (65 FE) MG tablet Take 325 mg by mouth 3 (three) times daily with meals.     furosemide  (LASIX ) 20 MG tablet Take 2 tablets (40 mg total) by mouth 2 (two) times daily. 180 tablet  3   levothyroxine  (SYNTHROID ) 112 MCG tablet Take 112 mcg by mouth daily before breakfast.     Menthol, Topical Analgesic, (BIOFREEZE) 4 % GEL Apply 1 application topically See admin instructions. Apply to right shoulder twice a day     metoprolol  tartrate (LOPRESSOR ) 100 MG tablet Take 1 tablet (100 mg total) by mouth 2 (two) times daily. 180 tablet 3   mirtazapine  (REMERON ) 15 MG tablet Take 15 mg by mouth at bedtime.     omeprazole (PRILOSEC) 20 MG capsule  Take 20 mg by mouth daily.     OXYGEN Inhale 2 L into the lungs 2 (two) times daily as needed (Shortness of breath and when oxygen levels drops below 92%).     polyethylene glycol (MIRALAX  / GLYCOLAX ) 17 g packet Take 17 g by mouth daily as needed for mild constipation or moderate constipation. 14 each 0   Probiotic Product (ACIDOPHILUS PROBIOTIC BLEND) CAPS Take 1 capsule by mouth daily.     promethazine (PHENERGAN) 12.5 MG tablet Take 12.5 mg by mouth daily as needed for nausea or vomiting.     Propylene Glycol (SYSTANE BALANCE OP) Place 1 drop into both eyes 2 (two) times daily.     Salicylic Acid 50 % SOLN by Does not apply route.     spironolactone (ALDACTONE) 25 MG tablet Take 1 tablet (25 mg total) by mouth daily. 90 tablet 3   No current facility-administered medications for this visit.    Allergies as of 03/04/2024 - Review Complete 02/13/2024  Allergen Reaction Noted   Sulfa antibiotics Other (See Comments) 01/01/2017    Family History  Family history unknown: Yes    Social History   Socioeconomic History   Marital status: Legally Separated    Spouse name: Not on file   Number of children: 2   Years of education: 16   Highest education level: Not on file  Occupational History    Comment: retired  Tobacco Use   Smoking status: Former   Smokeless tobacco: Never  Vaping Use   Vaping status: Never Used  Substance and Sexual Activity   Alcohol use: No   Drug use: No   Sexual activity: Not on file  Other Topics Concern   Not on file  Social History Narrative   01/22/17 lives at [facility]   Social Drivers of Health   Financial Resource Strain: Not on file  Food Insecurity: No Food Insecurity (10/25/2023)   Hunger Vital Sign    Worried About Running Out of Food in the Last Year: Never true    Ran Out of Food in the Last Year: Never true  Transportation Needs: No Transportation Needs (10/25/2023)   PRAPARE - Administrator, Civil Service (Medical): No     Lack of Transportation (Non-Medical): No  Physical Activity: Not on file  Stress: Not on file  Social Connections: Patient Declined (10/25/2023)   Social Connection and Isolation Panel    Frequency of Communication with Friends and Family: Patient declined    Frequency of Social Gatherings with Friends and Family: Patient declined    Attends Religious Services: Patient declined    Database Administrator or Organizations: Patient declined    Attends Banker Meetings: Patient declined    Marital Status: Patient declined  Intimate Partner Violence: Not At Risk (10/25/2023)   Humiliation, Afraid, Rape, and Kick questionnaire    Fear of Current or Ex-Partner: No    Emotionally Abused: No  Physically Abused: No    Sexually Abused: No    Review of Systems:    Constitutional: No weight loss, fever, chills, weakness or fatigue HEENT: Eyes: No change in vision               Ears, Nose, Throat:  No change in hearing or congestion Skin: No rash or itching Cardiovascular: No chest pain, chest pressure or palpitations   Respiratory: No SOB or cough Gastrointestinal: See HPI and otherwise negative Genitourinary: No dysuria or change in urinary frequency Neurological: No headache, dizziness or syncope Musculoskeletal: No new muscle or joint pain Hematologic: No bleeding or bruising Psychiatric: No history of depression or anxiety    Physical Exam:  Vital signs: There were no vitals taken for this visit.  Constitutional:   Pleasant Caucasian female appears to be in NAD, Well developed, Well nourished, alert and cooperative Head:  Normocephalic and atraumatic. Eyes:   PEERL, EOMI. No icterus. Conjunctiva pink. Ears:  Normal auditory acuity. Neck:  Supple Throat: Oral cavity and pharynx without inflammation, swelling or lesion.  Respiratory: Respirations even and unlabored. Lungs clear to auscultation bilaterally.   No wheezes, crackles, or rhonchi.  Cardiovascular: Normal  S1, S2. No MRG. Regular rate and rhythm. No peripheral edema, cyanosis or pallor.  Gastrointestinal:  Soft, nondistended, nontender. No rebound or guarding. Normal bowel sounds. No appreciable masses or hepatomegaly. Rectal:  Not performed.  Msk:  Symmetrical without gross deformities. Without edema, no deformity or joint abnormality.  Neurologic:  Alert and  oriented x4;  grossly normal neurologically.  Skin:   Dry and intact without significant lesions or rashes. Psychiatric: Oriented to person, place and time. Demonstrates good judgement and reason without abnormal affect or behaviors.  RELEVANT LABS AND IMAGING: CBC    Component Value Date/Time   WBC 6.9 10/27/2023 0309   RBC 3.79 (L) 10/27/2023 0309   HGB 9.5 (L) 10/27/2023 0309   HCT 32.6 (L) 10/27/2023 0309   PLT 221 10/27/2023 0309   MCV 86.0 10/27/2023 0309   MCH 25.1 (L) 10/27/2023 0309   MCHC 29.1 (L) 10/27/2023 0309   RDW 17.5 (H) 10/27/2023 0309   LYMPHSABS 1.4 10/24/2023 2338   MONOABS 0.8 10/24/2023 2338   EOSABS 0.0 10/24/2023 2338   BASOSABS 0.0 10/24/2023 2338    CMP     Component Value Date/Time   NA 139 10/27/2023 0309   K 3.9 10/27/2023 0309   CL 105 10/27/2023 0309   CO2 25 10/27/2023 0309   GLUCOSE 84 10/27/2023 0309   BUN 19 10/27/2023 0309   CREATININE 0.94 10/27/2023 0309   CALCIUM  8.3 (L) 10/27/2023 0309   PROT 7.4 10/24/2023 2338   ALBUMIN 3.5 10/24/2023 2338   AST 17 10/24/2023 2338   ALT 11 10/24/2023 2338   ALKPHOS 109 10/24/2023 2338   BILITOT 1.0 10/24/2023 2338   GFRNONAA >60 10/27/2023 0309   GFRAA >60 12/09/2019 1301    Assessment: 1.  Dysphagia: 2.  Abnormal CT of the chest: Showing patulous esophagus with large volume ingested material/secretions 3.  Pulmonary hypertension: 4.  A-fib on chronic anticoagulation with Eliquis   Plan: 1. ***     Kathryn Failing, PA-C [facility] 03/02/2024, 3:05 PM  Cc: Barbaraann Harvey, FNP

## 2024-03-04 ENCOUNTER — Ambulatory Visit: Admitting: Physician Assistant

## 2024-03-12 ENCOUNTER — Ambulatory Visit (INDEPENDENT_AMBULATORY_CARE_PROVIDER_SITE_OTHER): Admitting: Podiatry

## 2024-03-12 ENCOUNTER — Encounter: Payer: Self-pay | Admitting: Podiatry

## 2024-03-12 ENCOUNTER — Ambulatory Visit (INDEPENDENT_AMBULATORY_CARE_PROVIDER_SITE_OTHER)

## 2024-03-12 DIAGNOSIS — M722 Plantar fascial fibromatosis: Secondary | ICD-10-CM | POA: Diagnosis not present

## 2024-03-12 DIAGNOSIS — T849XXA Unspecified complication of internal orthopedic prosthetic device, implant and graft, initial encounter: Secondary | ICD-10-CM | POA: Diagnosis not present

## 2024-03-12 MED ORDER — TRIAMCINOLONE ACETONIDE 10 MG/ML IJ SUSP
10.0000 mg | Freq: Once | INTRAMUSCULAR | Status: AC
  Administered 2024-03-12: 10 mg via INTRA_ARTICULAR

## 2024-03-13 NOTE — Progress Notes (Signed)
 Subjective:   Patient ID: [name], female   DOB: 83 y.o.   MRN: [id number]   HPI Patient presents with caregiver with pain in the bottom of her arch and possible history of broken screw in her foot with history of a fusion years ago   ROS      Objective:  Physical Exam  Neurovascular status does appear to be unchanged with patient having disease but not most likely fixable with inflammation of the plantar arch right with fluid buildup     Assessment:  Appears to be a fascial inflammation right cannot rule out screw involvement     Plan:  H&P x-ray taken sterile prep injected the fascia mid arch right 3 mg dexamethasone Kenalog  5 mg Xylocaine  and applied sterile dressing.  I then reviewed her x-ray indicating screw is broken but not in the area of her pain so I think that is probably been there for a long time and should not be anything of concern long-term

## 2024-04-06 ENCOUNTER — Other Ambulatory Visit: Payer: Self-pay

## 2024-04-06 ENCOUNTER — Ambulatory Visit: Admitting: Podiatry

## 2024-04-06 ENCOUNTER — Encounter (HOSPITAL_COMMUNITY): Payer: Self-pay

## 2024-04-06 ENCOUNTER — Emergency Department (HOSPITAL_COMMUNITY)
Admission: EM | Admit: 2024-04-06 | Discharge: 2024-04-06 | Disposition: A | Discharge status: Skilled Nursing Facility | Source: Skilled Nursing Facility | Attending: Emergency Medicine | Admitting: Emergency Medicine

## 2024-04-06 ENCOUNTER — Emergency Department (HOSPITAL_COMMUNITY)

## 2024-04-06 DIAGNOSIS — Z79890 Hormone replacement therapy (postmenopausal): Secondary | ICD-10-CM | POA: Diagnosis not present

## 2024-04-06 DIAGNOSIS — M341 CR(E)ST syndrome: Secondary | ICD-10-CM | POA: Diagnosis not present

## 2024-04-06 DIAGNOSIS — I7300 Raynaud's syndrome without gangrene: Secondary | ICD-10-CM

## 2024-04-06 DIAGNOSIS — Z79899 Other long term (current) drug therapy: Secondary | ICD-10-CM | POA: Diagnosis not present

## 2024-04-06 DIAGNOSIS — E785 Hyperlipidemia, unspecified: Secondary | ICD-10-CM | POA: Diagnosis not present

## 2024-04-06 DIAGNOSIS — E039 Hypothyroidism, unspecified: Secondary | ICD-10-CM | POA: Diagnosis not present

## 2024-04-06 DIAGNOSIS — I4891 Unspecified atrial fibrillation: Secondary | ICD-10-CM | POA: Diagnosis not present

## 2024-04-06 DIAGNOSIS — D649 Anemia, unspecified: Secondary | ICD-10-CM | POA: Diagnosis not present

## 2024-04-06 DIAGNOSIS — I10 Essential (primary) hypertension: Secondary | ICD-10-CM | POA: Diagnosis not present

## 2024-04-06 DIAGNOSIS — K219 Gastro-esophageal reflux disease without esophagitis: Secondary | ICD-10-CM | POA: Insufficient documentation

## 2024-04-06 DIAGNOSIS — R0602 Shortness of breath: Secondary | ICD-10-CM | POA: Diagnosis present

## 2024-04-06 DIAGNOSIS — Z8673 Personal history of transient ischemic attack (TIA), and cerebral infarction without residual deficits: Secondary | ICD-10-CM | POA: Insufficient documentation

## 2024-04-06 DIAGNOSIS — Z89022 Acquired absence of left finger(s): Secondary | ICD-10-CM | POA: Insufficient documentation

## 2024-04-06 DIAGNOSIS — Z7901 Long term (current) use of anticoagulants: Secondary | ICD-10-CM | POA: Diagnosis not present

## 2024-04-06 DIAGNOSIS — R0902 Hypoxemia: Secondary | ICD-10-CM | POA: Diagnosis present

## 2024-04-06 LAB — CBC WITH DIFFERENTIAL/PLATELET
Abs Immature Granulocytes: 0.03 K/uL (ref 0.00–0.07)
Basophils Absolute: 0 K/uL (ref 0.0–0.1)
Basophils Relative: 0 %
Eosinophils Absolute: 0.1 K/uL (ref 0.0–0.5)
Eosinophils Relative: 1 %
HCT: 39.9 % (ref 36.0–46.0)
Hemoglobin: 12.2 g/dL (ref 12.0–15.0)
Immature Granulocytes: 0 %
Lymphocytes Relative: 19 %
Lymphs Abs: 1.6 K/uL (ref 0.7–4.0)
MCH: 26.8 pg (ref 26.0–34.0)
MCHC: 30.6 g/dL (ref 30.0–36.0)
MCV: 87.7 fL (ref 80.0–100.0)
Monocytes Absolute: 0.6 K/uL (ref 0.1–1.0)
Monocytes Relative: 8 %
Neutro Abs: 5.7 K/uL (ref 1.7–7.7)
Neutrophils Relative %: 72 %
Platelets: 243 K/uL (ref 150–400)
RBC: 4.55 MIL/uL (ref 3.87–5.11)
RDW: 18.9 % — ABNORMAL HIGH (ref 11.5–15.5)
WBC: 8 K/uL (ref 4.0–10.5)
nRBC: 0 % (ref 0.0–0.2)

## 2024-04-06 LAB — URINALYSIS, ROUTINE W REFLEX MICROSCOPIC
Bilirubin Urine: NEGATIVE
Glucose, UA: NEGATIVE mg/dL
Hgb urine dipstick: NEGATIVE
Ketones, ur: NEGATIVE mg/dL
Leukocytes,Ua: NEGATIVE
Nitrite: NEGATIVE
Protein, ur: NEGATIVE mg/dL
Specific Gravity, Urine: 1.005 (ref 1.005–1.030)
pH: 6 (ref 5.0–8.0)

## 2024-04-06 LAB — RESP PANEL BY RT-PCR (RSV, FLU A&B, COVID)  RVPGX2
Influenza A by PCR: NEGATIVE
Influenza B by PCR: NEGATIVE
Resp Syncytial Virus by PCR: NEGATIVE
SARS Coronavirus 2 by RT PCR: NEGATIVE

## 2024-04-06 LAB — BASIC METABOLIC PANEL WITH GFR
Anion gap: 15 (ref 5–15)
BUN: 52 mg/dL — ABNORMAL HIGH (ref 8–23)
CO2: 27 mmol/L (ref 22–32)
Calcium: 9 mg/dL (ref 8.9–10.3)
Chloride: 98 mmol/L (ref 98–111)
Creatinine, Ser: 1.28 mg/dL — ABNORMAL HIGH (ref 0.44–1.00)
GFR, Estimated: 42 mL/min — ABNORMAL LOW (ref >60)
Glucose, Bld: 119 mg/dL — ABNORMAL HIGH (ref 70–99)
Potassium: 4 mmol/L (ref 3.5–5.1)
Sodium: 139 mmol/L (ref 135–145)

## 2024-04-06 LAB — PRO BRAIN NATRIURETIC PEPTIDE: Pro Brain Natriuretic Peptide: 2334 pg/mL — ABNORMAL HIGH (ref <300.0)

## 2024-04-06 NOTE — ED Provider Notes (Signed)
° ° °  ED Course / MDM   Clinical Course as of 04/06/24 1659  Mon Apr 06, 2024  1535 Received sign out  from Dr. Dean pending reassessment, labs. Sent for low spo2 on finger but pt has reynauds. Patient has no complaints. [WS]  1655 Workup with  [WS]  1657 Workup reassuring, BNP elevated but clinically patient appears euvolemic, low concern for acute CHF or pHTN exacerbation. Pt reports she feels at her baseline and denies any shortness of breath or other symptoms. Will discharge patient to home. All questions answered. Patient comfortable with plan of discharge. Return precautions discussed with patient and specified on the after visit summary.  [WS]    Clinical Course User Index [WS] Francesca Elsie CROME, MD   Medical Decision Making Amount and/or Complexity of Data Reviewed Labs: ordered. Radiology: ordered.         Francesca Elsie CROME, MD 04/06/24 [phone]

## 2024-04-06 NOTE — ED Triage Notes (Signed)
 Pt arrived via [facility] from [facility] for evaluation of possible hypoxia. Per EMS the facility reported Pts O2 Sats were 77% on room air and the Pt has 3L Nasal Cannula Oxygen as needed. Per EMS, the Pts finger were cold upon arrival, and report applying heat pack and checking and report the Pts O2 Sats for them were 98% room air. Pt is HOH and does report increased weakness.

## 2024-04-06 NOTE — ED Notes (Signed)
 ED Provider at bedside.

## 2024-04-06 NOTE — ED Notes (Signed)
 Tried multiple times to call for report at facility, unable to leave message due voicemail is full. CN made aware. Secretary has tried multiple times as well with no answer.

## 2024-04-06 NOTE — Discharge Instructions (Signed)
 It is very important to keep pt's hands warm.  Do not check a pulse ox with cold fingers.  It won't be accurate.

## 2024-04-06 NOTE — ED Provider Notes (Signed)
 [place] EMERGENCY DEPARTMENT AT [facility] Provider Note   CSN: [id number] Arrival date & time: 04/06/24  1357     Patient presents with: hypoxia   [name] is a 83 y.o. female.   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  Pt lives at a SNF and they checked her O2 sat and got 77% on RA.  EMS warmed up her fingers and O2 sat was up to 98% on RA.  The facility still wanted her checked out.  Pt has no complaints.       Prior to Admission medications  Medication Sig Start Date End Date Taking? Authorizing Provider  apixaban  (ELIQUIS ) 2.5 MG TABS tablet Take 1 tablet (2.5 mg total) by mouth 2 (two) times daily. 07/03/22   Regalado, Belkys A, MD  atorvastatin  (LIPITOR) 20 MG tablet Take 20 mg by mouth daily.    [provider]  calcium  carbonate (TUMS - DOSED IN MG ELEMENTAL CALCIUM ) 500 MG chewable tablet Chew 1 tablet by mouth 2 (two) times daily with a meal.    [provider]  Clobetasol Propionate 0.05 % shampoo Apply 1 application  topically See admin instructions. On shower day 11/06/22   [provider]  escitalopram  (LEXAPRO ) 5 MG tablet Take 2.5 mg by mouth at bedtime.    [provider]  ferrous sulfate  325 (65 FE) MG tablet Take 325 mg by mouth 3 (three) times daily with meals.    [provider]  furosemide  (LASIX ) 20 MG tablet Take 2 tablets (40 mg total) by mouth 2 (two) times daily. 02/13/24   Zenaida Morene PARAS, MD  levothyroxine  (SYNTHROID ) 112 MCG tablet Take 112 mcg by mouth daily before breakfast.    [provider]  Menthol, Topical Analgesic, (BIOFREEZE) 4 % GEL Apply 1 application topically See admin instructions. Apply to right shoulder twice a day    [provider]  metoprolol  tartrate (LOPRESSOR ) 100 MG tablet Take 1 tablet (100 mg total) by mouth 2 (two) times  daily. 11/21/23 03/12/24  Shlomo Wilbert SAUNDERS, MD  mirtazapine  (REMERON ) 15 MG tablet Take 15 mg by mouth at bedtime.    [provider]  omeprazole (PRILOSEC) 20 MG capsule Take 20 mg by mouth daily.    [provider]  OXYGEN Inhale 2 L into the lungs 2 (two) times daily as needed (Shortness of breath and when oxygen levels drops below 92%).    [provider]  polyethylene glycol (MIRALAX  / GLYCOLAX ) 17 g packet Take 17 g by mouth daily as needed for mild constipation or moderate constipation. 11/16/21   Pappayliou, Dorothyann A, DO  Probiotic Product (ACIDOPHILUS PROBIOTIC BLEND) CAPS Take 1 capsule by mouth daily. 09/17/22   [provider]  promethazine (PHENERGAN) 12.5 MG tablet Take 12.5 mg by mouth daily as needed for nausea or vomiting.    [provider]  Propylene Glycol (SYSTANE BALANCE OP) Place 1 drop into both eyes 2 (two) times daily.    [provider]  Salicylic Acid 50 % SOLN by Does not apply route.    [provider]  spironolactone  (ALDACTONE ) 25 MG tablet Take 1 tablet (25 mg total) by mouth daily. 02/13/24 05/13/24  Zenaida Morene PARAS, MD    Allergies: Sulfa antibiotics    Review of Systems  All other systems reviewed and are negative.   Updated Vital  Signs BP 110/76   Pulse 78   Temp 97.8 F (36.6 C) (Oral)   Resp 13   Ht 5' 6 (1.676 m)   Wt 58.5 kg   SpO2 94%   BMI 20.82 kg/m   Physical Exam Vitals and nursing note reviewed.  Constitutional:      Appearance: Normal appearance.  HENT:     Head: Normocephalic and atraumatic.     Right Ear: External ear normal.     Left Ear: External ear normal.     Nose: Nose normal.     Mouth/Throat:     Mouth: Mucous membranes are moist.     Pharynx: Oropharynx is clear.  Eyes:     Extraocular Movements: Extraocular movements intact.     Conjunctiva/sclera: Conjunctivae normal.     Pupils: Pupils are equal, round, and reactive to light.  Cardiovascular:      Rate and Rhythm: Normal rate and regular rhythm.     Pulses: Normal pulses.     Heart sounds: Normal heart sounds.  Pulmonary:     Effort: Pulmonary effort is normal.     Breath sounds: Normal breath sounds.  Abdominal:     General: Abdomen is flat. Bowel sounds are normal.     Palpations: Abdomen is soft.  Musculoskeletal:     Cervical back: Normal range of motion and neck supple.  Skin:    General: Skin is warm.     Capillary Refill: Capillary refill takes less than 2 seconds.     Comments: Raynaud's phenomenon to both hands  Neurological:     General: No focal deficit present.     Mental Status: She is alert and oriented to person, place, and time.  Psychiatric:        Mood and Affect: Mood normal.        Behavior: Behavior normal.     (all labs ordered are listed, but only abnormal results are displayed) Labs Reviewed  BASIC METABOLIC PANEL WITH GFR - Abnormal; Notable for the following components:      Result Value   Glucose, Bld 119 (*)    BUN 52 (*)    Creatinine, Ser 1.28 (*)    GFR, Estimated 42 (*)    All other components within normal limits  PRO BRAIN NATRIURETIC PEPTIDE - Abnormal; Notable for the following components:   Pro Brain Natriuretic Peptide 2,334.0 (*)    All other components within normal limits  CBC WITH DIFFERENTIAL/PLATELET - Abnormal; Notable for the following components:   RDW 18.9 (*)    All other components within normal limits  RESP PANEL BY RT-PCR (RSV, FLU A&B, COVID)  [id number]  URINALYSIS, ROUTINE W REFLEX MICROSCOPIC    EKG: EKG Interpretation Date/Time:  Monday April 06 2024 14:12:50 EST Ventricular Rate:  82 PR Interval:  194 QRS Duration:  88 QT Interval:  369 QTC Calculation: 431 R Axis:   88  Text Interpretation: Atrial fibrillation Borderline right axis deviation Borderline ST depression, diffuse leads Since last tracing rate slower Confirmed by Dean Clarity [phone]) on 04/06/2024 3:11:23 PM  Radiology: [facility] Chest  Port 1 View Result Date: 04/06/2024 CLINICAL DATA:  Shortness of breath EXAM: PORTABLE CHEST 1 VIEW COMPARISON:  Chest radiograph dated 07/30/2023. FINDINGS: No focal consolidation, pleural effusion or pneumothorax. Stable cardiomegaly. Atherosclerotic calcification of the aorta. No acute osseous pathology. IMPRESSION: 1. No active disease. 2. Cardiomegaly. Electronically Signed   By: Vanetta Chou M.D.   On: 04/06/2024 14:41     Procedures  Medications Ordered in the ED - No data to display  Clinical Course as of 04/06/24 1540  Mon Apr 06, 2024  1535 Received sign out  from Dr. Dean pending reassessment, labs. Sent for low spo2 on finger but pt has reynauds. Patient has no complaints. [WS]    Clinical Course User Index [WS] Francesca Elsie CROME, MD                                 Medical Decision Making Amount and/or Complexity of Data Reviewed Labs: ordered. Radiology: ordered.   This patient presents to the ED for concern of hypoxia, this involves an extensive number of treatment options, and is a complaint that carries with it a high risk of complications and morbidity.  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  The pertinent results include:  cbc nl, bmp with bun 52 and cr 1.28 (nl on 7/25); bnp sl elevated at 2334   Imaging Studies ordered:  I ordered imaging studies including cxr  I independently visualized and interpreted imaging which showed  No active disease.  2. Cardiomegaly.   I agree with the radiologist interpretation   Cardiac  Monitoring:  The patient was maintained on a cardiac monitor.  I personally viewed and interpreted the cardiac monitored which showed an underlying rhythm of: nsr   Medicines ordered and prescription drug management:  I have reviewed the patients home medicines and have made adjustments as needed  Problem List / ED Course:  Raynaud's phenomenon:  pt's true O2 sat is normal.  Facility is encouraged to warm up pt's hands prior to calling EMS for hypoxia.     Reevaluation:  After the interventions noted above, I reevaluated the patient and found that they have :improved   Social Determinants of Health:  Lives in SNF   Dispostion:  After consideration of the diagnostic results and the patients response to treatment, I feel that the patent would benefit from discharge with outpatient f/u.       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

## 2024-04-06 NOTE — ED Notes (Signed)
 Assisted pt on bedpan.

## 2024-04-28 ENCOUNTER — Encounter (INDEPENDENT_AMBULATORY_CARE_PROVIDER_SITE_OTHER): Payer: Self-pay | Admitting: Gastroenterology

## 2024-04-28 ENCOUNTER — Ambulatory Visit (INDEPENDENT_AMBULATORY_CARE_PROVIDER_SITE_OTHER): Admitting: Gastroenterology

## 2024-05-04 ENCOUNTER — Encounter: Payer: Self-pay | Admitting: Podiatry

## 2024-05-04 ENCOUNTER — Ambulatory Visit: Admitting: Podiatry

## 2024-05-04 DIAGNOSIS — I999 Unspecified disorder of circulatory system: Secondary | ICD-10-CM

## 2024-05-04 DIAGNOSIS — M722 Plantar fascial fibromatosis: Secondary | ICD-10-CM

## 2024-05-06 NOTE — Progress Notes (Signed)
 Subjective:   Patient ID: [name], female   DOB: 84 y.o.   MRN: [id number]   HPI Patient presents with caregiver with pain in both feet and difficulty articulating exactly what is bothering her.  There is some slight breakdown of tissue plantar left around the fifth metatarsal base localized no erythema edema drainage   ROS      Objective:  Physical Exam  The patient who has poor circulatory status poor overall generalized health and not a good communicator with chronic foot pain that is present with lesion subfifth metatarsal base left being treated at the rehab center she is at.  It is localized I did not note any deep penetration or proximal edema erythema     Assessment:  Patient with numerous problems with vascular disease which may be part of her pain and they have already tried to revascularize her along with a superficial tissue irritation     Plan:  H&P reviewed and at this point I discussed and I debrided tissue I did not note any breakdown of tissue it appears to be localized and I advised for them to continue on the same treatment and I do not see treatment for the vascular disease and any other ways that we can improve her condition and I discussed this with her caregiver educating them on this

## 2024-05-08 NOTE — Progress Notes (Incomplete)
" ° °  ADVANCED HEART FAILURE NEW PATIENT CLINIC NOTE  Referring Physician: Barbaraann Harvey, FNP  Primary Care: Barbaraann Harvey, FNP Primary Cardiologist:  HPI: [name] is a 84 y.o. female with a PMH of pulmonary hypertension, CVA, CREST syndrome, HFpEF, atrial fibrillation who presents for initial visit for further evaluation and treatment of heart failure/cardiomyopathy.      This is a 84 year old female with a history of atrial fibrillation and CHF. She also has a history of CREST syndrome, CVA, hypertension and hyperlipidemia. 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 She has been on chronic anticoagulation with Eliquis  and bradycardia rate control with carvedilol .      SUBJECTIVE:  Patient lives at a long term care home. She is limited following her CVA, the most frustrating symptom is her inability to read or write, especially given that she was a prior clinical research associate. She is also frustrated by her inability to get around and do a lot of things on her own. She denies any significant shortness of breath, but does not exert herself much. Her legs are significantly swollen and she reports that they limit her somewhat.   PMH, current medications, allergies, social history, and family history reviewed in epic.  PHYSICAL EXAM: There were no vitals filed for this visit.  GENERAL: Frail, chronically ill appearing PULM:  Normal work of breathing, clear to auscultation bilaterally. Respirations are unlabored.  CARDIAC:  JVP: moderately elevated with v waves         Irregular rate and rhythm. Systolic murmur. . 2+ chronic appearing edema ABDOMEN: Soft, non-tender, non-distended.   DATA REVIEW  ECG: 11/21/2023: Afib with RVR, normal QRS    ECHO: 12/10/2023: LVEF 60-65%, severe TR, mild MR, severe biatrial dilation  CATH: None   ASSESSMENT & PLAN:  Pulmonary hypertension: Suspect primarily group II given echocardiogram  showing permanent afib with extensive atrial remodeling. Given her frailty and advanced age, as well as echocardiogram, RHC is unlikely to provide additional clinical benefit.  - Increase diuretics to lasix  40mg  BID and start spironolactone  25mg  daily - Appears moderate volume overloaded - Poor SGLT2 candidate with immobility, long term care facility - Does not appear low output, metoprolol  as above - Labs in 2 weeks - Follow up for continued diuresis, would not recommend invasive procedures at this time  HFpEF: Suspect primarily arrhythmia related, severe biatrial enlargement. Suspect the etiology for her elevated pressures as above. NYHA class difficult given post stroke assessment, hypervolemic.  - Increase lasix , start spironolactone  as above  Atrial fibrillation: Permanent.  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 Should hopefully improve with diuresis. - Diuresis as above  Prior CVA: Significantly limiting with residual cognitive and physical defects  Follow up in 3 months  Palliative care consultation may be beneficial for long term care  Morene Brownie, MD Advanced Heart Failure Mechanical Circulatory Support 05/08/24 "

## 2024-05-11 ENCOUNTER — Telehealth (HOSPITAL_COMMUNITY): Payer: Self-pay | Admitting: *Deleted

## 2024-05-11 NOTE — Telephone Encounter (Signed)
 Called to confirm/remind patient of their appointment at the [facility] on  05/12/24. Unable to reach anyone at [facility] and unable to leave message (VM full). Called and spoke with patient's son:   Appointment:              [x] Confirmed             [] Left mess              [] No answer/No voice mail             [] Phone not in service   Patient reminded to bring all medications and/or complete list.   Confirmed patient has transportation. Gave directions, instructed to utilize valet parking.

## 2024-05-12 ENCOUNTER — Encounter (HOSPITAL_COMMUNITY): Payer: Self-pay

## 2024-05-12 ENCOUNTER — Ambulatory Visit (HOSPITAL_COMMUNITY)
Admission: RE | Admit: 2024-05-12 | Discharge: 2024-05-12 | Disposition: A | Discharge status: Home / Self Care | Source: Ambulatory Visit | Attending: Family Medicine | Admitting: Family Medicine

## 2024-05-12 VITALS — BP 112/64 | HR 103 | Ht 66.0 in | Wt 127.8 lb

## 2024-05-12 DIAGNOSIS — E785 Hyperlipidemia, unspecified: Secondary | ICD-10-CM | POA: Insufficient documentation

## 2024-05-12 DIAGNOSIS — M341 CR(E)ST syndrome: Secondary | ICD-10-CM | POA: Insufficient documentation

## 2024-05-12 DIAGNOSIS — I272 Pulmonary hypertension, unspecified: Secondary | ICD-10-CM | POA: Diagnosis not present

## 2024-05-12 DIAGNOSIS — I482 Chronic atrial fibrillation, unspecified: Secondary | ICD-10-CM

## 2024-05-12 DIAGNOSIS — Z79899 Other long term (current) drug therapy: Secondary | ICD-10-CM | POA: Diagnosis not present

## 2024-05-12 DIAGNOSIS — I110 Hypertensive heart disease with heart failure: Secondary | ICD-10-CM | POA: Diagnosis not present

## 2024-05-12 DIAGNOSIS — I4821 Permanent atrial fibrillation: Secondary | ICD-10-CM | POA: Diagnosis not present

## 2024-05-12 DIAGNOSIS — I5032 Chronic diastolic (congestive) heart failure: Secondary | ICD-10-CM | POA: Insufficient documentation

## 2024-05-12 DIAGNOSIS — Z8673 Personal history of transient ischemic attack (TIA), and cerebral infarction without residual deficits: Secondary | ICD-10-CM | POA: Diagnosis not present

## 2024-05-12 DIAGNOSIS — I071 Rheumatic tricuspid insufficiency: Secondary | ICD-10-CM | POA: Insufficient documentation

## 2024-05-12 DIAGNOSIS — E8770 Fluid overload, unspecified: Secondary | ICD-10-CM | POA: Diagnosis not present

## 2024-05-12 DIAGNOSIS — Z7901 Long term (current) use of anticoagulants: Secondary | ICD-10-CM | POA: Insufficient documentation

## 2024-05-12 MED ORDER — FUROSEMIDE 20 MG PO TABS: 60.0000 mg | ORAL_TABLET | Freq: Two times a day (BID) | ORAL | Status: DC

## 2024-05-12 MED ORDER — POTASSIUM CHLORIDE CRYS ER 20 MEQ PO TBCR: 20.0000 meq | EXTENDED_RELEASE_TABLET | Freq: Every day | ORAL | Status: DC

## 2024-05-12 NOTE — Addendum Note (Signed)
 Encounter addended by: Micael Sueanne LABOR, CMA on: 05/12/2024 4:34 PM  Actions taken: Order list changed, Diagnosis association updated

## 2024-05-12 NOTE — Patient Instructions (Signed)
 Medication Changes:  INCREASE FUROSEMIDE  60MG  TWICE DAILY   START POTASSIUM 20MEQ ONCE DAILY   Lab Work:  Labs done today, your results will be available in MyChart, we will contact you for abnormal readings.  AND THEN LABS AGAIN IN 2 WEEKS AT FACILITY (BASIC METABOLIC PANEL)  Special Instructions // Education:  PLEASE PLACE UNNA BOOTS PER FACILITY   Follow-Up in: 3-4 MONTHS WITH DR. ZENAIDA AS SCHEDULED   At the [facility], you and your health needs are our priority. We have a designated team specialized in the treatment of Heart Failure. This Care Team includes your primary Heart Failure Specialized Cardiologist (physician), Advanced Practice Providers (APPs- Physician Assistants and Nurse Practitioners), and Pharmacist who all work together to provide you with the care you need, when you need it.   You may see any of the following providers on your designated Care Team at your next follow up:  Dr. Toribio Fuel Dr. Ezra Shuck Dr. Odis Zenaida Greig Mosses, NP Caffie Shed, [place] [facility] [place], [place] Beckey Coe, NP Jordan Lee, NP Tinnie Redman, PharmD   Please be sure to bring in all your medications bottles to every appointment.   Need to Contact Us :  If you have any questions or concerns before your next appointment please send us  a message through [place] or call our office at [phone].    TO LEAVE A MESSAGE FOR THE NURSE SELECT OPTION 2, PLEASE LEAVE A MESSAGE INCLUDING: YOUR NAME DATE OF BIRTH CALL BACK NUMBER REASON FOR CALL**this is important as we prioritize the call backs  YOU WILL RECEIVE A CALL BACK THE SAME DAY AS LONG AS YOU CALL BEFORE 4:00 PM

## 2024-05-12 NOTE — Addendum Note (Signed)
 Encounter addended by: Glena Harlene HERO, FNP on: 05/12/2024 4:21 PM  Actions taken: Clinical Note Signed

## 2024-05-13 ENCOUNTER — Other Ambulatory Visit (HOSPITAL_COMMUNITY): Payer: Self-pay | Admitting: Cardiology

## 2024-05-13 MED ORDER — POTASSIUM CHLORIDE CRYS ER 20 MEQ PO TBCR: 20.0000 meq | EXTENDED_RELEASE_TABLET | Freq: Every day | ORAL | 2 refills | Status: AC

## 2024-05-13 MED ORDER — FUROSEMIDE 20 MG PO TABS: 60.0000 mg | ORAL_TABLET | Freq: Two times a day (BID) | ORAL | 2 refills | Status: DC

## 2024-05-13 NOTE — Telephone Encounter (Signed)
 [facility] called to request rx for med changes  Please send RX for lasix  and KCL to [facility]    Med sent

## 2024-05-13 NOTE — Addendum Note (Signed)
 Encounter addended by: Micael Sueanne LABOR, CMA on: 05/13/2024 8:30 AM  Actions taken: Child order released for a procedure order

## 2024-05-18 ENCOUNTER — Emergency Department (HOSPITAL_COMMUNITY)

## 2024-05-18 ENCOUNTER — Observation Stay (HOSPITAL_COMMUNITY)
Admission: EM | Admit: 2024-05-18 | Source: Skilled Nursing Facility | Attending: Internal Medicine | Admitting: Internal Medicine

## 2024-05-18 ENCOUNTER — Other Ambulatory Visit: Payer: Self-pay

## 2024-05-18 ENCOUNTER — Encounter (HOSPITAL_COMMUNITY): Payer: Self-pay

## 2024-05-18 DIAGNOSIS — B955 Unspecified streptococcus as the cause of diseases classified elsewhere: Secondary | ICD-10-CM | POA: Diagnosis present

## 2024-05-18 DIAGNOSIS — E782 Mixed hyperlipidemia: Secondary | ICD-10-CM | POA: Diagnosis present

## 2024-05-18 DIAGNOSIS — I110 Hypertensive heart disease with heart failure: Secondary | ICD-10-CM | POA: Diagnosis present

## 2024-05-18 DIAGNOSIS — I5032 Chronic diastolic (congestive) heart failure: Secondary | ICD-10-CM | POA: Diagnosis present

## 2024-05-18 DIAGNOSIS — I639 Cerebral infarction, unspecified: Secondary | ICD-10-CM | POA: Diagnosis not present

## 2024-05-18 DIAGNOSIS — E871 Hypo-osmolality and hyponatremia: Secondary | ICD-10-CM | POA: Diagnosis present

## 2024-05-18 DIAGNOSIS — E785 Hyperlipidemia, unspecified: Secondary | ICD-10-CM | POA: Diagnosis present

## 2024-05-18 DIAGNOSIS — Z6820 Body mass index (BMI) 20.0-20.9, adult: Secondary | ICD-10-CM

## 2024-05-18 DIAGNOSIS — M79671 Pain in right foot: Secondary | ICD-10-CM

## 2024-05-18 DIAGNOSIS — F32A Depression, unspecified: Secondary | ICD-10-CM | POA: Diagnosis present

## 2024-05-18 DIAGNOSIS — Z79890 Hormone replacement therapy (postmenopausal): Secondary | ICD-10-CM

## 2024-05-18 DIAGNOSIS — M810 Age-related osteoporosis without current pathological fracture: Secondary | ICD-10-CM | POA: Diagnosis present

## 2024-05-18 DIAGNOSIS — E43 Unspecified severe protein-calorie malnutrition: Secondary | ICD-10-CM | POA: Diagnosis present

## 2024-05-18 DIAGNOSIS — M7731 Calcaneal spur, right foot: Secondary | ICD-10-CM | POA: Diagnosis present

## 2024-05-18 DIAGNOSIS — I482 Chronic atrial fibrillation, unspecified: Secondary | ICD-10-CM

## 2024-05-18 DIAGNOSIS — K219 Gastro-esophageal reflux disease without esophagitis: Secondary | ICD-10-CM | POA: Diagnosis present

## 2024-05-18 DIAGNOSIS — Z79899 Other long term (current) drug therapy: Secondary | ICD-10-CM

## 2024-05-18 DIAGNOSIS — I776 Arteritis, unspecified: Secondary | ICD-10-CM | POA: Diagnosis present

## 2024-05-18 DIAGNOSIS — Z8673 Personal history of transient ischemic attack (TIA), and cerebral infarction without residual deficits: Secondary | ICD-10-CM

## 2024-05-18 DIAGNOSIS — Z87891 Personal history of nicotine dependence: Secondary | ICD-10-CM

## 2024-05-18 DIAGNOSIS — I4891 Unspecified atrial fibrillation: Secondary | ICD-10-CM | POA: Diagnosis present

## 2024-05-18 DIAGNOSIS — I6381 Other cerebral infarction due to occlusion or stenosis of small artery: Secondary | ICD-10-CM | POA: Diagnosis present

## 2024-05-18 DIAGNOSIS — M2011 Hallux valgus (acquired), right foot: Secondary | ICD-10-CM | POA: Diagnosis present

## 2024-05-18 DIAGNOSIS — G9341 Metabolic encephalopathy: Principal | ICD-10-CM | POA: Diagnosis present

## 2024-05-18 DIAGNOSIS — Z7901 Long term (current) use of anticoagulants: Secondary | ICD-10-CM

## 2024-05-18 DIAGNOSIS — Z993 Dependence on wheelchair: Secondary | ICD-10-CM

## 2024-05-18 DIAGNOSIS — Z8719 Personal history of other diseases of the digestive system: Secondary | ICD-10-CM

## 2024-05-18 DIAGNOSIS — Z882 Allergy status to sulfonamides status: Secondary | ICD-10-CM

## 2024-05-18 DIAGNOSIS — D649 Anemia, unspecified: Secondary | ICD-10-CM | POA: Diagnosis present

## 2024-05-18 DIAGNOSIS — R7881 Bacteremia: Secondary | ICD-10-CM | POA: Diagnosis present

## 2024-05-18 DIAGNOSIS — M341 CR(E)ST syndrome: Secondary | ICD-10-CM | POA: Diagnosis present

## 2024-05-18 DIAGNOSIS — H9190 Unspecified hearing loss, unspecified ear: Secondary | ICD-10-CM | POA: Diagnosis present

## 2024-05-18 DIAGNOSIS — R29701 NIHSS score 1: Secondary | ICD-10-CM | POA: Diagnosis present

## 2024-05-18 DIAGNOSIS — I10 Essential (primary) hypertension: Secondary | ICD-10-CM | POA: Diagnosis present

## 2024-05-18 DIAGNOSIS — E039 Hypothyroidism, unspecified: Secondary | ICD-10-CM | POA: Diagnosis present

## 2024-05-18 DIAGNOSIS — E876 Hypokalemia: Secondary | ICD-10-CM | POA: Diagnosis present

## 2024-05-18 LAB — CBC WITH DIFFERENTIAL/PLATELET
Abs Immature Granulocytes: 0.09 10*3/uL — ABNORMAL HIGH (ref 0.00–0.07)
Basophils Absolute: 0 10*3/uL (ref 0.0–0.1)
Basophils Relative: 0 %
Eosinophils Absolute: 0 10*3/uL (ref 0.0–0.5)
Eosinophils Relative: 0 %
HCT: 36 % (ref 36.0–46.0)
Hemoglobin: 11.5 g/dL — ABNORMAL LOW (ref 12.0–15.0)
Immature Granulocytes: 1 %
Lymphocytes Relative: 16 %
Lymphs Abs: 1.7 10*3/uL (ref 0.7–4.0)
MCH: 26.8 pg (ref 26.0–34.0)
MCHC: 31.9 g/dL (ref 30.0–36.0)
MCV: 83.9 fL (ref 80.0–100.0)
Monocytes Absolute: 1.2 10*3/uL — ABNORMAL HIGH (ref 0.1–1.0)
Monocytes Relative: 11 %
Neutro Abs: 8.1 10*3/uL — ABNORMAL HIGH (ref 1.7–7.7)
Neutrophils Relative %: 72 %
Platelets: 254 10*3/uL (ref 150–400)
RBC: 4.29 MIL/uL (ref 3.87–5.11)
RDW: 17.5 % — ABNORMAL HIGH (ref 11.5–15.5)
WBC: 11.1 10*3/uL — ABNORMAL HIGH (ref 4.0–10.5)
nRBC: 0 % (ref 0.0–0.2)

## 2024-05-18 LAB — URINALYSIS, ROUTINE W REFLEX MICROSCOPIC
Bilirubin Urine: NEGATIVE
Glucose, UA: NEGATIVE mg/dL
Hgb urine dipstick: NEGATIVE
Ketones, ur: NEGATIVE mg/dL
Leukocytes,Ua: NEGATIVE
Nitrite: NEGATIVE
Protein, ur: NEGATIVE mg/dL
Specific Gravity, Urine: 1.006 (ref 1.005–1.030)
pH: 6 (ref 5.0–8.0)

## 2024-05-18 LAB — COMPREHENSIVE METABOLIC PANEL WITH GFR
ALT: 8 U/L (ref 0–44)
AST: 25 U/L (ref 15–41)
Albumin: 3.9 g/dL (ref 3.5–5.0)
Alkaline Phosphatase: 119 U/L (ref 38–126)
Anion gap: 18 — ABNORMAL HIGH (ref 5–15)
BUN: 23 mg/dL (ref 8–23)
CO2: 24 mmol/L (ref 22–32)
Calcium: 7.3 mg/dL — ABNORMAL LOW (ref 8.9–10.3)
Chloride: 89 mmol/L — ABNORMAL LOW (ref 98–111)
Creatinine, Ser: 1.01 mg/dL — ABNORMAL HIGH (ref 0.44–1.00)
GFR, Estimated: 55 mL/min — ABNORMAL LOW
Glucose, Bld: 91 mg/dL (ref 70–99)
Potassium: 3.8 mmol/L (ref 3.5–5.1)
Sodium: 131 mmol/L — ABNORMAL LOW (ref 135–145)
Total Bilirubin: 0.6 mg/dL (ref 0.0–1.2)
Total Protein: 7.3 g/dL (ref 6.5–8.1)

## 2024-05-18 LAB — FOLATE: Folate: 16.5 ng/mL

## 2024-05-18 LAB — CBG MONITORING, ED: Glucose-Capillary: 150 mg/dL — ABNORMAL HIGH (ref 70–99)

## 2024-05-18 LAB — TSH: TSH: 2.86 u[IU]/mL (ref 0.350–4.500)

## 2024-05-18 LAB — AMMONIA: Ammonia: <13 umol/L (ref 9–35)

## 2024-05-18 LAB — VITAMIN B12: Vitamin B-12: 573 pg/mL (ref 180–914)

## 2024-05-18 LAB — MAGNESIUM: Magnesium: 0.8 mg/dL — CL (ref 1.7–2.4)

## 2024-05-18 MED ORDER — APIXABAN 2.5 MG PO TABS
2.5000 mg | ORAL_TABLET | Freq: Two times a day (BID) | ORAL | Status: DC
  Administered 2024-05-19 – 2024-05-26 (×16): 2.5 mg via ORAL
  Filled 2024-05-18 (×16): qty 1

## 2024-05-18 MED ORDER — METOPROLOL TARTRATE 50 MG PO TABS
100.0000 mg | ORAL_TABLET | Freq: Two times a day (BID) | ORAL | Status: DC
  Administered 2024-05-19 – 2024-05-25 (×13): 100 mg via ORAL
  Filled 2024-05-18 (×16): qty 2

## 2024-05-18 MED ORDER — OXYCODONE-ACETAMINOPHEN 5-325 MG PO TABS
1.0000 | ORAL_TABLET | ORAL | Status: DC | PRN
  Filled 2024-05-18: qty 1

## 2024-05-18 MED ORDER — CITALOPRAM HYDROBROMIDE 10 MG PO TABS
5.0000 mg | ORAL_TABLET | Freq: Every day | ORAL | Status: DC
  Administered 2024-05-19 – 2024-05-24 (×6): 5 mg via ORAL
  Filled 2024-05-18: qty 1

## 2024-05-18 MED ORDER — LEVOTHYROXINE SODIUM 112 MCG PO TABS
112.0000 ug | ORAL_TABLET | Freq: Every day | ORAL | Status: DC
  Administered 2024-05-19 – 2024-05-26 (×8): 112 ug via ORAL
  Filled 2024-05-18 (×8): qty 1

## 2024-05-18 MED ORDER — ESCITALOPRAM OXALATE 5 MG PO TABS: 2.5000 mg | ORAL_TABLET | Freq: Every day | ORAL | Status: DC

## 2024-05-18 MED ORDER — MAGNESIUM SULFATE 2 GM/50ML IV SOLN
2.0000 g | Freq: Once | INTRAVENOUS | Status: AC
  Administered 2024-05-18: 2 g via INTRAVENOUS
  Filled 2024-05-18: qty 50

## 2024-05-18 MED ORDER — IOHEXOL 350 MG/ML SOLN
125.0000 mL | Freq: Once | INTRAVENOUS | Status: AC | PRN
  Administered 2024-05-18: 125 mL via INTRAVENOUS

## 2024-05-18 MED ORDER — PANTOPRAZOLE SODIUM 40 MG PO TBEC
40.0000 mg | DELAYED_RELEASE_TABLET | Freq: Every day | ORAL | Status: DC
  Administered 2024-05-19 – 2024-05-26 (×9): 40 mg via ORAL
  Filled 2024-05-18 (×10): qty 1

## 2024-05-18 MED ORDER — LACTATED RINGERS IV BOLUS
500.0000 mL | Freq: Once | INTRAVENOUS | Status: AC
  Administered 2024-05-18: 500 mL via INTRAVENOUS

## 2024-05-18 NOTE — ED Triage Notes (Signed)
 Pt bib [facility] from [facility] with complaints of altered mental status. Also complaints of right foot pain.

## 2024-05-18 NOTE — ED Notes (Signed)
 Patient transported to CT

## 2024-05-18 NOTE — ED Provider Notes (Signed)
 " [place] EMERGENCY DEPARTMENT AT [facility] Provider Note   CSN: [id number] Arrival date & time: 05/18/24  1306     Patient presents with: Altered Mental Status   [name] is a 84 y.o. female.    Altered Mental Status Presenting symptoms: confusion   Patient presents for altered mental status.  Medical history includes HTN, atrial fibrillation, HLD, crest syndrome, anemia, SBO, depression, GERD, CVA.  She arrives from nursing facility for concern of altered mental status.  Per facility, this began on Saturday and has worsened over the past 2 days.  They report that she is typically able to ambulate with a walker.  For the past month, she has had increasing pain in her right foot which has limited her to being wheelchair-bound.  She was seen by the podiatry office for her right foot pain.  They stated that there were no surgical options for her.  She has had Unna boot on since last week.  Patient, herself, states that she feels fine and is unaware of what the facility's concerns were.       Prior to Admission medications  Medication Sig Start Date End Date Taking? Authorizing Provider  apixaban  (ELIQUIS ) 2.5 MG TABS tablet Take 1 tablet (2.5 mg total) by mouth 2 (two) times daily. 07/03/22   Regalado, Belkys A, MD  atorvastatin  (LIPITOR) 20 MG tablet Take 20 mg by mouth daily.    [provider]  calcium  carbonate (TUMS - DOSED IN MG ELEMENTAL CALCIUM ) 500 MG chewable tablet Chew 1 tablet by mouth 2 (two) times daily with a meal.    [provider]  Clobetasol Propionate 0.05 % shampoo Apply 1 application  topically See admin instructions. On shower day 11/06/22   [provider]  escitalopram  (LEXAPRO ) 5 MG tablet Take 2.5 mg by mouth at bedtime.    [provider]  ferrous sulfate  325 (65 FE) MG tablet Take 325 mg by mouth 3 (three) times daily with meals.    [provider]  furosemide  (LASIX ) 20 MG tablet Take 3 tablets  (60 mg total) by mouth 2 (two) times daily. 05/13/24   Milford, Harlene HERO, FNP  levothyroxine  (SYNTHROID ) 112 MCG tablet Take 112 mcg by mouth daily before breakfast.    [provider]  Menthol, Topical Analgesic, (BIOFREEZE) 4 % GEL Apply 1 application topically See admin instructions. Apply to right shoulder twice a day    [provider]  metoprolol  tartrate (LOPRESSOR ) 100 MG tablet Take 1 tablet (100 mg total) by mouth 2 (two) times daily. 11/21/23 05/12/24  Shlomo Wilbert SAUNDERS, MD  mirtazapine  (REMERON ) 15 MG tablet Take 15 mg by mouth at bedtime.    [provider]  omeprazole (PRILOSEC) 20 MG capsule Take 20 mg by mouth daily.    [provider]  OXYGEN Inhale 2 L into the lungs 2 (two) times daily as needed (Shortness of breath and when oxygen levels drops below 92%).    [provider]  polyethylene glycol (MIRALAX  / GLYCOLAX ) 17 g packet Take 17 g by mouth daily as needed for mild constipation or moderate constipation. 11/16/21   Pappayliou, Dorothyann A, DO  potassium chloride  SA (KLOR-CON  M) 20 MEQ tablet Take 1 tablet (20 mEq total) by mouth daily. 05/13/24   Glena Harlene HERO, FNP  Probiotic Product (ACIDOPHILUS PROBIOTIC BLEND) CAPS Take 1 capsule by mouth daily. 09/17/22   [provider]  promethazine (PHENERGAN) 12.5 MG tablet Take 12.5 mg by mouth  daily as needed for nausea or vomiting.    [provider]  Propylene Glycol (SYSTANE BALANCE OP) Place 1 drop into both eyes 2 (two) times daily.    [provider]  Salicylic Acid 50 % SOLN by Does not apply route.    [provider]  spironolactone  (ALDACTONE ) 25 MG tablet Take 1 tablet (25 mg total) by mouth daily. 02/13/24 05/13/24  Zenaida Morene PARAS, MD    Allergies: Sulfa antibiotics    Review of Systems  Musculoskeletal:  Positive for arthralgias.  Psychiatric/Behavioral:  Positive for confusion.   All other systems reviewed and are  negative.   Updated Vital Signs BP 133/79   Pulse 99   Temp 97.7 F (36.5 C) (Oral)   Resp 20   Ht 5' 6 (1.676 m)   Wt 57.9 kg   LMP  (LMP Unknown)   SpO2 96%   BMI 20.60 kg/m   Physical Exam Vitals and nursing note reviewed.  Constitutional:      General: She is not in acute distress.    Appearance: Normal appearance. She is well-developed. She is not ill-appearing, toxic-appearing or diaphoretic.  HENT:     Head: Normocephalic and atraumatic.     Right Ear: External ear normal.     Left Ear: External ear normal.     Nose: Nose normal.     Mouth/Throat:     Mouth: Mucous membranes are moist.  Eyes:     Extraocular Movements: Extraocular movements intact.     Conjunctiva/sclera: Conjunctivae normal.  Cardiovascular:     Rate and Rhythm: Normal rate and regular rhythm.  Pulmonary:     Effort: Pulmonary effort is normal. No respiratory distress.     Breath sounds: No wheezing or rales.  Chest:     Chest wall: No tenderness.  Abdominal:     General: There is no distension.     Palpations: Abdomen is soft.     Tenderness: There is no abdominal tenderness.  Musculoskeletal:     Cervical back: Normal range of motion and neck supple.  Skin:    General: Skin is warm and dry.     Coloration: Skin is not jaundiced or pale.  Neurological:     General: No focal deficit present.     Mental Status: She is alert. She is disoriented.  Psychiatric:        Mood and Affect: Mood normal.        Behavior: Behavior normal.     (all labs ordered are listed, but only abnormal results are displayed) Labs Reviewed  COMPREHENSIVE METABOLIC PANEL WITH GFR - Abnormal; Notable for the following components:      Result Value   Sodium 131 (*)    Chloride 89 (*)    Creatinine, Ser 1.01 (*)    Calcium  7.3 (*)    GFR, Estimated 55 (*)    Anion gap 18 (*)    All other components within normal limits  CBC WITH DIFFERENTIAL/PLATELET - Abnormal; Notable for the following components:    WBC 11.1 (*)    Hemoglobin 11.5 (*)    RDW 17.5 (*)    Neutro Abs 8.1 (*)    Monocytes Absolute 1.2 (*)    Abs Immature Granulocytes 0.09 (*)    All other components within normal limits  MAGNESIUM  - Abnormal; Notable for the following components:   Magnesium  0.8 (*)    All other components within normal limits  CBG MONITORING, ED - Abnormal; Notable for  the following components:   Glucose-Capillary 150 (*)    All other components within normal limits  CULTURE, BLOOD (ROUTINE X 2)  CULTURE, BLOOD (ROUTINE X 2)  URINALYSIS, ROUTINE W REFLEX MICROSCOPIC  AMMONIA  TSH  VITAMIN B12  FOLATE  VITAMIN B1    EKG: EKG Interpretation Date/Time:  Monday May 18 2024 14:14:20 EST Ventricular Rate:  88 PR Interval:    QRS Duration:  82 QT Interval:  366 QTC Calculation: 442 R Axis:   92  Text Interpretation: Atrial fibrillation Rightward axis ST & T wave abnormality, consider inferolateral ischemia Abnormal ECG Confirmed by Melvenia Motto ([id number]) on 05/18/2024 3:13:59 PM  Radiology: CT Angio Aortobifemoral W and/or Wo Contrast Result Date: 05/18/2024 EXAM: CTA ABDOMEN AND PELVIS WITH AND WITHOUT CONTRAST AND RUNOFF CTA OF THE LOWER EXTREMITIES WITH CONTRAST 05/18/2024 06:33:57 PM TECHNIQUE: CTA images of the abdomen, pelvis and lower extremities with and without intravenous contrast. Three-dimensional MIP/volume rendered formations were performed. Automated exposure control, iterative reconstruction, and/or weight based adjustment of the mA/kV was utilized to reduce the radiation dose to as low as reasonably achievable. 125 mL of iohexol  (OMNIPAQUE ) 350 MG/ML injection was administered. COMPARISON: CT abdomen and pelvis 10/25/2023. CLINICAL HISTORY: Claudication or leg ischemia. Right foot pain. Altered mental status. FINDINGS: LIMITATIONS/ARTIFACTS: Motion artifact limits the examination. VASCULATURE: Diffuse calcification throughout the visualized vessels. AORTA: Diffuse calcification of the  aorta. No aortic aneurysm or dissection. No focal stenosis. CELIAC TRUNK: Patent. No occlusion or significant stenosis. SUPERIOR MESENTERIC ARTERY: Patent. No occlusion or significant stenosis. INFERIOR MESENTERIC ARTERY: Patent. No occlusion or significant stenosis. RENAL ARTERIES: Bilateral renal arteries are patent. No occlusion or significant stenosis. RIGHT ILIAC ARTERIES: Bilateral common iliac arteries, bilateral internal and external iliac arteries are patent. No occlusion or significant stenosis. RIGHT FEMORAL ARTERIES: Right common femoral, superficial femoral, and deep femoral arteries are patent with diffuse calcification throughout. No occlusion or significant stenosis. RIGHT POPLITEAL ARTERY: Right popliteal artery is patent with diffuse calcification throughout. No occlusion or significant stenosis. RIGHT CALF ARTERIES: 3 vessel runoff to the right ankle is demonstrated, although severe atherosclerotic changes are present throughout the calf vessels. LEFT ILIAC ARTERIES: Bilateral common iliac arteries, bilateral internal and external iliac arteries are patent. No occlusion or significant stenosis. LEFT FEMORAL ARTERIES: Left common femoral, superficial femoral, and popliteal arteries are patent although there is diffuse calcification throughout. No occlusion or significant stenosis. LEFT POPLITEAL ARTERY: Left popliteal artery is patent although there is diffuse calcification throughout. No occlusion or significant stenosis. LEFT CALF ARTERIES: 3 vessel runoff to the left ankle with diffusely diseased vessels. ABDOMEN AND PELVIS: LOWER CHEST: Linear scarring or atelectasis in the lung bases. Trace left pleural effusion. Cardiac enlargement with severe right heart enlargement. LIVER: The liver is unremarkable. GALLBLADDER AND BILE DUCTS: Gallbladder is unremarkable. No biliary ductal dilatation. SPLEEN: The spleen is unremarkable. PANCREAS: The pancreas is unremarkable. ADRENAL GLANDS: Bilateral  adrenal glands demonstrate no acute abnormality. KIDNEYS, URETERS AND BLADDER: The kidneys are unremarkable. No stones in the kidneys or ureters. No hydronephrosis. No evidence of perinephric or periureteral stranding. The bladder is distended but appears otherwise normal. GI AND Bowel: Stomach and duodenal sweep demonstrate no acute abnormality. There is no bowel obstruction. No abnormal bowel wall thickening or distension. REPRODUCTIVE: Reproductive organs are unremarkable. PERITONEUM AND RETROPERITONEUM: No ascites or free air. LYMPH NODES: No significant lymphadenopathy. BONES AND SOFT TISSUES: Degenerative changes in the spine, hips, and knees. No acute bony abnormalities. Postoperative changes in the left ankle.  Mild edema in the subcutaneous fat over the calf regions, greater on the left. IMPRESSION: 1. No occlusion or hemodynamically significant stenosis of the arterial system of the abdomen, pelvis, with diffuse severe vascular calcifications throughout. 2. Severe atherosclerotic changes throughout the runoff vessels bilaterally consistent with diffuse disease although 3 vessel flow is seen bilaterally. Electronically signed by: Elsie Gravely MD 05/18/2024 06:50 PM EST RP Workstation: [id number]   MR BRAIN WO CONTRAST Result Date: 05/18/2024 CLINICAL DATA:  Initial evaluation for acute mental status change, unknown cause. EXAM: MRI HEAD WITHOUT CONTRAST MRA HEAD WITHOUT CONTRAST TECHNIQUE: Multiplanar, multi-echo pulse sequences of the brain and surrounding structures were acquired without intravenous contrast. Angiographic images of the Circle of Willis were acquired using MRA technique without intravenous contrast. COMPARISON:  CT from earlier the same day. FINDINGS: MRI HEAD FINDINGS Brain: Examination severely degraded by motion artifact, limiting assessment. Generalized age-related cerebral volume loss. Patchy and confluent T2/FLAIR hyperintensity involving the periventricular and deep white  matter both cerebral hemispheres, most likely related chronic microvascular ischemic disease, moderately advanced in nature. Encephalomalacia and gliosis involving the left parotid temporal and occipital region, likely related to prior infarct. Mild chronic hemosiderin staining in this region. Single 5 mm focus of mild diffusion signal abnormality seen involving the deep white matter of the posterior left centrum semi ovale (series 5, image 36). No visible associated ADC correlate. Finding likely reflects a small evolving subacute small vessel infarct. No visible associated hemorrhage or mass effect. No other evidence for acute or subacute ischemia. Gray-white matter differentiation otherwise maintained. No mass lesion or midline shift. Ex vacuo dilatation of the left lateral ventricle related to chronic left cerebral encephalomalacia without hydrocephalus. Small holo hemispheric bilateral subdural collections measuring up to 2 mm, slightly greater on the left (series 11, image 15). 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 Pituitary gland within normal limits. Vascular: Major intracranial vascular flow voids are grossly maintained at the skull base. Skull and upper cervical spine: Cranial junction within normal limits. Bone marrow signal intensity grossly normal. No scalp soft tissue abnormality. Sinuses/Orbits: Prior bilateral ocular lens replacement. Chronic left frontoethmoidal and maxillary sinusitis, left OM U obstructive pattern. No significant mastoid effusion. Other: None. MRA HEAD FINDINGS Anterior circulation: Examination severely degraded by motion artifact. The 3D MIP reconstructions are markedly limited as a result. Both internal carotid arteries are patent through the siphons to the termini. Evaluation for possible stenosis or other abnormality fairly limited on this exam. A1 segments patent bilaterally. Grossly normal anterior  communicating artery complex. Anterior cerebral arteries grossly patent to their distal aspects. No visible M1 stenosis or occlusion. No visible proximal MCA branch occlusion. Distal MCA branches grossly perfused bilaterally on this motion degraded exam. Posterior circulation: Visualized distal V4 segments patent without visible stenosis. Neither PICA well visualized. Basilar patent without visible stenosis. Superior cerebellar arteries grossly patent at their origins. Both PCAs grossly patent bilaterally. Anatomic variants: None significant. No obvious aneurysm or other vascular malformation seen on this motion degraded exam. IMPRESSION: MRI HEAD: 1. Severely motion degraded exam. 2. 5 mm focus of diffusion signal abnormality involving the deep white matter of the posterior left centrum semi ovale, likely an evolving subacute small vessel infarct. No associated hemorrhage or mass effect. 3. Small holo hemispheric bilateral subdural collections measuring up to 2 mm, slightly greater on the left. 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 No significant mass effect. 4. Underlying age-related cerebral  volume loss with moderately advanced chronic microvascular ischemic disease. Chronic left temporoccipital infarct. 5. Chronic left frontoethmoidal and maxillary sinusitis, left OMU obstructive pattern. MRA HEAD: 1. Severely motion degraded exam. 2. Grossly negative intracranial MRA for large vessel occlusion. No other definite acute vascular abnormality. Electronically Signed   By: Morene Hoard M.D.   On: 05/18/2024 17:41   MR ANGIO HEAD WO CONTRAST Result Date: 05/18/2024 CLINICAL DATA:  Initial evaluation for acute mental status change, unknown cause. EXAM: MRI HEAD WITHOUT CONTRAST MRA HEAD WITHOUT CONTRAST TECHNIQUE: Multiplanar, multi-echo pulse sequences of the brain and surrounding structures were acquired without intravenous contrast. Angiographic images of the  Circle of Willis were acquired using MRA technique without intravenous contrast. COMPARISON:  CT from earlier the same day. FINDINGS: MRI HEAD FINDINGS Brain: Examination severely degraded by motion artifact, limiting assessment. Generalized age-related cerebral volume loss. Patchy and confluent T2/FLAIR hyperintensity involving the periventricular and deep white matter both cerebral hemispheres, most likely related chronic microvascular ischemic disease, moderately advanced in nature. Encephalomalacia and gliosis involving the left parotid temporal and occipital region, likely related to prior infarct. Mild chronic hemosiderin staining in this region. Single 5 mm focus of mild diffusion signal abnormality seen involving the deep white matter of the posterior left centrum semi ovale (series 5, image 36). No visible associated ADC correlate. Finding likely reflects a small evolving subacute small vessel infarct. No visible associated hemorrhage or mass effect. No other evidence for acute or subacute ischemia. Gray-white matter differentiation otherwise maintained. No mass lesion or midline shift. Ex vacuo dilatation of the left lateral ventricle related to chronic left cerebral encephalomalacia without hydrocephalus. Small holo hemispheric bilateral subdural collections measuring up to 2 mm, slightly greater on the left (series 11, image 15). 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 Pituitary gland within normal limits. Vascular: Major intracranial vascular flow voids are grossly maintained at the skull base. Skull and upper cervical spine: Cranial junction within normal limits. Bone marrow signal intensity grossly normal. No scalp soft tissue abnormality. Sinuses/Orbits: Prior bilateral ocular lens replacement. Chronic left frontoethmoidal and maxillary sinusitis, left OM U obstructive pattern. No significant mastoid effusion. Other: None. MRA  HEAD FINDINGS Anterior circulation: Examination severely degraded by motion artifact. The 3D MIP reconstructions are markedly limited as a result. Both internal carotid arteries are patent through the siphons to the termini. Evaluation for possible stenosis or other abnormality fairly limited on this exam. A1 segments patent bilaterally. Grossly normal anterior communicating artery complex. Anterior cerebral arteries grossly patent to their distal aspects. No visible M1 stenosis or occlusion. No visible proximal MCA branch occlusion. Distal MCA branches grossly perfused bilaterally on this motion degraded exam. Posterior circulation: Visualized distal V4 segments patent without visible stenosis. Neither PICA well visualized. Basilar patent without visible stenosis. Superior cerebellar arteries grossly patent at their origins. Both PCAs grossly patent bilaterally. Anatomic variants: None significant. No obvious aneurysm or other vascular malformation seen on this motion degraded exam. IMPRESSION: MRI HEAD: 1. Severely motion degraded exam. 2. 5 mm focus of diffusion signal abnormality involving the deep white matter of the posterior left centrum semi ovale, likely an evolving subacute small vessel infarct. No associated hemorrhage or mass effect. 3. Small holo hemispheric bilateral subdural collections measuring up to 2 mm, slightly greater on the left. 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 No significant mass effect. 4. Underlying age-related cerebral volume loss with moderately advanced chronic microvascular ischemic  disease. Chronic left temporoccipital infarct. 5. Chronic left frontoethmoidal and maxillary sinusitis, left OMU obstructive pattern. MRA HEAD: 1. Severely motion degraded exam. 2. Grossly negative intracranial MRA for large vessel occlusion. No other definite acute vascular abnormality. Electronically Signed   By: Morene Hoard M.D.   On: 05/18/2024  17:41   CT HEAD WO CONTRAST Result Date: 05/18/2024 EXAM: CT HEAD WITHOUT CONTRAST 05/18/2024 03:05:51 PM TECHNIQUE: CT of the head was performed without the administration of intravenous contrast. Automated exposure control, iterative reconstruction, and/or weight based adjustment of the mA/kV was utilized to reduce the radiation dose to as low as reasonably achievable. COMPARISON: 08/08/2021 CLINICAL HISTORY: Mental status change, unknown cause. FINDINGS: BRAIN AND VENTRICLES: No acute hemorrhage. No evidence of acute infarct. Similar appearance of encephalomalacia throughout the left occipital lobe and the posterior and lateral left temporal lobe compatible with remote infarct. There are subtle areas of cortical hyperattenuation in this region which could reflect cortical laminar necrosis. There is ex vacuo dilatation of the left lateral ventricle. Scattered hypoattenuation in the periventricular and subcortical white matter compatible with moderate chronic microvascular ischemic changes. There is mild generalized volume loss. No hydrocephalus. No extra-axial collection. No mass effect or midline shift. ORBITS: Status post bilateral lens replacement. SINUSES: Near-complete opacification of visualized left maxillary sinus. Complete opacification of left frontal sinus and left ethmoid air cells. Mucosal thickening in right sphenoid sinus. SOFT TISSUES AND SKULL: No acute soft tissue abnormality. No skull fracture. VASCULATURE: Moderate atherosclerotic calcifications in carotid siphons and vertebral arteries. IMPRESSION: 1. No acute intracranial abnormality. 2. Remote left temporal occipital lobe infarct. 3. Moderate chronic microvascular ischemic changes. 4. Paranasal sinus mucosal thickening as above. Electronically signed by: Donnice Mania MD 05/18/2024 03:24 PM EST RP Workstation: [id number]   DG Foot 2 Views Right Result Date: 05/18/2024 EXAM: 1 OR 2 VIEW(S) XRAY OF THE RIGHT FOOT 05/18/2024 02:55:00 PM  COMPARISON: Comparison with 06/20/2020. CLINICAL HISTORY: Right foot pain. FINDINGS: BONES AND JOINTS: No acute fracture. 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 Plantar calcaneal spur. Diffuse bone demineralization. SOFT TISSUES: Unremarkable. IMPRESSION: 1. 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 2. Plantar calcaneal spur. 3. Diffuse bone demineralization. Electronically signed by: Elsie Gravely MD 05/18/2024 03:15 PM EST RP Workstation: [id number]   DG Hip Unilat W or Wo Pelvis 2-3 Views Left Result Date: 05/18/2024 EXAM: 2 or 3 VIEW(S) XRAY OF THE BILATERAL HIPS 05/18/2024 02:55:00 PM COMPARISON: None available. CLINICAL HISTORY: Shortened leg. FINDINGS: BONES AND JOINTS: Remote healed right superior and inferior pubic rami fractures. Mild-to-moderate degenerative changes of the bilateral hips. No acute fracture. No malalignment. LUMBAR SPINE: Degenerative changes of the lower lumbar spine. SOFT TISSUES: Vascular calcifications. IMPRESSION: 1. No acute findings. 2. Remote healed right superior and inferior pubic rami fractures. 3. Mild-to-moderate degenerative changes of the bilateral hips. Electronically signed by: Elsie Gravely MD 05/18/2024 03:14 PM EST RP Workstation: [id number]   DG Chest 1 View Result Date: 05/18/2024 CLINICAL DATA:  shortened leg EXAM: CHEST  1 VIEW COMPARISON:  Chest XR, 04/06/2024.  CT chest, 07/30/2023. FINDINGS: Aortic arch atherosclerosis. Enlarged cardiac silhouette. Lungs are well inflated. No focal consolidation or mass. No pleural effusion or pneumothorax. No acute displaced fracture. IMPRESSION: 1. Cardiomegaly, without acute superimposed cardiopulmonary process 2.  Aortic Atherosclerosis (ICD10-I70.0). Electronically Signed   By: Thom Hall M.D.   On: 05/18/2024 15:11     Procedures   Medications Ordered in the ED  oxyCODONE -acetaminophen  (PERCOCET/ROXICET) 5-325 MG per tablet  1 tablet (has no administration in time  range)  pantoprazole  (PROTONIX ) EC tablet 40 mg (has no administration in time range)  metoprolol  tartrate (LOPRESSOR ) tablet 100 mg (has no administration in time range)  levothyroxine  (SYNTHROID ) tablet 112 mcg (has no administration in time range)  escitalopram  (LEXAPRO ) tablet 2.5 mg (has no administration in time range)  apixaban  (ELIQUIS ) tablet 2.5 mg (has no administration in time range)  magnesium  sulfate IVPB 2 g 50 mL (0 g Intravenous Stopped 05/18/24 1745)  iohexol  (OMNIPAQUE ) 350 MG/ML injection 125 mL (125 mLs Intravenous Contrast Given 05/18/24 1813)  lactated ringers  bolus 500 mL (500 mLs Intravenous New Bag/Given 05/18/24 2138)                                    Medical Decision Making Amount and/or Complexity of Data Reviewed Labs: ordered. Radiology: ordered.  Risk Prescription drug management. Decision regarding hospitalization.   This patient presents to the ED for concern of altered mental status, this involves an extensive number of treatment options, and is a complaint that carries with it a high risk of complications and morbidity.  The differential diagnosis includes neurocognitive disorder, infection, metabolic derangements, CVA, seizure, polypharmacy   Co morbidities / Chronic conditions that complicate the patient evaluation  HTN, atrial fibrillation, HLD, crest syndrome, anemia, SBO, depression, GERD, CVA   Additional history obtained:  Additional history obtained from EMR External records from outside source obtained and reviewed including staff at patient's facility   Lab Tests:  I Ordered, and personally interpreted labs.  The pertinent results include: Mild leukocytosis, baseline creatinine, hypomagnesemia   Imaging Studies ordered:  I ordered imaging studies including x-ray of chest, left hip, right foot; CT of head; MRI/MRI of brain; CTA aortobifemoral I independently visualized and interpreted imaging which showed MRI showed likely  subacute small vessel infarct in deep white matter of posterior left centrum semiovale; CTA showed severe chronic atherosclerotic disease with three-vessel flow seen bilaterally I agree with the radiologist interpretation   Cardiac Monitoring: / EKG:  The patient was maintained on a cardiac monitor.  I personally viewed and interpreted the cardiac monitored which showed an underlying rhythm of: Sinus rhythm   Problem List / ED Course / Critical interventions / Medication management  Patient presenting for concern of altered mental status.  Per facility, this has been ongoing and worsening for the past 2 days.  On arrival in the ED, her vital signs are normal.  She is well-appearing and states that she has no concerns at this time.  She is aware that the facility thought that she was not acting right.  She states that she felt fine at the time of their concerns.  She is currently disoriented.  She does not appear to have any focal neurologic deficits, although exam was limited due to what seems to be confusion and/or hearing loss.  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  She has Unna boots on bilateral lower extremities.  Per facility, these were placed last week due to the worsening pain she has been having in her right foot.  Her left leg does seem shortened, when compared to the right.  Unna boots were removed.  Patient has no significant wounds.  She has a very small wound on left great toe.  She has a small wound on plantar aspect of left foot that does appear to have some  drainage on the bandaging.  Area is indurated but does not seem tender to her.  There does not seem to be any other areas of tenderness. During exam, patient would endorse pain in area of right foot but was unable to specify where and it did not seem to be related to the examination itself.  Right foot does seem cold when compared to left foot.  I was able to Doppler a left PT pulse.  Other pulses  were not able to be identified.  This was using a large Doppler probe.  Small Doppler report is not available currently in the emergency department.  Per chart review, she was seen by vascular surgery last year with consideration of intervention for PAD.  At the time, she had good healing of her wounds and did not undergo any procedures.  Will obtain CTA with runoff today.  Other workup was initiated.  Patient's lab work notable for very slight leukocytosis and hypomagnesemia.  Replacement magnesium  was ordered.  On MRI brain, there is concern of a subacute infarct in the deep white matter of posterior left centrum semiovale.  CTA runoff showed severe atherosclerotic changes bilaterally, however, three-vessel flow is seen bilaterally.  Patient to follow-up with vascular surgery as outpatient given her recently increasing right foot pain.  Regarding MRI finding of subacute stroke, I spoke with neurologist on-call, Dr. Vanessa.  He reviewed imaging and feels like the CVA would not explain her confusion.  He also feels like she is likely optimized on her low-dose Eliquis  given her history of prior SDH.  Per chart review, she did recently go up on her Lasix  to 60 mg twice daily.  This was 6 days ago.  Patient's confusion may be due in part to overdiuresis.  This would also explain her hypomagnesemia.  Gentle IV fluids were ordered.  Neurology recommended additional lab work: Vitamins B1, B6, B12.  These were ordered.  Patient to be admitted for further management. I ordered medication including magnesium  sulfate for hypomagnesemia; Percocet for analgesia Reevaluation of the patient after these medicines showed that the patient stayed the same I have reviewed the patients home medicines and have made adjustments as needed   Consultations Obtained:  I requested consultation with the neurologist, Dr. Vanessa,  and discussed lab and imaging findings as well as pertinent plan - they recommend: Likely optimized  on low-dose Eliquis .  MRI finding would not explain confusion.  Check additional lab work for vitamin levels.   Social Determinants of Health:  Resides in assisted living facility     Final diagnoses:  Cerebrovascular accident (CVA), unspecified mechanism (HCC)  Hypomagnesemia    ED Discharge Orders     None          Melvenia Motto, MD 05/18/24 2200  "

## 2024-05-18 NOTE — H&P (Signed)
 " History and Physical    Patient: [name] FMW:[id number] DOB: 03/22/1941 DOA: 05/18/2024 DOS: the patient was seen and examined on 05/18/2024 PCP: Barbaraann Harvey, FNP  Patient coming from: {Point_of_Origin:[address]}  Chief Complaint:  Chief Complaint  Patient presents with   Altered Mental Status   HPI: [name] is a 84 y.o. 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   Vision abnormalities    as result of stroke   Xerophthalmia    Past Surgical History:  Procedure Laterality Date   ABDOMINAL AORTOGRAM W/LOWER EXTREMITY Left 12/14/2022   Procedure: ABDOMINAL AORTOGRAM W/LOWER EXTREMITY;  Surgeon: Magda Debby SAILOR, MD;  Location: [facility];  Service: Cardiovascular;  Laterality: Left;   CHOLECYSTECTOMY     ESOPHAGEAL DILATION N/A 12/03/2023   Procedure: DILATION, ESOPHAGUS;  Surgeon: Cinderella Deatrice FALCON, MD;  Location: [facility];  Service: Endoscopy;  Laterality: N/A;   ESOPHAGOGASTRODUODENOSCOPY N/A 12/03/2023   Procedure: EGD (ESOPHAGOGASTRODUODENOSCOPY);  Surgeon: Cinderella Deatrice FALCON, MD;  Location: [facility];  Service: Endoscopy;  Laterality: N/A;  1:30PM, ASA 3 RESIDENT AT [facility]   leg surgery     TONSILLECTOMY     Social History:  reports that she has quit smoking. She has never used smokeless tobacco. She reports that she does not drink alcohol and does not use drugs.  Allergies[1]  Family History  Family history unknown: Yes    Prior to Admission medications  Medication Sig Start Date End Date Taking? Authorizing Provider  apixaban  (ELIQUIS ) 2.5 MG TABS tablet Take 1 tablet (2.5 mg total) by mouth 2 (two) times daily.  07/03/22   Regalado, Belkys A, MD  atorvastatin  (LIPITOR) 20 MG tablet Take 20 mg by mouth daily.    [provider]  calcium  carbonate (TUMS - DOSED IN MG ELEMENTAL CALCIUM ) 500 MG chewable tablet Chew 1 tablet by mouth 2 (two) times daily with a meal.    [provider]  Clobetasol Propionate 0.05 % shampoo Apply 1 application  topically See admin instructions. On shower day 11/06/22   [provider]  escitalopram  (LEXAPRO ) 5 MG tablet Take 2.5 mg by mouth at bedtime.    [provider]  ferrous sulfate  325 (65 FE) MG tablet Take 325 mg by mouth 3 (three) times daily with meals.    [provider]  furosemide  (LASIX ) 20 MG tablet Take 3 tablets (60 mg total) by mouth 2 (two) times daily. 05/13/24   Glena Harlene HERO, FNP  levothyroxine  (SYNTHROID ) 112 MCG tablet Take 112 mcg by mouth daily before breakfast.    [provider]  Menthol, Topical Analgesic, (BIOFREEZE) 4 % GEL Apply 1 application topically See admin instructions. Apply to right shoulder twice a day    [provider]  metoprolol  tartrate (LOPRESSOR ) 100 MG tablet Take 1 tablet (100 mg total) by mouth 2 (two) times daily. 11/21/23 05/12/24  Shlomo Wilbert SAUNDERS, MD  mirtazapine  (REMERON ) 15 MG tablet Take 15 mg by mouth at bedtime.    [provider]  omeprazole (PRILOSEC) 20 MG capsule Take 20 mg by mouth daily.    [provider]  OXYGEN Inhale 2 L into the lungs 2 (two) times daily as needed (Shortness of breath and when oxygen levels drops below 92%).    [provider]  polyethylene glycol (MIRALAX  / GLYCOLAX ) 17 g packet Take 17 g by mouth daily as needed for mild constipation or moderate constipation. 11/16/21   Pappayliou, Dorothyann A, DO  potassium chloride  SA (KLOR-CON  M) 20 MEQ tablet Take 1 tablet (20 mEq total) by mouth daily. 05/13/24   Glena Harlene HERO, FNP  Probiotic Product (ACIDOPHILUS PROBIOTIC BLEND) CAPS Take 1 capsule by mouth daily.  09/17/22   [provider]  promethazine (PHENERGAN) 12.5 MG tablet Take 12.5 mg by mouth daily as needed for nausea or vomiting.    [provider]  Propylene Glycol (SYSTANE BALANCE OP) Place 1 drop into both eyes 2 (two) times daily.    [provider]  Salicylic Acid 50 % SOLN by Does not apply route.    [provider]  spironolactone  (ALDACTONE ) 25 MG tablet Take 1 tablet (25 mg total) by mouth daily. 02/13/24 05/13/24  Zenaida Morene PARAS, MD    Physical Exam: Vitals:   05/18/24 1800 05/18/24 1845 05/18/24 1945 05/18/24 2200  BP: 115/78 (!) 153/88 133/79 124/81  Pulse: (!) 103 97 99 96  Resp: 16 13 20  (!) 22  Temp:   97.7 F (36.5 C)   TempSrc:   Oral   SpO2: 92% 96% 96% 94%  Weight:      Height:       *** Data Reviewed: {Tip this will not be part of the note when signed- Document your independent interpretation of telemetry tracing, EKG, lab, Radiology test or any other diagnostic tests. Add any new diagnostic test ordered today. (Optional):26781} {Results:26384}  Assessment and Plan: No notes have been filed under this hospital service. Service: Hospitalist     Advance Care Planning:   Code Status: Prior ***  Consults: ***  Family Communication: ***  Severity of Illness: {Observation/Inpatient:21159}  Author: Posey Maier, DO 05/18/2024 10:11 PM  For on call review [email or web address].     [1]  Allergies Allergen Reactions   Sulfa Antibiotics Other (See Comments)    Unknown   "

## 2024-05-19 DIAGNOSIS — I639 Cerebral infarction, unspecified: Secondary | ICD-10-CM | POA: Insufficient documentation

## 2024-05-19 LAB — COMPREHENSIVE METABOLIC PANEL WITH GFR
ALT: 6 U/L (ref 0–44)
AST: 20 U/L (ref 15–41)
Albumin: 3.5 g/dL (ref 3.5–5.0)
Alkaline Phosphatase: 113 U/L (ref 38–126)
Anion gap: 16 — ABNORMAL HIGH (ref 5–15)
BUN: 18 mg/dL (ref 8–23)
CO2: 24 mmol/L (ref 22–32)
Calcium: 7.2 mg/dL — ABNORMAL LOW (ref 8.9–10.3)
Chloride: 92 mmol/L — ABNORMAL LOW (ref 98–111)
Creatinine, Ser: 0.82 mg/dL (ref 0.44–1.00)
GFR, Estimated: >60 mL/min
Glucose, Bld: 87 mg/dL (ref 70–99)
Potassium: 3.4 mmol/L — ABNORMAL LOW (ref 3.5–5.1)
Sodium: 132 mmol/L — ABNORMAL LOW (ref 135–145)
Total Bilirubin: 0.7 mg/dL (ref 0.0–1.2)
Total Protein: 6.5 g/dL (ref 6.5–8.1)

## 2024-05-19 LAB — CBC
HCT: 36 % (ref 36.0–46.0)
Hemoglobin: 11.5 g/dL — ABNORMAL LOW (ref 12.0–15.0)
MCH: 26.6 pg (ref 26.0–34.0)
MCHC: 31.9 g/dL (ref 30.0–36.0)
MCV: 83.1 fL (ref 80.0–100.0)
Platelets: 203 10*3/uL (ref 150–400)
RBC: 4.33 MIL/uL (ref 3.87–5.11)
RDW: 17.3 % — ABNORMAL HIGH (ref 11.5–15.5)
WBC: 9.1 10*3/uL (ref 4.0–10.5)
nRBC: 0 % (ref 0.0–0.2)

## 2024-05-19 LAB — PHOSPHORUS: Phosphorus: 3 mg/dL (ref 2.5–4.6)

## 2024-05-19 LAB — MAGNESIUM: Magnesium: 1.3 mg/dL — ABNORMAL LOW (ref 1.7–2.4)

## 2024-05-19 MED ORDER — ACETAMINOPHEN 650 MG RE SUPP: 650.0000 mg | Freq: Four times a day (QID) | RECTAL | Status: DC | PRN

## 2024-05-19 MED ORDER — ATORVASTATIN CALCIUM 20 MG PO TABS
20.0000 mg | ORAL_TABLET | Freq: Every day | ORAL | Status: DC
  Administered 2024-05-19 – 2024-05-26 (×8): 20 mg via ORAL
  Filled 2024-05-19 (×8): qty 1

## 2024-05-19 MED ORDER — MAGNESIUM SULFATE 2 GM/50ML IV SOLN
2.0000 g | Freq: Once | INTRAVENOUS | Status: AC
  Administered 2024-05-19: 2 g via INTRAVENOUS
  Filled 2024-05-19: qty 50

## 2024-05-19 MED ORDER — ONDANSETRON HCL 4 MG/2ML IJ SOLN: 4.0000 mg | Freq: Four times a day (QID) | INTRAMUSCULAR | Status: DC | PRN

## 2024-05-19 MED ORDER — ONDANSETRON HCL 4 MG PO TABS: 4.0000 mg | ORAL_TABLET | Freq: Four times a day (QID) | ORAL | Status: DC | PRN

## 2024-05-19 MED ORDER — SODIUM CHLORIDE 0.9 % IV SOLN: INTRAVENOUS | Status: DC

## 2024-05-19 MED ORDER — CALCIUM CARBONATE ANTACID 500 MG PO CHEW
1.0000 | CHEWABLE_TABLET | Freq: Two times a day (BID) | ORAL | Status: DC
  Administered 2024-05-19 – 2024-05-26 (×14): 200 mg via ORAL
  Filled 2024-05-19 (×14): qty 1

## 2024-05-19 MED ORDER — ACETAMINOPHEN 325 MG PO TABS: 650.0000 mg | ORAL_TABLET | Freq: Four times a day (QID) | ORAL | Status: DC | PRN

## 2024-05-19 NOTE — Plan of Care (Signed)
" °  Problem: Acute Rehab PT Goals(only PT should resolve) Goal: Pt Will Go Supine/Side To Sit Outcome: Progressing Flowsheets (Taken 05/19/2024 1542) Pt will go Supine/Side to Sit: with minimal assist Goal: Patient Will Transfer Sit To/From Stand Outcome: Progressing Flowsheets (Taken 05/19/2024 1542) Patient will transfer sit to/from stand: with minimal assist Goal: Pt Will Transfer Bed To Chair/Chair To Bed Outcome: Progressing Flowsheets (Taken 05/19/2024 1542) Pt will Transfer Bed to Chair/Chair to Bed: with min assist Goal: Pt Will Ambulate Outcome: Progressing Flowsheets (Taken 05/19/2024 1542) Pt will Ambulate:  15 feet  with moderate assist  with rolling walker   3:43 PM, 05/19/24 Lynwood Music, MPT Physical Therapist with [facility] [phone] office [phone] mobile phone  "

## 2024-05-19 NOTE — Progress Notes (Signed)
 " Progress Note    [name]   FMW:[id number]  DOB: [birth year]  DOA: 05/18/2024     0 PCP: Barbaraann Harvey, FNP  Initial CC: Confusion  Hospital Course: Paw Karstens is an 84 y.o. female with medical history significant of hypertension, hyperlipidemia, atrial fibrillation, GERD, CVA who presents to the emergency department via EMS from [facility] due to altered mental status.  At bedside, patient was unable to provide history, history was obtained from EDP.  Per report, she had 2 days of onset of worsening mental status.  Patient has about 1 month of being wheelchair-bound due to increasing right foot pain (at baseline, she was able to ambulate with a walker).  Patient has been seen by a podiatrist for right foot pain, but otherwise no surgical options for her.  She had Unna boot which was placed about a week ago.   ED course In the emergency department, she was tachycardic, but otherwise respiratory normal rate.  Workup in the ED showed normal CBC except for WBC of 11.1 and normocytic anemia.  BMP was significant for sodium of 121, chloride 89, creatinine 1.01, magnesium  0.8, calcium  7.3 MRI head without contrast was suggestive of subacute small vessel infarct MRA of head showed no large vessel occlusion Neurologist on-call, Dr. Vanessa was consulted and recommended additional lab work including vitamin B1, B6 and B12 Prednisone was replenished, Percocet was given, and hydration was provided.  A&P:  Subacute CVA CT head without contrast showed no acute intracranial abnormality MRI head without contrast was suggestive of subacute small vessel infarct MRA of head showed no large vessel occlusion Neurologist on-call Dr. Vanessa was consulted and thought that patient was already optimized on low-dose Eliquis  per EDP. Continue Eliquis , Lipitor Continue with PT/OT eval and treat   Acute metabolic encephalopathy possibly secondary to above MRI findings above would not explain  patient's confusion per neurologist Vitamin B1, B6 and B12 levels were recommended Continue fall precaution, delirium precaution - vitamin B1 pending -B12 573, TSH 2.860   Hypomagnesemia -Replete as needed   Hyponatremia possibly secondary to dehydration Na 131, this may be due to overdiuresis due to recent increased Lasix  to 60 mg twice daily per EDP Continue gentle hydration and monitor sodium level.   Right foot pain Right foot x-ray showed Hallux valgus with medial subluxation of the first MTP joint and plantar calcaneal spur and diffuse bone demineralization Continue Percocet Continue fall precaution Continue PT/OT eval and treat   Acquired hypothyroidism Continue Synthroid    Atrial fibrillation with controlled ventricular rate  Continue Eliquis , Lopressor    Essential hypertension Continue Lopressor    Mixed hyperlipidemia Continue Lipitor   GERD Continue PPI   Hypocalcemia Continue calcium  carbonate  Interval History:  Seen in the ER this morning.  Hard of hearing and still confused.  Difficulty with following exam instructions.  Unable to provide any collateral information.  Antimicrobials:   Consultants:    Procedures:    DVT prophylaxis:  SCDs Start: 05/19/24 0129 apixaban  (ELIQUIS ) tablet 2.5 mg Start: 05/18/24 2200 apixaban  (ELIQUIS ) tablet 2.5 mg   Code Status:   Code Status: Full Code  Barriers to discharge: None Therapy evaluation: PT Orders: Active   PT Follow up Rec: Skilled Nursing-Short Term Rehab (<3 Hours/Day)05/19/2024 1536  Disposition Plan: SNF Status is: Observation  Mobility Assessment (Last 72 Hours)     Mobility Assessment     Row Name 05/19/24 1536 05/19/24 1416 05/19/24 1400       Does the patient have exclusion  criteria? -- No- Perform mobility assessment --     What is the highest level of mobility based on the mobility assessment? Level 3 (Stands with assistance) - Balance while standing  and cannot march in place  Level 2 (Chairfast) - Balance while sitting on edge of bed and cannot stand Level 3 (Stands with assistance) - Balance while standing  and cannot march in place     Is the above level different from baseline mobility prior to current illness? -- Yes - Recommend PT order --        Diet: Diet Orders (From admission, onward)     Start     Ordered   05/19/24 0130  Diet Heart Room service appropriate? Yes; Fluid consistency: Thin  Diet effective now       Question Answer Comment  Room service appropriate? Yes   Fluid consistency: Thin      05/19/24 0130            Objective: Blood pressure 115/65, pulse 74, temperature 97.8 F (36.6 C), temperature source Oral, resp. rate 16, height 5' 6 (1.676 m), weight 57.9 kg, SpO2 95%.  Examination:  Physical Exam Constitutional:      Comments: Hard of hearing and grossly confused  HENT:     Head: Normocephalic and atraumatic.     Mouth/Throat:     Mouth: Mucous membranes are moist.  Eyes:     Extraocular Movements: Extraocular movements intact.  Cardiovascular:     Rate and Rhythm: Normal rate and regular rhythm.  Pulmonary:     Effort: Pulmonary effort is normal. No respiratory distress.     Breath sounds: Normal breath sounds. No wheezing.  Abdominal:     General: Bowel sounds are normal. There is no distension.     Palpations: Abdomen is soft.     Tenderness: There is no abdominal tenderness.  Musculoskeletal:        General: Normal range of motion.     Cervical back: Normal range of motion and neck supple.  Skin:    General: Skin is warm and dry.  Neurological:     Mental Status: She is disoriented.     Comments: Difficulty with following instructions however has noticeable right upper extremity weakness  Psychiatric:        Mood and Affect: Mood normal.      Data Reviewed: Results for orders placed or performed during the hospital encounter of 05/18/24 (from the past 24 hours)  Blood culture (routine x 2)     Status:  None (Preliminary result)   Collection Time: 05/18/24  9:39 PM   Specimen: BLOOD  Result Value Ref Range   Specimen Description BLOOD BLOOD RIGHT ARM    Special Requests      BOTTLES DRAWN AEROBIC AND ANAEROBIC Blood Culture adequate volume   Culture      NO GROWTH < 12 HOURS Performed at [facility], [address], [place], [place] [address]    Report Status PENDING   Blood culture (routine x 2)     Status: None (Preliminary result)   Collection Time: 05/18/24  9:39 PM   Specimen: BLOOD  Result Value Ref Range   Specimen Description BLOOD BLOOD LEFT ARM    Special Requests      BOTTLES DRAWN AEROBIC AND ANAEROBIC Blood Culture adequate volume   Culture      NO GROWTH < 12 HOURS Performed at [facility], [address] [place], [place] [address]    Report  Status PENDING   Vitamin B12     Status: None   Collection Time: 05/18/24  9:39 PM  Result Value Ref Range   Vitamin B-12 573 180 - 914 pg/mL  Folate, serum, performed at [facility] lab     Status: None   Collection Time: 05/18/24  9:39 PM  Result Value Ref Range   Folate 16.5 >5.9 ng/mL  Comprehensive metabolic panel     Status: Abnormal   Collection Time: 05/19/24  3:25 AM  Result Value Ref Range   Sodium 132 (L) 135 - 145 mmol/L   Potassium 3.4 (L) 3.5 - 5.1 mmol/L   Chloride 92 (L) 98 - 111 mmol/L   CO2 24 22 - 32 mmol/L   Glucose, Bld 87 70 - 99 mg/dL   BUN 18 8 - 23 mg/dL   Creatinine, Ser 9.17 0.44 - 1.00 mg/dL   Calcium  7.2 (L) 8.9 - 10.3 mg/dL   Total Protein 6.5 6.5 - 8.1 g/dL   Albumin 3.5 3.5 - 5.0 g/dL   AST 20 15 - 41 U/L   ALT 6 0 - 44 U/L   Alkaline Phosphatase 113 38 - 126 U/L   Total Bilirubin 0.7 0.0 - 1.2 mg/dL   GFR, Estimated >39 >39 mL/min   Anion gap 16 (H) 5 - 15  CBC     Status: Abnormal   Collection Time: 05/19/24  3:25 AM  Result Value Ref Range   WBC 9.1 4.0 - 10.5 K/uL   RBC 4.33 3.87 - 5.11 MIL/uL   Hemoglobin 11.5 (L) 12.0 - 15.0 g/dL   HCT 63.9 63.9 - 53.9 %   MCV 83.1  80.0 - 100.0 fL   MCH 26.6 26.0 - 34.0 pg   MCHC 31.9 30.0 - 36.0 g/dL   RDW 82.6 (H) 88.4 - 84.4 %   Platelets 203 150 - 400 K/uL   nRBC 0.0 0.0 - 0.2 %  Magnesium      Status: Abnormal   Collection Time: 05/19/24  3:25 AM  Result Value Ref Range   Magnesium  1.3 (L) 1.7 - 2.4 mg/dL  Phosphorus     Status: None   Collection Time: 05/19/24  3:25 AM  Result Value Ref Range   Phosphorus 3.0 2.5 - 4.6 mg/dL    I have reviewed pertinent nursing notes, vitals, labs, and images as necessary. I have ordered labwork to follow up on as indicated.  I have reviewed the last notes from staff over past 24 hours. I have discussed patient's care plan and test results with nursing staff, CM/SW, and other staff as appropriate.  Old records reviewed in assessment of this patient  Time spent: Greater than 50% of the 55 minute visit was spent in counseling/coordination of care for the patient as laid out in the A&P.   LOS: 0 days   Alm Apo, MD [facility] 05/19/2024, 4:09 PM "

## 2024-05-19 NOTE — TOC Initial Note (Signed)
 Transition of Care [facility]) - Initial/Assessment Note    Patient Details  Name: [name] MRN: [id number] Date of Birth: [birth year]  Transition of Care [facility]) CM/SW Contact:    Noreen KATHEE Cleotilde ISRAEL Phone Number: 05/19/2024, 4:09 PM  Clinical Narrative:                  CSW spoke with Murray at [facility] and then followed up with son. CSW spoke with Murray about patient baseline with transfers , and Murray confirmed that patient required Moderate assistance with her transfers and uses her WC and walker. Murray stated being able to accept patient back once medically stable. Afterwards, CSW called son and provided him with an update and offered HHPT. Patient is currently active with [facility]. Referral sent to [facility]. ICM will continue to follow    Expected Discharge Plan: Assisted Living Barriers to Discharge: Continued Medical Work up   Patient Goals and CMS Choice Patient states their goals for this hospitalization and ongoing recovery are:: DC back to [email or web address] Compare Post Acute Care list provided to:: Other (Comment Required) ([name] - [facility]) Choice offered to / list presented to : Adult Children      Expected Discharge Plan and Services In-house Referral: Clinical Social Work   Post Acute Care Choice: Durable Medical Equipment Living arrangements for the past 2 months: Assisted Living Facility                           HH Arranged: PT HH Agency: [facility] Date [facility] Agency Contacted: 05/19/24 Time HH Agency Contacted: [id number] Representative spoke with at [facility] Agency: Darice  Prior Living Arrangements/Services Living arrangements for the past 2 months: Assisted Living Facility Lives with:: Facility Resident Patient language and need for interpreter reviewed:: Yes Do you feel safe going back to the place where you live?: Yes      Need for Family Participation in Patient Care: Yes (Comment) Care giver support system in place?: Yes  (comment) Current home services: DME Criminal Activity/Legal Involvement Pertinent to Current Situation/Hospitalization: No - Comment as needed  Activities of Daily Living   ADL Screening (condition at time of admission) Independently performs ADLs?: No Does the patient have a NEW difficulty with bathing/dressing/toileting/self-feeding that is expected to last >3 days?: No Does the patient have a NEW difficulty with getting in/out of bed, walking, or climbing stairs that is expected to last >3 days?: No Does the patient have a NEW difficulty with communication that is expected to last >3 days?: No Is the patient deaf or have difficulty hearing?: No Does the patient have difficulty seeing, even when wearing glasses/contacts?: No Does the patient have difficulty concentrating, remembering, or making decisions?: Yes  Permission Sought/Granted      Share Information with NAME: Murray and Arthea     Permission granted to share info w Relationship: [facility] and Son     Emotional Assessment Appearance:: Appears stated age       Alcohol / Substance Use: Not Applicable Psych Involvement: No (comment)  Admission diagnosis:  Hypomagnesemia [E83.42] Acute metabolic encephalopathy [G93.41] Cerebrovascular accident (CVA), unspecified mechanism (HCC) [I63.9] Patient Active Problem List   Diagnosis Date Noted   SBO (small bowel obstruction) (HCC) 10/25/2023   Atrial fibrillation with controlled ventricular rate (HCC) 10/25/2023   Dyslipidemia 10/25/2023   Essential hypertension 10/25/2023   Depression 10/25/2023   Abnormal CT scan, esophagus 09/02/2023   AKI (acute kidney  injury) 06/27/2022   Normocytic anemia 06/27/2022   Hypomagnesemia 08/09/2021   Pneumococcal bacteremia 08/08/2021   Anemia of chronic disease    Pneumococcal pneumonia 08/07/2021   Acute metabolic encephalopathy 08/07/2021   Acute respiratory failure with hypoxia (HCC) 08/07/2021   Septic shock (HCC) 08/07/2021    Hypokalemia 08/07/2021   COVID-19 virus infection 07/07/2021   CAP (community acquired pneumonia) 07/07/2021   Dysphagia 04/23/2021   Vitamin D deficiency 12/13/2020   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 [place] [place] [address] Phone: [phone] Fax: [phone]     Social Drivers of Health (SDOH) Social History: SDOH Screenings   Food Insecurity: No Food Insecurity (05/19/2024)  Housing: Unknown (05/19/2024)  Transportation Needs: No Transportation Needs (05/19/2024)  Utilities: Not At Risk (05/19/2024)  Social Connections: Patient Unable To Answer (05/19/2024)  Tobacco Use: Medium Risk (05/18/2024)   SDOH Interventions:     Readmission Risk Interventions     No data to display

## 2024-05-19 NOTE — Hospital Course (Signed)
 [name] is an 84 y.o. female with medical history significant of hypertension, hyperlipidemia, atrial fibrillation, GERD, CVA who presents to the emergency department via EMS from [facility] due to altered mental status.  At bedside, patient was unable to provide history, history was obtained from EDP.  Per report, she had 2 days of onset of worsening mental status.  Patient has about 1 month of being wheelchair-bound due to increasing right foot pain (at baseline, she was able to ambulate with a walker).  Patient has been seen by a podiatrist for right foot pain, but otherwise no surgical options for her.  She had Unna boot which was placed about a week ago.   ED course In the emergency department, she was tachycardic, but otherwise respiratory normal rate.  Workup in the ED showed normal CBC except for WBC of 11.1 and normocytic anemia.  BMP was significant for sodium of 121, chloride 89, creatinine 1.01, magnesium  0.8, calcium  7.3 MRI head without contrast was suggestive of subacute small vessel infarct MRA of head showed no large vessel occlusion Neurologist on-call, Dr. Vanessa was consulted and recommended additional lab work including vitamin B1, B6 and B12 Prednisone was replenished, Percocet was given, and hydration was provided.  A&P:  Subacute CVA CT head without contrast showed no acute intracranial abnormality MRI head without contrast was suggestive of subacute small vessel infarct MRA of head showed no large vessel occlusion Neurologist on-call Dr. Vanessa was consulted and thought that patient was already optimized on low-dose Eliquis  per EDP. Continue Eliquis , Lipitor Continue with PT/OT eval and treat   Acute metabolic encephalopathy possibly secondary to above MRI findings above would not explain patient's confusion per neurologist Vitamin B1, B6 and B12 levels were recommended Continue fall precaution, delirium precaution - vitamin B1 pending -B12 573, TSH  2.860   Hypomagnesemia -Replete as needed   Hyponatremia possibly secondary to dehydration Na 131, this may be due to overdiuresis due to recent increased Lasix  to 60 mg twice daily per EDP Continue gentle hydration and monitor sodium level.   Right foot pain Right foot x-ray showed Hallux valgus with medial subluxation of the first MTP joint and plantar calcaneal spur and diffuse bone demineralization Continue Percocet Continue fall precaution Continue PT/OT eval and treat   Acquired hypothyroidism Continue Synthroid    Atrial fibrillation with controlled ventricular rate  Continue Eliquis , Lopressor    Essential hypertension Continue Lopressor    Mixed hyperlipidemia Continue Lipitor   GERD Continue PPI   Hypocalcemia Continue calcium  carbonate

## 2024-05-19 NOTE — Evaluation (Signed)
 Occupational Therapy Evaluation Patient Details Name: [name] MRN: [id number] DOB: Aug 02, 1940 Today's Date: 05/19/2024   History of Present Illness   [name] is an 84 y.o. female with medical history significant of hypertension, hyperlipidemia, atrial fibrillation, GERD, CVA who presents to the emergency department via EMS from [facility] due to altered mental status.  At bedside, patient was unable to provide history, history was obtained from EDP.  Per report, she 2 days of onset of worsening mental status.  Patient has about 1 month of being wheelchair-bound due to increasing right foot pain (at baseline, she was able to ambulate with a walker)..  Patient has been seen by a podiatrist for right foot pain, but otherwise no surgical options for her.  She had Unna boot which was placed about a week ago. (per DO)     Clinical Impressions Pt agreeable to OT and PT co-evaluation. Pt alert but oriented only to self. Pt required mod A for bed mobility and functional transfer to chair with RW. Pt noted to have urinated in the bed demonstrating need for much assist for lower body ADL tasks. B UE limited in A/ROM with deficits in strength as well. Pt left in the chair with call bell within reach. Pt will benefit from continued OT in the hospital to increase strength, balance, and endurance for safe ADL's.        If plan is discharge home, recommend the following:   A lot of help with walking and/or transfers;A lot of help with bathing/dressing/bathroom;Assistance with cooking/housework;Direct supervision/assist for medications management;Assist for transportation;Help with stairs or ramp for entrance     Functional Status Assessment   Patient has had a recent decline in their functional status and demonstrates the ability to make significant improvements in function in a reasonable and predictable amount of time.     Equipment Recommendations   None recommended by OT              Precautions/Restrictions   Precautions Precautions: Fall Recall of Precautions/Restrictions: Impaired Restrictions Weight Bearing Restrictions Per Provider Order: No     Mobility Bed Mobility Overal bed mobility: Needs Assistance Bed Mobility: Supine to Sit     Supine to sit: Mod assist, HOB elevated     General bed mobility comments: labored effort; assist to move  B LE to EOB and pull to sit.    Transfers Overall transfer level: Needs assistance Equipment used: Rolling walker (2 wheels) Transfers: Sit to/from Stand, Bed to chair/wheelchair/BSC Sit to Stand: Mod assist     Step pivot transfers: Mod assist     General transfer comment: EOB to chair with RW.      Balance Overall balance assessment: Needs assistance Sitting-balance support: No upper extremity supported, Feet supported Sitting balance-Leahy Scale: Fair Sitting balance - Comments: fair to good seated at EOB.   Standing balance support: Bilateral upper extremity supported, During functional activity, Reliant on assistive device for balance Standing balance-Leahy Scale: Poor Standing balance comment: using RW                           ADL either performed or assessed with clinical judgement   ADL Overall ADL's : Needs assistance/impaired     Grooming: Set up;Sitting;Minimal assistance   Upper Body Bathing: Set up;Minimal assistance;Sitting   Lower Body Bathing: Maximal assistance;Sitting/lateral leans;Moderate assistance   Upper Body Dressing : Set up;Sitting;Minimal assistance   Lower Body Dressing: Maximal assistance;Sitting/lateral leans  Toilet Transfer: Moderate assistance;Stand-pivot Toilet Transfer Details (indicate cue type and reason): EOB to chair with RW Toileting- Clothing Manipulation and Hygiene: Maximal assistance;Bed level;Total assistance;Sit to/from stand               Vision Baseline Vision/History: 1 Wears glasses Ability to See in  Adequate Light: 0 Adequate Patient Visual Report: No change from baseline Vision Assessment?: No apparent visual deficits     Perception Perception: Not tested       Praxis Praxis: Not tested       Pertinent Vitals/Pain Pain Assessment Pain Assessment: No/denies pain     Extremity/Trunk Assessment Upper Extremity Assessment Upper Extremity Assessment: Generalized weakness;LUE deficits/detail (3-/5 B shoulder flexion with ~90% of available range for  P/ROM of R shoulder and L shoulder limited to ~75% or less of available P/ROM.) LUE Deficits / Details: 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   Lower Extremity Assessment Lower Extremity Assessment: Defer to PT evaluation   Cervical / Trunk Assessment Cervical / Trunk Assessment: Kyphotic   Communication Communication Communication: Impaired Factors Affecting Communication: Hearing impaired   Cognition Arousal: Alert Behavior During Therapy: WFL for tasks assessed/performed Cognition: No family/caregiver present to determine baseline, Cognition impaired   Orientation impairments: Person         OT - Cognition Comments: Pt oreinted to person only. No ALF staff present to provide information on pt's baseline cognition.                 Following commands: Intact       Cueing  General Comments   Cueing Techniques: Verbal cues;Tactile cues;Gestural cues                 Home Living Family/patient expects to be discharged to:: Assisted living                             Home Equipment: Rolling Walker (2 wheels)          Prior Functioning/Environment Prior Level of Function : Needs assist;Patient poor historian/Family not available       Physical Assist : Mobility (physical);ADLs (physical) Mobility (physical): Transfers;Gait;Stairs ADLs (physical): IADLs Mobility Comments: Pt reports ambulation with RW. Chart indicates pt has had to use a w/c the last  month. ADLs Comments: Pt reports independence with ADL's. Pt is a poor historian. IADL's assisted by staff most likely.    OT Problem List: Decreased strength;Decreased range of motion;Decreased activity tolerance;Impaired balance (sitting and/or standing);Decreased cognition   OT Treatment/Interventions: Self-care/ADL training;Therapeutic exercise;DME and/or AE instruction;Therapeutic activities;Patient/family education;Cognitive remediation/compensation;Balance training      OT Goals(Current goals can be found in the care plan section)   Acute Rehab OT Goals Patient Stated Goal: Improve function. OT Goal Formulation: With patient Time For Goal Achievement: 06/02/24 Potential to Achieve Goals: Fair   OT Frequency:  Min 2X/week    Co-evaluation PT/OT/SLP Co-Evaluation/Treatment: Yes Reason for Co-Treatment: To address functional/ADL transfers   OT goals addressed during session: ADL's and self-care      AM-PAC OT 6 Clicks Daily Activity     Outcome Measure Help from another person eating meals?: A Little Help from another person taking care of personal grooming?: A Little Help from another person toileting, which includes using toliet, bedpan, or urinal?: A Lot Help from another person bathing (including washing, rinsing, drying)?: A Lot Help from another person to put on and taking off regular upper body  clothing?: A Little Help from another person to put on and taking off regular lower body clothing?: A Lot 6 Click Score: 15   End of Session Equipment Utilized During Treatment: Rolling walker (2 wheels) Nurse Communication: Other (comment) (Notified that pt urinated in the bed.)  Activity Tolerance: Patient tolerated treatment well Patient left: in chair;with call bell/phone within reach  OT Visit Diagnosis: Unsteadiness on feet (R26.81);Other abnormalities of gait and mobility (R26.89);Muscle weakness (generalized) (M62.81);Other symptoms and signs involving cognitive  function                Time: 8866-8847 OT Time Calculation (min): 19 min Charges:  OT General Charges $OT Visit: 1 Visit OT Evaluation $OT Eval Low Complexity: 1 Low  Jahzaria Vary OT, MOT  Jayson Person 05/19/2024, 2:42 PM

## 2024-05-19 NOTE — Plan of Care (Signed)
" °  Problem: Acute Rehab OT Goals (only OT should resolve) Goal: Pt. Will Perform Grooming Flowsheets (Taken 05/19/2024 1443) Pt Will Perform Grooming:  with supervision  standing  with contact guard assist Goal: Pt. Will Perform Upper Body Dressing Flowsheets (Taken 05/19/2024 1443) Pt Will Perform Upper Body Dressing:  with set-up  with modified independence  sitting Goal: Pt. Will Perform Lower Body Dressing Flowsheets (Taken 05/19/2024 1443) Pt Will Perform Lower Body Dressing:  with mod assist  with adaptive equipment  sitting/lateral leans Goal: Pt. Will Transfer To Toilet Flowsheets (Taken 05/19/2024 1443) Pt Will Transfer to Toilet:  with supervision  with contact guard assist  stand pivot transfer  ambulating Goal: Pt. Will Perform Toileting-Clothing Manipulation Flowsheets (Taken 05/19/2024 1443) Pt Will Perform Toileting - Clothing Manipulation and hygiene:  with contact guard assist  sitting/lateral leans Goal: Pt/Caregiver Will Perform Home Exercise Program Flowsheets (Taken 05/19/2024 1443) Pt/caregiver will Perform Home Exercise Program:  Increased strength  Increased ROM  Both right and left upper extremity  Independently  Jaziah Goeller OT, MOT  "

## 2024-05-19 NOTE — Evaluation (Signed)
 Physical Therapy Evaluation Patient Details Name: [name] MRN: [id number] DOB: 03-16-1941 Today's Date: 05/19/2024  History of Present Illness  [name] is an 84 y.o. female with medical history significant of hypertension, hyperlipidemia, atrial fibrillation, GERD, CVA who presents to the emergency department via EMS from [facility] due to altered mental status.  At bedside, patient was unable to provide history, history was obtained from EDP.  Per report, she 2 days of onset of worsening mental status.  Patient has about 1 month of being wheelchair-bound due to increasing right foot pain (at baseline, she was able to ambulate with a walker)..  Patient has been seen by a podiatrist for right foot pain, but otherwise no surgical options for her.  She had Unna boot which was placed about a week ago.   Clinical Impression  Patient demonstrates slow labored movement for sitting up at bedside, very unsteady on feet using RW and limited to a few steps at bedside mostly due to weakness and right foot pain. Patient tolerated sitting up in chair after therapy nursing staff notified. Patient will benefit from continued skilled physical therapy in hospital and recommended venue below to increase strength, balance, endurance for safe ADLs and gait.           If plan is discharge home, recommend the following: A lot of help with bathing/dressing/bathroom;A lot of help with walking and/or transfers;Help with stairs or ramp for entrance;Assistance with cooking/housework   Can travel by private vehicle   No    Equipment Recommendations None recommended by PT  Recommendations for Other Services       Functional Status Assessment Patient has had a recent decline in their functional status and demonstrates the ability to make significant improvements in function in a reasonable and predictable amount of time.     Precautions / Restrictions Precautions Precautions: Fall Recall of  Precautions/Restrictions: Impaired Restrictions Weight Bearing Restrictions Per Provider Order: No      Mobility  Bed Mobility Overal bed mobility: Needs Assistance Bed Mobility: Supine to Sit     Supine to sit: Mod assist, HOB elevated     General bed mobility comments: increased time, labord movement, frequent verbal/tactile for rollowing directions    Transfers Overall transfer level: Needs assistance Equipment used: Rolling walker (2 wheels) Transfers: Sit to/from Stand, Bed to chair/wheelchair/BSC Sit to Stand: Mod assist   Step pivot transfers: Mod assist       General transfer comment: unsteady labored movement    Ambulation/Gait Ambulation/Gait assistance: Mod assist, Max assist Gait Distance (Feet): 5 Feet Assistive device: Rolling walker (2 wheels) Gait Pattern/deviations: Decreased step length - right, Decreased step length - left, Decreased stride length Gait velocity: slow     General Gait Details: limited to a few slow labored side steps before having to sit due to fatigue and right foot pain  Stairs            Wheelchair Mobility     Tilt Bed    Modified Rankin (Stroke Patients Only)       Balance Overall balance assessment: Needs assistance Sitting-balance support: Feet supported, No upper extremity supported Sitting balance-Leahy Scale: Fair Sitting balance - Comments: fair to good seated at EOB.   Standing balance support: Bilateral upper extremity supported, During functional activity, Reliant on assistive device for balance Standing balance-Leahy Scale: Poor Standing balance comment: using RW  Pertinent Vitals/Pain Pain Assessment Pain Assessment: No/denies pain    Home Living Family/patient expects to be discharged to:: Assisted living                 Home Equipment: [employer] (2 wheels);Wheelchair - manual      Prior Function Prior Level of Function : Needs  assist;Patient poor historian/Family not available       Physical Assist : Mobility (physical);ADLs (physical) Mobility (physical): Transfers;Gait;Stairs ADLs (physical): IADLs Mobility Comments: Pt reports ambulation with RW. Chart indicates pt has had to use a w/c the last month. ADLs Comments: Pt reports independence with ADL's. Pt is a poor historian. IADL's assisted by staff most likely.     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    Lower Extremity Assessment Lower Extremity Assessment: Generalized weakness    Cervical / Trunk Assessment Cervical / Trunk Assessment: Kyphotic  Communication   Communication Communication: Impaired Factors Affecting Communication: Hearing impaired    Cognition Arousal: Alert Behavior During Therapy: WFL for tasks assessed/performed                             Following commands: Intact       Cueing Cueing Techniques: Verbal cues, Tactile cues, Gestural cues     General Comments      Exercises     Assessment/Plan    PT Assessment Patient needs continued PT services  PT Problem List Decreased strength;Decreased activity tolerance;Decreased balance;Decreased mobility       PT Treatment Interventions DME instruction;Gait training;Stair training;Functional mobility training;Therapeutic activities;Therapeutic exercise;Balance training;Patient/family education    PT Goals (Current goals can be found in the Care Plan section)  Acute Rehab PT Goals Patient Stated Goal: return home with ALF staff to assist PT Goal Formulation: With patient Time For Goal Achievement: 06/02/24 Potential to Achieve Goals: Good    Frequency Min 3X/week     Co-evaluation PT/OT/SLP Co-Evaluation/Treatment: Yes Reason for Co-Treatment: To address functional/ADL transfers PT goals  addressed during session: Mobility/safety with mobility;Balance;Proper use of DME OT goals addressed during session: ADL's and self-care       AM-PAC PT 6 Clicks Mobility  Outcome Measure Help needed turning from your back to your side while in a flat bed without using bedrails?: A Lot Help needed moving from lying on your back to sitting on the side of a flat bed without using bedrails?: A Lot Help needed moving to and from a bed to a chair (including a wheelchair)?: A Lot Help needed standing up from a chair using your arms (e.g., wheelchair or bedside chair)?: A Lot Help needed to walk in hospital room?: A Lot Help needed climbing 3-5 steps with a railing? : Total 6 Click Score: 11    End of Session Equipment Utilized During Treatment: Oxygen Activity Tolerance: Patient tolerated treatment well;Patient limited by fatigue Patient left: in chair;with call bell/phone within reach Nurse Communication: Mobility status PT Visit Diagnosis: Unsteadiness on feet (R26.81);Other abnormalities of gait and mobility (R26.89);Muscle weakness (generalized) (M62.81)    Time: 8867-8847 PT Time Calculation (min) (ACUTE ONLY): 20 min   Charges:   PT Evaluation $PT Eval Moderate Complexity: 1 Mod PT Treatments $Therapeutic Activity: 8-22 mins PT General Charges $$ ACUTE PT VISIT: 1 Visit         3:41 PM, 05/19/24 Lynwood  Koleen, MPT Physical Therapist with [facility] 336 [phone] office [phone] mobile phone

## 2024-05-20 LAB — CBC WITH DIFFERENTIAL/PLATELET
Abs Immature Granulocytes: 0.06 10*3/uL (ref 0.00–0.07)
Basophils Absolute: 0 10*3/uL (ref 0.0–0.1)
Basophils Relative: 0 %
Eosinophils Absolute: 0.1 10*3/uL (ref 0.0–0.5)
Eosinophils Relative: 1 %
HCT: 32.2 % — ABNORMAL LOW (ref 36.0–46.0)
Hemoglobin: 10.5 g/dL — ABNORMAL LOW (ref 12.0–15.0)
Immature Granulocytes: 1 %
Lymphocytes Relative: 16 %
Lymphs Abs: 1.4 10*3/uL (ref 0.7–4.0)
MCH: 27.5 pg (ref 26.0–34.0)
MCHC: 32.6 g/dL (ref 30.0–36.0)
MCV: 84.3 fL (ref 80.0–100.0)
Monocytes Absolute: 1 10*3/uL (ref 0.1–1.0)
Monocytes Relative: 12 %
Neutro Abs: 6.4 10*3/uL (ref 1.7–7.7)
Neutrophils Relative %: 70 %
Platelets: 216 10*3/uL (ref 150–400)
RBC: 3.82 MIL/uL — ABNORMAL LOW (ref 3.87–5.11)
RDW: 17.4 % — ABNORMAL HIGH (ref 11.5–15.5)
WBC: 9 10*3/uL (ref 4.0–10.5)
nRBC: 0 % (ref 0.0–0.2)

## 2024-05-20 LAB — BLOOD CULTURE ID PANEL (REFLEXED) - BCID2

## 2024-05-20 LAB — BASIC METABOLIC PANEL WITH GFR
Anion gap: 14 (ref 5–15)
BUN: 14 mg/dL (ref 8–23)
CO2: 24 mmol/L (ref 22–32)
Calcium: 7.4 mg/dL — ABNORMAL LOW (ref 8.9–10.3)
Chloride: 94 mmol/L — ABNORMAL LOW (ref 98–111)
Creatinine, Ser: 0.85 mg/dL (ref 0.44–1.00)
GFR, Estimated: >60 mL/min
Glucose, Bld: 84 mg/dL (ref 70–99)
Potassium: 3.5 mmol/L (ref 3.5–5.1)
Sodium: 131 mmol/L — ABNORMAL LOW (ref 135–145)

## 2024-05-20 LAB — MAGNESIUM: Magnesium: 1.6 mg/dL — ABNORMAL LOW (ref 1.7–2.4)

## 2024-05-20 MED ORDER — ENSURE PLUS HIGH PROTEIN PO LIQD
237.0000 mL | Freq: Two times a day (BID) | ORAL | Status: DC
  Administered 2024-05-20 – 2024-05-25 (×12): 237 mL via ORAL

## 2024-05-20 NOTE — Progress Notes (Signed)
 " Progress Note   Patient: [name] FMW:[id number] DOB: July 08, 1940 DOA: 05/18/2024     0 DOS: the patient was seen and examined on 05/20/2024   Brief hospital course: [name] is an 84 y.o. female with medical history significant of hypertension, hyperlipidemia, atrial fibrillation, GERD, CVA who presents to the emergency department via EMS from [facility] due to altered mental status.  At bedside, patient was unable to provide history, history was obtained from EDP.  Per report, she had 2 days of onset of worsening mental status.  Patient has about 1 month of being wheelchair-bound due to increasing right foot pain (at baseline, she was able to ambulate with a walker).  Patient has been seen by a podiatrist for right foot pain, but otherwise no surgical options for her.  She had Unna boot which was placed about a week ago.   ED course In the emergency department, she was tachycardic, but otherwise respiratory normal rate.  Workup in the ED showed normal CBC except for WBC of 11.1 and normocytic anemia.  BMP was significant for sodium of 121, chloride 89, creatinine 1.01, magnesium  0.8, calcium  7.3 MRI head without contrast was suggestive of subacute small vessel infarct MRA of head showed no large vessel occlusion Neurologist on-call, Dr. Vanessa was consulted and recommended additional lab work including vitamin B1, B6 and B12 Electrolytes replenished, Percocet was given, and hydration was provided, admitted to [facility] for further management and stroke work up.  Assessment and plans: Subacute CVA CT head without contrast showed no acute intracranial abnormality MRI head without contrast was suggestive of subacute small vessel infarct MRA of head showed no large vessel occlusion. Neurologist consult over phone, she is already optimized on low-dose Eliquis  per EDP. Continue Eliquis , Lipitor. PT/ OT advised HHPT.   Acute metabolic encephalopathy possibly secondary to above MRI  findings above would not explain patient's confusion per neurologist Vitamin B1, B6 and B12 levels were recommended Continue fall precaution, delirium precaution B12 573, TSH 2.860   Hypomagnesemia Replete as needed   Hyponatremia possibly secondary to dehydration Na 131, this may be due to overdiuresis due to recent increased Lasix  to 60 mg twice daily per EDP Continue gentle hydration and monitor sodium level.   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 Continue fall precaution.  Continue PT/OT.   Acquired hypothyroidism Continue Synthroid    Atrial fibrillation with controlled ventricular rate  Continue Eliquis , Lopressor    Essential hypertension Continue Lopressor    Mixed hyperlipidemia Continue Lipitor   GERD Continue PPI   Hypocalcemia Continue calcium  carbonate     Out of bed to chair. Incentive spirometry. Nursing supportive care. Fall, aspiration precautions. Diet:  Diet Orders (From admission, onward)     Start     Ordered   05/19/24 0130  Diet Heart Room service appropriate? Yes; Fluid consistency: Thin  Diet effective now       Question Answer Comment  Room service appropriate? Yes   Fluid consistency: Thin      05/19/24 0130           DVT prophylaxis: SCDs Start: 05/19/24 0129 apixaban  (ELIQUIS ) tablet 2.5 mg Start: 05/18/24 2200 apixaban  (ELIQUIS ) tablet 2.5 mg  Level of care: Telemetry   Code Status: Full Code  Subjective: Patient is seen and examined today morning. She is sitting in chair. Seems confused, has hard of hearing.  Physical Exam:  Vitals:   05/19/24 2138 05/20/24 0148 05/20/24 0853 05/20/24 1300  BP: 106/70 110/70 111/70 107/66  Pulse: 87   (!) 58  Resp: 18  18   Temp: 97.8 F (36.6 C) 97.6 F (36.4 C)  97.8 F (36.6 C)  TempSrc: Oral Oral  Oral  SpO2: 94% 93% 94% 96%  Weight:       Height:        General - Elderly Caucasian thin built female, no apparent distress HEENT - PERRLA, EOMI, atraumatic head, non tender sinuses. Lung - Clear, no rales, rhonchi, wheezes. Heart - S1, S2 heard, no murmurs, rubs, no pedal edema. Abdomen - Soft, non tender, bowel sounds good Neuro - Alert, awake and confused, non focal exam. Skin - Warm and dry.  Data Reviewed:      Latest Ref Rng & Units 05/20/2024    4:47 AM 05/19/2024    3:25 AM 05/18/2024    3:46 PM  CBC  WBC 4.0 - 10.5 K/uL 9.0  9.1  11.1   Hemoglobin 12.0 - 15.0 g/dL 89.4  88.4  88.4   Hematocrit 36.0 - 46.0 % 32.2  36.0  36.0   Platelets 150 - 400 K/uL 216  203  254       Latest Ref Rng & Units 05/20/2024    4:47 AM 05/19/2024    3:25 AM 05/18/2024    3:46 PM  BMP  Glucose 70 - 99 mg/dL 84  87  91   BUN 8 - 23 mg/dL 14  18  23    Creatinine 0.44 - 1.00 mg/dL 9.14  9.17  8.98   Sodium 135 - 145 mmol/L 131  132  131   Potassium 3.5 - 5.1 mmol/L 3.5  3.4  3.8   Chloride 98 - 111 mmol/L 94  92  89   CO2 22 - 32 mmol/L 24  24  24    Calcium  8.9 - 10.3 mg/dL 7.4  7.2  7.3    CT Angio Aortobifemoral W and/or Wo Contrast Result Date: 05/18/2024 EXAM: CTA ABDOMEN AND PELVIS WITH AND WITHOUT CONTRAST AND RUNOFF CTA OF THE LOWER EXTREMITIES WITH CONTRAST 05/18/2024 06:33:57 PM TECHNIQUE: CTA images of the abdomen, pelvis and lower extremities with and without intravenous contrast. Three-dimensional MIP/volume rendered formations were performed. Automated exposure control, iterative reconstruction, and/or weight based adjustment of the mA/kV was utilized to reduce the radiation dose to as low as reasonably achievable. 125 mL of iohexol  (OMNIPAQUE ) 350 MG/ML injection was administered. COMPARISON: CT abdomen and pelvis 10/25/2023. CLINICAL HISTORY: Claudication or leg ischemia. Right foot pain. Altered mental status. FINDINGS: LIMITATIONS/ARTIFACTS: Motion artifact limits the examination. VASCULATURE: Diffuse calcification  throughout the visualized vessels. AORTA: Diffuse calcification of the aorta. No aortic aneurysm or dissection. No focal stenosis. CELIAC TRUNK: Patent. No occlusion or significant stenosis. SUPERIOR MESENTERIC ARTERY: Patent. No occlusion or significant stenosis. INFERIOR MESENTERIC ARTERY: Patent. No occlusion or significant stenosis. RENAL ARTERIES: Bilateral renal arteries are patent. No occlusion or significant stenosis. RIGHT ILIAC ARTERIES: Bilateral common iliac arteries, bilateral internal and external iliac arteries are patent. No occlusion or significant stenosis. RIGHT FEMORAL ARTERIES: Right common femoral, superficial femoral, and deep femoral arteries are patent with diffuse calcification throughout. No occlusion or significant stenosis. RIGHT POPLITEAL ARTERY: Right popliteal artery is patent with diffuse calcification throughout. No occlusion or significant stenosis. RIGHT CALF ARTERIES: 3 vessel runoff to the right ankle is demonstrated, although severe atherosclerotic changes are present throughout the calf vessels. LEFT ILIAC ARTERIES: Bilateral common  iliac arteries, bilateral internal and external iliac arteries are patent. No occlusion or significant stenosis. LEFT FEMORAL ARTERIES: Left common femoral, superficial femoral, and popliteal arteries are patent although there is diffuse calcification throughout. No occlusion or significant stenosis. LEFT POPLITEAL ARTERY: Left popliteal artery is patent although there is diffuse calcification throughout. No occlusion or significant stenosis. LEFT CALF ARTERIES: 3 vessel runoff to the left ankle with diffusely diseased vessels. ABDOMEN AND PELVIS: LOWER CHEST: Linear scarring or atelectasis in the lung bases. Trace left pleural effusion. Cardiac enlargement with severe right heart enlargement. LIVER: The liver is unremarkable. GALLBLADDER AND BILE DUCTS: Gallbladder is unremarkable. No biliary ductal dilatation. SPLEEN: The spleen is unremarkable.  PANCREAS: The pancreas is unremarkable. ADRENAL GLANDS: Bilateral adrenal glands demonstrate no acute abnormality. KIDNEYS, URETERS AND BLADDER: The kidneys are unremarkable. No stones in the kidneys or ureters. No hydronephrosis. No evidence of perinephric or periureteral stranding. The bladder is distended but appears otherwise normal. GI AND Bowel: Stomach and duodenal sweep demonstrate no acute abnormality. There is no bowel obstruction. No abnormal bowel wall thickening or distension. REPRODUCTIVE: Reproductive organs are unremarkable. PERITONEUM AND RETROPERITONEUM: No ascites or free air. LYMPH NODES: No significant lymphadenopathy. BONES AND SOFT TISSUES: Degenerative changes in the spine, hips, and knees. No acute bony abnormalities. Postoperative changes in the left ankle. Mild edema in the subcutaneous fat over the calf regions, greater on the left. IMPRESSION: 1. No occlusion or hemodynamically significant stenosis of the arterial system of the abdomen, pelvis, with diffuse severe vascular calcifications throughout. 2. Severe atherosclerotic changes throughout the runoff vessels bilaterally consistent with diffuse disease although 3 vessel flow is seen bilaterally. Electronically signed by: Elsie Gravely MD 05/18/2024 06:50 PM EST RP Workstation: [id number]   MR BRAIN WO CONTRAST Result Date: 05/18/2024 CLINICAL DATA:  Initial evaluation for acute mental status change, unknown cause. EXAM: MRI HEAD WITHOUT CONTRAST MRA HEAD WITHOUT CONTRAST TECHNIQUE: Multiplanar, multi-echo pulse sequences of the brain and surrounding structures were acquired without intravenous contrast. Angiographic images of the Circle of Willis were acquired using MRA technique without intravenous contrast. COMPARISON:  CT from earlier the same day. FINDINGS: MRI HEAD FINDINGS Brain: Examination severely degraded by motion artifact, limiting assessment. Generalized age-related cerebral volume loss. Patchy and confluent T2/FLAIR  hyperintensity involving the periventricular and deep white matter both cerebral hemispheres, most likely related chronic microvascular ischemic disease, moderately advanced in nature. Encephalomalacia and gliosis involving the left parotid temporal and occipital region, likely related to prior infarct. Mild chronic hemosiderin staining in this region. Single 5 mm focus of mild diffusion signal abnormality seen involving the deep white matter of the posterior left centrum semi ovale (series 5, image 36). No visible associated ADC correlate. Finding likely reflects a small evolving subacute small vessel infarct. No visible associated hemorrhage or mass effect. No other evidence for acute or subacute ischemia. Gray-white matter differentiation otherwise maintained. No mass lesion or midline shift. Ex vacuo dilatation of the left lateral ventricle related to chronic left cerebral encephalomalacia without hydrocephalus. Small holo hemispheric bilateral subdural collections measuring up to 2 mm, slightly greater on the left (series 11, image 15). 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 Pituitary gland within normal limits. Vascular: Major intracranial vascular flow voids are grossly maintained at the skull base. Skull and upper cervical spine: Cranial junction within normal limits. Bone marrow signal intensity grossly normal. No scalp soft tissue abnormality. Sinuses/Orbits: Prior bilateral ocular lens replacement. Chronic left frontoethmoidal  and maxillary sinusitis, left OM U obstructive pattern. No significant mastoid effusion. Other: None. MRA HEAD FINDINGS Anterior circulation: Examination severely degraded by motion artifact. The 3D MIP reconstructions are markedly limited as a result. Both internal carotid arteries are patent through the siphons to the termini. Evaluation for possible stenosis or other abnormality fairly limited on this  exam. A1 segments patent bilaterally. Grossly normal anterior communicating artery complex. Anterior cerebral arteries grossly patent to their distal aspects. No visible M1 stenosis or occlusion. No visible proximal MCA branch occlusion. Distal MCA branches grossly perfused bilaterally on this motion degraded exam. Posterior circulation: Visualized distal V4 segments patent without visible stenosis. Neither PICA well visualized. Basilar patent without visible stenosis. Superior cerebellar arteries grossly patent at their origins. Both PCAs grossly patent bilaterally. Anatomic variants: None significant. No obvious aneurysm or other vascular malformation seen on this motion degraded exam. IMPRESSION: MRI HEAD: 1. Severely motion degraded exam. 2. 5 mm focus of diffusion signal abnormality involving the deep white matter of the posterior left centrum semi ovale, likely an evolving subacute small vessel infarct. No associated hemorrhage or mass effect. 3. Small holo hemispheric bilateral subdural collections measuring up to 2 mm, slightly greater on the left. 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 No significant mass effect. 4. Underlying age-related cerebral volume loss with moderately advanced chronic microvascular ischemic disease. Chronic left temporoccipital infarct. 5. Chronic left frontoethmoidal and maxillary sinusitis, left OMU obstructive pattern. MRA HEAD: 1. Severely motion degraded exam. 2. Grossly negative intracranial MRA for large vessel occlusion. No other definite acute vascular abnormality. Electronically Signed   By: Morene Hoard M.D.   On: 05/18/2024 17:41   MR ANGIO HEAD WO CONTRAST Result Date: 05/18/2024 CLINICAL DATA:  Initial evaluation for acute mental status change, unknown cause. EXAM: MRI HEAD WITHOUT CONTRAST MRA HEAD WITHOUT CONTRAST TECHNIQUE: Multiplanar, multi-echo pulse sequences of the brain and surrounding structures were  acquired without intravenous contrast. Angiographic images of the Circle of Willis were acquired using MRA technique without intravenous contrast. COMPARISON:  CT from earlier the same day. FINDINGS: MRI HEAD FINDINGS Brain: Examination severely degraded by motion artifact, limiting assessment. Generalized age-related cerebral volume loss. Patchy and confluent T2/FLAIR hyperintensity involving the periventricular and deep white matter both cerebral hemispheres, most likely related chronic microvascular ischemic disease, moderately advanced in nature. Encephalomalacia and gliosis involving the left parotid temporal and occipital region, likely related to prior infarct. Mild chronic hemosiderin staining in this region. Single 5 mm focus of mild diffusion signal abnormality seen involving the deep white matter of the posterior left centrum semi ovale (series 5, image 36). No visible associated ADC correlate. Finding likely reflects a small evolving subacute small vessel infarct. No visible associated hemorrhage or mass effect. No other evidence for acute or subacute ischemia. Gray-white matter differentiation otherwise maintained. No mass lesion or midline shift. Ex vacuo dilatation of the left lateral ventricle related to chronic left cerebral encephalomalacia without hydrocephalus. Small holo hemispheric bilateral subdural collections measuring up to 2 mm, slightly greater on the left (series 11, image 15). 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 Pituitary gland within normal limits. Vascular: Major intracranial vascular flow voids are grossly maintained at the skull base. Skull and upper cervical spine: Cranial junction within normal limits. Bone marrow signal intensity grossly normal. No scalp soft tissue abnormality. Sinuses/Orbits: Prior bilateral ocular lens replacement. Chronic left frontoethmoidal and maxillary sinusitis, left OM U  obstructive  pattern. No significant mastoid effusion. Other: None. MRA HEAD FINDINGS Anterior circulation: Examination severely degraded by motion artifact. The 3D MIP reconstructions are markedly limited as a result. Both internal carotid arteries are patent through the siphons to the termini. Evaluation for possible stenosis or other abnormality fairly limited on this exam. A1 segments patent bilaterally. Grossly normal anterior communicating artery complex. Anterior cerebral arteries grossly patent to their distal aspects. No visible M1 stenosis or occlusion. No visible proximal MCA branch occlusion. Distal MCA branches grossly perfused bilaterally on this motion degraded exam. Posterior circulation: Visualized distal V4 segments patent without visible stenosis. Neither PICA well visualized. Basilar patent without visible stenosis. Superior cerebellar arteries grossly patent at their origins. Both PCAs grossly patent bilaterally. Anatomic variants: None significant. No obvious aneurysm or other vascular malformation seen on this motion degraded exam. IMPRESSION: MRI HEAD: 1. Severely motion degraded exam. 2. 5 mm focus of diffusion signal abnormality involving the deep white matter of the posterior left centrum semi ovale, likely an evolving subacute small vessel infarct. No associated hemorrhage or mass effect. 3. Small holo hemispheric bilateral subdural collections measuring up to 2 mm, slightly greater on the left. 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 No significant mass effect. 4. Underlying age-related cerebral volume loss with moderately advanced chronic microvascular ischemic disease. Chronic left temporoccipital infarct. 5. Chronic left frontoethmoidal and maxillary sinusitis, left OMU obstructive pattern. MRA HEAD: 1. Severely motion degraded exam. 2. Grossly negative intracranial MRA for large vessel occlusion. No other definite acute vascular abnormality.  Electronically Signed   By: Morene Hoard M.D.   On: 05/18/2024 17:41    Family Communication: no family at bedside.  Disposition: Status is: Observation The patient remains OBS appropriate and will d/c before 2 midnights.  Planned Discharge Destination: Home with Home Health     Time spent: 42 minutes  Author: Concepcion Riser, MD 05/20/2024 4:12 PM Secure chat 7am to 7pm For on call review [email or web address]    "

## 2024-05-20 NOTE — Plan of Care (Signed)

## 2024-05-20 NOTE — Care Management Obs Status (Signed)
 MEDICARE OBSERVATION STATUS NOTIFICATION   Patient Details  Name: [name] MRN: [id number] Date of Birth: 1941/01/26   Medicare Observation Status Notification Given:  Yes    Duwaine LITTIE Ada 05/20/2024, 8:36 AM

## 2024-05-20 NOTE — Progress Notes (Signed)
 JINNY Kipper NP aware. Pharmacy to adjust abx per NP   05/19/24 2317  Provider Notification  Provider Name/Title J. Kipper NP  Date Provider Notified 05/19/24  Time Provider Notified 2317  Method of Notification  [facility])  Notification Reason Critical Result (Blood culture gram + cocci anaerobic 1/4 bottles)  Provider response Other (Comment)  Date of Provider Response 05/19/24  Time of Provider Response [phone]

## 2024-05-20 NOTE — Progress Notes (Addendum)
 Physical Therapy Treatment Patient Details Name: [name] MRN: [id number] DOB: Sep 05, 1940 Today's Date: 05/20/2024   History of Present Illness [name] is an 84 y.o. female with medical history significant of hypertension, hyperlipidemia, atrial fibrillation, GERD, CVA who presents to the emergency department via EMS from [facility] due to altered mental status.  At bedside, patient was unable to provide history, history was obtained from EDP.  Per report, she 2 days of onset of worsening mental status.  Patient has about 1 month of being wheelchair-bound due to increasing right foot pain (at baseline, she was able to ambulate with a walker)..  Patient has been seen by a podiatrist for right foot pain, but otherwise no surgical options for her.  She had Unna boot which was placed about a week ago.    PT Comments  Patient was agreeable to PT treatment session. Patient was received supine in bed eating breakfast. She required mod assist to get into sitting this date. 2 STS completed during session with RW and mod assist due to RLE pain and general weakness. Pt tolerates standing at least 5 minutes following second stand, long enough for this PT to provide total assist with pericare in standing. Patient is able to take a few side steps to chair but with increased discomfort in RLE. Pt tolerates sitting in chair at EOS, breakfast set up, alarm set and call button in reach. Patient will benefit from continued skilled physical therapy acutely and in recommended venue in order to address current deficits and improve overall function.     If plan is discharge home, recommend the following: A lot of help with bathing/dressing/bathroom;A lot of help with walking and/or transfers;Help with stairs or ramp for entrance;Assistance with cooking/housework   Can travel by private vehicle        Equipment Recommendations  None recommended by PT    Recommendations for Other Services        Precautions / Restrictions Precautions Precautions: Fall Recall of Precautions/Restrictions: Impaired Restrictions Weight Bearing Restrictions Per Provider Order: No     Mobility  Bed Mobility Overal bed mobility: Needs Assistance Bed Mobility: Supine to Sit     Supine to sit: Mod assist     General bed mobility comments: pt demo slow labored movement, HOB slightly elevated, can initate transfer with verbal cueing, pt assists LE towards EOB using UE support due to LE weakness, required mod assist to complete transfer and scoot bottom to EOB    Transfers Overall transfer level: Needs assistance Equipment used: Rolling walker (2 wheels) Transfers: Sit to/from Stand, Bed to chair/wheelchair/BSC Sit to Stand: Mod assist   Step pivot transfers: Mod assist, Min assist       General transfer comment: 2 STS from bed with mod assist and RW use, pt reporting inc pain in RLE with weight bearing, unsteady and labored movement during transfer    Ambulation/Gait Ambulation/Gait assistance: Mod assist, Min assist Gait Distance (Feet): 3 Feet Assistive device: Rolling walker (2 wheels) Gait Pattern/deviations: Decreased step length - right, Decreased step length - left, Decreased stride length Gait velocity: slow     General Gait Details: pt limited to a few side steps at bedside with RW and min/mod assist for steadying at times, pt limited due to pain and fatigue   Stairs             Wheelchair Mobility     Tilt Bed    Modified Rankin (Stroke Patients Only)  Balance Overall balance assessment: Needs assistance Sitting-balance support: Feet supported, Bilateral upper extremity supported, No upper extremity supported Sitting balance-Leahy Scale: Good Sitting balance - Comments: good seated at EOB.   Standing balance support: Bilateral upper extremity supported, During functional activity, Reliant on assistive device for balance Standing balance-Leahy Scale:  Poor Standing balance comment: poor to fair using RW            Communication Communication Communication: Other (comment) (pt very HOH) Factors Affecting Communication: Hearing impaired  Cognition Arousal: Alert Behavior During Therapy: WFL for tasks assessed/performed     Following commands: Intact      Cueing Cueing Techniques: Verbal cues, Tactile cues, Gestural cues, Visual cues  Exercises      General Comments        Pertinent Vitals/Pain Pain Assessment Pain Assessment: No/denies pain    Home Living                          Prior Function            PT Goals (current goals can now be found in the care plan section) Acute Rehab PT Goals Patient Stated Goal: return home with ALF staff to assist PT Goal Formulation: With patient Time For Goal Achievement: 06/02/24 Potential to Achieve Goals: Good Progress towards PT goals: Progressing toward goals    Frequency    Min 3X/week      PT Plan      Co-evaluation              AM-PAC PT 6 Clicks Mobility   Outcome Measure  Help needed turning from your back to your side while in a flat bed without using bedrails?: A Little Help needed moving from lying on your back to sitting on the side of a flat bed without using bedrails?: A Lot Help needed moving to and from a bed to a chair (including a wheelchair)?: A Little Help needed standing up from a chair using your arms (e.g., wheelchair or bedside chair)?: A Lot Help needed to walk in hospital room?: A Lot Help needed climbing 3-5 steps with a railing? : A Lot 6 Click Score: 14    End of Session Equipment Utilized During Treatment: Oxygen;Gait belt Activity Tolerance: Patient tolerated treatment well;Patient limited by fatigue;Patient limited by pain Patient left: in chair;with call bell/phone within reach;with chair alarm set Nurse Communication: Mobility status PT Visit Diagnosis: Unsteadiness on feet (R26.81);Other abnormalities  of gait and mobility (R26.89);Muscle weakness (generalized) (M62.81)     Time: 9099-9076 PT Time Calculation (min) (ACUTE ONLY): 23 min  Charges:    $Therapeutic Activity: 23-37 mins PT General Charges $$ ACUTE PT VISIT: 1 Visit                     3:37 PM, 05/20/24 [name], PT, DPT [place] with [facility]

## 2024-05-21 DIAGNOSIS — I10 Essential (primary) hypertension: Secondary | ICD-10-CM

## 2024-05-21 DIAGNOSIS — R7881 Bacteremia: Secondary | ICD-10-CM | POA: Diagnosis not present

## 2024-05-21 DIAGNOSIS — B955 Unspecified streptococcus as the cause of diseases classified elsewhere: Secondary | ICD-10-CM

## 2024-05-21 DIAGNOSIS — I639 Cerebral infarction, unspecified: Secondary | ICD-10-CM | POA: Diagnosis not present

## 2024-05-21 DIAGNOSIS — G9341 Metabolic encephalopathy: Secondary | ICD-10-CM | POA: Diagnosis not present

## 2024-05-21 MED ORDER — FUROSEMIDE 20 MG PO TABS: 20.0000 mg | ORAL_TABLET | Freq: Every day | ORAL | Status: DC

## 2024-05-21 MED ORDER — METOPROLOL TARTRATE 25 MG PO TABS: 12.5000 mg | ORAL_TABLET | Freq: Two times a day (BID) | ORAL | Status: DC

## 2024-05-21 MED ORDER — SODIUM CHLORIDE 0.9 % IV SOLN
2.0000 g | INTRAVENOUS | Status: DC
  Administered 2024-05-21 – 2024-05-25 (×5): 2 g via INTRAVENOUS
  Filled 2024-05-21 (×5): qty 20

## 2024-05-21 MED ORDER — MAGNESIUM OXIDE -MG SUPPLEMENT 400 (240 MG) MG PO TABS
400.0000 mg | ORAL_TABLET | Freq: Every day | ORAL | Status: DC
  Administered 2024-05-21 – 2024-05-26 (×6): 400 mg via ORAL
  Filled 2024-05-21 (×6): qty 1

## 2024-05-21 NOTE — Progress Notes (Addendum)
 Mobility Specialist Progress Note:    05/21/24 1010  Mobility  Activity Pivoted/transferred from bed to chair  Level of Assistance Moderate assist, patient does 50-74%  Assistive Device Front wheel walker  Distance Ambulated (ft) 3 ft  Range of Motion/Exercises Active;All extremities  Activity Response Tolerated well  Mobility Referral Yes  Mobility visit 1 Mobility  Mobility Specialist Start Time (ACUTE ONLY) 1010  Mobility Specialist Stop Time (ACUTE ONLY) 1030  Mobility Specialist Time Calculation (min) (ACUTE ONLY) 20 min   Pt received in bed, agreeable to mobility. Required ModA to stand and transfer with RW. Tolerated well, required verbal cues during session d/t confusion. Pt also stated she was having some LLE pain. Alarm on, call bell in reach. All needs met.   Cris Talavera Mobility Specialist Please contact via Special Educational Needs Teacher or  Rehab office at [phone]

## 2024-05-21 NOTE — Plan of Care (Signed)

## 2024-05-21 NOTE — Progress Notes (Signed)
 Occupational Therapy Treatment Patient Details Name: [name] MRN: [id number] DOB: 28-Jul-1940 Today's Date: 05/21/2024   History of present illness [name] is an 84 y.o. female with medical history significant of hypertension, hyperlipidemia, atrial fibrillation, GERD, CVA who presents to the emergency department via EMS from [facility] due to altered mental status.  At bedside, patient was unable to provide history, history was obtained from EDP.  Per report, she 2 days of onset of worsening mental status.  Patient has about 1 month of being wheelchair-bound due to increasing right foot pain (at baseline, she was able to ambulate with a walker)..  Patient has been seen by a podiatrist for right foot pain, but otherwise no surgical options for her.  She had Unna boot which was placed about a week ago.   OT comments  Pt pleasantly confused on OT arrival. Agreeable to exercises completed seated in recliner. Pt complete 1 sit to stand with mod A due to poor positioning and R side lean. All needs met and pt continues to benefit from skilled OT services.       If plan is discharge home, recommend the following:  A lot of help with walking and/or transfers;A lot of help with bathing/dressing/bathroom;Assistance with cooking/housework;Direct supervision/assist for medications management;Assist for transportation;Help with stairs or ramp for entrance   Equipment Recommendations  None recommended by OT    Recommendations for Other Services      Precautions / Restrictions Precautions Precautions: Fall Recall of Precautions/Restrictions: Impaired Restrictions Weight Bearing Restrictions Per Provider Order: No       Mobility Bed Mobility                    Transfers                   General transfer comment: Mod A to power up and reposition,     Balance   Sitting-balance support: Feet supported, Bilateral upper extremity supported Sitting balance-Leahy  Scale: Fair     Standing balance support: Bilateral upper extremity supported, Reliant on assistive device for balance Standing balance-Leahy Scale: Poor Standing balance comment: relianat on RW                           ADL either performed or assessed with clinical judgement   ADL Overall ADL's : Needs assistance/impaired                                            Extremity/Trunk Assessment Upper Extremity Assessment Upper Extremity Assessment: Generalized weakness   Lower Extremity Assessment Lower Extremity Assessment: Generalized weakness        Vision   Vision Assessment?: No apparent visual deficits   Perception     Praxis     Communication Communication Communication: Other (comment) Factors Affecting Communication: Hearing impaired   Cognition Arousal: Alert Behavior During Therapy: WFL for tasks assessed/performed Cognition: Cognition impaired   Orientation impairments: Person Awareness: Intellectual awareness impaired Memory impairment (select all impairments): Short-term memory Attention impairment (select first level of impairment): Alternating attention Executive functioning impairment (select all impairments): Organization, Sequencing, Problem solving OT - Cognition Comments: Unsure pt baseline, difficulty with word finding and not oriented besides to self.                 Following commands: Intact  Cueing   Cueing Techniques: Verbal cues, Tactile cues, Gestural cues, Visual cues  Exercises Exercises: General Upper Extremity General Exercises - Upper Extremity Shoulder Flexion: AROM, Both, 10 reps Shoulder ABduction: AROM, Both, 10 reps Shoulder Horizontal ABduction: AROM, Both, 10 reps Elbow Flexion: AROM, Both, 10 reps Digit Composite Flexion: AROM, Both, 10 reps    Shoulder Instructions       General Comments      Pertinent Vitals/ Pain       Pain Assessment Pain Assessment: No/denies  pain  Home Living                                          Prior Functioning/Environment              Frequency  Min 2X/week        Progress Toward Goals  OT Goals(current goals can now be found in the care plan section)  Progress towards OT goals: Progressing toward goals  Acute Rehab OT Goals Patient Stated Goal: None stated this session OT Goal Formulation: With patient Time For Goal Achievement: 06/02/24 Potential to Achieve Goals: Fair ADL Goals Pt Will Perform Grooming: with supervision;standing;with contact guard assist Pt Will Perform Upper Body Dressing: with set-up;with modified independence;sitting Pt Will Perform Lower Body Dressing: with mod assist;with adaptive equipment;sitting/lateral leans Pt Will Transfer to Toilet: with supervision;with contact guard assist;stand pivot transfer;ambulating Pt Will Perform Toileting - Clothing Manipulation and hygiene: with contact guard assist;sitting/lateral leans Pt/caregiver will Perform Home Exercise Program: Increased strength;Increased ROM;Both right and left upper extremity;Independently  Plan      Co-evaluation                 AM-PAC OT 6 Clicks Daily Activity     Outcome Measure   Help from another person eating meals?: A Little Help from another person taking care of personal grooming?: A Little Help from another person toileting, which includes using toliet, bedpan, or urinal?: A Lot Help from another person bathing (including washing, rinsing, drying)?: A Lot Help from another person to put on and taking off regular upper body clothing?: A Little Help from another person to put on and taking off regular lower body clothing?: A Lot 6 Click Score: 15    End of Session    OT Visit Diagnosis: Unsteadiness on feet (R26.81);Other abnormalities of gait and mobility (R26.89);Muscle weakness (generalized) (M62.81);Other symptoms and signs involving cognitive function   Activity  Tolerance Patient tolerated treatment well   Patient Left in chair;with call bell/phone within reach;with chair alarm set   Nurse Communication Mobility status        Time: 8446-8395 OT Time Calculation (min): 11 min  Charges: OT General Charges $OT Visit: 1 Visit OT Treatments $Therapeutic Exercise: 8-22 mins  Valentin Nightingale, OTR/L [facility] Acute Rehab  Shakesha Soltau Elane Nightingale 05/21/2024, 4:45 PM

## 2024-05-21 NOTE — Progress Notes (Signed)
 " Progress Note   Patient: [name] FMW:[id number] DOB: October 16, 1940 DOA: 05/18/2024     0 DOS: the patient was seen and examined on 05/21/2024   Brief hospital course: [name] is an 84 y.o. female with medical history significant of hypertension, hyperlipidemia, atrial fibrillation, GERD, CVA who presents to the emergency department via EMS from [facility] due to altered mental status.  At bedside, patient was unable to provide history, history was obtained from EDP.  Per report, she had 2 days of onset of worsening mental status.  Patient has about 1 month of being wheelchair-bound due to increasing right foot pain (at baseline, she was able to ambulate with a walker).  Patient has been seen by a podiatrist for right foot pain, but otherwise no surgical options for her.  She had Unna boot which was placed about a week ago.   ED course In the emergency department, she was tachycardic, but otherwise respiratory normal rate.  Workup in the ED showed normal CBC except for WBC of 11.1 and normocytic anemia.  BMP was significant for sodium of 121, chloride 89, creatinine 1.01, magnesium  0.8, calcium  7.3 MRI head without contrast was suggestive of subacute small vessel infarct MRA of head showed no large vessel occlusion Neurologist on-call, Dr. Vanessa was consulted and recommended additional lab work including vitamin B1, B6 and B12 Electrolytes replenished, Percocet was given, and hydration was provided, admitted to [facility] for further management and stroke work up.  Assessment and plans: Subacute CVA CT head without contrast showed no acute intracranial abnormality MRI head without contrast was suggestive of subacute small vessel infarct MRA of head showed no large vessel occlusion. Neurologist consult over phone, she is already optimized on low-dose Eliquis  per EDP. Continue Eliquis , Lipitor. PT/ OT advised HHPT.   Acute metabolic encephalopathy Strep Bacteremia Blood cultures  positive for strep gordonii, follow susceptibilities. Check Echo. MRI findings above would not explain patient's confusion per neurologist Continue fall precaution, delirium precaution   Hypomagnesemia Daily repletion ordered.   Hyponatremia possibly secondary to dehydration Na 131, stable. Encourage oral diet, hydration.   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 Continue fall precaution.  Continue PT/OT.   Acquired hypothyroidism Continue Synthroid    Atrial fibrillation with controlled ventricular rate  Continue Eliquis , Lopressor    Essential hypertension Continue Lopressor    Mixed hyperlipidemia Continue Lipitor   GERD Continue PPI   Hypocalcemia Continue calcium  carbonate     Out of bed to chair. Incentive spirometry. Nursing supportive care. Fall, aspiration precautions. Diet:  Diet Orders (From admission, onward)     Start     Ordered   05/19/24 0130  Diet Heart Room service appropriate? Yes; Fluid consistency: Thin  Diet effective now       Question Answer Comment  Room service appropriate? Yes   Fluid consistency: Thin      05/19/24 0130           DVT prophylaxis: SCDs Start: 05/19/24 0129 apixaban  (ELIQUIS ) tablet 2.5 mg Start: 05/18/24 2200 apixaban  (ELIQUIS ) tablet 2.5 mg  Level of care: Telemetry   Code Status: Full Code  Subjective: Patient is seen and examined today morning. She is lying in bed. Denies any complaints.Seems baseline confused, has hard of hearing.  Physical Exam: Vitals:   05/21/24 0400 05/21/24 0423 05/21/24 0918 05/21/24 1320  BP:  106/61 (!) 93/53 113/64  Pulse:  97 73 76  Resp: 18 18    Temp:  (!) 97.5 F (36.4 C)  98.2 F (36.8 C)  TempSrc:  Oral  Oral  SpO2:  93%  94%  Weight:      Height:        General - Elderly Caucasian thin built female, no apparent  distress HEENT - PERRLA, EOMI, atraumatic head, non tender sinuses. Lung - Clear, no rales, rhonchi, wheezes. Heart - S1, S2 heard, no murmurs, rubs, no pedal edema. Abdomen - Soft, non tender, bowel sounds good Neuro - Alert, awake and confused, non focal exam. Skin - Warm and dry.  Data Reviewed:      Latest Ref Rng & Units 05/20/2024    4:47 AM 05/19/2024    3:25 AM 05/18/2024    3:46 PM  CBC  WBC 4.0 - 10.5 K/uL 9.0  9.1  11.1   Hemoglobin 12.0 - 15.0 g/dL 89.4  88.4  88.4   Hematocrit 36.0 - 46.0 % 32.2  36.0  36.0   Platelets 150 - 400 K/uL 216  203  254       Latest Ref Rng & Units 05/20/2024    4:47 AM 05/19/2024    3:25 AM 05/18/2024    3:46 PM  BMP  Glucose 70 - 99 mg/dL 84  87  91   BUN 8 - 23 mg/dL 14  18  23    Creatinine 0.44 - 1.00 mg/dL 9.14  9.17  8.98   Sodium 135 - 145 mmol/L 131  132  131   Potassium 3.5 - 5.1 mmol/L 3.5  3.4  3.8   Chloride 98 - 111 mmol/L 94  92  89   CO2 22 - 32 mmol/L 24  24  24    Calcium  8.9 - 10.3 mg/dL 7.4  7.2  7.3    No results found.   Family Communication: no family at bedside.  Disposition: Status is: Observation The patient will require care spanning > 2 midnights and should be moved to inpatient because: positive blood cultures, echo.  Planned Discharge Destination: Home with Home Health     Time spent: 44 minutes  Author: Concepcion Riser, MD 05/21/2024 3:56 PM Secure chat 7am to 7pm For on call review [email or web address].    "

## 2024-05-21 NOTE — Progress Notes (Addendum)
 Physical Therapy Treatment Patient Details Name: [name] MRN: [id number] DOB: 06/05/1940 Today's Date: 05/21/2024   History of Present Illness [name] is an 84 y.o. female with medical history significant of hypertension, hyperlipidemia, atrial fibrillation, GERD, CVA who presents to the emergency department via EMS from [facility] due to altered mental status.  At bedside, patient was unable to provide history, history was obtained from EDP.  Per report, she 2 days of onset of worsening mental status.  Patient has about 1 month of being wheelchair-bound due to increasing right foot pain (at baseline, she was able to ambulate with a walker)..  Patient has been seen by a podiatrist for right foot pain, but otherwise no surgical options for her.  She had Unna boot which was placed about a week ago.    PT Comments  Pt sitting in chair and willing to participate.  Pt limited by [facility] and increased difficutly with PTA wearing mask this session with increased multimodal cueing to improve communication (demonstration worked best).  Pt required moderate assistance with sit to stands and cueing to improve mechanics.  Pt reports increased pain Lt foot upon standing, limited standing duration.  Seated exercises complete for LE strengthening.  EOS pt left in chair with call bell within reach and chair alarm set.  No reports of pain while sitting.    If plan is discharge home, recommend the following:     Can travel by private vehicle        Equipment Recommendations       Recommendations for Other Services       Precautions / Restrictions       Mobility  Bed Mobility               General bed mobility comments: Pt sitting in chair upon therapist entrance    Transfers Overall transfer level: Needs assistance Equipment used: Rolling walker (2 wheels) Transfers: Sit to/from Stand Sit to Stand: Mod assist           General transfer comment: HOH multimodal cueing for  proper form and mechanics, Mod A with RW front of chair, cueing for hand and foot placement to assist  with STS, less assistance required with increased reps; mod A upon standing due to unsteadiness and labored movements    Ambulation/Gait Ambulation/Gait assistance: Mod assist, Min assist Gait Distance (Feet): 3 Feet Assistive device: Rolling walker (2 wheels) Gait Pattern/deviations: Decreased step length - right, Decreased step length - left, Decreased stride length Gait velocity: slow     General Gait Details: pt limited to a few side steps at bedside with RW and min/mod assist for steadying at times, pt limited due to pain and fatigue   Stairs             Wheelchair Mobility     Tilt Bed    Modified Rankin (Stroke Patients Only)       Balance                                            Communication    Cognition                                        Cueing    Exercises General Exercises - Lower Extremity Ankle  Circles/Pumps: AROM, Both, Seated, 10 reps Long Arc Quad: AROM, Both, 10 reps, Seated Hip ABduction/ADduction: AROM, Strengthening, 10 reps, Seated Hip Flexion/Marching: AROM, Strengthening, Both, 10 reps, Seated Other Exercises Other Exercises: Sit to stand from standard chair height with cueing and demonstration to incrased ease with standing, cueing for hand and foot placement, leaning forward and to push from hands 7 times complete this session, limited by foot pain upon weight bearing limited time standing    General Comments        Pertinent Vitals/Pain      Home Living                          Prior Function            PT Goals (current goals can now be found in the care plan section)      Frequency           PT Plan      Co-evaluation              AM-PAC PT 6 Clicks Mobility   Outcome Measure                   End of Session               Time:  1328-1400 PT Time Calculation (min) (ACUTE ONLY): 32 min  Charges:    $Therapeutic Exercise: 8-22 mins $Therapeutic Activity: 8-22 mins PT General Charges $$ ACUTE PT VISIT: 1 Visit                    Augustin Mclean, LPTA/CLT; CBIS [phone]  Mclean Augustin Amble 05/21/2024, 4:04 PM

## 2024-05-22 ENCOUNTER — Inpatient Hospital Stay (HOSPITAL_COMMUNITY)

## 2024-05-22 ENCOUNTER — Other Ambulatory Visit (HOSPITAL_COMMUNITY): Payer: Self-pay | Admitting: *Deleted

## 2024-05-22 DIAGNOSIS — I6389 Other cerebral infarction: Secondary | ICD-10-CM

## 2024-05-22 DIAGNOSIS — R7881 Bacteremia: Secondary | ICD-10-CM | POA: Diagnosis not present

## 2024-05-22 DIAGNOSIS — I639 Cerebral infarction, unspecified: Secondary | ICD-10-CM | POA: Diagnosis not present

## 2024-05-22 DIAGNOSIS — I10 Essential (primary) hypertension: Secondary | ICD-10-CM | POA: Diagnosis not present

## 2024-05-22 DIAGNOSIS — B955 Unspecified streptococcus as the cause of diseases classified elsewhere: Secondary | ICD-10-CM | POA: Diagnosis not present

## 2024-05-22 DIAGNOSIS — G9341 Metabolic encephalopathy: Secondary | ICD-10-CM | POA: Diagnosis not present

## 2024-05-22 LAB — CULTURE, BLOOD (ROUTINE X 2): Special Requests: ADEQUATE

## 2024-05-22 LAB — ECHOCARDIOGRAM COMPLETE
AR max vel: 1.75 cm2
AV Area VTI: 1.65 cm2
AV Area mean vel: 1.81 cm2
AV Mean grad: 3 mmHg
AV Peak grad: 5.8 mmHg
Ao pk vel: 1.2 m/s
Area-P 1/2: 5.13 cm2
Height: 66 in
MV M vel: 3.67 m/s
MV Peak grad: 53.9 mmHg
S' Lateral: 2.5 cm
Weight: 2042.34 [oz_av]

## 2024-05-22 LAB — VITAMIN B1: Vitamin B1 (Thiamine): 108.1 nmol/L (ref 66.5–200.0)

## 2024-05-22 NOTE — Progress Notes (Signed)
 Physical Therapy Treatment Patient Details Name: [name] MRN: [id number] DOB: 01/04/1941 Today's Date: 05/22/2024   History of Present Illness [name] is an 84 y.o. female with medical history significant of hypertension, hyperlipidemia, atrial fibrillation, GERD, CVA who presents to the emergency department via EMS from [facility] due to altered mental status.  At bedside, patient was unable to provide history, history was obtained from EDP.  Per report, she 2 days of onset of worsening mental status.  Patient has about 1 month of being wheelchair-bound due to increasing right foot pain (at baseline, she was able to ambulate with a walker)..  Patient has been seen by a podiatrist for right foot pain, but otherwise no surgical options for her.  She had Unna boot which was placed about a week ago.    PT Comments  Pt supine in bed and willing to participate with therapy today.  Pt limited by [facility] which increased difficulty communicating, pt does well with demonstration to assure what is asked to complete.   Mod I with bed mobility, increased time and labored movements.  Pt with bowel movements in bed, attempted cleansing peri care though pt unable to tolerate standing due to foot pain so was cleansed in bed prior transfer training.  Mod A with transfer and multimodal cueing for proper hand placement to assist with standing.  Ambulated short distance due to fatigue, weakness and pain with RW mod A.  Pt left in chair where seated LE strengthening exercises complete.  EOS pt with call bell within reach and chair alarm set.     If plan is discharge home, recommend the following:     Can travel by private vehicle        Equipment Recommendations       Recommendations for Other Services       Precautions / Restrictions Precautions Precautions: Fall Recall of Precautions/Restrictions: Impaired     Mobility  Bed Mobility Overal bed mobility: Modified Independent Bed Mobility:  Supine to Sit     Supine to sit: Contact guard     General bed mobility comments: Increased time, labored movements cueing for rolling mechanics and use of handrail to assist    Transfers Overall transfer level: Needs assistance Equipment used: Rolling walker (2 wheels) Transfers: Sit to/from Stand Sit to Stand: Mod assist, From elevated surface           General transfer comment: Cueing for handplacement and mechanics to increase ease with STS, mod A due to  weakness    Ambulation/Gait Ambulation/Gait assistance: Mod assist, Min assist Gait Distance (Feet): 3 Feet Assistive device: Rolling walker (2 wheels) Gait Pattern/deviations: Decreased step length - right, Decreased step length - left, Decreased stride length       General Gait Details: pt limited to a few side steps at bedside with RW and min/mod assist for steadying at times, pt limited due to pain and fatigue   Stairs             Wheelchair Mobility     Tilt Bed    Modified Rankin (Stroke Patients Only)       Balance                                            Communication Communication Communication: Other (comment) Factors Affecting Communication: Hearing impaired  Cognition Arousal: Alert Behavior During Therapy: [facility]  for tasks assessed/performed                           PT - Cognition Comments: HOH Following commands: Intact      Cueing Cueing Techniques: Verbal cues, Tactile cues, Gestural cues, Visual cues  Exercises General Exercises - Lower Extremity Ankle Circles/Pumps: AROM, Both, Seated, 10 reps Long Arc Quad: AROM, Both, 10 reps, Seated Hip ABduction/ADduction: AROM, Strengthening, 10 reps, Seated Hip Flexion/Marching: AROM, Strengthening, Both, 10 reps, Seated Toe Raises: Both, 10 reps, Seated Heel Raises: AROM, Strengthening, Both, 10 reps, Seated    General Comments        Pertinent Vitals/Pain Pain Assessment Pain Assessment:  Faces Faces Pain Scale: Hurts even more Pain Location: Limited standing/gait tolerance due to Rt knee and Bil foot pain Lt>Rt Pain Descriptors / Indicators: Discomfort Pain Intervention(s): Limited activity within patient's tolerance, Monitored during session, Repositioned    Home Living                          Prior Function            PT Goals (current goals can now be found in the care plan section)      Frequency           PT Plan      Co-evaluation              AM-PAC PT 6 Clicks Mobility   Outcome Measure  Help needed turning from your back to your side while in a flat bed without using bedrails?: A Little Help needed moving from lying on your back to sitting on the side of a flat bed without using bedrails?: A Lot Help needed moving to and from a bed to a chair (including a wheelchair)?: A Little Help needed standing up from a chair using your arms (e.g., wheelchair or bedside chair)?: A Lot Help needed to walk in hospital room?: A Lot Help needed climbing 3-5 steps with a railing? : A Lot 6 Click Score: 14    End of Session Equipment Utilized During Treatment: Gait belt Activity Tolerance: Patient tolerated treatment well;Patient limited by fatigue;Patient limited by pain Patient left: in chair;with call bell/phone within reach;with chair alarm set Nurse Communication: Mobility status PT Visit Diagnosis: Unsteadiness on feet (R26.81);Other abnormalities of gait and mobility (R26.89);Muscle weakness (generalized) (M62.81)     Time: 8974-8941 PT Time Calculation (min) (ACUTE ONLY): 33 min  Charges:    $Therapeutic Exercise: 8-22 mins $Therapeutic Activity: 8-22 mins PT General Charges $$ ACUTE PT VISIT: 1 Visit                     Augustin Mclean, LPTA/CLT; CBIS [phone]  Mclean Augustin Amble 05/22/2024, 11:46 AM

## 2024-05-22 NOTE — Progress Notes (Signed)
*  PRELIMINARY RESULTS* Echocardiogram 2D Echocardiogram has been performed.  Teresa Aida PARAS 05/22/2024, 10:40 AM

## 2024-05-22 NOTE — Progress Notes (Signed)
 " Progress Note   Patient: [name] FMW:[id number] DOB: 10-Dec-1940 DOA: 05/18/2024     1 DOS: the patient was seen and examined on 05/22/2024   Brief hospital course: [name] is an 84 y.o. female with medical history significant of hypertension, hyperlipidemia, atrial fibrillation, GERD, CVA who presents to the emergency department via EMS from [facility] due to altered mental status.  At bedside, patient was unable to provide history, history was obtained from EDP.  Per report, she had 2 days of onset of worsening mental status.  Patient has about 1 month of being wheelchair-bound due to increasing right foot pain (at baseline, she was able to ambulate with a walker).  Patient has been seen by a podiatrist for right foot pain, but otherwise no surgical options for her.  She had Unna boot which was placed about a week ago.   ED course In the emergency department, she was tachycardic, but otherwise respiratory normal rate.  Workup in the ED showed normal CBC except for WBC of 11.1 and normocytic anemia.  BMP was significant for sodium of 121, chloride 89, creatinine 1.01, magnesium  0.8, calcium  7.3 MRI head without contrast was suggestive of subacute small vessel infarct MRA of head showed no large vessel occlusion Neurologist on-call, Dr. Vanessa was consulted and recommended additional lab work including vitamin B1, B6 and B12 Electrolytes replenished, Percocet was given, and hydration was provided, admitted to [facility] for further management and stroke work up.  Assessment and plans: Subacute CVA CT head without contrast showed no acute intracranial abnormality MRI head without contrast was suggestive of subacute small vessel infarct MRA of head showed no large vessel occlusion. Neurologist consult over phone, she is already optimized on low-dose Eliquis  per EDP. Continue Eliquis , Lipitor. Patient to go to ALF, PT/ OT advised HHPT. Plan for Monday discharge if blood cultures  negative.   Acute metabolic encephalopathy Strep Bacteremia Blood cultures positive for strep gordonii, follow susceptibilities. Continue Rocephin  therapy per pharmacy. Echo pending. Her mental status seem to be back to baseline. She has severe hearing impairment. Continue fall precaution, delirium precaution   Hypomagnesemia Daily repletion ordered.   Hyponatremia possibly secondary to dehydration Na 131, stable. Encourage oral diet, hydration.   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 Continue fall precaution.  Continue PT/OT.   Acquired hypothyroidism Continue Synthroid    Atrial fibrillation with controlled ventricular rate  Continue Eliquis , Lopressor    Essential hypertension Continue Lopressor    Mixed hyperlipidemia Continue Lipitor   GERD Continue PPI   Hypocalcemia Continue calcium  carbonate     Out of bed to chair. Incentive spirometry. Nursing supportive care. Fall, aspiration precautions. Diet:  Diet Orders (From admission, onward)     Start     Ordered   05/19/24 0130  Diet Heart Room service appropriate? Yes; Fluid consistency: Thin  Diet effective now       Question Answer Comment  Room service appropriate? Yes   Fluid consistency: Thin      05/19/24 0130           DVT prophylaxis: SCDs Start: 05/19/24 0129 apixaban  (ELIQUIS ) tablet 2.5 mg Start: 05/18/24 2200 apixaban  (ELIQUIS ) tablet 2.5 mg  Level of care: Telemetry   Code Status: Full Code  Subjective: Patient is seen and examined today morning. She is lying in bed. Echo tech at bedside. Able to answer me well, has hard  of hearing.  Physical Exam: Vitals:   05/21/24 1320 05/21/24 2011 05/22/24 0000 05/22/24 0451  BP: 113/64 124/72  101/69  Pulse: 76 (!) 108  75  Resp:  20 18 18   Temp: 98.2 F (36.8 C) 97.9 F (36.6 C)  97.9 F (36.6  C)  TempSrc: Oral Oral    SpO2: 94% 95%  93%  Weight:      Height:        General - Elderly Caucasian thin built female, no apparent distress HEENT - PERRLA, EOMI, atraumatic head, non tender sinuses. Lung - Clear, no rales, rhonchi, wheezes. Heart - S1, S2 heard, no murmurs, rubs, no pedal edema. Abdomen - Soft, non tender, bowel sounds good Neuro - Alert, awake and confused, non focal exam. Skin - Warm and dry.  Data Reviewed:      Latest Ref Rng & Units 05/20/2024    4:47 AM 05/19/2024    3:25 AM 05/18/2024    3:46 PM  CBC  WBC 4.0 - 10.5 K/uL 9.0  9.1  11.1   Hemoglobin 12.0 - 15.0 g/dL 89.4  88.4  88.4   Hematocrit 36.0 - 46.0 % 32.2  36.0  36.0   Platelets 150 - 400 K/uL 216  203  254       Latest Ref Rng & Units 05/20/2024    4:47 AM 05/19/2024    3:25 AM 05/18/2024    3:46 PM  BMP  Glucose 70 - 99 mg/dL 84  87  91   BUN 8 - 23 mg/dL 14  18  23    Creatinine 0.44 - 1.00 mg/dL 9.14  9.17  8.98   Sodium 135 - 145 mmol/L 131  132  131   Potassium 3.5 - 5.1 mmol/L 3.5  3.4  3.8   Chloride 98 - 111 mmol/L 94  92  89   CO2 22 - 32 mmol/L 24  24  24    Calcium  8.9 - 10.3 mg/dL 7.4  7.2  7.3    ECHOCARDIOGRAM COMPLETE Result Date: 05/22/2024    ECHOCARDIOGRAM REPORT   Patient Name:   [name] Date of Exam: 05/22/2024 Medical Rec #:  [id number]       Height:       66.0 in Accession #:    [id number]      Weight:       127.6 lb Date of Birth:  [birth year]       BSA:          1.652 m Patient Age:    83 years        BP:           101/69 mmHg Patient Gender: F               HR:           75 bpm. Exam Location:  [facility] Procedure: 2D Echo, Cardiac Doppler, Color Doppler and Strain Analysis (Both            Spectral and Color Flow Doppler were utilized during procedure). Indications:    Bacteremia R78.81  History:        Patient has prior history of Echocardiogram examinations, most                 recent 12/10/2023. Stroke, Arrythmias:Atrial Fibrillation; Risk                  Factors:Former Smoker, Hypertension and Dyslipidemia.  Sonographer:    Aida Pizza RCS Referring  Phys: [id number] CONCEPCION RISER  Sonographer Comments: Global longitudinal strain was attempted. IMPRESSIONS  1. Left ventricular ejection fraction, by estimation, is 55 to 60%. The left ventricle has normal function. The left ventricle has no regional wall motion abnormalities. There is mild left ventricular hypertrophy. Left ventricular diastolic parameters are indeterminate.  2. Mild systolic ventricular septal flattening consistent with RV pressure overload. . Right ventricular systolic function is moderately reduced. The right ventricular size is moderately enlarged. There is mildly elevated pulmonary artery systolic pressure.  3. Left atrial size was severely dilated.  4. Right atrial size was severely dilated.  5. A small pericardial effusion is present. The pericardial effusion is circumferential. There is no evidence of cardiac tamponade.  6. The mitral valve is normal in structure. Mild mitral valve regurgitation. No evidence of mitral stenosis.  7. The tricuspid valve is abnormal. Tricuspid valve regurgitation is moderate to severe.  8. The aortic valve is tricuspid. There is mild calcification of the aortic valve. There is mild thickening of the aortic valve. Aortic valve regurgitation is mild. No aortic stenosis is present.  9. The inferior vena cava is normal in size with greater than 50% respiratory variability, suggesting right atrial pressure of 3 mmHg. FINDINGS  Left Ventricle: Left ventricular ejection fraction, by estimation, is 55 to 60%. The left ventricle has normal function. The left ventricle has no regional wall motion abnormalities. Strain was performed and the global longitudinal strain is indeterminate. The left ventricular internal cavity size was normal in size. There is mild left ventricular hypertrophy. Left ventricular diastolic parameters are indeterminate. Right Ventricle: Mild  systolic ventricular septal flattening consistent with RV pressure overload. The right ventricular size is moderately enlarged. Right vetricular wall thickness was not well visualized. Right ventricular systolic function is moderately reduced. There is mildly elevated pulmonary artery systolic pressure. The tricuspid regurgitant velocity is 2.93 m/s, and with an assumed right atrial pressure of 3 mmHg, the estimated right ventricular systolic pressure is 37.3 mmHg. Left Atrium: Left atrial size was severely dilated. Right Atrium: Right atrial size was severely dilated. Pericardium: A small pericardial effusion is present. The pericardial effusion is circumferential. There is no evidence of cardiac tamponade. Mitral Valve: The mitral valve is normal in structure. There is mild thickening of the mitral valve leaflet(s). There is mild calcification of the mitral valve leaflet(s). Mild mitral annular calcification. Mild mitral valve regurgitation. No evidence of  mitral valve stenosis. Tricuspid Valve: The tricuspid valve is abnormal. Tricuspid valve regurgitation is moderate to severe. No evidence of tricuspid stenosis. Aortic Valve: The aortic valve is tricuspid. There is mild calcification of the aortic valve. There is mild thickening of the aortic valve. There is mild aortic valve annular calcification. Aortic valve regurgitation is mild. No aortic stenosis is present. Aortic valve mean gradient measures 3.0 mmHg. Aortic valve peak gradient measures 5.8 mmHg. Aortic valve area, by VTI measures 1.65 cm. Pulmonic Valve: The pulmonic valve was not well visualized. Pulmonic valve regurgitation is not visualized. No evidence of pulmonic stenosis. Venous: The inferior vena cava is normal in size with greater than 50% respiratory variability, suggesting right atrial pressure of 3 mmHg. IAS/Shunts: The interatrial septum was not well visualized.  LEFT VENTRICLE PLAX 2D LVIDd:         3.90 cm LVIDs:         2.50 cm LV PW:          1.10 cm LV IVS:        1.10 cm  LVOT diam:     2.00 cm LV SV:         37 LV SV Index:   22 LVOT Area:     3.14 cm  RIGHT VENTRICLE RV S prime:     8.70 cm/s TAPSE (M-mode): 1.3 cm LEFT ATRIUM              Index        RIGHT ATRIUM           Index LA diam:        3.50 cm  2.12 cm/m   RA Area:     40.40 cm LA Vol (A2C):   98.6 ml  59.67 ml/m  RA Volume:   178.00 ml 107.73 ml/m LA Vol (A4C):   98.6 ml  59.67 ml/m LA Biplane Vol: 107.0 ml 64.76 ml/m  AORTIC VALVE AV Area (Vmax):    1.75 cm AV Area (Vmean):   1.81 cm AV Area (VTI):     1.65 cm AV Vmax:           120.31 cm/s AV Vmean:          81.561 cm/s AV VTI:            0.224 m AV Peak Grad:      5.8 mmHg AV Mean Grad:      3.0 mmHg LVOT Vmax:         66.83 cm/s LVOT Vmean:        46.900 cm/s LVOT VTI:          0.118 m LVOT/AV VTI ratio: 0.53  AORTA Ao Root diam: 3.00 cm MITRAL VALVE                TRICUSPID VALVE MV Area (PHT): 5.13 cm     TR Peak grad:   34.3 mmHg MV Decel Time: 148 msec     TR Vmax:        293.00 cm/s MR Peak grad: 53.9 mmHg MR Mean grad: 32.0 mmHg     SHUNTS MR Vmax:      367.00 cm/s   Systemic VTI:  0.12 m MR Vmean:     265.0 cm/s    Systemic Diam: 2.00 cm MV E velocity: 112.00 cm/s Dorn Ross MD Electronically signed by Dorn Ross MD Signature Date/Time: 05/22/2024/12:47:09 PM    Final      Family Communication: no family at bedside.  Disposition: Status is: inpatient - positive blood cultures, IV abx, follow repeat cultures, echo.  Planned Discharge Destination: Home with Home Health     Time spent: 42 minutes  Author: Concepcion Riser, MD 05/22/2024 4:22 PM Secure chat 7am to 7pm For on call review [email or web address]    "

## 2024-05-22 NOTE — Plan of Care (Signed)

## 2024-05-23 DIAGNOSIS — I639 Cerebral infarction, unspecified: Secondary | ICD-10-CM | POA: Diagnosis not present

## 2024-05-23 DIAGNOSIS — G9341 Metabolic encephalopathy: Secondary | ICD-10-CM | POA: Diagnosis not present

## 2024-05-23 DIAGNOSIS — I10 Essential (primary) hypertension: Secondary | ICD-10-CM | POA: Diagnosis not present

## 2024-05-23 DIAGNOSIS — B955 Unspecified streptococcus as the cause of diseases classified elsewhere: Secondary | ICD-10-CM | POA: Diagnosis not present

## 2024-05-23 DIAGNOSIS — R7881 Bacteremia: Secondary | ICD-10-CM | POA: Diagnosis not present

## 2024-05-23 LAB — CULTURE, BLOOD (ROUTINE X 2)
Culture: NO GROWTH
Special Requests: ADEQUATE

## 2024-05-23 NOTE — Plan of Care (Signed)

## 2024-05-23 NOTE — Progress Notes (Signed)
 " Progress Note   Patient: [name] FMW:[id number] DOB: 1941-02-09 DOA: 05/18/2024     2 DOS: the patient was seen and examined on 05/23/2024   Brief hospital course: [name] is an 84 y.o. female with medical history significant of hypertension, hyperlipidemia, atrial fibrillation, HFpEF, GERD, CVA who presents to the emergency department via EMS from [facility] due to altered mental status.  At bedside, patient was unable to provide history, history was obtained from EDP.  Per report, she had 2 days of onset of worsening mental status.  Patient has about 1 month of being wheelchair-bound due to increasing right foot pain (at baseline, she was able to ambulate with a walker).  Patient has been seen by a podiatrist for right foot pain, but otherwise no surgical options for her.  She had Unna boot which was placed about a week ago.   ED course In the emergency department, she was tachycardic, but otherwise respiratory normal rate.  Workup in the ED showed normal CBC except for WBC of 11.1 and normocytic anemia.  BMP was significant for sodium of 121, chloride 89, creatinine 1.01, magnesium  0.8, calcium  7.3 MRI head without contrast was suggestive of subacute small vessel infarct MRA of head showed no large vessel occlusion Neurologist on-call, Dr. Vanessa was consulted and recommended additional lab work including vitamin B1, B6 and B12 Electrolytes replenished, Percocet was given, and hydration was provided, admitted to [facility] for further management and stroke work up.  Assessment and plans: Subacute CVA MRI head without contrast was suggestive of subacute small vessel infarct MRA of head showed no large vessel occlusion.  Neurologist advised to continue low-dose Eliquis , lipitor. Patient to go to ALF, PT/ OT advised HHPT. Plan for Monday discharge if blood cultures negative.   Acute metabolic encephalopathy Strep gordonii Bacteremia Blood cultures positive for strep gordonii,  susceptibilities reviewed. Continue Rocephin  therapy per pharmacy. Echo reviewed Her mental status seem to be back to baseline. She has severe hearing impairment. Continue fall precaution, delirium precaution   Hypomagnesemia Daily repletion ordered.   Hyponatremia possibly secondary to dehydration Na 131, stable. Encourage oral diet, hydration.   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 Continue fall precaution.  Continue PT/OT.   Acquired hypothyroidism Continue Synthroid    Atrial fibrillation with controlled ventricular rate  Continue Eliquis , Lopressor    Essential hypertension Continue Lopressor    Mixed hyperlipidemia Continue Lipitor   GERD Continue PPI   Hypocalcemia Continue calcium  carbonate     Out of bed to chair. Incentive spirometry. Nursing supportive care. Fall, aspiration precautions. Diet:  Diet Orders (From admission, onward)     Start     Ordered   05/19/24 0130  Diet Heart Room service appropriate? Yes; Fluid consistency: Thin  Diet effective now       Question Answer Comment  Room service appropriate? Yes   Fluid consistency: Thin      05/19/24 0130           DVT prophylaxis: SCDs Start: 05/19/24 0129 apixaban  (ELIQUIS ) tablet 2.5 mg Start: 05/18/24 2200 apixaban  (ELIQUIS ) tablet 2.5 mg  Level of care: Telemetry   Code Status: Full Code  Subjective: Patient is seen and examined today morning. She is lying in bed. Echo tech at bedside. Able to answer me well, has hard of hearing.  Physical Exam: Vitals:   05/22/24 1530 05/22/24 2022 05/23/24 0600 05/23/24  0801  BP: 111/66 122/62 128/76 114/81  Pulse: 81 91 88   Resp:  17  20  Temp: 98 F (36.7 C) 97.8 F (36.6 C) (!) 97.4 F (36.3 C)   TempSrc: Oral Oral Oral   SpO2: 92% 97% 97%   Weight:      Height:        General - Elderly  Caucasian thin built female, no apparent distress HEENT - PERRLA, EOMI, atraumatic head, non tender sinuses. Lung - Clear, no rales, rhonchi, wheezes. Heart - S1, S2 heard, no murmurs, rubs, no pedal edema. Abdomen - Soft, non tender, bowel sounds good Neuro - Alert, awake and confused, non focal exam. Skin - Warm and dry.  Data Reviewed:      Latest Ref Rng & Units 05/20/2024    4:47 AM 05/19/2024    3:25 AM 05/18/2024    3:46 PM  CBC  WBC 4.0 - 10.5 K/uL 9.0  9.1  11.1   Hemoglobin 12.0 - 15.0 g/dL 89.4  88.4  88.4   Hematocrit 36.0 - 46.0 % 32.2  36.0  36.0   Platelets 150 - 400 K/uL 216  203  254       Latest Ref Rng & Units 05/20/2024    4:47 AM 05/19/2024    3:25 AM 05/18/2024    3:46 PM  BMP  Glucose 70 - 99 mg/dL 84  87  91   BUN 8 - 23 mg/dL 14  18  23    Creatinine 0.44 - 1.00 mg/dL 9.14  9.17  8.98   Sodium 135 - 145 mmol/L 131  132  131   Potassium 3.5 - 5.1 mmol/L 3.5  3.4  3.8   Chloride 98 - 111 mmol/L 94  92  89   CO2 22 - 32 mmol/L 24  24  24    Calcium  8.9 - 10.3 mg/dL 7.4  7.2  7.3    ECHOCARDIOGRAM COMPLETE Result Date: 05/22/2024    ECHOCARDIOGRAM REPORT   Patient Name:   [name] Date of Exam: 05/22/2024 Medical Rec #:  [id number]       Height:       66.0 in Accession #:    [id number]      Weight:       127.6 lb Date of Birth:  [birth year]       BSA:          1.652 m Patient Age:    83 years        BP:           101/69 mmHg Patient Gender: F               HR:           75 bpm. Exam Location:  [facility] Procedure: 2D Echo, Cardiac Doppler, Color Doppler and Strain Analysis (Both            Spectral and Color Flow Doppler were utilized during procedure). Indications:    Bacteremia R78.81  History:        Patient has prior history of Echocardiogram examinations, most                 recent 12/10/2023. Stroke, Arrythmias:Atrial Fibrillation; Risk                 Factors:Former Smoker, Hypertension and Dyslipidemia.  Sonographer:    Aida Pizza RCS Referring Phys:  [id number] CONCEPCION RISER  Sonographer Comments: Global longitudinal strain was attempted. IMPRESSIONS  1.  Left ventricular ejection fraction, by estimation, is 55 to 60%. The left ventricle has normal function. The left ventricle has no regional wall motion abnormalities. There is mild left ventricular hypertrophy. Left ventricular diastolic parameters are indeterminate.  2. Mild systolic ventricular septal flattening consistent with RV pressure overload. . Right ventricular systolic function is moderately reduced. The right ventricular size is moderately enlarged. There is mildly elevated pulmonary artery systolic pressure.  3. Left atrial size was severely dilated.  4. Right atrial size was severely dilated.  5. A small pericardial effusion is present. The pericardial effusion is circumferential. There is no evidence of cardiac tamponade.  6. The mitral valve is normal in structure. Mild mitral valve regurgitation. No evidence of mitral stenosis.  7. The tricuspid valve is abnormal. Tricuspid valve regurgitation is moderate to severe.  8. The aortic valve is tricuspid. There is mild calcification of the aortic valve. There is mild thickening of the aortic valve. Aortic valve regurgitation is mild. No aortic stenosis is present.  9. The inferior vena cava is normal in size with greater than 50% respiratory variability, suggesting right atrial pressure of 3 mmHg. FINDINGS  Left Ventricle: Left ventricular ejection fraction, by estimation, is 55 to 60%. The left ventricle has normal function. The left ventricle has no regional wall motion abnormalities. Strain was performed and the global longitudinal strain is indeterminate. The left ventricular internal cavity size was normal in size. There is mild left ventricular hypertrophy. Left ventricular diastolic parameters are indeterminate. Right Ventricle: Mild systolic ventricular septal flattening consistent with RV pressure overload. The right ventricular size is  moderately enlarged. Right vetricular wall thickness was not well visualized. Right ventricular systolic function is moderately reduced. There is mildly elevated pulmonary artery systolic pressure. The tricuspid regurgitant velocity is 2.93 m/s, and with an assumed right atrial pressure of 3 mmHg, the estimated right ventricular systolic pressure is 37.3 mmHg. Left Atrium: Left atrial size was severely dilated. Right Atrium: Right atrial size was severely dilated. Pericardium: A small pericardial effusion is present. The pericardial effusion is circumferential. There is no evidence of cardiac tamponade. Mitral Valve: The mitral valve is normal in structure. There is mild thickening of the mitral valve leaflet(s). There is mild calcification of the mitral valve leaflet(s). Mild mitral annular calcification. Mild mitral valve regurgitation. No evidence of  mitral valve stenosis. Tricuspid Valve: The tricuspid valve is abnormal. Tricuspid valve regurgitation is moderate to severe. No evidence of tricuspid stenosis. Aortic Valve: The aortic valve is tricuspid. There is mild calcification of the aortic valve. There is mild thickening of the aortic valve. There is mild aortic valve annular calcification. Aortic valve regurgitation is mild. No aortic stenosis is present. Aortic valve mean gradient measures 3.0 mmHg. Aortic valve peak gradient measures 5.8 mmHg. Aortic valve area, by VTI measures 1.65 cm. Pulmonic Valve: The pulmonic valve was not well visualized. Pulmonic valve regurgitation is not visualized. No evidence of pulmonic stenosis. Venous: The inferior vena cava is normal in size with greater than 50% respiratory variability, suggesting right atrial pressure of 3 mmHg. IAS/Shunts: The interatrial septum was not well visualized.  LEFT VENTRICLE PLAX 2D LVIDd:         3.90 cm LVIDs:         2.50 cm LV PW:         1.10 cm LV IVS:        1.10 cm LVOT diam:     2.00 cm LV SV:  37 LV SV Index:   22 LVOT Area:      3.14 cm  RIGHT VENTRICLE RV S prime:     8.70 cm/s TAPSE (M-mode): 1.3 cm LEFT ATRIUM              Index        RIGHT ATRIUM           Index LA diam:        3.50 cm  2.12 cm/m   RA Area:     40.40 cm LA Vol (A2C):   98.6 ml  59.67 ml/m  RA Volume:   178.00 ml 107.73 ml/m LA Vol (A4C):   98.6 ml  59.67 ml/m LA Biplane Vol: 107.0 ml 64.76 ml/m  AORTIC VALVE AV Area (Vmax):    1.75 cm AV Area (Vmean):   1.81 cm AV Area (VTI):     1.65 cm AV Vmax:           120.31 cm/s AV Vmean:          81.561 cm/s AV VTI:            0.224 m AV Peak Grad:      5.8 mmHg AV Mean Grad:      3.0 mmHg LVOT Vmax:         66.83 cm/s LVOT Vmean:        46.900 cm/s LVOT VTI:          0.118 m LVOT/AV VTI ratio: 0.53  AORTA Ao Root diam: 3.00 cm MITRAL VALVE                TRICUSPID VALVE MV Area (PHT): 5.13 cm     TR Peak grad:   34.3 mmHg MV Decel Time: 148 msec     TR Vmax:        293.00 cm/s MR Peak grad: 53.9 mmHg MR Mean grad: 32.0 mmHg     SHUNTS MR Vmax:      367.00 cm/s   Systemic VTI:  0.12 m MR Vmean:     265.0 cm/s    Systemic Diam: 2.00 cm MV E velocity: 112.00 cm/s Dorn Ross MD Electronically signed by Dorn Ross MD Signature Date/Time: 05/22/2024/12:47:09 PM    Final    Family Communication: no family at bedside.  Disposition: Status is: inpatient - positive blood cultures, IV abx, follow repeat cultures, echo.  Planned Discharge Destination: Home with Home Health     Time spent: 42 minutes  Author: Concepcion Riser, MD 05/23/2024 11:12 AM Secure chat 7am to 7pm For on call review [email or web address]    "

## 2024-05-24 DIAGNOSIS — R7881 Bacteremia: Secondary | ICD-10-CM | POA: Diagnosis not present

## 2024-05-24 DIAGNOSIS — G9341 Metabolic encephalopathy: Secondary | ICD-10-CM | POA: Diagnosis not present

## 2024-05-24 DIAGNOSIS — I10 Essential (primary) hypertension: Secondary | ICD-10-CM | POA: Diagnosis not present

## 2024-05-24 DIAGNOSIS — B955 Unspecified streptococcus as the cause of diseases classified elsewhere: Secondary | ICD-10-CM | POA: Diagnosis not present

## 2024-05-24 DIAGNOSIS — I639 Cerebral infarction, unspecified: Secondary | ICD-10-CM | POA: Diagnosis not present

## 2024-05-24 NOTE — Progress Notes (Signed)
 " Progress Note   Patient: [name] FMW:[id number] DOB: 05/14/1940 DOA: 05/18/2024     3 DOS: the patient was seen and examined on 05/24/2024   Brief hospital course: [name] is an 84 y.o. female with medical history significant of hypertension, hyperlipidemia, atrial fibrillation, HFpEF, GERD, CVA who presents to the emergency department via EMS from [facility] due to altered mental status.  At bedside, patient was unable to provide history, history was obtained from EDP.  Per report, she had 2 days of onset of worsening mental status.  Patient has about 1 month of being wheelchair-bound due to increasing right foot pain (at baseline, she was able to ambulate with a walker).  Patient has been seen by a podiatrist for right foot pain, but otherwise no surgical options for her.  She had Unna boot which was placed about a week ago.   ED course In the emergency department, she was tachycardic, but otherwise respiratory normal rate.  Workup in the ED showed normal CBC except for WBC of 11.1 and normocytic anemia.  BMP was significant for sodium of 121, chloride 89, creatinine 1.01, magnesium  0.8, calcium  7.3 MRI head without contrast was suggestive of subacute small vessel infarct MRA of head showed no large vessel occlusion Neurologist on-call, Dr. Vanessa was consulted and recommended additional lab work including vitamin B1, B6 and B12 Electrolytes replenished, Percocet was given, and hydration was provided, admitted to [facility] for further management and stroke work up. Patient found to have strep bacteremia, on Rocephin  therapy, repeat blood cultures pending  Assessment and plans: Subacute CVA MRI head without contrast was suggestive of subacute small vessel infarct MRA of head showed no large vessel occlusion.  Neurologist advised to continue low-dose Eliquis , lipitor. Patient to go to ALF, PT/ OT advised HHPT. Plan for Monday discharge if blood cultures negative.   Acute metabolic  encephalopathy Strep gordonii Bacteremia Blood cultures positive for strep gordonii, susceptibilities reviewed. Continue Rocephin  therapy per pharmacy. Echo reviewed no vegetation noted. Her mental status seem to be back to baseline. She has severe hearing impairment. Continue fall precaution, delirium precaution   Hypomagnesemia Repleted.   Hyponatremia possibly secondary to dehydration Na 131, stable. Encourage oral diet, hydration.   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 Continue fall precaution.  Continue PT/OT.   Acquired hypothyroidism Continue Synthroid    Atrial fibrillation with controlled ventricular rate  Continue Eliquis , Lopressor    Essential hypertension Continue Lopressor    Mixed hyperlipidemia Continue Lipitor   GERD Continue PPI   Hypocalcemia Continue calcium  carbonate     Out of bed to chair. Incentive spirometry. Nursing supportive care. Fall, aspiration precautions. Diet:  Diet Orders (From admission, onward)     Start     Ordered   05/19/24 0130  Diet Heart Room service appropriate? Yes; Fluid consistency: Thin  Diet effective now       Question Answer Comment  Room service appropriate? Yes   Fluid consistency: Thin      05/19/24 0130           DVT prophylaxis: SCDs Start: 05/19/24 0129 apixaban  (ELIQUIS ) tablet 2.5 mg Start: 05/18/24 2200 apixaban  (ELIQUIS ) tablet 2.5 mg  Level of care: Telemetry   Code Status: Full Code  Subjective: Patient is seen and examined today morning. She is still up and comfortably.  Denies any complaints.  Has hard of hearing.  Physical  Exam: Vitals:   05/23/24 1946 05/24/24 0500 05/24/24 0811 05/24/24 1347  BP: 113/67 (!) 111/57 106/66 102/63  Pulse: 75 78 (!) 109 81  Resp: 18 18    Temp: 98.4 F (36.9 C) 97.6 F (36.4 C)  98.5 F (36.9 C)  TempSrc:  Axillary Oral    SpO2: 92% 92%  93%  Weight:      Height:        General - Elderly Caucasian thin built female, no apparent distress HEENT - PERRLA, EOMI, atraumatic head, hard of hearing. Lung - Clear, no rales, rhonchi, wheezes. Heart - S1, S2 heard, no murmurs, rubs, no pedal edema. Abdomen - Soft, non tender, bowel sounds good Neuro -sleeping, arousable, non focal exam. Skin - Warm and dry.  Data Reviewed:      Latest Ref Rng & Units 05/20/2024    4:47 AM 05/19/2024    3:25 AM 05/18/2024    3:46 PM  CBC  WBC 4.0 - 10.5 K/uL 9.0  9.1  11.1   Hemoglobin 12.0 - 15.0 g/dL 89.4  88.4  88.4   Hematocrit 36.0 - 46.0 % 32.2  36.0  36.0   Platelets 150 - 400 K/uL 216  203  254       Latest Ref Rng & Units 05/20/2024    4:47 AM 05/19/2024    3:25 AM 05/18/2024    3:46 PM  BMP  Glucose 70 - 99 mg/dL 84  87  91   BUN 8 - 23 mg/dL 14  18  23    Creatinine 0.44 - 1.00 mg/dL 9.14  9.17  8.98   Sodium 135 - 145 mmol/L 131  132  131   Potassium 3.5 - 5.1 mmol/L 3.5  3.4  3.8   Chloride 98 - 111 mmol/L 94  92  89   CO2 22 - 32 mmol/L 24  24  24    Calcium  8.9 - 10.3 mg/dL 7.4  7.2  7.3    No results found.  Family Communication: no family at bedside.  Disposition: Status is: inpatient - positive blood cultures, IV abx, follow repeat cultures, echo.  Planned Discharge Destination: Home with Home Health     Time spent: 41 minutes  Author: Concepcion Riser, MD 05/24/2024 2:57 PM Secure chat 7am to 7pm For on call review [email or web address]    "

## 2024-05-24 NOTE — Plan of Care (Signed)

## 2024-05-25 DIAGNOSIS — R7881 Bacteremia: Secondary | ICD-10-CM | POA: Diagnosis not present

## 2024-05-25 DIAGNOSIS — I10 Essential (primary) hypertension: Secondary | ICD-10-CM | POA: Diagnosis not present

## 2024-05-25 DIAGNOSIS — I639 Cerebral infarction, unspecified: Secondary | ICD-10-CM | POA: Diagnosis not present

## 2024-05-25 DIAGNOSIS — G9341 Metabolic encephalopathy: Secondary | ICD-10-CM | POA: Diagnosis not present

## 2024-05-25 DIAGNOSIS — B955 Unspecified streptococcus as the cause of diseases classified elsewhere: Secondary | ICD-10-CM | POA: Diagnosis not present

## 2024-05-25 MED ORDER — OXYCODONE-ACETAMINOPHEN 5-325 MG PO TABS: 1.0000 | ORAL_TABLET | Freq: Four times a day (QID) | ORAL | Status: DC | PRN

## 2024-05-25 NOTE — Plan of Care (Signed)
  Problem: Education: Goal: Knowledge of General Education information will improve Description: Including pain rating scale, medication(s)/side effects and non-pharmacologic comfort measures Outcome: Progressing   Problem: Health Behavior/Discharge Planning: Goal: Ability to manage health-related needs will improve Outcome: Progressing   Problem: Clinical Measurements: Goal: Ability to maintain clinical measurements within normal limits will improve Outcome: Progressing   Problem: Nutrition: Goal: Adequate nutrition will be maintained Outcome: Progressing   Problem: Coping: Goal: Level of anxiety will decrease Outcome: Progressing   Problem: Elimination: Goal: Will not experience complications related to bowel motility Outcome: Progressing   Problem: Safety: Goal: Ability to remain free from injury will improve Outcome: Progressing   Problem: Skin Integrity: Goal: Risk for impaired skin integrity will decrease Outcome: Progressing

## 2024-05-25 NOTE — Plan of Care (Signed)
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
## 2024-05-25 NOTE — Progress Notes (Signed)
 Occupational Therapy Treatment Patient Details Name: [name] MRN: [id number] DOB: 05-Nov-1940 Today's Date: 05/25/2024   History of present illness [name] is an 84 y.o. female with medical history significant of hypertension, hyperlipidemia, atrial fibrillation, GERD, CVA who presents to the emergency department via EMS from [facility] due to altered mental status.  At bedside, patient was unable to provide history, history was obtained from EDP.  Per report, she 2 days of onset of worsening mental status.  Patient has about 1 month of being wheelchair-bound due to increasing right foot pain (at baseline, she was able to ambulate with a walker)..  Patient has been seen by a podiatrist for right foot pain, but otherwise no surgical options for her.  She had Unna boot which was placed about a week ago.   OT comments  This session pt was pleasantly confused on OT arrival. Agreeable to all exercises. She has weak Ue's and is unable to lift them against gravity at the shoulder joint. OT providing passive ROM for the shoulders and pt completing active ROM at elbow, wrist, and hand. Pt requiring mod A for all functional mobility, at times very heavy due to B/L foot/leg pain. Continues to require mod to max assist for all ADL's. Pt will continue to benefit from skilled OT to maximize independence and safety.       If plan is discharge home, recommend the following:  A lot of help with walking and/or transfers;A lot of help with bathing/dressing/bathroom;Assistance with cooking/housework;Direct supervision/assist for medications management;Assist for transportation;Help with stairs or ramp for entrance   Equipment Recommendations  None recommended by OT    Recommendations for Other Services      Precautions / Restrictions Precautions Precautions: Fall Recall of Precautions/Restrictions: Impaired Restrictions Weight Bearing Restrictions Per Provider Order: No       Mobility Bed  Mobility               General bed mobility comments: In chair on arrival    Transfers Overall transfer level: Needs assistance Equipment used: Rolling walker (2 wheels) Transfers: Sit to/from Stand, Bed to chair/wheelchair/BSC Sit to Stand: Mod assist Stand pivot transfers: Mod assist         General transfer comment: Heavy mod A due to pain and confusion     Balance                                           ADL either performed or assessed with clinical judgement   ADL Overall ADL's : Needs assistance/impaired                         Toilet Transfer: Moderate assistance;Stand-pivot   Toileting- Clothing Manipulation and Hygiene: Maximal assistance;Sitting/lateral lean;Sit to/from stand Toileting - Clothing Manipulation Details (indicate cue type and reason): Limited due to BUE Weakness and confusion            Extremity/Trunk Assessment Upper Extremity Assessment Upper Extremity Assessment: Generalized weakness            Vision       Perception     Praxis     Communication Communication Communication: Other (comment) Factors Affecting Communication: Hearing impaired   Cognition Arousal: Alert Behavior During Therapy: WFL for tasks assessed/performed Cognition: Cognition impaired   Orientation impairments: Person Awareness: Intellectual awareness impaired Memory impairment (select all impairments):  Short-term memory Attention impairment (select first level of impairment): Alternating attention Executive functioning impairment (select all impairments): Organization, Sequencing, Problem solving OT - Cognition Comments: Pt reported this session that she lives in [place]                  Following commands: Impaired Following commands impaired: Follows one step commands inconsistently      Cueing   Cueing Techniques: Verbal cues, Tactile cues, Gestural cues, Visual cues  Exercises Exercises: Other  exercises Other Exercises Other Exercises: P/ROM: shoulder flexion, abduction Other Exercises: A/ROM: protraction, elbow flexion, supination/pronation Other Exercises: Digit composite flexion    Shoulder Instructions       General Comments      Pertinent Vitals/ Pain       Pain Assessment Pain Assessment: Faces Faces Pain Scale: Hurts little more Pain Location: B/l foot pain Pain Descriptors / Indicators: Aching, Discomfort Pain Intervention(s): Limited activity within patient's tolerance, Monitored during session, Repositioned  Home Living                                          Prior Functioning/Environment              Frequency  Min 2X/week        Progress Toward Goals  OT Goals(current goals can now be found in the care plan section)  Progress towards OT goals: Progressing toward goals  Acute Rehab OT Goals Patient Stated Goal: To move more OT Goal Formulation: With patient Time For Goal Achievement: 06/02/24 Potential to Achieve Goals: Fair  Plan      Co-evaluation                 AM-PAC OT 6 Clicks Daily Activity     Outcome Measure   Help from another person eating meals?: A Little Help from another person taking care of personal grooming?: A Little Help from another person toileting, which includes using toliet, bedpan, or urinal?: A Lot Help from another person bathing (including washing, rinsing, drying)?: A Lot Help from another person to put on and taking off regular upper body clothing?: A Little Help from another person to put on and taking off regular lower body clothing?: A Lot 6 Click Score: 15    End of Session Equipment Utilized During Treatment: Gait belt;Rolling walker (2 wheels)  OT Visit Diagnosis: Unsteadiness on feet (R26.81);Other abnormalities of gait and mobility (R26.89);Muscle weakness (generalized) (M62.81);Other symptoms and signs involving cognitive function   Activity Tolerance Patient  tolerated treatment well   Patient Left in chair;with call bell/phone within reach;with chair alarm set   Nurse Communication Mobility status        Time: 1400-1425 OT Time Calculation (min): 25 min  Charges: OT General Charges $OT Visit: 1 Visit OT Treatments $Self Care/Home Management : 8-22 mins $Therapeutic Activity: 8-22 mins  [name], OTR/L [facility] Acute Rehabilitation  [name] [name] 05/25/2024, 2:32 PM

## 2024-05-25 NOTE — Progress Notes (Signed)
 Physical Therapy Treatment Patient Details Name: [name] MRN: [id number] DOB: 1940/09/26 Today's Date: 05/25/2024   History of Present Illness [name] is an 84 y.o. female with medical history significant of hypertension, hyperlipidemia, atrial fibrillation, GERD, CVA who presents to the emergency department via EMS from [facility] due to altered mental status.  At bedside, patient was unable to provide history, history was obtained from EDP.  Per report, she 2 days of onset of worsening mental status.  Patient has about 1 month of being wheelchair-bound due to increasing right foot pain (at baseline, she was able to ambulate with a walker)..  Patient has been seen by a podiatrist for right foot pain, but otherwise no surgical options for her.  She had Unna boot which was placed about a week ago.    PT Comments  Pt supine in bed and willing to participate.  Pt limited by [facility] so unable to tell if increased confusion this date.  Pt required increased time with bed mobility and cueing for hand placement reaching toward hand rails on bed to assist.  Pt presents with increased fear of falling with transfer training and reports of pain Lt foot with gait bearing.  Mod A with STS and during gait to chair, able to complete sidesteps front of bed.  EOS pt left in chair with call bell within reach and chair alarm set.    If plan is discharge home, recommend the following:     Can travel by private vehicle        Equipment Recommendations       Recommendations for Other Services       Precautions / Restrictions Precautions Precautions: Fall Recall of Precautions/Restrictions: Impaired Restrictions Weight Bearing Restrictions Per Provider Order: No     Mobility  Bed Mobility Overal bed mobility: Modified Independent Bed Mobility: Supine to Sit     Supine to sit: Contact guard     General bed mobility comments: Increased time, labored movements cueing for rolling mechanics and  use of handrail to assist    Transfers Overall transfer level: Needs assistance Equipment used: Rolling walker (2 wheels) Transfers: Sit to/from Stand Sit to Stand: Mod assist, From elevated surface           General transfer comment: Cueing for handplacement and mechanics to increase ease with STS, mod A due to  weakness    Ambulation/Gait Ambulation/Gait assistance: Mod assist, Min assist Gait Distance (Feet): 3 Feet Assistive device: Rolling walker (2 wheels) Gait Pattern/deviations: Decreased step length - right, Decreased step length - left, Decreased stride length Gait velocity: slow     General Gait Details: pt limited to a few side steps at bedside with RW and min/mod assist for steadying at times, pt limited due to pain and fatigue   Stairs             Wheelchair Mobility     Tilt Bed    Modified Rankin (Stroke Patients Only)       Balance                                            Communication Communication Communication: Other (comment) Factors Affecting Communication: Hearing impaired  Cognition Arousal: Alert Behavior During Therapy: [facility] for tasks assessed/performed  PT - Cognition Comments: HOH Following commands: Intact      Cueing Cueing Techniques: Verbal cues, Tactile cues, Gestural cues, Visual cues  Exercises General Exercises - Lower Extremity Long Arc Quad: AROM, Both, 10 reps, Seated Hip ABduction/ADduction: AROM, Strengthening, 10 reps, Seated Hip Flexion/Marching: AROM, Strengthening, Both, 10 reps, Seated Toe Raises: Both, 10 reps, Seated Heel Raises: AROM, Strengthening, Both, 10 reps, Seated    General Comments        Pertinent Vitals/Pain Pain Assessment Pain Assessment: Faces Faces Pain Scale: Hurts even more Pain Location: Limited standing/gait tolerance due to Rt knee and Bil foot pain Lt>Rt Pain Descriptors / Indicators: Discomfort Pain  Intervention(s): Limited activity within patient's tolerance, Monitored during session, Repositioned    Home Living                          Prior Function            PT Goals (current goals can now be found in the care plan section)      Frequency           PT Plan      Co-evaluation              AM-PAC PT 6 Clicks Mobility   Outcome Measure  Help needed turning from your back to your side while in a flat bed without using bedrails?: A Little Help needed moving from lying on your back to sitting on the side of a flat bed without using bedrails?: A Lot Help needed moving to and from a bed to a chair (including a wheelchair)?: A Little Help needed standing up from a chair using your arms (e.g., wheelchair or bedside chair)?: A Lot Help needed to walk in hospital room?: A Lot Help needed climbing 3-5 steps with a railing? : A Lot 6 Click Score: 14    End of Session Equipment Utilized During Treatment: Gait belt Activity Tolerance: Patient tolerated treatment well;Patient limited by fatigue;Patient limited by pain Patient left: in chair;with call bell/phone within reach;with chair alarm set Nurse Communication: Mobility status PT Visit Diagnosis: Unsteadiness on feet (R26.81);Other abnormalities of gait and mobility (R26.89);Muscle weakness (generalized) (M62.81)     Time: 8894-8861 PT Time Calculation (min) (ACUTE ONLY): 33 min  Charges:    $Therapeutic Activity: 23-37 mins PT General Charges $$ ACUTE PT VISIT: 1 Visit                    Augustin Mclean, LPTA/CLT; CBIS [phone]  Mclean Augustin Amble 05/25/2024, 11:52 AM

## 2024-05-26 DIAGNOSIS — E43 Unspecified severe protein-calorie malnutrition: Secondary | ICD-10-CM | POA: Insufficient documentation

## 2024-05-26 LAB — CULTURE, BLOOD (ROUTINE X 2)
Culture: NO GROWTH
Culture: NO GROWTH
Special Requests: ADEQUATE
Special Requests: ADEQUATE

## 2024-05-26 MED ORDER — FUROSEMIDE 20 MG PO TABS: 20.0000 mg | ORAL_TABLET | Freq: Every day | ORAL | 2 refills | Status: AC

## 2024-05-26 MED ORDER — MAGNESIUM OXIDE -MG SUPPLEMENT 400 (240 MG) MG PO TABS: 400.0000 mg | ORAL_TABLET | Freq: Every day | ORAL | 0 refills | Status: DC

## 2024-05-26 MED ORDER — CEPHALEXIN 250 MG PO CAPS: 250.0000 mg | ORAL_CAPSULE | Freq: Three times a day (TID) | ORAL | 0 refills | Status: DC

## 2024-05-26 MED ORDER — ENSURE PLUS HIGH PROTEIN PO LIQD: 237.0000 mL | Freq: Two times a day (BID) | ORAL | 1 refills | Status: AC

## 2024-05-26 MED ORDER — ENSURE PLUS HIGH PROTEIN PO LIQD: 237.0000 mL | Freq: Two times a day (BID) | ORAL | 1 refills | Status: DC

## 2024-05-26 MED ORDER — METOPROLOL TARTRATE 25 MG PO TABS: 12.5000 mg | ORAL_TABLET | Freq: Two times a day (BID) | ORAL | 2 refills | Status: AC

## 2024-05-26 MED ORDER — ADULT MULTIVITAMIN W/MINERALS CH: 1.0000 | ORAL_TABLET | Freq: Every day | ORAL | Status: DC

## 2024-05-26 MED ORDER — MAGNESIUM OXIDE -MG SUPPLEMENT 400 (240 MG) MG PO TABS: 400.0000 mg | ORAL_TABLET | Freq: Every day | ORAL | 0 refills | Status: AC

## 2024-05-26 MED ORDER — ADULT MULTIVITAMIN W/MINERALS CH
1.0000 | ORAL_TABLET | Freq: Every day | ORAL | Status: DC
  Administered 2024-05-26: 1 via ORAL
  Filled 2024-05-26: qty 1

## 2024-05-26 MED ORDER — CEPHALEXIN 250 MG PO CAPS: 250.0000 mg | ORAL_CAPSULE | Freq: Three times a day (TID) | ORAL | 0 refills | Status: AC

## 2024-05-26 MED ORDER — ADULT MULTIVITAMIN W/MINERALS CH: 1.0000 | ORAL_TABLET | Freq: Every day | ORAL | 1 refills | Status: AC

## 2024-05-26 NOTE — Plan of Care (Signed)

## 2024-05-26 NOTE — Progress Notes (Signed)
 Mobility Specialist Progress Note:    05/26/24 1450  Mobility  Activity Pivoted/transferred from chair to bed  Level of Assistance Minimal assist, patient does 75% or more (+2)  Assistive Device Front wheel walker  Distance Ambulated (ft) 3 ft  Range of Motion/Exercises Active;All extremities  Activity Response Tolerated well  Mobility Referral Yes  Mobility visit 1 Mobility  Mobility Specialist Start Time (ACUTE ONLY) 1450  Mobility Specialist Stop Time (ACUTE ONLY) 1510  Mobility Specialist Time Calculation (min) (ACUTE ONLY) 20 min   Pt received in chair, requesting assistance back to bed. Required MinA+2 to stand and transfer with RW. Tolerated well, required many verbal cues d/t cognitive deficits. Alarm on, NT in room. All needs met.   Mykeal Carrick Mobility Specialist Please contact via Special Educational Needs Teacher or  Rehab office at [phone]

## 2024-05-26 NOTE — Progress Notes (Signed)
 Initial Nutrition Assessment  DOCUMENTATION CODES:   Severe malnutrition in context of chronic illness  INTERVENTION:  Continue Ensure Plus High Protein po BID, each supplement provides 350 kcal and 20 grams of protein Magic cup TID with meals, each supplement provides 290 kcal and 9 grams of protein MVI with minerals daily  NUTRITION DIAGNOSIS:   Severe Malnutrition related to chronic illness as evidenced by severe muscle depletion, severe fat depletion.  GOAL:   Patient will meet greater than or equal to 90% of their needs  MONITOR:   PO intake, Supplement acceptance  REASON FOR ASSESSMENT:   Malnutrition Screening Tool    ASSESSMENT:   84 yo female admitted with AMS. MRI head was suggestive of subacute small vessel infarct. PMH includes HTN, hypothyroidism, HLD, stroke, anemia, A fib, GERD, esophageal dilation.  Patient states that she has a poor appetite. She likes Ensure supplements. She is unable to state what her normal intake is like. Meal intakes documented at 50% since 1/28. She has been drinking Ensure supplements twice daily.   Labs reviewed.  Na 131 Mag 1.6 CBG: 150 (1/26)  Medications reviewed and include calcium  carbonate (TUMS), mag-ox, protonix .  Usual weights reviewed, patient with 7% weight loss in the past 5 months.   NUTRITION - FOCUSED PHYSICAL EXAM:  Flowsheet Row Most Recent Value  Orbital Region Severe depletion  Upper Arm Region Severe depletion  Thoracic and Lumbar Region Severe depletion  Buccal Region Severe depletion  Temple Region Severe depletion  Clavicle Bone Region Severe depletion  Clavicle and Acromion Bone Region Severe depletion  Scapular Bone Region Severe depletion  Dorsal Hand Severe depletion  Patellar Region Unable to assess  Anterior Thigh Region Unable to assess  Posterior Calf Region Unable to assess  [edematous]  Edema (RD Assessment) Moderate  Hair Reviewed  Eyes Reviewed  Mouth Reviewed  Skin Reviewed   Nails Reviewed    Diet Order:   Diet Order             Diet - low sodium heart healthy           Diet Heart Room service appropriate? Yes; Fluid consistency: Thin  Diet effective now                   EDUCATION NEEDS:   Not appropriate for education at this time  Skin:  Skin Assessment: Reviewed RN Assessment  Last BM:  1/30 type 6  Height:   Ht Readings from Last 1 Encounters:  05/18/24 5' 6 (1.676 m)    Weight:   Wt Readings from Last 1 Encounters:  05/18/24 57.9 kg    BMI:  Body mass index is 20.6 kg/m.  Estimated Nutritional Needs:   Kcal:  1450-1650  Protein:  70-80 gm  Fluid:  1.5 L   Suzen HUNT RD, LDN, CNSC Contact via secure chat. If unavailable, use group chat RD Inpatient.

## 2024-05-26 NOTE — NC FL2 (Signed)
 " [place]  MEDICAID FL2 LEVEL OF CARE FORM     IDENTIFICATION  Patient Name: [name] Birthdate: 1940-06-08 Sex: female Admission Date (Current Location): 05/18/2024  [place] Number:  [name] [id number] R Facility and Address:  [facility],  [address] [place] [address]      Provider Number: [id number]  Attending Physician Name and Address:  Darci Pore, MD  Relative Name and Phone Number:  [name], [phone]    Current Level of Care: Hospital Recommended Level of Care: Assisted Living Facility Prior Approval Number:    Date Approved/Denied:   PASRR Number: [id number]  Discharge Plan: Domiciliary (Rest home) (ALF)    Current Diagnoses: Patient Active Problem List   Diagnosis Date Noted   CVA (cerebral vascular accident) (HCC) 05/19/2024   SBO (small bowel obstruction) (HCC) 10/25/2023   Atrial fibrillation with controlled ventricular rate (HCC) 10/25/2023   Dyslipidemia 10/25/2023   Essential hypertension 10/25/2023   Depression 10/25/2023   Abnormal CT scan, esophagus 09/02/2023   AKI (acute kidney injury) 06/27/2022   Normocytic anemia 06/27/2022   Hypomagnesemia 08/09/2021   Pneumococcal bacteremia 08/08/2021   Anemia of chronic disease    Pneumococcal pneumonia 08/07/2021   Acute metabolic encephalopathy 08/07/2021   Acute respiratory failure with hypoxia (HCC) 08/07/2021   Septic shock (HCC) 08/07/2021   Hypokalemia 08/07/2021   COVID-19 virus infection 07/07/2021   CAP (community acquired pneumonia) 07/07/2021   Dysphagia 04/23/2021   Vitamin D deficiency 12/13/2020   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   Hypertension 01/01/2017   Hyperlipidemia 01/01/2017   Persistent atrial fibrillation (HCC) 01/01/2017   Hypothyroidism 01/01/2017   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   Lower extremity edema 01/01/2017    Orientation RESPIRATION BLADDER Height & Weight      Self, Place  Normal Incontinent Weight: 127 lb 10.3 oz (57.9 kg) Height:  5' 6 (167.6 cm)  BEHAVIORAL SYMPTOMS/MOOD NEUROLOGICAL BOWEL NUTRITION STATUS      Incontinent Diet (Heart Healthy)  AMBULATORY STATUS COMMUNICATION OF NEEDS Skin   Extensive Assist Verbally Normal                       Personal Care Assistance Level of Assistance  Bathing, Dressing, Feeding Bathing Assistance: Maximum assistance Feeding assistance: Limited assistance Dressing Assistance: Maximum assistance Total Care Assistance: Maximum assistance   Functional Limitations Info  Sight, Hearing, Speech Sight Info: Adequate Hearing Info: Impaired Speech Info: Adequate    SPECIAL CARE FACTORS FREQUENCY  PT (By licensed PT), OT (By licensed OT)     PT Frequency: 2x/wk OT Frequency: 2x/wk            Contractures Contractures Info: Not present    Additional Factors Info  Code Status, Allergies, Psychotropic Code Status Info: FULL Allergies Info: Sulfa antibiotics Psychotropic Info: Citalipram         Current Medications (05/26/2024):  This is the current hospital active medication list Current Facility-Administered Medications  Medication Dose Route Frequency Provider Last Rate Last Admin   acetaminophen  (TYLENOL ) tablet 650 mg  650 mg Oral Q6H PRN Adefeso, Oladapo, DO       Or   acetaminophen  (TYLENOL ) suppository 650 mg  650 mg Rectal Q6H PRN Adefeso, Oladapo, DO       apixaban  (ELIQUIS ) tablet 2.5 mg  2.5 mg Oral BID Melvenia Motto, MD   2.5 mg at 05/26/24 0919   atorvastatin  (LIPITOR) tablet 20 mg  20 mg Oral Daily Adefeso, Oladapo,  DO   20 mg at 05/26/24 0919   calcium  carbonate (TUMS - dosed in mg elemental calcium ) chewable tablet 200 mg of elemental calcium   1 tablet Oral BID WC Adefeso, Oladapo, DO   200 mg of elemental calcium  at 05/26/24 0920   cefTRIAXone  (ROCEPHIN ) 2 g in sodium chloride  0.9 % 100 mL IVPB  2 g Intravenous Q24H Darci Pore, MD 200 mL/hr at 05/25/24 1446 2  g at 05/25/24 1446   citalopram  (CELEXA ) tablet 5 mg  5 mg Oral QHS Adefeso, Oladapo, DO   5 mg at 05/24/24 2142   feeding supplement (ENSURE PLUS HIGH PROTEIN) liquid 237 mL  237 mL Oral BID BM Patsy Lenis, MD   237 mL at 05/25/24 1450   levothyroxine  (SYNTHROID ) tablet 112 mcg  112 mcg Oral QAC breakfast Melvenia Motto, MD   112 mcg at 05/26/24 9490   magnesium  oxide (MAG-OX) tablet 400 mg  400 mg Oral Daily Sreeram, Narendranath, MD   400 mg at 05/26/24 0920   metoprolol  tartrate (LOPRESSOR ) tablet 100 mg  100 mg Oral BID Melvenia Motto, MD   100 mg at 05/25/24 2107   ondansetron  (ZOFRAN ) tablet 4 mg  4 mg Oral Q6H PRN Adefeso, Oladapo, DO       Or   ondansetron  (ZOFRAN ) injection 4 mg  4 mg Intravenous Q6H PRN Adefeso, Oladapo, DO       oxyCODONE -acetaminophen  (PERCOCET/ROXICET) 5-325 MG per tablet 1 tablet  1 tablet Oral Q6H PRN Darci Pore, MD       pantoprazole  (PROTONIX ) EC tablet 40 mg  40 mg Oral Daily Melvenia Motto, MD   40 mg at 05/26/24 0920     Discharge Medications: TAKE these medications     Acidophilus Probiotic Blend Caps Take 1 capsule by mouth daily.    apixaban  2.5 MG Tabs tablet Commonly known as: ELIQUIS  Take 1 tablet (2.5 mg total) by mouth 2 (two) times daily.    atorvastatin  20 MG tablet Commonly known as: LIPITOR Take 20 mg by mouth daily.    Biofreeze 4 % Gel Generic drug: Menthol (Topical Analgesic) Apply 1 application topically See admin instructions. Apply to right shoulder twice a day    calcium  carbonate 500 MG chewable tablet Commonly known as: TUMS - dosed in mg elemental calcium  Chew 1 tablet by mouth 2 (two) times daily with a meal.    cephALEXin  250 MG capsule Commonly known as: KEFLEX  Take 1 capsule (250 mg total) by mouth 3 (three) times daily for 7 days.    Clobetasol Propionate 0.05 % shampoo Apply 1 application  topically See admin instructions. On shower day    escitalopram  5 MG tablet Commonly known as: LEXAPRO  Take 2.5 mg  by mouth at bedtime.    feeding supplement Liqd Take 237 mLs by mouth 2 (two) times daily between meals.    ferrous sulfate  325 (65 FE) MG tablet Take 325 mg by mouth 3 (three) times daily with meals.    furosemide  20 MG tablet Commonly known as: LASIX  Take 1 tablet (20 mg total) by mouth daily. What changed:  how much to take when to take this    levothyroxine  112 MCG tablet Commonly known as: SYNTHROID  Take 112 mcg by mouth daily before breakfast.    magnesium  oxide 400 (240 Mg) MG tablet Commonly known as: MAG-OX Take 1 tablet (400 mg total) by mouth daily. Start taking on: May 27, 2024    metoprolol  tartrate 25 MG tablet Commonly known as: LOPRESSOR  Take  0.5 tablets (12.5 mg total) by mouth 2 (two) times daily. What changed:  medication strength how much to take    mirtazapine  15 MG tablet Commonly known as: REMERON  Take 15 mg by mouth at bedtime.    omeprazole 20 MG capsule Commonly known as: PRILOSEC Take 20 mg by mouth daily.    OXYGEN Inhale 2 L into the lungs 2 (two) times daily as needed (Shortness of breath and when oxygen levels drops below 92%).    polyethylene glycol 17 g packet Commonly known as: MIRALAX  / GLYCOLAX  Take 17 g by mouth daily as needed for mild constipation or moderate constipation.    potassium chloride  SA 20 MEQ tablet Commonly known as: KLOR-CON  M Take 1 tablet (20 mEq total) by mouth daily.    promethazine 12.5 MG tablet Commonly known as: PHENERGAN Take 12.5 mg by mouth daily as needed for nausea or vomiting.    Salicylic Acid 50 % Soln by Does not apply route.    SYSTANE BALANCE OP Place 1 drop into both eyes 2 (two) times daily.    Relevant Imaging Results:  Relevant Lab Results:   Additional Information SSN: [id number]  Hoy DELENA Bigness, LCSW     "

## 2024-08-11 ENCOUNTER — Ambulatory Visit (HOSPITAL_COMMUNITY): Admitting: Cardiology

## 2024-08-13 ENCOUNTER — Ambulatory Visit (HOSPITAL_COMMUNITY): Admitting: Cardiology
